# Patient Record
Sex: Male | Born: 1945 | Race: White | Hispanic: No | Marital: Married | Smoking: Former smoker
Health system: Southern US, Community
[De-identification: ages and names within clinical notes are randomized; demographics above are authoritative.]

## PROBLEM LIST (undated history)

## (undated) DIAGNOSIS — L719 Rosacea, unspecified: Secondary | ICD-10-CM

## (undated) DIAGNOSIS — R251 Tremor, unspecified: Secondary | ICD-10-CM

## (undated) DIAGNOSIS — H5440 Blindness, one eye, unspecified eye: Secondary | ICD-10-CM

## (undated) DIAGNOSIS — K648 Other hemorrhoids: Secondary | ICD-10-CM

## (undated) DIAGNOSIS — D126 Benign neoplasm of colon, unspecified: Secondary | ICD-10-CM

## (undated) DIAGNOSIS — F32A Depression, unspecified: Secondary | ICD-10-CM

## (undated) DIAGNOSIS — M1990 Unspecified osteoarthritis, unspecified site: Secondary | ICD-10-CM

## (undated) DIAGNOSIS — J449 Chronic obstructive pulmonary disease, unspecified: Secondary | ICD-10-CM

## (undated) DIAGNOSIS — Z5189 Encounter for other specified aftercare: Secondary | ICD-10-CM

## (undated) DIAGNOSIS — T7840XA Allergy, unspecified, initial encounter: Secondary | ICD-10-CM

## (undated) DIAGNOSIS — F329 Major depressive disorder, single episode, unspecified: Secondary | ICD-10-CM

## (undated) DIAGNOSIS — I4892 Unspecified atrial flutter: Secondary | ICD-10-CM

## (undated) DIAGNOSIS — D509 Iron deficiency anemia, unspecified: Secondary | ICD-10-CM

## (undated) DIAGNOSIS — H409 Unspecified glaucoma: Secondary | ICD-10-CM

## (undated) DIAGNOSIS — K5790 Diverticulosis of intestine, part unspecified, without perforation or abscess without bleeding: Secondary | ICD-10-CM

## (undated) DIAGNOSIS — E785 Hyperlipidemia, unspecified: Secondary | ICD-10-CM

## (undated) DIAGNOSIS — E119 Type 2 diabetes mellitus without complications: Secondary | ICD-10-CM

## (undated) DIAGNOSIS — C3490 Malignant neoplasm of unspecified part of unspecified bronchus or lung: Secondary | ICD-10-CM

## (undated) DIAGNOSIS — R0600 Dyspnea, unspecified: Secondary | ICD-10-CM

## (undated) DIAGNOSIS — G4700 Insomnia, unspecified: Secondary | ICD-10-CM

## (undated) DIAGNOSIS — G4733 Obstructive sleep apnea (adult) (pediatric): Secondary | ICD-10-CM

## (undated) DIAGNOSIS — K644 Residual hemorrhoidal skin tags: Secondary | ICD-10-CM

## (undated) DIAGNOSIS — IMO0001 Reserved for inherently not codable concepts without codable children: Secondary | ICD-10-CM

## (undated) DIAGNOSIS — N529 Male erectile dysfunction, unspecified: Secondary | ICD-10-CM

## (undated) DIAGNOSIS — I10 Essential (primary) hypertension: Secondary | ICD-10-CM

## (undated) HISTORY — DX: Encounter for other specified aftercare: Z51.89

## (undated) HISTORY — DX: Obstructive sleep apnea (adult) (pediatric): G47.33

## (undated) HISTORY — DX: Hyperlipidemia, unspecified: E78.5

## (undated) HISTORY — DX: Residual hemorrhoidal skin tags: K64.8

## (undated) HISTORY — DX: Type 2 diabetes mellitus without complications: E11.9

## (undated) HISTORY — DX: Blindness, one eye, unspecified eye: H54.40

## (undated) HISTORY — DX: Unspecified osteoarthritis, unspecified site: M19.90

## (undated) HISTORY — DX: Unspecified glaucoma: H40.9

## (undated) HISTORY — DX: Diverticulosis of intestine, part unspecified, without perforation or abscess without bleeding: K57.90

## (undated) HISTORY — PX: CLAVICLE SURGERY: SHX598

## (undated) HISTORY — DX: Benign neoplasm of colon, unspecified: D12.6

## (undated) HISTORY — DX: Major depressive disorder, single episode, unspecified: F32.9

## (undated) HISTORY — DX: Rosacea, unspecified: L71.9

## (undated) HISTORY — DX: Insomnia, unspecified: G47.00

## (undated) HISTORY — DX: Iron deficiency anemia, unspecified: D50.9

## (undated) HISTORY — PX: TONSILLECTOMY: SUR1361

## (undated) HISTORY — DX: Depression, unspecified: F32.A

## (undated) HISTORY — DX: Allergy, unspecified, initial encounter: T78.40XA

## (undated) HISTORY — DX: Chronic obstructive pulmonary disease, unspecified: J44.9

## (undated) HISTORY — DX: Essential (primary) hypertension: I10

## (undated) HISTORY — PX: THYROID CYST EXCISION: SHX2511

## (undated) HISTORY — DX: Male erectile dysfunction, unspecified: N52.9

## (undated) HISTORY — PX: CHOLECYSTECTOMY: SHX55

## (undated) HISTORY — DX: Residual hemorrhoidal skin tags: K64.4

## (undated) HISTORY — DX: Reserved for inherently not codable concepts without codable children: IMO0001

## (undated) HISTORY — DX: Tremor, unspecified: R25.1

## (undated) HISTORY — PX: ARTHROSCOPIC REPAIR ACL: SUR80

## (undated) MED FILL — Dexamethasone Sodium Phosphate Inj 100 MG/10ML: INTRAMUSCULAR | Qty: 1 | Status: AC

---

## 1994-02-24 HISTORY — PX: KNEE ARTHROSCOPY: SUR90

## 1999-01-03 ENCOUNTER — Encounter: Payer: Self-pay | Admitting: Family Medicine

## 1999-01-03 ENCOUNTER — Encounter: Admission: RE | Admit: 1999-01-03 | Discharge: 1999-01-03 | Payer: Self-pay | Admitting: Family Medicine

## 1999-01-24 ENCOUNTER — Encounter: Payer: Self-pay | Admitting: Urology

## 1999-01-24 ENCOUNTER — Ambulatory Visit (HOSPITAL_COMMUNITY): Admission: RE | Admit: 1999-01-24 | Discharge: 1999-01-24 | Payer: Self-pay | Admitting: Urology

## 2001-01-22 ENCOUNTER — Encounter: Admission: RE | Admit: 2001-01-22 | Discharge: 2001-01-22 | Payer: Self-pay | Admitting: Family Medicine

## 2001-01-22 ENCOUNTER — Encounter: Payer: Self-pay | Admitting: Family Medicine

## 2001-03-19 ENCOUNTER — Ambulatory Visit (HOSPITAL_COMMUNITY): Admission: RE | Admit: 2001-03-19 | Discharge: 2001-03-19 | Payer: Self-pay | Admitting: Gastroenterology

## 2001-03-19 ENCOUNTER — Encounter: Payer: Self-pay | Admitting: Gastroenterology

## 2001-11-16 ENCOUNTER — Inpatient Hospital Stay (HOSPITAL_COMMUNITY): Admission: EM | Admit: 2001-11-16 | Discharge: 2001-11-19 | Payer: Self-pay | Admitting: Emergency Medicine

## 2001-11-16 ENCOUNTER — Encounter: Payer: Self-pay | Admitting: Internal Medicine

## 2001-11-17 ENCOUNTER — Encounter: Payer: Self-pay | Admitting: Cardiology

## 2001-11-18 ENCOUNTER — Encounter: Payer: Self-pay | Admitting: Internal Medicine

## 2001-11-18 ENCOUNTER — Encounter (INDEPENDENT_AMBULATORY_CARE_PROVIDER_SITE_OTHER): Payer: Self-pay | Admitting: Specialist

## 2001-11-19 ENCOUNTER — Encounter: Payer: Self-pay | Admitting: Internal Medicine

## 2002-01-12 ENCOUNTER — Encounter: Payer: Self-pay | Admitting: Cardiology

## 2002-01-12 ENCOUNTER — Ambulatory Visit (HOSPITAL_COMMUNITY): Admission: RE | Admit: 2002-01-12 | Discharge: 2002-01-12 | Payer: Self-pay | Admitting: Cardiology

## 2002-05-13 ENCOUNTER — Ambulatory Visit (HOSPITAL_COMMUNITY): Admission: RE | Admit: 2002-05-13 | Discharge: 2002-05-13 | Payer: Self-pay | Admitting: Gastroenterology

## 2002-05-13 ENCOUNTER — Encounter: Payer: Self-pay | Admitting: Gastroenterology

## 2002-11-22 ENCOUNTER — Encounter: Admission: RE | Admit: 2002-11-22 | Discharge: 2002-11-22 | Payer: Self-pay | Admitting: Family Medicine

## 2002-11-22 ENCOUNTER — Encounter: Payer: Self-pay | Admitting: Family Medicine

## 2004-01-24 ENCOUNTER — Ambulatory Visit: Payer: Self-pay | Admitting: Gastroenterology

## 2004-06-13 ENCOUNTER — Ambulatory Visit: Payer: Self-pay | Admitting: Gastroenterology

## 2004-06-21 ENCOUNTER — Ambulatory Visit: Payer: Self-pay | Admitting: Gastroenterology

## 2004-07-04 ENCOUNTER — Ambulatory Visit: Payer: Self-pay | Admitting: Gastroenterology

## 2004-07-18 ENCOUNTER — Ambulatory Visit (HOSPITAL_COMMUNITY): Admission: RE | Admit: 2004-07-18 | Discharge: 2004-07-18 | Payer: Self-pay | Admitting: Gastroenterology

## 2004-07-18 ENCOUNTER — Ambulatory Visit: Payer: Self-pay | Admitting: Gastroenterology

## 2004-08-07 ENCOUNTER — Ambulatory Visit: Payer: Self-pay | Admitting: Gastroenterology

## 2004-08-20 ENCOUNTER — Encounter: Admission: RE | Admit: 2004-08-20 | Discharge: 2004-08-20 | Payer: Self-pay | Admitting: Family Medicine

## 2004-08-20 IMAGING — CT CT ABDOMEN W/ CM
1 of 2 series · 14 of 32 positions shown, 19 images · IV contrast (GASTRO & WATER & [ID] OMNI 300)
Comparison: [DATE].

CLINICAL DATA: Left abdominal pain for three weeks.  Recent fall.  Question muscle strain.
ABDOMEN CT WITH CONTRAST:
TECHNIQUE: Multidetector CT imaging of the abdomen was performed following the standard protocol with oral contrast and during bolus administration of intravenous contrast.
Contrast:  Oral contrast and 125 cc Omnipaque 300

[Series 2: abd w/ · axial · 0.78mm/px · z∈[-373,-88]mm · 14 of 65 slices shown, 19 images]
[im 4/65  soft-tissue]
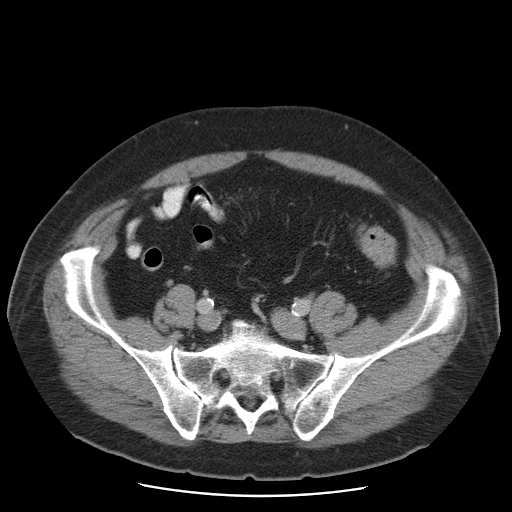
[im 4/65  bone]
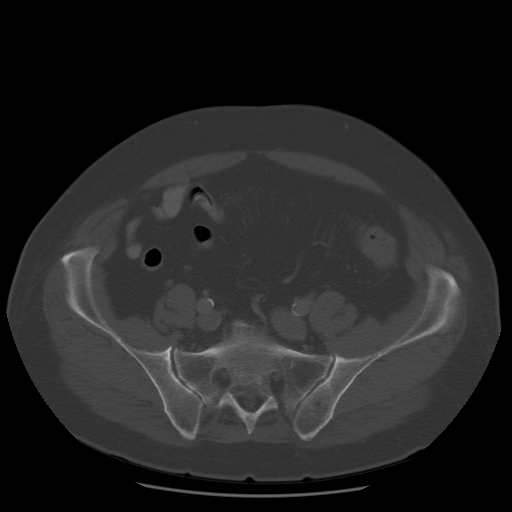
[im 10/65  soft-tissue]
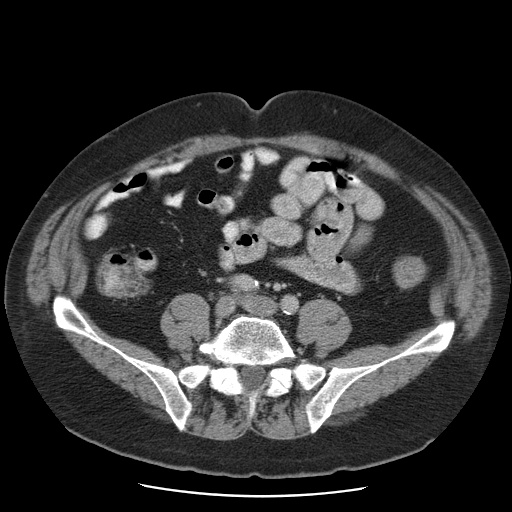
[im 13/65  soft-tissue]
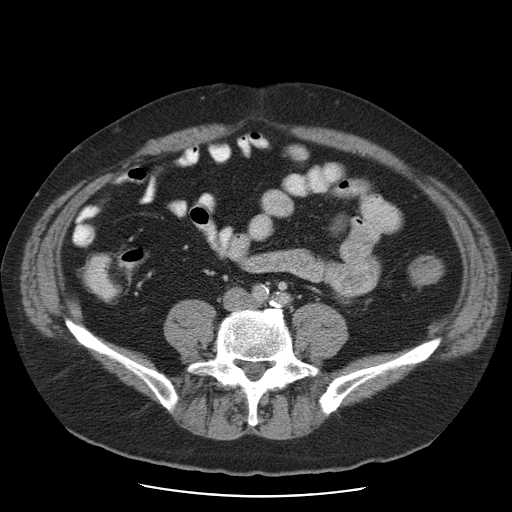
[im 20/65  soft-tissue]
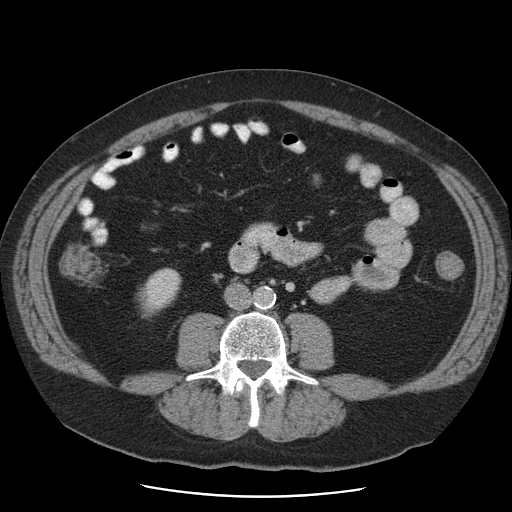
[im 23/65  soft-tissue]
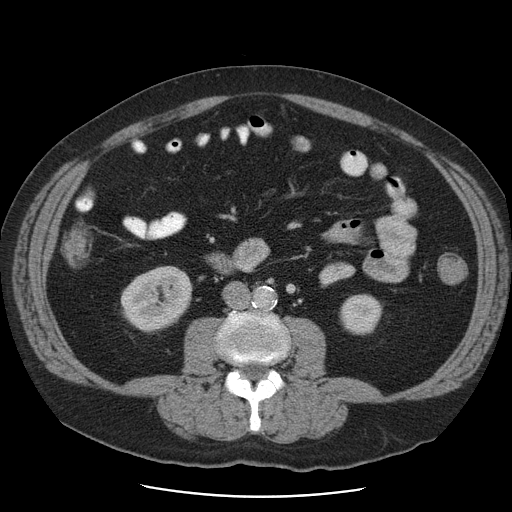
[im 29/65  soft-tissue]
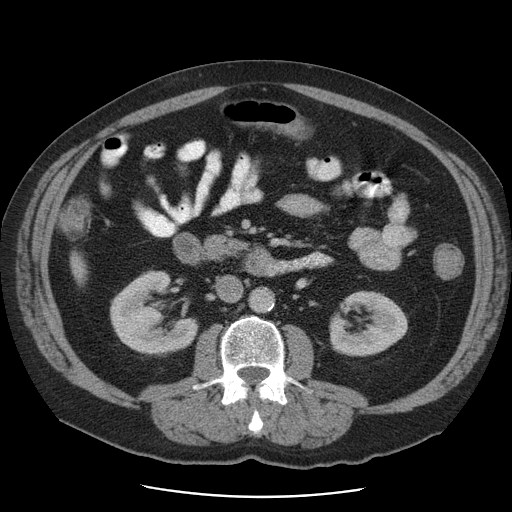
[im 33/65  soft-tissue]
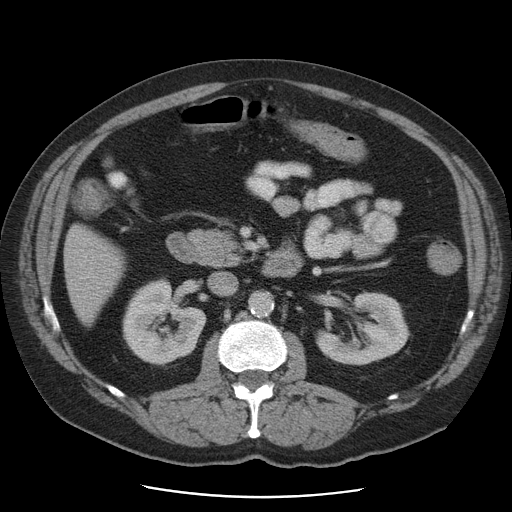
[im 36/65  soft-tissue]
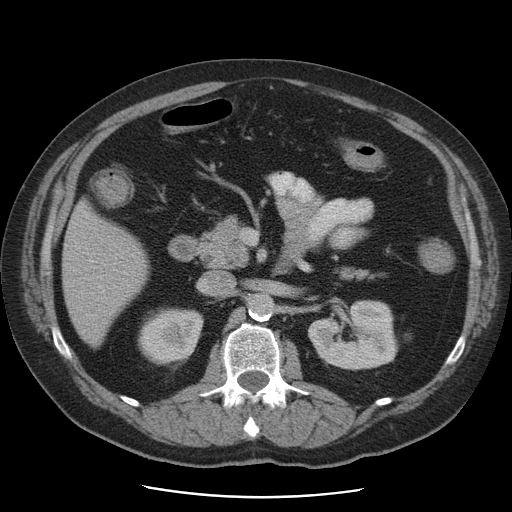
[im 42/65  soft-tissue]
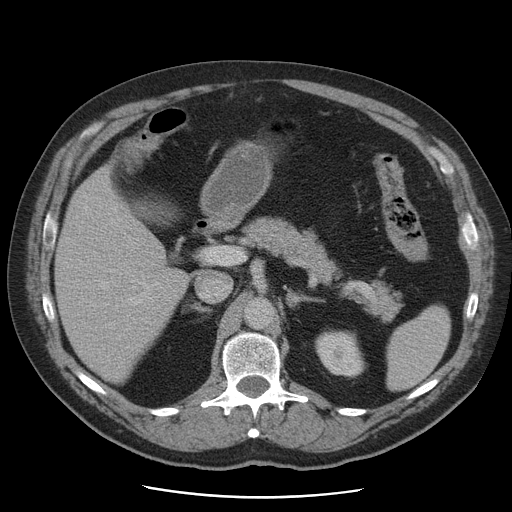
[im 42/65  bone]
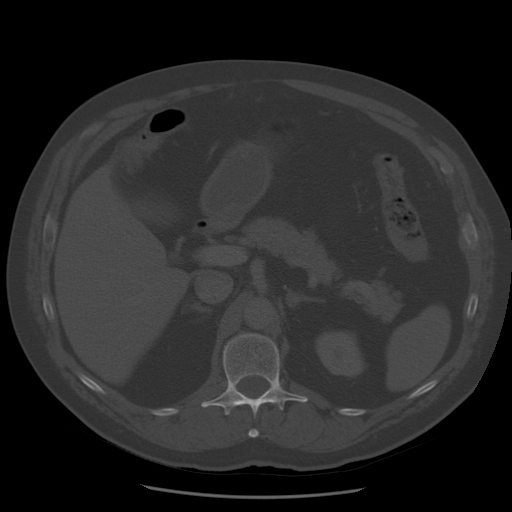
[im 45/65  soft-tissue]
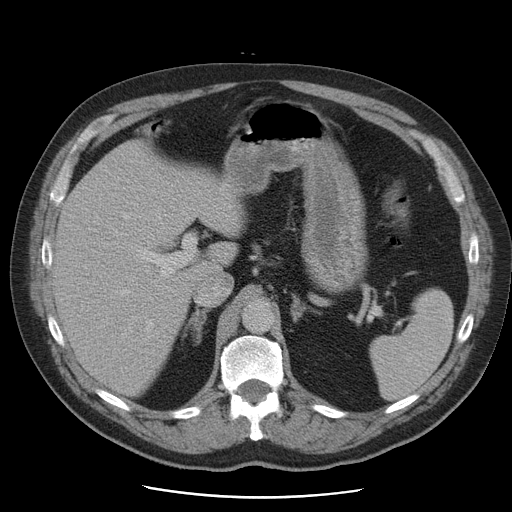
[im 52/65  soft-tissue]
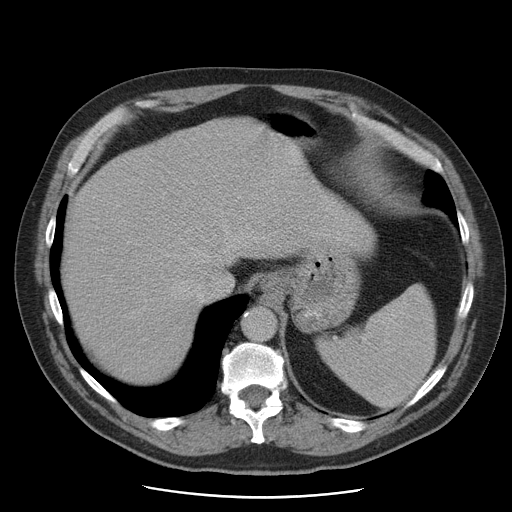
[im 52/65  lung]
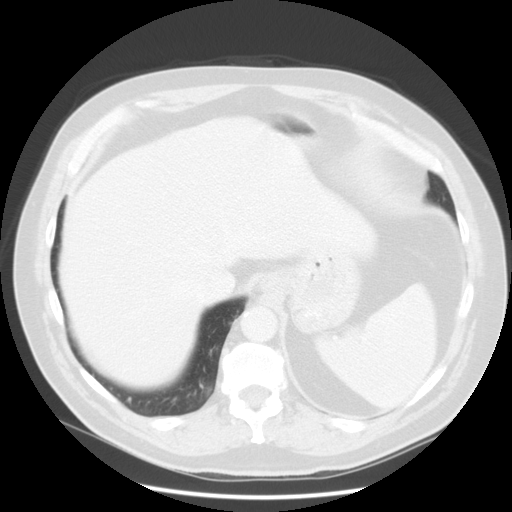
[im 55/65  soft-tissue]
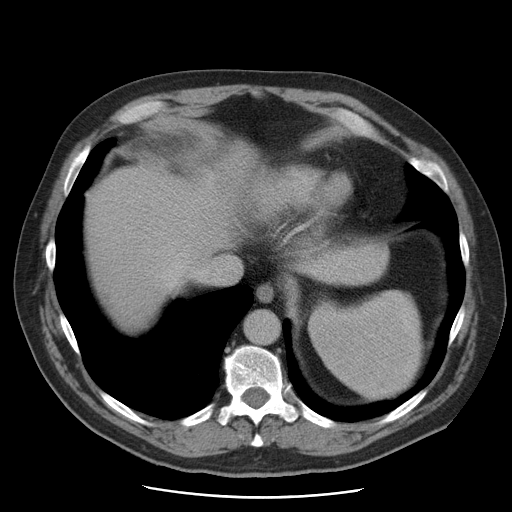
[im 55/65  lung]
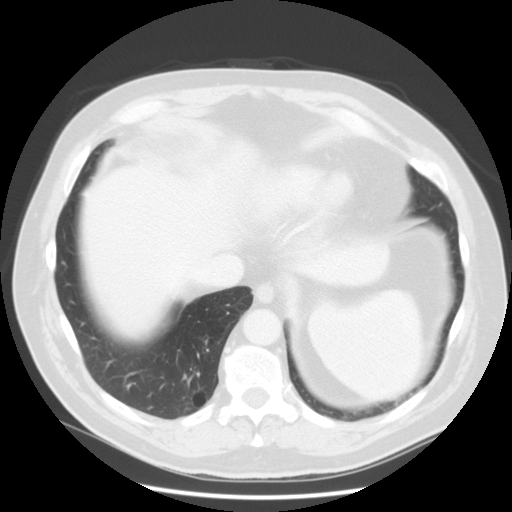
[im 58/65  lung]
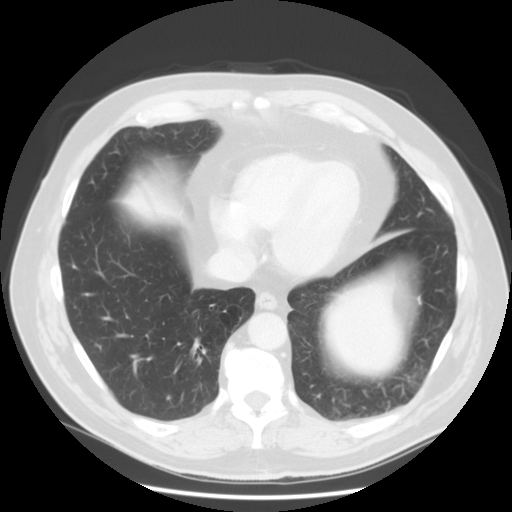
[im 61/65  soft-tissue]
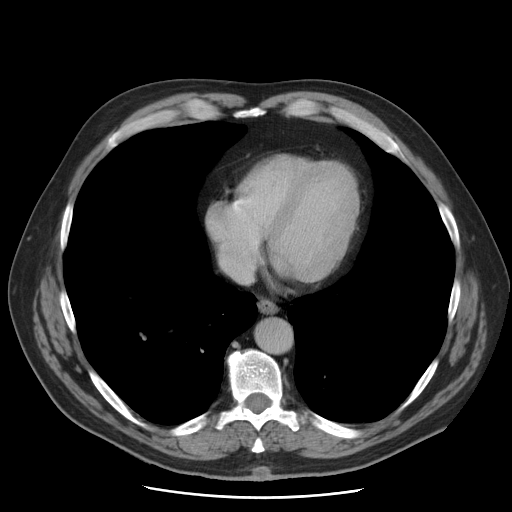
[im 61/65  lung]
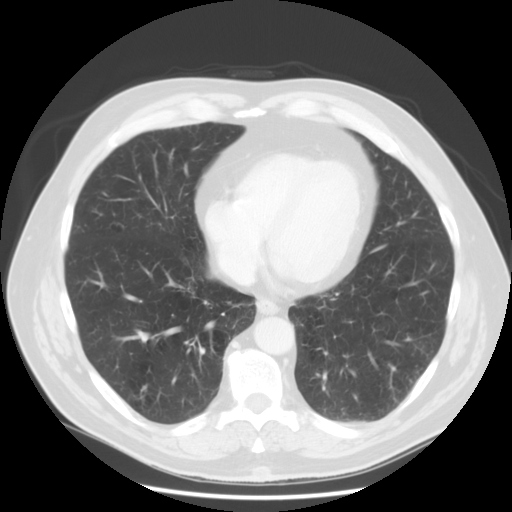

[14 of 32 positions shown; findings below may reference images not displayed]

FINDINGS: The lung bases are clear.  There is no pleural effusion.  Small bilateral adrenal adenomas are stable from the prior exam.  There is no biliary dilatation status-post cholecystectomy.  The liver, spleen, pancreas, and kidneys appear stable with a small cortical cyst projecting laterally from the midleft kidney, measuring 1.7 cm in diameter.  There is no adenopathy or ascites.  No anterior abdominal wall hematoma or intraabdominal inflammatory change is seen.  Images extend into the false pelvis and demonstrate diverticular changes of the sigmoid colon without surrounding inflammation.  Moderate lumbar spine degenerative changes are noted.
IMPRESSION: 1.  No definite explanation for left abdominal pain is seen.  There is sigmoid diverticulosis without evidence of acute inflammation.  
2.  Stable bilateral adrenal adenomas and small left renal cortical cyst.

## 2004-09-20 ENCOUNTER — Ambulatory Visit: Payer: Self-pay | Admitting: Gastroenterology

## 2004-11-27 ENCOUNTER — Ambulatory Visit: Payer: Self-pay | Admitting: Gastroenterology

## 2005-01-03 ENCOUNTER — Ambulatory Visit: Payer: Self-pay | Admitting: Gastroenterology

## 2006-02-09 ENCOUNTER — Ambulatory Visit: Payer: Self-pay | Admitting: Cardiology

## 2006-02-09 ENCOUNTER — Observation Stay (HOSPITAL_COMMUNITY): Admission: EM | Admit: 2006-02-09 | Discharge: 2006-02-10 | Payer: Self-pay | Admitting: Emergency Medicine

## 2006-02-09 IMAGING — CR DG CHEST 1V PORT
1 series · 1 of 1 positions shown · non-contrast
Comparison: None available.

CLINICAL DATA: Chest pain, smoker, shortness of breath. 
 PORTABLE CHEST - 1 VIEW ? [DATE]:

[view not recorded]
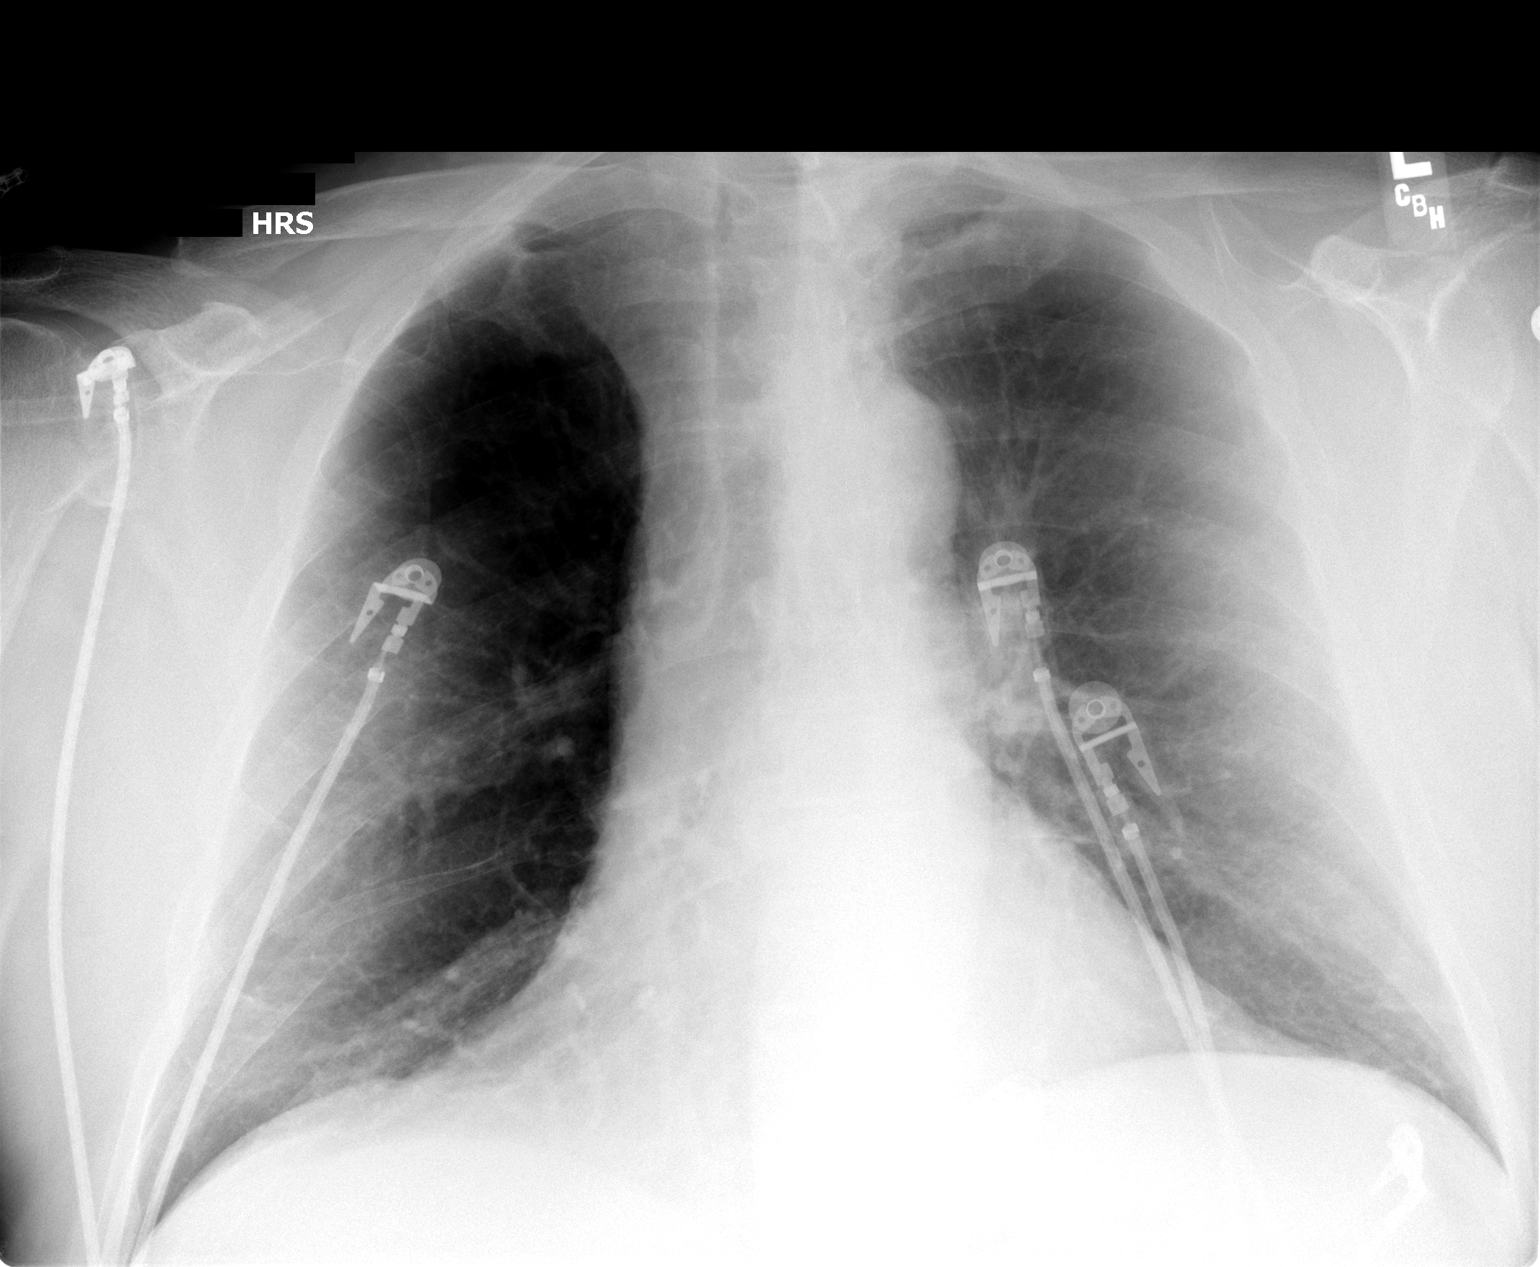

[1 of 1 positions shown; findings below may reference images not displayed]

FINDINGS: The trachea is midline.  Heart size is within normal limits.  COPD.  Prominent vessels are seen in the right mid lung zone.  The lungs are otherwise clear.
IMPRESSION: No acute findings.

## 2006-02-10 ENCOUNTER — Encounter: Payer: Self-pay | Admitting: Cardiology

## 2006-02-24 HISTORY — PX: PARTIAL COLECTOMY: SHX5273

## 2006-03-09 ENCOUNTER — Ambulatory Visit: Payer: Self-pay | Admitting: Cardiology

## 2006-07-16 ENCOUNTER — Ambulatory Visit: Payer: Self-pay | Admitting: Gastroenterology

## 2006-07-30 ENCOUNTER — Ambulatory Visit: Payer: Self-pay | Admitting: Gastroenterology

## 2006-07-30 ENCOUNTER — Encounter (INDEPENDENT_AMBULATORY_CARE_PROVIDER_SITE_OTHER): Payer: Self-pay | Admitting: Gastroenterology

## 2006-09-24 ENCOUNTER — Ambulatory Visit: Payer: Self-pay | Admitting: Gastroenterology

## 2006-10-06 ENCOUNTER — Ambulatory Visit: Payer: Self-pay | Admitting: Gastroenterology

## 2006-11-24 ENCOUNTER — Ambulatory Visit: Admission: RE | Admit: 2006-11-24 | Discharge: 2006-11-24 | Payer: Self-pay | Admitting: *Deleted

## 2006-11-27 ENCOUNTER — Encounter (INDEPENDENT_AMBULATORY_CARE_PROVIDER_SITE_OTHER): Payer: Self-pay | Admitting: *Deleted

## 2006-11-27 ENCOUNTER — Inpatient Hospital Stay (HOSPITAL_COMMUNITY): Admission: RE | Admit: 2006-11-27 | Discharge: 2006-12-04 | Payer: Self-pay | Admitting: Orthopedic Surgery

## 2006-12-03 IMAGING — CR DG CHEST 2V
2 series · 2 of 2 positions shown · non-contrast
Comparison: [DATE].

CLINICAL DATA: 61 year-old-male with left colon mass, abdominal pain, partial colectomy [DATE].  History of smoking. 
CHEST - 2 VIEW ? [DATE]:

[w chest pa]
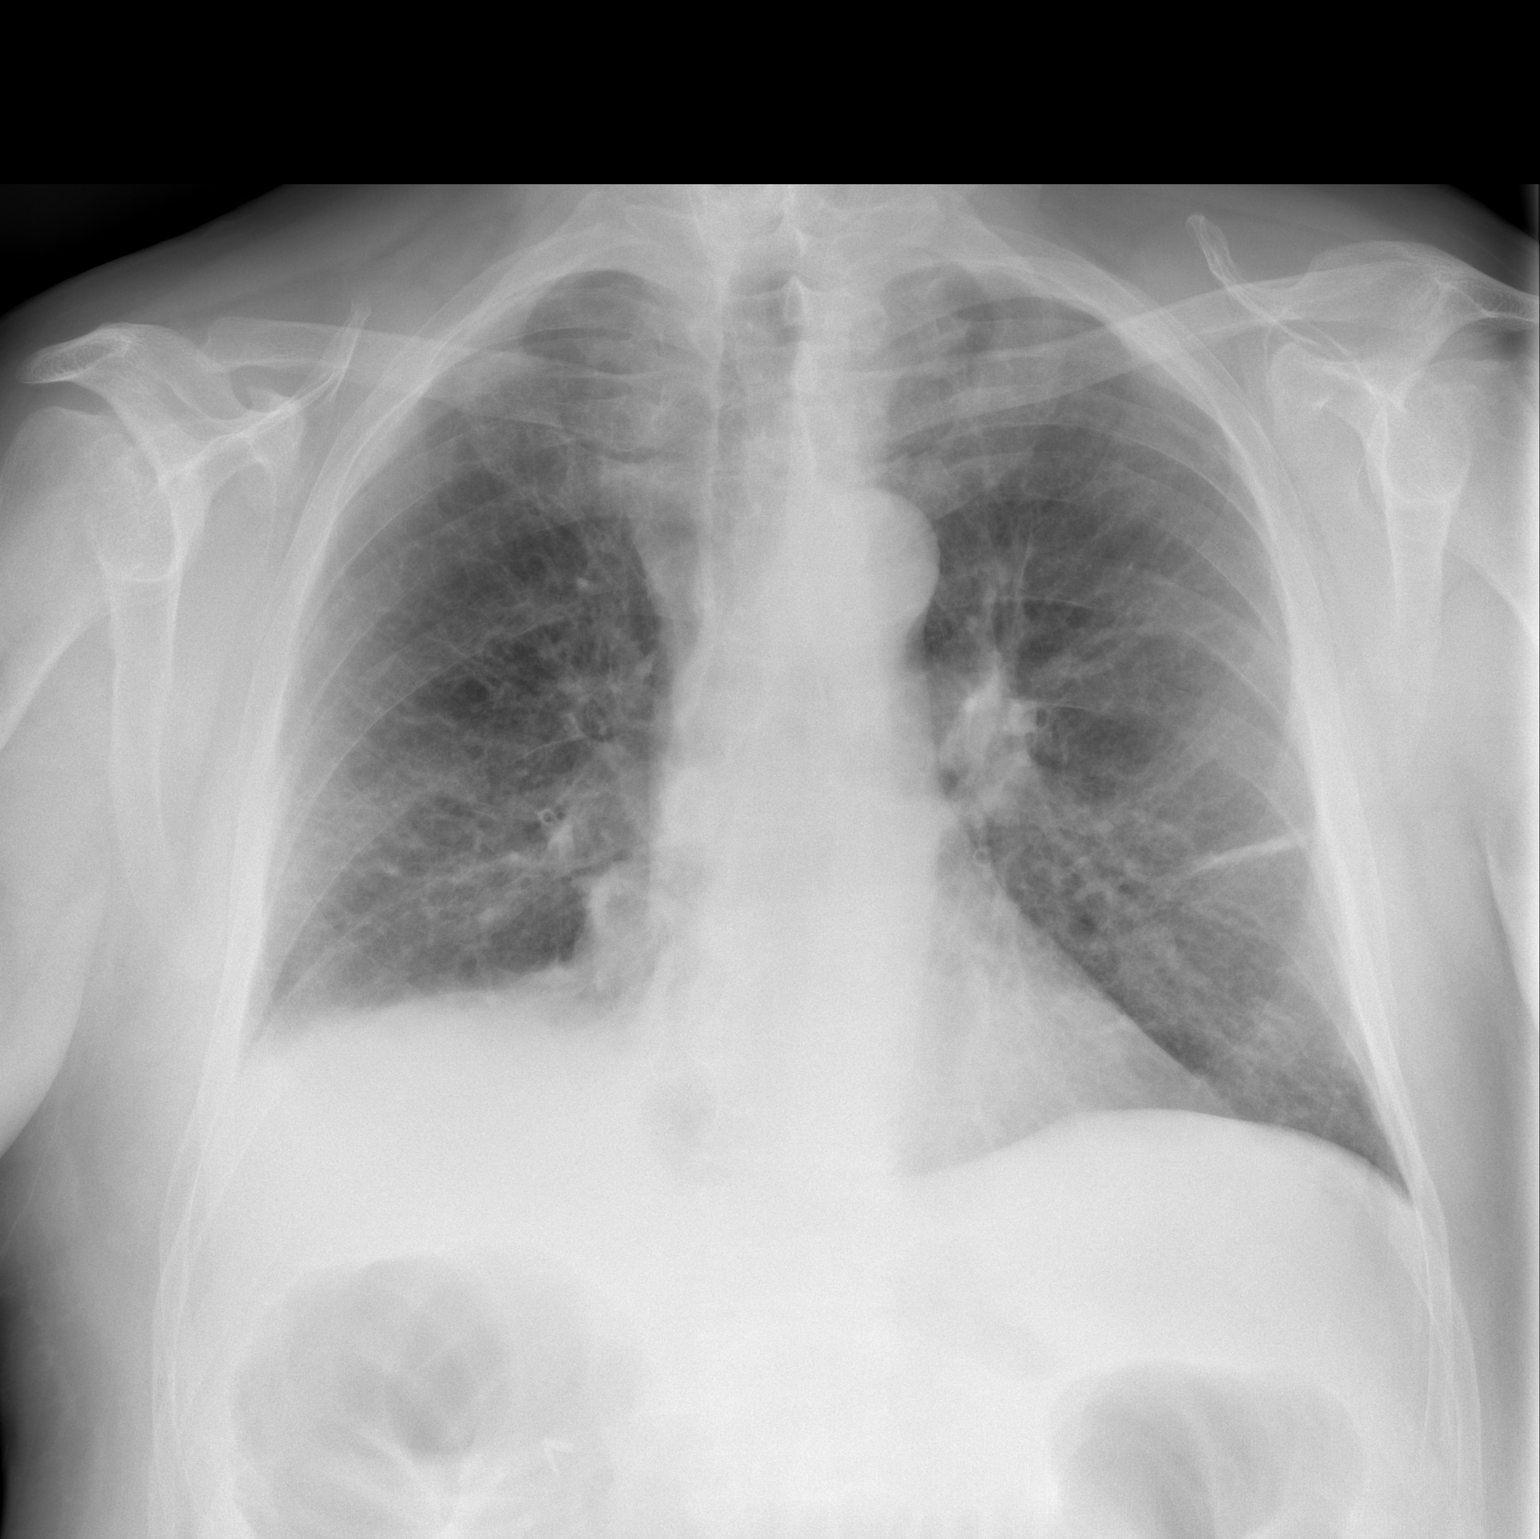

[w chest lat *]
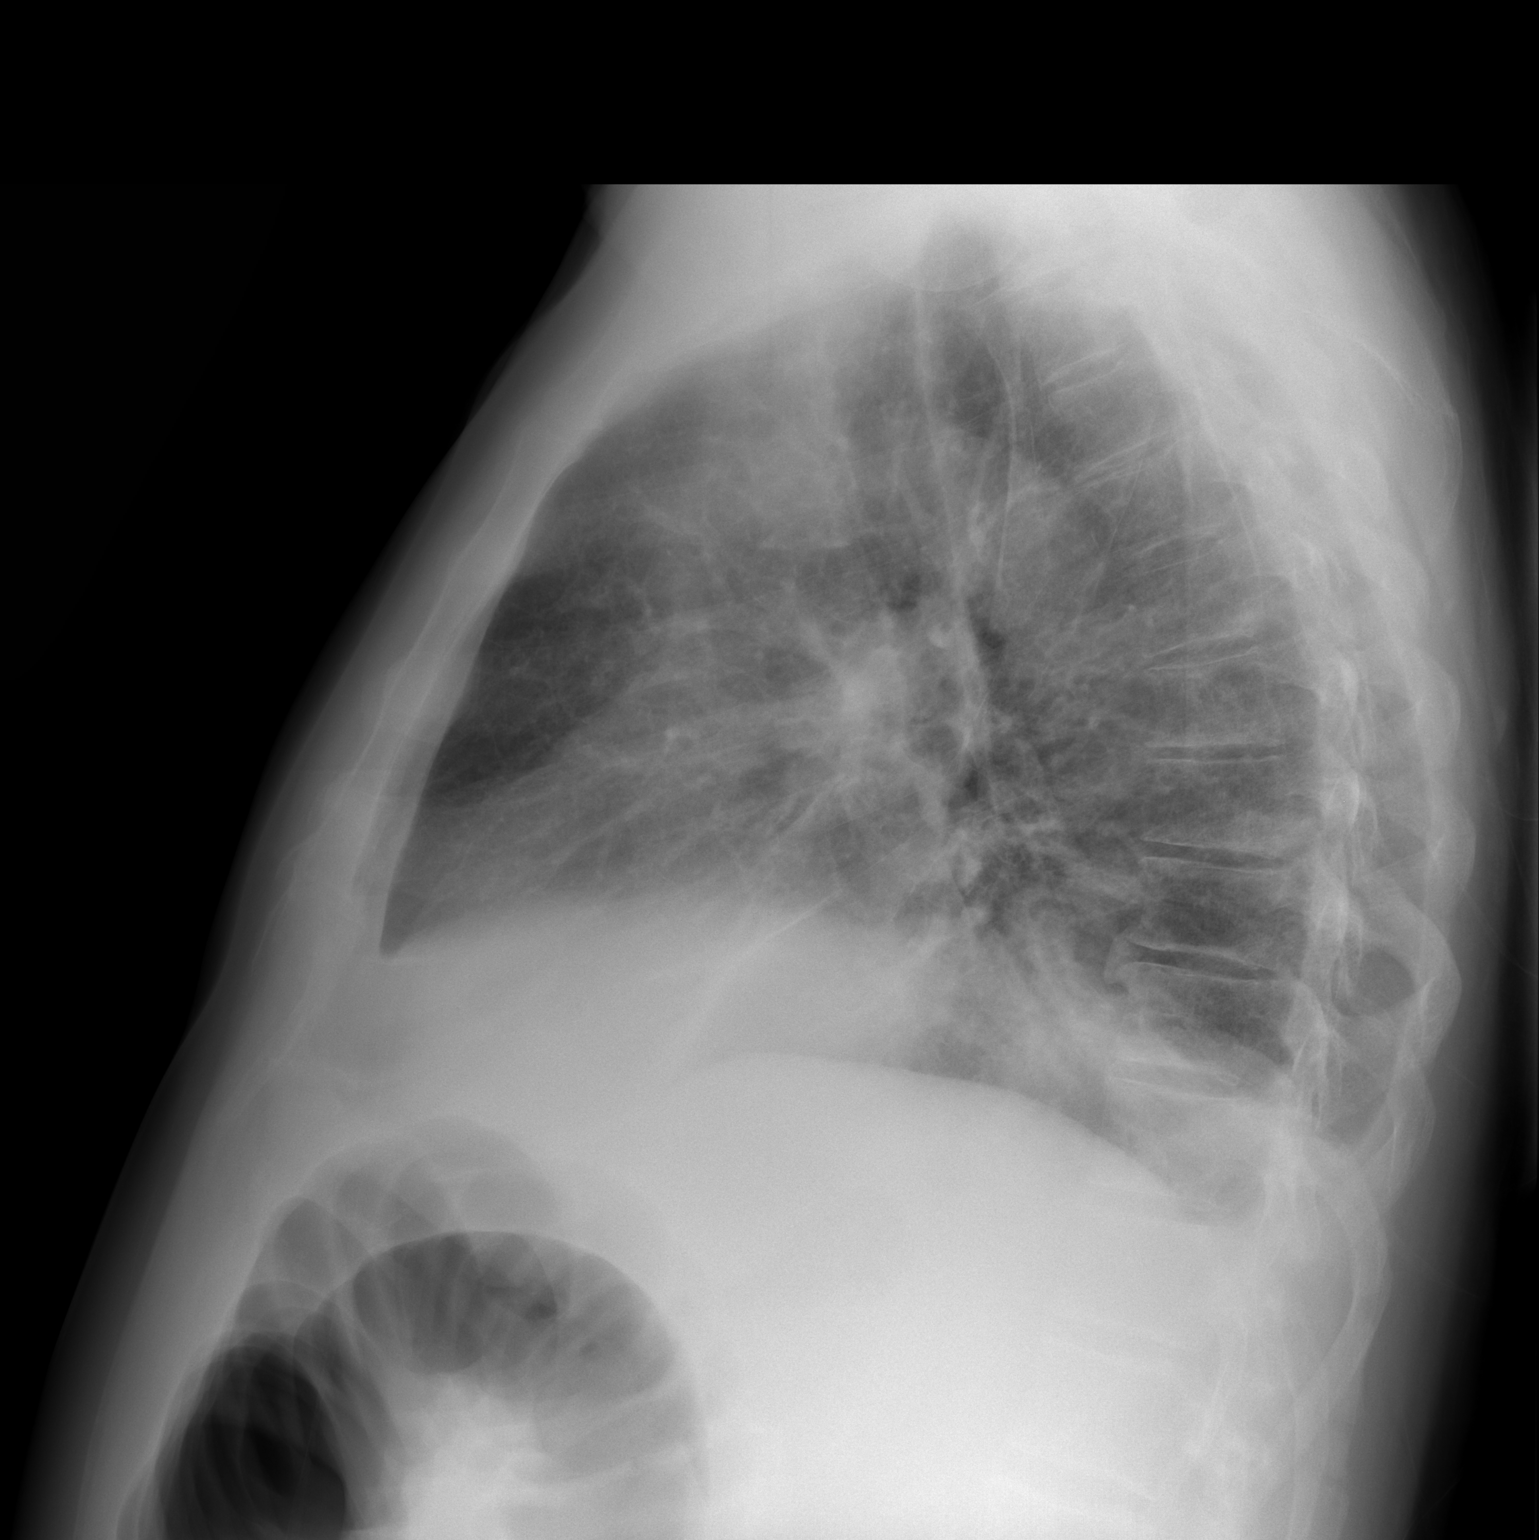

[2 of 2 positions shown; findings below may reference images not displayed]

FINDINGS: Slightly decreased lung volumes.  Background chronic bronchial thickening and COPD type changes are identified.  Left midlung subsegmental atelectasis.  Hazy density of the left lung base likely represents atelectasis with an elevated right hemidiaphragm.  No pneumothorax.  A small right effusion is not excluded. Distended bowel is present in the upper abdomen.
IMPRESSION: 1.  Low volume exam with COPD changes and chronic bronchial thickening. 
2.  Left mid lung and right lower lobe atelectasis.

## 2007-02-01 ENCOUNTER — Ambulatory Visit: Payer: Self-pay | Admitting: Gastroenterology

## 2007-02-02 ENCOUNTER — Ambulatory Visit: Payer: Self-pay | Admitting: Gastroenterology

## 2007-02-02 ENCOUNTER — Encounter: Payer: Self-pay | Admitting: Gastroenterology

## 2007-02-05 ENCOUNTER — Ambulatory Visit: Payer: Self-pay | Admitting: Internal Medicine

## 2007-02-05 IMAGING — CT CT ABDOMEN W/ CM
2 of 5 series · 17 of 46 positions shown, 19 images · IV contrast (Omnipaque 300)
Comparison: CT abdomen of [DATE].

CLINICAL DATA: left lower quadrant pain for several days. Colon resection in [DATE] for a large polyp.
ABDOMEN CT WITH CONTRAST:
TECHNIQUE: Multidetector CT imaging of the abdomen was performed following the standard protocol during bolus administration of intravenous contrast.
Contrast:  125 cc Omnipaque 300
TECHNIQUE: Multidetector CT imaging of the pelvis was performed following the standard protocol during bolus administration of intravenous contrast.

[Series 2: abd_pel 5.0 b30f st · axial · 0.94mm/px · z∈[-476,-56]mm · 14 of 94 slices shown, 16 images]
[im 5/94  soft-tissue]
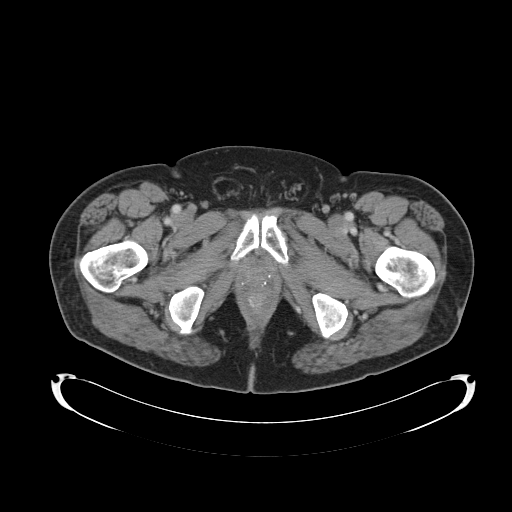
[im 5/94  bone]
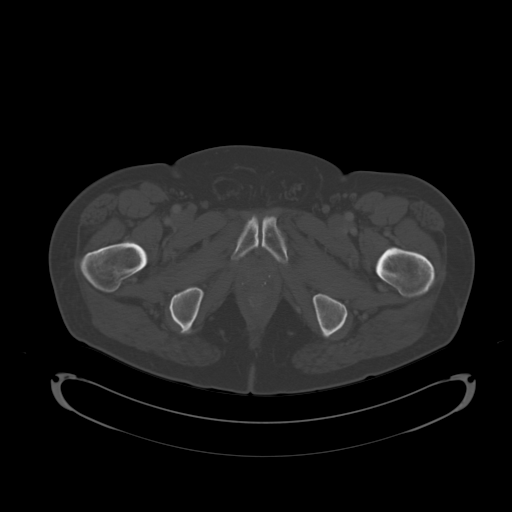
[im 14/94  soft-tissue]
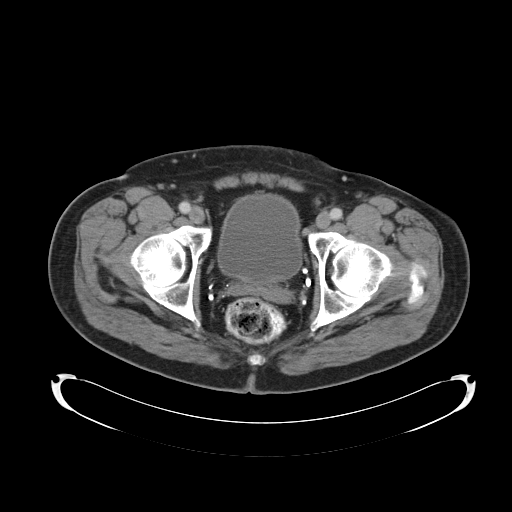
[im 19/94  soft-tissue]
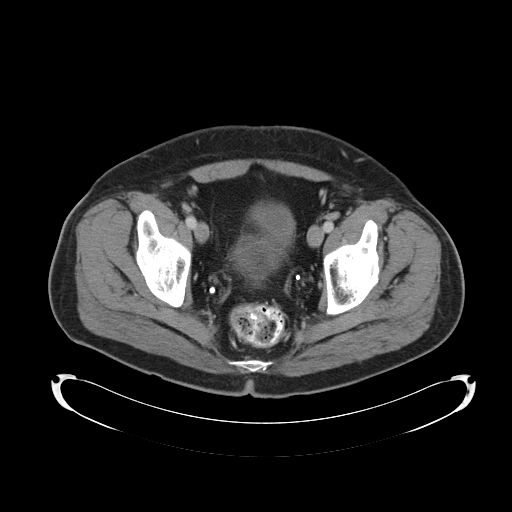
[im 24/94  soft-tissue]
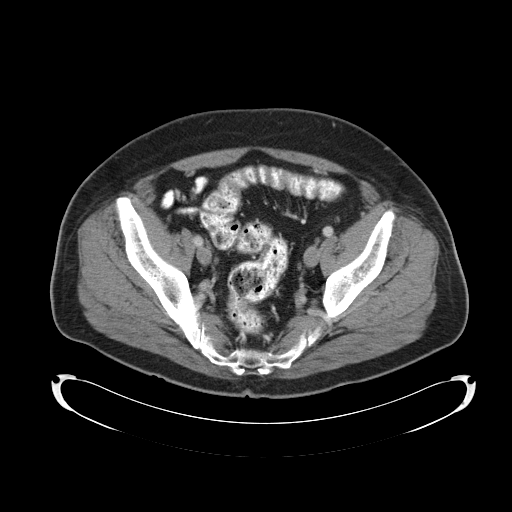
[im 33/94  soft-tissue]
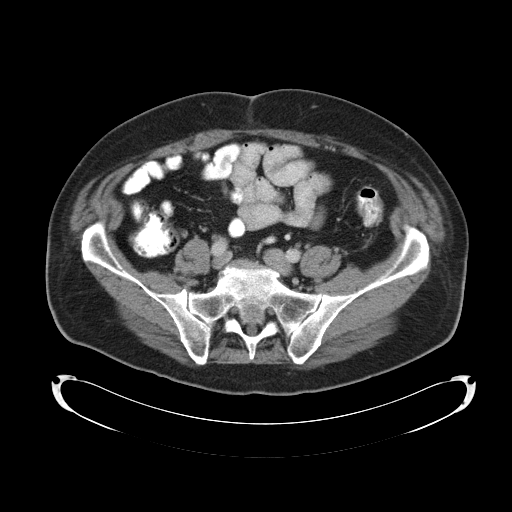
[im 38/94  soft-tissue]
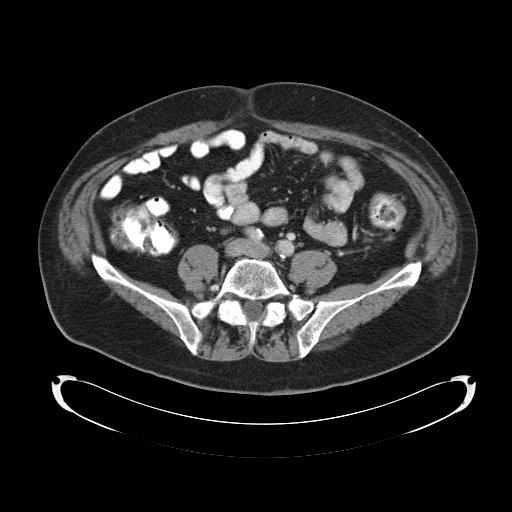
[im 42/94  soft-tissue]
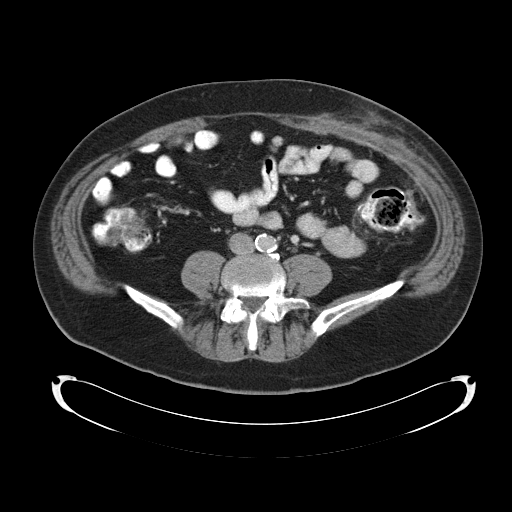
[im 52/94  soft-tissue]
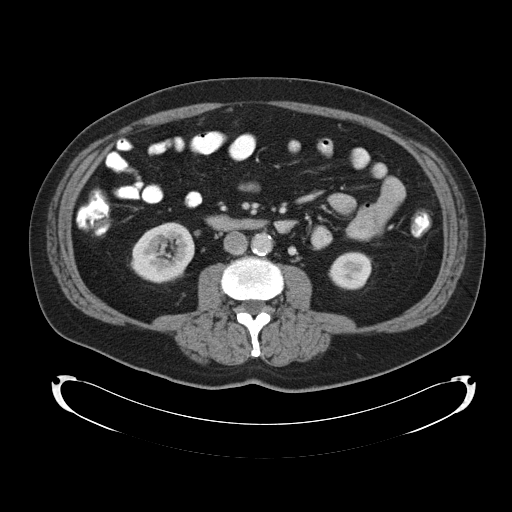
[im 56/94  soft-tissue]
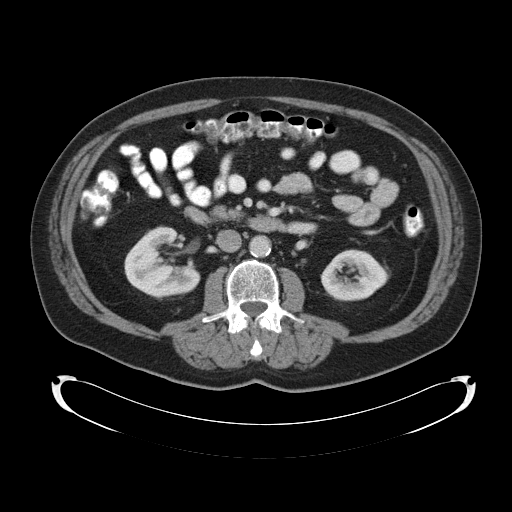
[im 56/94  bone]
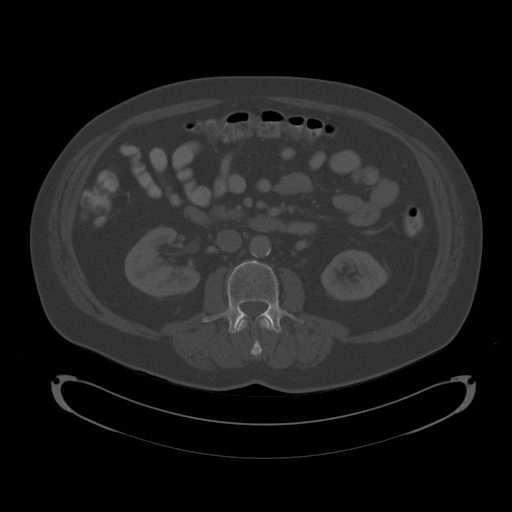
[im 61/94  soft-tissue]
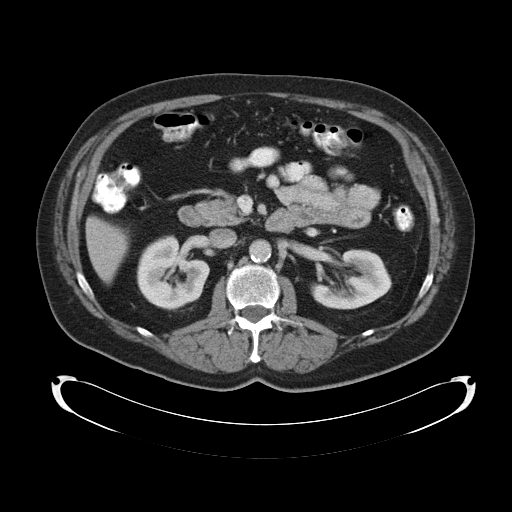
[im 70/94  soft-tissue]
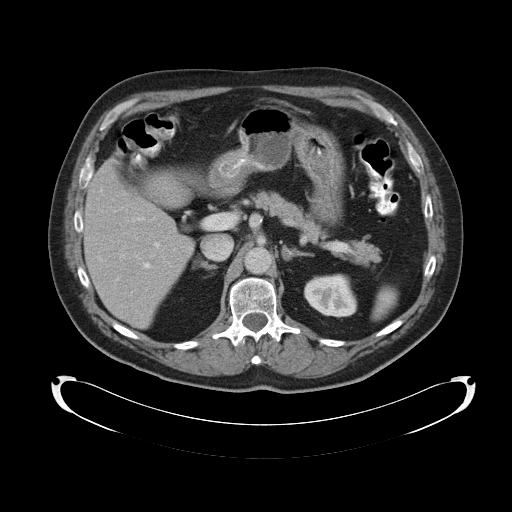
[im 75/94  soft-tissue]
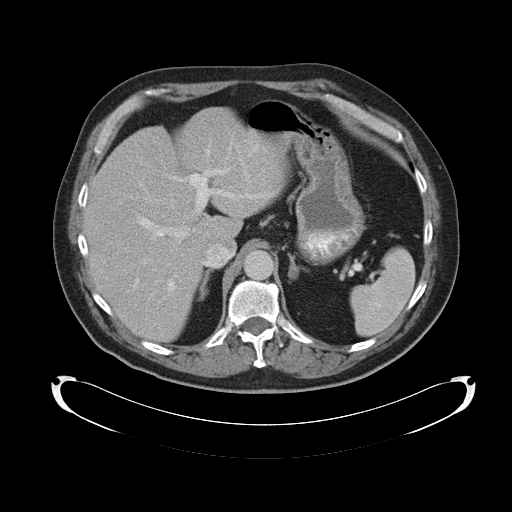
[im 80/94  soft-tissue]
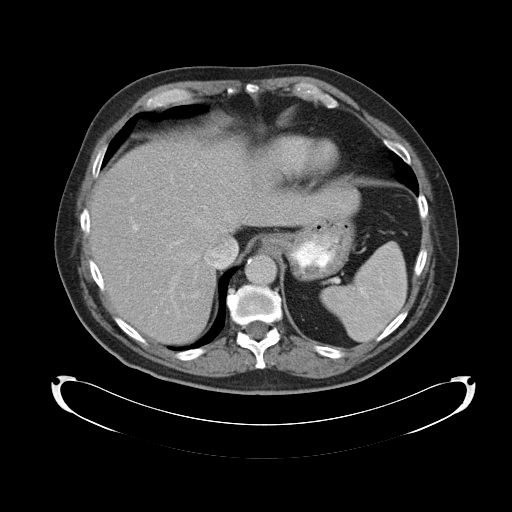
[im 89/94  soft-tissue]
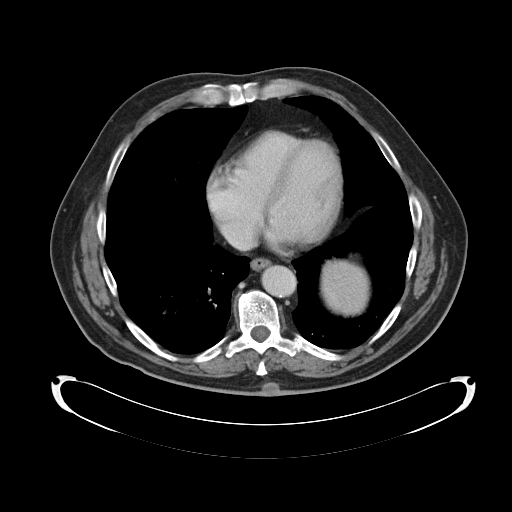

[Series 602: <mpr thick range> · coronal · 0.94mm/px · 3 of 110 slices shown]
[im 37/110  soft-tissue]
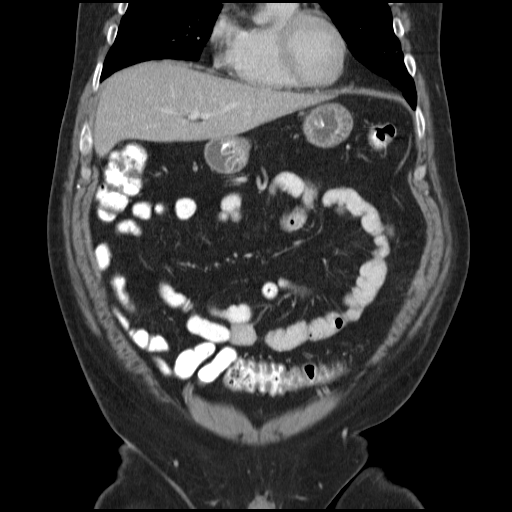
[im 49/110  soft-tissue]
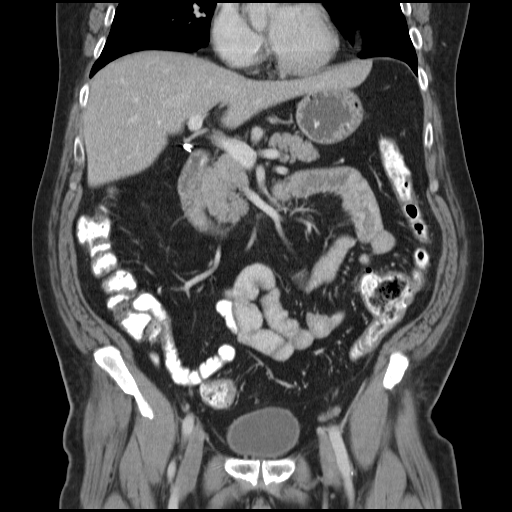
[im 61/110  soft-tissue]
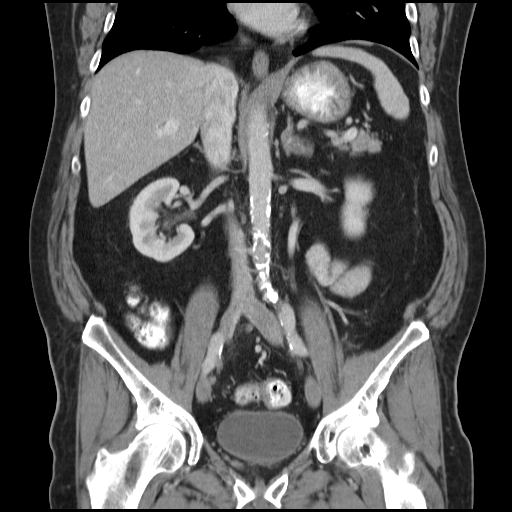

[17 of 46 positions shown; findings below may reference images not displayed]

FINDINGS: Chronic changes are noted at the lung bases.  The liver enhances with no focal abnormality and no ductal dilatation is seen.  Surgical clips are present from prior cholecystectomy.  The pancreas is stable in size and the pancreatic duct is not dilated. Probable incidental adrenal adenomas are stable. The spleen is unchanged in size.  The kidneys enhance and there is no change in exophytic cyst emanating from the posterior left kidney. A portion of the colon that is visualized on CT of the abdomen appears normal.
IMPRESSION: Stable CT abdomen.  No acute abnormality.
PELVIS CT WITH CONTRAST:
FINDINGS: There is pericolonic stranding inside the distal descending colon as it joins the sigmoid colon. This could potentially be all postoperative in nature with overlying soft tissue strandiness at that site as well. However, mild diverticulitis of the distal descending-proximal sigmoid colon cannot be excluded.  Remainder of the rectosigmoid colon appears normal. The urinary bladder is unremarkable.  The prostate is slightly prominent. No fluid is seen within the pelvis.
IMPRESSION: Some pericolonic strandiness is noted surrounding the distal descending colon apparently at the site of prior surgery .  This may all be postoperative in nature, but a mild diverticulitis at that site cannot be excluded.

## 2007-06-16 ENCOUNTER — Ambulatory Visit: Payer: Self-pay | Admitting: Internal Medicine

## 2007-06-16 DIAGNOSIS — J438 Other emphysema: Secondary | ICD-10-CM | POA: Insufficient documentation

## 2007-06-16 DIAGNOSIS — E663 Overweight: Secondary | ICD-10-CM | POA: Insufficient documentation

## 2007-06-17 DIAGNOSIS — J439 Emphysema, unspecified: Secondary | ICD-10-CM | POA: Insufficient documentation

## 2007-07-28 ENCOUNTER — Ambulatory Visit: Payer: Self-pay | Admitting: Internal Medicine

## 2007-07-28 IMAGING — CR DG CHEST 2V
2 series · 2 of 2 positions shown · non-contrast
Comparison: Chest x-ray of [DATE]

CLINICAL DATA: Short of breath, cough, smoking history

CHEST - 2 VIEW

[view not recorded (1 of 2)]
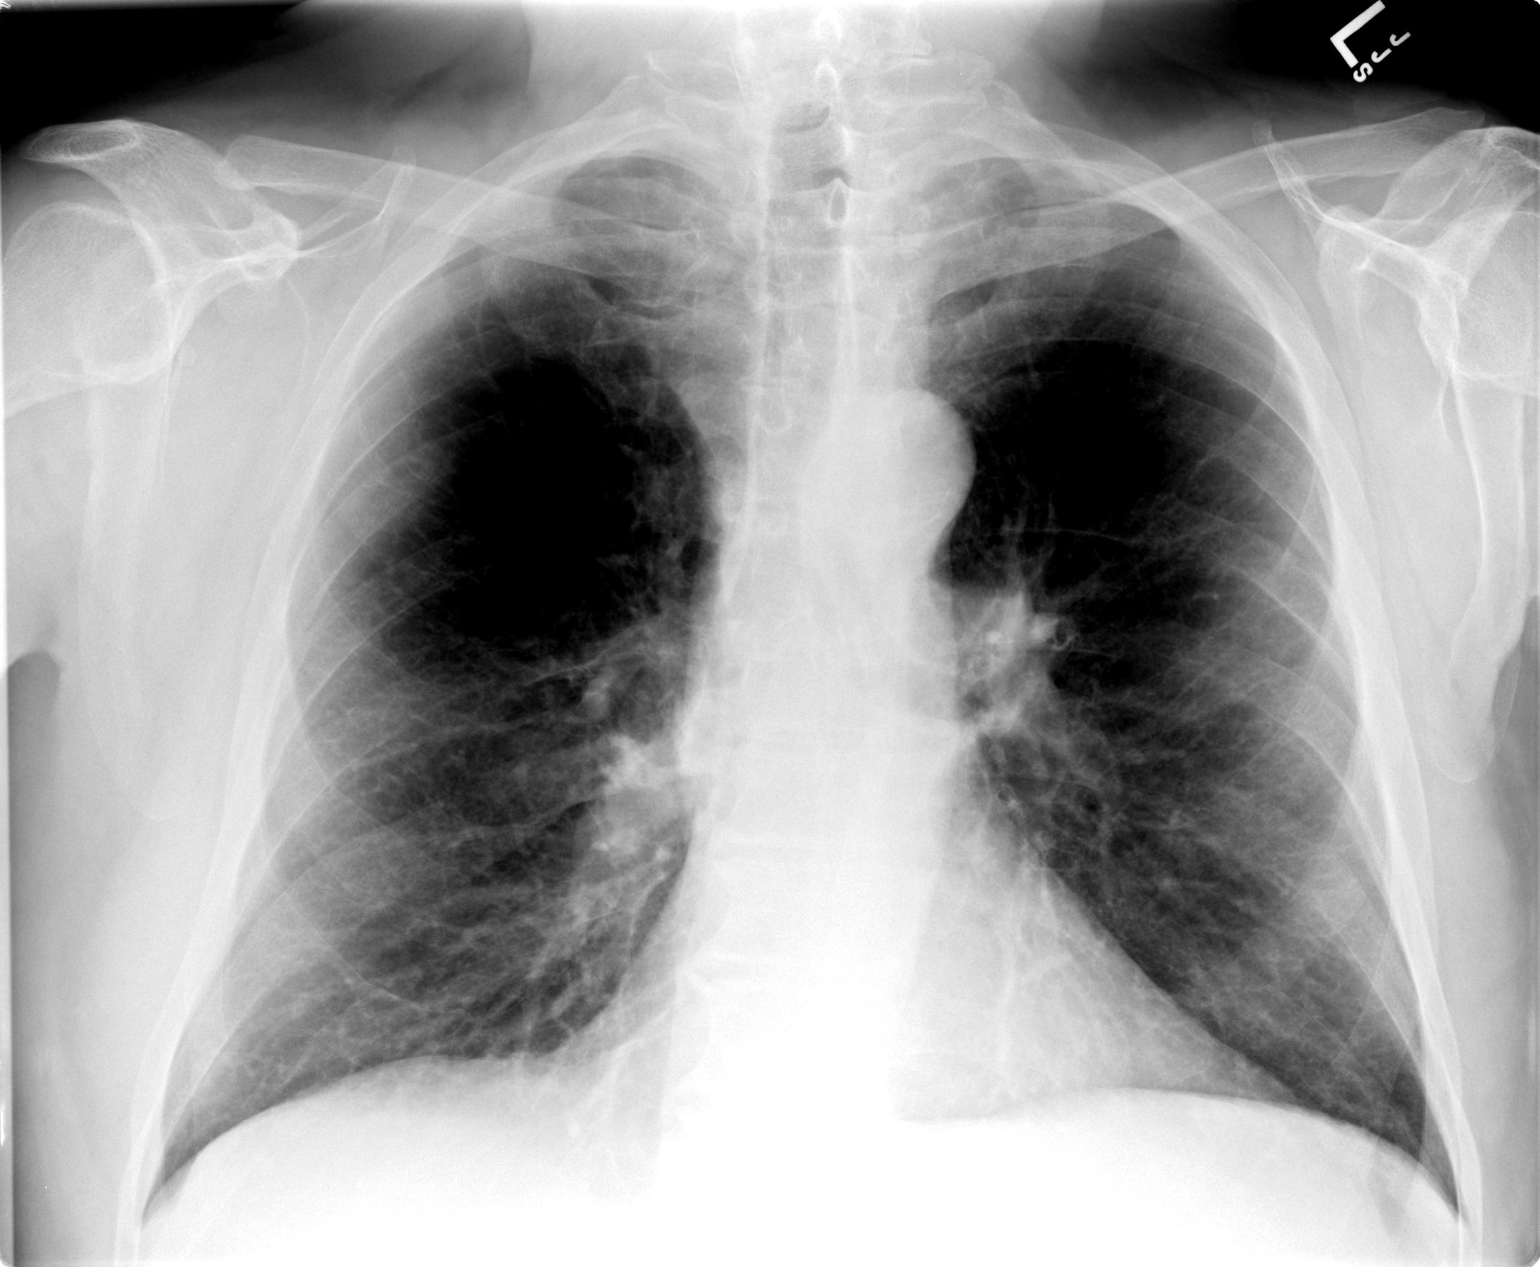

[view not recorded (2 of 2)]
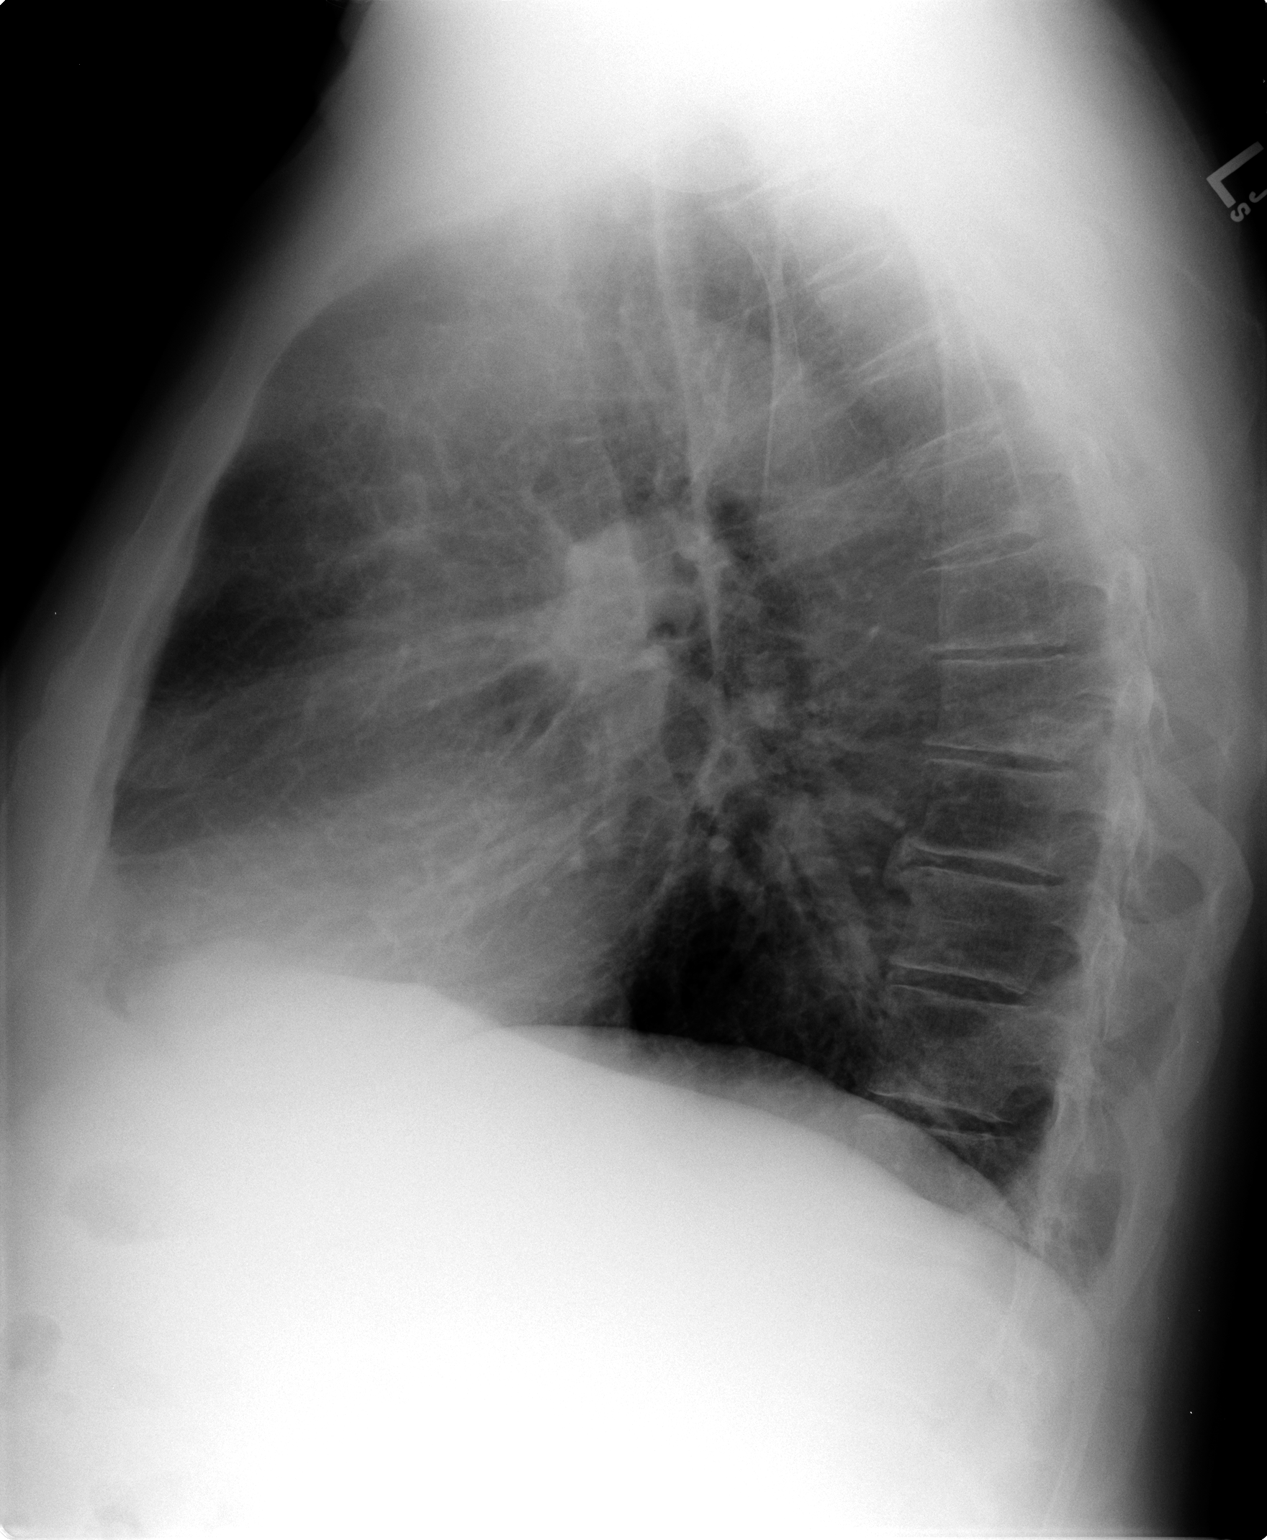

[2 of 2 positions shown; findings below may reference images not displayed]

FINDINGS: The lungs remain hyperaerated consistent with COPD.
Chronic bronchitis is again noted.  No active process is seen.  The
heart is within upper limits of normal.  No acute bony abnormality
is seen.
IMPRESSION: COPD and chronic bronchitis.  No active lung disease.

## 2007-07-29 ENCOUNTER — Encounter (INDEPENDENT_AMBULATORY_CARE_PROVIDER_SITE_OTHER): Payer: Self-pay | Admitting: *Deleted

## 2007-12-20 ENCOUNTER — Encounter: Admission: RE | Admit: 2007-12-20 | Discharge: 2007-12-20 | Payer: Self-pay | Admitting: Internal Medicine

## 2007-12-20 IMAGING — US US SOFT TISSUE HEAD/NECK
1 series · 14 of 25 positions shown · non-contrast
Comparison: None

CLINICAL DATA: Asymmetrical thyroid.

THYROID ULTRASOUND
TECHNIQUE: Ultrasound examination of the thyroid gland and
adjacent soft tissues was performed.

[Series 1: us soft tissue head/neck · 0.10mm/px · 14 of 76 slices shown]
[im 1/76]
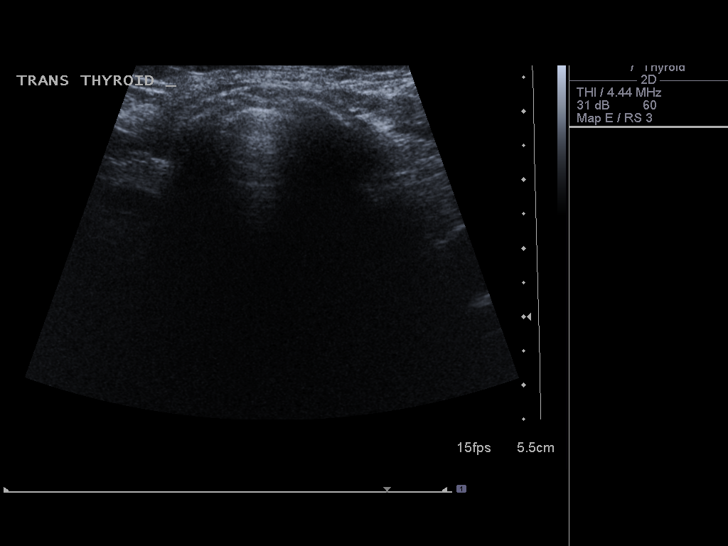
[im 7/76]
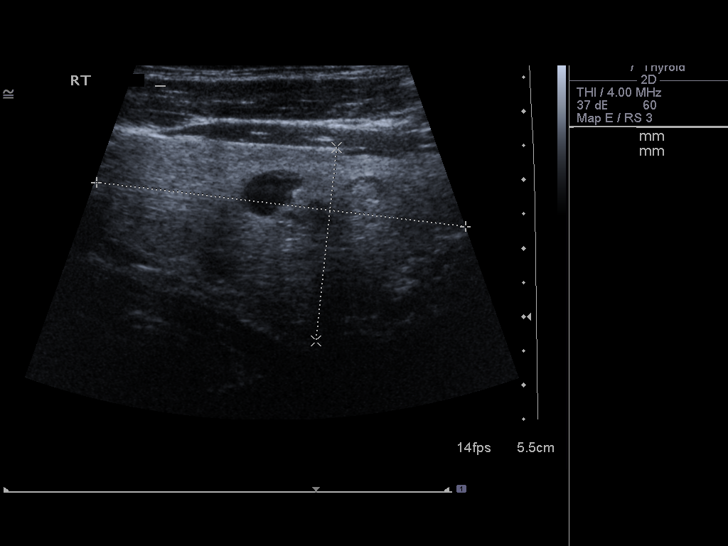
[im 13/76]
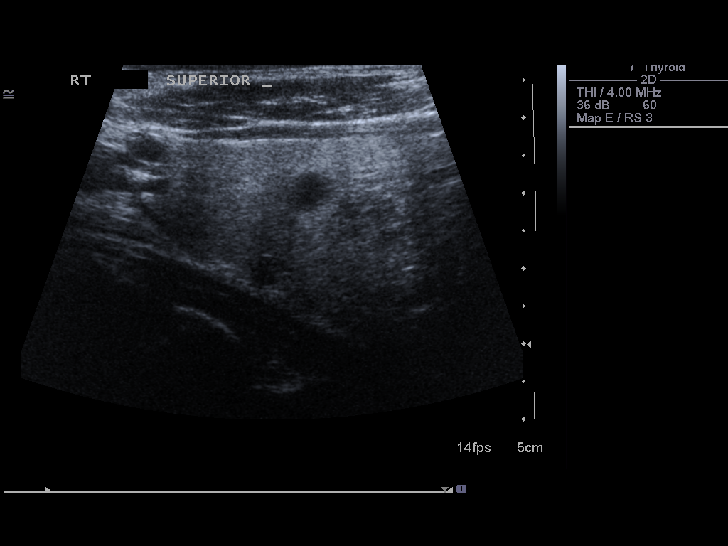
[im 19/76]
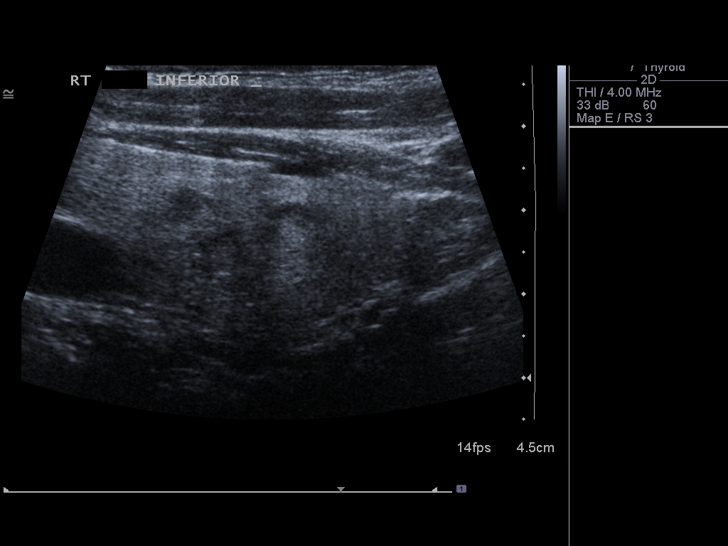
[im 26/76]
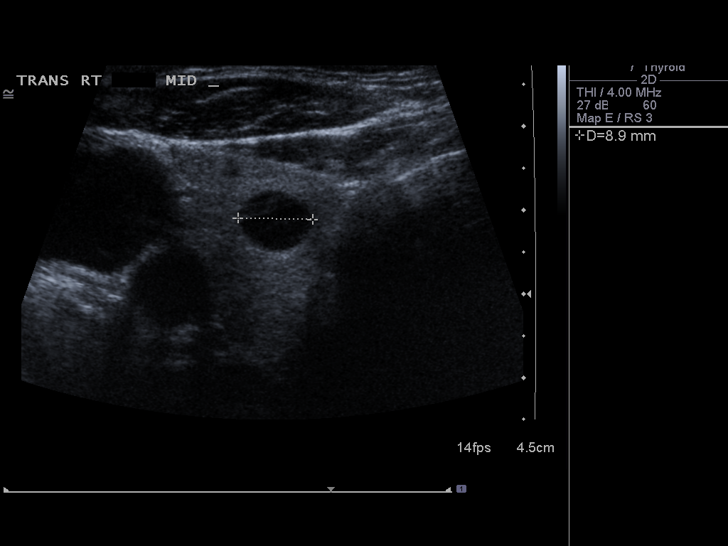
[im 29/76]
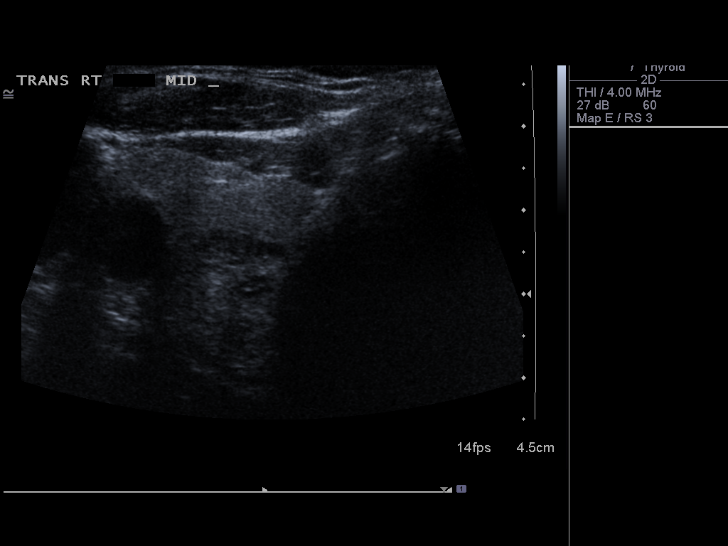
[im 35/76]
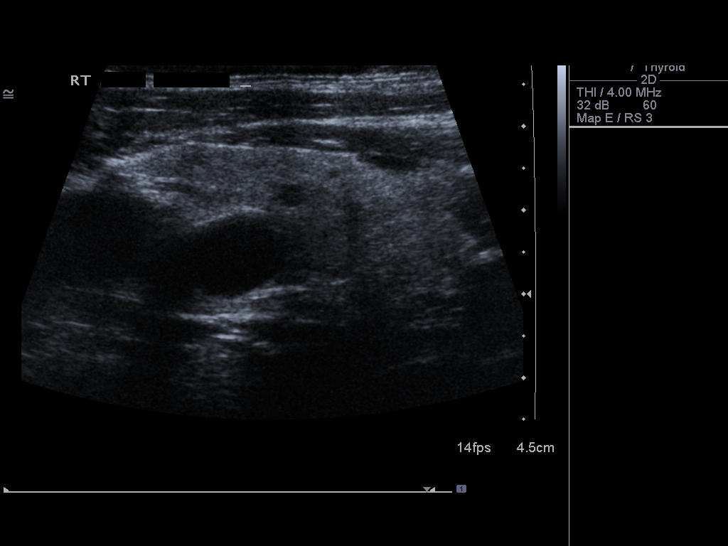
[im 41/76]
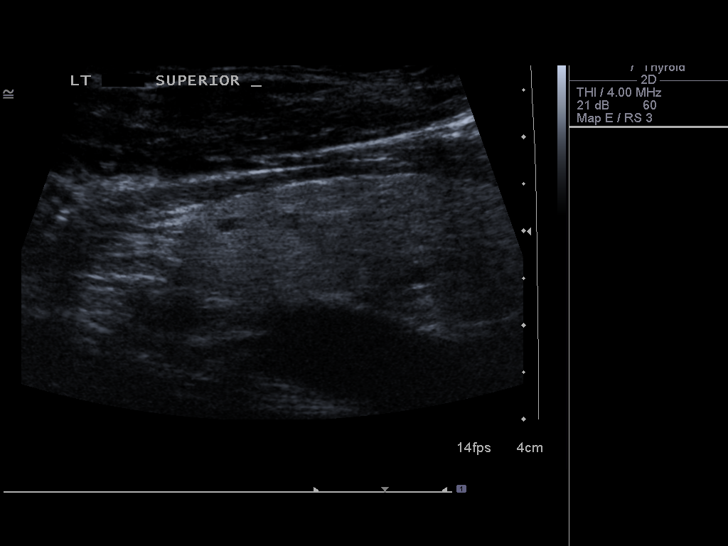
[im 47/76]
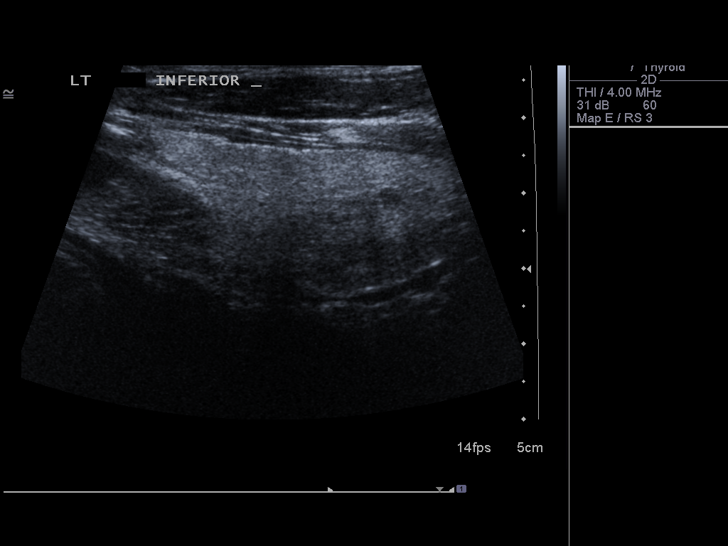
[im 51/76]
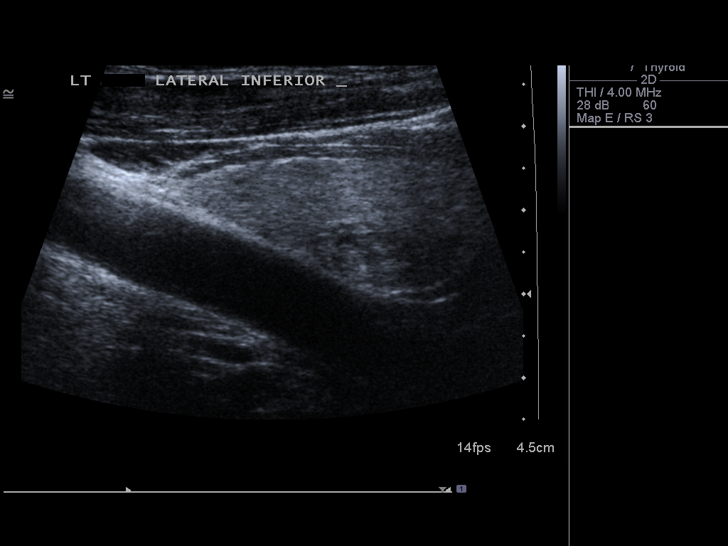
[im 57/76]
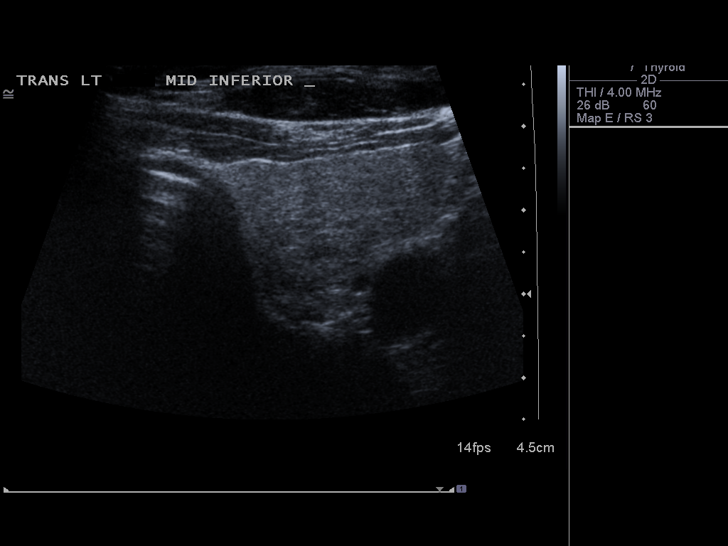
[im 63/76]
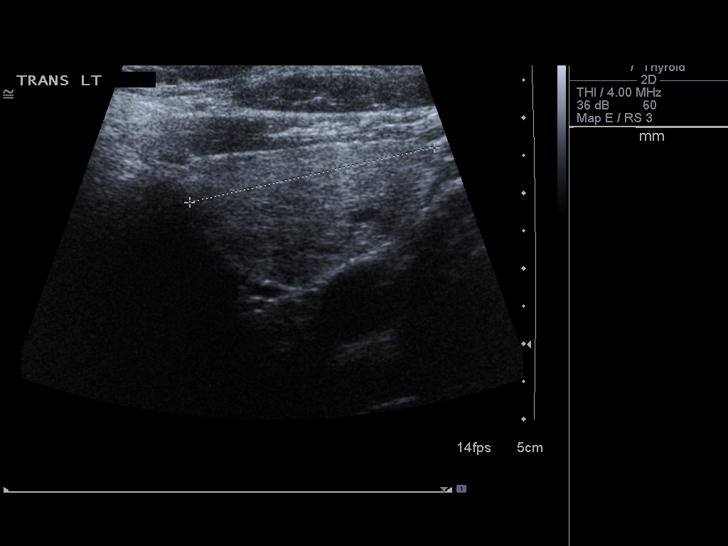
[im 69/76]
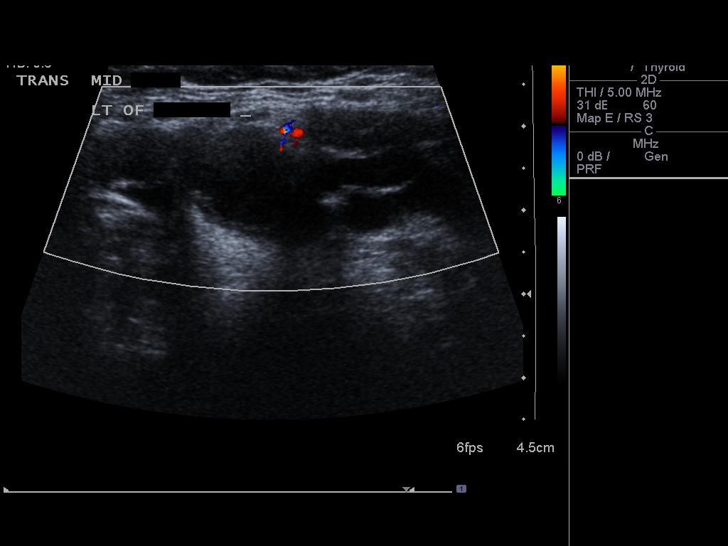
[im 76/76]
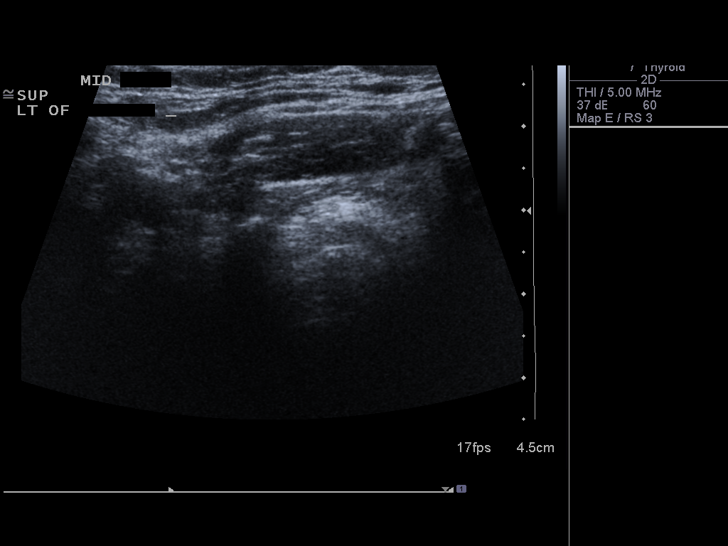

[14 of 25 positions shown; findings below may reference images not displayed]

FINDINGS: Right lobe of the thyroid measures 5.4 x 2.8 x 2.3 cm and
left lobe, 4.9 x 2.2 x 3.3 cm.  Isthmus measures 4 mm.  Thyroid
echotexture is heterogeneous.  Hypoechoic nodules are seen
bilaterally.  The largest on the right is seen in the lower pole
and measures 9 x 13 x 12 mm and on the left, 13 x 11 x 12 mm in the
lower pole.  A complex cystic lesion is seen in the low anterior
neck, to the left of midline, measuring 2.7 x 1.4 x 3.3 cm.
IMPRESSION: 1.  Solid bilateral thyroid nodules, with largest measurements as
above.  Follow-up could be performed in 6 months to ensure
stability, as clinically indicated.
2.  Complex cystic lesion in the low anterior neck, to the left of
midline.  Neck CT with contrast could be performed in further
evaluation, as clinically indicated.

## 2008-01-10 ENCOUNTER — Encounter: Admission: RE | Admit: 2008-01-10 | Discharge: 2008-01-10 | Payer: Self-pay | Admitting: Otolaryngology

## 2008-01-10 IMAGING — CT CT NECK W/ CM
3 of 4 series · 16 of 33 positions shown, 19 images · IV contrast (75CC OMNI 300)
Comparison: Ultrasound [DATE]

CLINICAL DATA: Neck mass demonstrated on thyroid ultrasound.

CT NECK WITH CONTRAST
TECHNIQUE: Multidetector CT imaging of the neck was performed with
intravenous contrast.
Contrast: 75 ml [D2]

[Series 3: soft tissue neck · axial · 0.41mm/px · z∈[+52,+246]mm · 8 of 66 slices shown, 10 images]
[im 7/66  soft-tissue]
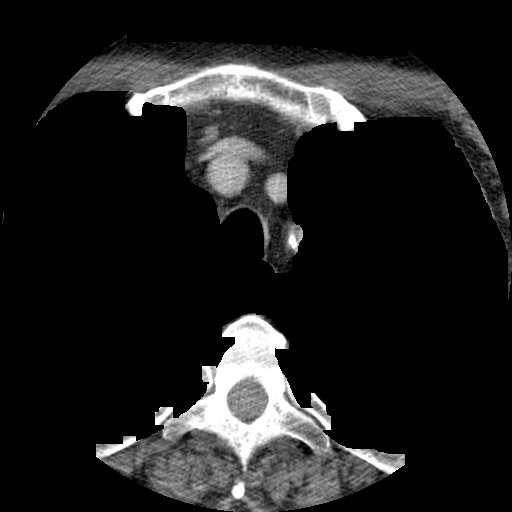
[im 7/66  bone]
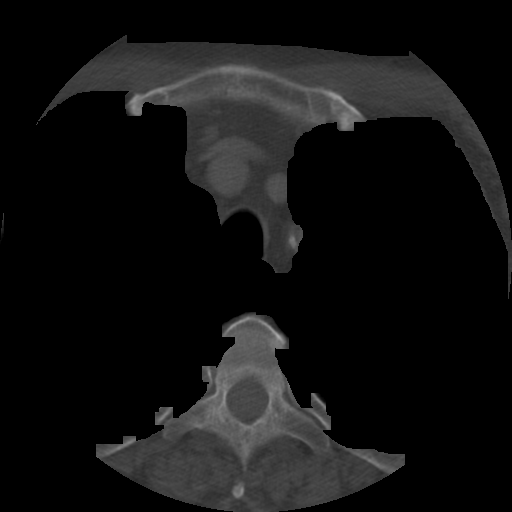
[im 14/66  bone]
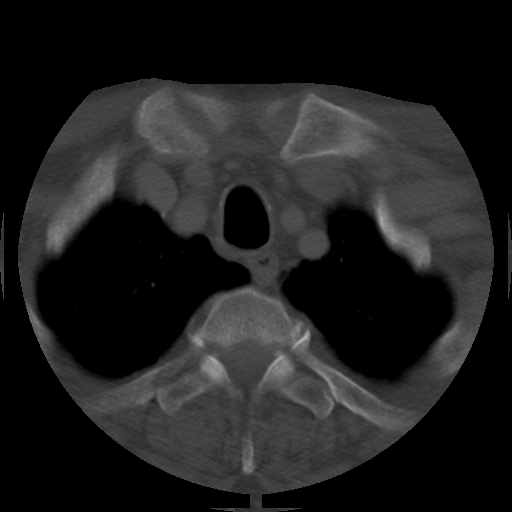
[im 20/66  bone]
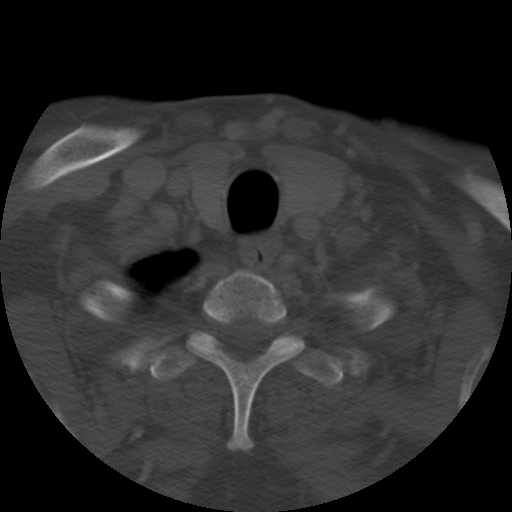
[im 27/66  bone]
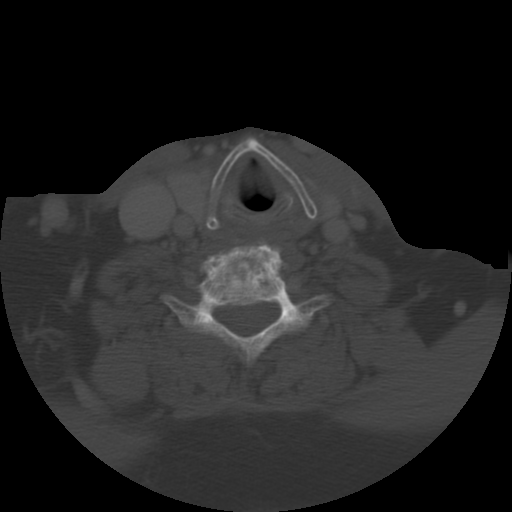
[im 40/66  soft-tissue]
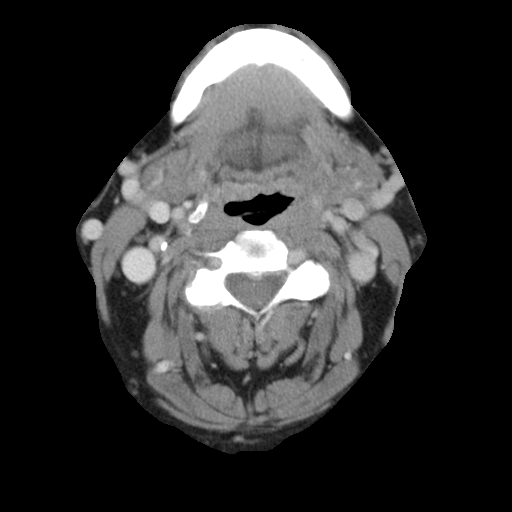
[im 40/66  bone]
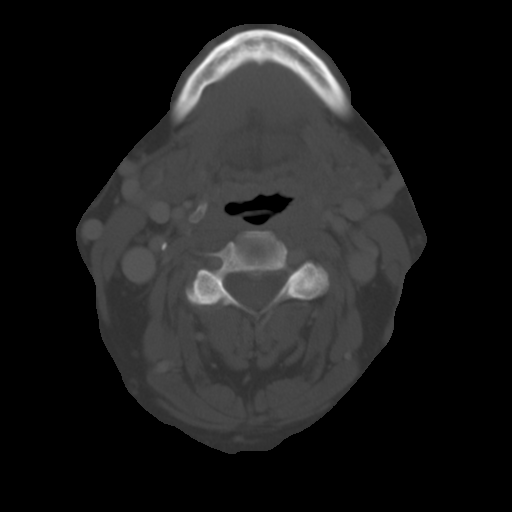
[im 46/66  bone]
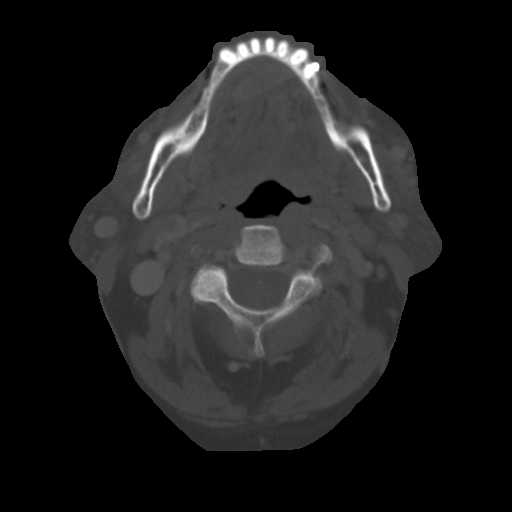
[im 53/66  bone]
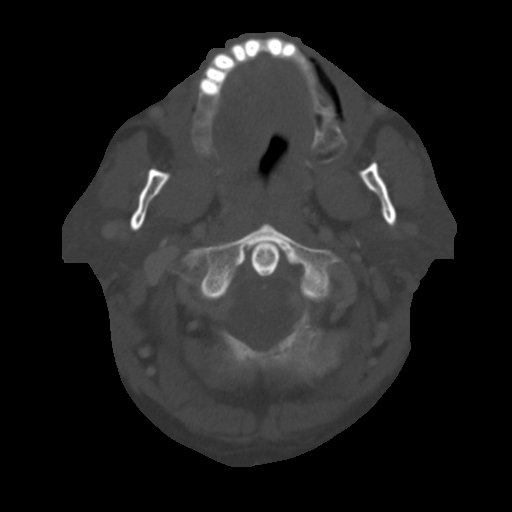
[im 59/66  bone]
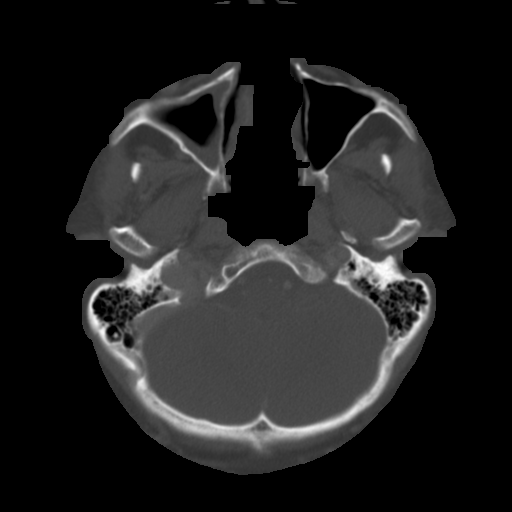

[Series 200: coronal · coronal · 0.49mm/px · 3 of 79 slices shown]
[im 16/79  bone]
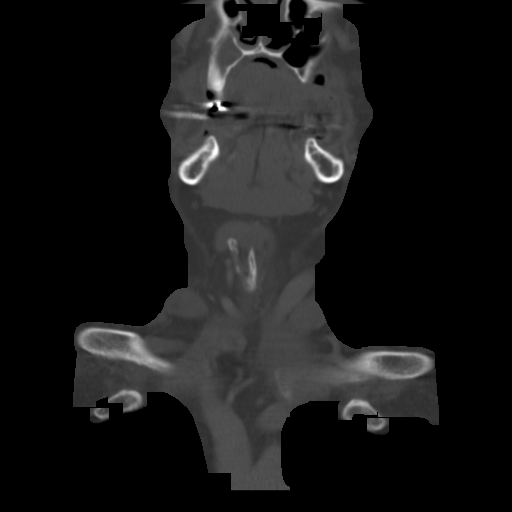
[im 32/79  bone]
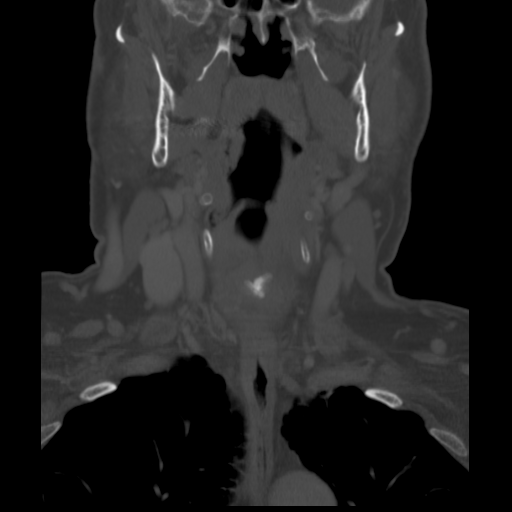
[im 47/79  bone]
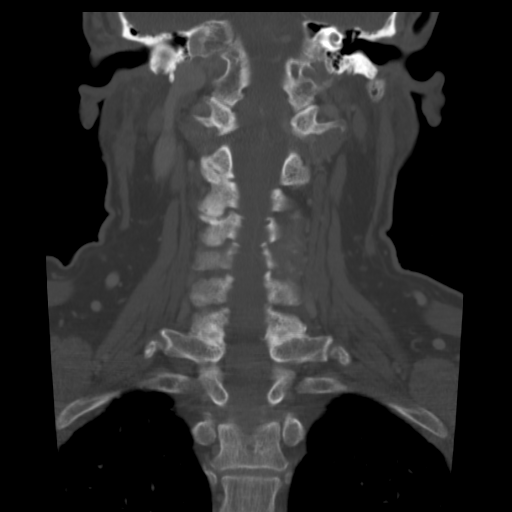

[Series 201: sagittal · sagittal · 0.49mm/px · 5 of 86 slices shown, 6 images]
[im 29/86  bone]
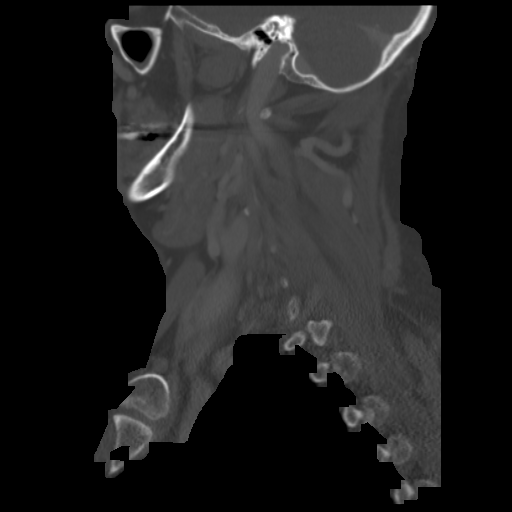
[im 36/86  bone]
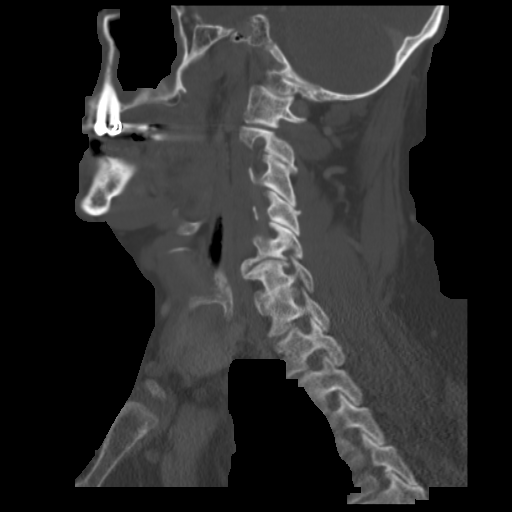
[im 43/86  soft-tissue]
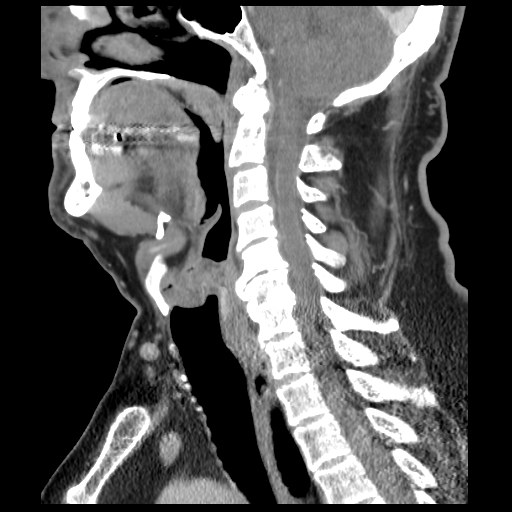
[im 43/86  bone]
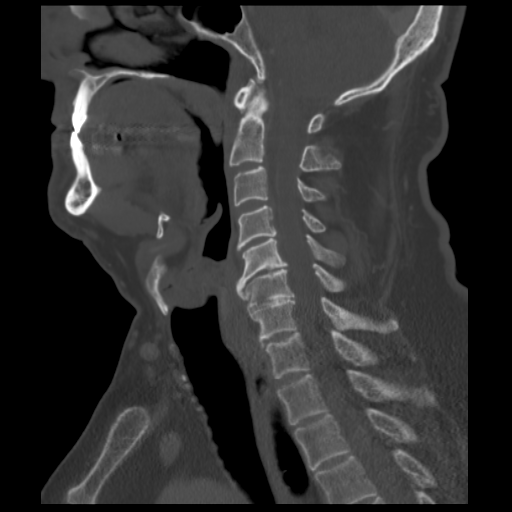
[im 50/86  bone]
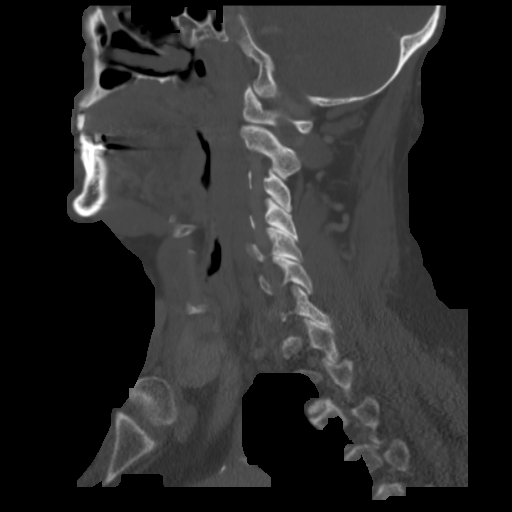
[im 57/86  bone]
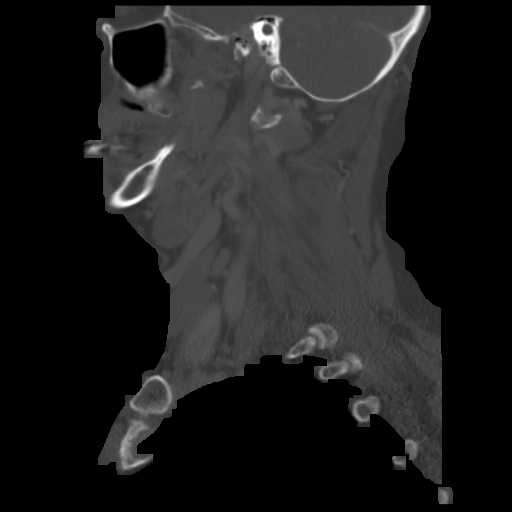

[16 of 33 positions shown; findings below may reference images not displayed]

FINDINGS: Lung apices show advanced centrilobular emphysema without
focal lesion.  Visualized intracranial structures appear
unremarkable.

Parotid glands are normal bilaterally.  Submandibular glands are
normal bilaterally.  The thyroid gland shows multiple small areas
of diminished attenuation as were evaluated with ultrasound.  These
are consistent with small nodules, likely multinodular goiter.
None measure more than a centimeter by CT and none are specifically
worrisome.

There is an abnormality immediately behind the hyoid bone in the
midline that extends through the space between the hyoid bone in
the thyroid cartilage and enters the substance of the strap muscles
on the left to the level of the mid thyroid cartilage.  This is
associated with a bit of calcific density within it.  I think this
is virtually certain to represent a thyroglossal duct cyst.  It is
collapsed and not anywhere near as large as it was on the
ultrasound of 3 weeks ago.  This is also a  typical feature of
thyroglossal duct cysts, occasionally getting larger and smaller
differential diagnosis for this appearance.

Elsewhere, there are no enlarged lymph nodes or masses in the neck.
There is degenerative spondylosis of the cervical spine but no
worrisome osseous lesion.
IMPRESSION: Abnormality behind the hyoid bone in the midline extending through
the space between the hyoid bone and thyroid cartilage into the
strap musculature.  This is highly likely to be a thyroglossal duct
cyst.  It is much smaller than was seen on the ultrasound of 3
weeks ago.  See above discussion.

## 2008-01-31 ENCOUNTER — Ambulatory Visit: Admission: RE | Admit: 2008-01-31 | Discharge: 2008-01-31 | Payer: Self-pay | Admitting: Internal Medicine

## 2008-02-09 ENCOUNTER — Encounter (INDEPENDENT_AMBULATORY_CARE_PROVIDER_SITE_OTHER): Payer: Self-pay | Admitting: Otolaryngology

## 2008-02-09 ENCOUNTER — Ambulatory Visit (HOSPITAL_COMMUNITY): Admission: RE | Admit: 2008-02-09 | Discharge: 2008-02-10 | Payer: Self-pay | Admitting: Otolaryngology

## 2008-03-08 ENCOUNTER — Encounter (HOSPITAL_COMMUNITY): Admission: RE | Admit: 2008-03-08 | Discharge: 2008-03-08 | Payer: Self-pay | Admitting: Internal Medicine

## 2008-06-09 ENCOUNTER — Encounter (INDEPENDENT_AMBULATORY_CARE_PROVIDER_SITE_OTHER): Payer: Self-pay | Admitting: *Deleted

## 2008-12-01 ENCOUNTER — Ambulatory Visit: Payer: Self-pay | Admitting: Gastroenterology

## 2008-12-15 ENCOUNTER — Ambulatory Visit: Payer: Self-pay | Admitting: Gastroenterology

## 2008-12-18 ENCOUNTER — Encounter: Payer: Self-pay | Admitting: Gastroenterology

## 2009-02-15 ENCOUNTER — Encounter: Admission: RE | Admit: 2009-02-15 | Discharge: 2009-02-15 | Payer: Self-pay | Admitting: Internal Medicine

## 2009-02-15 IMAGING — US US SOFT TISSUE HEAD/NECK
1 series · 14 of 25 positions shown · non-contrast
Comparison: Ultrasound [DATE]

CLINICAL DATA: Follow-up thyroid nodules

THYROID ULTRASOUND
TECHNIQUE: Ultrasound examination of the thyroid gland and
adjacent soft tissues was performed.

[Series 1: us soft tissue head/neck · 0.13mm/px · 14 of 46 slices shown]
[im 1/46]
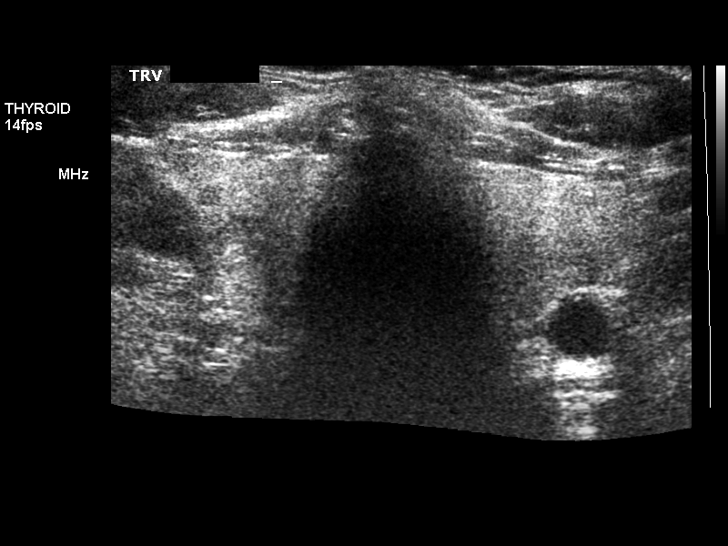
[im 4/46]
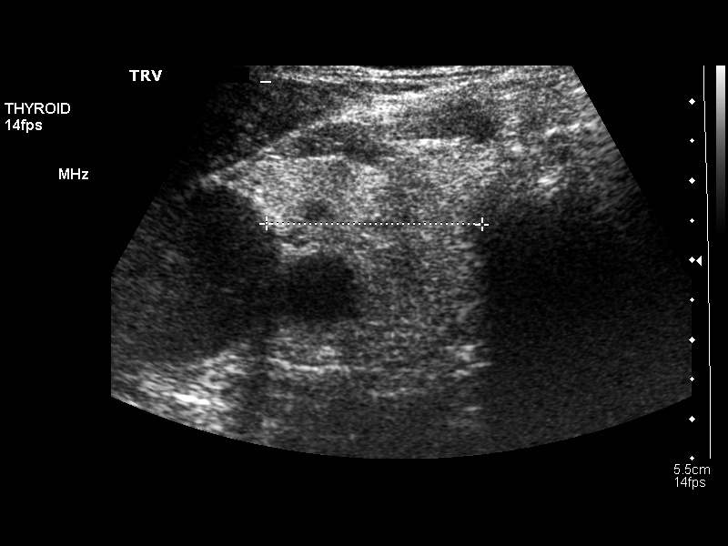
[im 8/46]
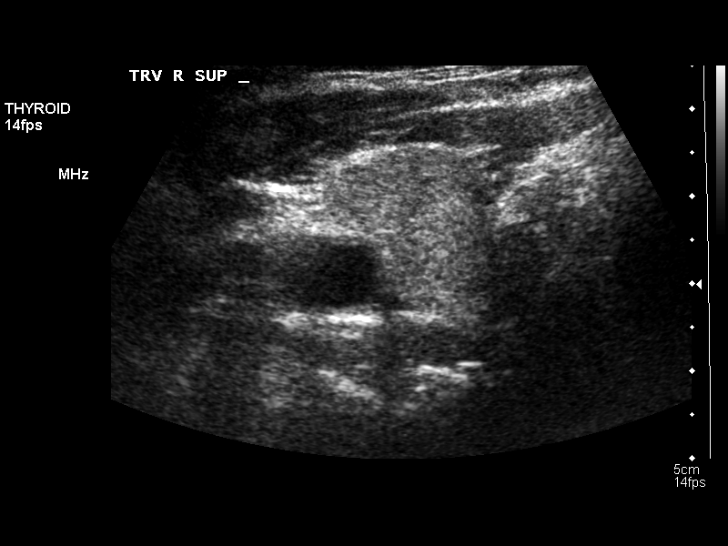
[im 12/46]
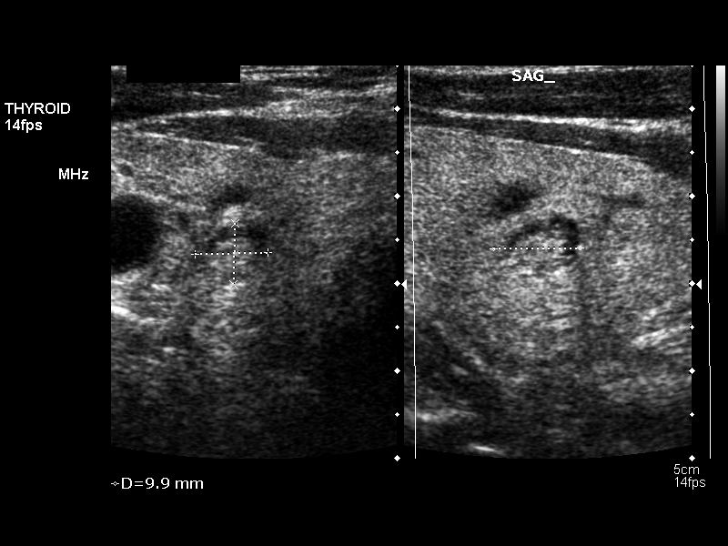
[im 16/46]
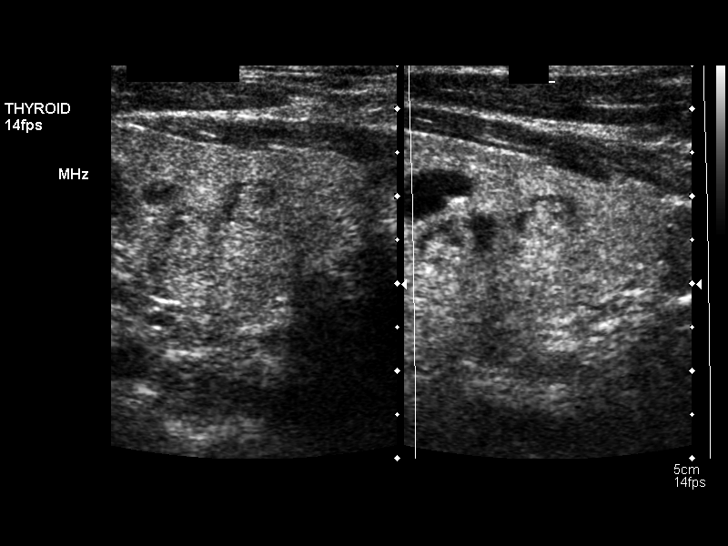
[im 17/46]
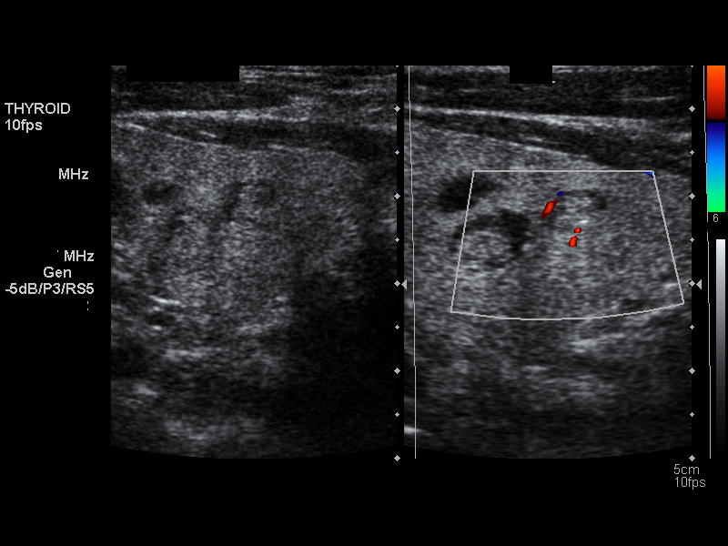
[im 21/46]
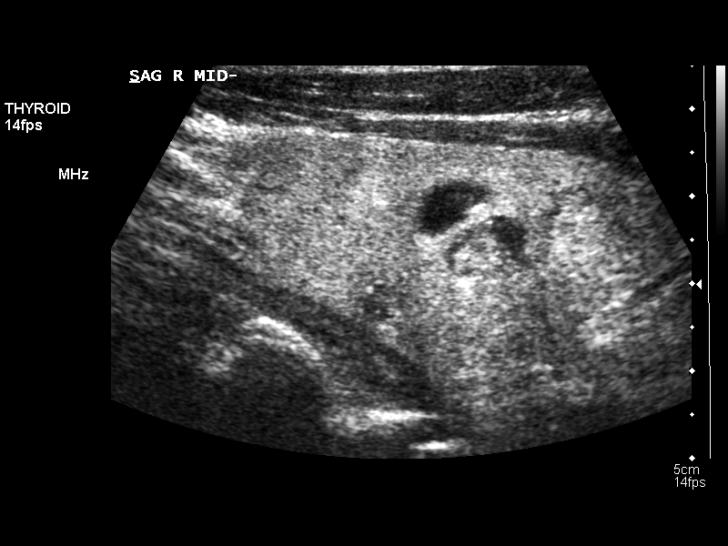
[im 25/46]
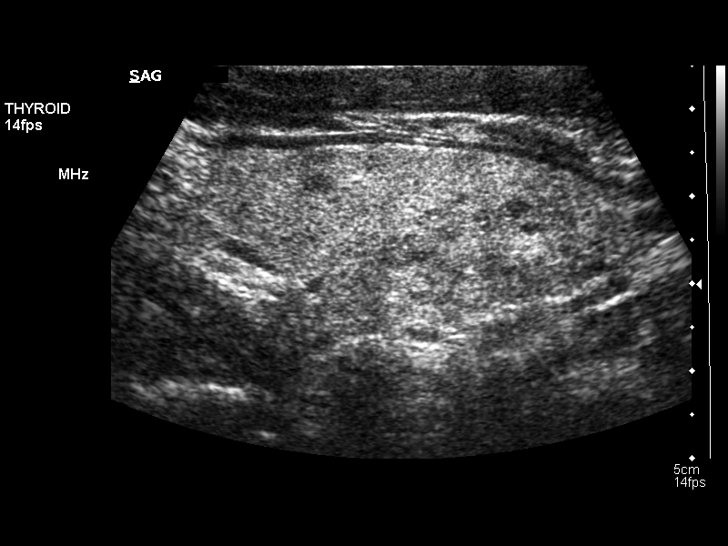
[im 29/46]
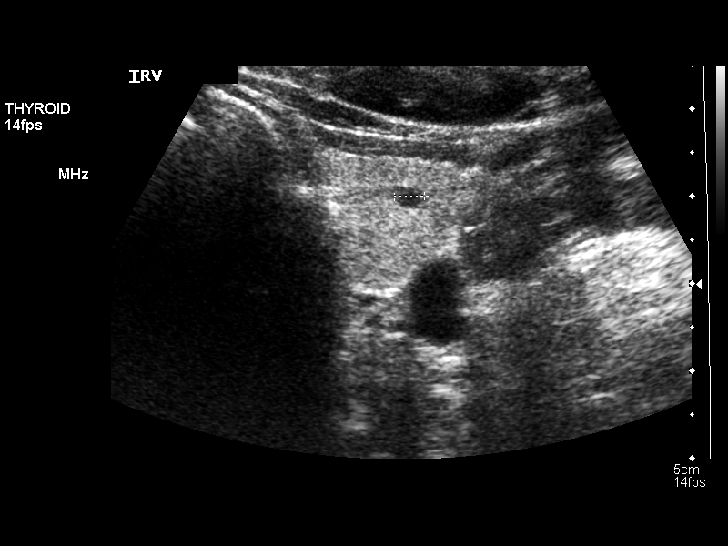
[im 31/46]
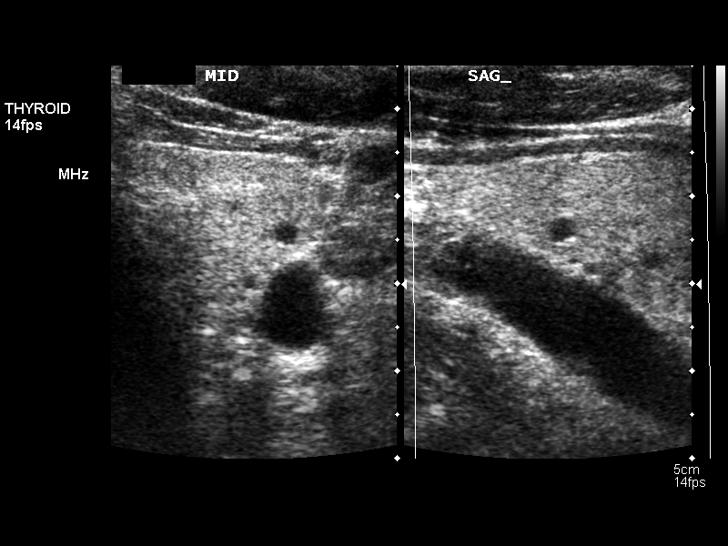
[im 34/46]
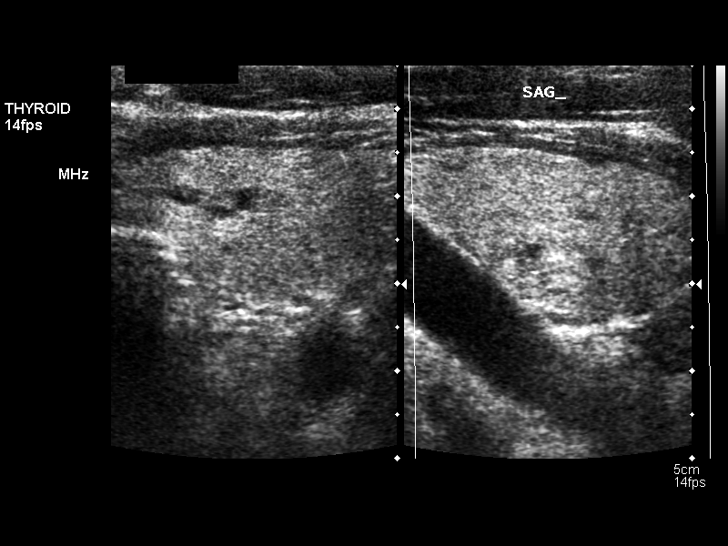
[im 38/46]
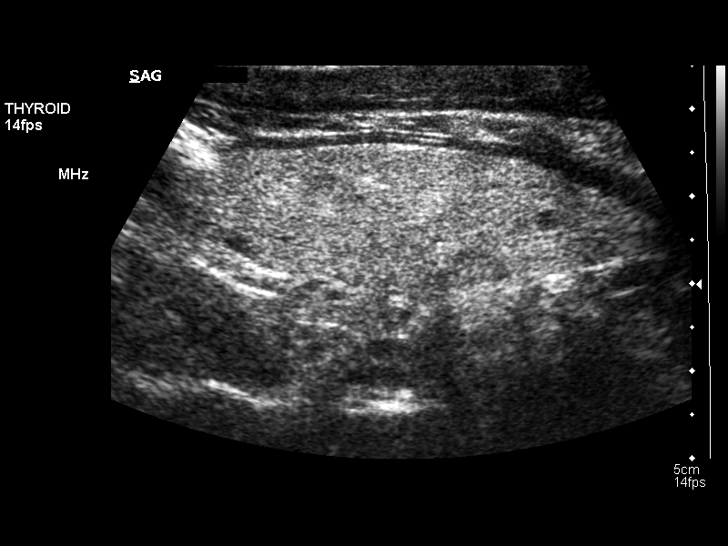
[im 42/46]
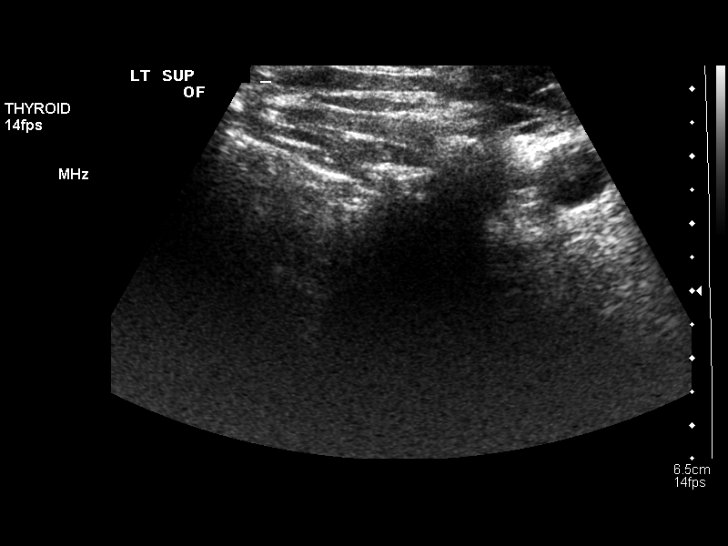
[im 46/46]
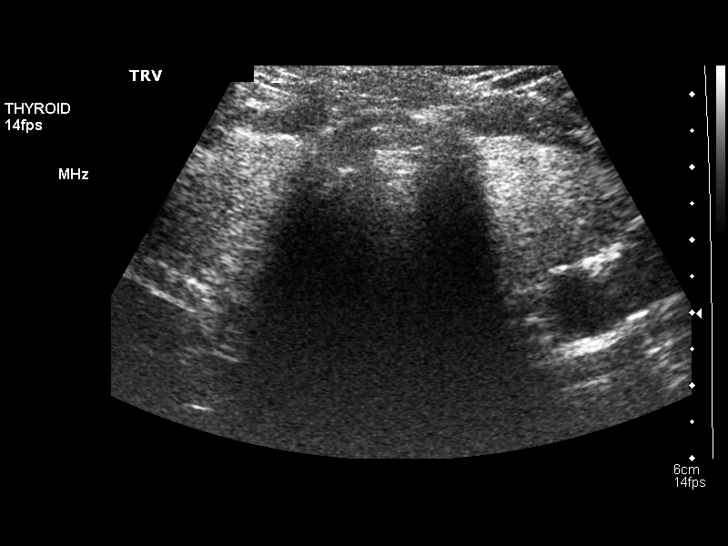

[14 of 25 positions shown; findings below may reference images not displayed]

FINDINGS: The right lobe measures 5.7 cm in length and 3.1 x 2.7 cm
transversely.  The left lobe measures 5.1 cm in length and 2.2 x
2.6 cm transversely.  The thyroid isthmus measures 0.5 cm in
thickness.  There is diffuse inhomogeneity of thyroid echotexture.
There are three nodules in the mid aspect right thyroid lobe, two
solid, and one hypoechoic.  There is a 1.1 x 0.8 x 1.1 cm
hypoechoic nodule.  There is a 0.8 x 0.7 x 1.0 cm solid nodule.
There is a 0.7 x 0.5 x 0.8 cm solid nodule as well.  In the mid to
inferior aspect of the left lobe, there is a 1.0 x 0.7 x 1.1 cm
solid nodule.
IMPRESSION: Multinodular goiter is again appreciated.  Multiple small nodules
bilaterally.  There are no nodules at this time which meet the
criteria for requiring biopsy.

## 2010-02-12 ENCOUNTER — Encounter
Admission: RE | Admit: 2010-02-12 | Discharge: 2010-02-12 | Payer: Self-pay | Source: Home / Self Care | Attending: Internal Medicine | Admitting: Internal Medicine

## 2010-02-12 IMAGING — US US SOFT TISSUE HEAD/NECK
1 series · 14 of 25 positions shown · non-contrast
Comparison: [DATE]

CLINICAL DATA: Multinodular goiter.

THYROID ULTRASOUND
TECHNIQUE: Ultrasound examination of the thyroid gland and adjacent
soft tissues was performed.

[Series 1: us soft tissue head/neck · 0.09mm/px · 14 of 65 slices shown]
[im 1/65]
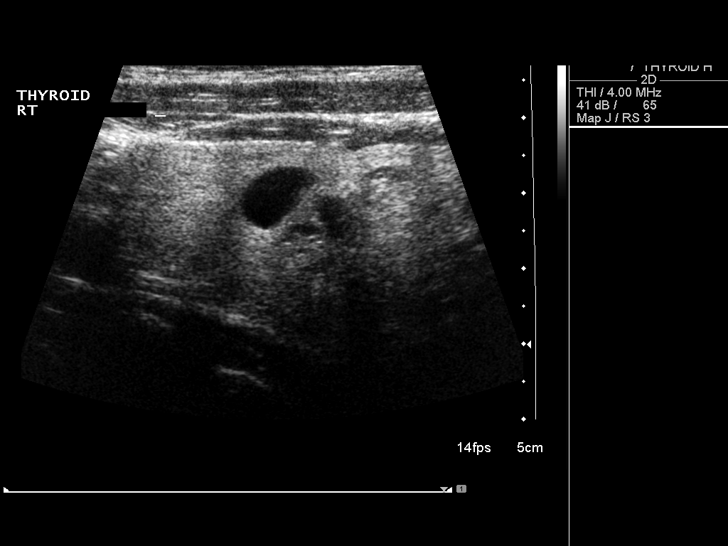
[im 6/65]
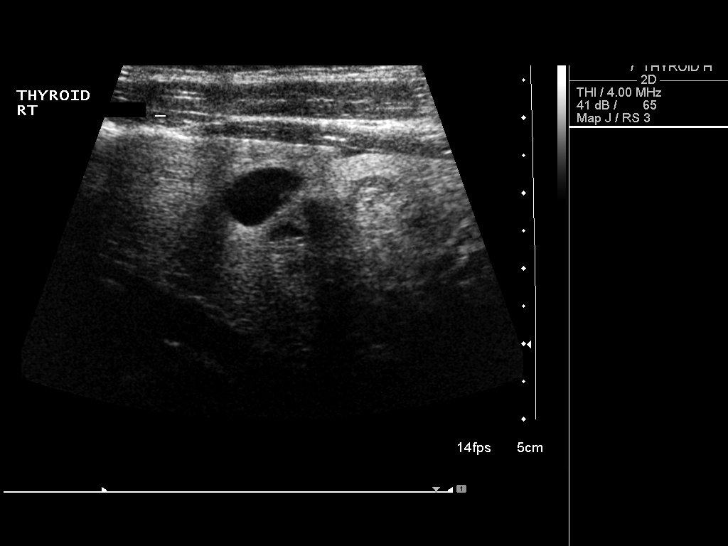
[im 11/65]
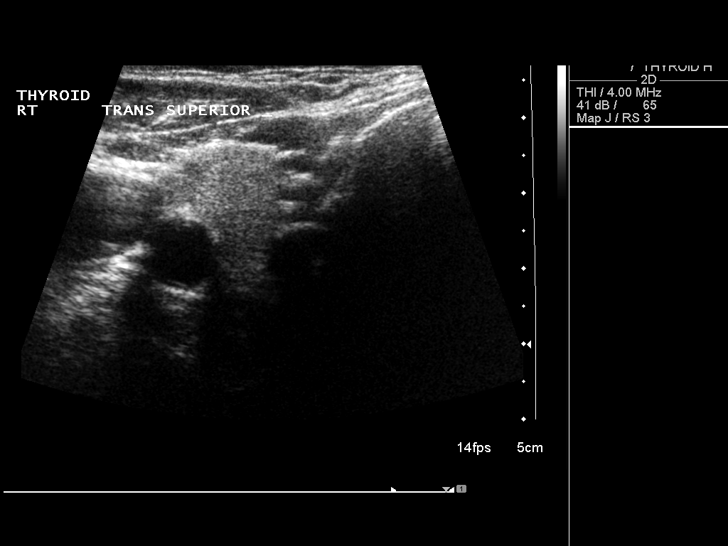
[im 17/65]
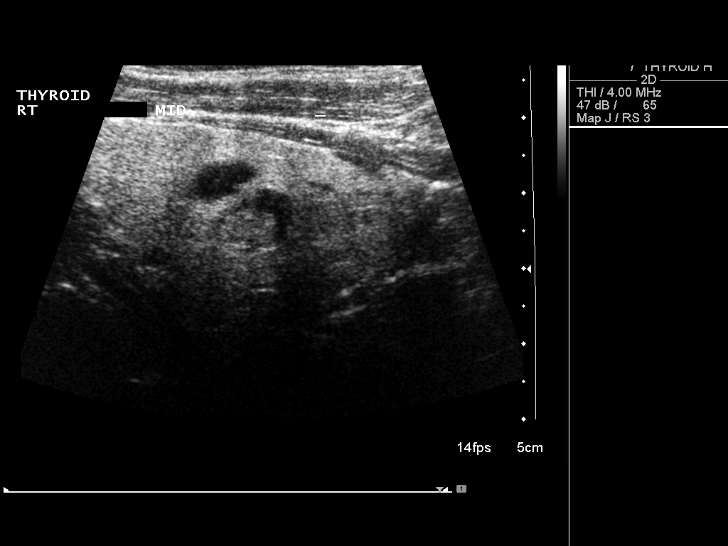
[im 22/65]
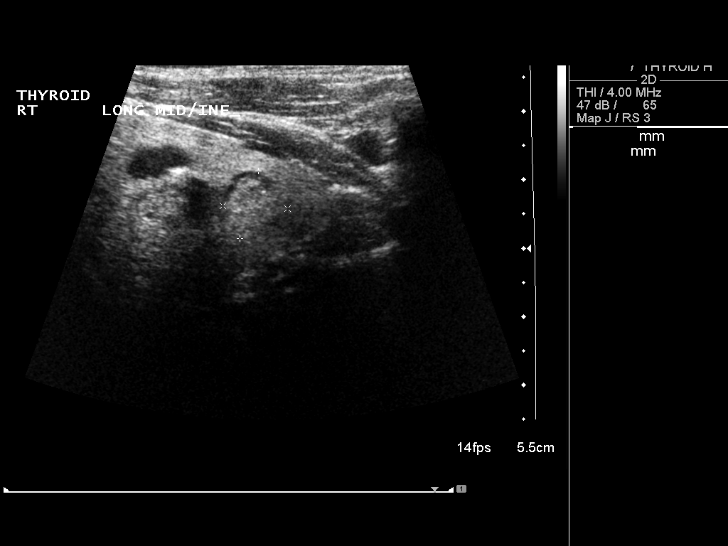
[im 25/65]
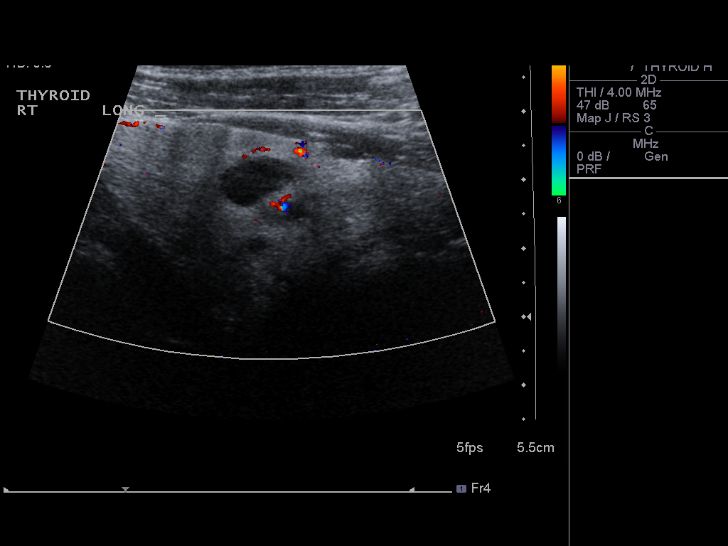
[im 30/65]
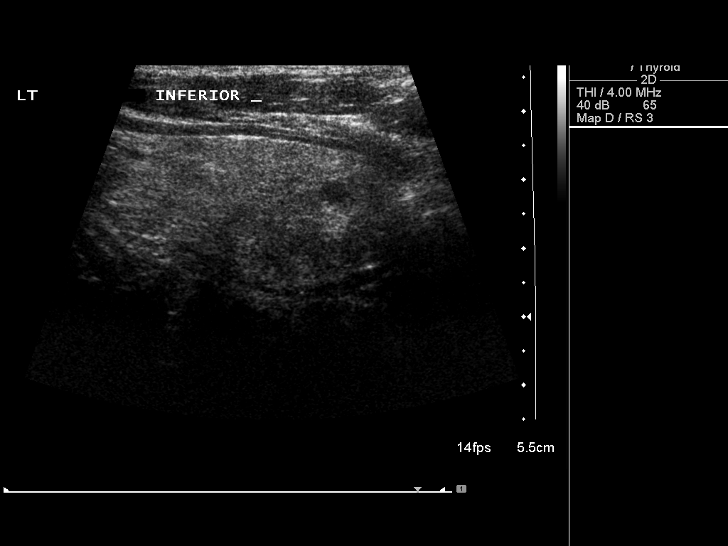
[im 35/65]
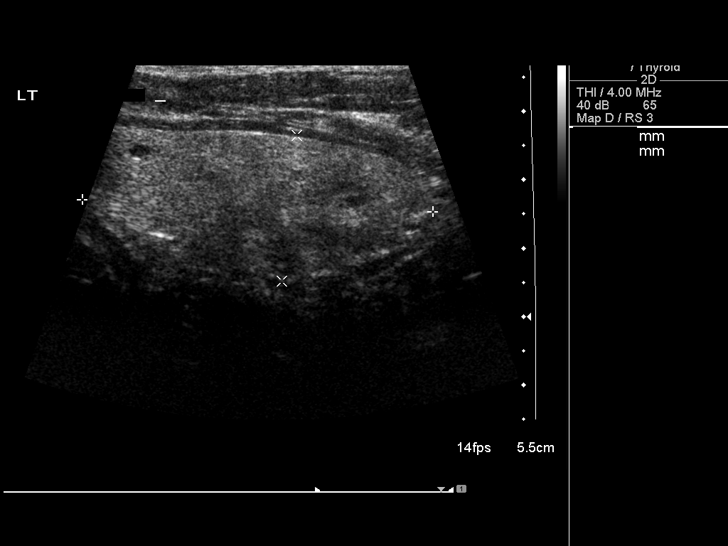
[im 41/65]
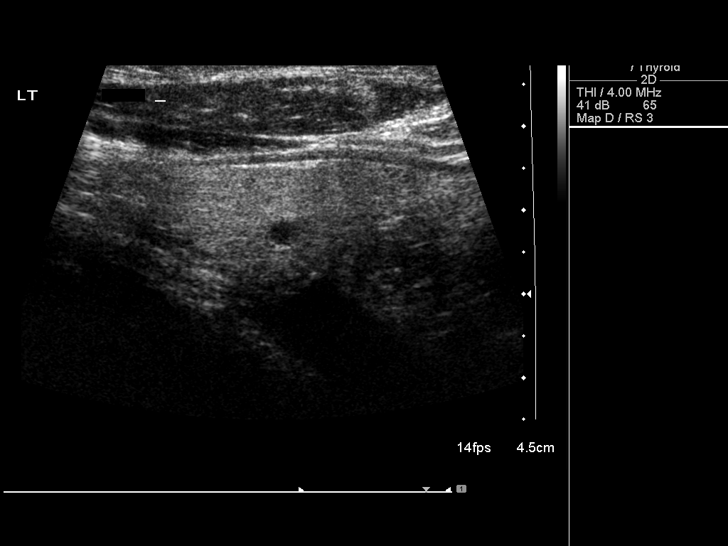
[im 43/65]
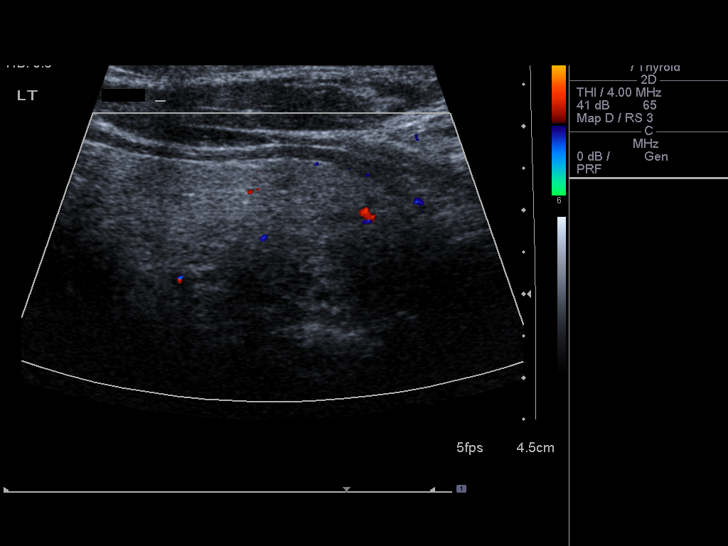
[im 49/65]
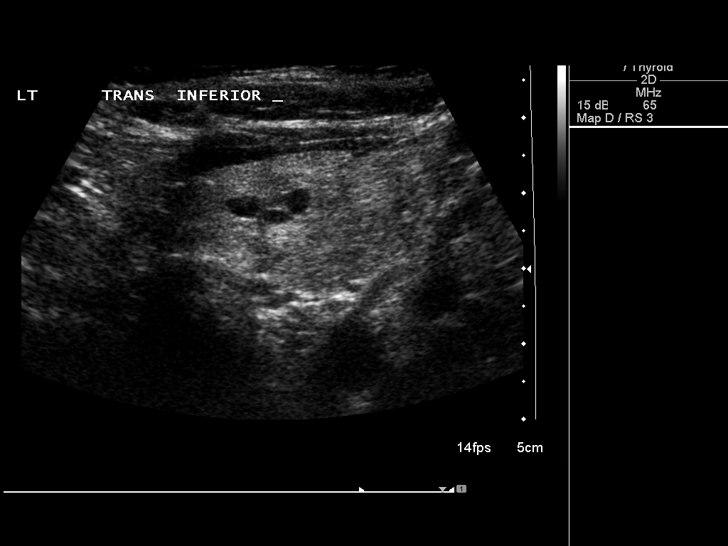
[im 54/65]
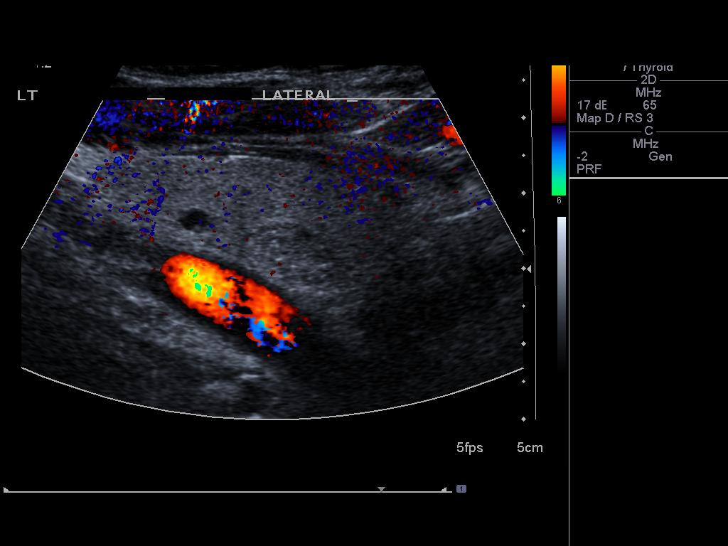
[im 59/65]
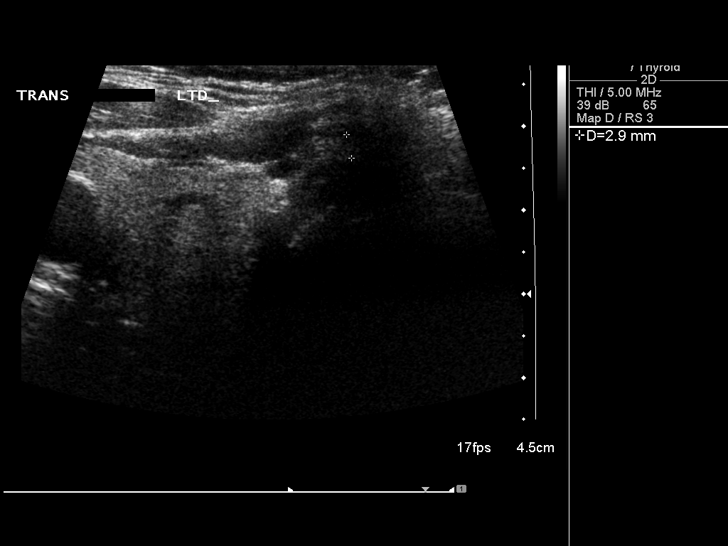
[im 65/65]
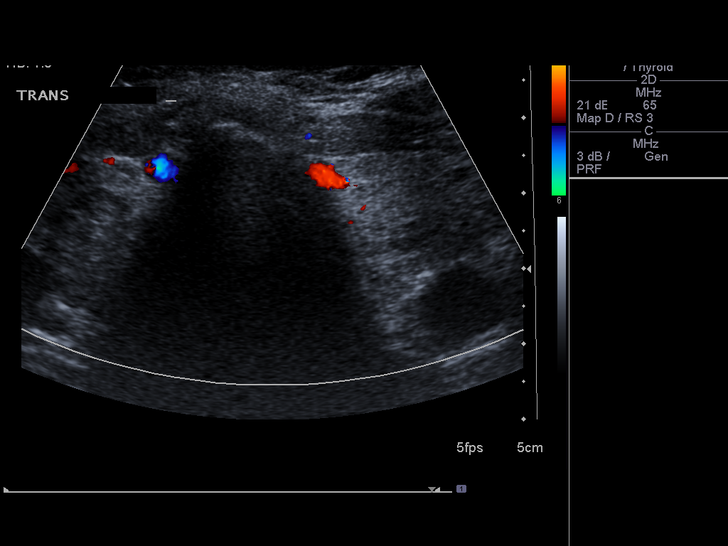

[14 of 25 positions shown; findings below may reference images not displayed]

FINDINGS: Right thyroid lobe:  5.6 x 2.7 x 2.6 cm
Left thyroid lobe:  5.1 x 2.1 x 2.7 cm
Isthmus:  0.3 cm

Focal nodules:  There are multiple tiny nodules in both lobes
without significant change since the prior study.Some of the
nodules or cystic, some are solid, and some are both solid and
cystic.

Lymphadenopathy:  None visualized.
IMPRESSION: Multinodular goiter without significant change since the prior
exam.

## 2010-03-17 ENCOUNTER — Encounter: Payer: Self-pay | Admitting: Family Medicine

## 2010-07-09 NOTE — Op Note (Signed)
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   PROCEDURE:  Left partial colectomy, laparoscopic assisted.   SURGEON:  Alfonse Ras, M.D.   ASSISTANT:  Ardeth Sportsman, M.D.   ANESTHESIA:  General endotracheal tube.   DESCRIPTION OF PROCEDURE:  The patient was taken to the operating room,  placed in a supine position.  After adequate general anesthesia was  induced using endotracheal tube, Foley catheter was placed and the  abdomen was prepped and draped in a normal sterile fashion.  Using a  transverse infraumbilical incision, I dissected down the fascia.  This  was opened vertically.  An 0 Vicryl pursestring suture was placed around  the fascial defect and Hasson trocar was placed in the abdomen.  Additional 5-mm trocar was placed in the left lower quadrant and in the  right midabdomen.  The left colon was then visualized and an area of  tattooing was identified.  The left colon was mobilized along the line  of Toldt up to the splenic flexure.  The area of tattooing was right at  the mid descending colon.  After adequate mobilization was carried out,  a transverse incision was made over the area of tattooing.  The area of  tattooing was then delivered up and through the wound.  It was  transected using a GIA stapling device and the mesentery was taken down  with a LigaSure.  On inspecting the specimen, however, even in the area  of tattooing, I did not see the adenoma.  On performing the side-to-side  total colocolostomy in the  standard fashion using a GI stapling device  and opening both ends, there was a small adenoma that was visualized  right at the distal end, which was transected using a TA stapling device  and sent for further pathologic evaluation.  The remainder of the lumen  was inspected both proximally and distally, and no other evidence of  lesions were identified.  The defect was closed with a TA-60 stapling  device.  This was reinforced with Tisseel.  There was no tension on the  anastomosis.  The abdomen was then copiously irrigated with saline.  Of  note, the patient was in the Innocoll study and was not randomized to  the sponge.  The fascia was closed in 2 layers using running #1 Novofil.  Trocars were removed.  The  infraumbilical fascial defect was closed with the 0 Vicryl pursestring  suture.  The skin incisions were closed with staples and injected with  0.5 Marcaine.  Sterile dressings were applied.  The patient tolerated  the procedure well and  went to PACU in good condition.      Alfonse Ras, MD  Electronically Signed     KRE/MEDQ  D:  11/27/2006  T:  11/27/2006  Job:  [id number]   cc:   Venita Lick. Russella Dar, MD, FACG  [address]  [place]  [place] [address]

## 2010-07-09 NOTE — Op Note (Signed)
NAME:  James [name], James [name] NO.:  [id number]   MEDICAL RECORD NO.:  [id number]          PATIENT TYPE:  OIB   LOCATION:  5151                         FACILITY:  [facility]   PHYSICIAN:  Kinnie Scales. Annalee Genta, M.D.DATE OF BIRTH:  1945/05/09   DATE OF PROCEDURE:  DATE OF DISCHARGE:                               OPERATIVE REPORT   LOCATION:  [facility] Main OR.   PREOPERATIVE DIAGNOSIS:  Anterior neck mass.   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   SURGICAL PROCEDURES:  Excision of thyroglossal duct cyst.   ANESTHESIA:  General endotracheal.   SURGEON:  Kinnie Scales. Annalee Genta, MD   ASSISTANT:  Gloris Manchester. Lazarus Salines, M.D.   ANESTHESIA:  General endotracheal.   COMPLICATIONS:  None.   BLOOD LOSS:  Less than 50 mL.  The patient transferred from the  operating room to the recovery room in stable condition.   BRIEF HISTORY:  The patient is a 65 year old white male who is referred  by his medical physician, Dr. Merri Brunette for evaluation of laryngeal  deviation, anterior soft tissue mass, and possible thyroglossal duct  cyst.  The patient was seen in Dr. Carolee Rota office for routine  examination when his physician noted deviation of the patient's  laryngeal cartilage to the right.  Ultrasound was performed.  The  patient was found to have soft tissue mass involving the left  perilaryngeal region and extending to the thyrohyoid membrane.  The  patient was then referred to our office for additional workup and  treatment.  A CT scan of the neck was performed and this showed a soft  tissue density mass involving the thyrohyoid region extending deep to  the mid aspect of the hyoid bone and over the lateral aspect of the left  thyroid ala.  The patient had significant laryngeal/thyroid cartilage  deviation to the right.  There was no adenopathy or other mass noted and  the patient had bilateral thyroid lobes in normal anatomic  position with  complex nodular changes bilaterally.  Given that the patient was  completely asymptomatic regarding the mass.  No symptoms of infection or  other abnormality.  Given his history and examination, I recommended to  undertake the excision of the mass under general anesthesia.  The risks,  benefits, possible complications of procedure were discussed in detail  and the patient understood and concurred our plan for surgery which was  scheduled as an outpatient under general anesthesia on February 09, 2008.   PROCEDURE:  The patient was brought to the operating room at [facility] main OR, placed in supine position on the operating table.  General endotracheal anesthesia was established without difficulty.  When the patient was adequately anesthetized, his neck was injected with  1% lidocaine 1:100,000 solution of epinephrine, total of 4 mL injected  in the anterior neck skin in the proposed skin incision.  The patient  was then positioned on the operating table and prepped and draped in a  sterile fashion.  Procedure was begun by creating an approximately 4  cm  horizontally oriented incision in the mid aspect of the neck overlying  the palpable neck mass and thyroid cartilage.  This carried through the  skin underlying subcutaneous tissue.  Platysma muscle was identified  laterally and divided and subplatysmal flaps were elevated inferiorly  and superiorly in order to allow access to the anterior compartment of  the neck.  The strap muscles were divided in the midline.  The  underlying laryngeal structures were identified and preserved.  The  strap muscles were elevated and anterior to the thyrohyoid membrane,  there was a cystic appearing complex mass consistent with findings on  previous study.  The mass was then carefully dissected free of the  underlying thyroid/laryngeal cartilage.  Strap muscles were elevated and  the mass was carefully dissected.  A tract medial  to the midportion of  the hyoid bone and findings were consistent with the thyroglossal duct  cyst.  The strap muscle attachments to the hyoid bone were then elevated  and the hyoid was divided into thirds resecting the anterior component  of the hyoid bone.  Dissection was then carried deep to the hyoid bone  to the level of the tongue base.  The entire cyst was removed.  There  was no evidence of tract or fistula noted in this area after careful  dissection.  Hemostasis was maintained with bipolar cautery.  The  central portion of the hyoid gland including the entire cystic structure  was removed and sent to pathology for gross and microscopic evaluation.  The patient's wound was then thoroughly irrigated with sterile saline  solution.  A 15-French round drain was placed in the depth of the  incision through a separate stab incision and sutured in position with a  3-0 Ethilon suture.  The wound was then closed in multiple layers with  reapproximation of the strap muscles in the midline with 3-0 Vicryl.  The platysma muscles were reapproximated bilaterally with 3-0 Vicryl,  and deep subcutaneous tissue was closed with 4-0 Vicryl.  Final skin  edge was closed with 5-0 Ethilon suture in a running locked fashion.  The patient was awakened from the anesthetic, extubated, and transferred  from the operating room to recovery room in stable condition.   COMPLICATIONS:  No complications.   BLOOD LOSS:  Approximately 50 mL.           ______________________________  Kinnie Scales. Annalee Genta, M.D.     DLS/MEDQ  D:  81/19/1478  T:  02/10/2008  Job:  [id number]   cc:   Soyla Murphy. Renne Crigler, M.D.

## 2010-07-12 NOTE — Discharge Summary (Signed)
NAME:  James [name], James [name] NO.:  [id number]   MEDICAL RECORD NO.:  [id number]          PATIENT TYPE:  OBV   LOCATION:  2007                         FACILITY:  [facility]   PHYSICIAN:  Madolyn Frieze. Jens Som, MD, FACCDATE OF BIRTH:  Jan 31, 1946   DATE OF ADMISSION:  02/09/2006  DATE OF DISCHARGE:  02/10/2006                               DISCHARGE SUMMARY   PRIMARY CARE PHYSICIAN:  Quita Skye. Artis Flock, M.D.   PRIMARY GASTROENTEROLOGIST:  Ulyess Mort, MD.   PRIMARY CARDIOLOGIST:  Previously Dr. _______, but now Dr. Olga Millers.   PROCEDURES PERFORMED:  None.   PRIMARY DIAGNOSIS:  Noncardiac chest pain, found to be pleuritic in  nature.   SECONDARY DIAGNOSES:  1. Chest pain, nonobstructive coronary artery disease.      a.     Cardiac catheterization February 12, 2002 revealed normal       left main; left anterior descending 20% ostial, 30-40% mid; left       circumflex normal; right coronary artery normal; ejection fraction       of 50%.  2. Ongoing tobacco abuse with 86 pack-year history, smoking 2 packs      per day currently.  3. Glaucoma.  4. History of tubulovillous adenoma of the sigmoid colon; status post      polypectomy, not seen on follow-up colonoscopy of September 2003.  5. Status post cholecystectomy.  6. Status post right shoulder surgery.  7. Status post knee arthroscopy.   ALLERGIES:  Allergies to MORPHINE and CODEINE causing itching.   HISTORY OF PRESENT ILLNESS AND HOSPITAL COURSE:  This is a 65 year old  Caucasian male with a history of nonobstructive coronary artery disease  and continued smoking who presented to the emergency room with shortness  of breath and pain with deep breathing and lying flat.  The patient took  Motrin at home and felt some relief of discomfort.  The pain was gauged  at a 10/10, but now decreased to a 6/10 after about 30 minutes.  The  patient, secondary to ongoing discomfort with no change in pain  intensity with  continued waiting after Motrin, presented to the  emergency room.   On arrival in the emergency room, the patient was seen and examined by  Geoffry Paradise, nurse practitioner, and Dr. Olga Millers.  On arrival,  the patient's blood pressure was 152/125, pulse 74, respirations 16,  with a temperature of 97.6.  The patient was evaluated for pericarditis.  A D-dimer was checked, along with serial cardiac enzymes.  The patient's  EKG was normal, revealing no evidence of acute ischemia.  There was a  first-degree AV block noted.   The patient was kept overnight.  Serial enzymes were completed.  The  patient was treated with NSAIDs, GI cocktail and breathing treatments  using Xopenex.  The patient had an echocardiogram completed on the day  of discharge which was found to be normal.  The patient was feeling  somewhat better.  He still had some pain with inspiration despite the  use of breathing treatments.  The patient was continued  on nonsteroidals  and was anxious to go home.   LABORATORY INVESTIGATIONS:  On discharge, hemoglobin 17.5, hematocrit  50.1, white blood cells 9.7, platelets 204,000.  PT 13.7, INR 1.0, PTT  30.  Sodium 145, potassium 4.1, chloride 107, bicarbonate 30, glucose  87, BUN 8, creatinine 0.8.  Troponins were negative x three at 0.01,  0.02, and 0.02, respectively.  The patient's D-dimer was negative at  less than 0.22.  TSH 0.577, cholesterol 182, triglycerides 219, HDL 31,  LDL 107.   Vital signs on discharge included a blood pressure of 109/67, pulse 67,  respirations 20, temperature 96.4.  The patient weighed 220.9 pounds.   FOLLOW-UP PLAN AND APPOINTMENT:  1. The patient has been scheduled for an outpatient stress Myoview      study on December 28 at 7:45 a.m.  2. The patient is scheduled for a follow-up appointment with Dr.      Jens Som or physician assistant / N.P. on January 2nd at 10:45.  3. The patient is to call Dr. Artis Flock, his primary care physician,  to      make a follow-up appointment.  4. The patient has been advised on smoking cessation and has been      counseled by [facility] concerning this.   MEDICATIONS ON DISCHARGE:  1. Chantix 0.5 mg once per day x three days, one twice per day x four      days following, then 1 mg twice per day for 11 weeks.  2. Zoloft 100 mg once per day.  3. Alphagan eyedrops to both eyes twice per day.  4. Lumigan eyedrops to both eyes twice per day.  5. Zantac 150 mg daily.  6. Ibuprofen 800 mg 3 times per day as needed for pain.   DICTATION NOTE:  Duration of discharge to include physician's time,  approximately 35 minutes.      Bettey Mare. Lyman Bishop, NP      Madolyn Frieze. Jens Som, MD, [facility]  Electronically Signed    KML/MEDQ  D:  02/10/2006  T:  02/11/2006  Job:  [id number]   cc:   Quita Skye. Artis Flock, M.D.

## 2010-07-12 NOTE — Discharge Summary (Signed)
NAME:  BRADDOCK, SERVELLON NO.:  [id number]   MEDICAL RECORD NO.:  [id number]          PATIENT TYPE:  INP   LOCATION:  1524                         FACILITY:  [facility]   PHYSICIAN:  Alfonse Ras, MD   DATE OF BIRTH:  March 11, 1945   DATE OF ADMISSION:  11/27/2006  DATE OF DISCHARGE:  12/04/2006                               DISCHARGE SUMMARY   ADMISSION DIAGNOSIS:  Left colon villous adenoma.   DISCHARGE DIAGNOSIS:  Left colon villous adenoma.   CONDITION ON DISCHARGE:  Good and improved.   DISPOSITION:  Discharged to home.   Followup with me in 1 week.   Medications include Vicodin for pain.   HISTORY OF PRESENT ILLNESS:  The patient is a 65 year old white male  who underwent attempted colonoscopic resection of a left villous adenoma  without evidence of atypia or malignancy.  There is a quite large  villous adenoma.  He had been preoperatively tattooed and sent to me by  Dr. Russella Dar and Dr. Corinda Gubler for further evaluation and treatment.  After  long discussion with the patient about laparoscopic versus open  approaches and the complications associated with both, the patient opted  for a lap-assisted left colectomy.   HOSPITAL COURSE:  The patient was admitted after home bowel prep.  He  was taken to the operating room and underwent lap-assisted left  hemicolectomy.  Postoperatively, he did well and remained afebrile.  His  Foley was discontinued on postoperative day 1.  On postoperative day 2,  he was still feeling better but was complaining of some heartburn, had a  little bit of an ileus, which continued, and had some nausea but no  emesis on postop day 3.  By postop day 4, he was passing flatus and was  advanced to clear liquids.  By postoperative day 5, he had a temperature  at 101 and a slightly productive cough.  Chest x-ray was obtained and  showed some chronic bronchitis.  By postoperative day 6, he was feeling  much better.  His saturations, which had  dropped a night before, had  returned back up to 98 and he was discharged home on postoperative day  7.      Alfonse Ras, MD  Electronically Signed     KRE/MEDQ  D:  12/26/2006  T:  12/27/2006  Job:  [id number]

## 2010-07-12 NOTE — H&P (Signed)
NAME:  James [name], James [name] NO.:  [id number]   MEDICAL RECORD NO.:  [id number]                   PATIENT TYPE:  INP   LOCATION:  0103                                 FACILITY:  [facility]   PHYSICIAN:  Hedwig Morton. Juanda Chance, M.D. [facility]            DATE OF BIRTH:  02-11-1946   DATE OF ADMISSION:  11/16/2001  DATE OF DISCHARGE:                                HISTORY & PHYSICAL   CHIEF COMPLAINT:  Weakness, intermittent chest pain x1 month, shortness of  breath.   HISTORY:  The patient is a pleasant 65 year old white male primary patient  of Dr. Karma Ganja, also known to Dr. Victorino Dike who has history of  glaucoma, colon polyps, hemorrhagic gastritis on endoscopy in November 2002.  He is status post cholecystectomy, two right shoulder repairs, and had  arthroscopy on his right knee.  The patient had undergone colonoscopy in  November 2002 for evaluation of anemia and heme-positive stool.  Was found  at that time to have a large 3.6 cm sessile polyp in the sigmoid colon which  was partially removed piece meal.  He also had a smaller transverse colon  polyp removed.  Pathology on the large polyp was consistent with a  tubovillous adenoma.  Repeat colonoscopy was done in March 2003 showing a  2.4 cm polyp in the sigmoid which was completely removed.  Again, pathology  was consisted with tubovillous polyp, no malignancy.  Plan was for follow-up  colonoscopy in one year.  His hemoglobin in January 2003 was 14.7 with MCV  of 78.  Those are the last laboratories that we have available.   The patient called the office yesterday with complaints of weakness,  occasional lightheadedness, intermittent chest pain over the past three to  four weeks, and was wondering whether he was losing blood.  CBC was done  showing this morning a hemoglobin of 9, MCV of 60.  When the laboratory  returned today the patient was advised to come to the emergency room for  further evaluation.  He has  remained on Nexium 40 mg p.o. q.d.  Has also  been taking a baby aspirin one q.d.  No NSAIDs.  He has no complaints of  abdominal discomfort.  His bowel movements have been normal.  He is unaware  of any melena or hematochezia.  His appetite has been fine.  His weight has  been stable.  He has no regular complaints of heartburn or indigestion.  He  has complained of weakness and fatigue over the past few weeks and some  shortness of breath.   The patient has also been experiencing substernal chest pain intermittently,  sometimes occurring three to four times per day lasting anywhere from two to  eight minutes.  Not generally severe, but associated with dyspnea.  He does  not have any diaphoresis that he has been aware of.  He says the pain has  occurred with exertion and at rest.  He has noted some increased frequency  over the past week.  At times the pain radiates into his left shoulder and  left arm.  There is no chest pain today thus far.  He describes this  sensation as poking or stabbing and is unable to differentiate it from  indigestion.  He is admitted at this time for further evaluation, noted to  be hemodynamically stable in the ER, but in a junctional rhythm on arrival  with T-wave changes in the inferior leads.   LABORATORIES:  Hemoglobin 8.3, hematocrit 27.2, WBC 6.4, MCV 60, platelets  385,000.  Pro time 12.4, INR 0.9.  Electrolytes within normal limits.  LFTs  within normal limits.  Troponin 0.05.  CK 70, MB 1.6.   MEDICATIONS:  1. Zoloft 50 mg q.d.  2. Nexium 40 q.d.  3. Aspirin 81 q.d.  4. Eye drops one drop each eye q.d.   ALLERGIES:  CODEINE which causes itching.   PAST MEDICAL HISTORY:  As outlined above.   FAMILY HISTORY:  Mother deceased at 62 with complications of COPD.  Father  deceased at 34 with ETOH abuse.  Siblings alive and well.   SOCIAL HISTORY:  The patient is married.  He is employed in [employer] for a  [employer].  He is a smoker two  packs per day.  Uses alcohol socially  on the weekends.   REVIEW OF SYMPTOMS:  CARDIOVASCULAR/PULMONARY:  As above.  GENITOURINARY:  Negative.  GASTROINTESTINAL:  As above.  MUSCULOSKELETAL:  As above.   PHYSICAL EXAMINATION:  GENERAL:  Well-developed white male in no acute  distress.  He is alert and oriented x3.  VITAL SIGNS:  His O2 saturation is 98 on room air, blood pressure 133/80,  pulse 87, respirations 18.  HEENT:  Nontraumatic, normocephalic.  EOMI.  PERLA.  Sclerae anicteric.  NECK:  Supple without nodes.  No JVD or bruit.  CARDIOVASCULAR:  Regular rate and rhythm with S1 and S2.  No murmur, rub, or  gallop.  PULMONARY:  Clear to A&P.  ABDOMEN:  Soft.  He is mildly tender in the left lower quadrant.  There is  no palpable mass or hepatosplenomegaly.  Bowel sounds are active.  RECTAL:  Brown stool which is heme-positive 2-3+.  No mass.  EXTREMITIES:  Without edema.  Pulses are 2+ and equal.  NEUROLOGIC:  Nonfocal.   IMPRESSION:  44. A 65 year old white male with microcytic anemia, heme-positive stool in     the setting of a recurrent anemia.  Rule out recurrence of sigmoid     villous adenoma with oozing versus peptic ulcer disease or gastritis.  2. Intermittent chest pain.  Rule out anemia induced angina.  3. Junctional rhythm on presentation, now converted to sinus rhythm.  4. Smoker.  5. Glaucoma.  6. History of large tubovillous adenoma in the sigmoid colon.  7. Colonoscopy March 2003.  8. Status post cholecystectomy.  9. Status post shoulder repair x2 on the right and right knee arthroscopy.   PLAN:  The patient is admitted to telemetry.  He will be transfused to keep  his hemoglobin 9-10.  Place him on a b.i.d. PPI.  Will obtain cardiology  consultation and ask cardiology to proceed with cardiac work-up prior to  endoscopic evaluation.  It is anticipated that he will need endoscopy and repeat colonoscopy and if indeed the tubovillous adenoma has recurred, will   need surgical resection for definitive management.  Amy Esterwood, P.A.-C. [facility]                Dora M. Juanda Chance, M.D. [facility]    AE/MEDQ  D:  11/16/2001  T:  11/16/2001  Job:  [id number]   cc:   [place]ale [place]. Andrey Campanile, M.D.

## 2010-07-12 NOTE — Cardiovascular Report (Signed)
NAME:  James [name], BOK NO.:  [id number]   MEDICAL RECORD NO.:  [id number]                   PATIENT TYPE:  OIB   LOCATION:  2899                                 FACILITY:  [facility]   PHYSICIAN:  Learta Codding, M.D. [facility]             DATE OF BIRTH:  [birth year]   DATE OF PROCEDURE:  DATE OF DISCHARGE:  01/12/2002                              CARDIAC CATHETERIZATION   DIAGNOSES:  1. No evidence of significant obstructive cardiac coronary artery disease.  2. Normal left ventricular systolic function.  3. Mildly elevated left ventricular and diastolic pressure.   PROCEDURES:  1. Left heart catheterization with selective coronary angiography.  2. Left ventriculography.   INDICATIONS:  The patient is a 65 year old male with risk factors for  coronary artery disease.  The patient has been referred for a diagnostic  catheterization to assess the coronary anatomy.   DESCRIPTION OF PROCEDURE:  After full consent was obtained, the patient was  brought to the catheterization laboratory.  Right lower extremity was  prepped and draped.  A 6 French arterial sheath was placed using modified  Seldinger technique.  Subsequently, a 6 [place] JR4 standard Judkins  catheters were used to engage left and right coronary ostium.  Ventriculopathy was performed with a 6 French angle pigtail catheter.  Appropriate left-sided hemodynamics were obtained.  Left ventriculography  was performed to obtain RAO projections using powering injections of  contrast.  At the termination of the procedure, all catheters and sheaths  were removed and the patient was brought back to the holding area.   FINDINGS:  1. Hemodynamics:  Left ventricular pressure 122/17 mmHg; aortic pressure     122/74 mmHg.  2. Ventriculography:  Ejection fraction 50%.  No segmental wall motion     abnormality.  No mitral regurgitation.  3. Selective coronary angiography:  Left main coronary was a large caliber  vessel with no evidence of flow-limiting disease.  Left anterior     descending artery demonstrated an ostial 20% stenosis and a mid-30 to 40%     diffuse stenosis.  The diagonal branch was free of flow-limiting disease.     Circumflex coronary artery and its side branch has no evidence of flow-     limiting coronary artery disease.  Right coronary artery did not     demonstrate significant flow-limiting coronary artery disease.  The     posterolateral branches and the posterior descending LV were also free of     flow-limiting disease.  4. Aortography:  There was also no evidence of an abdominal aortic aneurysm.    RECOMMENDATIONS:  Continue medical therapy.  Anticipate the patient can be  discharged later today.  Recommendations have been given to discontinue  tobacco and lipid lowering has been recommended.  Learta Codding, M.D. [facility]    GED/MEDQ  D:  02/20/2002  T:  02/20/2002  Job:  [id number]   cc:   Vale Haven. Andrey Campanile, M.D.  [address]  [place]  [place] [address]  Fax: [phone]   Olga Millers, M.D. [facility]

## 2010-07-12 NOTE — Discharge Summary (Signed)
NAME:  James [name], James [name] NO.:  [id number]   MEDICAL RECORD NO.:  [id number]                   PATIENT TYPE:  INP   LOCATION:  0363                                 FACILITY:  [facility]   PHYSICIAN:  Hedwig Morton. Juanda Chance, M.D. [facility]            DATE OF BIRTH:  10/02/1945   DATE OF ADMISSION:  11/16/2001  DATE OF DISCHARGE:  11/19/2001                                 DISCHARGE SUMMARY   ADMISSION DIAGNOSES:  29. A 65 year old male with microcytic anemia, heme-positive stool in the     setting of recurrent anemia rule out recurrence of sigmoid villous     adenoma with oozing versus peptic ulcer disease or gastritis.  2. Intermittent chest pain somewhat atypical rule out anemia-induced angina.  3. Junctional rhythm on presentation now converted to sinus.  4. Smoker.  5. Glaucoma.  6. History of a large tubovillous adenoma of the sigmoid colon with last     colonoscopy March 2003.  7. Status post cholecystectomy.  8. Status post shoulder repair x2 on the right and right knee arthroscopy.   DISCHARGE DIAGNOSES:  1. Microcytic anemia iron deficient with heme-positive stool--source not     clear with negative endoscopy this admission and colonoscopy showing     diverticulosis and two tiny polyps but no recurrence of a villous     adenoma, also negative small-bowel follow-through.  Cannot rule out a     small bowel atriovenous malformation.  2. Atypical chest pain with negative Cardiolite and 2-D echocardiogram felt     noncardiac.  3. Junctional rhythm on presentation now converted to sinus.  4. Smoker.  5. Glaucoma.  6. History of a large tubovillous adenoma of the sigmoid colon with last     colonoscopy March 2003.  7. Status post cholecystectomy.  8. Status post shoulder repair x2 on the right and right knee arthroscopy.   CONSULTATIONS:  Cardiology, Learta Codding, M.D.   PROCEDURES:  Upper endoscopies, flexible sigmoidoscopy, and small-bowel   follow-through.   BRIEF HISTORY:  The patient is a pleasant 65 year old white male well-known  to [place] C. Andrey Campanile, M.D. also to Ulyess Mort, M.D. with history as  described above.  The patient had undergone colonoscopy in November of 2002  for evaluation of anemia and heme-positive stool.  At that time, was found  to have a 3.6 cm sessile polyp in the sigmoid colon which was partially  removed piecemeal.  Also had a smaller transverse colon polyp removed.  Pathology on the large polyp was consistent with a tubulovillous adenoma.  A  repeat colonoscopy was done in March of 2003 showing a 2.4 cm polyp in the  sigmoid colon which was completely removed.  Again, pathology consistent  with a tubulovillous polyp, no malignancy.  Plan was for followup  colonoscopy in one year.  Hemoglobin in January of 2003 was 14.7.   The patient  called our office the day prior to admission with complaints of  weakness, occasional lightheadedness, intermittent chest pain over the past  3-4 weeks, and was wondering if he was losing blood.  He came in for a CBC  which showed a hemoglobin of 9, MCV of 60.  He was then asked to come to the  emergency room for further evaluation.  He has been unaware of any melena or  hematochezia, had no complaints of abdominal discomfort, and said his bowel  movements had been normal, appetite had been fine, but he has been feeling  fatigued with some exertional shortness of breath and some intermittent  substernal chest pain which had been occurring 3-4 times daily lasting  anywhere from 2-8 minutes.  This was not severe, but associated with  dyspnea.  It has been occurring at rest and at times with exertion.  He was  seen and evaluated in the emergency room, had a hemoglobin of 8.3, heme-  positive stool, was initially in a junctional rhythm and then converted to a  sinus rhythm, and was admitted to the hospital for further diagnostic  evaluation.   LABORATORY AND  ACCESSORY DATA:  On September 23rd, WBC 6.4, hemoglobin 8.3,  hematocrit 27.2, MCV 60, platelets 385.  Serial values were obtained.  On  September 26th, hemoglobin was 10, hematocrit 31.3, pro time 12.4, PTT 27.  Electrolytes within normal limits, glucose 122, albumin 3.9, liver functions  normal.  CKs were negative x3, troponin was elevated at 0.05 x2, a second  troponin normal at 0.02.  Lipid profile showed an cholesterol of 160,  triglycerides of 65, HDL 39.  Iron of 11, TIBC 431, iron saturation of 3,  CEA was 2.0, C-reactive protein 0.4.  The patient was transfused two units  of packed RBCs.   A 2-D echocardiogram showed left ventricular size normal, left ventricle not  well visualized, ejection fraction of 55-65%, right ventricular systolic  function mildly reduced.   RADIOGRAPHIC STUDIES:  A small-bowel follow-through on September 26th was  normal with somewhat rapid transit.  A Cardiolite on September 24th showed  no evidence of ischemia or a significant scar.  A CT scan of the chest on  September 25th showed emphysematous changes in the lungs, no acute findings.   HOSPITAL COURSE:  The patient was admitted to the service of Hedwig Morton. Juanda Chance,  M.D. who was covering the hospital.  He was placed on IV Protonix, continued  on his home medications.  We asked cardiology to see him in consultation  because of some T-wave changes noted on initial EKG.  One troponin was  slightly elevated, CKs were negative.  He was scheduled for a stress  Cardiolite which was done and was negative for ischemia, also for a 2-D echo  with findings as above.  Cardiac workup was negative.  His chest pain was  felt to be noncardiac and did not recur while in the hospital.  The patient  was then scheduled for upper endoscopy and flexible sigmoidoscopy done per  Dr. Lina Sar on September 24th.  Endoscopy was negative for a source of  bleeding.  Biopsies were taken of the duodenal nodule.  This came  back showing fragments of a tubular adenoma, no high-grade dysplasia, and he was  also noted to have two small polyps in the left colon both of which were  ablated with hot biopsies.  Pathology on those are consistent, again, with  tubular adenomas.  He also had diverticulosis in  the left colon.  He then  underwent small-bowel follow-through the following day which was negative  and with no evidence for active bleeding, stable post initial transfusions.   FOLLOW UP:  He was allowed discharge to home on September 26th with  instructions to follow up with Dr. Victorino Dike on October 31st at 2:30 p.m.   DIET:  Regular.  No seeds, popcorn, etc., secondary to diverticular disease.   MEDICATIONS:  1. Nexium 40 mg q.d.  2. Zoloft 50 mg q.d.  3. He was told to avoid aspirin and Aleve, NSAIDs for the time being.  4. Eye drops for glaucoma as previous.  5. B12 supplement daily.  6. Ferrous sulfate 325 mg t.i.d.    DISCHARGE INSTRUCTIONS:  He was to have his hemoglobin checked in one week  and will give consideration to capsule endoscopy as an outpatient to further  evaluate the small bowel.  Also would plan followup colonoscopy in one year  or less depending on his hemoglobin stability.     Amy Esterwood, P.A.-C. [facility]                Dora M. Juanda Chance, M.D. [facility]    AE/MEDQ  D:  11/25/2001  T:  11/26/2001  Job:  [id number]

## 2010-07-12 NOTE — Consult Note (Signed)
NAME:  James [name], James [name] NO.:  [id number]   MEDICAL RECORD NO.:  [id number]                   PATIENT TYPE:  INP   LOCATION:  0103                                 FACILITY:  [facility]   PHYSICIAN:  Learta Codding, M.D. [facility]             DATE OF BIRTH:  05-14-1945   DATE OF CONSULTATION:  11/16/2001  DATE OF DISCHARGE:                                   CONSULTATION   CURRENT COMPLAINTS:  Atypical chest pain and GI bleeding.   HISTORY OF PRESENT ILLNESS:  The patient is a 65 year old white male with  multiple risk factors for coronary artery disease, but no known CAD.  The  patient has been admitted due to profound anemia with occult GI bleeding.  On admission his hemoglobin is 8.3.  The patient has a long-standing history  of colonic polyps and anemia requiring iron replacement therapy.  His  cardiac risk factors include hypercholesterolemia, age, sex, and possibly  family history of CAD.  He reported today on admission nonexertional  atypical fleeting chest pain in the left chest radiating into the left  shoulder.  Last episode prior to today was several weeks ago which felt like  a sharp left-sided stabbing sensation.  He does report increased weakness  and dyspnea on exertion, but denies exertional chest pain.  Is able to walk  up a flight of stairs without complaint of chest tightness.  He currently  denies shortness of breath at rest.  He has no palpitations, syncope,  orthopnea, or PND.  His electrocardiogram on admission is essentially normal  short of nonspecific T-wave inversions in the inferior leads.   ALLERGIES:  CODEINE and MORPHINE SULFATE, but no iodine or shellfish  allergy.   MEDICATIONS:  1. Lumigan.  2. Zoloft 50 mg q.d.  3. Nexium 40 mg q.d.  4. Aspirin 81 mg q.d.   PAST MEDICAL HISTORY:  1. History of polypectomy (colonic).  2. History of cholecystectomy.  3. Knee arthroscopy.  4. Right shoulder surgery x2.  5. History of  glaucoma.   SOCIAL HISTORY:  The patient lives in [place] with his wife who is an  Facilities manager.  She is originally from [place], [place].  The patient,  however, is from [place].  He works in [employer] as a  Merchandiser, retail.  He has a +80-pack-year history of tobacco.  Denies alcohol.   FAMILY HISTORY:  No coronary artery disease.   REVIEW OF SYMPTOMS:  No fever, chills.  No headache or sore throat.  No rash  or lesions.  Positive for chest pain which is nonexertional which is  outlined above and dyspnea on exertion.  No cough or wheezing.  No  frequency, dysuria.  No weakness or numbness.  No myalgias or arthralgias.  No nausea or vomiting.  No polyuria or polydipsia.   PHYSICAL EXAMINATION:  VITAL SIGNS:  Blood pressure 133/80,  heart rate 80  beats per minute, respirations 18, saturation 99% on room air.  GENERAL:  A 65 year old white male in no apparent distress.  HEENT:  NCAT.  PERRLA.  NECK:  Supple.  No bruits.  No JVD.  HEART:  Regular rate and rhythm.  Normal S1, S2.  No S3 or S4.  Diminished  heart sounds.  LUNGS:  Clear, but diminished at the bases.  SKIN:  No rash or lesions.  ABDOMEN:  Soft, nontender.  No bruits.  GENITOURINARY:  Deferred.  RECTAL:  Deferred.  EXTREMITIES:  No clubbing, cyanosis, edema.  NEUROLOGIC:  The patient is alert, oriented, and grossly nonfocal.   LABORATORIES:  Chest x-ray is pending.  EKG:  Ectopic atrial rhythm with  small Q-waves in 2, 3, and aVF which are nonpathologic and nonspecific T-  wave inversions in inferior leads.  Hemoglobin 8.3, MCV 60, white count 6.4,  hematocrit 27.2, platelet count 385,000.  BUN 14, creatinine 0.8, sodium  138, potassium 3.6, glucose 122.  CK 70, CK-MB 1.6, troponin 0.05.  INR 0.9.   IMPRESSION AND PLAN:  1. Nonexertional atypical chest pain.  The patient does have risk factor for     coronary artery disease but his chest pain is very atypical.  He is     currently pain-free.  Plan is  to rule out the patient for myocardial     infarction.  Do a multimarker assessment including high sensitivity, CRP     and BNP.  If these are within normal limits the patient can proceed for     diagnostic purposes as well as part of possible preoperative evaluation     with exercise Cardiolite testing.  If this is negative would continue     medical therapy.  However, if this is positive consideration needs to be     given to cardiac catheterization.  The patient at the present time     exhibits low risk features for acute coronary syndrome.  2. Ectopic atrial rhythm.  Continue to monitor this, but no symptoms and no     hypotension.  3. Anemia.  Markedly iron deficient with low MCV.  Further evaluation per     gastrointestinal, but would recommend a transfusion, particularly in the     setting of rule out myocardial infarction.  The plan is to proceed with     this later today.   DISPOSITION:  Anticipate patient will rule out for myocardial infarction and  we can proceed with a Cardiolite study in the morning.                                               Learta Codding, M.D. [facility]    GED/MEDQ  D:  11/16/2001  T:  11/16/2001  Job:  [id number]   cc:   Vale Haven. Andrey Campanile, M.D.   Ulyess Mort, M.D. [facility]    Cardiology

## 2010-07-12 NOTE — Op Note (Signed)
   NAME:  James [name], James [name] NO.:  [id number]   MEDICAL RECORD NO.:  [id number]                   PATIENT TYPE:  OUT   LOCATION:  NUC                                  FACILITY:  [facility]   PHYSICIAN:  Hedwig Morton. Juanda Chance, M.D. [facility]            DATE OF BIRTH:  [birth year]   DATE OF PROCEDURE:  11/18/2001  DATE OF DISCHARGE:                                 OPERATIVE REPORT   PROCEDURE:  Flexible sigmoidoscopy.   FINDINGS:  Olympus single-channel videoscope passed under direct vision  through the rectum to the sigmoid colon.  The patient was monitored by pulse  oximetry.  His oxygen saturation was satisfactory.  The patient underwent  Fleets enema prep.  Anal canal and rectal ampulla was normal.  There were no  hemorrhoids.  There were a few scattered diverticula in the sigmoid colon.  According to the colonoscopy report, a large 36 mm polyp, which was found by  Dr. Corinda Gubler in the fall, was located in descending colon.  However,  examining the sigmoid and descending colon, I was unable to find the area of  the previous polyp.  Endoscope traversed through the splenic flexure,  through the transverse colon, up to the level of 130 cm, but no polyp was  found.  There were two incidental polyps found, one at 60 cm, one at 80 cm.  Each measured about 5 mm, and both were ablated with biopsies.  Colonoscope  was then retracted back to the sigmoid colon and again, advanced back to the  transverse colon and back again, but I could not appreciate any residual of  the large polyp, indicating that either it was removed or was located in the  right column.  The patient tolerated the procedure well.  Colonoscopic did  not reach cecum because patient was prepped only for flexible sigmoidoscopy.   IMPRESSION:  1. No residual of the old polyp.  2. Diverticulosis of the colon.  3. Two small polyps, 60 and 80 cm, both removed.   PLAN:  1. Small-bowel follow-through tomorrow.  2.  Infuse iron.  3. Obtain CT scan of the chest for further evaluation of chest pain.  4. Hemoccults and H&H to further monitor the gastrointestinal blood loss.     If the bleeding continues, the patient will undoubtedly need another     colonoscopy all the way to the cecum.                                               Hedwig Morton. Juanda Chance, M.D. [facility]    DMB/MEDQ  D:  11/18/2001  T:  11/18/2001  Job:  [id number]   cc:   Ulyess Mort, M.D. [facility]

## 2010-07-12 NOTE — Op Note (Signed)
NAME:  James [name], James [name] NO.:  [id number]   MEDICAL RECORD NO.:  [id number]                   PATIENT TYPE:  OUT   LOCATION:  NUC                                  FACILITY:  [facility]   PHYSICIAN:  Hedwig Morton. Juanda Chance, M.D. [facility]            DATE OF BIRTH:  1945-10-31   DATE OF PROCEDURE:  DATE OF DISCHARGE:                                 OPERATIVE REPORT   PROCEDURE:  Upper endoscopy and flexible sigmoidoscopy.   INDICATIONS:  This 65 year old white male was admitted through the emergency  room with a hemoglobin of 7 grams and Hemoccult positive stool.  He had a  large colon polyp which was removed on two separate colonoscopies, one in  the fall of 2002 and one in March 2003.  He drinks alcohol on the weekends.  He smokes two packs a day and takes aspirin 81 mg q.d.  He has been followed  by Dr. Margrett Rud.  He was found to be severely iron deficient with iron  saturation of 3% and Hemoccult positive stool.  He was felt to need upper  endoscopy and flexible sigmoidoscopy to assess a large polyps that was  removed from sigmoid colon by Dr. Corinda Gubler recently.   ENDOSCOPIC ASSESSMENT:  Olympus single-channel scope.   SEDATION:  Versed 5 mg IV, Demerol 50 mg IV.   FINDINGS:  Olympus single-channel video endoscope passed through the teeth,  through posterior pharynx, into esophagus.  The patient was monitored by  pulse oximetry.  His oxygen saturations were normal.  Approximate distal  esophageal mucosa was unremarkable.  Squamocolumnar junction was normal.  There was no hiatal hernia.   Stomach:  The stomach was entered and showed normal gastric folds, gastric  antrum, and pyloric outlet.  Retroflexion of endoscope revealed normal  fundus and cardia.   Duodenum:  Duodenum was normal, but the papilla was visualized and  descending duodenum distal to the papilla were some thickened folds with  whitish discoloration but almost like an early polyp.  Biopsies  were taken  from this area to completely remove this polypoid configuration.  It  measured about 3-5 mm.  No other abnormality was found.   IMPRESSION:  Essentially normal upper endoscopy of the esophagus, stomach,  and duodenum, status post biopsies of duodenal nodule    Flexible sigmoidoscopy.   FINDINGS:  Olympus single-channel videoscope passed from rectum to the  sigmoid colon.  Patient was monitored by pulse oximetry.  His oxygen  saturations were slightly saturated.  The patient underwent Fleets enema  prep.  Anal canal, rectal ampulla was normal.  There were no hemorrhoids.  There were a few scattered diverticula in the  sigmoid colon.  According to the colonoscopy report, a large 36 mm polyp,  which was found by Dr. Corinda Gubler in the fall) was located in descending colon.  However, examining the sigmoid and descending colon, I was  unable to find  the area of the previous polyp.                                               Hedwig Morton. Juanda Chance, M.D. [facility]    DMB/MEDQ  D:  11/18/2001  T:  11/18/2001  Job:  [id number]   cc:   Ulyess Mort, M.D. [facility]

## 2010-07-12 NOTE — H&P (Signed)
NAME:  James [name], BULMAN NO.:  [id number]   MEDICAL RECORD NO.:  [id number]          PATIENT TYPE:  EMS   LOCATION:  MAJO                         FACILITY:  [facility]   PHYSICIAN:  Madolyn Frieze. Jens Som, MD, FACCDATE OF BIRTH:  [birth year]   DATE OF ADMISSION:  02/09/2006  DATE OF DISCHARGE:                              HISTORY & PHYSICAL   PRIMARY CARE PHYSICIAN:  Dr. Artis Flock.   PRIMARY GI:  Dr. Victorino Dike.   PRIMARY CARDIOLOGIST:  Previously Dr. Lewayne Bunting.   PATIENT PROFILE:  [age]-year-old Caucasian male with history of  nonobstructive CAD who presents with:  1. Chest pain/nonobstructive coronary artery disease.      a.     February 12, 2002, cardiac catheterization left main, normal       LAD, 20% ostial, 30-40% mid.  Left circumflex normal.  RCA normal.       EF 50%.  2. Ongoing tobacco abuse with an 86+ pack year history.  He is      currently smoking 2 packs per day.  3. Glaucoma.  4. History of tubulovillous adenoma of the sigmoid colon, status post      polypectomy, not seen on followup colonoscopy in September of 2003.  5. Status post cholecystectomy.  6. Status post right shoulder surgery x2.  7. Status post knee arthroscopy.   HISTORY OF PRESENT ILLNESS:  [age]-year-old Caucasian male with history  of nonobstructive coronary artery disease by catheterization December of  2003.  He has an 86+ pack year history of tobacco abuse and this is  ongoing at 2 packs per day.  He was in his usual state of health until  this morning when he woke at 4:30 a.m. with 10/10 epigastric and mid  sternal chest pain, worse with deep breathing and lying flat.  He took  1200 mg of Motrin and sat up and restarted with some relief of  discomfort, down to 6/10 after approximately 30 minutes.  Patient has  remained at a 6/10 level since approximately 5 a.m. without change with  exertion.  Because of ongoing discomfort, he saw Dr. Artis Flock and was  referred to the ED.  He continues  to complain of 6/10 chest pain, which  is worse with deep breathing and lying flat, although he otherwise  appears comfortable.  His ECG is without any acute changes.   ALLERGIES:  MORPHINE AND CODEINE CAUSE ITCHING.   HOME MEDICATIONS:  1. Zoloft 100 mg daily.  2. Multivitamin daily.  3. Xopenex q.i.d.  4. Alphagan 0.3% one drop OU b.i.d.   FAMILY HISTORY:  Mother died of COPD at 59.  Father died of COPD and CAD  at 64.  He has 2 half sisters, 1 half brother and 1 sister who are all  alive and well.   SOCIAL HISTORY:  He lives in [place] with his wife.  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  He has an 86 pack year history of tobacco  abuse, currently smoking 2 packs  per day.  He rarely has an alcoholic  drink and smoked marijuana when he was in the [employer] in his 39s.  He does  not exercise, but is active around the house and the yard.   REVIEW OF SYSTEMS:  Positive for chest pain, as outlined in the HPI.  Otherwise, all systems reviewed and negative.   PHYSICAL EXAMINATION:  VITAL SIGNS:  Temperature 97.6.  Heart rate 74.  Respirations 16.  Blood pressure 140/82.  GENERAL:  Pleasant, white male in no acute distress.  Awake, alert and  oriented x3.  HEENT:  Atraumatic, normocephalic.  NEURO:  Grossly intact, nonfocal.  SKIN:  Warm and dry without lesions or masses.  NECK:  Normal carotid upstrokes.  No bruits or JVD.  LUNGS:  Respirations regular and unlabored with loud bilateral  inspiratory and expiratory rhonchi throughout.  CARDIAC:  Irregular distant S1 and S2.  No S3, S4 or murmurs.  No rubs.  ABDOMEN:  Round, soft, nontender, nondistended.  Bowel sounds present  x4.  EXTREMITIES:  Warm and dry.  Pink.  No clubbing, cyanosis or edema.  Dorsalis pedis and posterior tibial pulses 1+ and equal bilaterally.   Chest x-ray is pending.  EKG shows sinus rhythm with a first degree AV  block and  normal access.  Rate is 75 beats per minute and no acute ST/T  changes.   Lab work is pending.   ASSESSMENT/PLAN:  1. Chest pain that was pleuritic in component.  We will rule out      myocardial infarction, which seems unlikely given the prolonged      duration of his discomfort without ECG changes.  We will check a D-      dimer, as well as an echocardiogram to rule out pericarditis.  I      will add aspirin and an NSAIDs and if cardiac enzymes, D-dimer and      echocardiogram are within normal limits plan to discharge tomorrow      with outpatient Myoview followup.  2. Tobacco abuse.  Eighty-six pack year history, still smoking 2 packs      per day.  Cessation is strongly advised and patient is willing to      try Chantix.  3. Glaucoma.  Continue home eye drops.      Nicolasa Ducking, ANP      Madolyn Frieze. Jens Som, MD, [facility]  Electronically Signed    CB/MEDQ  D:  02/09/2006  T:  02/10/2006  Job:  [id number]

## 2010-11-28 LAB — BLOOD GAS, ARTERIAL
Acid-Base Excess: 1.4 mmol/L (ref 0.0–2.0)
Bicarbonate: 25.9 mEq/L — ABNORMAL HIGH (ref 20.0–24.0)
FIO2: 0.21 %
Patient temperature: 98.6
pCO2 arterial: 42.4 mmHg (ref 35.0–45.0)
pH, Arterial: 7.403 (ref 7.350–7.450)

## 2010-11-29 LAB — CBC
HCT: 48.2 % (ref 39.0–52.0)
Hemoglobin: 16.8 g/dL (ref 13.0–17.0)
MCV: 92.3 fL (ref 78.0–100.0)
RDW: 13.8 % (ref 11.5–15.5)

## 2010-11-29 LAB — BASIC METABOLIC PANEL
CO2: 31 mEq/L (ref 19–32)
Chloride: 107 mEq/L (ref 96–112)
GFR calc Af Amer: >60 mL/min (ref >60)
GFR calc non Af Amer: >60 mL/min (ref >60)
Glucose, Bld: 92 mg/dL (ref 70–99)
Sodium: 143 mEq/L (ref 135–145)

## 2010-12-05 LAB — BASIC METABOLIC PANEL
Calcium: 9.3
Chloride: 106
Creatinine, Ser: 0.92
GFR calc non Af Amer: >60

## 2010-12-05 LAB — HEMOGLOBIN A1C
Hgb A1c MFr Bld: 5.6
Mean Plasma Glucose: 122

## 2010-12-05 LAB — CBC
HCT: 50.8
Hemoglobin: 17.5 — ABNORMAL HIGH
MCV: 93.3
RBC: 5.44
RDW: 13
WBC: 7.1

## 2010-12-05 LAB — PROTIME-INR: Prothrombin Time: 12.7

## 2010-12-05 LAB — DIFFERENTIAL: Lymphocytes Relative: 25

## 2010-12-31 ENCOUNTER — Encounter: Payer: Self-pay | Admitting: Gastroenterology

## 2011-02-07 ENCOUNTER — Ambulatory Visit (AMBULATORY_SURGERY_CENTER): Payer: 59 | Admitting: *Deleted

## 2011-02-07 ENCOUNTER — Encounter: Payer: Self-pay | Admitting: Gastroenterology

## 2011-02-07 VITALS — Ht 74.0 in | Wt 225.0 lb

## 2011-02-07 DIAGNOSIS — Z1211 Encounter for screening for malignant neoplasm of colon: Secondary | ICD-10-CM

## 2011-02-07 MED ORDER — PEG-KCL-NACL-NASULF-NA ASC-C 100 G PO SOLR: ORAL | Status: DC

## 2011-02-13 ENCOUNTER — Other Ambulatory Visit: Payer: Self-pay | Admitting: Gastroenterology

## 2011-02-13 ENCOUNTER — Encounter: Payer: Self-pay | Admitting: Gastroenterology

## 2011-03-07 ENCOUNTER — Encounter: Payer: Self-pay | Admitting: Gastroenterology

## 2011-03-28 ENCOUNTER — Encounter: Payer: Self-pay | Admitting: Gastroenterology

## 2011-03-28 ENCOUNTER — Ambulatory Visit (AMBULATORY_SURGERY_CENTER): Payer: 59 | Admitting: Gastroenterology

## 2011-03-28 VITALS — BP 110/76 | HR 63 | Temp 97.8°F | Resp 15 | Ht 74.0 in | Wt 225.0 lb

## 2011-03-28 DIAGNOSIS — D126 Benign neoplasm of colon, unspecified: Secondary | ICD-10-CM

## 2011-03-28 DIAGNOSIS — Z86010 Personal history of colonic polyps: Secondary | ICD-10-CM

## 2011-03-28 DIAGNOSIS — Z1211 Encounter for screening for malignant neoplasm of colon: Secondary | ICD-10-CM

## 2011-03-28 HISTORY — DX: Benign neoplasm of colon, unspecified: D12.6

## 2011-03-28 MED ORDER — SODIUM CHLORIDE 0.9 % IV SOLN: 500.0000 mL | INTRAVENOUS | Status: DC

## 2011-03-28 NOTE — Progress Notes (Signed)
Patient did not experience any of the following events: a burn prior to discharge; a fall within the facility; wrong site/side/patient/procedure/implant event; or a hospital transfer or hospital admission upon discharge from the facility. (G8907) Patient did not have preoperative order for IV antibiotic SSI prophylaxis. (G8918)  

## 2011-03-28 NOTE — Op Note (Signed)
[facility] [address] [place], [place]  [address]  COLONOSCOPY PROCEDURE REPORT  PATIENT:  James, [name]  MR#:  [id number] BIRTHDATE:  Jul 16, 1945, 65 yrs. old  GENDER:  male ENDOSCOPIST:  Judie Petit T. Russella Dar, MD, [facility]  PROCEDURE DATE:  03/28/2011 PROCEDURE:  Colonoscopy with snare polypectomy ASA CLASS:  Class II INDICATIONS:  1) surveillance and high-risk screening  2) history of pre-cancerous (adenomatous) colon polyps: villous adenoma with HGD, 07/2006. MEDICATIONS:   MAC sedation, administered by CRNA, propofol (Diprivan) 250 mg IV DESCRIPTION OF PROCEDURE:   After the risks benefits and alternatives of the procedure were thoroughly explained, informed consent was obtained.  Digital rectal exam was performed and revealed no abnormalities.   The LB 180AL [id number] endoscope was introduced through the anus and advanced to the cecum, which was identified by both the appendix and ileocecal valve, without limitations.  The quality of the prep was adequate, using MoviPrep.  The instrument was then slowly withdrawn as the colon was fully examined. <<PROCEDUREIMAGES>> FINDINGS:  A sessile polyp was found in the cecum. It was 2 cm in size on the medial wall and extended to the IC valve. Polyp was snared, then cauterized with monopolar cautery. Retrieval was successful. Piecemeal polypectomy with 80-90 % of polyp removed. A sessile polyp was found in the descending colon. It was 5 mm in size. Polyp was snared without cautery. Retrieval was successful. There was evidence of a prior segmental colectomy in the left colon. Moderate diverticulosis was found in the descending colon. Otherwise normal colonoscopy without other polyps, masses, vascular ectasias, or inflammatory changes.   Retroflexed views in the rectum revealed internal and external hemorrhoids.  The time to cecum =  1.75  minutes. The scope was then withdrawn (time = 16.5  min) from the patient and the procedure  completed.  COMPLICATIONS:  None  ENDOSCOPIC IMPRESSION: 1) 2 cm sessile polyp in the cecum 2) 5 mm sessile polyp in the descending colon 3) Prior segmental colectomy in the sigmoid colon 4) Moderate diverticulosis in the descending colon 5) Internal and external hemorrhoids  RECOMMENDATIONS: 1) Hold aspirin, aspirin products, and anti-inflammatory medication for 2 weeks. 2) Await pathology results 3) High fiber diet with liberal fluid intake. 4) Repeat Colonoscopy in 6 months pending pathology review with a more extensive bowel prep.  Venita Lick. Russella Dar, MD, [facility]  CC:  Romero Liner, MD  n. Rosalie DoctorVenita Lick. Marigold Mom at 03/28/2011 09:34 AM  Ronney Asters, [phone]

## 2011-03-28 NOTE — Patient Instructions (Signed)
FOLLOW DISCHARGE INSTRUCTIONS (BLUE & GREEN SHEETS).  Repeat colonoscopy 6 mos. Await pathology report.  HOLD ASPIRIN & ANTI INFLAMMATORY MEDICATIONS X 2 WEEKS  INFORMATION ON POLYPS , DIVERTICULOSIS, HEMORRHOIDS, & HIGH FIBER DIET GIVEN TO YOU.

## 2011-03-31 ENCOUNTER — Telehealth: Payer: Self-pay | Admitting: *Deleted

## 2011-03-31 NOTE — Telephone Encounter (Signed)
No answer. Left message to call if questions or concerns. 

## 2011-04-01 ENCOUNTER — Encounter: Payer: Self-pay | Admitting: Gastroenterology

## 2011-04-02 DIAGNOSIS — Z79899 Other long term (current) drug therapy: Secondary | ICD-10-CM | POA: Diagnosis not present

## 2011-04-09 DIAGNOSIS — L570 Actinic keratosis: Secondary | ICD-10-CM | POA: Diagnosis not present

## 2011-04-09 DIAGNOSIS — N390 Urinary tract infection, site not specified: Secondary | ICD-10-CM | POA: Diagnosis not present

## 2011-04-09 DIAGNOSIS — M899 Disorder of bone, unspecified: Secondary | ICD-10-CM | POA: Diagnosis not present

## 2011-04-09 DIAGNOSIS — E0520 Thyrotoxicosis with toxic multinodular goiter without thyrotoxic crisis or storm: Secondary | ICD-10-CM | POA: Diagnosis not present

## 2011-04-09 DIAGNOSIS — J449 Chronic obstructive pulmonary disease, unspecified: Secondary | ICD-10-CM | POA: Diagnosis not present

## 2011-04-09 DIAGNOSIS — R079 Chest pain, unspecified: Secondary | ICD-10-CM | POA: Diagnosis not present

## 2011-05-14 DIAGNOSIS — R0602 Shortness of breath: Secondary | ICD-10-CM | POA: Diagnosis not present

## 2011-05-29 DIAGNOSIS — Z0000 Encounter for general adult medical examination without abnormal findings: Secondary | ICD-10-CM | POA: Diagnosis not present

## 2011-05-29 DIAGNOSIS — E7800 Pure hypercholesterolemia, unspecified: Secondary | ICD-10-CM | POA: Diagnosis not present

## 2011-05-29 DIAGNOSIS — R079 Chest pain, unspecified: Secondary | ICD-10-CM | POA: Diagnosis not present

## 2011-05-29 DIAGNOSIS — L570 Actinic keratosis: Secondary | ICD-10-CM | POA: Diagnosis not present

## 2011-06-03 DIAGNOSIS — Z0000 Encounter for general adult medical examination without abnormal findings: Secondary | ICD-10-CM | POA: Diagnosis not present

## 2011-06-18 DIAGNOSIS — G4733 Obstructive sleep apnea (adult) (pediatric): Secondary | ICD-10-CM | POA: Diagnosis not present

## 2011-07-04 DIAGNOSIS — H409 Unspecified glaucoma: Secondary | ICD-10-CM | POA: Diagnosis not present

## 2011-07-04 DIAGNOSIS — H4011X0 Primary open-angle glaucoma, stage unspecified: Secondary | ICD-10-CM | POA: Diagnosis not present

## 2011-07-04 DIAGNOSIS — H2510 Age-related nuclear cataract, unspecified eye: Secondary | ICD-10-CM | POA: Diagnosis not present

## 2011-07-23 DIAGNOSIS — H409 Unspecified glaucoma: Secondary | ICD-10-CM | POA: Diagnosis not present

## 2011-07-23 DIAGNOSIS — H4011X0 Primary open-angle glaucoma, stage unspecified: Secondary | ICD-10-CM | POA: Diagnosis not present

## 2011-09-08 DIAGNOSIS — H4011X0 Primary open-angle glaucoma, stage unspecified: Secondary | ICD-10-CM | POA: Diagnosis not present

## 2011-09-08 DIAGNOSIS — H2510 Age-related nuclear cataract, unspecified eye: Secondary | ICD-10-CM | POA: Diagnosis not present

## 2011-09-08 DIAGNOSIS — H409 Unspecified glaucoma: Secondary | ICD-10-CM | POA: Diagnosis not present

## 2011-10-07 DIAGNOSIS — M899 Disorder of bone, unspecified: Secondary | ICD-10-CM | POA: Diagnosis not present

## 2011-10-07 DIAGNOSIS — E7800 Pure hypercholesterolemia, unspecified: Secondary | ICD-10-CM | POA: Diagnosis not present

## 2011-10-07 DIAGNOSIS — Z79899 Other long term (current) drug therapy: Secondary | ICD-10-CM | POA: Diagnosis not present

## 2011-10-07 DIAGNOSIS — I10 Essential (primary) hypertension: Secondary | ICD-10-CM | POA: Diagnosis not present

## 2011-10-07 DIAGNOSIS — M949 Disorder of cartilage, unspecified: Secondary | ICD-10-CM | POA: Diagnosis not present

## 2011-11-25 DIAGNOSIS — E7800 Pure hypercholesterolemia, unspecified: Secondary | ICD-10-CM | POA: Diagnosis not present

## 2011-11-25 DIAGNOSIS — Z79899 Other long term (current) drug therapy: Secondary | ICD-10-CM | POA: Diagnosis not present

## 2011-11-27 DIAGNOSIS — R42 Dizziness and giddiness: Secondary | ICD-10-CM | POA: Diagnosis not present

## 2011-11-27 DIAGNOSIS — E7800 Pure hypercholesterolemia, unspecified: Secondary | ICD-10-CM | POA: Diagnosis not present

## 2011-11-27 DIAGNOSIS — L821 Other seborrheic keratosis: Secondary | ICD-10-CM | POA: Diagnosis not present

## 2011-11-27 DIAGNOSIS — R7309 Other abnormal glucose: Secondary | ICD-10-CM | POA: Diagnosis not present

## 2012-02-25 DIAGNOSIS — G20A1 Parkinson's disease without dyskinesia, without mention of fluctuations: Secondary | ICD-10-CM

## 2012-02-25 DIAGNOSIS — G20 Parkinson's disease: Secondary | ICD-10-CM

## 2012-02-25 HISTORY — DX: Parkinson's disease: G20

## 2012-02-25 HISTORY — DX: Parkinson's disease without dyskinesia, without mention of fluctuations: G20.A1

## 2012-05-14 DIAGNOSIS — D509 Iron deficiency anemia, unspecified: Secondary | ICD-10-CM | POA: Diagnosis not present

## 2012-05-14 DIAGNOSIS — N529 Male erectile dysfunction, unspecified: Secondary | ICD-10-CM | POA: Diagnosis not present

## 2012-05-14 DIAGNOSIS — M949 Disorder of cartilage, unspecified: Secondary | ICD-10-CM | POA: Diagnosis not present

## 2012-05-14 DIAGNOSIS — Z79899 Other long term (current) drug therapy: Secondary | ICD-10-CM | POA: Diagnosis not present

## 2012-05-14 DIAGNOSIS — M899 Disorder of bone, unspecified: Secondary | ICD-10-CM | POA: Diagnosis not present

## 2012-05-14 DIAGNOSIS — Z125 Encounter for screening for malignant neoplasm of prostate: Secondary | ICD-10-CM | POA: Diagnosis not present

## 2012-05-14 DIAGNOSIS — E7800 Pure hypercholesterolemia, unspecified: Secondary | ICD-10-CM | POA: Diagnosis not present

## 2012-05-20 DIAGNOSIS — D509 Iron deficiency anemia, unspecified: Secondary | ICD-10-CM | POA: Diagnosis not present

## 2012-06-01 ENCOUNTER — Other Ambulatory Visit: Payer: Self-pay | Admitting: Registered Nurse

## 2012-06-07 DIAGNOSIS — H2510 Age-related nuclear cataract, unspecified eye: Secondary | ICD-10-CM | POA: Diagnosis not present

## 2012-06-07 DIAGNOSIS — H409 Unspecified glaucoma: Secondary | ICD-10-CM | POA: Diagnosis not present

## 2012-06-07 DIAGNOSIS — H4011X0 Primary open-angle glaucoma, stage unspecified: Secondary | ICD-10-CM | POA: Diagnosis not present

## 2012-06-08 ENCOUNTER — Encounter: Payer: Self-pay | Admitting: Pulmonary Disease

## 2012-06-08 ENCOUNTER — Ambulatory Visit (INDEPENDENT_AMBULATORY_CARE_PROVIDER_SITE_OTHER): Payer: 59 | Admitting: Pulmonary Disease

## 2012-06-08 ENCOUNTER — Ambulatory Visit (INDEPENDENT_AMBULATORY_CARE_PROVIDER_SITE_OTHER)
Admission: RE | Admit: 2012-06-08 | Discharge: 2012-06-08 | Disposition: A | Discharge status: Home / Self Care | Payer: 59 | Source: Ambulatory Visit | Attending: Pulmonary Disease | Admitting: Pulmonary Disease

## 2012-06-08 VITALS — BP 124/76 | HR 65 | Temp 98.0°F | Ht 73.0 in | Wt 239.4 lb

## 2012-06-08 DIAGNOSIS — J439 Emphysema, unspecified: Secondary | ICD-10-CM

## 2012-06-08 DIAGNOSIS — R0902 Hypoxemia: Secondary | ICD-10-CM | POA: Insufficient documentation

## 2012-06-08 DIAGNOSIS — J438 Other emphysema: Secondary | ICD-10-CM

## 2012-06-08 DIAGNOSIS — G4733 Obstructive sleep apnea (adult) (pediatric): Secondary | ICD-10-CM

## 2012-06-08 IMAGING — CR DG CHEST 2V
2 series · 2 of 2 positions shown · non-contrast
Comparison: [DATE]

CLINICAL DATA: COPD

CHEST - 2 VIEW

[view not recorded (1 of 2)]
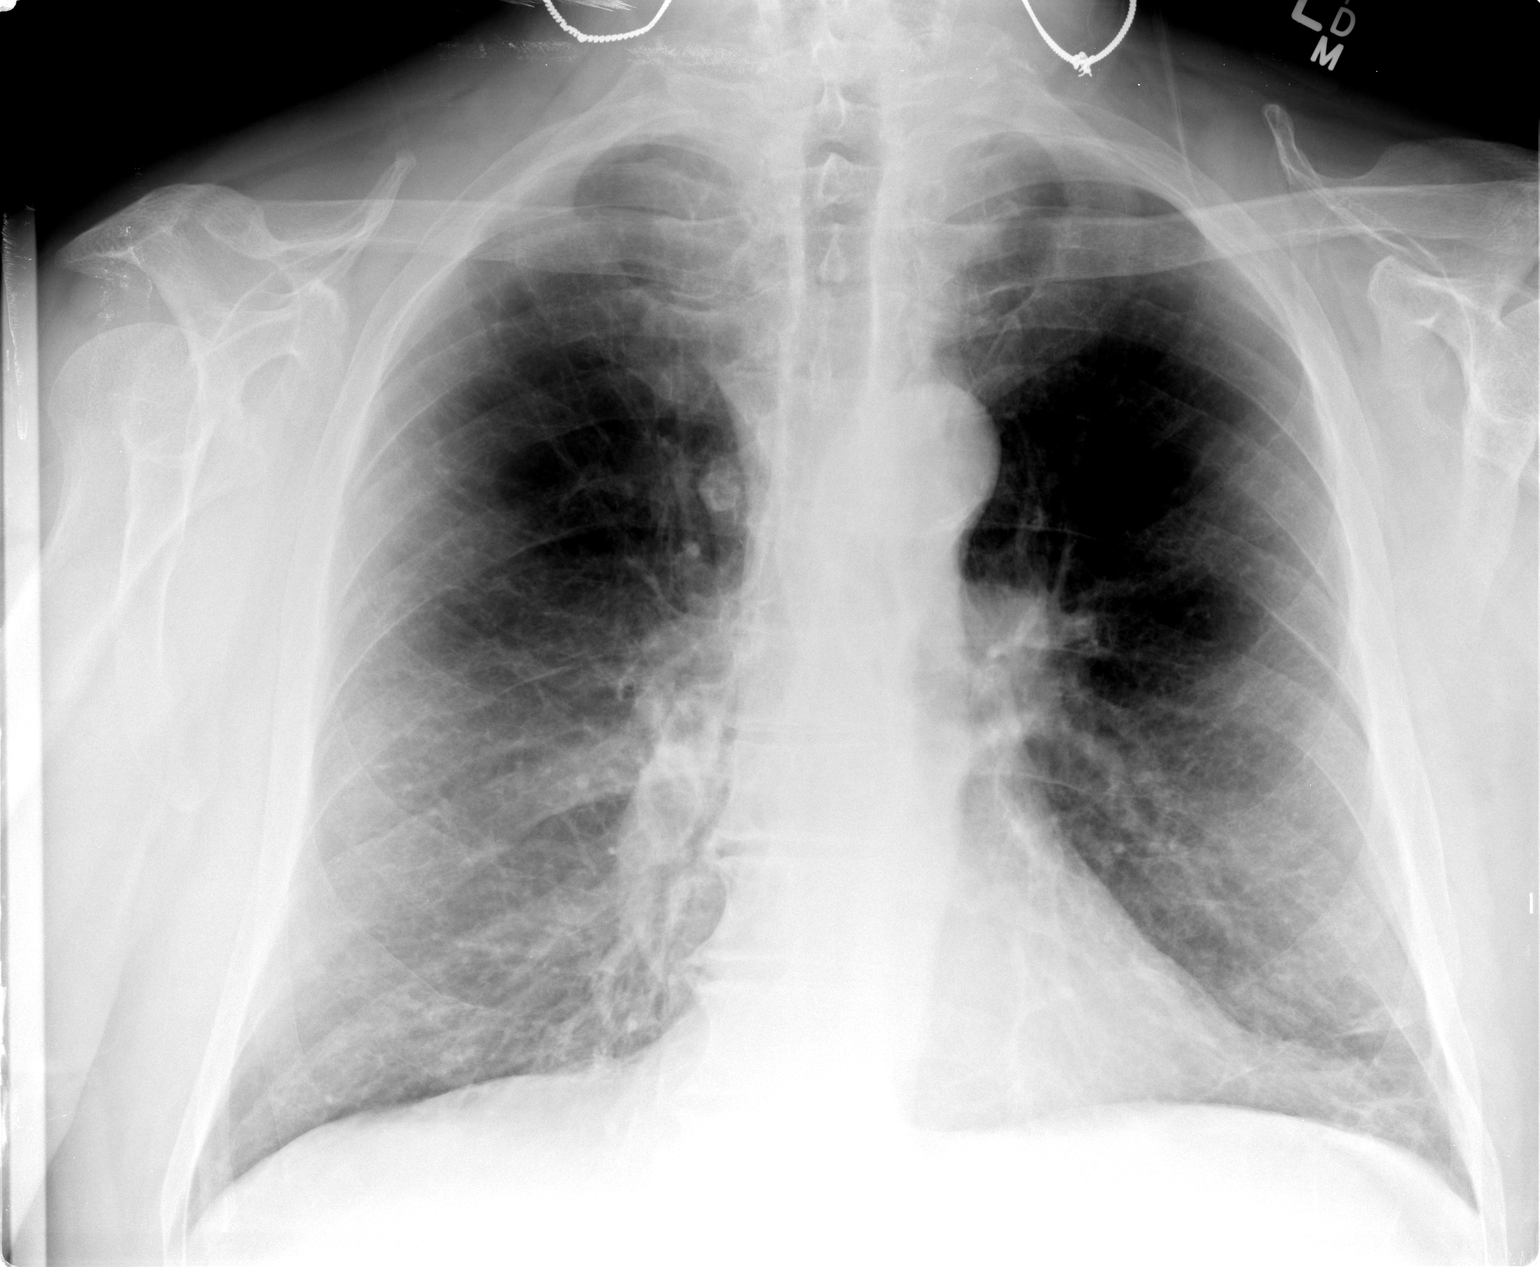

[view not recorded (2 of 2)]
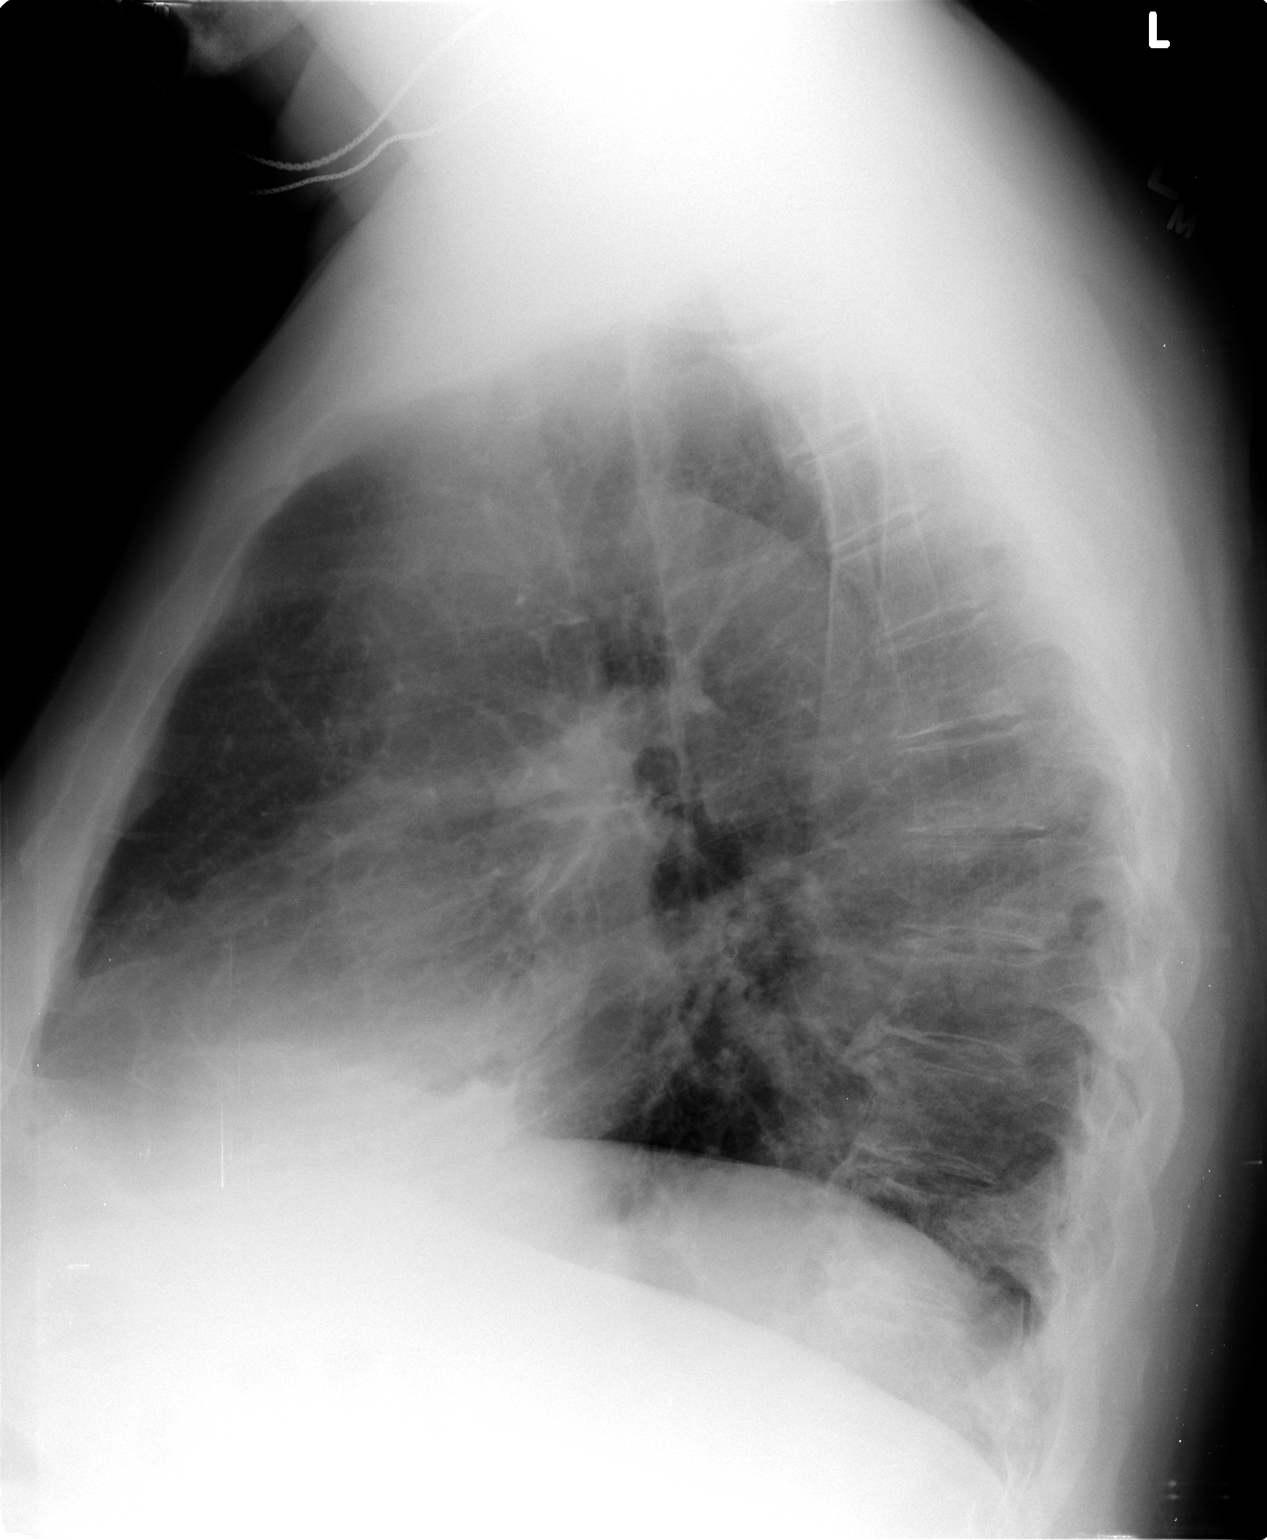

[2 of 2 positions shown; findings below may reference images not displayed]

FINDINGS: There is underlying emphysema.  There is scarring in the
left base.  There is no edema or consolidation. There is mild
interstitial prominence in the lower lobes which probably reflects
redistribution of blood flow to viable segments of lung given the
underlying emphysema.  The heart size is normal.  The pulmonary
vascular reflects underlying emphysema.  No adenopathy.  There is
degenerative change in the thoracic spine.
IMPRESSION: Underlying emphysema.  No edema or consolidation.  Mild scarring
left base.

## 2012-06-08 NOTE — Progress Notes (Deleted)
  Subjective:    Patient ID: James [name], male    DOB: 09-05-1945, 67 y.o.   MRN: [id number]  HPI    Review of Systems  Constitutional: Negative for appetite change and unexpected weight change.  HENT: Negative for ear pain, congestion, sore throat, sneezing, trouble swallowing and dental problem.   Respiratory: Positive for cough and shortness of breath.   Cardiovascular: Negative for chest pain, palpitations and leg swelling.  Gastrointestinal: Negative for nausea and abdominal pain.  Musculoskeletal: Negative for joint swelling.  Skin: Negative for rash.  Neurological: Negative for headaches.  Psychiatric/Behavioral: Positive for dysphoric mood. The patient is not nervous/anxious.        Objective:   Physical Exam        Assessment & Plan:

## 2012-06-08 NOTE — Progress Notes (Signed)
Chief Complaint  Patient presents with  . Advice Only    Chronic insomnia. Had sleep study in the fall.     History of Present Illness: James [name] is a 67 y.o. male smoker for evaluation of sleep problems and emphysema.  He has noticed trouble with his sleep for several years.  He had a sleep study in 2009 which showed mild sleep apnea. He was tried on CPAP, but unable to tolerate this.  He reports having another sleep study in Fall of 2013, but results of this are not available.  He continues to snore.  He goes to sleep at 1130 pm.  He falls asleep quickly.  He wakes up at 2 am to get a drink of water.  He goes back to sleep after 15 minutes.  He wakes up again at 430 am to use the bathroom.  He gets out of bed at 7 am.  He feels tired in the morning.  He denies morning headache.  He does not use anything to help him fall sleep or stay awake.  He can fall asleep easily during the day when he is sitting quietly.  He denies sleep walking, sleep talking, bruxism, or nightmares.  There is no history of restless legs.  He denies sleep hallucinations, sleep paralysis, or cataplexy.  The Epworth score is 8 out of 24.  He used to drink alcohol, but quit this about 1 year ago.  His sleep has improved since he stopped drinking alcohol.  He quit smoking 6 weeks ago.  He used to get frequent episodes of coughing, sputum, and wheeze.  He still gets these, but not as much as when he was smoking.  He has been told he has emphysema.  He does not feel like his breathing limits his activity level.  He has not had recent chest xray or breathing test.  He has not been using inhaler medications.  Tests: PSG 01/25/08 >> RDI 6.8, SpO2 low 84%.  Spent 79% of test time with SpO2 < 90% Spirometry 06/08/12 >> FEV1 1.91 (49%), FEV1% 59  James [name]  has a past medical history of Allergy; Blood transfusion; COPD (chronic obstructive pulmonary disease); Depression; Hyperlipidemia; Blind left eye; Tubular  adenoma of colon (03/2011); Diverticulosis; Internal and external bleeding hemorrhoids; Osteoarthritis; Hypertension; Insomnia; Iron deficiency anemia; Rosacea; ED (erectile dysfunction); and Glaucoma.  James [name]  has past surgical history that includes Partial colectomy (2008); Clavicle surgery (1993 & 1994); Knee arthroscopy (1996); Cholecystectomy; and Tonsillectomy.  Prior to Admission medications   Medication Sig Start Date End Date Taking? Authorizing Provider  atorvastatin (LIPITOR) 20 MG tablet Take 1 tablet by mouth Daily. 01/20/11  Yes Historical Provider, MD  buPROPion (WELLBUTRIN XL) 300 MG 24 hr tablet Take 1 tablet by mouth Daily. 01/10/11  Yes Historical Provider, MD  calcium gluconate 500 MG tablet Take 500 mg by mouth 2 (two) times daily.   Yes Historical Provider, MD  CINNAMON PO Take 1 capsule by mouth daily.     Yes Historical Provider, MD  fish oil-omega-3 fatty acids 1000 MG capsule Take 1 g by mouth daily.     Yes Historical Provider, MD  Chilton Si Tea, Camillia sinensis, (GREEN TEA PO) Take by mouth daily.   Yes Historical Provider, MD  guaiFENesin (MUCINEX) 600 MG 12 hr tablet Take 1,200 mg by mouth as needed for congestion.   Yes Historical Provider, MD  hydrochlorothiazide (HYDRODIURIL) 25 MG tablet Take 1 tablet by mouth Daily. 01/29/11  Yes Historical Provider, MD  LUMIGAN 0.01 % SOLN Place 1 drop into the right eye Daily. 11/26/10  Yes Historical Provider, MD  metroNIDAZOLE (METROCREAM) 0.75 % cream Apply topically as needed.   Yes Historical Provider, MD  Multiple Vitamins-Minerals (MULTIVITAMIN WITH MINERALS) tablet Take 1 tablet by mouth daily.     Yes Historical Provider, MD  pantoprazole (PROTONIX) 40 MG tablet Take 40 mg by mouth daily.   Yes Historical Provider, MD  sertraline (ZOLOFT) 100 MG tablet Take 1 tablet by mouth Daily. 01/26/11  Yes Historical Provider, MD  vardenafil (LEVITRA) 20 MG tablet Take 20 mg by mouth daily.   Yes Historical Provider, MD   vitamin E (VITAMIN E) 400 UNIT capsule Take 400 Units by mouth daily.     Yes Historical Provider, MD    Allergies  Allergen Reactions  . Codeine Itching  . Morphine Itching    His family history includes Asthma in his mother; CAD in his father; COPD in his mother; and Cancer in his maternal grandmother.  He  reports that he quit smoking about 5 weeks ago. His smoking use included Cigarettes. He has a 75 pack-year smoking history. He has never used smokeless tobacco. He reports that  drinks alcohol. He reports that he does not use illicit drugs.  Review of Systems  Constitutional: Negative for appetite change and unexpected weight change.  HENT: Negative for ear pain, congestion, sore throat, sneezing, trouble swallowing and dental problem.   Respiratory: Positive for cough and shortness of breath.   Cardiovascular: Negative for chest pain, palpitations and leg swelling.  Gastrointestinal: Negative for nausea and abdominal pain.  Musculoskeletal: Negative for joint swelling.  Skin: Negative for rash.  Neurological: Negative for headaches.  Psychiatric/Behavioral: Positive for dysphoric mood. The patient is not nervous/anxious.    Physical Exam:  General - No distress ENT - No sinus tenderness, nasal septal deviation, MP 3, no oral exudate, no LAN, no thyromegaly, TM clear Cardiac - s1s2 regular, no murmur, pulses symmetric Chest - No wheeze/rales/dullness, good air entry, normal respiratory excursion Back - No focal tenderness Abd - Soft, non-tender, no organomegaly, + bowel sounds Ext - No edema Neuro - Normal strength, cranial nerves intact Skin - No rashes Psych - Normal mood, and behavior

## 2012-06-08 NOTE — Patient Instructions (Signed)
Breathing test (spiromerty) and chest xray today Will schedule overnight oxygen test Will get copy of more recent sleep study Follow up in 3 to 4 weeks

## 2012-06-09 ENCOUNTER — Telehealth: Payer: Self-pay | Admitting: Pulmonary Disease

## 2012-06-09 MED ORDER — BUDESONIDE-FORMOTEROL FUMARATE 160-4.5 MCG/ACT IN AERO: 2.0000 | INHALATION_SPRAY | Freq: Two times a day (BID) | RESPIRATORY_TRACT | Status: DC

## 2012-06-09 NOTE — Assessment & Plan Note (Signed)
He has extensive history of smoking.  Spirometry today shows moderate severe obstruction.  He feels his respiratory symptoms have improved since he stopped smoking.  Will arrange for chest xray today.  Encouraged him to keep up with his smoking cessation efforts.  Will re-assess his status at next visit, and then determine if he would benefit from trial of inhaler therapy.  Will need to ensure that he has received pneumococcal vaccination, and will need to advise him to get annual influenza vaccination.  Will also need to check alpha 1 anti-trypsin level, and assess whether he is a candidate for pulmonary rehab.

## 2012-06-09 NOTE — Telephone Encounter (Signed)
Dg Chest 2 View  06/08/2012  *RADIOLOGY REPORT*  Clinical Data: COPD  CHEST - 2 VIEW  Comparison:  October 17, 2009  Findings:  There is underlying emphysema.  There is scarring in the left base.  There is no edema or consolidation. There is mild interstitial prominence in the lower lobes which probably reflects redistribution of blood flow to viable segments of lung given the underlying emphysema.  The heart size is normal.  The pulmonary vascular reflects underlying emphysema.  No adenopathy.  There is degenerative change in the thoracic spine.  IMPRESSION: Underlying emphysema.  No edema or consolidation.  Mild scarring left base.   Original Report Authenticated By: Bretta Bang, M.D.     Spirometry 06/08/12 >> FEV1 1.91 (49%), FEV1% 59   Results d/w pt.  Explained he has moderate to severe COPD with emphysema.  He has hx of glaucoma.  Will give him trial of symbicort 160/4.5 two puffs bid.  Script sent to his pharmacy.  Advised him to rinse mouth after each use.

## 2012-06-09 NOTE — Assessment & Plan Note (Signed)
He has prior history of sleep apnea, but his previous sleep study only showed mild sleep apnea.  He did have significant, prolonged oxygen desaturation during prior sleep study.  He has been intolerant of CPAP, and is reluctant to consider using this again.  He does have history of hypertension.  He continues to have snoring, sleep disruption, and daytime sleepiness.  Will arrange for overnight oximetry first.  Based on this will determine if he would need repeat in lab sleep study.

## 2012-06-15 ENCOUNTER — Other Ambulatory Visit (INDEPENDENT_AMBULATORY_CARE_PROVIDER_SITE_OTHER): Payer: 59

## 2012-06-15 ENCOUNTER — Ambulatory Visit (INDEPENDENT_AMBULATORY_CARE_PROVIDER_SITE_OTHER): Payer: 59 | Admitting: Gastroenterology

## 2012-06-15 ENCOUNTER — Encounter: Payer: Self-pay | Admitting: Gastroenterology

## 2012-06-15 VITALS — BP 138/80 | HR 80 | Ht 74.0 in | Wt 241.0 lb

## 2012-06-15 DIAGNOSIS — D649 Anemia, unspecified: Secondary | ICD-10-CM

## 2012-06-15 DIAGNOSIS — Z86010 Personal history of colonic polyps: Secondary | ICD-10-CM

## 2012-06-15 LAB — FERRITIN: Ferritin: 14.7 ng/mL — ABNORMAL LOW (ref 22.0–322.0)

## 2012-06-15 LAB — VITAMIN B12: Vitamin B-12: 777 pg/mL (ref 211–911)

## 2012-06-15 LAB — PROTIME-INR: INR: 1 ratio (ref 0.8–1.0)

## 2012-06-15 LAB — IBC PANEL
Iron: 29 ug/dL — ABNORMAL LOW (ref 42–165)
Saturation Ratios: 7 % — ABNORMAL LOW (ref 20.0–50.0)

## 2012-06-15 MED ORDER — PEG-KCL-NACL-NASULF-NA ASC-C 100 G PO SOLR: 1.0000 | Freq: Once | ORAL | Status: DC

## 2012-06-15 NOTE — Patient Instructions (Addendum)
Your physician has requested that you go to the basement for the following lab work before leaving today: Anemia panel.  You have been scheduled for an endoscopy and colonoscopy with propofol. Please follow the written instructions given to you at your visit today. Please pick up your prep at the pharmacy within the next 1-3 days. If you use inhalers (even only as needed), please bring them with you on the day of your procedure. Your physician has requested that you go to www.startemmi.com and enter the access code given to you at your visit today. This web site gives a general overview about your procedure. However, you should still follow specific instructions given to you by our office regarding your preparation for the procedure.  Thank you for choosing me and [facility].  Venita Lick. Pleas Koch., MD., [facility]  cc: Merri Brunette, MD

## 2012-06-15 NOTE — Progress Notes (Signed)
History of Present Illness: This is a 67 year old male with a prior history of tubulovillous adenomatous colon polyps and a history of a 2 cm cecal adenomatous colon polyp removed by piecemeal in 03/2011. A six-month followup colonoscopy was recommended which did not occur. He was recently found to have a mild microcytic anemia by Dr. Renne Crigler: Hb=11.8, MCV=79.6. He has no gastrointestinal complaints. Denies weight loss, abdominal pain, constipation, diarrhea, change in stool caliber, melena, hematochezia, nausea, vomiting, dysphagia, reflux symptoms, chest pain.  Current Medications, Allergies, Past Medical History, Past Surgical History, Family History and Social History were reviewed in [employer] record.  Physical Exam: General: Well developed , well nourished, no acute distress Head: Normocephalic and atraumatic Eyes:  sclerae anicteric, EOMI Ears: Normal auditory acuity Mouth: No deformity or lesions Lungs: Clear throughout to auscultation Heart: Regular rate and rhythm; no murmurs, rubs or bruits Abdomen: Soft, non tender and non distended. No masses, hepatosplenomegaly or hernias noted. Normal Bowel sounds Rectal: Deferred to colonoscopy Musculoskeletal: Symmetrical with no gross deformities  Pulses:  Normal pulses noted Extremities: No clubbing, cyanosis, edema or deformities noted Neurological: Alert oriented x 4, grossly nonfocal Psychological:  Alert and cooperative. Normal mood and affect  Assessment and Recommendations:  1. Microcytic anemia. Rule out iron deficiency. Obtain standard anemia workup and stool Hemoccults. Schedule colonoscopy and upper endoscopy.  The risks, benefits, and alternatives to endoscopy with possible biopsy and possible dilation were discussed with the patient and they consent to proceed. The risks, benefits, and alternatives to colonoscopy with possible biopsy, possible polypectomy and possible injection of hemorrhoids were discussed  with the patient and they consent to proceed.   2. Personal history of tubulovillous adenomatous colon polyps and a history of a piecemeal polypectomy of 2 cm cecal tubular adenoma in 03/2011. Colonoscopy as above.

## 2012-06-30 ENCOUNTER — Ambulatory Visit (AMBULATORY_SURGERY_CENTER): Payer: Medicare Other | Admitting: Gastroenterology

## 2012-06-30 ENCOUNTER — Encounter: Payer: Self-pay | Admitting: Gastroenterology

## 2012-06-30 VITALS — BP 119/71 | HR 58 | Temp 98.1°F | Resp 15 | Ht 74.0 in | Wt 241.0 lb

## 2012-06-30 DIAGNOSIS — B370 Candidal stomatitis: Secondary | ICD-10-CM

## 2012-06-30 DIAGNOSIS — D509 Iron deficiency anemia, unspecified: Secondary | ICD-10-CM

## 2012-06-30 DIAGNOSIS — D126 Benign neoplasm of colon, unspecified: Secondary | ICD-10-CM

## 2012-06-30 DIAGNOSIS — Z860100 Personal history of colon polyps, unspecified: Secondary | ICD-10-CM | POA: Diagnosis not present

## 2012-06-30 MED ORDER — SODIUM CHLORIDE 0.9 % IV SOLN: 500.0000 mL | INTRAVENOUS | Status: DC

## 2012-06-30 MED ORDER — FLUCONAZOLE 100 MG PO TABS: 100.0000 mg | ORAL_TABLET | Freq: Every day | ORAL | Status: DC

## 2012-06-30 NOTE — Op Note (Signed)
[facility] [address] [place], [address]   ENDOSCOPY PROCEDURE REPORT  PATIENT: James [name], James [name]  MR#: [id number] BIRTHDATE: 10/01/1945 , 66  yrs. old GENDER: Male ENDOSCOPIST: Meryl Dare, MD, [facility] PROCEDURE DATE:  06/30/2012 PROCEDURE:  EGD, diagnostic ASA CLASS:     Class II INDICATIONS:  Iron deficiency anemia. MEDICATIONS: MAC sedation, administered by CRNA, There was residual sedation effect present from prior procedure, and propofol (Diprivan) 150mg  IV TOPICAL ANESTHETIC: none DESCRIPTION OF PROCEDURE: After the risks benefits and alternatives of the procedure were thoroughly explained, informed consent was obtained.  The LB [id number]-H180 [id number] endoscope was introduced through the mouth and advanced to the second portion of the duodenum without limitations.  The instrument was slowly withdrawn as the mucosa was fully examined.   ESOPHAGUS: White exudates consistent with candidiasis were found in the upper third of the esophagus.   The esophagus was otherwise normal. STOMACH: The mucosa and folds of the stomach appeared normal. DUODENUM: The duodenal mucosa showed no abnormalities in the bulb and second portion of the duodenum.  Retroflexed views revealed a small hiatal hernia.  The scope was then withdrawn from the patient and the procedure completed.  COMPLICATIONS: There were no complications.  ENDOSCOPIC IMPRESSION: 1.   White exudates consistent with candidiasis 2.   Small hiatal hernia  RECOMMENDATIONS: 1.  No source for Fe deficiency noted, Fe replacement and follow up with PCP 2.  Diflucan 100mg  po qd, #7   eSigned:  Meryl Dare, MD, [facility] 06/30/2012 2:39 PM   WN:[id number] D Renne Crigler, MD

## 2012-06-30 NOTE — Progress Notes (Signed)
Patient did not experience any of the following events: a burn prior to discharge; a fall within the facility; wrong site/side/patient/procedure/implant event; or a hospital transfer or hospital admission upon discharge from the facility. (G8907) Patient did not have preoperative order for IV antibiotic SSI prophylaxis. (G8918)  

## 2012-06-30 NOTE — Progress Notes (Signed)
Called to room to assist during endoscopic procedure.  Patient ID and intended procedure confirmed with present staff. Received instructions for my participation in the procedure from the performing physician.  

## 2012-06-30 NOTE — Op Note (Signed)
[facility] [address] [place], [address]   COLONOSCOPY PROCEDURE REPORT  PATIENT: James [name], James [name]  MR#: [id number] BIRTHDATE: [birth year] , 66  yrs. old GENDER: Male ENDOSCOPIST: Meryl Dare, MD, [facility] PROCEDURE DATE:  06/30/2012 PROCEDURE:   Colonoscopy with snare polypectomy ASA CLASS:   Class II INDICATIONS:Patient's personal history of adenomatous colon polyps, Follow up piecemeal polypectomy, and Iron Deficiency Anemia. MEDICATIONS: MAC sedation, administered by CRNA and propofol (Diprivan) 250mg  IV DESCRIPTION OF PROCEDURE:   After the risks benefits and alternatives of the procedure were thoroughly explained, informed consent was obtained.  A digital rectal exam revealed no abnormalities of the rectum.   The LB [id number]  endoscope was introduced through the anus and advanced to the cecum, which was identified by both the appendix and ileocecal valve. No adverse events experienced.   The quality of the prep was adequate, using MoviPrep  The instrument was then slowly withdrawn as the colon was fully examined.  COLON FINDINGS: A sessile polyp measuring 6 mm in size was found at the cecum.  A polypectomy was performed with a cold snare.  The resection was complete and the polyp tissue was completely retrieved.   Mild diverticulosis was noted in the descending colon. Prior left colectomy with a normal appearing anastomosis.   The colon was otherwise normal.  There was no diverticulosis, inflammation, polyps or cancers unless previously stated. Retroflexed views revealed small internal hemorrhoids. The time to cecum=1 minutes 38 seconds.  Withdrawal time=12 minutes 23 seconds. The scope was withdrawn and the procedure completed.  COMPLICATIONS: There were no complications.  ENDOSCOPIC IMPRESSION: 1.   Sessile polyp measuring 6 mm at the cecum; polypectomy performed with a cold snare 2.   Mild diverticulosis was noted in the descending  colon 3.   Prior left colectomy with a normal appearing anastomosis 4.   Small internal hemorrhoids  RECOMMENDATIONS: 1.  Await pathology results 2.  High fiber diet with liberal fluid intake. 3.  Repeat Colonoscopy in 3 years.  eSigned:  Meryl Dare, MD, [facility] 06/30/2012 2:35 PM   cc: Romero Liner, MD

## 2012-06-30 NOTE — Patient Instructions (Addendum)

## 2012-07-01 ENCOUNTER — Telehealth: Payer: Self-pay | Admitting: *Deleted

## 2012-07-01 NOTE — Telephone Encounter (Signed)
  Follow up Call-  Call back number 06/30/2012 03/28/2011  Post procedure Call Back phone  # [phone]  Permission to leave phone message Yes Yes     Patient questions:  Do you have a fever, pain , or abdominal swelling? no Pain Score  0 *  Have you tolerated food without any problems? yes  Have you been able to return to your normal activities? yes  Do you have any questions about your discharge instructions: Diet   no Medications  no Follow up visit  no  Do you have questions or concerns about your Care? no  Actions: * If pain score is 4 or above: No action needed, pain <4.

## 2012-07-06 DIAGNOSIS — I440 Atrioventricular block, first degree: Secondary | ICD-10-CM | POA: Diagnosis not present

## 2012-07-07 ENCOUNTER — Encounter: Payer: Self-pay | Admitting: Pulmonary Disease

## 2012-07-07 ENCOUNTER — Ambulatory Visit (INDEPENDENT_AMBULATORY_CARE_PROVIDER_SITE_OTHER): Payer: 59 | Admitting: Pulmonary Disease

## 2012-07-07 ENCOUNTER — Encounter: Payer: Self-pay | Admitting: Gastroenterology

## 2012-07-07 ENCOUNTER — Other Ambulatory Visit: Payer: Medicare Other

## 2012-07-07 VITALS — BP 140/80 | HR 70 | Temp 98.7°F | Ht 74.0 in | Wt 239.0 lb

## 2012-07-07 DIAGNOSIS — J438 Other emphysema: Secondary | ICD-10-CM | POA: Diagnosis not present

## 2012-07-07 DIAGNOSIS — J439 Emphysema, unspecified: Secondary | ICD-10-CM

## 2012-07-07 DIAGNOSIS — R0902 Hypoxemia: Secondary | ICD-10-CM | POA: Diagnosis not present

## 2012-07-07 NOTE — Patient Instructions (Signed)
Stop symbicort Ventolin as needed for cough, wheeze, or chest congestion Will arrange for home oxygen set up Lab test today Will arrange for referral to pulmonary rehab Follow up in 3 months

## 2012-07-07 NOTE — Assessment & Plan Note (Signed)
He has moderate to severe COPD with emphysema.  He has not noticed benefit from symbicort >> will d/c this, and monitor his symptoms.  He is to continue prn ventolin.  He can't use spiriva due to hx of glaucoma.  Will check A1AT level, and arrange for pulmonary rehab.  He gets his flu shot annually, and had his pneumonia shot 3 years ago.

## 2012-07-07 NOTE — Progress Notes (Signed)
Chief Complaint  Patient presents with  . 4 wk follow up    Reports breathing is unchanged.  Has DOE, chest tightness, and occas cough.  Cough is prod with small amount of yellow mucus.    History of Present Illness: James [name] is a 67 y.o. male former smoker with COPD/emphysema.  He has not smoked in the past 2 months.  He feels that this has helped his breathing.  He has been using symbicort, but doesn't feel like this helps much.  He is not using ventolin much either.   He has occasional cough and chest tightness.  His main trouble is getting winded with exertion.  He denies leg swelling.  TESTS: PSG 01/25/08 >> RDI 6.8, SpO2 low 84%.  Spent 79% of test time with SpO2 < 90% Spirometry 06/08/12 >> FEV1 1.91 (49%), FEV1% 59 CXR 06/08/12 >> changes of emphysema ONO with RA 06/16/12 >> Test time 9 hrs 21 min.  Mean SpO2 89%, low SpO2 63%.  Spent 2 hrs 25 min with SpO2 < 88% 07/07/12 >> SpO2 86% on room air with exertion >> start 2 liters oxygen with exertion and sleep  James [name]  has a past medical history of Allergy; Blood transfusion; COPD (chronic obstructive pulmonary disease); Depression; Hyperlipidemia; Blind left eye; Tubular adenoma of colon (03/2011); Diverticulosis; Internal and external bleeding hemorrhoids; Osteoarthritis; Hypertension; Insomnia; Iron deficiency anemia; Rosacea; ED (erectile dysfunction); and Glaucoma.  James [name]  has past surgical history that includes Partial colectomy (2008); Clavicle surgery (1993 & 1994); Knee arthroscopy (1996); Cholecystectomy; Tonsillectomy; and Thyroid cyst excision.  Prior to Admission medications   Medication Sig Start Date End Date Taking? Authorizing Provider  albuterol (VENTOLIN HFA) 108 (90 BASE) MCG/ACT inhaler  05/13/12   Historical Provider, MD  atorvastatin (LIPITOR) 20 MG tablet  05/29/12   Historical Provider, MD  brimonidine (ALPHAGAN) 0.2 % ophthalmic solution  06/22/12   Historical Provider, MD   budesonide-formoterol (SYMBICORT) 160-4.5 MCG/ACT inhaler Inhale 2 puffs into the lungs 2 (two) times daily. 06/09/12   Coralyn Helling, MD  buPROPion (WELLBUTRIN XL) 300 MG 24 hr tablet  05/31/12   Historical Provider, MD  calcium gluconate 500 MG tablet Take 500 mg by mouth 2 (two) times daily.    Historical Provider, MD  calcium-vitamin D (OSCAL WITH D) 250-125 MG-UNIT per tablet Take 1 tablet by mouth daily.    Historical Provider, MD  cholecalciferol (VITAMIN D) 400 UNITS TABS Take by mouth.    Historical Provider, MD  CINNAMON PO Take 1 capsule by mouth daily.      Historical Provider, MD  dorzolamide-timolol (COSOPT) 22.3-6.8 MG/ML ophthalmic solution  06/22/12   Historical Provider, MD  ferrous sulfate 325 (65 FE) MG tablet Take 325 mg by mouth daily with breakfast.    Historical Provider, MD  fluconazole (DIFLUCAN) 100 MG tablet Take 1 tablet (100 mg total) by mouth daily. 06/30/12   Meryl Dare, MD  Chilton Si Tea 150 MG CAPS Take by mouth daily. 04/02/07   Historical Provider, MD  hydrochlorothiazide (HYDRODIURIL) 25 MG tablet  06/06/12   Historical Provider, MD  metroNIDAZOLE (METROCREAM) 0.75 % cream Apply topically as needed.    Historical Provider, MD  Multiple Vitamins-Minerals (MULTIVITAMIN WITH MINERALS) tablet Take 1 tablet by mouth daily.      Historical Provider, MD  OMEGA-3 1000 MG CAPS Take 1 g by mouth daily. 04/02/07   Historical Provider, MD  sertraline (ZOLOFT) 100 MG tablet  06/07/12   Historical  Provider, MD  vardenafil (LEVITRA) 20 MG tablet Take 20 mg by mouth daily.    Historical Provider, MD    Allergies  Allergen Reactions  . Codeine Itching  . Morphine Itching     Physical Exam:  General - No distress ENT - No sinus tenderness, nasal septal deviation, MP 3, no oral exudate, no LAN Cardiac - s1s2 regular, no murmur Chest - Decrease breath sounds, no wheeze, prolonged exhalation Back - No focal tenderness Abd - Soft, non-tender Ext - No edema Neuro - Normal  strength Skin - No rashes Psych - normal mood, and behavior   Assessment/Plan:  Coralyn Helling, MD [facility] Pager:  [phone]

## 2012-07-07 NOTE — Assessment & Plan Note (Signed)
His SpO2 was 86% on room air with exertion.  Will arrange for 2 liters oxygen with exertion and sleep.

## 2012-07-08 DIAGNOSIS — I10 Essential (primary) hypertension: Secondary | ICD-10-CM | POA: Diagnosis not present

## 2012-07-08 DIAGNOSIS — D649 Anemia, unspecified: Secondary | ICD-10-CM | POA: Diagnosis not present

## 2012-07-12 ENCOUNTER — Telehealth: Payer: Self-pay | Admitting: Pulmonary Disease

## 2012-07-12 LAB — ALPHA-1 ANTITRYPSIN PHENOTYPE: A-1 Antitrypsin: 154 mg/dL (ref 83–199)

## 2012-07-12 NOTE — Telephone Encounter (Signed)
A1AT 07/07/12 >> 154, PI-MM  Will have my nurse inform pt that lab test was normal.  He does not have inherited condition that causes emphysema.  No change to current treatment plan.

## 2012-07-15 NOTE — Telephone Encounter (Signed)
lmomtcb x1 for pt 

## 2012-07-16 NOTE — Telephone Encounter (Signed)
Pt's wife returned call Advised of normal A1AT results as stated by VS below James [name] verbalized her understanding and denied any questions Nothing further needed; will sign off.

## 2012-07-16 NOTE — Telephone Encounter (Signed)
lmomtcb x 2  

## 2012-07-20 DIAGNOSIS — H4011X0 Primary open-angle glaucoma, stage unspecified: Secondary | ICD-10-CM | POA: Diagnosis not present

## 2012-07-20 DIAGNOSIS — J449 Chronic obstructive pulmonary disease, unspecified: Secondary | ICD-10-CM | POA: Diagnosis not present

## 2012-07-30 ENCOUNTER — Telehealth: Payer: Self-pay | Admitting: Pulmonary Disease

## 2012-07-30 NOTE — Telephone Encounter (Signed)
I spoke with pt. He stated he was told by Christoper Allegra that VS did not sign his O2 order and information sent over. He stated they told him if VS does not sign this then he would have to pay for his O2.  I called [employer] and spoke with Crystal. I was advised that VS face to face notes was not signed by VS and they need those with his actual signature and faxed over. I spoke with Almyra Free regarding this and she told me she never heard of this since it has electronic signature on it. She advised me to call and speak with carol.  I called [employer] back and carol was gone for the day but will be back on Monday. I will call Okey Regal on Monday. Pt aware we are working on this.

## 2012-08-02 NOTE — Telephone Encounter (Signed)
Sharyn Creamer, spoke with Crystal who reported that Lissa Hoard has been working on this case.  Was placed on hold for approx 2 mins waiting to speak with [facility]. Crystal returned to the line stating that Lissa Hoard was on the phone with a patient but stated that she had spoken with Almyra Free and this can be disregarded.  Lissa Hoard has not yet spoken to the patient. Spoke with Almyra Free who verified that she did speak with [place] regarding and it was discussed that Christoper Allegra can use electronic signatures Called spoke with patient and informed him that everything has been taken care of and Christoper Allegra will be contacting him about this.  Pt verbalized his understanding and denied any questions/concerns at this time. Will sign off.

## 2012-08-16 DIAGNOSIS — H4011X0 Primary open-angle glaucoma, stage unspecified: Secondary | ICD-10-CM | POA: Diagnosis not present

## 2012-08-16 DIAGNOSIS — IMO0002 Reserved for concepts with insufficient information to code with codable children: Secondary | ICD-10-CM | POA: Diagnosis not present

## 2012-08-18 DIAGNOSIS — Z5189 Encounter for other specified aftercare: Secondary | ICD-10-CM | POA: Diagnosis not present

## 2012-08-18 DIAGNOSIS — IMO0002 Reserved for concepts with insufficient information to code with codable children: Secondary | ICD-10-CM | POA: Diagnosis not present

## 2012-08-23 DIAGNOSIS — Z5189 Encounter for other specified aftercare: Secondary | ICD-10-CM | POA: Diagnosis not present

## 2012-08-23 DIAGNOSIS — IMO0002 Reserved for concepts with insufficient information to code with codable children: Secondary | ICD-10-CM | POA: Diagnosis not present

## 2012-08-23 DIAGNOSIS — H4011X0 Primary open-angle glaucoma, stage unspecified: Secondary | ICD-10-CM | POA: Diagnosis not present

## 2012-09-14 DIAGNOSIS — H44429 Hypotony of unspecified eye due to ocular fistula: Secondary | ICD-10-CM | POA: Diagnosis not present

## 2012-09-14 DIAGNOSIS — Z5189 Encounter for other specified aftercare: Secondary | ICD-10-CM | POA: Diagnosis not present

## 2012-11-04 ENCOUNTER — Ambulatory Visit (INDEPENDENT_AMBULATORY_CARE_PROVIDER_SITE_OTHER): Payer: 59 | Admitting: Pulmonary Disease

## 2012-11-04 ENCOUNTER — Encounter: Payer: Self-pay | Admitting: Pulmonary Disease

## 2012-11-04 VITALS — BP 132/76 | HR 70 | Temp 98.1°F | Ht 73.0 in | Wt 254.2 lb

## 2012-11-04 DIAGNOSIS — J438 Other emphysema: Secondary | ICD-10-CM

## 2012-11-04 DIAGNOSIS — R0902 Hypoxemia: Secondary | ICD-10-CM | POA: Diagnosis not present

## 2012-11-04 DIAGNOSIS — J439 Emphysema, unspecified: Secondary | ICD-10-CM

## 2012-11-04 NOTE — Assessment & Plan Note (Signed)
Discussed importance of using oxygen with activity.  Will arrange for portable pulse oximeter.  Will arrange for face mask to use for oxygen at night.  Explained that some of his night time breathing difficulties could be related to sleep apnea >> he will monitor his sleep pattern, and then decide if he wants repeat sleep testing.   

## 2012-11-04 NOTE — Progress Notes (Signed)
Chief Complaint  Patient presents with  . 3 month follow up    Reports breathing is unchanged.  Does have DOE, PND, and prod cough with yellow mucus.      History of Present Illness: James [name] is a 67 y.o. male former smoker with COPD/emphysema and hypoxemia needing 2 liters oxygen with exertion and sleep.  He has been doing okay.  He gets occasional cough and wheeze.  He is not having much sputum.  He denies chest pain, leg swelling, or cramps.  He does not feel is breathing limits his activities much.  He uses his oxygen at night, but not much during the day.  He does not need to use his albuterol much.  He has noticed more mouth breathing at night, and his mouth gets dry.  He does snore while asleep.  TESTS: PSG 01/25/08 >> RDI 6.8, SpO2 low 84%.  Spent 79% of test time with SpO2 < 90% Spirometry 06/08/12 >> FEV1 1.91 (49%), FEV1% 59 CXR 06/08/12 >> changes of emphysema ONO with RA 06/16/12 >> Test time 9 hrs 21 min.  Mean SpO2 89%, low SpO2 63%.  Spent 2 hrs 25 min with SpO2 < 88% 07/07/12 >> SpO2 86% on room air with exertion >> start 2 liters oxygen with exertion and sleep A1AT 07/07/12 >> 154, PI-MM  [name]  has a past medical history of Allergy; Blood transfusion; COPD (chronic obstructive pulmonary disease); Depression; Hyperlipidemia; Blind left eye; Tubular adenoma of colon (03/2011); Diverticulosis; Internal and external bleeding hemorrhoids; Osteoarthritis; Hypertension; Insomnia; Iron deficiency anemia; Rosacea; ED (erectile dysfunction); and Glaucoma.  James [name]  has past surgical history that includes Partial colectomy (2008); Clavicle surgery (1993 & 1994); Knee arthroscopy (1996); Cholecystectomy; Tonsillectomy; and Thyroid cyst excision.  Prior to Admission medications   Medication Sig Start Date End Date Taking? Authorizing Provider  albuterol (VENTOLIN HFA) 108 (90 BASE) MCG/ACT inhaler  05/13/12   Historical Provider, MD  atorvastatin (LIPITOR) 20  MG tablet  05/29/12   Historical Provider, MD  brimonidine (ALPHAGAN) 0.2 % ophthalmic solution  06/22/12   Historical Provider, MD  budesonide-formoterol (SYMBICORT) 160-4.5 MCG/ACT inhaler Inhale 2 puffs into the lungs 2 (two) times daily. 06/09/12   Coralyn Helling, MD  buPROPion (WELLBUTRIN XL) 300 MG 24 hr tablet  05/31/12   Historical Provider, MD  calcium gluconate 500 MG tablet Take 500 mg by mouth 2 (two) times daily.    Historical Provider, MD  calcium-vitamin D (OSCAL WITH D) 250-125 MG-UNIT per tablet Take 1 tablet by mouth daily.    Historical Provider, MD  cholecalciferol (VITAMIN D) 400 UNITS TABS Take by mouth.    Historical Provider, MD  CINNAMON PO Take 1 capsule by mouth daily.      Historical Provider, MD  dorzolamide-timolol (COSOPT) 22.3-6.8 MG/ML ophthalmic solution  06/22/12   Historical Provider, MD  ferrous sulfate 325 (65 FE) MG tablet Take 325 mg by mouth daily with breakfast.    Historical Provider, MD  fluconazole (DIFLUCAN) 100 MG tablet Take 1 tablet (100 mg total) by mouth daily. 06/30/12   Meryl Dare, MD  Chilton Si Tea 150 MG CAPS Take by mouth daily. 04/02/07   Historical Provider, MD  hydrochlorothiazide (HYDRODIURIL) 25 MG tablet  06/06/12   Historical Provider, MD  metroNIDAZOLE (METROCREAM) 0.75 % cream Apply topically as needed.    Historical Provider, MD  Multiple Vitamins-Minerals (MULTIVITAMIN WITH MINERALS) tablet Take 1 tablet by mouth daily.      Historical  Provider, MD  OMEGA-3 1000 MG CAPS Take 1 g by mouth daily. 04/02/07   Historical Provider, MD  sertraline (ZOLOFT) 100 MG tablet  06/07/12   Historical Provider, MD  vardenafil (LEVITRA) 20 MG tablet Take 20 mg by mouth daily.    Historical Provider, MD    Allergies  Allergen Reactions  . Codeine Itching  . Morphine Itching     Physical Exam:  General - No distress ENT - No sinus tenderness, nasal septal deviation, MP 3, no oral exudate, no LAN Cardiac - s1s2 regular, no murmur Chest - Decrease breath  sounds, no wheeze, prolonged exhalation Back - No focal tenderness Abd - Soft, non-tender Ext - No edema Neuro - Normal strength Skin - No rashes Psych - normal mood, and behavior   Assessment/Plan:  Coralyn Helling, MD [facility]/Critical Care/Sleep Pager:  [phone]

## 2012-11-04 NOTE — Patient Instructions (Signed)
Flu shot today Will arrange for portable oxygen meter Follow up in 6 months

## 2012-11-04 NOTE — Assessment & Plan Note (Signed)
Stable on prn albuterol.  Will get his flu shot today.  He is not able to use spiriva due to hx of glaucoma.  He did not appreciate benefit from LABA/ICS.

## 2012-11-05 ENCOUNTER — Telehealth: Payer: Self-pay | Admitting: Pulmonary Disease

## 2012-11-05 NOTE — Telephone Encounter (Signed)
Spoke with the pt and notified of recs per VS He verbalized understanding and states nothing further needed

## 2012-11-05 NOTE — Telephone Encounter (Signed)
Please inform pt that he does not have high enough flow rate to use face mask, and that he needs to continue using nasal prongs with his oxygen set up at night.

## 2012-11-05 NOTE — Telephone Encounter (Signed)
Spoke with James [name] with [facility] She states that they have received order for face mask for o2, but they can only provide this if pt is on at least 4 liters of o2  She states another option would be an oxymizer  Please advise, thanks!

## 2012-11-05 NOTE — Telephone Encounter (Signed)
James [name] returning call. °

## 2012-11-05 NOTE — Telephone Encounter (Signed)
LMOMTCB x1 for James [name]

## 2012-12-13 DIAGNOSIS — I10 Essential (primary) hypertension: Secondary | ICD-10-CM | POA: Diagnosis not present

## 2012-12-13 DIAGNOSIS — E669 Obesity, unspecified: Secondary | ICD-10-CM | POA: Diagnosis not present

## 2012-12-13 DIAGNOSIS — R7309 Other abnormal glucose: Secondary | ICD-10-CM | POA: Diagnosis not present

## 2012-12-13 DIAGNOSIS — E7800 Pure hypercholesterolemia, unspecified: Secondary | ICD-10-CM | POA: Diagnosis not present

## 2012-12-14 DIAGNOSIS — E7800 Pure hypercholesterolemia, unspecified: Secondary | ICD-10-CM | POA: Diagnosis not present

## 2012-12-14 DIAGNOSIS — I10 Essential (primary) hypertension: Secondary | ICD-10-CM | POA: Diagnosis not present

## 2012-12-14 DIAGNOSIS — Z006 Encounter for examination for normal comparison and control in clinical research program: Secondary | ICD-10-CM | POA: Diagnosis not present

## 2012-12-14 DIAGNOSIS — J449 Chronic obstructive pulmonary disease, unspecified: Secondary | ICD-10-CM | POA: Diagnosis not present

## 2013-02-02 ENCOUNTER — Telehealth (HOSPITAL_COMMUNITY): Payer: Self-pay | Admitting: *Deleted

## 2013-02-02 NOTE — Telephone Encounter (Signed)
Mr. Korber has been contacted by phone x 4.  Last call was placed on 01/26/13 and message left to return our call.  He was checking with insurance.  Call not returned.  Cathie Olden RN

## 2013-03-08 DIAGNOSIS — H539 Unspecified visual disturbance: Secondary | ICD-10-CM | POA: Diagnosis not present

## 2013-03-08 DIAGNOSIS — H5702 Anisocoria: Secondary | ICD-10-CM | POA: Diagnosis not present

## 2013-03-08 DIAGNOSIS — R911 Solitary pulmonary nodule: Secondary | ICD-10-CM | POA: Diagnosis not present

## 2013-03-08 DIAGNOSIS — H4011X0 Primary open-angle glaucoma, stage unspecified: Secondary | ICD-10-CM | POA: Diagnosis not present

## 2013-03-08 DIAGNOSIS — H579 Unspecified disorder of eye and adnexa: Secondary | ICD-10-CM | POA: Diagnosis not present

## 2013-03-08 DIAGNOSIS — R948 Abnormal results of function studies of other organs and systems: Secondary | ICD-10-CM | POA: Diagnosis not present

## 2013-03-08 DIAGNOSIS — H5704 Mydriasis: Secondary | ICD-10-CM | POA: Diagnosis not present

## 2013-03-08 DIAGNOSIS — H409 Unspecified glaucoma: Secondary | ICD-10-CM | POA: Diagnosis not present

## 2013-03-08 DIAGNOSIS — H538 Other visual disturbances: Secondary | ICD-10-CM | POA: Diagnosis not present

## 2013-03-17 ENCOUNTER — Telehealth: Payer: Self-pay | Admitting: Pulmonary Disease

## 2013-03-17 DIAGNOSIS — R911 Solitary pulmonary nodule: Secondary | ICD-10-CM

## 2013-03-17 DIAGNOSIS — J439 Emphysema, unspecified: Secondary | ICD-10-CM

## 2013-03-17 NOTE — Telephone Encounter (Signed)
Spoke with the pt's spouse and notified of recs per VS She verbalized understanding  She states that nothing else has been set up yet  I advised will order CT Chest with CM and that someone will call her to set this up  Will need labs- order placed for BMET  Nothing further needed

## 2013-03-17 NOTE — Telephone Encounter (Signed)
Spoke with pt spouse. They are requesting an ASAP appt with VS. Pt had ct head last week at [facility] (can be pulled in care everywhere) and it showed a nodule. They are wanting to discuss this with VS. Nothing available. Please advise VS thanks

## 2013-03-17 NOTE — Telephone Encounter (Signed)
Report from [facility] reviewed.  He has 7 mm nodule in Lt lung.    Please check whether [facility] schedule him for CT chest to further assess.  If not, then please order CT chest with IV contrast.  Please advise pt I will call him once CT chest is reviewed, and then discuss options.

## 2013-03-18 ENCOUNTER — Other Ambulatory Visit (INDEPENDENT_AMBULATORY_CARE_PROVIDER_SITE_OTHER): Payer: 59

## 2013-03-18 DIAGNOSIS — J438 Other emphysema: Secondary | ICD-10-CM

## 2013-03-18 DIAGNOSIS — J439 Emphysema, unspecified: Secondary | ICD-10-CM

## 2013-03-18 LAB — BASIC METABOLIC PANEL
BUN: 14 mg/dL (ref 6–23)
CHLORIDE: 102 meq/L (ref 96–112)
CO2: 33 mEq/L — ABNORMAL HIGH (ref 19–32)
Calcium: 9.4 mg/dL (ref 8.4–10.5)
Creatinine, Ser: 1.1 mg/dL (ref 0.4–1.5)
GFR: 70.15 mL/min (ref >60.00)
Glucose, Bld: 130 mg/dL — ABNORMAL HIGH (ref 70–99)
POTASSIUM: 3.9 meq/L (ref 3.5–5.1)
SODIUM: 141 meq/L (ref 135–145)

## 2013-03-23 ENCOUNTER — Ambulatory Visit (INDEPENDENT_AMBULATORY_CARE_PROVIDER_SITE_OTHER)
Admission: RE | Admit: 2013-03-23 | Discharge: 2013-03-23 | Disposition: A | Discharge status: Home / Self Care | Payer: 59 | Source: Ambulatory Visit | Attending: Pulmonary Disease | Admitting: Pulmonary Disease

## 2013-03-23 DIAGNOSIS — H409 Unspecified glaucoma: Secondary | ICD-10-CM | POA: Diagnosis not present

## 2013-03-23 DIAGNOSIS — R911 Solitary pulmonary nodule: Secondary | ICD-10-CM

## 2013-03-23 DIAGNOSIS — J438 Other emphysema: Secondary | ICD-10-CM | POA: Diagnosis not present

## 2013-03-23 DIAGNOSIS — H4011X0 Primary open-angle glaucoma, stage unspecified: Secondary | ICD-10-CM | POA: Diagnosis not present

## 2013-03-23 DIAGNOSIS — H2510 Age-related nuclear cataract, unspecified eye: Secondary | ICD-10-CM | POA: Diagnosis not present

## 2013-03-23 IMAGING — CT CT CHEST W/ CM
2 of 4 series · 15 of 36 positions shown, 18 images · IV contrast (Omnipaque 300)
Comparison: CT abdomen [DATE]

CLINICAL DATA: COPD.  Pulmonary nodule.

EXAM:
CT CHEST WITH CONTRAST
TECHNIQUE: Multidetector CT imaging of the chest was performed during
intravenous contrast administration.
CONTRAST:  80mL OMNIPAQUE IOHEXOL 300 MG/ML  SOLN

[Series 2: chest routine with · axial · 0.87mm/px · z∈[-416,-71]mm · 12 of 77 slices shown, 15 images]
[im 4/77  mediastinal]
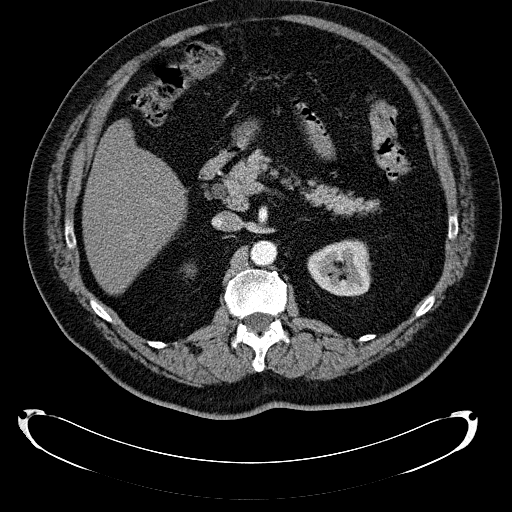
[im 4/77  lung]
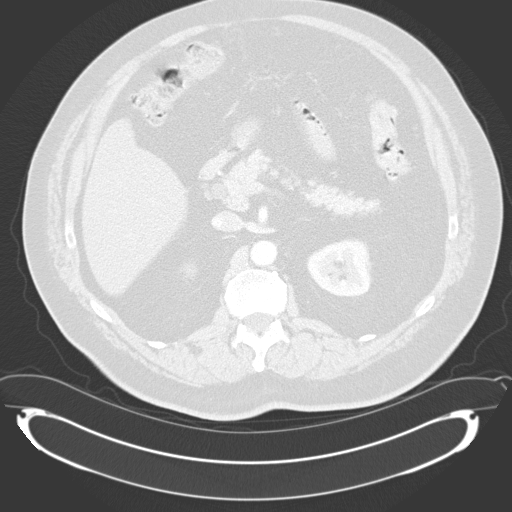
[im 11/77  lung]
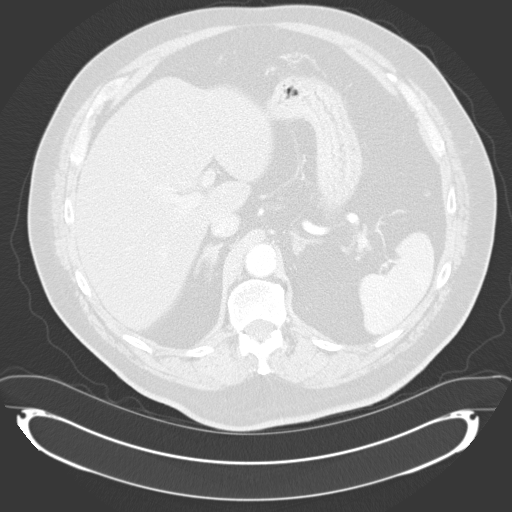
[im 18/77  lung]
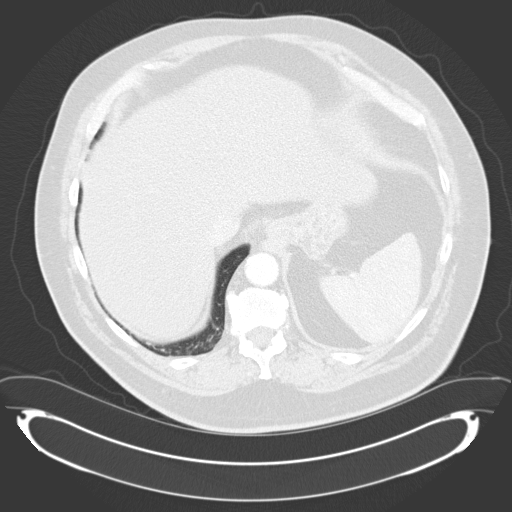
[im 25/77  lung]
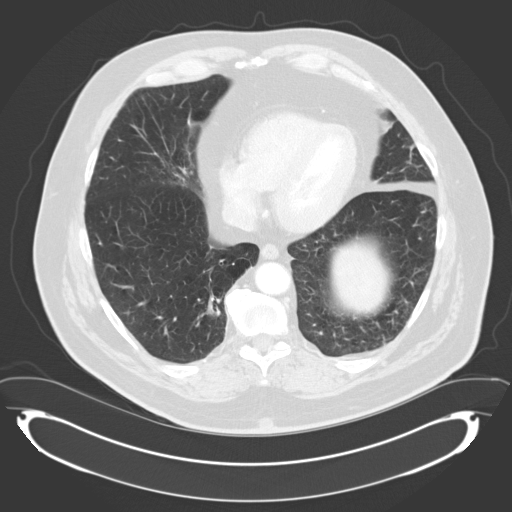
[im 28/77  mediastinal]
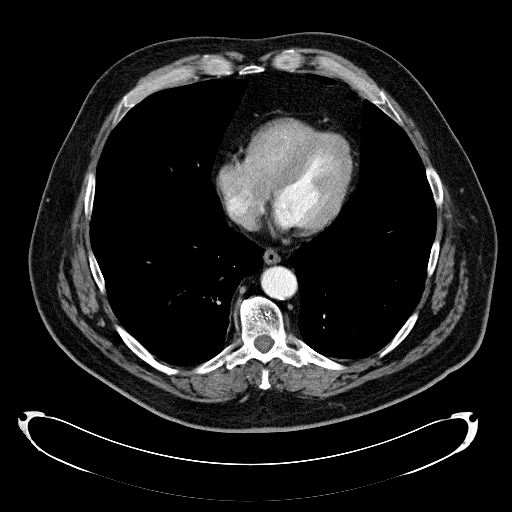
[im 28/77  lung]
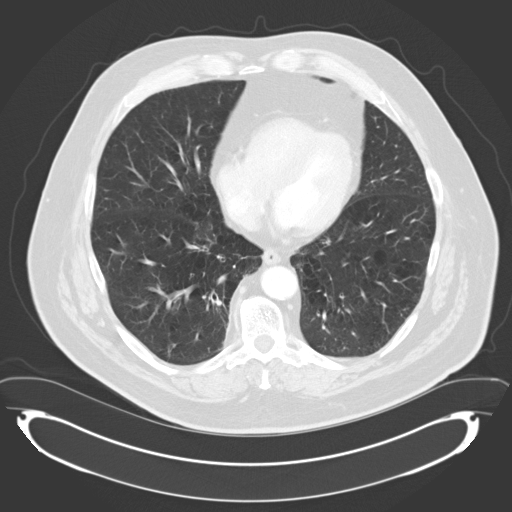
[im 35/77  lung]
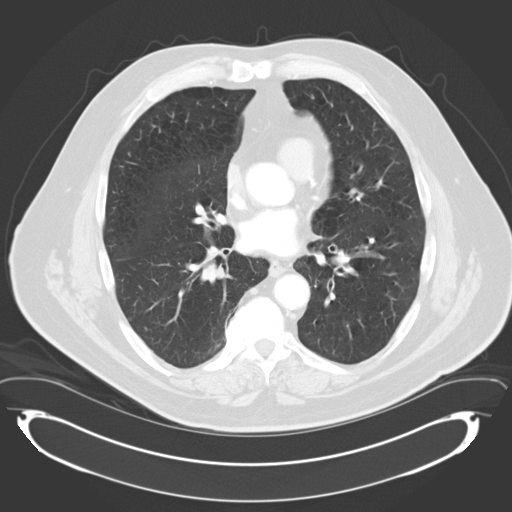
[im 42/77  lung]
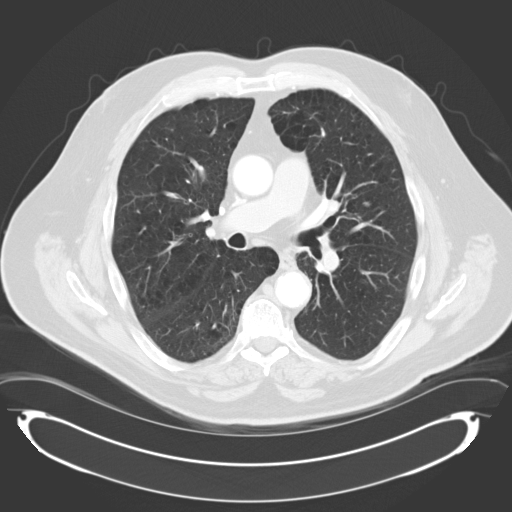
[im 49/77  lung]
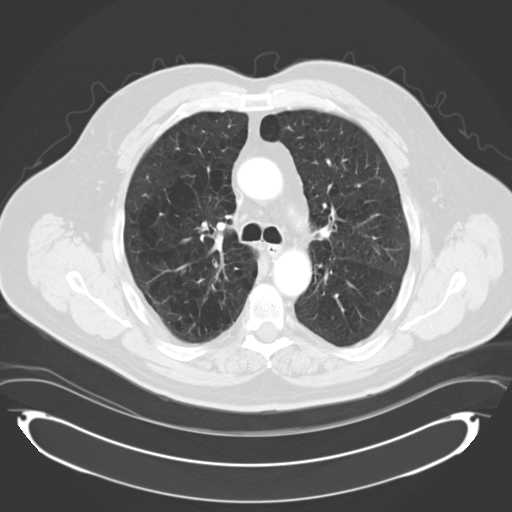
[im 52/77  mediastinal]
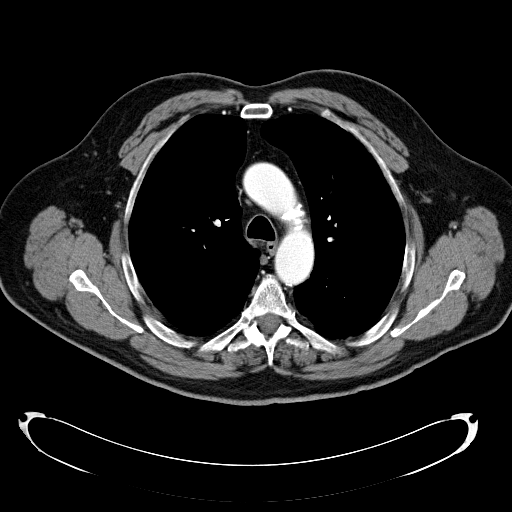
[im 52/77  lung]
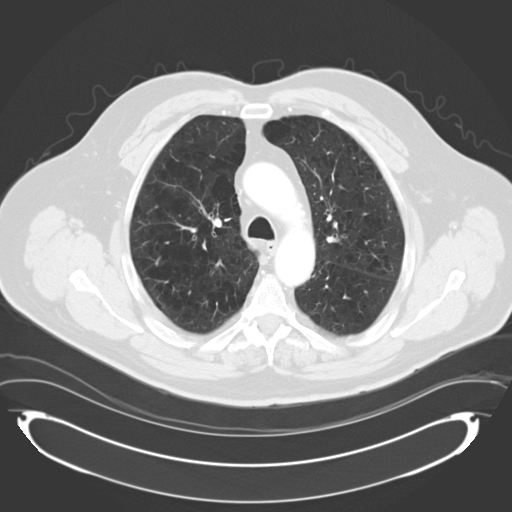
[im 59/77  lung]
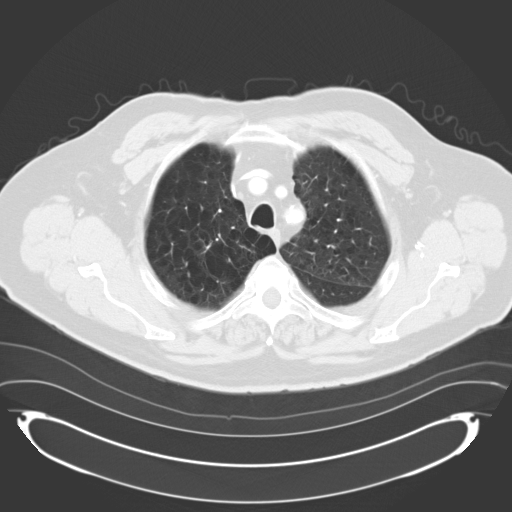
[im 66/77  lung]
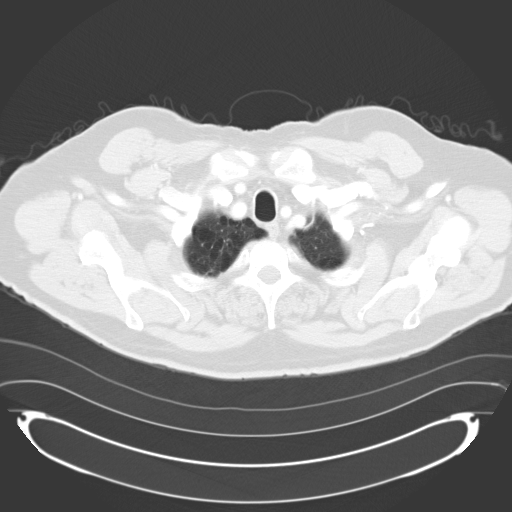
[im 73/77  lung]
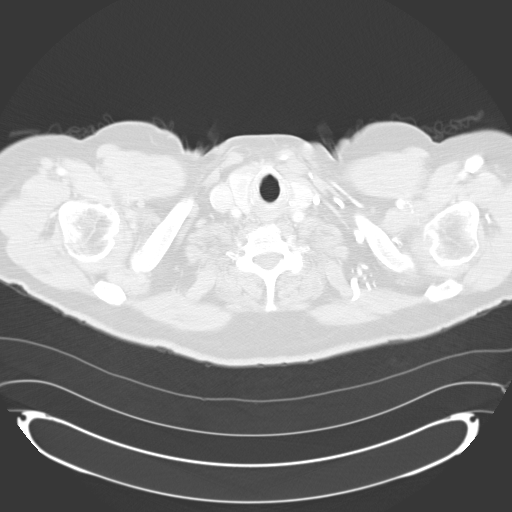

[Series 602: coronals · coronal · 0.87mm/px · 3 of 144 slices shown]
[im 29/144  lung]
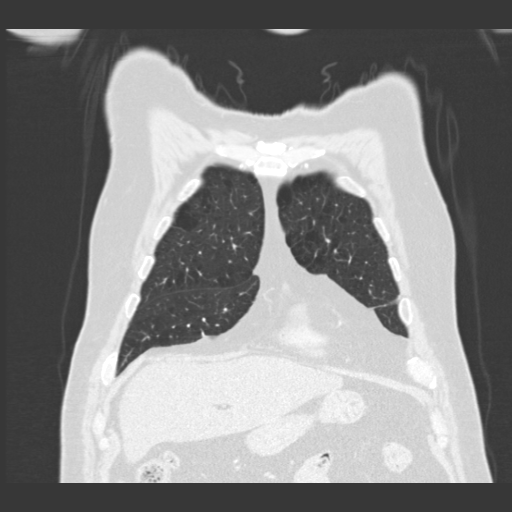
[im 58/144  lung]
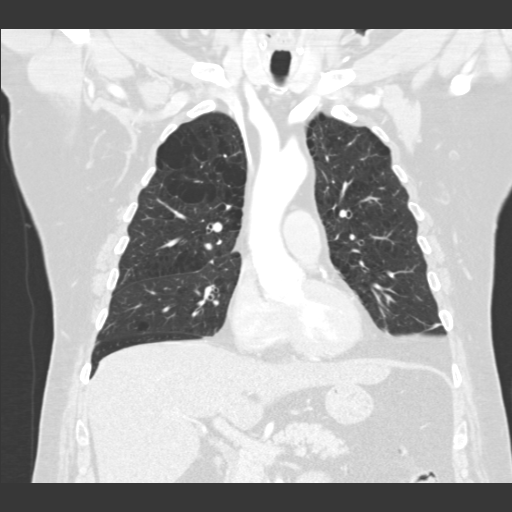
[im 86/144  lung]
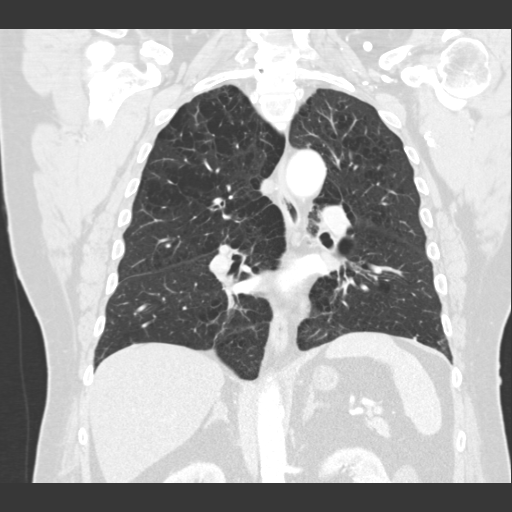

[15 of 36 positions shown; findings below may reference images not displayed]

FINDINGS: Lungs are adequately inflated with mild diffuse centrilobular
emphysematous disease. There is no consolidation or effusion. There
is a focal linear density over the posterior left apex likely
scarring. There is a 3-4 mm nodule over the superior segment of the
right lower lobe. There is a 4 mm nodule over the medial left lower
lobe. There is minimal focal linear density over the posterior right
lower lobe unchanged from [0K].

Heart is normal in size. There is minimal calcified plaque over the
left main and anterior descending coronary arteries. There is
minimal atherosclerotic plaque involving the thoracic aorta. There
is no mediastinal or hilar adenopathy. Remaining mediastinal
structures are unremarkable. There are a few scattered low density
thyroid nodules likely representing a goiter.

Images through the upper abdomen demonstrate an exophytic simple
cystic structure over the upper pole of the left kidney only
partially visualized. Evidence prior cholecystectomy. There is
calcified plaque of the abdominal aorta. Remainder of the exam is
unremarkable.
IMPRESSION: Mild centrilobular emphysematous disease. Minimal focal scarring
over the posterior left apex and posterior right lower lobe. 2 small
nodules measuring 3-4 mm over the right lower lobe and 4 mm over the
left lower lobe likely benign. Recommend follow-up noncontrast chest
CT in 1 year.

This recommendation follows the consensus statement: Guidelines for
Management of Small Pulmonary Nodules Detected on CT Scans: A
Statement from the [HOSPITAL] as published in Radiology
[0K]; [DATE]. Online at:
[URL]

Atherosclerotic disease involving the thoracoabdominal aorta and
coronary arteries as described.

Simple cystic structure over the superior pole of the left kidney
not completely evaluated on this exam likely a simple cyst.

Thyroid goiter.

## 2013-03-23 MED ORDER — IOHEXOL 300 MG/ML  SOLN
80.0000 mL | Freq: Once | INTRAMUSCULAR | Status: AC | PRN
  Administered 2013-03-23: 80 mL via INTRAVENOUS

## 2013-03-24 ENCOUNTER — Telehealth: Payer: Self-pay | Admitting: Pulmonary Disease

## 2013-03-24 NOTE — Telephone Encounter (Signed)
03/23/2013    CLINICAL DATA:  COPD.  Pulmonary nodule.   EXAM: CT CHEST WITH CONTRAST   TECHNIQUE: Multidetector CT imaging of the chest was performed during intravenous contrast administration.   CONTRAST:  55mL OMNIPAQUE IOHEXOL 300 MG/ML  SOLN   COMPARISON:  CT abdomen 02/05/2007   FINDINGS:  Lungs are adequately inflated with mild diffuse centrilobular emphysematous disease. There is no consolidation or effusion. There is a focal linear density over the posterior left apex likely scarring. There is a 3-4 mm nodule over the superior segment of the right lower lobe. There is a 4 mm nodule over the medial left lower lobe. There is minimal focal linear density over the posterior right lower lobe unchanged from 2008.  Heart is normal in size. There is minimal calcified plaque over the left main and anterior descending coronary arteries. There is minimal atherosclerotic plaque involving the thoracic aorta. There is no mediastinal or hilar adenopathy. Remaining mediastinal structures are unremarkable. There are a few scattered low density thyroid nodules likely representing a goiter.  Images through the upper abdomen demonstrate an exophytic simple cystic structure over the upper pole of the left kidney only partially visualized. Evidence prior cholecystectomy. There is calcified plaque of the abdominal aorta. Remainder of the exam is unremarkable.   IMPRESSION:  1) Mild centrilobular emphysematous disease.  2) Minimal focal scarring over the posterior left apex and posterior right lower lobe. 2 small nodules measuring 3-4 mm over the right lower lobe and 4 mm over the left lower lobe likely benign. Recommend follow-up noncontrast chest CT in 1 year.   This recommendation follows the consensus statement: Guidelines for Management of Small Pulmonary Nodules Detected on CT Scans: A Statement from the [place] as published in Radiology 2005; 237:395-400. Online at:  https://www.arnold.com/.   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   3) Simple cystic structure over the superior pole of the left kidney not completely evaluated on this exam likely a simple cyst.   4) Thyroid goiter.    Electronically Signed   By: Marin Olp M.D.   On: 03/23/2013 15:57   Attempted to call pt to discuss results.  Will have my nurse inform pt that CT chest shows expected changes from emphysema, and 2 small nodules (3 to 4 mm) in rt lower lung, and 1 small nodule (4 mm) in lt lower lung.  These are likely benign, but he will need a follow up CT chest in January 2016 to monitor.  He can scheduled next available ROV to discuss results in more detail.

## 2013-03-25 NOTE — Telephone Encounter (Signed)
atc busy signal x1 wcb

## 2013-03-25 NOTE — Telephone Encounter (Signed)
Pt is aware of CT results. ROV has been scheduled for 04/12/13 at 10:15am. Nothing further was needed.

## 2013-04-12 ENCOUNTER — Ambulatory Visit: Payer: 59 | Admitting: Pulmonary Disease

## 2013-04-14 ENCOUNTER — Ambulatory Visit (INDEPENDENT_AMBULATORY_CARE_PROVIDER_SITE_OTHER): Payer: 59 | Admitting: Pulmonary Disease

## 2013-04-14 ENCOUNTER — Encounter: Payer: Self-pay | Admitting: Pulmonary Disease

## 2013-04-14 VITALS — BP 150/80 | HR 78 | Ht 73.0 in | Wt 260.0 lb

## 2013-04-14 DIAGNOSIS — R059 Cough, unspecified: Secondary | ICD-10-CM

## 2013-04-14 DIAGNOSIS — C3432 Malignant neoplasm of lower lobe, left bronchus or lung: Secondary | ICD-10-CM | POA: Insufficient documentation

## 2013-04-14 DIAGNOSIS — R058 Other specified cough: Secondary | ICD-10-CM | POA: Insufficient documentation

## 2013-04-14 DIAGNOSIS — J438 Other emphysema: Secondary | ICD-10-CM | POA: Diagnosis not present

## 2013-04-14 DIAGNOSIS — R05 Cough: Secondary | ICD-10-CM | POA: Diagnosis not present

## 2013-04-14 DIAGNOSIS — R0902 Hypoxemia: Secondary | ICD-10-CM | POA: Diagnosis not present

## 2013-04-14 DIAGNOSIS — R918 Other nonspecific abnormal finding of lung field: Secondary | ICD-10-CM | POA: Insufficient documentation

## 2013-04-14 DIAGNOSIS — J439 Emphysema, unspecified: Secondary | ICD-10-CM

## 2013-04-14 MED ORDER — ALBUTEROL SULFATE HFA 108 (90 BASE) MCG/ACT IN AERS: 2.0000 | INHALATION_SPRAY | Freq: Four times a day (QID) | RESPIRATORY_TRACT | Status: DC | PRN

## 2013-04-14 NOTE — Assessment & Plan Note (Signed)
Discussed when he should use albuterol.  He is not able to use spiriva due to hx of glaucoma.  He did not appreciate benefit from LABA/ICS.

## 2013-04-14 NOTE — Assessment & Plan Note (Signed)
Discussed importance of using oxygen with activity.  Will arrange for portable pulse oximeter.  Will arrange for face mask to use for oxygen at night.  Explained that some of his night time breathing difficulties could be related to sleep apnea >> he will monitor his sleep pattern, and then decide if he wants repeat sleep testing.

## 2013-04-14 NOTE — Progress Notes (Signed)
Chief Complaint  Patient presents with  . COPD    Breathing is unchanged. Reports DOE. Had CT done of his chest. Here to review this.    History of Present Illness: James [name] is a 68 y.o. male former smoker with COPD/emphysema, hypoxemia needing 2 liters oxygen with exertion and sleep, and pulmonary nodule.  His breathing is doing okay.  He gets globus sensation in the morning.  He has trouble with sinus congestion.  He denies reflux.  He is not having cough or wheeze.  He gets winded with activity, but recovers quickly.  He uses his oxygen at night and with exercise.  He had CT chest in January.  TESTS: PSG 01/25/08 >> RDI 6.8, SpO2 low 84%.  Spent 79% of test time with SpO2 < 90% Spirometry 06/08/12 >> FEV1 1.91 (49%), FEV1% 59 CXR 06/08/12 >> changes of emphysema ONO with RA 06/16/12 >> Test time 9 hrs 21 min.  Mean SpO2 89%, low SpO2 63%.  Spent 2 hrs 25 min with SpO2 < 88% 07/07/12 >> SpO2 86% on room air with exertion >> start 2 liters oxygen with exertion and sleep A1AT 07/07/12 >> 154, PI-MM CT chest 03/23/13 >> mild/diffuse centrilobular emphysema, 4 mm nodule RLL superior segment, 4 mm nodule LLL  [name]  has a past medical history of Allergy; Blood transfusion; COPD (chronic obstructive pulmonary disease); Depression; Hyperlipidemia; Blind left eye; Tubular adenoma of colon (03/2011); Diverticulosis; Internal and external bleeding hemorrhoids; Osteoarthritis; Hypertension; Insomnia; Iron deficiency anemia; Rosacea; ED (erectile dysfunction); and Glaucoma.  [name]  has past surgical history that includes Partial colectomy (2008); Clavicle surgery ([place]); Knee arthroscopy (1996); Cholecystectomy; Tonsillectomy; and Thyroid cyst excision.  Prior to Admission medications   Medication Sig Start Date End Date Taking? Authorizing Provider  albuterol (VENTOLIN HFA) 108 (90 BASE) MCG/ACT inhaler  05/13/12   Historical Provider, MD  atorvastatin (LIPITOR) 20  MG tablet  05/29/12   Historical Provider, MD  brimonidine (ALPHAGAN) 0.2 % ophthalmic solution  06/22/12   Historical Provider, MD  budesonide-formoterol (SYMBICORT) 160-4.5 MCG/ACT inhaler Inhale 2 puffs into the lungs 2 (two) times daily. 06/09/12   Chesley Mires, MD  buPROPion (WELLBUTRIN XL) 300 MG 24 hr tablet  05/31/12   Historical Provider, MD  calcium gluconate 500 MG tablet Take 500 mg by mouth 2 (two) times daily.    Historical Provider, MD  calcium-vitamin D (OSCAL WITH D) 250-125 MG-UNIT per tablet Take 1 tablet by mouth daily.    Historical Provider, MD  cholecalciferol (VITAMIN D) 400 UNITS TABS Take by mouth.    Historical Provider, MD  CINNAMON PO Take 1 capsule by mouth daily.      Historical Provider, MD  dorzolamide-timolol (COSOPT) 22.3-6.8 MG/ML ophthalmic solution  06/22/12   Historical Provider, MD  ferrous sulfate 325 (65 FE) MG tablet Take 325 mg by mouth daily with breakfast.    Historical Provider, MD  fluconazole (DIFLUCAN) 100 MG tablet Take 1 tablet (100 mg total) by mouth daily. 06/30/12   [name], MD  Nyoka Cowden Tea 150 MG CAPS Take by mouth daily. 04/02/07   Historical Provider, MD  hydrochlorothiazide (HYDRODIURIL) 25 MG tablet  06/06/12   Historical Provider, MD  metroNIDAZOLE (METROCREAM) 0.75 % cream Apply topically as needed.    Historical Provider, MD  Multiple Vitamins-Minerals (MULTIVITAMIN WITH MINERALS) tablet Take 1 tablet by mouth daily.      Historical Provider, MD  OMEGA-3 1000 MG CAPS Take 1 g  by mouth daily. 04/02/07   Historical Provider, MD  sertraline (ZOLOFT) 100 MG tablet  06/07/12   Historical Provider, MD  vardenafil (LEVITRA) 20 MG tablet Take 20 mg by mouth daily.    Historical Provider, MD    Allergies  Allergen Reactions  . Codeine Itching  . Morphine Itching     Physical Exam:  General - No distress ENT - No sinus tenderness, nasal septal deviation, MP 3, no oral exudate, no LAN Cardiac - s1s2 regular, no murmur Chest - Decrease breath  sounds, no wheeze, prolonged exhalation Back - No focal tenderness Abd - Soft, non-tender Ext - No edema Neuro - Normal strength Skin - No rashes Psych - normal mood, and behavior  Ct Chest W Contrast  03/23/2013   CLINICAL DATA:  COPD.  Pulmonary nodule.  EXAM: CT CHEST WITH CONTRAST  TECHNIQUE: Multidetector CT imaging of the chest was performed during intravenous contrast administration.  CONTRAST:  38mL OMNIPAQUE IOHEXOL 300 MG/ML  SOLN  COMPARISON:  CT abdomen 02/05/2007  FINDINGS: Lungs are adequately inflated with mild diffuse centrilobular emphysematous disease. There is no consolidation or effusion. There is a focal linear density over the posterior left apex likely scarring. There is a 3-4 mm nodule over the superior segment of the right lower lobe. There is a 4 mm nodule over the medial left lower lobe. There is minimal focal linear density over the posterior right lower lobe unchanged from 2008.  Heart is normal in size. There is minimal calcified plaque over the left main and anterior descending coronary arteries. There is minimal atherosclerotic plaque involving the thoracic aorta. There is no mediastinal or hilar adenopathy. Remaining mediastinal structures are unremarkable. There are a few scattered low density thyroid nodules likely representing a goiter.  Images through the upper abdomen demonstrate an exophytic simple cystic structure over the upper pole of the left kidney only partially visualized. Evidence prior cholecystectomy. There is calcified plaque of the abdominal aorta. Remainder of the exam is unremarkable.  IMPRESSION: Mild centrilobular emphysematous disease. Minimal focal scarring over the posterior left apex and posterior right lower lobe. 2 small nodules measuring 3-4 mm over the right lower lobe and 4 mm over the left lower lobe likely benign. Recommend follow-up noncontrast chest CT in 1 year.  This recommendation follows the consensus statement: Guidelines for  Management of Small Pulmonary Nodules Detected on CT Scans: A Statement from the [place] as published in Radiology 2005; 237:395-400. Online at: https://www.arnold.com/.  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  Simple cystic structure over the superior pole of the left kidney not completely evaluated on this exam likely a simple cyst.  Thyroid goiter.   Electronically Signed   By: Marin Olp M.D.   On: 03/23/2013 15:57    Assessment/Plan:  Chesley Mires, MD [facility]/Critical Care/Sleep Pager:  [phone]

## 2013-04-14 NOTE — Addendum Note (Signed)
Addended by: Chesley Mires on: 04/14/2013 10:11 AM   Modules accepted: Orders

## 2013-04-14 NOTE — Assessment & Plan Note (Signed)
He will need f/u CT chest w/o contrast for January 2016.

## 2013-04-14 NOTE — Assessment & Plan Note (Signed)
Related to post-nasal drip.  Advised him to try nasal irrigation, and he can try OTC nasacort prn.

## 2013-04-14 NOTE — Patient Instructions (Signed)
Try using nasal irrigation (saline nasal spray) as needed for sinus congestion Can try using nasacort two sprays each nostril once daily as needed for sinus congestion >> you can get this over the counter without a prescription Will arrange for portable pulse oximeter Use ventolin two puffs up to four times per day as needed for cough, wheezing, or chest congestion Follow up in 6 months

## 2013-05-31 DIAGNOSIS — I10 Essential (primary) hypertension: Secondary | ICD-10-CM | POA: Diagnosis not present

## 2013-05-31 DIAGNOSIS — Z125 Encounter for screening for malignant neoplasm of prostate: Secondary | ICD-10-CM | POA: Diagnosis not present

## 2013-05-31 DIAGNOSIS — N400 Enlarged prostate without lower urinary tract symptoms: Secondary | ICD-10-CM | POA: Diagnosis not present

## 2013-05-31 DIAGNOSIS — E7800 Pure hypercholesterolemia, unspecified: Secondary | ICD-10-CM | POA: Diagnosis not present

## 2013-05-31 DIAGNOSIS — Z0000 Encounter for general adult medical examination without abnormal findings: Secondary | ICD-10-CM | POA: Diagnosis not present

## 2013-05-31 DIAGNOSIS — K430 Incisional hernia with obstruction, without gangrene: Secondary | ICD-10-CM | POA: Diagnosis not present

## 2013-05-31 DIAGNOSIS — L570 Actinic keratosis: Secondary | ICD-10-CM | POA: Diagnosis not present

## 2013-06-13 DIAGNOSIS — N529 Male erectile dysfunction, unspecified: Secondary | ICD-10-CM | POA: Diagnosis not present

## 2013-06-15 DIAGNOSIS — R7309 Other abnormal glucose: Secondary | ICD-10-CM | POA: Diagnosis not present

## 2013-06-21 ENCOUNTER — Institutional Professional Consult (permissible substitution): Payer: 59 | Admitting: Pulmonary Disease

## 2013-06-21 DIAGNOSIS — E119 Type 2 diabetes mellitus without complications: Secondary | ICD-10-CM | POA: Diagnosis not present

## 2013-06-21 DIAGNOSIS — I10 Essential (primary) hypertension: Secondary | ICD-10-CM | POA: Diagnosis not present

## 2013-06-21 DIAGNOSIS — E7800 Pure hypercholesterolemia, unspecified: Secondary | ICD-10-CM | POA: Diagnosis not present

## 2013-06-23 ENCOUNTER — Institutional Professional Consult (permissible substitution): Payer: 59 | Admitting: Pulmonary Disease

## 2013-07-25 DIAGNOSIS — H4011X0 Primary open-angle glaucoma, stage unspecified: Secondary | ICD-10-CM | POA: Diagnosis not present

## 2013-07-25 DIAGNOSIS — H409 Unspecified glaucoma: Secondary | ICD-10-CM | POA: Diagnosis not present

## 2013-07-25 DIAGNOSIS — H2510 Age-related nuclear cataract, unspecified eye: Secondary | ICD-10-CM | POA: Diagnosis not present

## 2013-08-09 DIAGNOSIS — M545 Low back pain, unspecified: Secondary | ICD-10-CM | POA: Diagnosis not present

## 2013-09-22 DIAGNOSIS — M545 Low back pain, unspecified: Secondary | ICD-10-CM | POA: Diagnosis not present

## 2013-09-22 DIAGNOSIS — IMO0002 Reserved for concepts with insufficient information to code with codable children: Secondary | ICD-10-CM | POA: Diagnosis not present

## 2013-09-22 DIAGNOSIS — S93499A Sprain of other ligament of unspecified ankle, initial encounter: Secondary | ICD-10-CM | POA: Diagnosis not present

## 2013-09-28 ENCOUNTER — Telehealth: Payer: Self-pay | Admitting: Pulmonary Disease

## 2013-09-28 NOTE — Telephone Encounter (Signed)
Received letter from [facility] stating documentation is needed for pt to continue home oxygen supplies.  He needs physician evaluation after 05/08/13.  He cancelled appointments in April 2015.  Will have my nurse schedule next available ROV.

## 2013-09-29 NOTE — Telephone Encounter (Signed)
lmtcb x1 

## 2013-10-03 DIAGNOSIS — M47817 Spondylosis without myelopathy or radiculopathy, lumbosacral region: Secondary | ICD-10-CM | POA: Diagnosis not present

## 2013-10-03 DIAGNOSIS — R7309 Other abnormal glucose: Secondary | ICD-10-CM | POA: Diagnosis not present

## 2013-10-04 DIAGNOSIS — E669 Obesity, unspecified: Secondary | ICD-10-CM | POA: Diagnosis not present

## 2013-10-04 DIAGNOSIS — E119 Type 2 diabetes mellitus without complications: Secondary | ICD-10-CM | POA: Diagnosis not present

## 2013-10-04 NOTE — Telephone Encounter (Signed)
lmtcb x2 

## 2013-10-06 DIAGNOSIS — M545 Low back pain, unspecified: Secondary | ICD-10-CM | POA: Diagnosis not present

## 2013-10-07 NOTE — Telephone Encounter (Signed)
Pt is scheduled for 11/07/13 w/ VS. Nothing further needed

## 2013-11-02 DIAGNOSIS — J019 Acute sinusitis, unspecified: Secondary | ICD-10-CM | POA: Diagnosis not present

## 2013-11-07 ENCOUNTER — Encounter: Payer: Self-pay | Admitting: Pulmonary Disease

## 2013-11-07 ENCOUNTER — Encounter (INDEPENDENT_AMBULATORY_CARE_PROVIDER_SITE_OTHER): Payer: Self-pay

## 2013-11-07 ENCOUNTER — Ambulatory Visit (INDEPENDENT_AMBULATORY_CARE_PROVIDER_SITE_OTHER): Payer: 59 | Admitting: Pulmonary Disease

## 2013-11-07 VITALS — BP 158/90 | HR 78 | Ht 73.0 in | Wt 254.0 lb

## 2013-11-07 DIAGNOSIS — R05 Cough: Secondary | ICD-10-CM | POA: Diagnosis not present

## 2013-11-07 DIAGNOSIS — R918 Other nonspecific abnormal finding of lung field: Secondary | ICD-10-CM

## 2013-11-07 DIAGNOSIS — R059 Cough, unspecified: Secondary | ICD-10-CM

## 2013-11-07 DIAGNOSIS — J438 Other emphysema: Secondary | ICD-10-CM

## 2013-11-07 DIAGNOSIS — J439 Emphysema, unspecified: Secondary | ICD-10-CM

## 2013-11-07 DIAGNOSIS — R0902 Hypoxemia: Secondary | ICD-10-CM | POA: Diagnosis not present

## 2013-11-07 DIAGNOSIS — R058 Other specified cough: Secondary | ICD-10-CM

## 2013-11-07 NOTE — Assessment & Plan Note (Signed)
He will need f/u CT chest w/o contrast for January 2016.

## 2013-11-07 NOTE — Assessment & Plan Note (Signed)
He is to continue albuterol.  He is not able to use spiriva due to hx of glaucoma.  He did not appreciate benefit from LABA/ICS.  He will get his flu shot today.

## 2013-11-07 NOTE — Progress Notes (Signed)
Chief Complaint  Patient presents with  . Follow-up    Pt states his breathing is unchanged since last OV. Pt stated wearing nocturnal O2 is helping. Pt c/o DOE. Pt denies cough and CP/tightness.     History of Present Illness: Nicolas Banh is a 68 y.o. male former smoker with COPD/emphysema, hypoxemia needing 2 liters oxygen with exertion and sleep, and pulmonary nodule.  His breathing has been okay.  He is using oxygen at night and this helps.  He is not having cough, wheeze, sputum, or chest pain.  He does not need to use albuterol much.   TESTS: PSG 01/25/08 >> RDI 6.8, SpO2 low 84%.  Spent 79% of test time with SpO2 < 90% Spirometry 06/08/12 >> FEV1 1.91 (49%), FEV1% 59 CXR 06/08/12 >> changes of emphysema ONO with RA 06/16/12 >> Test time 9 hrs 21 min.  Mean SpO2 89%, low SpO2 63%.  Spent 2 hrs 25 min with SpO2 < 88% 07/07/12 >> SpO2 86% on room air with exertion >> start 2 liters oxygen with exertion and sleep A1AT 07/07/12 >> 154, PI-MM CT chest 03/23/13 >> mild/diffuse centrilobular emphysema, 4 mm nodule RLL superior segment, 4 mm nodule LLL  PMHx, PSHx, Medications, Allergies, Fhx, Shx reviewed.  Physical Exam:  General - No distress ENT - No sinus tenderness, nasal septal deviation, MP 3, no oral exudate, no LAN Cardiac - s1s2 regular, no murmur Chest - Decrease breath sounds, no wheeze, prolonged exhalation Back - No focal tenderness Abd - Soft, non-tender Ext - No edema Neuro - Normal strength Skin - No rashes Psych - normal mood, and behavior  Assessment/Plan:  Chesley Mires, MD [place]/Critical Care/Sleep Pager:  [phone]

## 2013-11-07 NOTE — Assessment & Plan Note (Signed)
Improved.  Continue prn nasacort and nasal irrigation.

## 2013-11-07 NOTE — Assessment & Plan Note (Signed)
Related to COPD/emphysema.  He needs to continue supplemental oxygen at night.

## 2013-11-07 NOTE — Patient Instructions (Signed)
Flu shot today Follow up in 6 months 

## 2013-11-10 DIAGNOSIS — N1330 Unspecified hydronephrosis: Secondary | ICD-10-CM | POA: Diagnosis not present

## 2013-11-10 DIAGNOSIS — R3129 Other microscopic hematuria: Secondary | ICD-10-CM | POA: Diagnosis not present

## 2013-11-10 DIAGNOSIS — N201 Calculus of ureter: Secondary | ICD-10-CM | POA: Diagnosis not present

## 2013-11-10 DIAGNOSIS — R1032 Left lower quadrant pain: Secondary | ICD-10-CM | POA: Diagnosis not present

## 2013-11-23 DIAGNOSIS — N201 Calculus of ureter: Secondary | ICD-10-CM | POA: Diagnosis not present

## 2013-11-23 DIAGNOSIS — N1330 Unspecified hydronephrosis: Secondary | ICD-10-CM | POA: Diagnosis not present

## 2013-11-23 DIAGNOSIS — R3129 Other microscopic hematuria: Secondary | ICD-10-CM | POA: Diagnosis not present

## 2013-11-23 DIAGNOSIS — N529 Male erectile dysfunction, unspecified: Secondary | ICD-10-CM | POA: Diagnosis not present

## 2013-12-14 ENCOUNTER — Telehealth: Payer: Self-pay | Admitting: Pulmonary Disease

## 2013-12-14 DIAGNOSIS — N201 Calculus of ureter: Secondary | ICD-10-CM | POA: Diagnosis not present

## 2013-12-14 NOTE — Telephone Encounter (Signed)
Pt stopped by office with letter from [place]. Letter states that coverage for O2 will not be submitted to insurance until proper documentation/rx received.  Spoke with [place] from [place], [place] notes received a few days after 11/07/13 OV all that is needed is CMN which has been faxed for Dr Halford Chessman to sign. I have requested that she refax this as we have not received it. Will hold in my box until received. Needs to be given to Alida once received.

## 2013-12-23 NOTE — Telephone Encounter (Signed)
Alida have you seen this CMN?

## 2013-12-23 NOTE — Telephone Encounter (Signed)
As of right now, I Do Not have a cmn on this patient from [place] .James [name]

## 2013-12-23 NOTE — Telephone Encounter (Signed)
Spoke with Hel[place] @ [facility], this must be a old message, [facility] has the signed cmn and nothing is needed. James [name]

## 2014-01-04 DIAGNOSIS — E6609 Other obesity due to excess calories: Secondary | ICD-10-CM | POA: Diagnosis not present

## 2014-01-04 DIAGNOSIS — L918 Other hypertrophic disorders of the skin: Secondary | ICD-10-CM | POA: Diagnosis not present

## 2014-02-03 ENCOUNTER — Telehealth: Payer: Self-pay | Admitting: Pulmonary Disease

## 2014-02-03 NOTE — Telephone Encounter (Signed)
lmtcb X1 for stacy

## 2014-02-03 NOTE — Telephone Encounter (Signed)
I spoke with stacy and she states the pt is wanting to have repeat CT the end of December instead of in Jan due to insurance. The order is for a repeat CT in 1 year, she wanted to know if it was ok to reschedule to end of December. Verbal ok given.   Bing, CMA

## 2014-02-20 ENCOUNTER — Ambulatory Visit (INDEPENDENT_AMBULATORY_CARE_PROVIDER_SITE_OTHER)
Admission: RE | Admit: 2014-02-20 | Discharge: 2014-02-20 | Disposition: A | Discharge status: Home / Self Care | Payer: 59 | Source: Ambulatory Visit | Attending: Pulmonary Disease | Admitting: Pulmonary Disease

## 2014-02-20 DIAGNOSIS — R918 Other nonspecific abnormal finding of lung field: Secondary | ICD-10-CM

## 2014-02-20 IMAGING — CT CT CHEST W/O CM
2 of 4 series · 15 of 36 positions shown, 18 images · IV contrast (Omnipaque 300)
Comparison: [DATE]

CLINICAL DATA: Followup pulmonary nodule

EXAM:
CT CHEST WITHOUT CONTRAST
TECHNIQUE: Multidetector CT imaging of the chest was performed following the
standard protocol without IV contrast..

[Series 2: chest routine with · axial · 0.86mm/px · z∈[-316,-36]mm · 12 of 68 slices shown, 15 images]
[im 6/68  mediastinal]
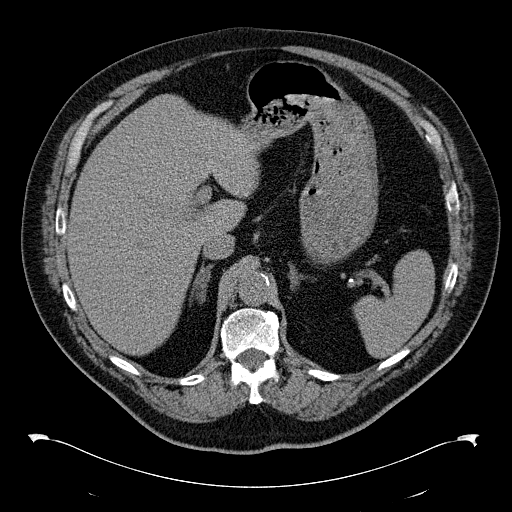
[im 6/68  lung]
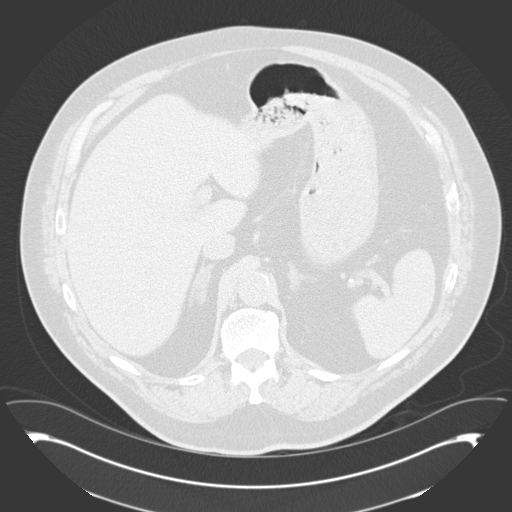
[im 11/68  lung]
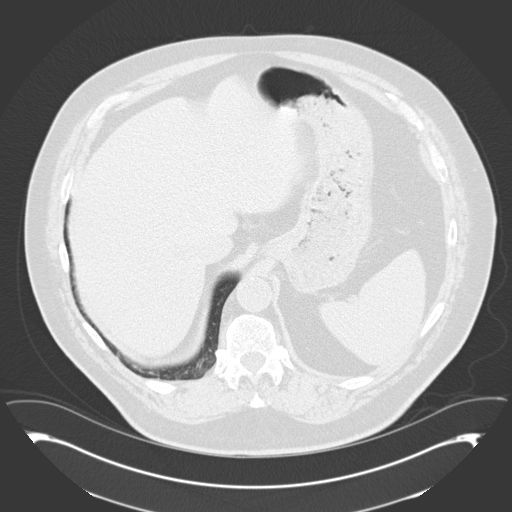
[im 16/68  lung]
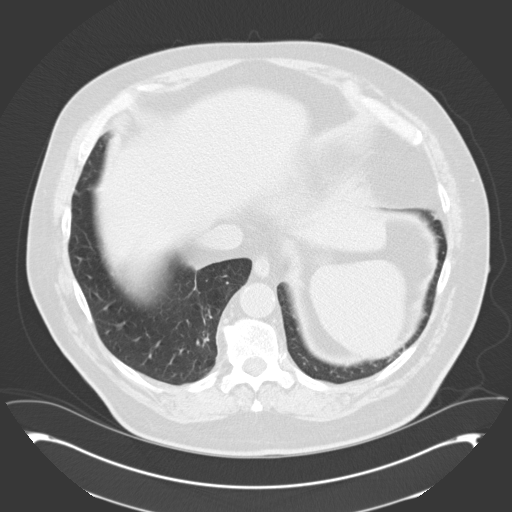
[im 21/68  lung]
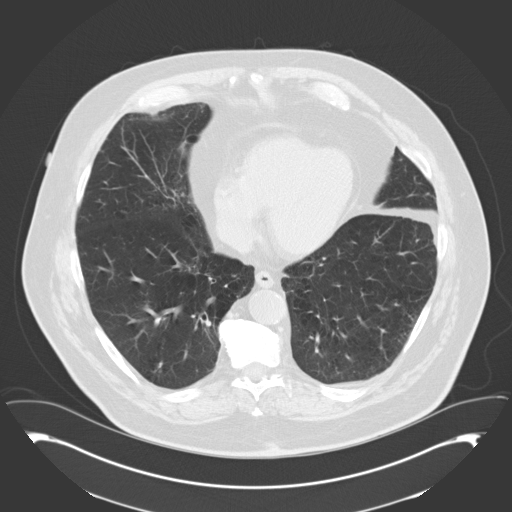
[im 26/68  mediastinal]
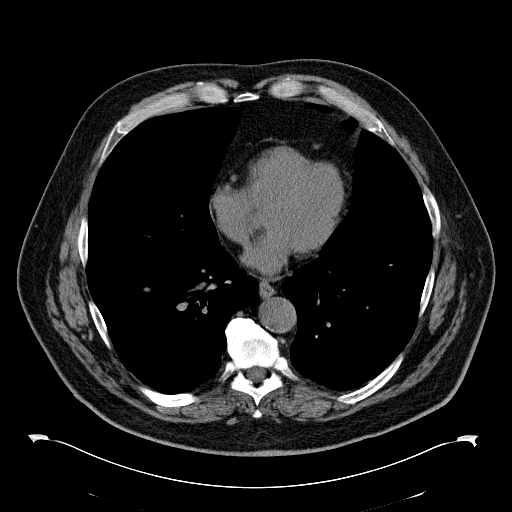
[im 26/68  lung]
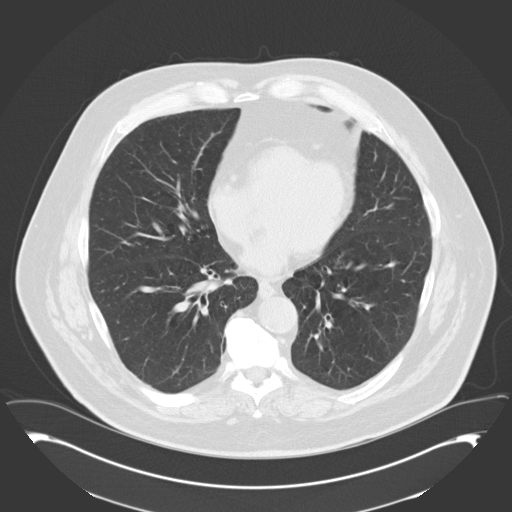
[im 31/68  lung]
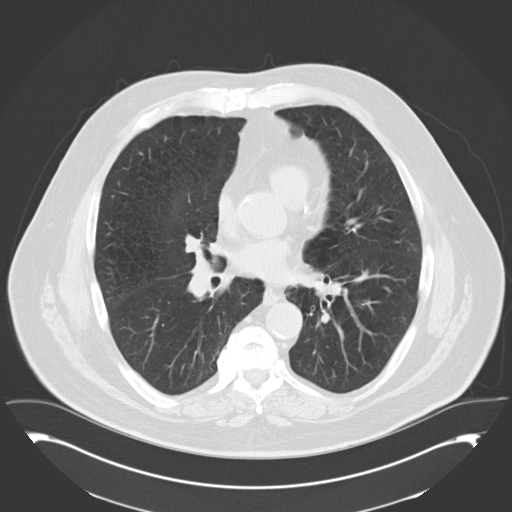
[im 37/68  lung]
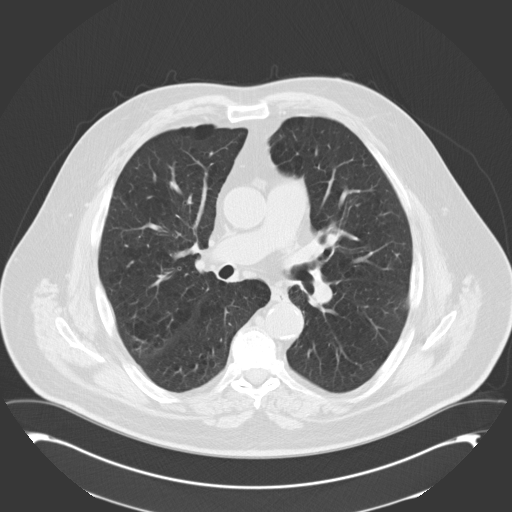
[im 42/68  lung]
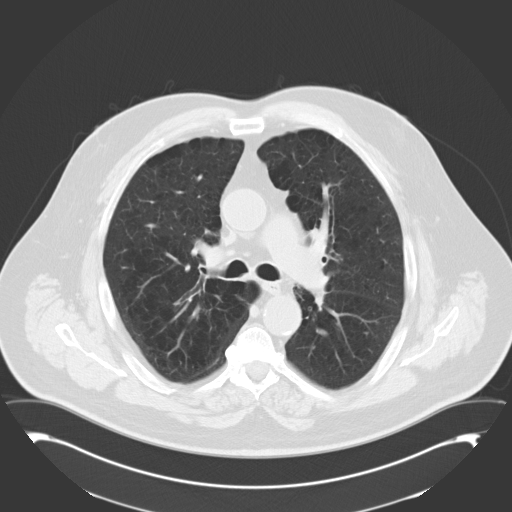
[im 47/68  mediastinal]
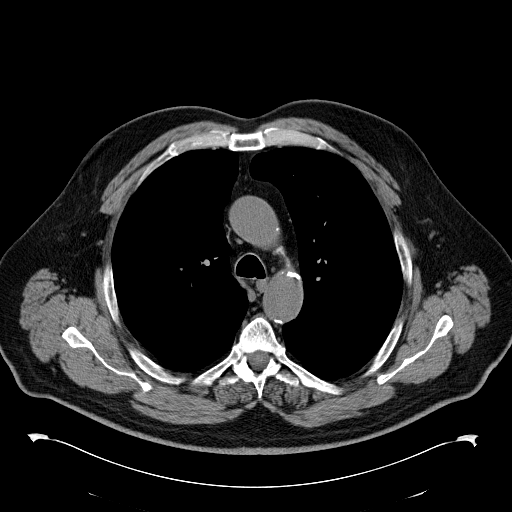
[im 47/68  lung]
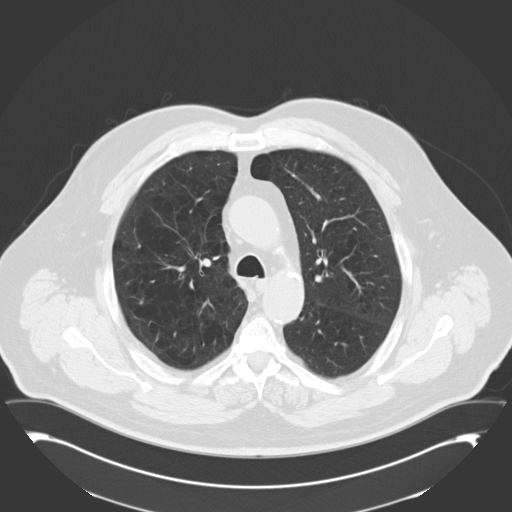
[im 52/68  lung]
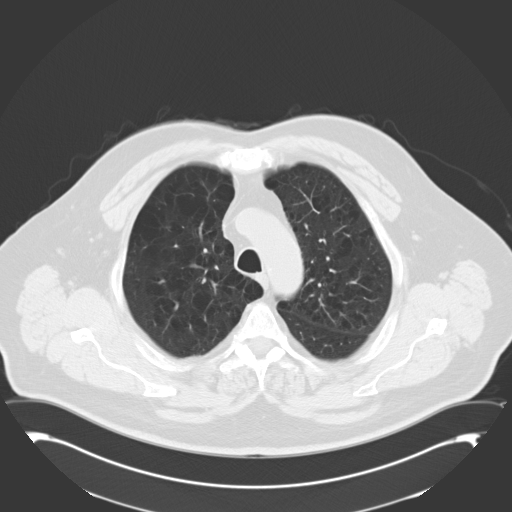
[im 57/68  lung]
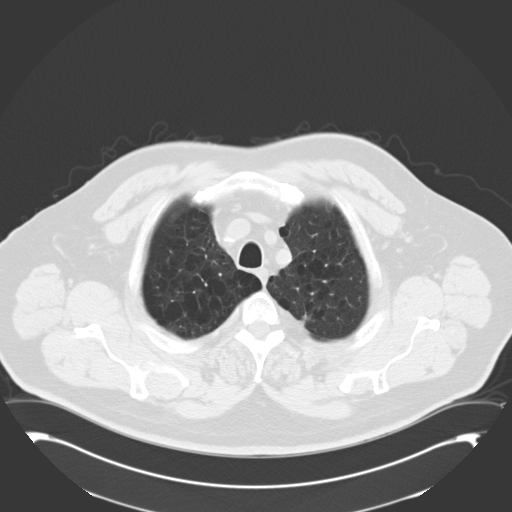
[im 62/68  lung]
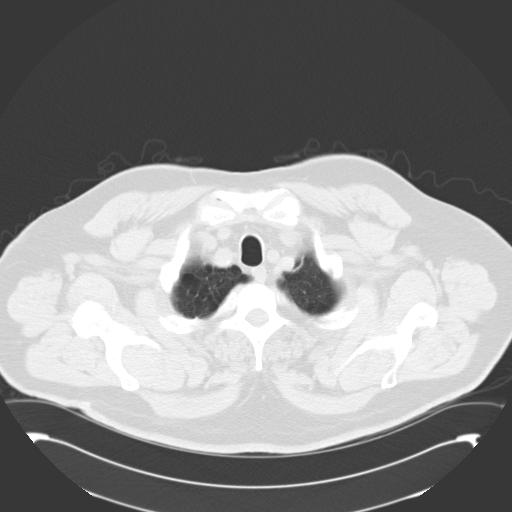

[Series 602: cor · coronal · 0.86mm/px · 3 of 152 slices shown]
[im 31/152  lung]
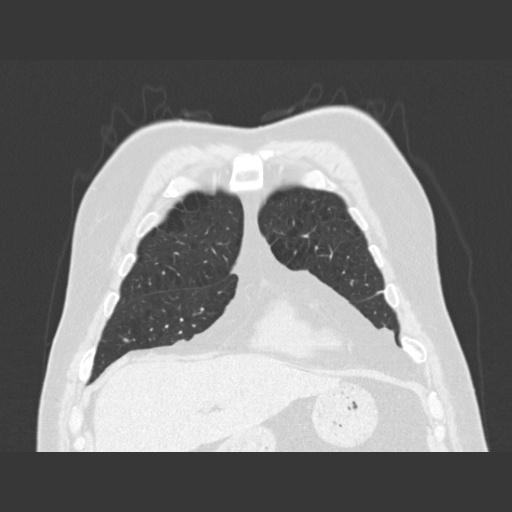
[im 61/152  lung]
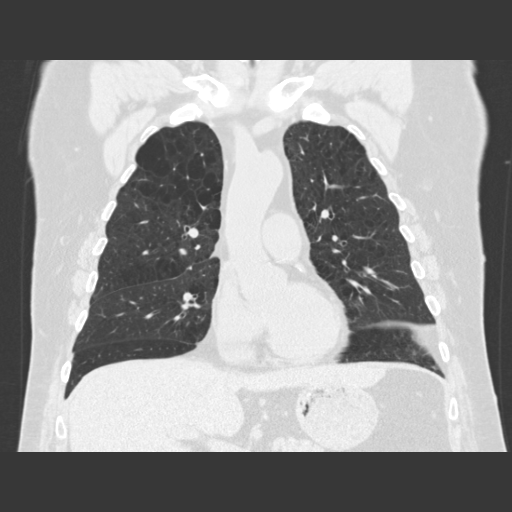
[im 91/152  lung]
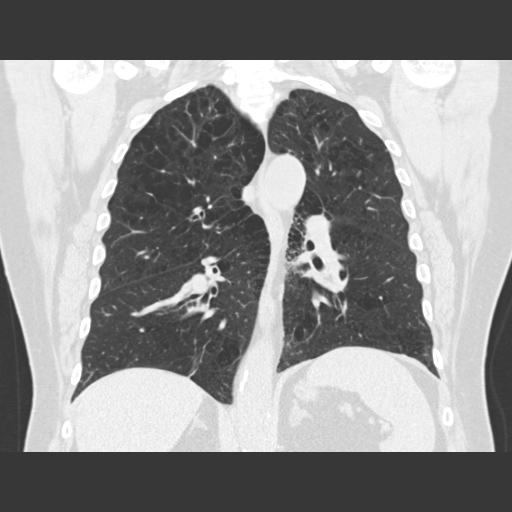

[15 of 36 positions shown; findings below may reference images not displayed]

FINDINGS: Mediastinum: The heart size is normal. No pericardial effusion.
Calcified atherosclerotic disease involves the thoracic aorta as
well as the LAD coronary artery. No mediastinal or hilar adenopathy.
The trachea appears patent and is midline. Normal appearance of the
esophagus. Low attenuation nodule in right lobe of thyroid gland is
again noted measuring 1.2 cm.

Lungs/Pleura: No pleural effusion. Moderate changes of centrilobular
and paraseptal emphysema identified. Mild diffuse bronchial wall
thickening noted. Stable 3 mm nodule in the superior segment of
right lower lobe. Pulmonary nodule within the left lower lobe
measures 5 mm and is unchanged from the previous exam. No new an no
enlarging pulmonary nodules or masses.

Upper Abdomen: Prior cholecystectomy. The visualized portions of the
liver and spleen are normal. There is a stable nodule within the
left adrenal gland measuring 1.2 cm.

Musculoskeletal: No aggressive lytic or sclerotic bone lesions. Mild
spondylosis identified within the mid thoracic spine.
IMPRESSION: 1. No acute cardiopulmonary abnormalities.
2. Stable pulmonary nodules the largest of which measures 5 mm. In a
patient that is at increased risk for lung cancer the next followup
exam should be obtained at 18-24 months.
3. Diffuse bronchial wall thickening with emphysema, as above;
imaging findings suggestive of underlying COPD.

## 2014-02-22 ENCOUNTER — Telehealth: Payer: Self-pay | Admitting: Pulmonary Disease

## 2014-02-22 NOTE — Telephone Encounter (Signed)
CT chest 02/20/14 >> moderate centrilobular/paraseptal emphysema, mild diffuse bronchial wall thickening, 3 mm nodule RLL and 5 mm nodule LLL no change  Will have my nurse inform pt that CT chest looks stable.  No change to the two nodules >> 1 in rt lower lung, and 1 in lt lower lung.  He will need f/u CT chest w/o contrast in December 2017.

## 2014-02-23 NOTE — Telephone Encounter (Signed)
LMTCB

## 2014-02-27 NOTE — Telephone Encounter (Signed)
Lmtcb. 02/27/14

## 2014-02-28 NOTE — Telephone Encounter (Signed)
Patient notified of CT results.  Nothing further needed.

## 2014-03-06 DIAGNOSIS — H2513 Age-related nuclear cataract, bilateral: Secondary | ICD-10-CM | POA: Diagnosis not present

## 2014-03-06 DIAGNOSIS — H4011X3 Primary open-angle glaucoma, severe stage: Secondary | ICD-10-CM | POA: Diagnosis not present

## 2014-03-13 ENCOUNTER — Other Ambulatory Visit: Payer: 59

## 2014-06-29 DIAGNOSIS — E119 Type 2 diabetes mellitus without complications: Secondary | ICD-10-CM | POA: Diagnosis not present

## 2014-06-29 DIAGNOSIS — E780 Pure hypercholesterolemia: Secondary | ICD-10-CM | POA: Diagnosis not present

## 2014-08-02 DIAGNOSIS — Z0000 Encounter for general adult medical examination without abnormal findings: Secondary | ICD-10-CM | POA: Diagnosis not present

## 2014-08-02 DIAGNOSIS — K439 Ventral hernia without obstruction or gangrene: Secondary | ICD-10-CM | POA: Diagnosis not present

## 2014-08-02 DIAGNOSIS — B079 Viral wart, unspecified: Secondary | ICD-10-CM | POA: Diagnosis not present

## 2014-08-02 DIAGNOSIS — G4733 Obstructive sleep apnea (adult) (pediatric): Secondary | ICD-10-CM | POA: Diagnosis not present

## 2014-09-05 DIAGNOSIS — E668 Other obesity: Secondary | ICD-10-CM | POA: Diagnosis not present

## 2014-09-05 DIAGNOSIS — I10 Essential (primary) hypertension: Secondary | ICD-10-CM | POA: Diagnosis not present

## 2014-09-05 DIAGNOSIS — J449 Chronic obstructive pulmonary disease, unspecified: Secondary | ICD-10-CM | POA: Diagnosis not present

## 2014-09-05 DIAGNOSIS — E785 Hyperlipidemia, unspecified: Secondary | ICD-10-CM | POA: Diagnosis not present

## 2014-09-05 DIAGNOSIS — I7090 Unspecified atherosclerosis: Secondary | ICD-10-CM | POA: Diagnosis not present

## 2014-09-05 DIAGNOSIS — R0789 Other chest pain: Secondary | ICD-10-CM | POA: Diagnosis not present

## 2014-09-05 DIAGNOSIS — R0602 Shortness of breath: Secondary | ICD-10-CM | POA: Diagnosis not present

## 2014-09-05 DIAGNOSIS — I25119 Atherosclerotic heart disease of native coronary artery with unspecified angina pectoris: Secondary | ICD-10-CM | POA: Diagnosis not present

## 2014-09-14 DIAGNOSIS — E785 Hyperlipidemia, unspecified: Secondary | ICD-10-CM | POA: Diagnosis not present

## 2014-09-14 DIAGNOSIS — I25119 Atherosclerotic heart disease of native coronary artery with unspecified angina pectoris: Secondary | ICD-10-CM | POA: Diagnosis not present

## 2014-09-14 DIAGNOSIS — I10 Essential (primary) hypertension: Secondary | ICD-10-CM | POA: Diagnosis not present

## 2014-09-14 DIAGNOSIS — E668 Other obesity: Secondary | ICD-10-CM | POA: Diagnosis not present

## 2014-09-14 DIAGNOSIS — R0789 Other chest pain: Secondary | ICD-10-CM | POA: Diagnosis not present

## 2014-09-14 DIAGNOSIS — I7090 Unspecified atherosclerosis: Secondary | ICD-10-CM | POA: Diagnosis not present

## 2014-09-14 DIAGNOSIS — J449 Chronic obstructive pulmonary disease, unspecified: Secondary | ICD-10-CM | POA: Diagnosis not present

## 2014-09-14 DIAGNOSIS — R0602 Shortness of breath: Secondary | ICD-10-CM | POA: Diagnosis not present

## 2014-10-09 ENCOUNTER — Ambulatory Visit: Payer: Medicare Other | Admitting: Pulmonary Disease

## 2014-11-20 ENCOUNTER — Ambulatory Visit (INDEPENDENT_AMBULATORY_CARE_PROVIDER_SITE_OTHER): Payer: 59 | Admitting: Pulmonary Disease

## 2014-11-20 ENCOUNTER — Encounter: Payer: Self-pay | Admitting: Pulmonary Disease

## 2014-11-20 VITALS — BP 138/70 | HR 84 | Temp 98.2°F | Ht 73.0 in | Wt 249.0 lb

## 2014-11-20 DIAGNOSIS — R911 Solitary pulmonary nodule: Secondary | ICD-10-CM

## 2014-11-20 DIAGNOSIS — Z23 Encounter for immunization: Secondary | ICD-10-CM

## 2014-11-20 DIAGNOSIS — J432 Centrilobular emphysema: Secondary | ICD-10-CM

## 2014-11-20 DIAGNOSIS — J9611 Chronic respiratory failure with hypoxia: Secondary | ICD-10-CM

## 2014-11-20 MED ORDER — FLUTICASONE FUROATE-VILANTEROL 100-25 MCG/INH IN AEPB: 1.0000 | INHALATION_SPRAY | Freq: Every day | RESPIRATORY_TRACT | Status: DC

## 2014-11-20 MED ORDER — ALBUTEROL SULFATE (2.5 MG/3ML) 0.083% IN NEBU: 2.5000 mg | INHALATION_SOLUTION | Freq: Four times a day (QID) | RESPIRATORY_TRACT | Status: DC | PRN

## 2014-11-20 NOTE — Progress Notes (Signed)
Chief Complaint  Patient presents with  . Follow-up    pt states he is still having some SOB, and cough once in a while, and wheezing all the time, but over all doing pretty well. pt wondering if he could have perscription for nebulizer.     History of Present Illness: James [name] is a 69 y.o. male former smoker with COPD/emphysema, hypoxemia needing 2 liters oxygen with exertion and sleep, and pulmonary nodule.  He was at beach.  He was with a friend who has a nebulizer, and this seemed to work better.  He uses ventolin several times per week.  He notices most of his trouble with activity especially when working in the yard.  He wants a POC so that he can take his oxygen to the beach.  TESTS: PSG 01/25/08 >> RDI 6.8, SpO2 low 84%.  Spent 79% of test time with SpO2 < 90% Spirometry 06/08/12 >> FEV1 1.91 (49%), FEV1% 59 CXR 06/08/12 >> changes of emphysema ONO with RA 06/16/12 >> Test time 9 hrs 21 min.  Mean SpO2 89%, low SpO2 63%.  Spent 2 hrs 25 min with SpO2 < 88% 07/07/12 >> SpO2 86% on room air with exertion >> start 2 liters oxygen with exertion and sleep A1AT 07/07/12 >> 154, PI-MM CT chest 03/23/13 >> mild/diffuse centrilobular emphysema, 4 mm nodule RLL superior segment, 4 mm nodule LLL CT chest 02/20/14 >> moderate centrilobular/paraseptal emphysema, mild diffuse bronchial wall thickening, 3 mm nodule RLL and 5 mm nodule LLL no change   PMHx >> Glaucoma, Depression, HLD, HTN  PSHx, Medications, Allergies, Fhx, Shx reviewed.  Physical Exam: BP 138/70 mmHg  Pulse 84  Temp(Src) 98.2 F (36.8 C) (Oral)  Ht '6\' 1"'$  (1.854 m)  Wt 249 lb (112.946 kg)  BMI 32.86 kg/m2  SpO2 94%  General - No distress ENT - No sinus tenderness, nasal septal deviation, MP 3, no oral exudate, no LAN Cardiac - s1s2 regular, no murmur Chest - Decrease breath sounds, no wheeze, prolonged exhalation Back - No focal tenderness Abd - Soft, non-tender Ext - No edema Neuro - Normal strength Skin - No  rashes Psych - normal mood, and behavior  Assessment/Plan:  COPD with emphysema. Plan: - will try him on breo - will arrange for home nebulizer with prn albuterol - he is to continue with ventolin prn  Chronic respiratory failure with nocturnal hypoxemia from COPD. Plan: - will continue 3 liters oxygen nightly - will try to arrange for portable oxygen concentrator  Lung nodule. Plan: - he will need f/u CT chest w/o contrast for December 2016   Chesley Mires, MD [facility] Pulmonary/Critical Care/Sleep Pager:  [phone]

## 2014-11-20 NOTE — Patient Instructions (Addendum)
Will schedule CT chest for December 2016 Breo one puff daily Will arrange for home nebulizer Albuterol nebulized every 4 to 6 hours as needed for cough, wheeze, or chest congestion Will try to arrange for portable oxygen concentrator Flu shot today  Follow up in 6 months

## 2015-02-02 DIAGNOSIS — H401133 Primary open-angle glaucoma, bilateral, severe stage: Secondary | ICD-10-CM | POA: Diagnosis not present

## 2015-02-05 ENCOUNTER — Inpatient Hospital Stay: Admission: RE | Admit: 2015-02-05 | Payer: Medicare Other | Source: Ambulatory Visit

## 2015-02-13 ENCOUNTER — Ambulatory Visit (INDEPENDENT_AMBULATORY_CARE_PROVIDER_SITE_OTHER)
Admission: RE | Admit: 2015-02-13 | Discharge: 2015-02-13 | Disposition: A | Discharge status: Home / Self Care | Payer: 59 | Source: Ambulatory Visit | Attending: Pulmonary Disease | Admitting: Pulmonary Disease

## 2015-02-13 DIAGNOSIS — J9611 Chronic respiratory failure with hypoxia: Secondary | ICD-10-CM | POA: Diagnosis not present

## 2015-02-13 DIAGNOSIS — R911 Solitary pulmonary nodule: Secondary | ICD-10-CM

## 2015-02-13 DIAGNOSIS — J432 Centrilobular emphysema: Secondary | ICD-10-CM

## 2015-02-13 IMAGING — CT CT CHEST W/O CM
2 of 3 series · 14 of 36 positions shown, 17 images · non-contrast
Comparison: CT chest dated [DATE] and [DATE]

CLINICAL DATA: One year follow-up pulmonary nodule

EXAM:
CT CHEST WITHOUT CONTRAST
TECHNIQUE: Multidetector CT imaging of the chest was performed following the
standard protocol without IV contrast.

[Series 2: thorax 5.0 i31f 2 · axial · 0.84mm/px · z∈[-157,+113]mm · 11 of 64 slices shown, 14 images]
[im 5/64  mediastinal]
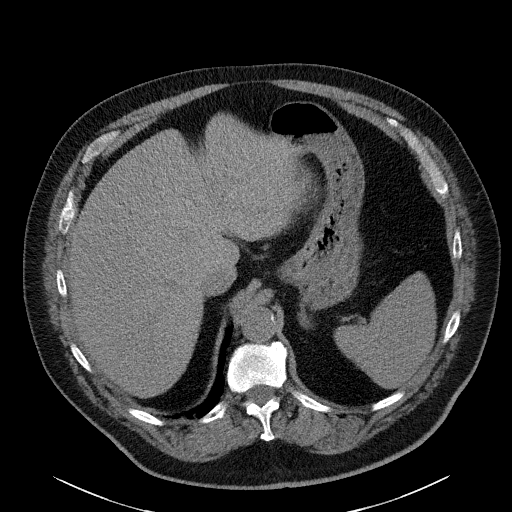
[im 5/64  lung]
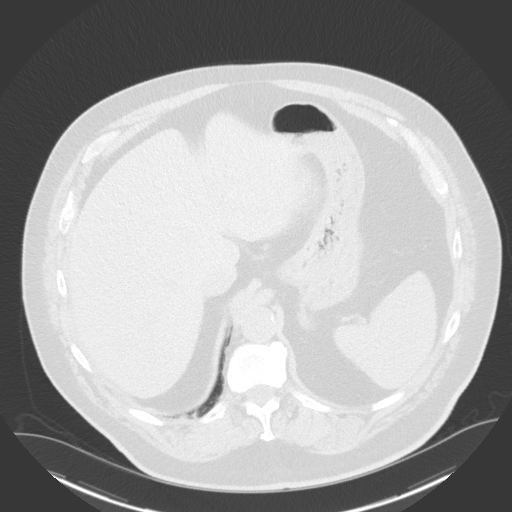
[im 10/64  lung]
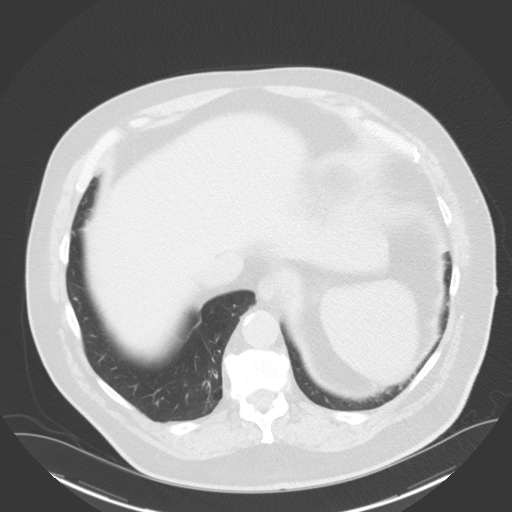
[im 15/64  lung]
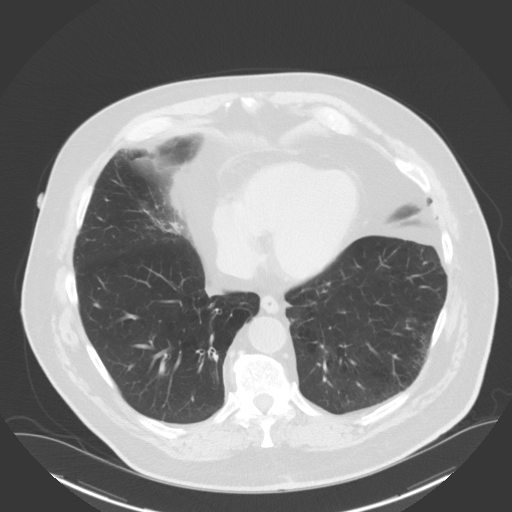
[im 22/64  lung]
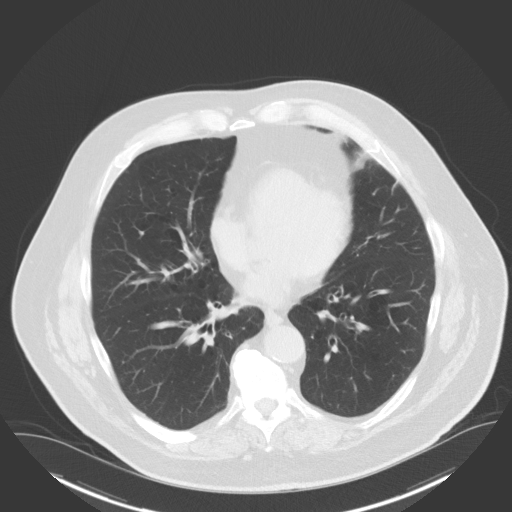
[im 26/64  mediastinal]
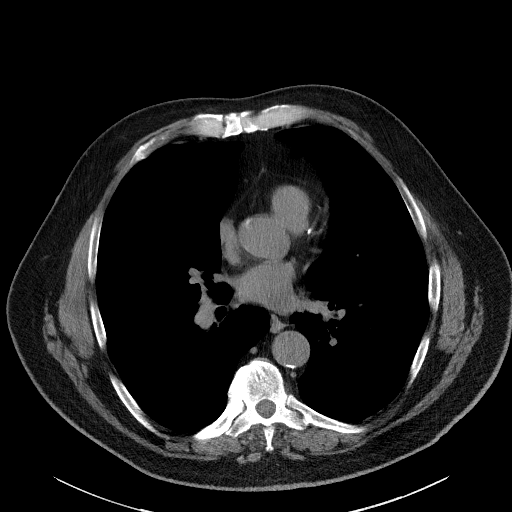
[im 26/64  lung]
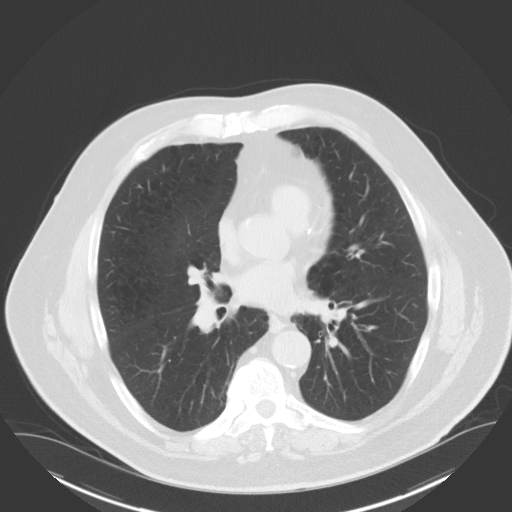
[im 33/64  lung]
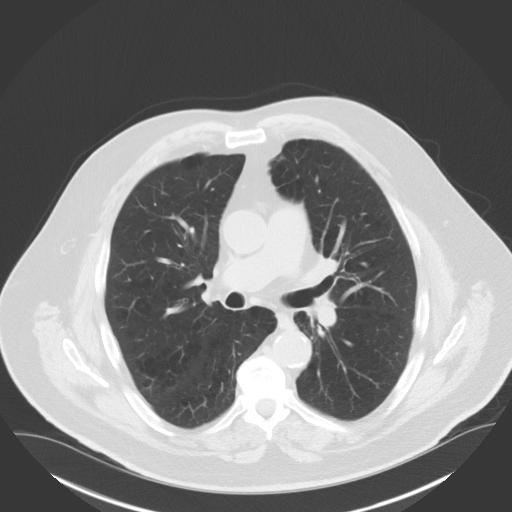
[im 38/64  lung]
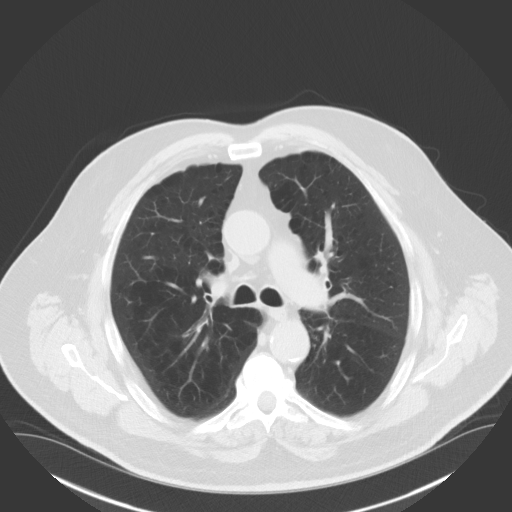
[im 43/64  lung]
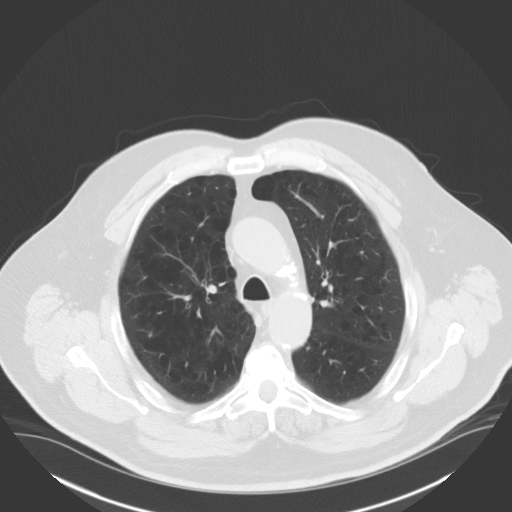
[im 50/64  mediastinal]
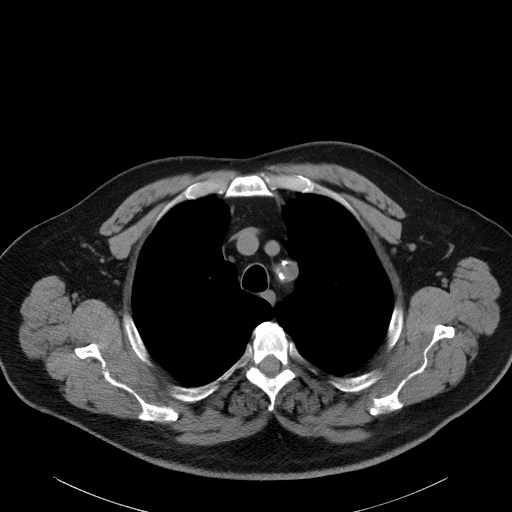
[im 50/64  lung]
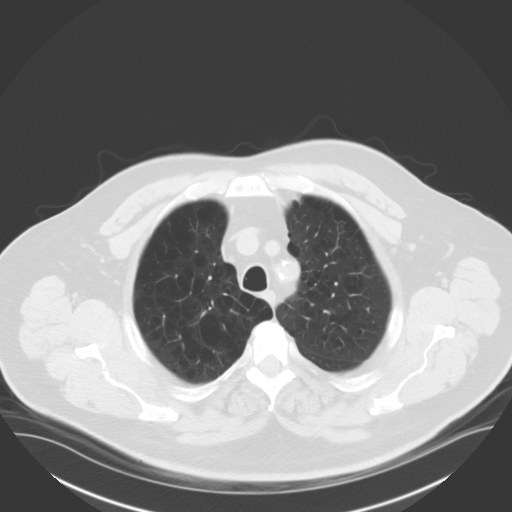
[im 54/64  lung]
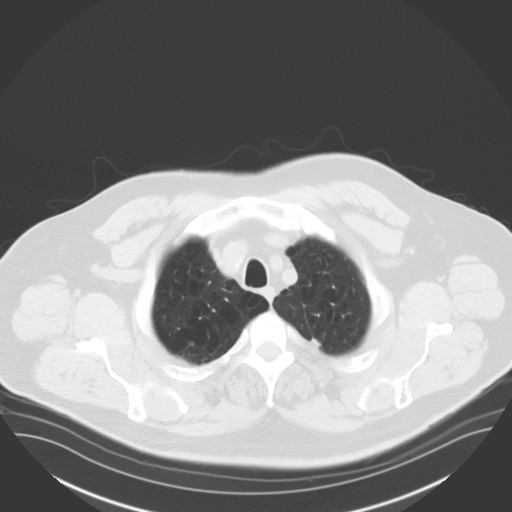
[im 59/64  lung]
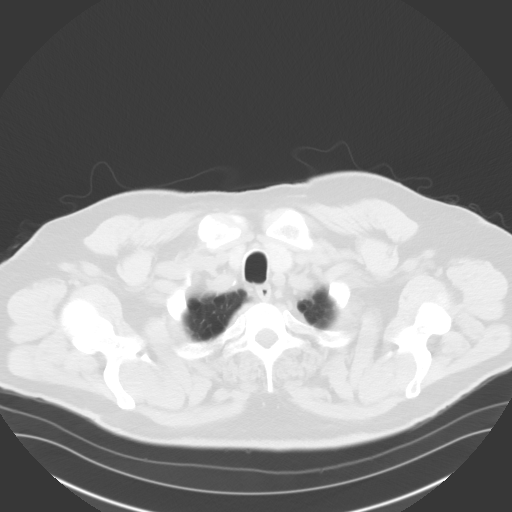

[Series 4: coronal · coronal · 0.55mm/px · 3 of 151 slices shown]
[im 31/151  lung]
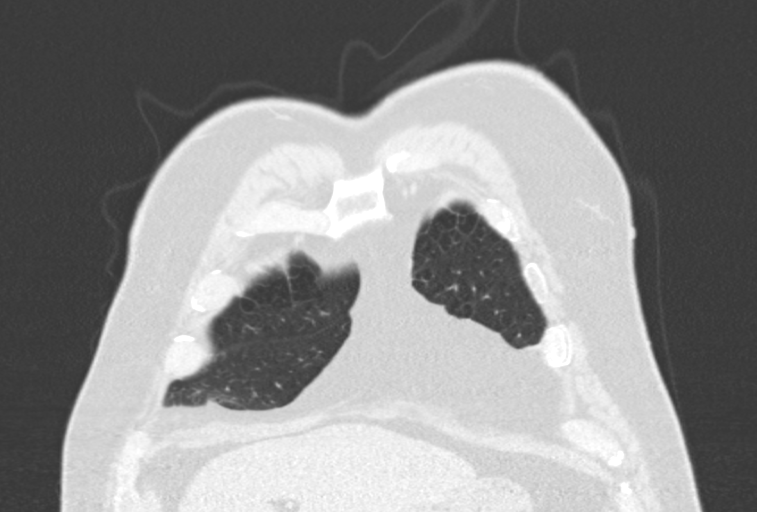
[im 61/151  lung]
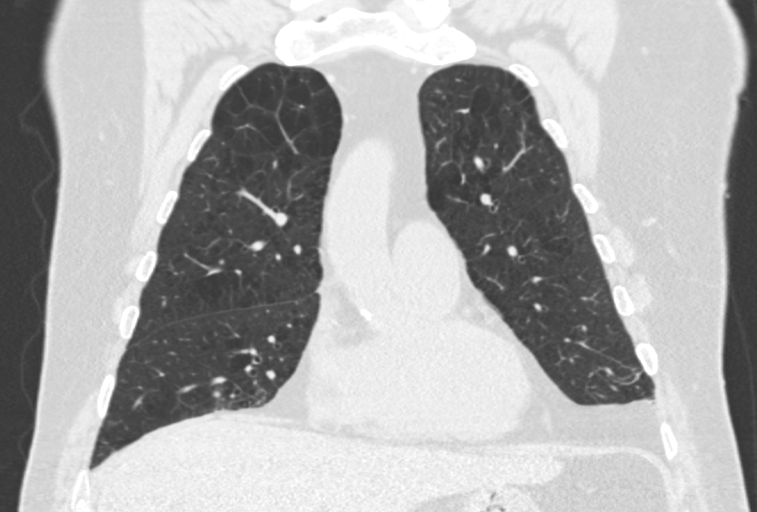
[im 91/151  lung]
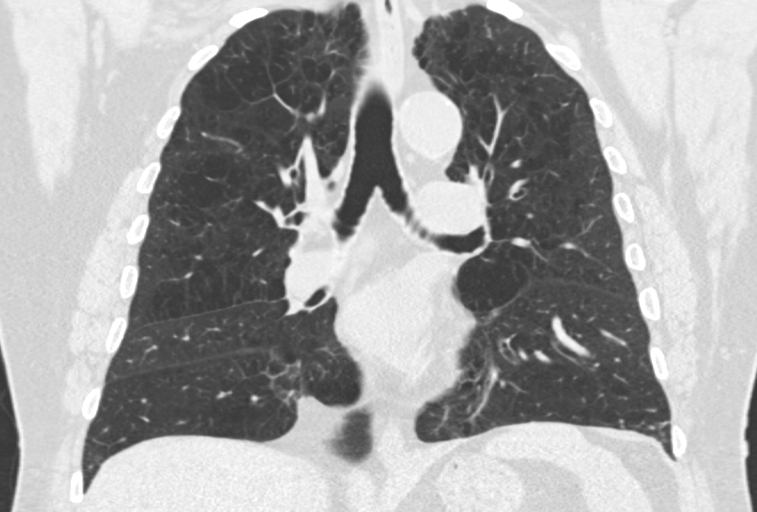

[14 of 36 positions shown; findings below may reference images not displayed]

FINDINGS: Mediastinum/Nodes: Heart is normal in size. No pericardial effusion.

Coronary atherosclerosis in the LAD and right coronary artery.

Atherosclerotic calcifications of the aortic arch.

Small mediastinal lymph nodes, including a 7 mm short axis
subcarinal node (series 2/ image 32), within normal limits.

Visualized thyroid is mildly enlarged/ nodular.

Lungs/Pleura: 3 mm right lower lobe pulmonary nodule (series 3/image
36), unchanged since [DATE], benign.

4 x 2 mm subpleural nodule in the left lower lobe (series 3/ image
41), unchanged since [DATE], benign.

6 x 3 mm subpleural nodule at the left lung base (series 3/ image
52), unchanged since [DATE], benign.

1.5 x 0.7 cm irregular nodular opacity in the posterior right lower
lobe (series 3/ image 48), essentially new.

Underlying moderate centrilobular and paraseptal emphysematous
changes.

No focal consolidation.

No pleural effusion or pneumothorax.

Upper abdomen: Visualized upper abdomen is notable for mild hepatic
steatosis.

Musculoskeletal: Degenerative changes of the visualized
thoracolumbar spine.
IMPRESSION: 15 x 7 mm irregular nodular opacity in the posterior right lower
lobe, essentially new from the prior studies. Primary bronchogenic
neoplasm is not excluded. Short-term follow-up CT chest or PET-CT is
suggested in 3 months.

Additional bilateral lower lobe pulmonary nodules are unchanged
since [DATE], benign.

## 2015-03-07 ENCOUNTER — Telehealth: Payer: Self-pay | Admitting: Pulmonary Disease

## 2015-03-07 NOTE — Telephone Encounter (Signed)
CT chest 02/13/15 >> new 1.5 cm nodule RLL.  Will have my nurse schedule ROV with me to review test results >> I have opened more office shifts this month (please check with Maryann Conners).

## 2015-03-07 NOTE — Telephone Encounter (Signed)
Waiting on schedule to open - will call and schedule as soon as these additional days are open.

## 2015-03-08 NOTE — Telephone Encounter (Signed)
LM for pt to schedule patient for OV per VS Please schedule patient on 03/12/15 with VS

## 2015-03-12 ENCOUNTER — Ambulatory Visit (INDEPENDENT_AMBULATORY_CARE_PROVIDER_SITE_OTHER): Payer: 59 | Admitting: Pulmonary Disease

## 2015-03-12 ENCOUNTER — Encounter: Payer: Self-pay | Admitting: Pulmonary Disease

## 2015-03-12 VITALS — BP 158/92 | HR 86 | Ht 73.0 in | Wt 255.0 lb

## 2015-03-12 DIAGNOSIS — J9611 Chronic respiratory failure with hypoxia: Secondary | ICD-10-CM | POA: Diagnosis not present

## 2015-03-12 DIAGNOSIS — J432 Centrilobular emphysema: Secondary | ICD-10-CM | POA: Diagnosis not present

## 2015-03-12 DIAGNOSIS — R911 Solitary pulmonary nodule: Secondary | ICD-10-CM

## 2015-03-12 NOTE — Telephone Encounter (Signed)
Pt seen today. Will sign off

## 2015-03-12 NOTE — Patient Instructions (Signed)
Will try to arrange for portable oxygen concentrator  Will schedule CT chest for first week in April 2017  Follow up as schedule on June 06, 2015

## 2015-03-12 NOTE — Progress Notes (Signed)
Current Outpatient Prescriptions on File Prior to Visit  Medication Sig  . albuterol (PROVENTIL) (2.5 MG/3ML) 0.083% nebulizer solution Take 3 mLs (2.5 mg total) by nebulization every 6 (six) hours as needed for wheezing or shortness of breath.  Marland Kitchen atorvastatin (LIPITOR) 20 MG tablet Take 20 mg by mouth daily.   Marland Kitchen buPROPion (WELLBUTRIN XL) 300 MG 24 hr tablet Take 300 mg by mouth daily.   . calcium gluconate 500 MG tablet Take 500 mg by mouth 2 (two) times daily.  . calcium-vitamin D (OSCAL WITH D) 250-125 MG-UNIT per tablet Take 1 tablet by mouth daily.  . cholecalciferol (VITAMIN D) 400 UNITS TABS Take 400 Units by mouth daily.   Marland Kitchen CINNAMON PO Take 1 capsule by mouth daily.    Nyoka Cowden Tea 150 MG CAPS Take by mouth daily.  . hydrochlorothiazide (HYDRODIURIL) 25 MG tablet Take 25 mg by mouth daily.   Marland Kitchen ibuprofen (ADVIL,MOTRIN) 200 MG tablet Take 600 mg by mouth.  . metroNIDAZOLE (METROCREAM) 0.75 % cream Apply topically as needed.  . Multiple Vitamins-Minerals (MULTIVITAMIN WITH MINERALS) tablet Take 1 tablet by mouth daily.    . OMEGA-3 1000 MG CAPS Take 1 g by mouth daily.  . ranitidine (ZANTAC) 75 MG tablet 75 mg. Take 75 mg by mouth every morning.  . sertraline (ZOLOFT) 100 MG tablet Take 100 mg by mouth daily.   . vardenafil (LEVITRA) 20 MG tablet Take 20 mg by mouth daily.  Marland Kitchen albuterol (VENTOLIN HFA) 108 (90 BASE) MCG/ACT inhaler Inhale 2 puffs into the lungs every 6 (six) hours as needed for wheezing or shortness of breath. (Patient not taking: Reported on 03/12/2015)   No current facility-administered medications on file prior to visit.     Chief Complaint  Patient presents with  . Follow-up    Discuss CT results. Needs refill of Albuterol hfa     Tests PSG 01/25/08 >> RDI 6.8, SpO2 low 84%. Spent 79% of test time with SpO2 < 90% Spirometry 06/08/12 >> FEV1 1.91 (49%), FEV1% 59 CXR 06/08/12 >> changes of emphysema ONO with RA 06/16/12 >> Test time 9 hrs 21 min. Mean SpO2 89%,  low SpO2 63%. Spent 2 hrs 25 min with SpO2 < 88% 07/07/12 >> SpO2 86% on room air with exertion >> start 2 liters oxygen with exertion and sleep A1AT 07/07/12 >> 154, PI-MM CT chest 03/23/13 >> mild/diffuse centrilobular emphysema, 4 mm nodule RLL superior segment, 4 mm nodule LLL CT chest 02/20/14 >> moderate centrilobular/paraseptal emphysema, mild diffuse bronchial wall thickening, 3 mm nodule RLL and 5 mm nodule LLL no change CT chest 02/13/15 >> new 1.5 cm nodule RLL.  Past medical hx Glaucoma, Depression, HLD, HTN  Past surgical hx, Allergies, Family hx, Social hx all reviewed.  Vital Signs BP 158/92 mmHg  Pulse 86  Ht '6\' 1"'$  (1.854 m)  Wt 255 lb (115.667 kg)  BMI 33.65 kg/m2  SpO2 94%  History of Present Illness James [name] is a 70 y.o. male former smoker with COPD/emphysema, chronic hypoxic respiratory failure on 2 liters oxygen, and pulmonary nodule.  He is here to review his CT chest.  This showed a linear, nodule in right lower lobe 1.5 cm which was new.  He remembers having a cold around Christmas time.  He has since recovered.  His main problem is related to dyspnea.  He does not use his oxygen with exertion >> tanks are too big.  He does use his oxygen at night.  He tried  Breo, but this didn't help.  He uses albuterol few times per week.  He is not having much cough, wheeze, or sputum.  He denies leg swelling.   Physical Exam  General - No distress ENT - No sinus tenderness, no oral exudate, no LAN Cardiac - s1s2 regular, no murmur Chest - No wheeze/rales/dullness Back - No focal tenderness Abd - Soft, non-tender Ext - No edema Neuro - Normal strength Skin - No rashes Psych - normal mood, and behavior   Assessment/Plan  COPD with emphysema. Didn't feel benefit from LAMA, LABA, ICS. Plan: - continue prn albuterol via HFA or nebulizer when at home  Chronic respiratory failure with nocturnal hypoxemia from COPD. Plan: - continue 2 liters oxygen  with exertion and sleep - will try to arrange for a portable oxygen concentrator through his DME  Lung nodule. ?if new RLL linear nodule is related to respiratory infection at time of his CT chest. Plan: - will arrange for f/u CT chest w/o contrast for April 2017   Patient Instructions  Will try to arrange for portable oxygen concentrator  Will schedule CT chest for first week in April 2017  Follow up as schedule on June 06, 2015     Chesley Mires, MD [place] Pulmonary/Critical Care/Sleep Pager:  [phone]

## 2015-03-28 DIAGNOSIS — H401133 Primary open-angle glaucoma, bilateral, severe stage: Secondary | ICD-10-CM | POA: Diagnosis not present

## 2015-03-28 DIAGNOSIS — H2513 Age-related nuclear cataract, bilateral: Secondary | ICD-10-CM | POA: Diagnosis not present

## 2015-03-28 DIAGNOSIS — H5442 Blindness, left eye, normal vision right eye: Secondary | ICD-10-CM | POA: Diagnosis not present

## 2015-05-03 ENCOUNTER — Encounter: Payer: Self-pay | Admitting: Gastroenterology

## 2015-05-07 DIAGNOSIS — D649 Anemia, unspecified: Secondary | ICD-10-CM | POA: Diagnosis not present

## 2015-05-07 DIAGNOSIS — R5383 Other fatigue: Secondary | ICD-10-CM | POA: Diagnosis not present

## 2015-05-07 DIAGNOSIS — Z8639 Personal history of other endocrine, nutritional and metabolic disease: Secondary | ICD-10-CM | POA: Diagnosis not present

## 2015-05-07 DIAGNOSIS — R251 Tremor, unspecified: Secondary | ICD-10-CM | POA: Diagnosis not present

## 2015-05-14 DIAGNOSIS — J449 Chronic obstructive pulmonary disease, unspecified: Secondary | ICD-10-CM | POA: Diagnosis not present

## 2015-05-14 DIAGNOSIS — R5383 Other fatigue: Secondary | ICD-10-CM | POA: Diagnosis not present

## 2015-05-14 DIAGNOSIS — D509 Iron deficiency anemia, unspecified: Secondary | ICD-10-CM | POA: Diagnosis not present

## 2015-05-14 DIAGNOSIS — Z23 Encounter for immunization: Secondary | ICD-10-CM | POA: Diagnosis not present

## 2015-05-21 ENCOUNTER — Ambulatory Visit (INDEPENDENT_AMBULATORY_CARE_PROVIDER_SITE_OTHER): Payer: 59 | Admitting: Neurology

## 2015-05-21 ENCOUNTER — Encounter: Payer: Self-pay | Admitting: Neurology

## 2015-05-21 VITALS — BP 144/62 | HR 80 | Resp 18 | Ht 73.0 in | Wt 250.0 lb

## 2015-05-21 DIAGNOSIS — G4733 Obstructive sleep apnea (adult) (pediatric): Secondary | ICD-10-CM | POA: Diagnosis not present

## 2015-05-21 DIAGNOSIS — G20C Parkinsonism, unspecified: Secondary | ICD-10-CM

## 2015-05-21 DIAGNOSIS — G20 Parkinson's disease: Secondary | ICD-10-CM | POA: Diagnosis not present

## 2015-05-21 DIAGNOSIS — R413 Other amnesia: Secondary | ICD-10-CM

## 2015-05-21 MED ORDER — CARBIDOPA-LEVODOPA 25-100 MG PO TABS: ORAL_TABLET | ORAL | Status: DC
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
## 2015-05-21 NOTE — Patient Instructions (Addendum)
We will do a brain MRI and call you.   We will monitor your symptoms.   We will try you on low dose Sinemet (generic name: carbidopa-levodopa) 25/100 mg: Take half a pill twice daily (8 AM and noon) for one week, then half a pill 3 times a day (8 AM, noon, and 4 PM)  thereafter. Please try to take the medication away from you mealtimes, that is, ideally either one hour before or 2 hours after your meal to ensure optimal absorption. The medication can interfere with the protein content of your meal and trying to the protein in your food and therefore not get fully absorbed.  Common side effects reported are: Nausea, vomiting, sedation, confusion, lightheadedness. Rare side effects include hallucinations, severe nausea or vomiting, diarrhea and significant drop in blood pressure especially when going from lying to standing or from sitting to standing.   Talk to Dr. [name] about retesting for sleep apnea.

## 2015-05-28 ENCOUNTER — Ambulatory Visit (INDEPENDENT_AMBULATORY_CARE_PROVIDER_SITE_OTHER)
Admission: RE | Admit: 2015-05-28 | Discharge: 2015-05-28 | Disposition: A | Discharge status: Home / Self Care | Payer: 59 | Source: Ambulatory Visit | Attending: Pulmonary Disease | Admitting: Pulmonary Disease

## 2015-05-28 DIAGNOSIS — J432 Centrilobular emphysema: Secondary | ICD-10-CM | POA: Diagnosis not present

## 2015-05-28 DIAGNOSIS — J9611 Chronic respiratory failure with hypoxia: Secondary | ICD-10-CM

## 2015-05-28 DIAGNOSIS — R911 Solitary pulmonary nodule: Secondary | ICD-10-CM

## 2015-05-28 IMAGING — CT CT CHEST W/O CM
2 of 3 series · 15 of 36 positions shown, 18 images · non-contrast
Comparison: [DATE] chest CT.

CLINICAL DATA: Follow-up pulmonary nodule. Chronic respiratory
failure with hypoxia.

EXAM:
CT CHEST WITHOUT CONTRAST
TECHNIQUE: Multidetector CT imaging of the chest was performed following the
standard protocol without IV contrast.

[Series 2: thorax · axial · 0.81mm/px · z∈[-348,-48]mm · 12 of 176 slices shown, 15 images]
[im 13/176  mediastinal]
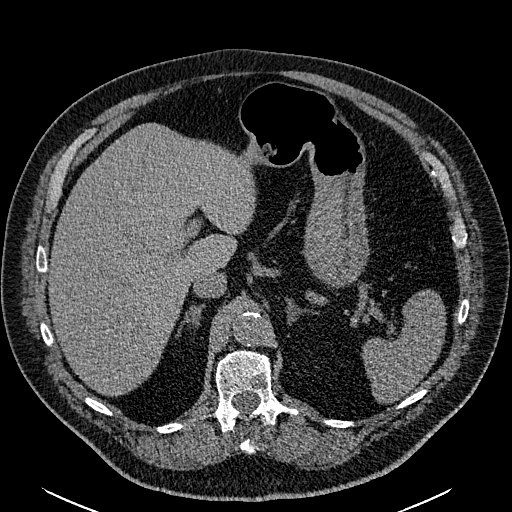
[im 13/176  lung]
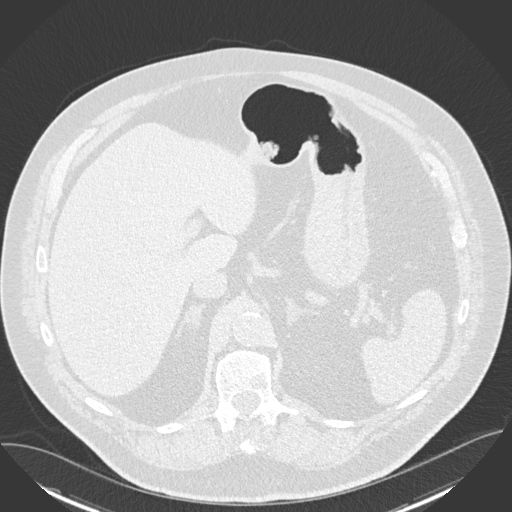
[im 26/176  lung]
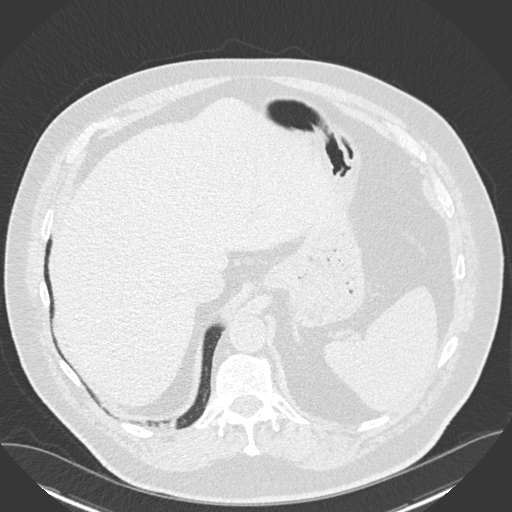
[im 39/176  lung]
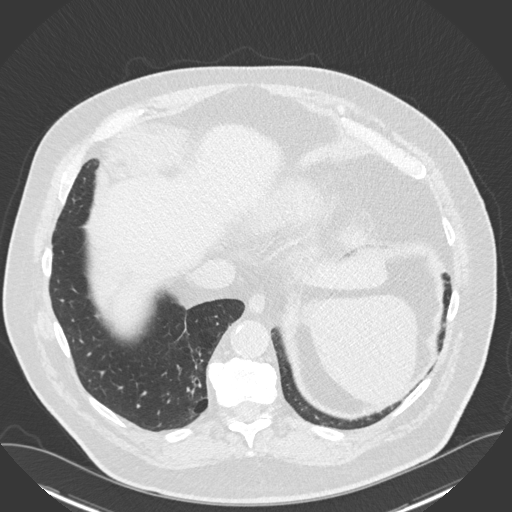
[im 52/176  lung]
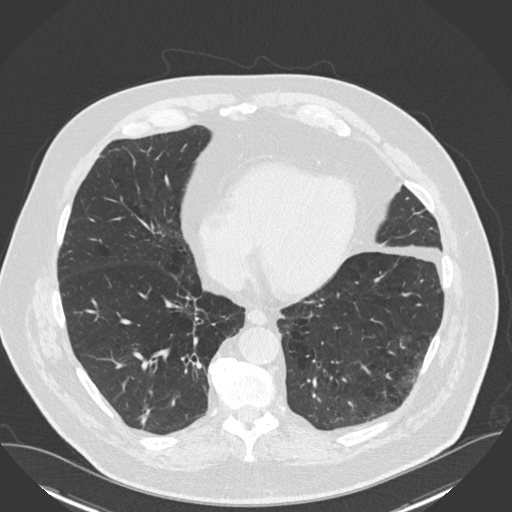
[im 65/176  mediastinal]
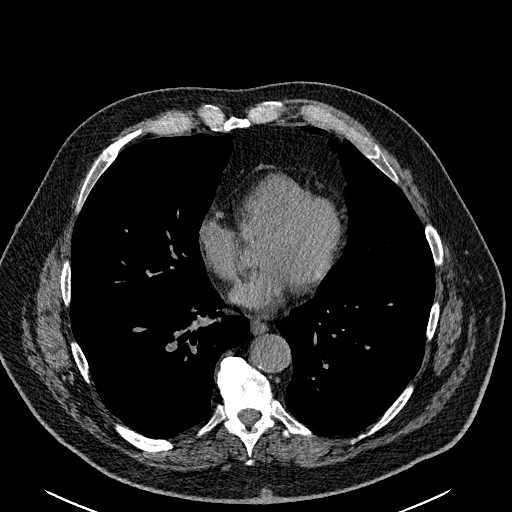
[im 65/176  lung]
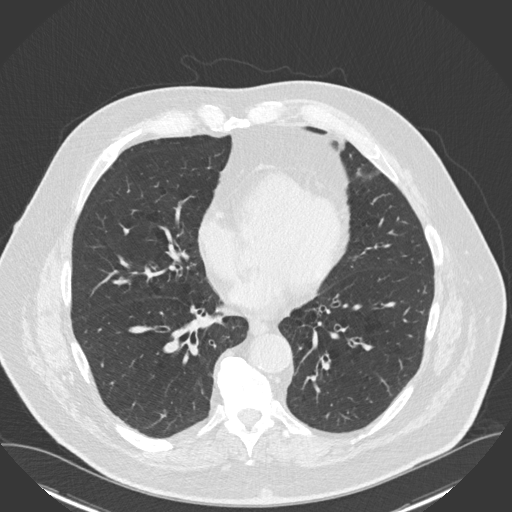
[im 78/176  lung]
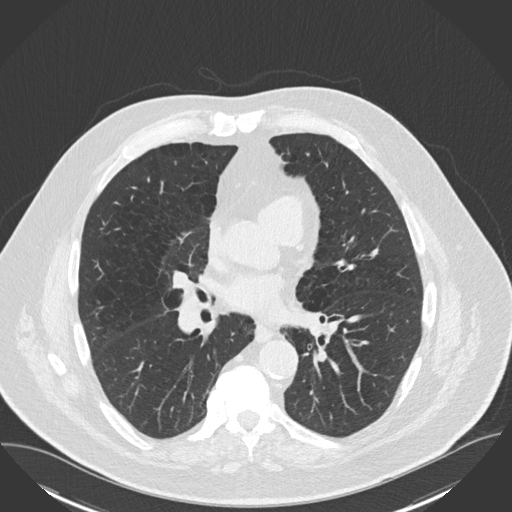
[im 98/176  lung]
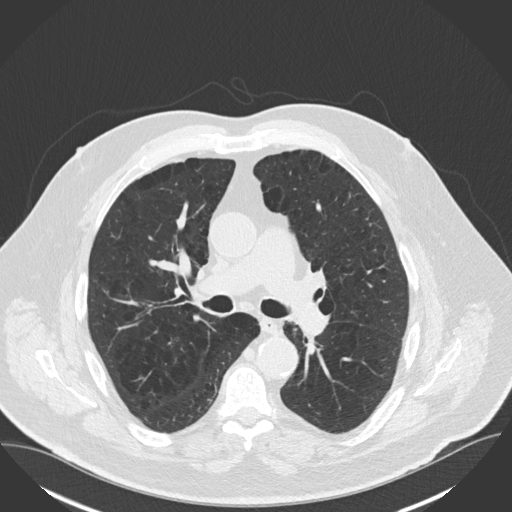
[im 111/176  lung]
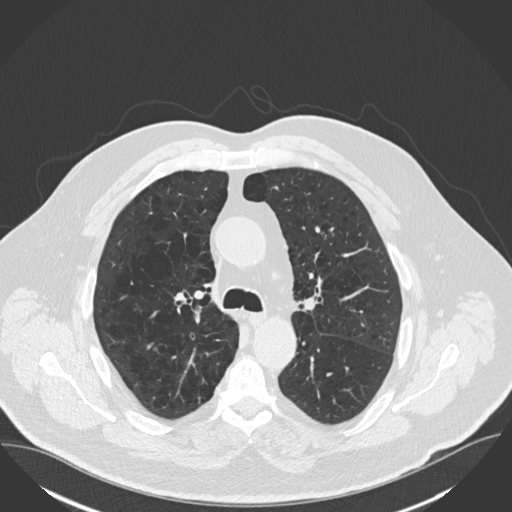
[im 124/176  mediastinal]
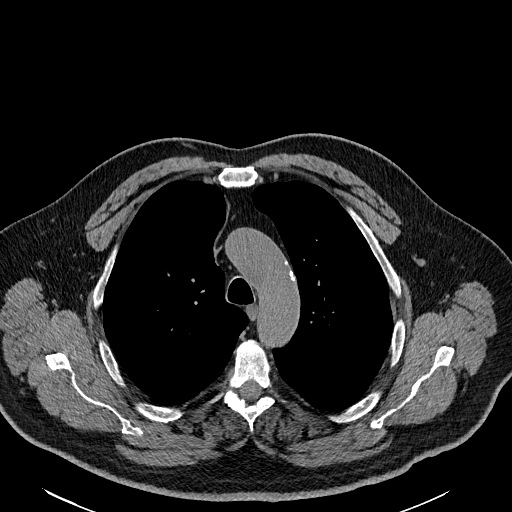
[im 124/176  lung]
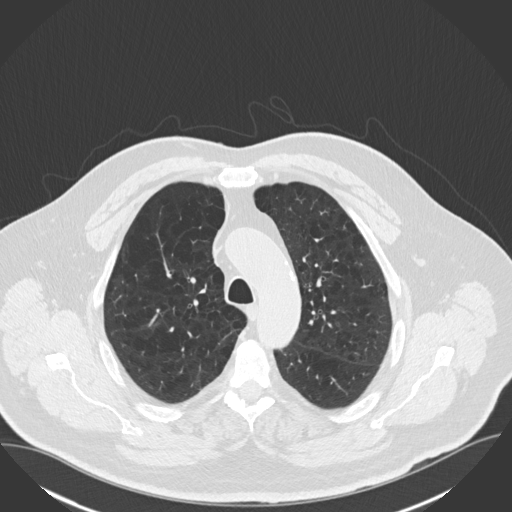
[im 137/176  lung]
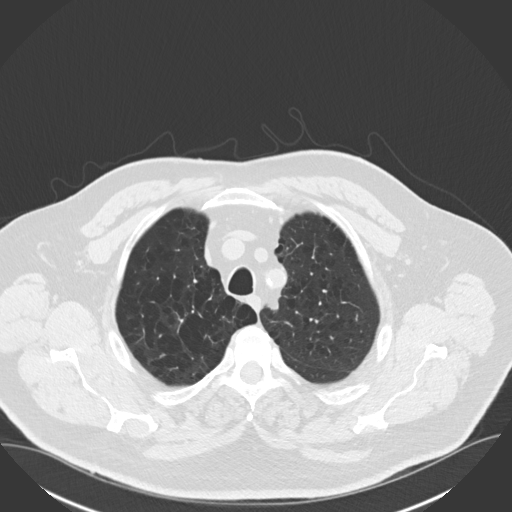
[im 150/176  lung]
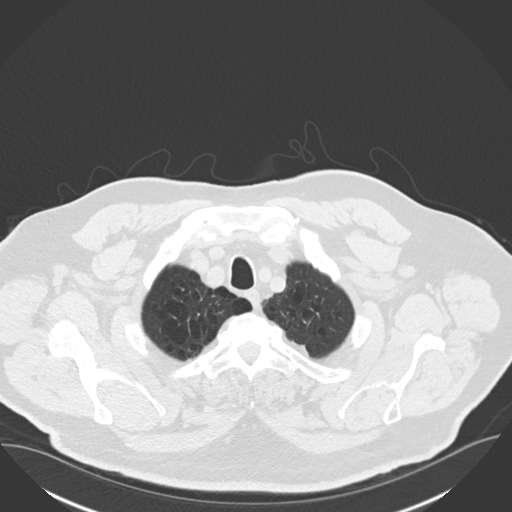
[im 163/176  lung]
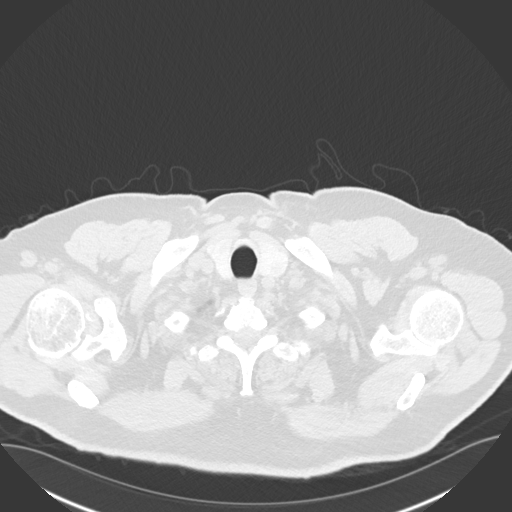

[Series 5: coronal · coronal · 0.69mm/px · 3 of 164 slices shown]
[im 33/164  lung]
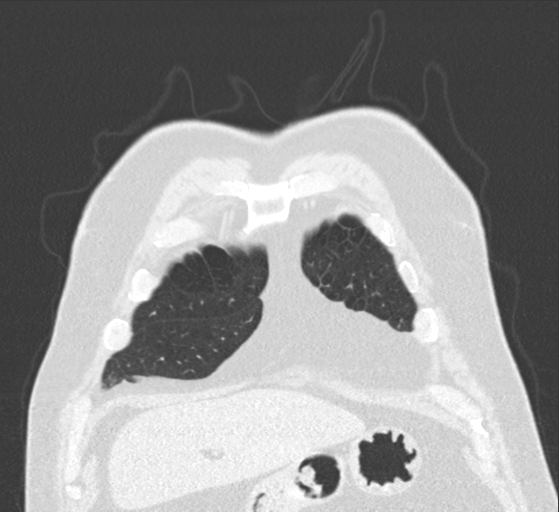
[im 66/164  lung]
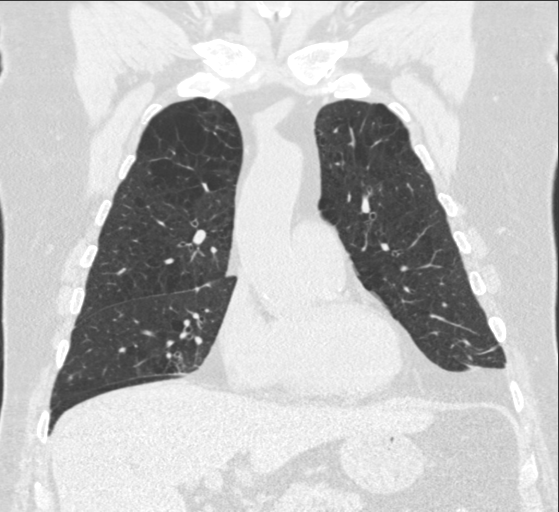
[im 98/164  lung]
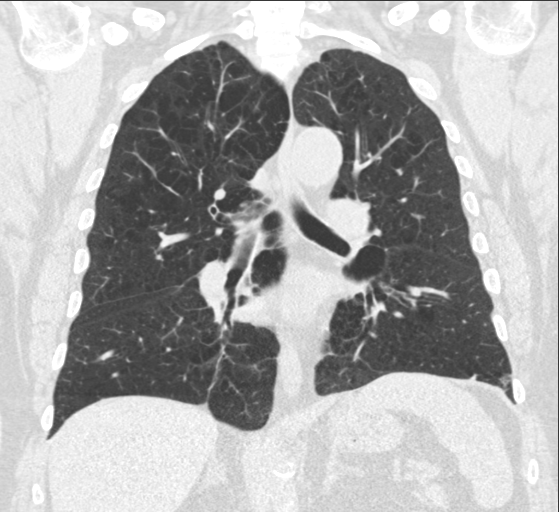

[15 of 36 positions shown; findings below may reference images not displayed]

FINDINGS: Mediastinum/Nodes: Normal heart size. No pericardial
fluid/thickening. Left main, left anterior descending and right
coronary atherosclerosis . Great vessels are normal in course and
caliber. Stable hypodense 1.3 cm left thyroid lobe nodule. Normal
esophagus. No pathologically enlarged axillary, mediastinal or gross
hilar lymph nodes, noting limited sensitivity for the detection of
hilar adenopathy on this noncontrast study.

Lungs/Pleura: No pneumothorax. No pleural effusion. Moderate to
severe centrilobular and paraseptal emphysema with diffuse bronchial
wall thickening. Irregular 2.0 x 1.7 cm posterior right lower lobe
pulmonary nodule (series 3/ image 122), previously 1.5 x 0.7 cm,
increased. Separate superior segment right lower lobe 3 mm pulmonary
nodule (series 3/ image 93) is stable since [DATE] and benign.
Posterior left upper lobe 3 mm solid pulmonary nodule (series 3/
image 102) is stable since [DATE] and benign. Left lower lobe 4
mm solid pulmonary nodule (series 3/ image 102) is stable since
[DATE] and benign. No acute consolidative airspace disease or
new significant pulmonary nodules. There is no appreciable change in
nonspecific subpleural reticulation at the dependent left greater
than right lung bases, which could represent hypoventilatory change.

Upper abdomen: The appearance of the visualized adrenal glands is
stable since [DATE], with mild irregular thickening of both
adrenal glands, without discrete adrenal nodule. Cholecystectomy.

Musculoskeletal: No aggressive appearing focal osseous lesions. Mild
degenerative changes in the thoracic spine. Stable superficial
subcutaneous 1.5 cm cystic lesion in the posterior lower midline
back, most suggestive of a sebaceous cyst.
IMPRESSION: 1. Interval growth of irregular posterior right lower lobe pulmonary
nodule, now 2.0 x 1.7 cm, highly suspicious for a primary
bronchogenic carcinoma. Recommend thoracic surgical consultation and
PET-CT for further staging.
2. No noncontrast CT evidence of thoracic adenopathy or metastatic
disease.
3. Additional findings include coronary atherosclerosis and moderate
to severe emphysema with diffuse bronchial wall thickening.

## 2015-05-29 ENCOUNTER — Telehealth: Payer: Self-pay | Admitting: Pulmonary Disease

## 2015-05-29 DIAGNOSIS — R918 Other nonspecific abnormal finding of lung field: Secondary | ICD-10-CM

## 2015-05-29 NOTE — Telephone Encounter (Signed)
CT chest 05/28/15 >> increased RLL irregular nodule to 2 cm  Results d/w pt  Will arrange for PET scan

## 2015-05-30 ENCOUNTER — Ambulatory Visit
Admission: RE | Admit: 2015-05-30 | Discharge: 2015-05-30 | Disposition: A | Discharge status: Home / Self Care | Payer: 59 | Source: Ambulatory Visit | Attending: Neurology | Admitting: Neurology

## 2015-05-30 ENCOUNTER — Other Ambulatory Visit: Payer: Self-pay | Admitting: Neurology

## 2015-05-30 DIAGNOSIS — Z139 Encounter for screening, unspecified: Secondary | ICD-10-CM

## 2015-05-30 DIAGNOSIS — G20C Parkinsonism, unspecified: Secondary | ICD-10-CM

## 2015-05-30 DIAGNOSIS — Z01818 Encounter for other preprocedural examination: Secondary | ICD-10-CM | POA: Diagnosis not present

## 2015-05-30 DIAGNOSIS — G20 Parkinson's disease: Secondary | ICD-10-CM

## 2015-05-30 IMAGING — CR DG ORBITS FOR FOREIGN BODY
2 series · 2 of 2 positions shown · non-contrast
Comparison: None.

CLINICAL DATA: Metal working/exposure; clearance prior to MRI

EXAM:
ORBITS FOR FOREIGN BODY - 2 VIEW

[w orbit pa (1 of 2)]
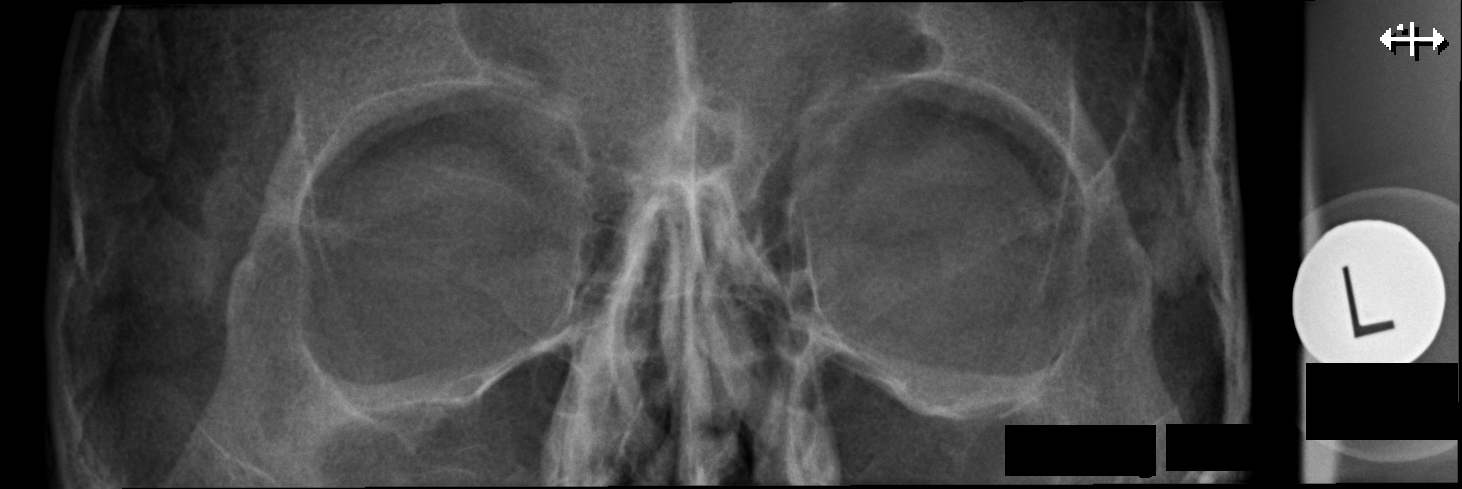

[w orbit pa (2 of 2)]
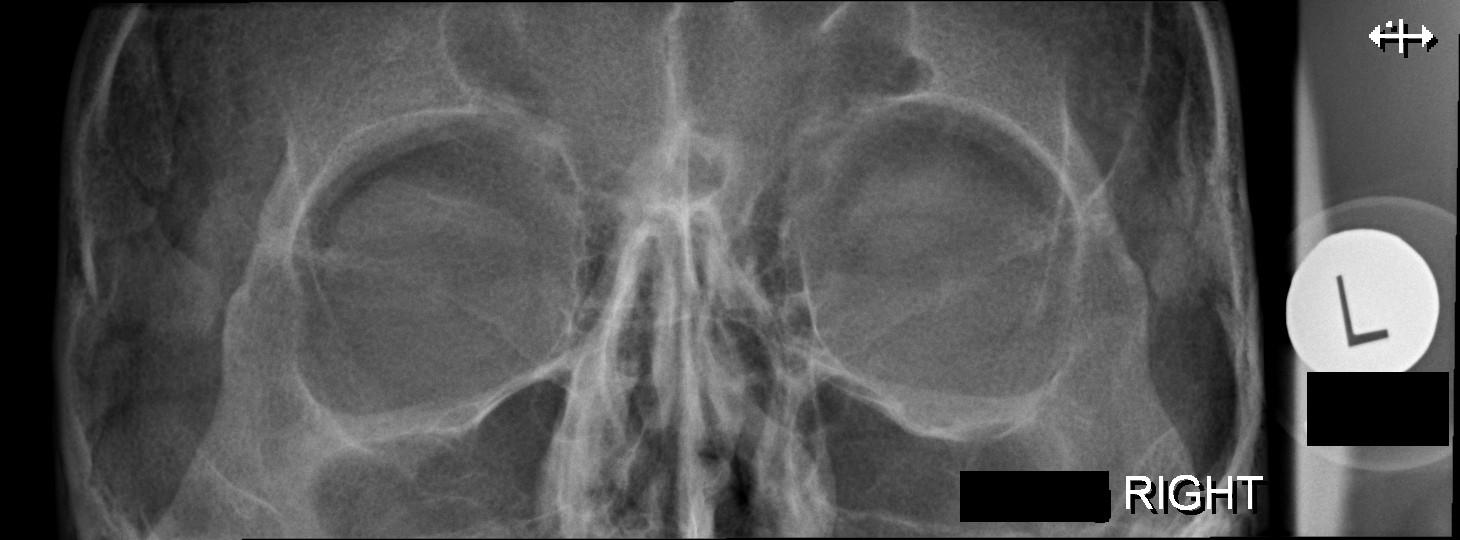

[2 of 2 positions shown; findings below may reference images not displayed]

FINDINGS: There is no evidence of metallic foreign body within the orbits. No
significant bone abnormality identified.
IMPRESSION: No evidence of metallic foreign body within the orbits.

## 2015-06-01 NOTE — Progress Notes (Signed)
Quick Note:  Please call patient regarding the recent brain MRI: The brain scan showed a normal structure of the brain and mild volume loss which we call atrophy. There were changes in the deeper structures of the brain, which we call white matter changes or microvascular changes. These were reported as mild in His case. These are tiny white spots, that occur with time and are seen in a variety of conditions, including with normal aging, chronic hypertension, chronic headaches, especially migraine HAs, chronic diabetes, chronic hyperlipidemia. These are not strokes and no mass or lesions were seen which is reassuring. Again, there were no acute findings, such as a stroke, or mass or blood products. No further action is required on this test at this time, other than re-enforcing the importance of good blood pressure control, good cholesterol control, good blood sugar control, and weight management. Please remind patient to keep any upcoming appointments or tests and to call us with any interim questions, concerns, problems or updates. Thanks,  Star Age, MD, PhD    ______

## 2015-06-04 DIAGNOSIS — R251 Tremor, unspecified: Secondary | ICD-10-CM | POA: Diagnosis not present

## 2015-06-04 DIAGNOSIS — D500 Iron deficiency anemia secondary to blood loss (chronic): Secondary | ICD-10-CM | POA: Diagnosis not present

## 2015-06-04 DIAGNOSIS — R195 Other fecal abnormalities: Secondary | ICD-10-CM | POA: Diagnosis not present

## 2015-06-04 DIAGNOSIS — R5383 Other fatigue: Secondary | ICD-10-CM | POA: Diagnosis not present

## 2015-06-05 ENCOUNTER — Telehealth: Payer: Self-pay | Admitting: Gastroenterology

## 2015-06-05 DIAGNOSIS — E1165 Type 2 diabetes mellitus with hyperglycemia: Secondary | ICD-10-CM | POA: Diagnosis not present

## 2015-06-05 DIAGNOSIS — D649 Anemia, unspecified: Secondary | ICD-10-CM | POA: Diagnosis not present

## 2015-06-05 NOTE — Telephone Encounter (Signed)
Dr. Fuller Plan patient is due for a recall colon in may.  Recent dx of anemia and heme positive stool.  I will place records from Dr. Pennie Banter office in yours.  Please advise if ok to set up direct colon or does he need office visit .  No APP appts and your next available is in June.  Please advise

## 2015-06-05 NOTE — Telephone Encounter (Signed)
Please obtain Fe, TIBC, ferritin, B12 and folate if not already done. OK for direct colonoscopy and EGD.

## 2015-06-05 NOTE — Telephone Encounter (Signed)
Patient had ferritin. Reviewed in records from Dr. [name], per Dr. Fuller Plan no need for additional labs.  Patient is on Home O2 3 liters at night.  Per Dr. Fuller Plan ok for double direct at [facility] Left message for patient to call back

## 2015-06-06 ENCOUNTER — Ambulatory Visit (INDEPENDENT_AMBULATORY_CARE_PROVIDER_SITE_OTHER): Payer: 59 | Admitting: Pulmonary Disease

## 2015-06-06 ENCOUNTER — Encounter: Payer: Self-pay | Admitting: Pulmonary Disease

## 2015-06-06 VITALS — BP 130/74 | HR 88 | Ht 73.0 in | Wt 251.0 lb

## 2015-06-06 DIAGNOSIS — J9611 Chronic respiratory failure with hypoxia: Secondary | ICD-10-CM

## 2015-06-06 DIAGNOSIS — R918 Other nonspecific abnormal finding of lung field: Secondary | ICD-10-CM

## 2015-06-06 DIAGNOSIS — J984 Other disorders of lung: Secondary | ICD-10-CM | POA: Diagnosis not present

## 2015-06-06 DIAGNOSIS — J432 Centrilobular emphysema: Secondary | ICD-10-CM

## 2015-06-06 NOTE — Patient Instructions (Signed)
You have PET Scan schedule for June 14, 2015 at [facility] >> will call with results  Follow up in 4 months

## 2015-06-06 NOTE — Progress Notes (Signed)
Current Outpatient Prescriptions on File Prior to Visit  Medication Sig  . albuterol (PROVENTIL) (2.5 MG/3ML) 0.083% nebulizer solution Take 3 mLs (2.5 mg total) by nebulization every 6 (six) hours as needed for wheezing or shortness of breath.  Marland Kitchen albuterol (VENTOLIN HFA) 108 (90 BASE) MCG/ACT inhaler Inhale 2 puffs into the lungs every 6 (six) hours as needed for wheezing or shortness of breath.  Marland Kitchen atorvastatin (LIPITOR) 20 MG tablet Take 20 mg by mouth daily.   Marland Kitchen buPROPion (WELLBUTRIN XL) 300 MG 24 hr tablet Take 300 mg by mouth daily.   . calcium-vitamin D (OSCAL WITH D) 250-125 MG-UNIT per tablet Take 1 tablet by mouth daily.  . carbidopa-levodopa (SINEMET IR) 25-100 MG tablet Take 1/2 pill twice daily x 1 week, then 1/2 pill 3 times a day thereafter.  Marland Kitchen CINNAMON PO Take 500 mg by mouth daily.   Nyoka Cowden Tea 150 MG CAPS Take by mouth daily.  . hydrochlorothiazide (HYDRODIURIL) 25 MG tablet Take 25 mg by mouth daily.   Marland Kitchen ibuprofen (ADVIL,MOTRIN) 200 MG tablet Take 600 mg by mouth.  . metroNIDAZOLE (METROCREAM) 0.75 % cream Apply topically as needed.  . Multiple Vitamins-Minerals (MULTIVITAMIN WITH MINERALS) tablet Take 1 tablet by mouth daily.    . OMEGA-3 1000 MG CAPS Take 1 g by mouth daily.  . ranitidine (ZANTAC) 75 MG tablet 75 mg. Take 75 mg by mouth every morning.  . sertraline (ZOLOFT) 100 MG tablet Take 100 mg by mouth daily.   . vardenafil (LEVITRA) 20 MG tablet Take 20 mg by mouth daily.   No current facility-administered medications on file prior to visit.     Chief Complaint  Patient presents with  . Follow-up    No change since last OV.      Tests PSG 01/25/08 >> RDI 6.8, SpO2 low 84%. Spent 79% of test time with SpO2 < 90% Spirometry 06/08/12 >> FEV1 1.91 (49%), FEV1% 59 CXR 06/08/12 >> changes of emphysema ONO with RA 06/16/12 >> Test time 9 hrs 21 min. Mean SpO2 89%, low SpO2 63%. Spent 2 hrs 25 min with SpO2 < 88% 07/07/12 >> SpO2 86% on room air with exertion >>  start 2 liters oxygen with exertion and sleep A1AT 07/07/12 >> 154, PI-MM CT chest 03/23/13 >> mild/diffuse centrilobular emphysema, 4 mm nodule RLL superior segment, 4 mm nodule LLL CT chest 02/20/14 >> moderate centrilobular/paraseptal emphysema, mild diffuse bronchial wall thickening, 3 mm nodule RLL and 5 mm nodule LLL no change CT chest 02/13/15 >> new 1.5 cm nodule RLL. CT chest 05/28/15 >> increased RLL irregular nodule to 2 cm   Past medical hx Glaucoma, Depression, HLD, HTN, DM  Past surgical hx, Allergies, Family hx, Social hx all reviewed.  Vital Signs BP 130/74 mmHg  Pulse 88  Ht '6\' 1"'$  (1.854 m)  Wt 251 lb (113.853 kg)  BMI 33.12 kg/m2  SpO2 95%  History of Present Illness James [name] is a 70 y.o. male former smoker with COPD/emphysema, chronic hypoxic respiratory failure on supplemental oxygen at night and exertion, and pulmonary nodule.  He is here to review his CT chest.  He has PET scan set up for 06/14/15.  He feels okay.  He is not having cough, wheeze, sputum, fever, chest pain, or hemoptysis.  He has not noticed gland swelling.  He doesn't use albuterol much.  He is using his oxygen at night.   Physical Exam  General - No distress ENT - No sinus tenderness, no  oral exudate, no LAN Cardiac - s1s2 regular, no murmur Chest - No wheeze/rales/dullness Back - No focal tenderness Abd - Soft, non-tender Ext - No edema Neuro - Normal strength Skin - No rashes Psych - normal mood, and behavior   Assessment/Plan  COPD with emphysema. Didn't feel benefit from LAMA, LABA, ICS. Plan: - continue prn albuterol via HFA or nebulizer when at home  Chronic respiratory failure with nocturnal hypoxemia from COPD. Plan: - continue 2 to 3 liters oxygen with exertion and sleep  Right lower lung mass >> increased in size from January to April 2017. - will call him with results of PET scan - discussed that he will likely need biopsy if PET scan is  positive   Patient Instructions  You have PET Scan schedule for June 14, 2015 at [facility] >> will call with results  Follow up in 4 months     Chesley Mires, MD [facility] Pager:  [phone] 06/06/2015, 2:11 PM

## 2015-06-06 NOTE — Telephone Encounter (Signed)
Left message for patient to call back  

## 2015-06-07 ENCOUNTER — Telehealth: Payer: Self-pay | Admitting: *Deleted

## 2015-06-07 NOTE — Telephone Encounter (Signed)
-----   Message from Star Age, MD sent at 06/01/2015  2:13 PM EDT ----- Please call patient regarding the recent brain MRI: The brain scan showed a normal structure of the brain and mild volume loss which we call atrophy. There were changes in the deeper structures of the brain, which we call white matter changes or microvascular changes. These were reported as mild in His case. These are tiny white spots, that occur with time and are seen in a variety of conditions, including with normal aging, chronic hypertension, chronic headaches, especially migraine HAs, chronic diabetes, chronic hyperlipidemia. These are not strokes and no mass or lesions were seen which is reassuring. Again, there were no acute findings, such as a stroke, or mass or blood products. No further action is required on this test at this time, other than re-enforcing the importance of good blood pressure control, good cholesterol control, good blood sugar control, and weight management. Please remind patient to keep any upcoming appointments or tests and to call us with any interim questions, concerns, problems or updates. Thanks,  Star Age, MD, PhD

## 2015-06-07 NOTE — Telephone Encounter (Signed)
Called and spoke to pt/wife about results per Dr [name] note. They both verbalized understanding. Wife stated Dr [name] stated it showed he had parkinson's. Advised there was no mention of this in Dr Guadelupe Sabin results and I would have to verify with her when she is back in the office next week. She is out of the office this week. Pt/wife verbalized understanding.

## 2015-06-11 ENCOUNTER — Other Ambulatory Visit: Payer: Self-pay

## 2015-06-11 DIAGNOSIS — D509 Iron deficiency anemia, unspecified: Secondary | ICD-10-CM

## 2015-06-11 DIAGNOSIS — Z86010 Personal history of colonic polyps: Secondary | ICD-10-CM

## 2015-06-11 DIAGNOSIS — R195 Other fecal abnormalities: Secondary | ICD-10-CM

## 2015-06-11 NOTE — Telephone Encounter (Signed)
Patient is scheduled with his wife for 07/12/15 for pre-visit and colon at [facility] for 07/25/15 8:30

## 2015-06-11 NOTE — Telephone Encounter (Signed)
Left message for patient to call back  

## 2015-06-14 ENCOUNTER — Ambulatory Visit (HOSPITAL_COMMUNITY)
Admission: RE | Admit: 2015-06-14 | Discharge: 2015-06-14 | Disposition: A | Discharge status: Home / Self Care | Payer: 59 | Source: Ambulatory Visit | Attending: Pulmonary Disease | Admitting: Pulmonary Disease

## 2015-06-14 DIAGNOSIS — R918 Other nonspecific abnormal finding of lung field: Secondary | ICD-10-CM

## 2015-06-14 DIAGNOSIS — N200 Calculus of kidney: Secondary | ICD-10-CM | POA: Diagnosis not present

## 2015-06-14 DIAGNOSIS — R911 Solitary pulmonary nodule: Secondary | ICD-10-CM | POA: Diagnosis not present

## 2015-06-14 DIAGNOSIS — I2510 Atherosclerotic heart disease of native coronary artery without angina pectoris: Secondary | ICD-10-CM | POA: Insufficient documentation

## 2015-06-14 DIAGNOSIS — E1165 Type 2 diabetes mellitus with hyperglycemia: Secondary | ICD-10-CM | POA: Diagnosis not present

## 2015-06-14 DIAGNOSIS — I700 Atherosclerosis of aorta: Secondary | ICD-10-CM | POA: Diagnosis not present

## 2015-06-14 DIAGNOSIS — J439 Emphysema, unspecified: Secondary | ICD-10-CM | POA: Insufficient documentation

## 2015-06-14 DIAGNOSIS — J984 Other disorders of lung: Secondary | ICD-10-CM | POA: Insufficient documentation

## 2015-06-14 LAB — GLUCOSE, CAPILLARY: GLUCOSE-CAPILLARY: 162 mg/dL — AB (ref 65–99)

## 2015-06-14 IMAGING — CT NM PET TUM IMG INITIAL (PI) SKULL BASE T - THIGH
1 of 8 series · 1 of 25 positions shown · non-contrast
Comparison: [DATE]

CLINICAL DATA: Initial treatment strategy for pulmonary nodule.

EXAM:
NUCLEAR MEDICINE PET SKULL BASE TO THIGH
TECHNIQUE: 12.5 mCi F-18 FDG was injected intravenously. Full-ring PET imaging
was performed from the skull base to thigh after the radiotracer. CT
data was obtained and used for attenuation correction and anatomic
localization.
FASTING BLOOD GLUCOSE:  Value: 162 mg/dl

[Series 2: ac ct sk_thigh 5.0 hd_fov · axial · 5.0mm · 1.52mm/px · 1 of 243 slices shown]
[im 1/243  brain]
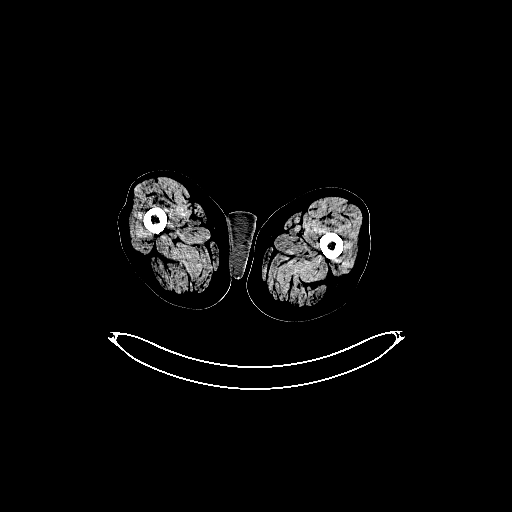

[1 of 25 positions shown; findings below may reference images not displayed]

FINDINGS: NECK

No hypermetabolic lymph nodes in the neck.

CHEST

No hypermetabolic mediastinal or hilar nodes. Aortic atherosclerosis
noted. There is also LAD coronary artery calcification. The
irregular nodule in the posterior right lower lobe measures 11 mm
and has an SUV max equal to 1.93. There are moderate changes of
centrilobular and paraseptal emphysema.

ABDOMEN/PELVIS

No abnormal hypermetabolic activity within the liver, pancreas,
adrenal glands, or spleen. Left renal calculi identified. Partially
collapsed exophytic cyst arising from mid left kidney measuring
cm. Aortic atherosclerosis noted. No hypermetabolic lymph nodes in
the abdomen or pelvis.

SKELETON

No focal hypermetabolic activity to suggest skeletal metastasis.
IMPRESSION: 1. Low level, malignant range FDG uptake is associated with the
irregular nodule in the posterior right lower lobe. The SUV max is
equal to 1.93. Early pulmonary neoplasm cannot be excluded.
2. No evidence for hypermetabolic hilar or mediastinal lymph nodes
or distant metastatic disease.
3. Aortic atherosclerosis and coronary artery calcification
4. Emphysema.
5. Nephrolithiasis.

## 2015-06-14 MED ORDER — FLUDEOXYGLUCOSE F - 18 (FDG) INJECTION
12.5300 | Freq: Once | INTRAVENOUS | Status: AC | PRN
  Administered 2015-06-14: 12.53 via INTRAVENOUS

## 2015-06-15 ENCOUNTER — Telehealth: Payer: Self-pay | Admitting: Pulmonary Disease

## 2015-06-15 NOTE — Telephone Encounter (Signed)
PET scan 06/14/15 >> 1.93 SUV RLL 11 mm lesion   Attempted to call pt to discuss results >> no answer at listed numbers.  Will try calling again later.  Has low grade uptake in RLL nodule on CT chest, but appears smaller in size compared to CT chest from 05/28/15.  Options will be to have IR assess for needle biopsy versus arrange for PFT and then consider thoracic surgery assessment versus continue radiographic monitoring.

## 2015-06-15 NOTE — Telephone Encounter (Signed)
Spoke with pt.  Reviewed options on how to proceed.  He would like to d/w his wife.  Advised him to call back on 06/18/15 and let me know what option he chooses.

## 2015-06-18 ENCOUNTER — Telehealth: Payer: Self-pay | Admitting: Pulmonary Disease

## 2015-06-18 DIAGNOSIS — R918 Other nonspecific abnormal finding of lung field: Secondary | ICD-10-CM

## 2015-06-18 NOTE — Telephone Encounter (Signed)
Spoke with pt and he states that he would like to proceed with surgery as you had mentioned.   VS, please advise if CTS referral is needed. Thanks!

## 2015-06-18 NOTE — Telephone Encounter (Signed)
Orders/refferal placed.   LMTCB

## 2015-06-18 NOTE — Telephone Encounter (Signed)
Please schedule for pulmonary function test to assess status of COPD, and referral to cardiothoracic surgery to assess right lower lung nodule.

## 2015-06-19 NOTE — Telephone Encounter (Signed)
Pt has already spoken with Judeen Hammans yesterday afternoon to schedule PFT and to discuss referral.  Will close encounter

## 2015-06-21 DIAGNOSIS — E119 Type 2 diabetes mellitus without complications: Secondary | ICD-10-CM | POA: Diagnosis not present

## 2015-06-26 ENCOUNTER — Ambulatory Visit (INDEPENDENT_AMBULATORY_CARE_PROVIDER_SITE_OTHER): Payer: 59 | Admitting: Pulmonary Disease

## 2015-06-26 DIAGNOSIS — R918 Other nonspecific abnormal finding of lung field: Secondary | ICD-10-CM

## 2015-06-26 LAB — PULMONARY FUNCTION TEST
DL/VA % pred: 54 %
DL/VA: 2.6 ml/min/mmHg/L
DLCO UNC: 16.61 ml/min/mmHg
DLCO cor % pred: 46 %
DLCO cor: 17.01 ml/min/mmHg
DLCO unc % pred: 45 %
FEF 25-75 PRE: 0.87 L/s
FEF 25-75 Post: 1.25 L/sec
FEF2575-%Change-Post: 43 %
FEF2575-%Pred-Post: 44 %
FEF2575-%Pred-Pre: 31 %
FEV1-%CHANGE-POST: 22 %
FEV1-%PRED-POST: 59 %
FEV1-%PRED-PRE: 48 %
FEV1-POST: 2.19 L
FEV1-PRE: 1.79 L
FEV1FVC-%Change-Post: 2 %
FEV1FVC-%Pred-Pre: 74 %
FEV6-%CHANGE-POST: 16 %
FEV6-%PRED-POST: 78 %
FEV6-%Pred-Pre: 66 %
FEV6-POST: 3.66 L
FEV6-PRE: 3.13 L
FEV6FVC-%CHANGE-POST: -1 %
FEV6FVC-%PRED-POST: 100 %
FEV6FVC-%PRED-PRE: 101 %
FVC-%CHANGE-POST: 18 %
FVC-%Pred-Post: 77 %
FVC-%Pred-Pre: 65 %
FVC-Post: 3.85 L
FVC-Pre: 3.24 L
POST FEV1/FVC RATIO: 57 %
PRE FEV6/FVC RATIO: 97 %
Post FEV6/FVC ratio: 95 %
Pre FEV1/FVC ratio: 55 %
RV % PRED: 113 %
RV: 2.95 L
TLC % PRED: 98 %
TLC: 7.51 L

## 2015-06-26 NOTE — Progress Notes (Signed)
PFT done today. 

## 2015-06-27 ENCOUNTER — Encounter: Payer: Medicare Other | Admitting: Thoracic Surgery (Cardiothoracic Vascular Surgery)

## 2015-06-28 ENCOUNTER — Encounter: Payer: Self-pay | Admitting: Thoracic Surgery (Cardiothoracic Vascular Surgery)

## 2015-06-28 ENCOUNTER — Institutional Professional Consult (permissible substitution) (INDEPENDENT_AMBULATORY_CARE_PROVIDER_SITE_OTHER): Payer: 59 | Admitting: Thoracic Surgery (Cardiothoracic Vascular Surgery)

## 2015-06-28 VITALS — BP 148/82 | HR 89 | Resp 18 | Ht 73.0 in | Wt 246.0 lb

## 2015-06-28 DIAGNOSIS — J432 Centrilobular emphysema: Secondary | ICD-10-CM | POA: Diagnosis not present

## 2015-06-28 DIAGNOSIS — D381 Neoplasm of uncertain behavior of trachea, bronchus and lung: Secondary | ICD-10-CM

## 2015-06-28 NOTE — Progress Notes (Signed)
PCP is Horatio Pel, MD Referring Provider is Chesley Mires, MD  Chief Complaint  Patient presents with  . Lung Lesion    CT CHEST 05/28/15, PET 06/14/15, PFT 06/26/15    HPI: James [name] is sent for consultation regarding a right lower lobe lung nodule.  James [name] is a 70 year old man with a history of tobacco abuse(2 packs per day for 50 years), COPD, pulmonary nodules, sleep apnea, type 2 diabetes, depression, hypertension, osteoarthritis, glaucoma, and parkinsonism. He has been followed with chest CTs by Dr. Halford Chessman. In December 2016 there was a new 15 mm x 7 mm right lower lobe nodule. A repeat scan in April showed more of an L-shaped appearance to this nodule and it measured 20 mm in greatest dimension. 3 weeks later a PET/CT was done. The nodule is mildly hypermetabolic with an SUV of 1.9. Of note the nodule was considerably smaller at 13 mm and no longer had an L shaped appearance.  He has 100-pack-year history of smoking before quitting a year and a half ago. He gets short of breath with exertion such as walking up 2 flights of stairs. He also experiences chest tightness along with the shortness of breath, but says he saw a cardiologist a year ago and there were no cardiac issues. He does have a frequent productive cough and often has wheezing. He uses home oxygen at night, but does not use routinely use it during the day. He has lost 4 pounds over the past 3 months when she had previous to diet. He was recently diagnosed with type 2 diabetes and started on metformin. He also was recently seen by neurology and started on Sinemet for tremors.  Zubrod Score: At the time of surgery this patient's most appropriate activity status/level should be described as: '[]'$     0    Normal activity, no symptoms '[x]'$     1    Restricted in physical strenuous activity but ambulatory, able to do out light work '[]'$     2    Ambulatory and capable of self care, unable to do work activities, up and about >50 % of  waking hours                              '[]'$     3    Only limited self care, in bed greater than 50% of waking hours '[]'$     4    Completely disabled, no self care, confined to bed or chair '[]'$     5    Moribund    Past Medical History  Diagnosis Date  . Allergy     seasonal  . Blood transfusion     2002  . COPD (chronic obstructive pulmonary disease) ([place])   . Depression   . Hyperlipidemia   . Blind left eye     legally  . Tubular adenoma of colon 03/2011  . Diverticulosis   . Internal and external bleeding hemorrhoids   . Osteoarthritis   . Hypertension   . Insomnia   . Iron deficiency anemia   . Rosacea   . ED (erectile dysfunction)   . Glaucoma   . Tremor of both hands   . OSA (obstructive sleep apnea)   . Diabetes mellitus, type 2 [employer])     Past Surgical History  Procedure Laterality Date  . Partial colectomy  2008  . Clavicle surgery  [place]    right  .  Knee arthroscopy  1996  . Cholecystectomy    . Tonsillectomy    . Thyroid cyst excision    . Arthroscopic repair acl      Family History  Problem Relation Age of Onset  . CAD Father   . Alcohol abuse Father   . Heart disease Father   . COPD Mother   . Asthma Mother   . Emphysema Mother   . Cancer Maternal Grandmother     Social History Social History  Substance Use Topics  . Smoking status: Former Smoker -- 1.50 packs/day for 50 years    Types: Cigarettes    Quit date: 04/29/2013  . Smokeless tobacco: Never Used  . Alcohol Use: 0.0 oz/week    0 Standard drinks or equivalent per week     Comment: rarely beer    Current Outpatient Prescriptions  Medication Sig Dispense Refill  . albuterol (PROVENTIL) (2.5 MG/3ML) 0.083% nebulizer solution Take 3 mLs (2.5 mg total) by nebulization every 6 (six) hours as needed for wheezing or shortness of breath. 120 vial 5  . albuterol (VENTOLIN HFA) 108 (90 BASE) MCG/ACT inhaler Inhale 2 puffs into the lungs every 6 (six) hours as needed for wheezing or  shortness of breath. 1 Inhaler 5  . atorvastatin (LIPITOR) 20 MG tablet Take 20 mg by mouth daily.     Marland Kitchen buPROPion (WELLBUTRIN XL) 300 MG 24 hr tablet Take 300 mg by mouth daily.     . calcium-vitamin D (OSCAL WITH D) 250-125 MG-UNIT per tablet Take 1 tablet by mouth daily.    . carbidopa-levodopa (SINEMET IR) 25-100 MG tablet Take 1/2 pill twice daily x 1 week, then 1/2 pill 3 times a day thereafter. 45 tablet 3  . CINNAMON PO Take 500 mg by mouth daily.     Nyoka Cowden Tea 150 MG CAPS Take by mouth daily.    . hydrochlorothiazide (HYDRODIURIL) 25 MG tablet Take 25 mg by mouth daily.     Marland Kitchen ibuprofen (ADVIL,MOTRIN) 200 MG tablet Take 600 mg by mouth.    . metFORMIN (GLUCOPHAGE) 500 MG tablet Take by mouth 2 (two) times daily with a meal.    . metroNIDAZOLE (METROCREAM) 0.75 % cream Apply topically as needed.    . Multiple Vitamins-Minerals (MULTIVITAMIN WITH MINERALS) tablet Take 1 tablet by mouth daily.      . OMEGA-3 1000 MG CAPS Take 1 g by mouth daily.    . ranitidine (ZANTAC) 75 MG tablet 75 mg. Take 75 mg by mouth every morning.    . sertraline (ZOLOFT) 100 MG tablet Take 100 mg by mouth daily.     . vardenafil (LEVITRA) 20 MG tablet Take 20 mg by mouth daily.     No current facility-administered medications for this visit.    Allergies  Allergen Reactions  . Codeine Itching  . Morphine Itching    Review of Systems  Constitutional: Positive for fatigue. Negative for fever, chills, activity change and unexpected weight change (has lost 4 pounds in 3 months with diet).  HENT: Positive for congestion and dental problem (dentures). Negative for trouble swallowing and voice change.   Eyes: Positive for visual disturbance (blurry).  Respiratory: Positive for apnea, chest tightness, shortness of breath and wheezing.        Uses home O2 at night  Cardiovascular: Negative for palpitations and leg swelling.  Gastrointestinal: Positive for abdominal pain (reflux) and diarrhea.   Genitourinary: Positive for frequency. Negative for dysuria.  Musculoskeletal: Positive for myalgias and arthralgias.  Neurological: Positive for dizziness, tremors and numbness. Negative for weakness.       Memory problems  Hematological: Negative for adenopathy. Does not bruise/bleed easily.  All other systems reviewed and are negative.   BP 148/82 mmHg  Pulse 89  Resp 18  Ht '6\' 1"'$  (1.854 m)  Wt 246 lb (111.585 kg)  BMI 32.46 kg/m2  SpO2 93% Physical Exam  Constitutional: He is oriented to person, place, and time. He appears well-developed and well-nourished. No distress.  HENT:  Head: Normocephalic and atraumatic.  Eyes: Conjunctivae and EOM are normal. No scleral icterus.  Neck: Neck supple. No thyromegaly present.  Cardiovascular: Normal rate and regular rhythm.  Exam reveals no gallop and no friction rub.   No murmur heard. Pulmonary/Chest: Effort normal. No respiratory distress. He has no wheezes. He has no rales.  Diminished BS bilaterally  Abdominal: Soft. He exhibits no distension. There is no tenderness.  Musculoskeletal: Normal range of motion. He exhibits no edema.  Lymphadenopathy:    He has no cervical adenopathy.  Neurological: He is alert and oriented to person, place, and time. No cranial nerve deficit.  + tremor left hand. No focal motor weakness  Vitals reviewed.    Diagnostic Tests: CT CHEST WITHOUT CONTRAST  TECHNIQUE: Multidetector CT imaging of the chest was performed following the standard protocol without IV contrast.  COMPARISON: 02/13/2015 chest CT.  FINDINGS: Mediastinum/Nodes: Normal heart size. No pericardial fluid/thickening. Left main, left anterior descending and right coronary atherosclerosis . Great vessels are normal in course and caliber. Stable hypodense 1.3 cm left thyroid lobe nodule. Normal esophagus. No pathologically enlarged axillary, mediastinal or gross hilar lymph nodes, noting limited sensitivity for the  detection of hilar adenopathy on this noncontrast study.  Lungs/Pleura: No pneumothorax. No pleural effusion. Moderate to severe centrilobular and paraseptal emphysema with diffuse bronchial wall thickening. Irregular 2.0 x 1.7 cm posterior right lower lobe pulmonary nodule (series 3/ image 122), previously 1.5 x 0.7 cm, increased. Separate superior segment right lower lobe 3 mm pulmonary nodule (series 3/ image 93) is stable since 03/23/2013 and benign. Posterior left upper lobe 3 mm solid pulmonary nodule (series 3/ image 102) is stable since 03/23/2013 and benign. Left lower lobe 4 mm solid pulmonary nodule (series 3/ image 102) is stable since 03/15/2013 and benign. No acute consolidative airspace disease or new significant pulmonary nodules. There is no appreciable change in nonspecific subpleural reticulation at the dependent left greater than right lung bases, which could represent hypoventilatory change.  Upper abdomen: The appearance of the visualized adrenal glands is stable since 03/23/2013, with mild irregular thickening of both adrenal glands, without discrete adrenal nodule. Cholecystectomy.  Musculoskeletal: No aggressive appearing focal osseous lesions. Mild degenerative changes in the thoracic spine. Stable superficial subcutaneous 1.5 cm cystic lesion in the posterior lower midline back, most suggestive of a sebaceous cyst.  IMPRESSION: 1. Interval growth of irregular posterior right lower lobe pulmonary nodule, now 2.0 x 1.7 cm, highly suspicious for a primary bronchogenic carcinoma. Recommend thoracic surgical consultation and PET-CT for further staging. 2. No noncontrast CT evidence of thoracic adenopathy or metastatic disease. 3. Additional findings include coronary atherosclerosis and moderate to severe emphysema with diffuse bronchial wall thickening.  NUCLEAR MEDICINE PET SKULL BASE TO THIGH  TECHNIQUE: 12.5 mCi F-18 FDG was injected  intravenously. Full-ring PET imaging was performed from the skull base to thigh after the radiotracer. CT data was obtained and used for attenuation correction and anatomic localization.  FASTING BLOOD GLUCOSE: Value: 162  mg/dl  COMPARISON: 02/13/2015  FINDINGS: NECK  No hypermetabolic lymph nodes in the neck.  CHEST  No hypermetabolic mediastinal or hilar nodes. Aortic atherosclerosis noted. There is also LAD coronary artery calcification. The irregular nodule in the posterior right lower lobe measures 11 mm and has an SUV max equal to 1.93. There are moderate changes of centrilobular and paraseptal emphysema.  ABDOMEN/PELVIS  No abnormal hypermetabolic activity within the liver, pancreas, adrenal glands, or spleen. Left renal calculi identified. Partially collapsed exophytic cyst arising from mid left kidney measuring 3.7 cm. Aortic atherosclerosis noted. No hypermetabolic lymph nodes in the abdomen or pelvis.  SKELETON  No focal hypermetabolic activity to suggest skeletal metastasis.  IMPRESSION: 1. Low level, malignant range FDG uptake is associated with the irregular nodule in the posterior right lower lobe. The SUV max is equal to 1.93. Early pulmonary neoplasm cannot be excluded. 2. No evidence for hypermetabolic hilar or mediastinal lymph nodes or distant metastatic disease. 3. Aortic atherosclerosis and coronary artery calcification 4. Emphysema. 5. Nephrolithiasis.   Electronically Signed  By: Kerby Moors M.D.  On: 06/14/2015 11:59  Electronically Signed  By: Ilona Sorrel M.D.  On: 05/28/2015 13:26  FVC= 3.24(65%), POST= 3.85(73%) FEV1= 1.79(48%), POST= 2.19(59%) DLCO= 46% predicted  I personally reviewed the PET/CT and CT scans from 05/28/2015 and 02/23/2015 and concur with the findings as noted above.   Impression: 70 year old male with history of heavy tobacco abuse and multiple medical issues who has a right lower lobe  lung nodule. On CT he has multiple nodules majority of which are small and stable. There was a new nodule on the CT in December. This nodule was fairly linear and measured 7 x 15 mm. On an interval CT in April 3 months later, the nodule had an L-shaped appearance and measured 20 mm in greatest dimension. A PET CT about 2-1/2 weeks later showed the nodule to be smaller at 13 mm and the "L" component had resolved. Nodule was mildly hypermetabolic with an SUV of 7.56.  Although I cannot rule out the possibility that this is a malignancy, the rapid increase and decrease in size is not consistent with the behavior of a malignancy. I suspect what was accounting for the increase in size on the CT was some adjacent atelectasis that resolved. I do not think there is a strong indication for surgery at this point particularly given his increased risk for perioperative morbidity. I would not completely rule him out a surgical candidate, but would not recommend it in this setting as the nodule has gotten smaller over time. This could be an infectious or inflammatory nodule. It also could be a low-grade malignancy, but if that is the case there is a little downside to waiting for more definitive change to be evident radiographically.  I reviewed the CT scans and PET/CT with James [name]. We discussed these issues at length. My recommendation was continued radiographic follow-up. He is in agreement with that recommendation. I will defer to Dr. Halford Chessman for the radiographic follow-up, but will be happy to see James [name] back at any time if I can be of any further assistance with his care.  Plan: Recommend a CT scan in 3-4 months  I spent 30 minutes with James [name] during this visit, > 50% of the time was spent in counseling James Nakayama, MD [facility] [phone]

## 2015-07-12 ENCOUNTER — Ambulatory Visit (AMBULATORY_SURGERY_CENTER): Payer: Self-pay

## 2015-07-12 VITALS — Ht 73.0 in | Wt 243.4 lb

## 2015-07-12 DIAGNOSIS — R195 Other fecal abnormalities: Secondary | ICD-10-CM

## 2015-07-12 MED ORDER — SUPREP BOWEL PREP KIT 17.5-3.13-1.6 GM/177ML PO SOLN: 1.0000 | Freq: Once | ORAL | Status: DC

## 2015-07-12 NOTE — Progress Notes (Signed)
No allergies to eggs or soy No past problems with anesthesia No diet meds USES O2 AT NIGHT AS NEEDED  No internet

## 2015-07-25 ENCOUNTER — Ambulatory Visit (HOSPITAL_COMMUNITY): Payer: 59 | Admitting: Anesthesiology

## 2015-07-25 ENCOUNTER — Encounter (HOSPITAL_COMMUNITY): Payer: Self-pay

## 2015-07-25 ENCOUNTER — Ambulatory Visit (HOSPITAL_COMMUNITY)
Admission: RE | Admit: 2015-07-25 | Discharge: 2015-07-25 | Disposition: A | Discharge status: Home / Self Care | Payer: 59 | Source: Ambulatory Visit | Attending: Gastroenterology | Admitting: Gastroenterology

## 2015-07-25 ENCOUNTER — Encounter (HOSPITAL_COMMUNITY)
Admission: RE | Disposition: A | Discharge status: Home / Self Care | Payer: Self-pay | Source: Ambulatory Visit | Attending: Gastroenterology

## 2015-07-25 DIAGNOSIS — Z79899 Other long term (current) drug therapy: Secondary | ICD-10-CM | POA: Diagnosis not present

## 2015-07-25 DIAGNOSIS — R911 Solitary pulmonary nodule: Secondary | ICD-10-CM | POA: Diagnosis not present

## 2015-07-25 DIAGNOSIS — F329 Major depressive disorder, single episode, unspecified: Secondary | ICD-10-CM | POA: Diagnosis not present

## 2015-07-25 DIAGNOSIS — Z860100 Personal history of colon polyps, unspecified: Secondary | ICD-10-CM | POA: Insufficient documentation

## 2015-07-25 DIAGNOSIS — H5442 Blindness, left eye, normal vision right eye: Secondary | ICD-10-CM | POA: Insufficient documentation

## 2015-07-25 DIAGNOSIS — Z980 Intestinal bypass and anastomosis status: Secondary | ICD-10-CM | POA: Diagnosis not present

## 2015-07-25 DIAGNOSIS — K649 Unspecified hemorrhoids: Secondary | ICD-10-CM | POA: Diagnosis not present

## 2015-07-25 DIAGNOSIS — K640 First degree hemorrhoids: Secondary | ICD-10-CM | POA: Diagnosis not present

## 2015-07-25 DIAGNOSIS — G4733 Obstructive sleep apnea (adult) (pediatric): Secondary | ICD-10-CM | POA: Insufficient documentation

## 2015-07-25 DIAGNOSIS — K625 Hemorrhage of anus and rectum: Secondary | ICD-10-CM | POA: Diagnosis not present

## 2015-07-25 DIAGNOSIS — R195 Other fecal abnormalities: Secondary | ICD-10-CM | POA: Insufficient documentation

## 2015-07-25 DIAGNOSIS — K2970 Gastritis, unspecified, without bleeding: Secondary | ICD-10-CM | POA: Diagnosis not present

## 2015-07-25 DIAGNOSIS — Z7984 Long term (current) use of oral hypoglycemic drugs: Secondary | ICD-10-CM | POA: Insufficient documentation

## 2015-07-25 DIAGNOSIS — Z87891 Personal history of nicotine dependence: Secondary | ICD-10-CM | POA: Diagnosis not present

## 2015-07-25 DIAGNOSIS — E785 Hyperlipidemia, unspecified: Secondary | ICD-10-CM | POA: Diagnosis not present

## 2015-07-25 DIAGNOSIS — J449 Chronic obstructive pulmonary disease, unspecified: Secondary | ICD-10-CM | POA: Diagnosis not present

## 2015-07-25 DIAGNOSIS — M1990 Unspecified osteoarthritis, unspecified site: Secondary | ICD-10-CM | POA: Diagnosis not present

## 2015-07-25 DIAGNOSIS — I10 Essential (primary) hypertension: Secondary | ICD-10-CM | POA: Diagnosis not present

## 2015-07-25 DIAGNOSIS — K449 Diaphragmatic hernia without obstruction or gangrene: Secondary | ICD-10-CM | POA: Diagnosis not present

## 2015-07-25 DIAGNOSIS — Z9981 Dependence on supplemental oxygen: Secondary | ICD-10-CM | POA: Insufficient documentation

## 2015-07-25 DIAGNOSIS — K5730 Diverticulosis of large intestine without perforation or abscess without bleeding: Secondary | ICD-10-CM | POA: Diagnosis not present

## 2015-07-25 DIAGNOSIS — E119 Type 2 diabetes mellitus without complications: Secondary | ICD-10-CM | POA: Diagnosis not present

## 2015-07-25 DIAGNOSIS — D509 Iron deficiency anemia, unspecified: Secondary | ICD-10-CM | POA: Insufficient documentation

## 2015-07-25 DIAGNOSIS — K317 Polyp of stomach and duodenum: Secondary | ICD-10-CM | POA: Diagnosis not present

## 2015-07-25 DIAGNOSIS — K5790 Diverticulosis of intestine, part unspecified, without perforation or abscess without bleeding: Secondary | ICD-10-CM | POA: Diagnosis not present

## 2015-07-25 DIAGNOSIS — G20 Parkinson's disease: Secondary | ICD-10-CM | POA: Diagnosis not present

## 2015-07-25 DIAGNOSIS — Z9049 Acquired absence of other specified parts of digestive tract: Secondary | ICD-10-CM | POA: Insufficient documentation

## 2015-07-25 DIAGNOSIS — Z791 Long term (current) use of non-steroidal anti-inflammatories (NSAID): Secondary | ICD-10-CM | POA: Insufficient documentation

## 2015-07-25 HISTORY — PX: ESOPHAGOGASTRODUODENOSCOPY (EGD) WITH PROPOFOL: SHX5813

## 2015-07-25 HISTORY — PX: COLONOSCOPY WITH PROPOFOL: SHX5780

## 2015-07-25 LAB — GLUCOSE, CAPILLARY: GLUCOSE-CAPILLARY: 127 mg/dL — AB (ref 65–99)

## 2015-07-25 SURGERY — ESOPHAGOGASTRODUODENOSCOPY (EGD) WITH PROPOFOL
Anesthesia: Monitor Anesthesia Care

## 2015-07-25 MED ORDER — PROPOFOL 10 MG/ML IV BOLUS
INTRAVENOUS | Status: AC
  Filled 2015-07-25: qty 40

## 2015-07-25 MED ORDER — LIDOCAINE HCL (CARDIAC) 20 MG/ML IV SOLN
INTRAVENOUS | Status: AC
  Filled 2015-07-25: qty 5

## 2015-07-25 MED ORDER — LACTATED RINGERS IV SOLN
INTRAVENOUS | Status: DC
  Administered 2015-07-25: 08:00:00 via INTRAVENOUS

## 2015-07-25 MED ORDER — LIDOCAINE HCL (PF) 2 % IJ SOLN
INTRAMUSCULAR | Status: DC | PRN
  Administered 2015-07-25: 20 mg via INTRADERMAL

## 2015-07-25 MED ORDER — PROPOFOL 10 MG/ML IV BOLUS
INTRAVENOUS | Status: DC | PRN
  Administered 2015-07-25 (×8): 50 mg via INTRAVENOUS

## 2015-07-25 MED ORDER — SODIUM CHLORIDE 0.9 % IV SOLN: INTRAVENOUS | Status: DC

## 2015-07-25 MED ORDER — FENTANYL CITRATE (PF) 100 MCG/2ML IJ SOLN: 25.0000 ug | INTRAMUSCULAR | Status: DC | PRN

## 2015-07-25 SURGICAL SUPPLY — 24 items

## 2015-07-25 NOTE — Op Note (Signed)
[facility] Patient Name: James [name] Procedure Date: 07/25/2015 MRN: [id number] Attending MD: [name] , MD Date of Birth: [birth year] CSN: [id number] Age: 70 Admit Type: Outpatient Procedure:                Colonoscopy Indications:              Evaluation of unexplained GI bleeding, Unexplained                            iron deficiency anemia, Personal history of                            adenomatous colon polyps Providers:                Pricilla Riffle. Fuller Plan, MD, Kingsley Plan, RN, Corliss Parish, Technician, Lajuana Carry, CRNA Referring MD:             [name], MD Medicines:                Monitored Anesthesia Care Complications:            No immediate complications. Estimated Blood Loss:     Estimated blood loss: none. Procedure:                Pre-Anesthesia Assessment:                           - Prior to the procedure, a History and Physical                            was performed, and patient medications and                            allergies were reviewed. The patient's tolerance of                            previous anesthesia was also reviewed. The risks                            and benefits of the procedure and the sedation                            options and risks were discussed with the patient.                            All questions were answered, and informed consent                            was obtained. Prior Anticoagulants: The patient has                            taken no previous anticoagulant or antiplatelet  agents. ASA Grade Assessment: III - A patient with                            severe systemic disease. After reviewing the risks                            and benefits, the patient was deemed in                            satisfactory condition to undergo the procedure.                           After obtaining informed consent, the colonoscope           was passed under direct vision. Throughout the                            procedure, the patient's blood pressure, pulse, and                            oxygen saturations were monitored continuously. The                            EC-[id number] ([id number]) scope was introduced through                            the anus and advanced to the the cecum, identified                            by appendiceal orifice and ileocecal valve. The                            colonoscopy was performed without difficulty. The                            patient tolerated the procedure well. The quality                            of the bowel preparation was adequate. The                            ileocecal valve, appendiceal orifice, and rectum                            were photographed. Scope In: 8:52:02 AM Scope Out: 9:05:15 AM Scope Withdrawal Time: 0 hours 8 minutes 17 seconds  Total Procedure Duration: 0 hours 13 minutes 13 seconds  Findings:      The digital rectal exam was normal.      A few medium-mouthed diverticula were found in the descending colon.       There was no evidence of diverticular bleeding.      There was evidence of a prior end-to-end colo-colonic anastomosis in the       sigmoid colon. This was patent and was characterized by healthy       appearing mucosa.  The anastomosis was traversed.      Internal hemorrhoids were found during retroflexion. The hemorrhoids       were small and Grade I (internal hemorrhoids that do not prolapse).      The exam was otherwise normal throughout the examined colon. Impression:               - Mild diverticulosis in the descending colon.                            There was no evidence of diverticular bleeding.                           - Patent end-to-end colo-colonic anastomosis,                            characterized by healthy appearing mucosa.                           - Internal hemorrhoids. Moderate Sedation:      N/A- Per  Anesthesia Care Recommendation:           - Resume previous diet.                           - Continue present medications.                           - Repeat colonoscopy in 5 years for surveillance.                           - Follow up with Dr. [name] for mgmt of anemia Procedure Code(s):        --- Professional ---                           [id number], Colonoscopy, flexible; diagnostic, including                            collection of specimen(s) by brushing or washing,                            when performed (separate procedure) Diagnosis Code(s):        --- Professional ---                           K64.0, First degree hemorrhoids                           Z98.0, Intestinal bypass and anastomosis status                           K92.2, Gastrointestinal hemorrhage, unspecified                           D50.9, Iron deficiency anemia, unspecified                           K57.30, Diverticulosis of large intestine without  perforation or abscess without bleeding CPT copyright 2016 [employer]. All rights reserved. The codes documented in this report are preliminary and upon coder review may  be revised to meet current compliance requirements. [name], MD 07/25/2015 9:11:45 AM This report has been signed electronically. Number of Addenda: 0

## 2015-07-25 NOTE — Anesthesia Postprocedure Evaluation (Signed)
Anesthesia Post Note  Patient: James [name]  Procedure(s) Performed: Procedure(s) (LRB): ESOPHAGOGASTRODUODENOSCOPY (EGD) WITH PROPOFOL (N/A) COLONOSCOPY WITH PROPOFOL (N/A)  Patient location during evaluation: PACU Anesthesia Type: MAC Level of consciousness: awake and alert Pain management: pain level controlled Vital Signs Assessment: post-procedure vital signs reviewed and stable Respiratory status: spontaneous breathing, nonlabored ventilation, respiratory function stable and patient connected to nasal cannula oxygen Cardiovascular status: blood pressure returned to baseline and stable Postop Assessment: no signs of nausea or vomiting Anesthetic complications: no    Last Vitals:  Filed Vitals:   07/25/15 0945 07/25/15 0950  BP:  130/63  Pulse: 62 61  Temp:    Resp: 18 17    Last Pain: There were no vitals filed for this visit.               Jyssica Rief L

## 2015-07-25 NOTE — Interval H&P Note (Signed)
History and Physical Interval Note:  07/25/2015 8:40 AM  James [name] Genre  has presented today for surgery, with the diagnosis of history of polyps heme positive stool/iron def anemia  The various methods of treatment have been discussed with the patient and family. After consideration of risks, benefits and other options for treatment, the patient has consented to  Procedure(s): ESOPHAGOGASTRODUODENOSCOPY (EGD) WITH PROPOFOL (N/A) COLONOSCOPY WITH PROPOFOL (N/A) as a surgical intervention .  The patient's history has been reviewed, patient examined, no change in status, stable for surgery.  I have reviewed the patient's chart and labs.  Questions were answered to the patient's satisfaction.     Pricilla Riffle. Fuller Plan

## 2015-07-25 NOTE — Anesthesia Preprocedure Evaluation (Signed)
Anesthesia Evaluation  Patient identified by MRN, date of birth, ID band Patient awake    Reviewed: Allergy & Precautions, H&P , NPO status , Patient's Chart, lab work & pertinent test results  Airway Mallampati: II  TM Distance: >3 FB Neck ROM: full    Dental no notable dental hx. (+) Dental Advisory Given, Teeth Intact   Pulmonary neg pulmonary ROS, sleep apnea , COPD, former smoker,    Pulmonary exam normal breath sounds clear to auscultation       Cardiovascular Exercise Tolerance: Good hypertension, Pt. on medications negative cardio ROS Normal cardiovascular exam Rhythm:regular Rate:Normal     Neuro/Psych Depression glaucomaGlaucoma. Blind left eye negative neurological ROS  negative psych ROS   GI/Hepatic negative GI ROS, Neg liver ROS,   Endo/Other  negative endocrine ROSdiabetes, Well Controlled, Type 2, Oral Hypoglycemic Agents  Renal/GU negative Renal ROS  negative genitourinary   Musculoskeletal   Abdominal   Peds  Hematology negative hematology ROS (+)   Anesthesia Other Findings   Reproductive/Obstetrics negative OB ROS                             Anesthesia Physical Anesthesia Plan  ASA: III  Anesthesia Plan: MAC   Post-op Pain Management:    Induction:   Airway Management Planned:   Additional Equipment:   Intra-op Plan:   Post-operative Plan:   Informed Consent: I have reviewed the patients History and Physical, chart, labs and discussed the procedure including the risks, benefits and alternatives for the proposed anesthesia with the patient or authorized representative who has indicated his/her understanding and acceptance.   Dental Advisory Given  Plan Discussed with: CRNA  Anesthesia Plan Comments:         Anesthesia Quick Evaluation

## 2015-07-25 NOTE — Op Note (Signed)
[facility] Patient Name: James [name] Procedure Date: 07/25/2015 MRN: [id number] Attending MD: [name] , MD Date of Birth: [birth year] CSN: [id number] Age: 70 Admit Type: Outpatient Procedure:                Upper GI endoscopy Indications:              Iron deficiency anemia, Heme positive stool Providers:                Pricilla Riffle. Fuller Plan, MD, Kingsley Plan, RN, Corliss Parish, Technician, Lajuana Carry, CRNA Referring MD:             [name], MD Medicines:                Monitored Anesthesia Care Complications:            No immediate complications. Estimated Blood Loss:     Estimated blood loss was minimal. Procedure:                Pre-Anesthesia Assessment:                           - Prior to the procedure, a History and Physical                            was performed, and patient medications and                            allergies were reviewed. The patient's tolerance of                            previous anesthesia was also reviewed. The risks                            and benefits of the procedure and the sedation                            options and risks were discussed with the patient.                            All questions were answered, and informed consent                            was obtained. Prior Anticoagulants: The patient has                            taken no previous anticoagulant or antiplatelet                            agents. ASA Grade Assessment: III - A patient with                            severe systemic disease. After reviewing the risks  and benefits, the patient was deemed in                            satisfactory condition to undergo the procedure.                           - Prior to the procedure, a History and Physical                            was performed, and patient medications and                            allergies were reviewed. The patient's  tolerance of                            previous anesthesia was also reviewed. The risks                            and benefits of the procedure and the sedation                            options and risks were discussed with the patient.                            All questions were answered, and informed consent                            was obtained. Prior Anticoagulants: The patient has                            taken no previous anticoagulant or antiplatelet                            agents. ASA Grade Assessment: III - A patient with                            severe systemic disease. After reviewing the risks                            and benefits, the patient was deemed in                            satisfactory condition to undergo the procedure.                           After obtaining informed consent, the endoscope was                            passed under direct vision. Throughout the                            procedure, the patient's blood pressure, pulse, and  oxygen saturations were monitored continuously. The                            [id number] ([id number]) scope was introduced through the                            mouth, and advanced to the second part of duodenum.                            The upper GI endoscopy was accomplished without                            difficulty. The patient tolerated the procedure                            well. Scope In: Scope Out: Findings:      Localized mild inflammation characterized by erythema and granularity       was found in the gastric body. Biopsies were taken with a cold forceps       for histology.      A few 3 to 5 mm sessile polyps with no bleeding and no stigmata of       recent bleeding were found in the gastric body. Biopsies were taken with       a cold forceps for histology.      A small hiatal hernia was present.      The exam of the stomach was otherwise normal.      The examined  esophagus was normal.      The duodenal bulb and second portion of the duodenum were normal.       Biopsies for histology were taken with a cold forceps for evaluation of       celiac disease. Impression:               - Gastritis. Biopsied.                           - A few gastric polyps. Biopsied.                           - Small hiatal hernia.                           - Normal esophagus.                           - Normal duodenal bulb and second portion of the                            duodenum. Biopsied. Moderate Sedation:      N/A- Per Anesthesia Care Recommendation:           - Patient has a contact number available for                            emergencies. The signs and symptoms of potential  delayed complications were discussed with the                            patient. Return to normal activities tomorrow.                            Written discharge instructions were provided to the                            patient.                           - Resume previous diet.                           - Continue present medications.                           - Await pathology results.                           - To visualize the small bowel, perform video                            capsule endoscopy at the next available appointment. Procedure Code(s):        --- Professional ---                           [phone], Esophagogastroduodenoscopy, flexible,                            transoral; with biopsy, single or multiple Diagnosis Code(s):        --- Professional ---                           K29.70, Gastritis, unspecified, without bleeding                           K31.7, Polyp of stomach and duodenum                           K44.9, Diaphragmatic hernia without obstruction or                            gangrene                           D50.9, Iron deficiency anemia, unspecified                           R19.5, Other fecal abnormalities CPT  copyright 2016 American Medical Association. All rights reserved. The codes documented in this report are preliminary and upon coder review may  be revised to meet current compliance requirements. [name], MD 07/25/2015 9:25:55 AM This report has been signed electronically. Number of Addenda: 0

## 2015-07-25 NOTE — Transfer of Care (Signed)
Immediate Anesthesia Transfer of Care Note  Patient: James [name]  Procedure(s) Performed: Procedure(s): ESOPHAGOGASTRODUODENOSCOPY (EGD) WITH PROPOFOL (N/A) COLONOSCOPY WITH PROPOFOL (N/A)  Patient Location: PACU  Anesthesia Type:MAC  Level of Consciousness:  sedated, patient cooperative and responds to stimulation  Airway & Oxygen Therapy:Patient Spontanous Breathing   Post-op Assessment:  Report given to PACU RN and Post -op Vital signs reviewed and stable  Post vital signs:  Reviewed and stable  Last Vitals:  Filed Vitals:   07/25/15 0759  BP: 142/62  Pulse: 67  Temp: 36.8 C  Resp: 12    Complications: No apparent anesthesia complications

## 2015-07-25 NOTE — Discharge Instructions (Signed)
Colonoscopy, Care After

## 2015-07-25 NOTE — H&P (View-Only) (Signed)
PCP is Horatio Pel, MD Referring Provider is Chesley Mires, MD  Chief Complaint  Patient presents with  . Lung Lesion    CT CHEST 05/28/15, PET 06/14/15, PFT 06/26/15    HPI: James [name] is sent for consultation regarding a right lower lobe lung nodule.  James [name] is a 70 year old man with a history of tobacco abuse(2 packs per day for 50 years), COPD, pulmonary nodules, sleep apnea, type 2 diabetes, depression, hypertension, osteoarthritis, glaucoma, and parkinsonism. James [name] has been followed with chest CTs by Dr. Halford Chessman. In December 2016 there was a new 15 mm x 7 mm right lower lobe nodule. A repeat scan in April showed more of an L-shaped appearance to this nodule and it measured 20 mm in greatest dimension. 3 weeks later a PET/CT was done. The nodule is mildly hypermetabolic with an SUV of 1.9. Of note the nodule was considerably smaller at 13 mm and no longer had an L shaped appearance.  James [name] has 100-pack-year history of smoking before quitting a year and a half ago. James [name] gets short of breath with exertion such as walking up 2 flights of stairs. James [name] also experiences chest tightness along with the shortness of breath, but says James [name] saw a cardiologist a year ago and there were no cardiac issues. James [name] does have a frequent productive cough and often has wheezing. James [name] uses home oxygen at night, but does not use routinely use it during the day. James [name] has lost 4 pounds over the past 3 months when she had previous to diet. James [name] was recently diagnosed with type 2 diabetes and started on metformin. James [name] also was recently seen by neurology and started on Sinemet for tremors.  Zubrod Score: At the time of surgery this patient's most appropriate activity status/level should be described as: '[]'$     0    Normal activity, no symptoms '[x]'$     1    Restricted in physical strenuous activity but ambulatory, able to do out light work '[]'$     2    Ambulatory and capable of self care, unable to do work activities, up and about >50 % of  waking hours                              '[]'$     3    Only limited self care, in bed greater than 50% of waking hours '[]'$     4    Completely disabled, no self care, confined to bed or chair '[]'$     5    Moribund    Past Medical History  Diagnosis Date  . Allergy     seasonal  . Blood transfusion     2002  . COPD (chronic obstructive pulmonary disease) ([place])   . Depression   . Hyperlipidemia   . Blind left eye     legally  . Tubular adenoma of colon 03/2011  . Diverticulosis   . Internal and external bleeding hemorrhoids   . Osteoarthritis   . Hypertension   . Insomnia   . Iron deficiency anemia   . Rosacea   . ED (erectile dysfunction)   . Glaucoma   . Tremor of both hands   . OSA (obstructive sleep apnea)   . Diabetes mellitus, type 2 [facility])     Past Surgical History  Procedure Laterality Date  . Partial colectomy  2008  . Clavicle surgery  [place]    right  .  Knee arthroscopy  1996  . Cholecystectomy    . Tonsillectomy    . Thyroid cyst excision    . Arthroscopic repair acl      Family History  Problem Relation Age of Onset  . CAD Father   . Alcohol abuse Father   . Heart disease Father   . COPD Mother   . Asthma Mother   . Emphysema Mother   . Cancer Maternal Grandmother     Social History Social History  Substance Use Topics  . Smoking status: Former Smoker -- 1.50 packs/day for 50 years    Types: Cigarettes    Quit date: 04/29/2013  . Smokeless tobacco: Never Used  . Alcohol Use: 0.0 oz/week    0 Standard drinks or equivalent per week     Comment: rarely beer    Current Outpatient Prescriptions  Medication Sig Dispense Refill  . albuterol (PROVENTIL) (2.5 MG/3ML) 0.083% nebulizer solution Take 3 mLs (2.5 mg total) by nebulization every 6 (six) hours as needed for wheezing or shortness of breath. 120 vial 5  . albuterol (VENTOLIN HFA) 108 (90 BASE) MCG/ACT inhaler Inhale 2 puffs into the lungs every 6 (six) hours as needed for wheezing or  shortness of breath. 1 Inhaler 5  . atorvastatin (LIPITOR) 20 MG tablet Take 20 mg by mouth daily.     Marland Kitchen buPROPion (WELLBUTRIN XL) 300 MG 24 hr tablet Take 300 mg by mouth daily.     . calcium-vitamin D (OSCAL WITH D) 250-125 MG-UNIT per tablet Take 1 tablet by mouth daily.    . carbidopa-levodopa (SINEMET IR) 25-100 MG tablet Take 1/2 pill twice daily x 1 week, then 1/2 pill 3 times a day thereafter. 45 tablet 3  . CINNAMON PO Take 500 mg by mouth daily.     Nyoka Cowden Tea 150 MG CAPS Take by mouth daily.    . hydrochlorothiazide (HYDRODIURIL) 25 MG tablet Take 25 mg by mouth daily.     Marland Kitchen ibuprofen (ADVIL,MOTRIN) 200 MG tablet Take 600 mg by mouth.    . metFORMIN (GLUCOPHAGE) 500 MG tablet Take by mouth 2 (two) times daily with a meal.    . metroNIDAZOLE (METROCREAM) 0.75 % cream Apply topically as needed.    . Multiple Vitamins-Minerals (MULTIVITAMIN WITH MINERALS) tablet Take 1 tablet by mouth daily.      . OMEGA-3 1000 MG CAPS Take 1 g by mouth daily.    . ranitidine (ZANTAC) 75 MG tablet 75 mg. Take 75 mg by mouth every morning.    . sertraline (ZOLOFT) 100 MG tablet Take 100 mg by mouth daily.     . vardenafil (LEVITRA) 20 MG tablet Take 20 mg by mouth daily.     No current facility-administered medications for this visit.    Allergies  Allergen Reactions  . Codeine Itching  . Morphine Itching    Review of Systems  Constitutional: Positive for fatigue. Negative for fever, chills, activity change and unexpected weight change (has lost 4 pounds in 3 months with diet).  HENT: Positive for congestion and dental problem (dentures). Negative for trouble swallowing and voice change.   Eyes: Positive for visual disturbance (blurry).  Respiratory: Positive for apnea, chest tightness, shortness of breath and wheezing.        Uses home O2 at night  Cardiovascular: Negative for palpitations and leg swelling.  Gastrointestinal: Positive for abdominal pain (reflux) and diarrhea.   Genitourinary: Positive for frequency. Negative for dysuria.  Musculoskeletal: Positive for myalgias and arthralgias.  Neurological: Positive for dizziness, tremors and numbness. Negative for weakness.       Memory problems  Hematological: Negative for adenopathy. Does not bruise/bleed easily.  All other systems reviewed and are negative.   BP 148/82 mmHg  Pulse 89  Resp 18  Ht '6\' 1"'$  (1.854 m)  Wt 246 lb (111.585 kg)  BMI 32.46 kg/m2  SpO2 93% Physical Exam  Constitutional: James [name] is oriented to person, place, and time. James [name] appears well-developed and well-nourished. No distress.  HENT:  Head: Normocephalic and atraumatic.  Eyes: Conjunctivae and EOM are normal. No scleral icterus.  Neck: Neck supple. No thyromegaly present.  Cardiovascular: Normal rate and regular rhythm.  Exam reveals no gallop and no friction rub.   No murmur heard. Pulmonary/Chest: Effort normal. No respiratory distress. James [name] has no wheezes. James [name] has no rales.  Diminished BS bilaterally  Abdominal: Soft. James [name] exhibits no distension. There is no tenderness.  Musculoskeletal: Normal range of motion. James [name] exhibits no edema.  Lymphadenopathy:    James [name] has no cervical adenopathy.  Neurological: James [name] is alert and oriented to person, place, and time. No cranial nerve deficit.  + tremor left hand. No focal motor weakness  Vitals reviewed.    Diagnostic Tests: CT CHEST WITHOUT CONTRAST  TECHNIQUE: Multidetector CT imaging of the chest was performed following the standard protocol without IV contrast.  COMPARISON: 02/13/2015 chest CT.  FINDINGS: Mediastinum/Nodes: Normal heart size. No pericardial fluid/thickening. Left main, left anterior descending and right coronary atherosclerosis . Great vessels are normal in course and caliber. Stable hypodense 1.3 cm left thyroid lobe nodule. Normal esophagus. No pathologically enlarged axillary, mediastinal or gross hilar lymph nodes, noting limited sensitivity for the  detection of hilar adenopathy on this noncontrast study.  Lungs/Pleura: No pneumothorax. No pleural effusion. Moderate to severe centrilobular and paraseptal emphysema with diffuse bronchial wall thickening. Irregular 2.0 x 1.7 cm posterior right lower lobe pulmonary nodule (series 3/ image 122), previously 1.5 x 0.7 cm, increased. Separate superior segment right lower lobe 3 mm pulmonary nodule (series 3/ image 93) is stable since 03/23/2013 and benign. Posterior left upper lobe 3 mm solid pulmonary nodule (series 3/ image 102) is stable since 03/23/2013 and benign. Left lower lobe 4 mm solid pulmonary nodule (series 3/ image 102) is stable since 03/15/2013 and benign. No acute consolidative airspace disease or new significant pulmonary nodules. There is no appreciable change in nonspecific subpleural reticulation at the dependent left greater than right lung bases, which could represent hypoventilatory change.  Upper abdomen: The appearance of the visualized adrenal glands is stable since 03/23/2013, with mild irregular thickening of both adrenal glands, without discrete adrenal nodule. Cholecystectomy.  Musculoskeletal: No aggressive appearing focal osseous lesions. Mild degenerative changes in the thoracic spine. Stable superficial subcutaneous 1.5 cm cystic lesion in the posterior lower midline back, most suggestive of a sebaceous cyst.  IMPRESSION: 1. Interval growth of irregular posterior right lower lobe pulmonary nodule, now 2.0 x 1.7 cm, highly suspicious for a primary bronchogenic carcinoma. Recommend thoracic surgical consultation and PET-CT for further staging. 2. No noncontrast CT evidence of thoracic adenopathy or metastatic disease. 3. Additional findings include coronary atherosclerosis and moderate to severe emphysema with diffuse bronchial wall thickening.  NUCLEAR MEDICINE PET SKULL BASE TO THIGH  TECHNIQUE: 12.5 mCi F-18 FDG was injected  intravenously. Full-ring PET imaging was performed from the skull base to thigh after the radiotracer. CT data was obtained and used for attenuation correction and anatomic localization.  FASTING BLOOD GLUCOSE: Value: 162  mg/dl  COMPARISON: 02/13/2015  FINDINGS: NECK  No hypermetabolic lymph nodes in the neck.  CHEST  No hypermetabolic mediastinal or hilar nodes. Aortic atherosclerosis noted. There is also LAD coronary artery calcification. The irregular nodule in the posterior right lower lobe measures 11 mm and has an SUV max equal to 1.93. There are moderate changes of centrilobular and paraseptal emphysema.  ABDOMEN/PELVIS  No abnormal hypermetabolic activity within the liver, pancreas, adrenal glands, or spleen. Left renal calculi identified. Partially collapsed exophytic cyst arising from mid left kidney measuring 3.7 cm. Aortic atherosclerosis noted. No hypermetabolic lymph nodes in the abdomen or pelvis.  SKELETON  No focal hypermetabolic activity to suggest skeletal metastasis.  IMPRESSION: 1. Low level, malignant range FDG uptake is associated with the irregular nodule in the posterior right lower lobe. The SUV max is equal to 1.93. Early pulmonary neoplasm cannot be excluded. 2. No evidence for hypermetabolic hilar or mediastinal lymph nodes or distant metastatic disease. 3. Aortic atherosclerosis and coronary artery calcification 4. Emphysema. 5. Nephrolithiasis.   Electronically Signed  By: Kerby Moors M.D.  On: 06/14/2015 11:59  Electronically Signed  By: Ilona Sorrel M.D.  On: 05/28/2015 13:26  FVC= 3.24(65%), POST= 3.85(73%) FEV1= 1.79(48%), POST= 2.19(59%) DLCO= 46% predicted  I personally reviewed the PET/CT and CT scans from 05/28/2015 and 02/23/2015 and concur with the findings as noted above.   Impression: 70 year old male with history of heavy tobacco abuse and multiple medical issues who has a right lower lobe  lung nodule. On CT James [name] has multiple nodules majority of which are small and stable. There was a new nodule on the CT in December. This nodule was fairly linear and measured 7 x 15 mm. On an interval CT in April 3 months later, the nodule had an L-shaped appearance and measured 20 mm in greatest dimension. A PET CT about 2-1/2 weeks later showed the nodule to be smaller at 13 mm and the "L" component had resolved. Nodule was mildly hypermetabolic with an SUV of 5.09.  Although I cannot rule out the possibility that this is a malignancy, the rapid increase and decrease in size is not consistent with the behavior of a malignancy. I suspect what was accounting for the increase in size on the CT was some adjacent atelectasis that resolved. I do not think there is a strong indication for surgery at this point particularly given his increased risk for perioperative morbidity. I would not completely rule him out a surgical candidate, but would not recommend it in this setting as the nodule has gotten smaller over time. This could be an infectious or inflammatory nodule. It also could be a low-grade malignancy, but if that is the case there is a little downside to waiting for more definitive change to be evident radiographically.  I reviewed the CT scans and PET/CT with James [name]. We discussed these issues at length. My recommendation was continued radiographic follow-up. James [name] is in agreement with that recommendation. I will defer to Dr. Halford Chessman for the radiographic follow-up, but will be happy to see James [name] back at any time if I can be of any further assistance with his care.  Plan: Recommend a CT scan in 3-4 months  I spent 30 minutes with James [name] during this visit, > 50% of the time was spent in counseling Melrose Nakayama, MD [facility] [phone]

## 2015-07-26 ENCOUNTER — Encounter: Payer: Self-pay | Admitting: Gastroenterology

## 2015-07-27 ENCOUNTER — Telehealth: Payer: Self-pay

## 2015-07-27 DIAGNOSIS — D509 Iron deficiency anemia, unspecified: Secondary | ICD-10-CM

## 2015-07-27 NOTE — Telephone Encounter (Signed)
Patient contacted to scheduled capsule endo.  I mailed him instructions He is scheduled for 08/08/15 8:00

## 2015-08-06 DIAGNOSIS — Z125 Encounter for screening for malignant neoplasm of prostate: Secondary | ICD-10-CM | POA: Diagnosis not present

## 2015-08-06 DIAGNOSIS — E1165 Type 2 diabetes mellitus with hyperglycemia: Secondary | ICD-10-CM | POA: Diagnosis not present

## 2015-08-06 DIAGNOSIS — E119 Type 2 diabetes mellitus without complications: Secondary | ICD-10-CM | POA: Diagnosis not present

## 2015-08-08 ENCOUNTER — Ambulatory Visit (INDEPENDENT_AMBULATORY_CARE_PROVIDER_SITE_OTHER): Payer: 59 | Admitting: Gastroenterology

## 2015-08-08 DIAGNOSIS — D509 Iron deficiency anemia, unspecified: Secondary | ICD-10-CM

## 2015-08-08 NOTE — Progress Notes (Signed)
Pt tolerated procedure well.  Verbalized all written and verbal instructions.   [id number]   Lot [id number] 09/23/2016 EXP

## 2015-08-10 DIAGNOSIS — M810 Age-related osteoporosis without current pathological fracture: Secondary | ICD-10-CM | POA: Diagnosis not present

## 2015-08-15 ENCOUNTER — Telehealth: Payer: Self-pay

## 2015-08-15 NOTE — Telephone Encounter (Signed)
The pt states he believes he may have passed the capsule but is not sure, he will wait over the weekend and call back on Monday to set up xray if he has not seen the capsule.

## 2015-08-15 NOTE — Telephone Encounter (Signed)
-----   Message from Barron Alvine, RN sent at 08/08/2015  4:02 PM EDT ----- Order Rhunette Croft ray if capsule not passed

## 2015-08-16 DIAGNOSIS — L570 Actinic keratosis: Secondary | ICD-10-CM | POA: Diagnosis not present

## 2015-08-20 ENCOUNTER — Encounter: Payer: Self-pay | Admitting: Gastroenterology

## 2015-08-20 ENCOUNTER — Telehealth: Payer: Self-pay

## 2015-08-20 DIAGNOSIS — T189XXA Foreign body of alimentary tract, part unspecified, initial encounter: Secondary | ICD-10-CM

## 2015-08-20 NOTE — Telephone Encounter (Signed)
-----   Message from Barron Alvine, RN sent at 08/16/2015  3:50 PM EDT ----- Call and see if pt passed capsule

## 2015-08-20 NOTE — Telephone Encounter (Signed)
The pt was also notified that the capsule showed several AVM's nothing further needed per Dr Fuller Plan

## 2015-08-20 NOTE — Telephone Encounter (Signed)
Left message on machine to call back  

## 2015-08-21 ENCOUNTER — Ambulatory Visit (INDEPENDENT_AMBULATORY_CARE_PROVIDER_SITE_OTHER)
Admission: RE | Admit: 2015-08-21 | Discharge: 2015-08-21 | Disposition: A | Discharge status: Home / Self Care | Payer: 59 | Source: Ambulatory Visit | Attending: Physician Assistant | Admitting: Physician Assistant

## 2015-08-21 DIAGNOSIS — T189XXA Foreign body of alimentary tract, part unspecified, initial encounter: Secondary | ICD-10-CM

## 2015-08-21 IMAGING — DX DG ABDOMEN 2V
2 series · 2 of 2 positions shown · non-contrast
Comparison: CT of the abdomen and pelvis [DATE]

CLINICAL DATA: Foreign body in the alimentary tract. Initial
encounter. Endoscopy capsule.

EXAM:
ABDOMEN - 2 VIEW

[abdomen erect]
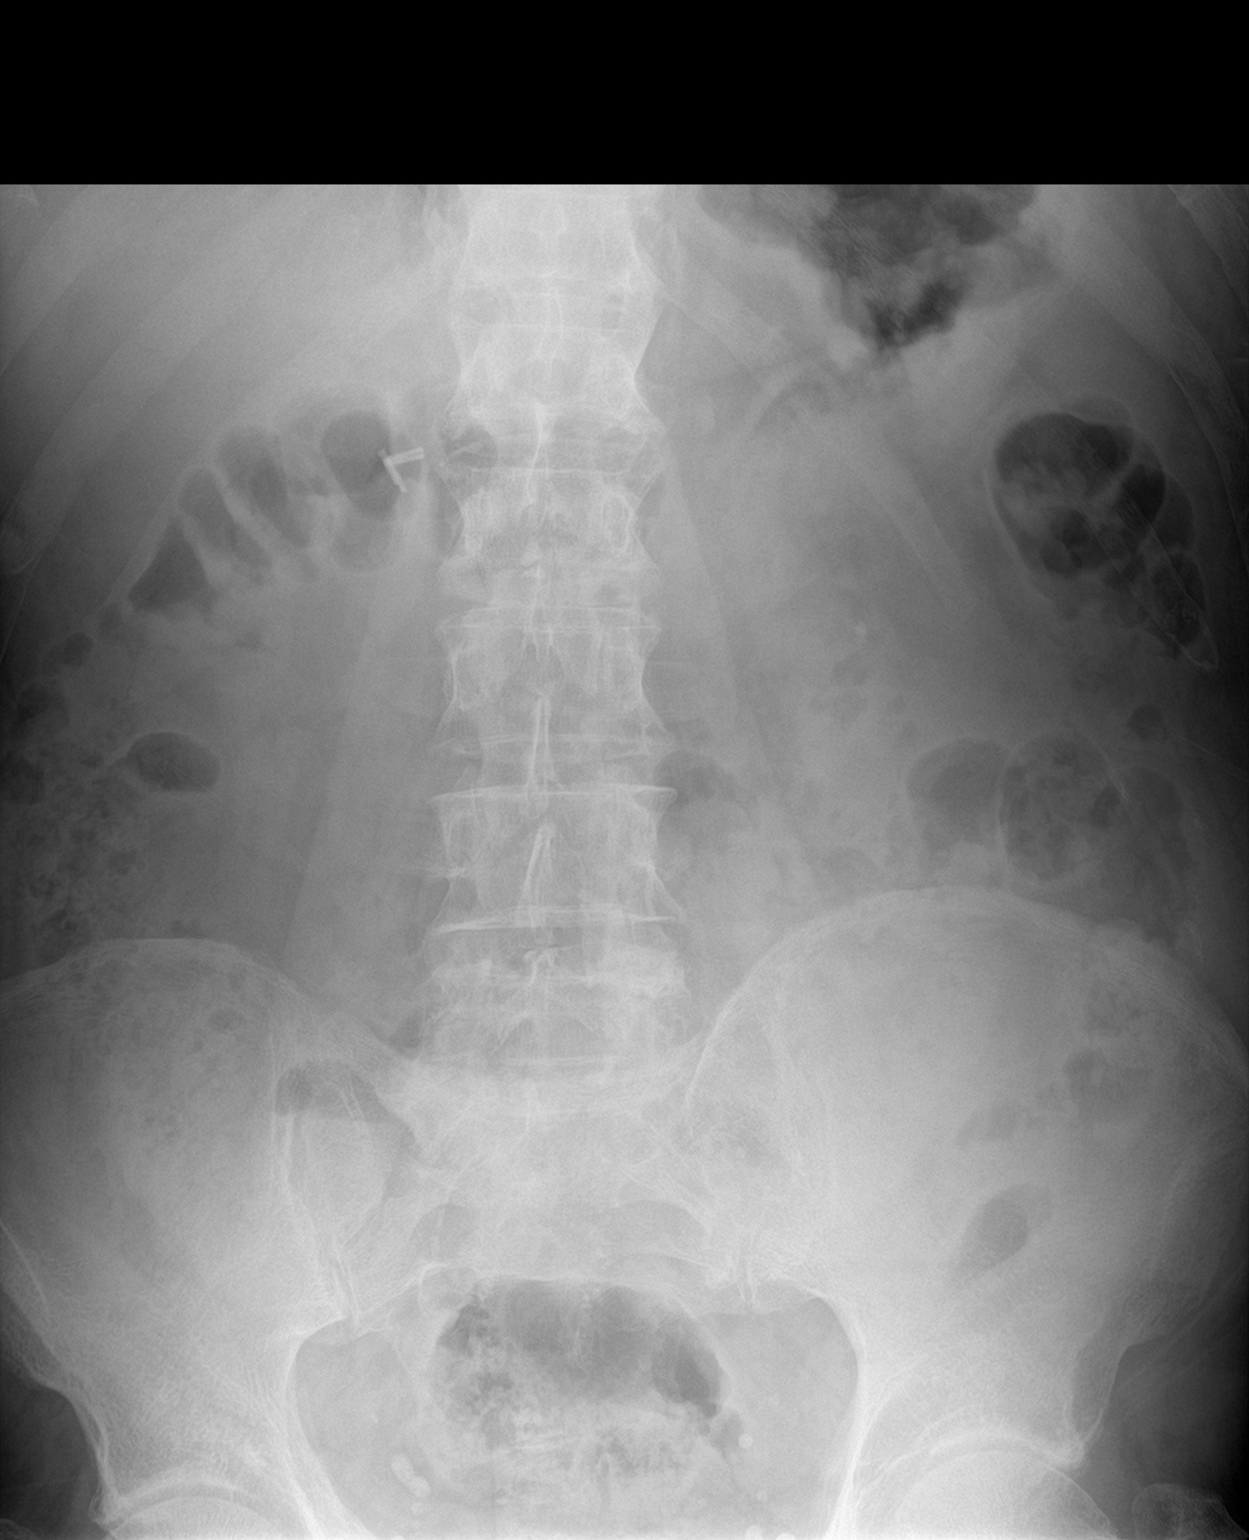

[abdomen supine]
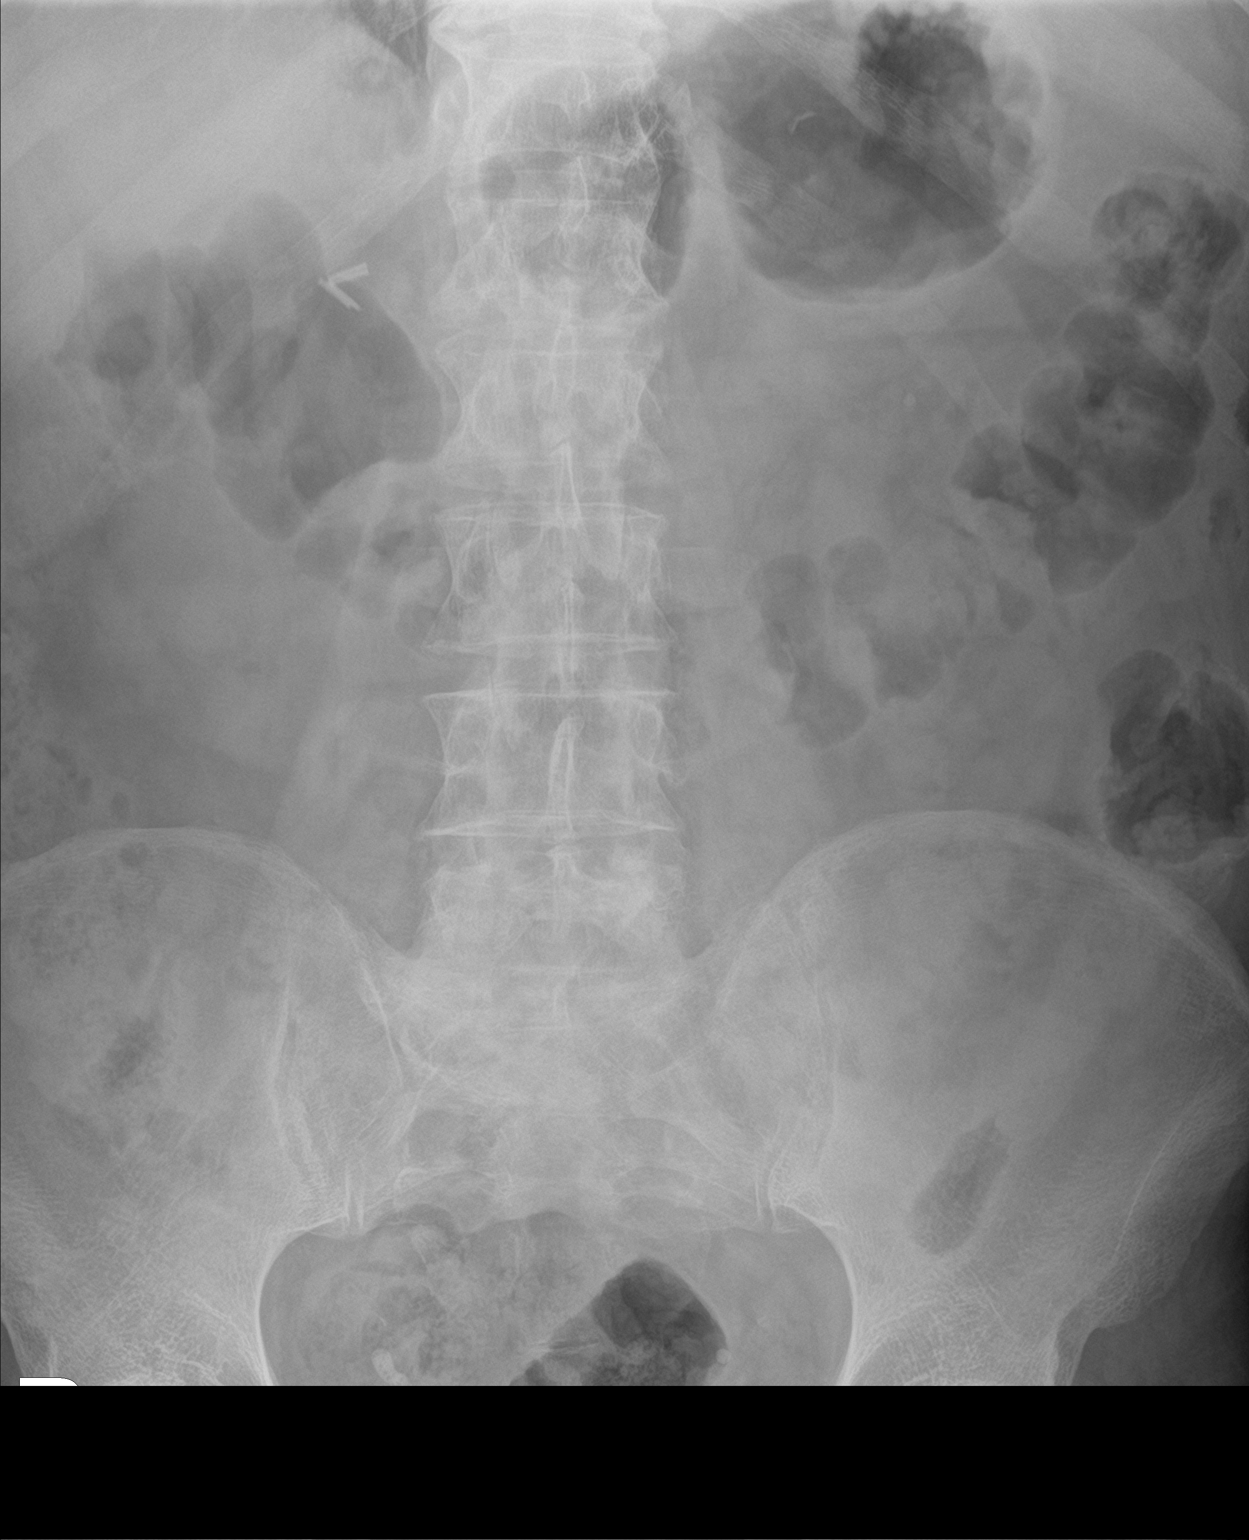

[2 of 2 positions shown; findings below may reference images not displayed]

FINDINGS: Gas and stool are present throughout the colon. Surgical clips are
present at the gallbladder fossa. Multiple pelvic phleboliths are
noted. No radiopaque capsule is evident. There is no free air or
obstruction. Degenerative changes are present at the lower lumbar
spine.
IMPRESSION: No radio-opaque capsule evident within the bowel.

Normal bowel gas pattern.

## 2015-09-10 DIAGNOSIS — D500 Iron deficiency anemia secondary to blood loss (chronic): Secondary | ICD-10-CM | POA: Diagnosis not present

## 2015-09-10 DIAGNOSIS — E119 Type 2 diabetes mellitus without complications: Secondary | ICD-10-CM | POA: Diagnosis not present

## 2015-09-19 ENCOUNTER — Other Ambulatory Visit: Payer: Self-pay | Admitting: Neurology

## 2015-09-19 DIAGNOSIS — G20 Parkinson's disease: Secondary | ICD-10-CM

## 2015-09-20 ENCOUNTER — Encounter: Payer: Self-pay | Admitting: Neurology

## 2015-09-20 ENCOUNTER — Ambulatory Visit (INDEPENDENT_AMBULATORY_CARE_PROVIDER_SITE_OTHER): Payer: 59 | Admitting: Neurology

## 2015-09-20 VITALS — BP 146/72 | HR 86 | Resp 16 | Ht 73.0 in | Wt 225.0 lb

## 2015-09-20 DIAGNOSIS — R413 Other amnesia: Secondary | ICD-10-CM

## 2015-09-20 DIAGNOSIS — G20 Parkinson's disease: Secondary | ICD-10-CM | POA: Diagnosis not present

## 2015-09-20 MED ORDER — CARBIDOPA-LEVODOPA 25-100 MG PO TABS: 0.5000 | ORAL_TABLET | Freq: Three times a day (TID) | ORAL | 6 refills | Status: DC
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
## 2015-09-20 NOTE — Patient Instructions (Addendum)
We will continue with your low dose levodopa.  Stay well hydrated with water, exercise in the form of walking.  Congratulations on your weight loss.  We will monitor your memory.

## 2015-10-01 DIAGNOSIS — H2513 Age-related nuclear cataract, bilateral: Secondary | ICD-10-CM | POA: Diagnosis not present

## 2015-10-01 DIAGNOSIS — H5442 Blindness, left eye, normal vision right eye: Secondary | ICD-10-CM | POA: Diagnosis not present

## 2015-10-01 DIAGNOSIS — H401133 Primary open-angle glaucoma, bilateral, severe stage: Secondary | ICD-10-CM | POA: Diagnosis not present

## 2015-11-08 ENCOUNTER — Encounter: Payer: Self-pay | Admitting: Pulmonary Disease

## 2015-11-08 ENCOUNTER — Ambulatory Visit (INDEPENDENT_AMBULATORY_CARE_PROVIDER_SITE_OTHER): Payer: 59 | Admitting: Pulmonary Disease

## 2015-11-08 VITALS — BP 158/80 | HR 72 | Ht 73.0 in | Wt 218.6 lb

## 2015-11-08 DIAGNOSIS — J432 Centrilobular emphysema: Secondary | ICD-10-CM | POA: Diagnosis not present

## 2015-11-08 DIAGNOSIS — R05 Cough: Secondary | ICD-10-CM

## 2015-11-08 DIAGNOSIS — R058 Other specified cough: Secondary | ICD-10-CM

## 2015-11-08 DIAGNOSIS — J9611 Chronic respiratory failure with hypoxia: Secondary | ICD-10-CM | POA: Diagnosis not present

## 2015-11-08 DIAGNOSIS — R918 Other nonspecific abnormal finding of lung field: Secondary | ICD-10-CM

## 2015-11-08 DIAGNOSIS — J984 Other disorders of lung: Secondary | ICD-10-CM

## 2015-11-08 NOTE — Progress Notes (Signed)
Current Outpatient Prescriptions on File Prior to Visit  Medication Sig  . atorvastatin (LIPITOR) 20 MG tablet Take 20 mg by mouth at bedtime.   Marland Kitchen buPROPion (WELLBUTRIN XL) 300 MG 24 hr tablet Take 300 mg by mouth daily.   . calcium-vitamin D (OSCAL WITH D) 250-125 MG-UNIT per tablet Take 1 tablet by mouth daily.  . carbidopa-levodopa (SINEMET IR) 25-100 MG tablet Take 0.5 tablets by mouth 3 (three) times daily.  . cetirizine (ZYRTEC) 10 MG tablet Take 10 mg by mouth daily.  . ferrous sulfate 325 (65 FE) MG tablet Take 325 mg by mouth daily with supper.  Nyoka Cowden Tea 315 MG CAPS Take 315 mg by mouth daily.  . hydrochlorothiazide (HYDRODIURIL) 25 MG tablet Take 25 mg by mouth daily.   Marland Kitchen MEGARED OMEGA-3 KRILL OIL PO Take 1 capsule by mouth daily.  . metFORMIN (GLUCOPHAGE) 500 MG tablet Take 1,000 mg by mouth 2 (two) times daily with a meal.   . Multiple Vitamins-Minerals (MULTIVITAMIN WITH MINERALS) tablet Take 1 tablet by mouth daily.    Marland Kitchen omeprazole (PRILOSEC OTC) 20 MG tablet Take 20 mg by mouth daily with breakfast.  . sertraline (ZOLOFT) 100 MG tablet Take 100 mg by mouth daily.   . vardenafil (LEVITRA) 20 MG tablet Take 20 mg by mouth daily as needed for erectile dysfunction.    No current facility-administered medications on file prior to visit.      Chief Complaint  Patient presents with  . Follow-up    Pt states that he has been having some increased sinus issues - sneezing, congestion, runny nose. Pt denies any discolored mucus.      Tests PSG 01/25/08 >> RDI 6.8, SpO2 low 84%. Spent 79% of test time with SpO2 < 90% Spirometry 06/08/12 >> FEV1 1.91 (49%), FEV1% 59 CXR 06/08/12 >> changes of emphysema ONO with RA 06/16/12 >> Test time 9 hrs 21 min. Mean SpO2 89%, low SpO2 63%. Spent 2 hrs 25 min with SpO2 < 88% 07/07/12 >> SpO2 86% on room air with exertion >> start 2 liters oxygen with exertion and sleep A1AT 07/07/12 >> 154, PI-MM CT chest 03/23/13 >> mild/diffuse  centrilobular emphysema, 4 mm nodule RLL superior segment, 4 mm nodule LLL CT chest 02/20/14 >> moderate centrilobular/paraseptal emphysema, mild diffuse bronchial wall thickening, 3 mm nodule RLL and 5 mm nodule LLL no change CT chest 02/13/15 >> new 1.5 cm nodule RLL. CT chest 05/28/15 >> increased RLL irregular nodule to 2 cm PET scan 06/14/15 >> 1.93 SUV RLL 11 mm lesion  Past medical hx Glaucoma, Depression, HLD, HTN, DM  Past surgical hx, Allergies, Family hx, Social hx all reviewed.  Vital Signs BP (!) 158/80 (BP Location: Left Arm, Cuff Size: Normal)   Pulse 72   Ht '6\' 1"'$  (1.854 m)   Wt 218 lb 9.6 oz (99.2 kg)   SpO2 93%   BMI 28.84 kg/m   History of Present Illness James [name] is a 70 y.o. male former smoker with COPD/emphysema, chronic hypoxic respiratory failure on supplemental oxygen at night and exertion, and pulmonary nodule.  He has more sinus congestion over the past few days >> gets this every year during September.  He is using nasal irrigation and OTC antihistamine.  Has some cough and chest congestion >> not bad.  Denies chest pain, fever, gland swelling.  Physical Exam  General - No distress ENT - No sinus tenderness, no oral exudate, no LAN Cardiac - s1s2 regular, no murmur  Chest - No wheeze/rales/dullness Back - No focal tenderness Abd - Soft, non-tender Ext - No edema Neuro - Normal strength Skin - No rashes Psych - normal mood, and behavior   Assessment/Plan  COPD with emphysema. - Didn't feel benefit from LAMA, LABA, ICS. - continue prn albuterol via HFA or nebulizer when at home  Chronic respiratory failure with nocturnal hypoxemia from COPD. - continue 2 to 3 liters oxygen with exertion and sleep  Right lower lung mass. - repeat CT chest w/o contrast and call him with results  Upper airway cough syndrome from rhinitis and post-nasal drip. - he can try OTC nasal steroid if symptoms get worse   Patient Instructions  Will  arrange for CT scan without contrast to follow up lung nodule >> will call with results  Follow up in 3 months   Chesley Mires, MD [facility] Pager:  [phone] 11/08/2015, 12:38 PM

## 2015-11-08 NOTE — Patient Instructions (Signed)
Will arrange for CT scan without contrast to follow up lung nodule >> will call with results  Follow up in 3 months

## 2015-11-08 NOTE — Addendum Note (Signed)
Addended by: Virl Cagey on: 11/08/2015 12:42 PM   Modules accepted: Orders

## 2015-11-13 ENCOUNTER — Ambulatory Visit (INDEPENDENT_AMBULATORY_CARE_PROVIDER_SITE_OTHER)
Admission: RE | Admit: 2015-11-13 | Discharge: 2015-11-13 | Disposition: A | Discharge status: Home / Self Care | Payer: 59 | Source: Ambulatory Visit | Attending: Adult Health | Admitting: Adult Health

## 2015-11-13 DIAGNOSIS — R918 Other nonspecific abnormal finding of lung field: Secondary | ICD-10-CM

## 2015-11-13 DIAGNOSIS — J432 Centrilobular emphysema: Secondary | ICD-10-CM | POA: Diagnosis not present

## 2015-11-13 DIAGNOSIS — J984 Other disorders of lung: Secondary | ICD-10-CM

## 2015-11-13 DIAGNOSIS — R05 Cough: Secondary | ICD-10-CM

## 2015-11-13 DIAGNOSIS — R058 Other specified cough: Secondary | ICD-10-CM

## 2015-11-13 DIAGNOSIS — J9611 Chronic respiratory failure with hypoxia: Secondary | ICD-10-CM

## 2015-11-13 IMAGING — CT CT CHEST W/O CM
2 of 3 series · 15 of 36 positions shown, 18 images · non-contrast
Comparison: Chest CT [DATE] and [DATE].  PET-CT [DATE]

CLINICAL DATA: Four month followup of pulmonary nodule.

EXAM:
CT CHEST WITHOUT CONTRAST
TECHNIQUE: Multidetector CT imaging of the chest was performed following the
standard protocol without IV contrast.

[Series 2: thorax · axial · 0.80mm/px · z∈[-372,-58]mm · 12 of 185 slices shown, 15 images]
[im 14/185  mediastinal]
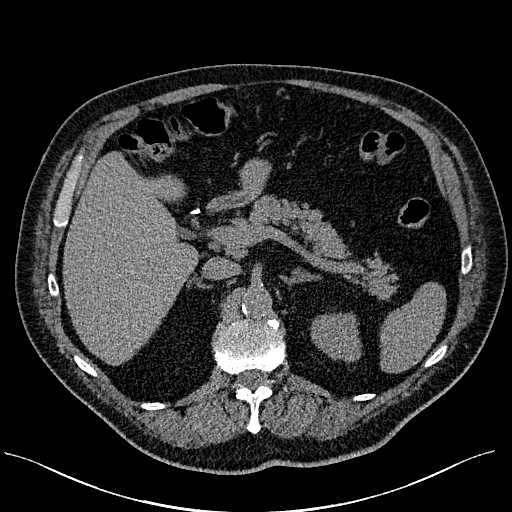
[im 14/185  lung]
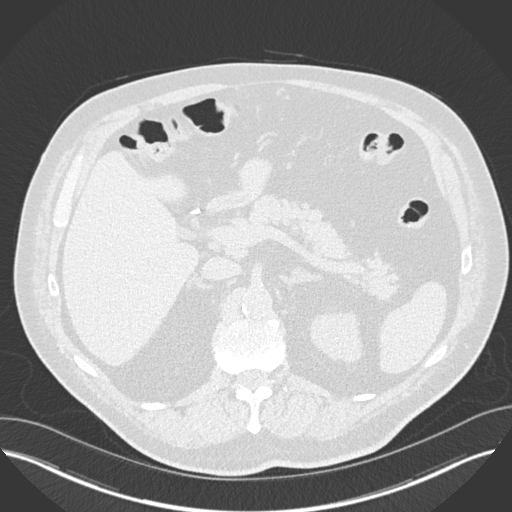
[im 28/185  lung]
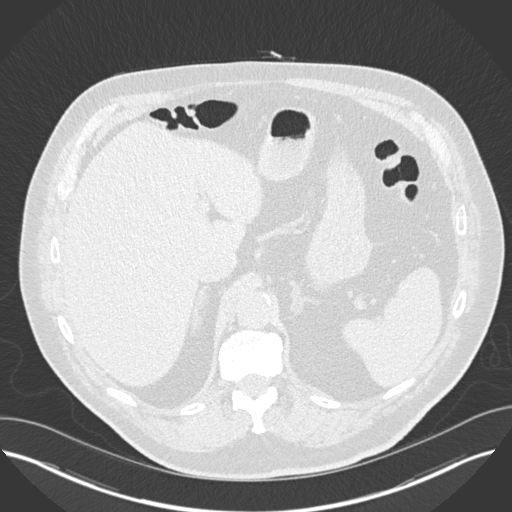
[im 41/185  lung]
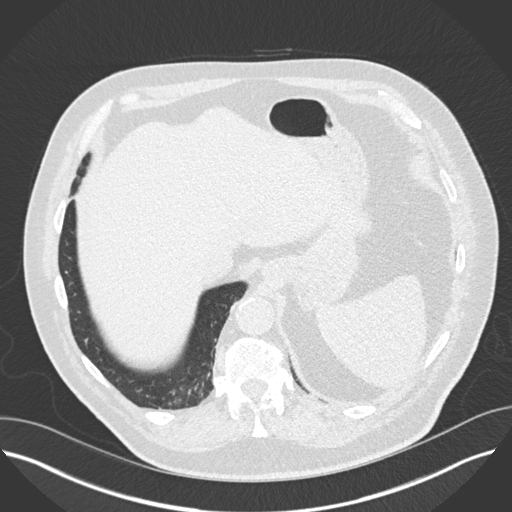
[im 55/185  lung]
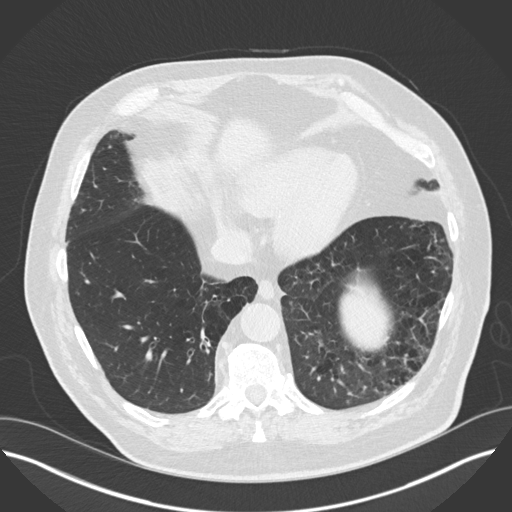
[im 69/185  mediastinal]
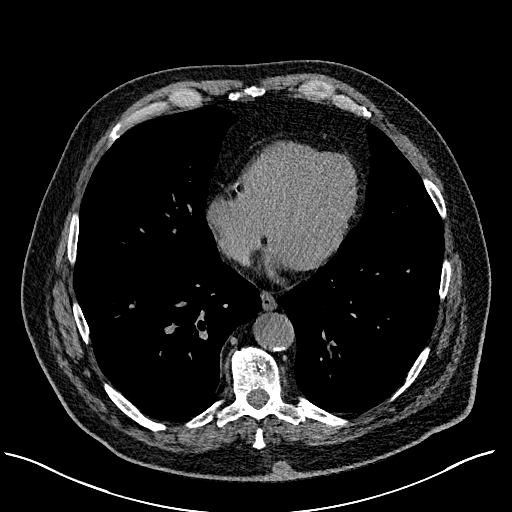
[im 69/185  lung]
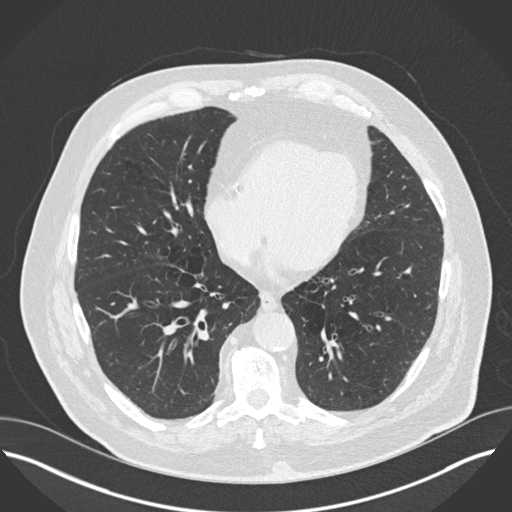
[im 82/185  lung]
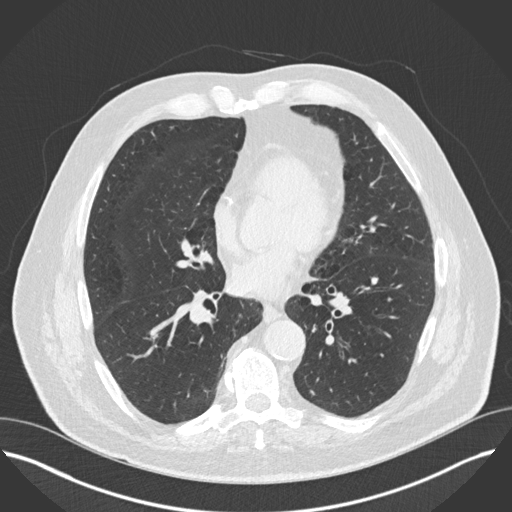
[im 103/185  lung]
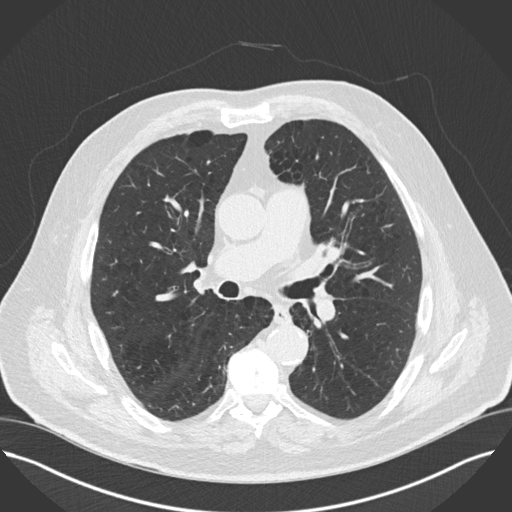
[im 116/185  lung]
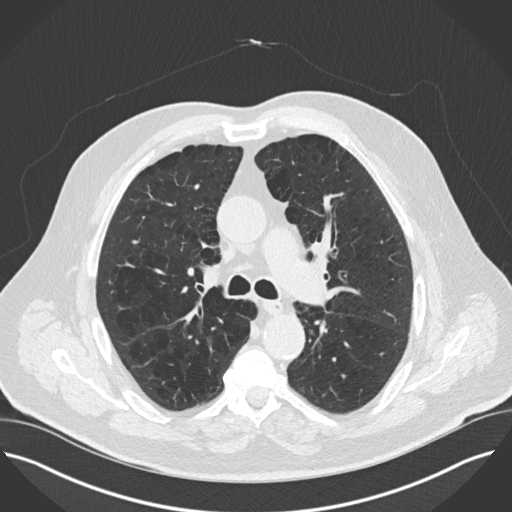
[im 130/185  mediastinal]
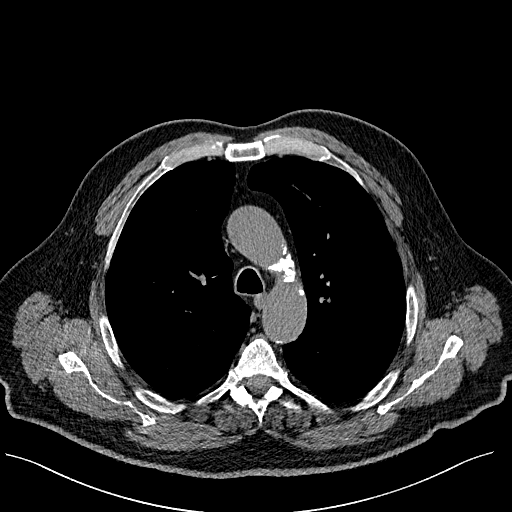
[im 130/185  lung]
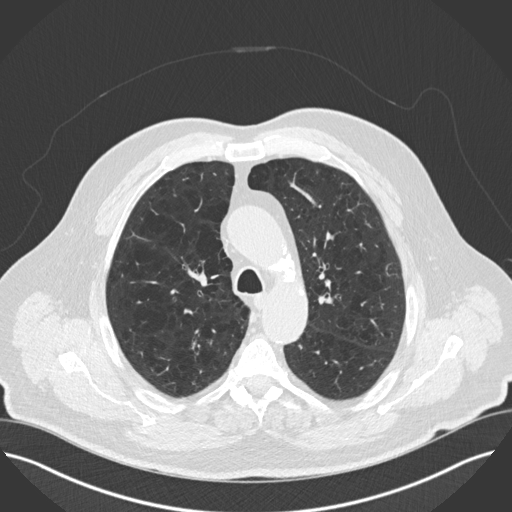
[im 144/185  lung]
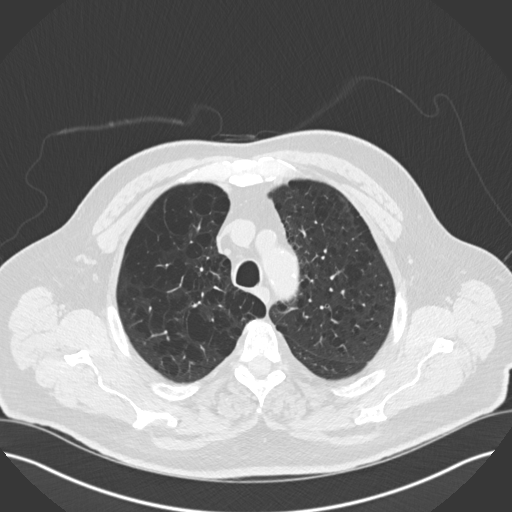
[im 157/185  lung]
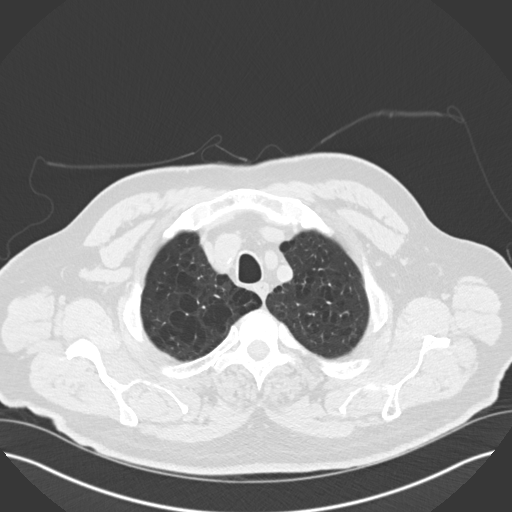
[im 171/185  lung]
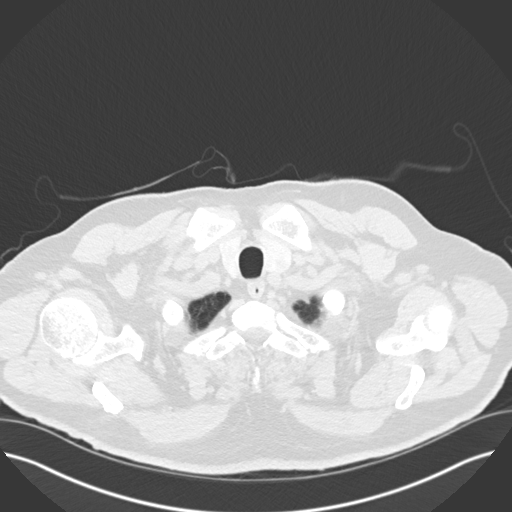

[Series 5: coronal · coronal · 0.72mm/px · 3 of 156 slices shown]
[im 32/156  lung]
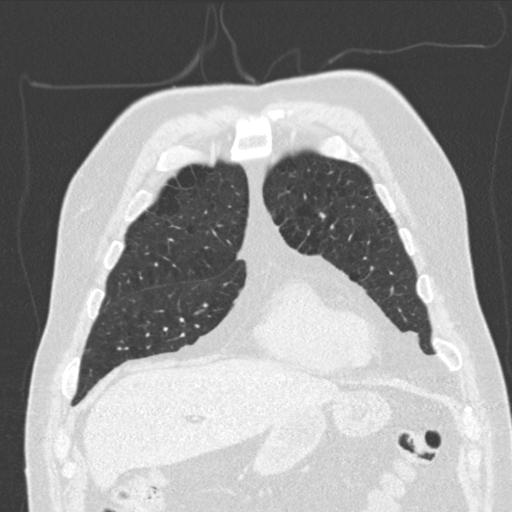
[im 63/156  lung]
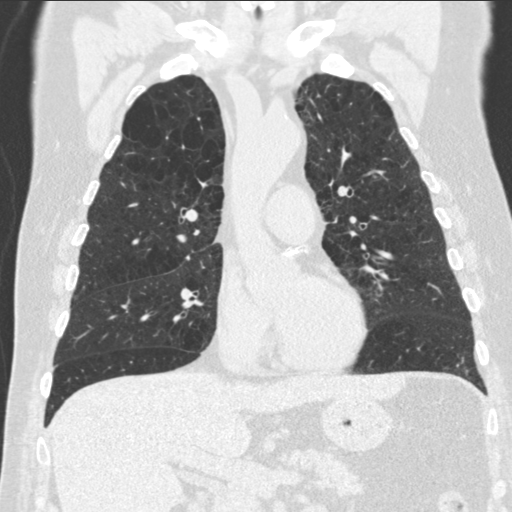
[im 94/156  lung]
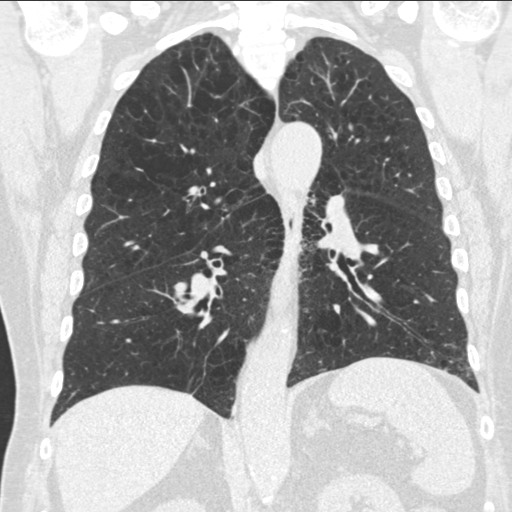

[15 of 36 positions shown; findings below may reference images not displayed]

FINDINGS: Chest wall: No chest wall mass, supraclavicular or axillary
lymphadenopathy. Small bilateral thyroid gland nodules are stable.

Cardiovascular: The heart is normal in size. No pericardial
effusion. Stable low aortic calcifications but no focal aneurysm.
Stable three-vessel coronary artery calcifications.

Mediastinum/Nodes: No mass or adenopathy. Small scattered lymph
nodes are stable. The esophagus is normal.

Lungs/Pleura: Stable emphysematous changes and pulmonary scarring.
The mid ear density in the right lower lobe on image number 125
measures approximately 14 x 7 mm a.m. this unchanged when compared
to the CT scan from [TQ]. There is also a stable 3.5 mm nodule in
the superior segment of the right lower lobe on image number 96. No
new pulmonary lesions. No acute overlying pulmonary process. No
pleural effusion.

Upper Abdomen: Stable bilateral adrenal gland adenomas. No worrisome
adrenal lesions or hepatic lesions. No upper abdominal adenopathy.
Stable atherosclerotic calcifications involving the abdominal aorta
and branch vessels.

Musculoskeletal: No significant bony findings.
IMPRESSION: 1. Stable linear nodular density in the right lower lobe since [TQ].
Recommend continued surveillance. Repeat noncontrast chest CT in
[DATE] is suggested to be a 1 year followup the PET-CT.
2. Stable 3.5 mm right lower lobe pulmonary nodule.
3. No mediastinal or hilar mass or adenopathy.
4. Stable atherosclerotic calcifications involving the aorta and
coronary arteries.
5. Stable small bilateral thyroid nodules.

## 2015-11-21 ENCOUNTER — Telehealth: Payer: Self-pay | Admitting: Pulmonary Disease

## 2015-11-21 NOTE — Telephone Encounter (Signed)
CT chest 11/13/15 >> no progression   Will have my nurse inform pt that CT chest looks stable.  Spot in Rt lung has not changed.  He will need f/u CT in 1 year to continue monitoring.

## 2015-11-21 NOTE — Telephone Encounter (Signed)
Results have been explained to patient, pt expressed understanding. Nothing further needed.  

## 2016-02-01 ENCOUNTER — Encounter: Payer: Self-pay | Admitting: Pulmonary Disease

## 2016-02-01 ENCOUNTER — Ambulatory Visit (INDEPENDENT_AMBULATORY_CARE_PROVIDER_SITE_OTHER): Payer: 59 | Admitting: Pulmonary Disease

## 2016-02-01 VITALS — BP 120/70 | HR 78 | Ht 73.0 in | Wt 223.4 lb

## 2016-02-01 DIAGNOSIS — R05 Cough: Secondary | ICD-10-CM

## 2016-02-01 DIAGNOSIS — J432 Centrilobular emphysema: Secondary | ICD-10-CM | POA: Diagnosis not present

## 2016-02-01 DIAGNOSIS — J9611 Chronic respiratory failure with hypoxia: Secondary | ICD-10-CM

## 2016-02-01 DIAGNOSIS — R911 Solitary pulmonary nodule: Secondary | ICD-10-CM

## 2016-02-01 DIAGNOSIS — R058 Other specified cough: Secondary | ICD-10-CM

## 2016-02-01 NOTE — Patient Instructions (Signed)
Will schedule CT chest for April 2018 and follow after this is done

## 2016-02-01 NOTE — Progress Notes (Signed)
Current Outpatient Prescriptions on File Prior to Visit  Medication Sig  . atorvastatin (LIPITOR) 20 MG tablet Take 20 mg by mouth at bedtime.   Marland Kitchen buPROPion (WELLBUTRIN XL) 300 MG 24 hr tablet Take 300 mg by mouth daily.   . calcium-vitamin D (OSCAL WITH D) 250-125 MG-UNIT per tablet Take 1 tablet by mouth daily.  . carbidopa-levodopa (SINEMET IR) 25-100 MG tablet Take 0.5 tablets by mouth 3 (three) times daily.  . cetirizine (ZYRTEC) 10 MG tablet Take 10 mg by mouth daily.  . ferrous sulfate 325 (65 FE) MG tablet Take 325 mg by mouth daily with supper.  Nyoka Cowden Tea 315 MG CAPS Take 315 mg by mouth daily.  . hydrochlorothiazide (HYDRODIURIL) 25 MG tablet Take 25 mg by mouth daily.   Marland Kitchen MEGARED OMEGA-3 KRILL OIL PO Take 1 capsule by mouth daily.  . metFORMIN (GLUCOPHAGE) 500 MG tablet Take 1,000 mg by mouth 2 (two) times daily with a meal.   . Multiple Vitamins-Minerals (MULTIVITAMIN WITH MINERALS) tablet Take 1 tablet by mouth daily.    Marland Kitchen omeprazole (PRILOSEC OTC) 20 MG tablet Take 20 mg by mouth daily with breakfast.  . sertraline (ZOLOFT) 100 MG tablet Take 100 mg by mouth daily.   . vardenafil (LEVITRA) 20 MG tablet Take 20 mg by mouth daily as needed for erectile dysfunction.    No current facility-administered medications on file prior to visit.     Chief Complaint  Patient presents with  . Follow-up    3 month follow. Breathing has been ok for the past few months. No chest pain or SOB.     Sleep tests PSG 01/25/08 >> RDI 6.8, SpO2 low 84%. Spent 79% of test time with SpO2 < 90%  Pulmonary tests Spirometry 06/08/12 >> FEV1 1.91 (49%), FEV1% 59 ONO with RA 06/16/12 >> Test time 9 hrs 21 min. Mean SpO2 89%, low SpO2 63%. Spent 2 hrs 25 min with SpO2 < 88% 07/07/12 >> SpO2 86% on room air with exertion >> start 2 liters oxygen with exertion and sleep A1AT 07/07/12 >> 154, PI-MM  Chest imaging CT chest 03/23/13 >> mild/diffuse centrilobular emphysema, 4 mm nodule RLL superior  segment, 4 mm nodule LLL CT chest 02/20/14 >> moderate centrilobular/paraseptal emphysema, mild diffuse bronchial wall thickening, 3 mm nodule RLL and 5 mm nodule LLL no change CT chest 02/13/15 >> new 1.5 cm nodule RLL. CT chest 05/28/15 >> increased RLL irregular nodule to 2 cm PET scan 06/14/15 >> 1.93 SUV RLL 11 mm lesion CT chest 11/13/15 >> no progression  Past medical history Glaucoma, Blind Lt eye, Depression, HLD, HTN, DM, ED, Tremor  Past surgical history, Family history, Social history, Allergies reviewed  Vital Signs BP 120/70 (BP Location: Left Arm, Patient Position: Sitting, Cuff Size: Normal)   Pulse 78   Ht '6\' 1"'$  (1.854 m)   Wt 223 lb 6.4 oz (101.3 kg)   SpO2 96%   BMI 29.47 kg/m   History of Present Illness James [name] is a 70 y.o. male former smoker with COPD/emphysema, chronic hypoxic respiratory failure on supplemental oxygen at night and exertion, and pulmonary nodule.  He has been doing well.  He has lost weight after he was told his DM was getting worse.  He uses oxygen with sleep.  No cough, wheeze, or chest congestion.  Sinuses better - usually a problem in the Fall.  Physical Exam  General - No distress ENT - No sinus tenderness, no oral exudate, no LAN  Cardiac - s1s2 regular, no murmur Chest - No wheeze/rales/dullness Back - No focal tenderness Abd - Soft, non-tender Ext - No edema Neuro - Normal strength Skin - No rashes Psych - normal mood, and behavior   Assessment/Plan  COPD with emphysema. - Didn't feel benefit from LAMA, LABA, ICS. - monitor clinically  Chronic respiratory failure with nocturnal hypoxemia from COPD. - continue 3 liters oxygen with sleep  Right lower lung mass. - needs follow up CT chest w/o contrast in April 2018  Upper airway cough syndrome from rhinitis and post-nasal drip. - he can try OTC nasal steroid if symptoms get worse   Patient Instructions  Will schedule CT chest for April 2018 and follow after  this is done   Chesley Mires, MD [facility] Pager:  [phone] 02/01/2016, 12:06 PM

## 2016-03-07 DIAGNOSIS — D649 Anemia, unspecified: Secondary | ICD-10-CM | POA: Diagnosis not present

## 2016-03-20 DIAGNOSIS — Z862 Personal history of diseases of the blood and blood-forming organs and certain disorders involving the immune mechanism: Secondary | ICD-10-CM | POA: Diagnosis not present

## 2016-03-20 DIAGNOSIS — E119 Type 2 diabetes mellitus without complications: Secondary | ICD-10-CM | POA: Diagnosis not present

## 2016-03-21 ENCOUNTER — Encounter: Payer: Self-pay | Admitting: Gastroenterology

## 2016-03-21 ENCOUNTER — Encounter (INDEPENDENT_AMBULATORY_CARE_PROVIDER_SITE_OTHER): Payer: Self-pay

## 2016-03-21 ENCOUNTER — Ambulatory Visit (INDEPENDENT_AMBULATORY_CARE_PROVIDER_SITE_OTHER): Payer: 59 | Admitting: Gastroenterology

## 2016-03-21 VITALS — BP 114/60 | HR 80 | Ht 73.0 in | Wt 223.2 lb

## 2016-03-21 DIAGNOSIS — K558 Other vascular disorders of intestine: Secondary | ICD-10-CM | POA: Diagnosis not present

## 2016-03-21 DIAGNOSIS — D500 Iron deficiency anemia secondary to blood loss (chronic): Secondary | ICD-10-CM | POA: Diagnosis not present

## 2016-03-21 DIAGNOSIS — K5520 Angiodysplasia of colon without hemorrhage: Secondary | ICD-10-CM | POA: Insufficient documentation

## 2016-03-21 NOTE — Patient Instructions (Addendum)
Continue iron supplement daily.   Follow up with your primary care physician.   Thank you for choosing me and [facility].  Pricilla Riffle. Dagoberto Ligas., MD., [facility]

## 2016-03-21 NOTE — Progress Notes (Signed)
    History of Present Illness: This is a 71 year old male returning for follow-up of recurrent iron deficiency anemia. Prior evaluation for iron deficiency anemia in 2017 summarized below. Iron deficiency was felt to be secondary to small intestinal AVMs. Iron deficiency corrected while on iron therapy however he became anemic again after discontinuing iron. He has no gastrointestinal complaints.  Capsule endoscopy 07/2015: multiple small nonbleeding AVMs throughout the small intestine.  EGD 06/2015: small hiatal hernia, mild gastritis, small gastric polyps.  Colonoscopy 06/2015: mild diverticulosis, prior anastomosis, small internal hemorrhoids  Current Medications, Allergies, Past Medical History, Past Surgical History, Family History and Social History were reviewed in [employer] record.  Physical Exam: General: Well developed, well nourished, no acute distress Head: Normocephalic and atraumatic Eyes:  sclerae anicteric, EOMI Ears: Normal auditory acuity Mouth: No deformity or lesions Lungs: Clear throughout to auscultation Heart: Regular rate and rhythm; no murmurs, rubs or bruits Abdomen: Soft, non tender and non distended. No masses, hepatosplenomegaly or hernias noted. Normal Bowel sounds Musculoskeletal: Symmetrical with no gross deformities  Pulses:  Normal pulses noted Extremities: No clubbing, cyanosis, edema or deformities noted Neurological: Alert oriented x 4, grossly nonfocal Psychological:  Alert and cooperative. Normal mood and affect  Assessment and Recommendations:  1. 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 His anemia corrected on iron replacement therapy and recurred off iron. Anticipate mild, ongoing, chronic blood loss from small bowlel AVMs and he will need chronic iron therapy or chronic intermittent iron therapy with routine CBCs per his primary physician. No further GI evaluation is necessary at this time.

## 2016-03-26 ENCOUNTER — Ambulatory Visit (INDEPENDENT_AMBULATORY_CARE_PROVIDER_SITE_OTHER): Payer: 59 | Admitting: Neurology

## 2016-03-26 ENCOUNTER — Encounter: Payer: Self-pay | Admitting: Neurology

## 2016-03-26 VITALS — BP 128/62 | HR 78 | Resp 18 | Ht 73.0 in | Wt 222.0 lb

## 2016-03-26 DIAGNOSIS — G20 Parkinson's disease: Secondary | ICD-10-CM | POA: Diagnosis not present

## 2016-03-26 DIAGNOSIS — R413 Other amnesia: Secondary | ICD-10-CM | POA: Diagnosis not present

## 2016-03-26 MED ORDER — CARBIDOPA-LEVODOPA 25-100 MG PO TABS: 1.0000 | ORAL_TABLET | Freq: Three times a day (TID) | ORAL | 6 refills | Status: DC
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
## 2016-03-26 NOTE — Patient Instructions (Addendum)
Let's try to increase your Sinemet to 1 pill 3 times a day.  Please remember to try to maintain good sleep hygiene, which means: Keep a regular sleep and wake schedule, try not to exercise or have a meal within 2 hours of your bedtime, try to keep your bedroom conducive for sleep, that is, cool and dark, without light distractors such as an illuminated alarm clock, and refrain from watching TV right before sleep or in the middle of the night and do not keep the TV or radio on during the night. Also, try not to use or play on electronic devices at bedtime, such as your cell phone, tablet PC or laptop. If you like to read at bedtime on an electronic device, try to dim the background light as much as possible. Do not eat in the middle of the night.  We will do a 4 month check up with one of our nurse practitioners, and I will see you back after that.  I am worried about your driving, please have your family/spouse monitor it and I would suggest at this point only local roads, familiar routes, no nighttime and no highway driving.

## 2016-03-27 DIAGNOSIS — D500 Iron deficiency anemia secondary to blood loss (chronic): Secondary | ICD-10-CM | POA: Diagnosis not present

## 2016-04-02 DIAGNOSIS — H2513 Age-related nuclear cataract, bilateral: Secondary | ICD-10-CM | POA: Diagnosis not present

## 2016-04-02 DIAGNOSIS — H401133 Primary open-angle glaucoma, bilateral, severe stage: Secondary | ICD-10-CM | POA: Diagnosis not present

## 2016-04-02 DIAGNOSIS — H5440 Blindness, one eye, unspecified eye: Secondary | ICD-10-CM | POA: Diagnosis not present

## 2016-04-17 DIAGNOSIS — D500 Iron deficiency anemia secondary to blood loss (chronic): Secondary | ICD-10-CM | POA: Diagnosis not present

## 2016-04-18 DIAGNOSIS — D649 Anemia, unspecified: Secondary | ICD-10-CM | POA: Diagnosis not present

## 2016-04-29 ENCOUNTER — Other Ambulatory Visit: Payer: Self-pay | Admitting: Pulmonary Disease

## 2016-05-28 ENCOUNTER — Ambulatory Visit (INDEPENDENT_AMBULATORY_CARE_PROVIDER_SITE_OTHER)
Admission: RE | Admit: 2016-05-28 | Discharge: 2016-05-28 | Disposition: A | Discharge status: Home / Self Care | Payer: Medicare Other | Source: Ambulatory Visit | Attending: Pulmonary Disease | Admitting: Pulmonary Disease

## 2016-05-28 DIAGNOSIS — R918 Other nonspecific abnormal finding of lung field: Secondary | ICD-10-CM | POA: Diagnosis not present

## 2016-05-28 DIAGNOSIS — J439 Emphysema, unspecified: Secondary | ICD-10-CM | POA: Diagnosis not present

## 2016-05-28 DIAGNOSIS — J479 Bronchiectasis, uncomplicated: Secondary | ICD-10-CM | POA: Diagnosis not present

## 2016-05-28 DIAGNOSIS — R911 Solitary pulmonary nodule: Secondary | ICD-10-CM | POA: Diagnosis not present

## 2016-05-28 IMAGING — CT CT CHEST W/O CM
2 of 3 series · 15 of 36 positions shown, 18 images · non-contrast
Comparison: Multiple studies dating back to [DATE]

CLINICAL DATA: Followup indeterminate right lung nodule. COPD with
emphysema.

EXAM:
CT CHEST WITHOUT CONTRAST
TECHNIQUE: Multidetector CT imaging of the chest was performed following the
standard protocol without IV contrast.

[Series 2: thorax · axial · 0.83mm/px · z∈[-376,-72]mm · 12 of 180 slices shown, 15 images]
[im 14/180  mediastinal]
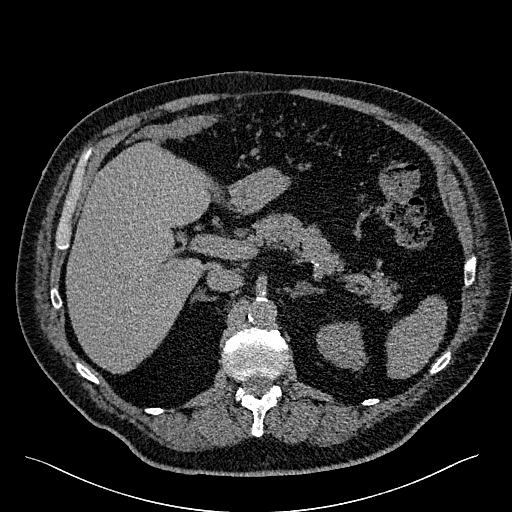
[im 14/180  lung]
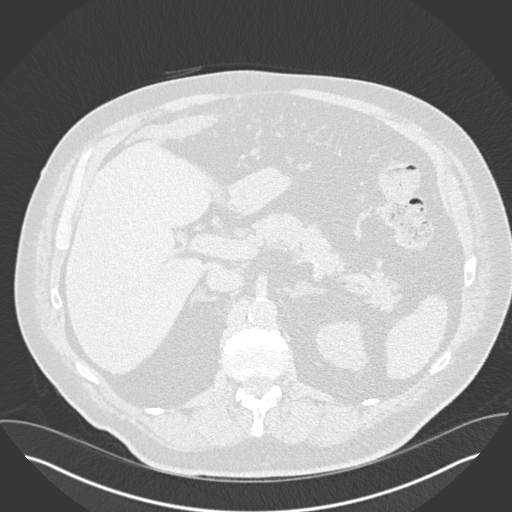
[im 27/180  lung]
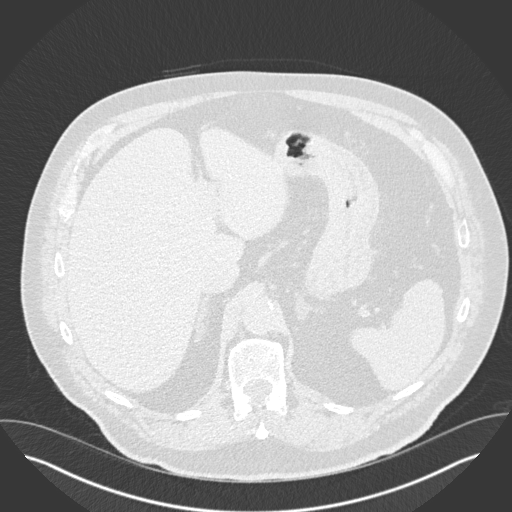
[im 40/180  lung]
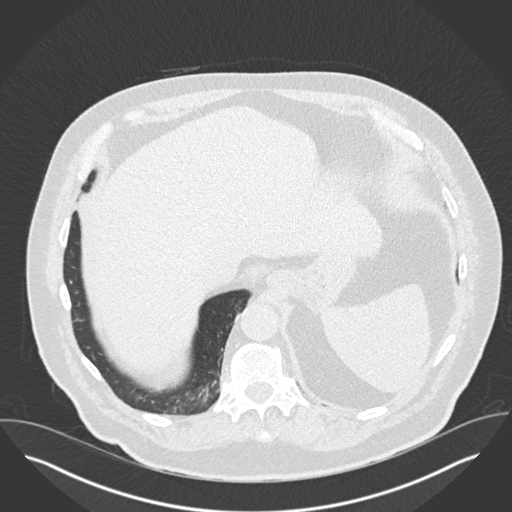
[im 54/180  lung]
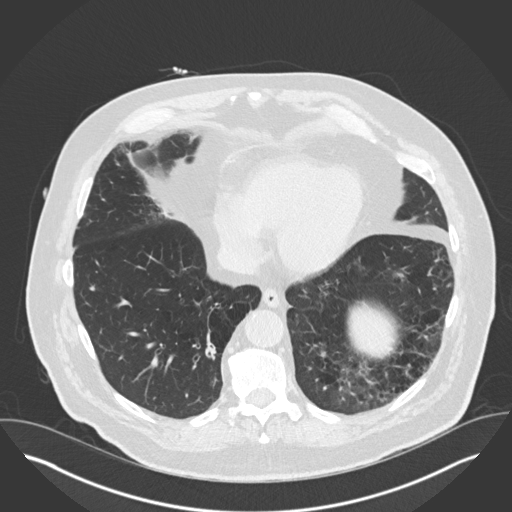
[im 67/180  mediastinal]
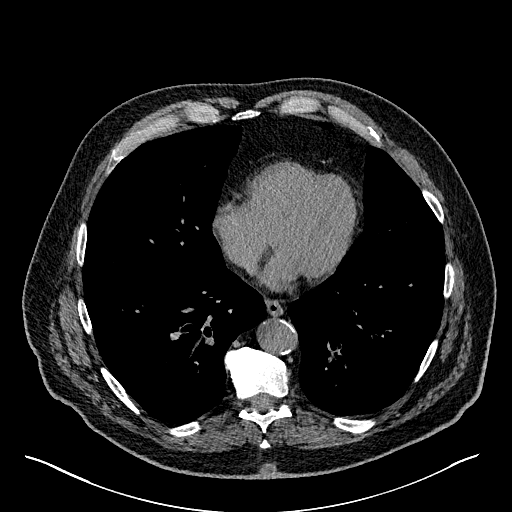
[im 67/180  lung]
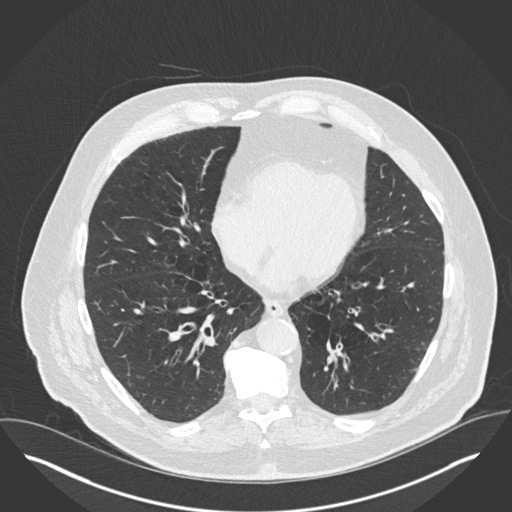
[im 80/180  lung]
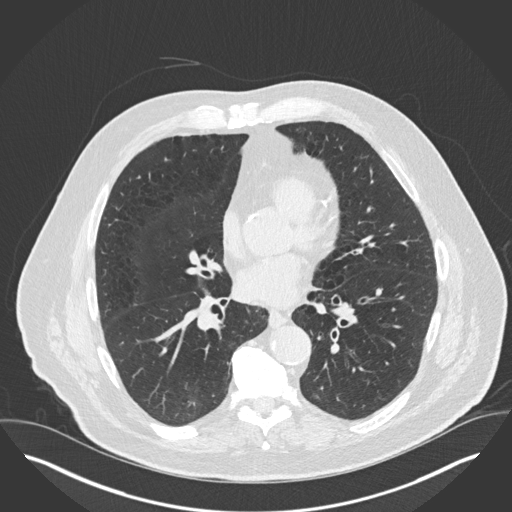
[im 100/180  lung]
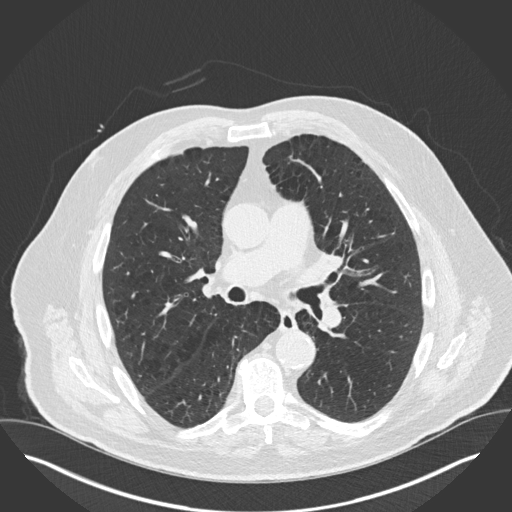
[im 113/180  lung]
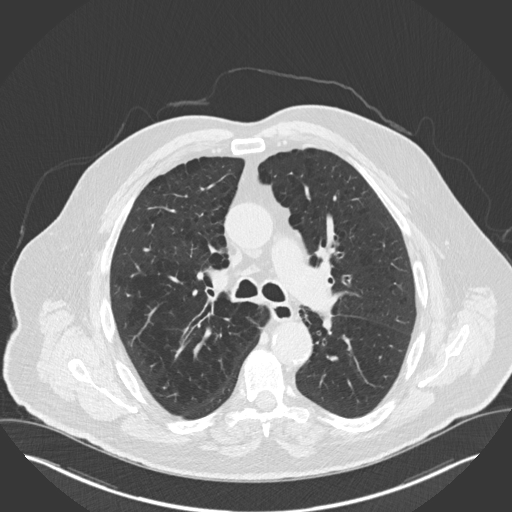
[im 126/180  mediastinal]
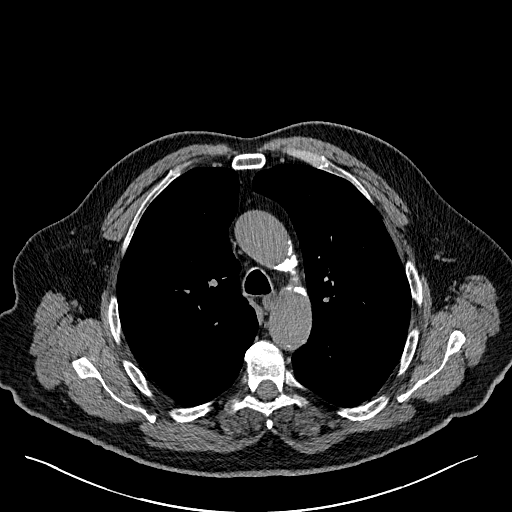
[im 126/180  lung]
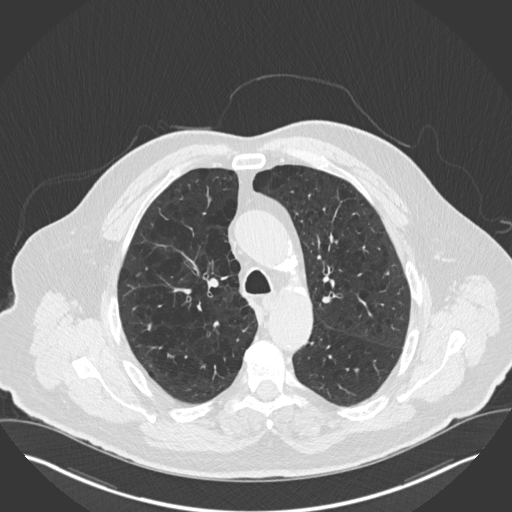
[im 140/180  lung]
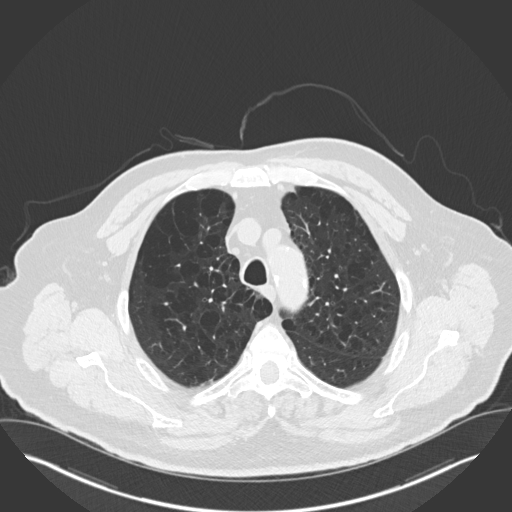
[im 153/180  lung]
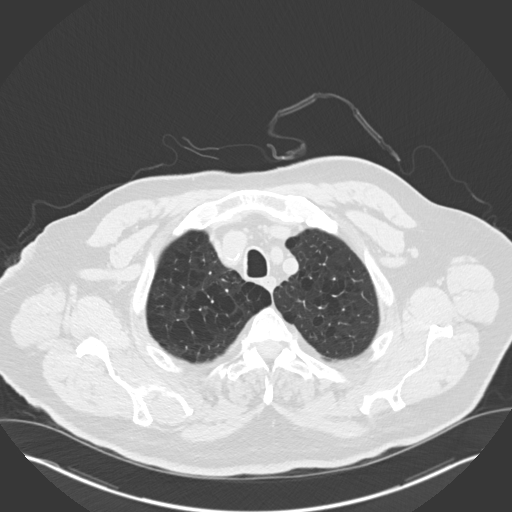
[im 166/180  lung]
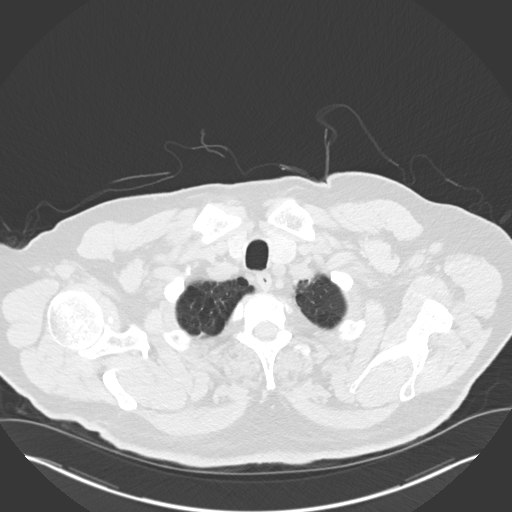

[Series 5: coronal · coronal · 0.70mm/px · 3 of 166 slices shown]
[im 34/166  lung]
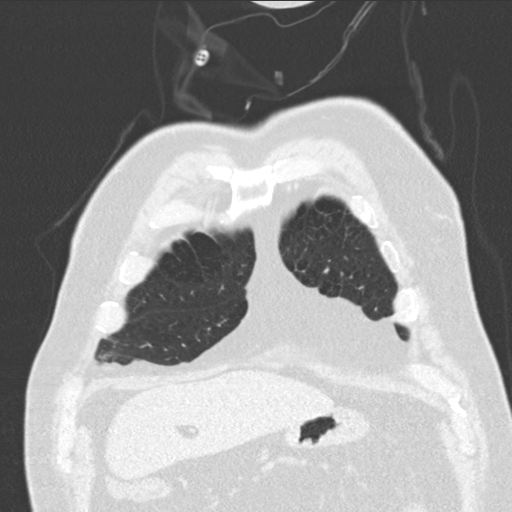
[im 67/166  lung]
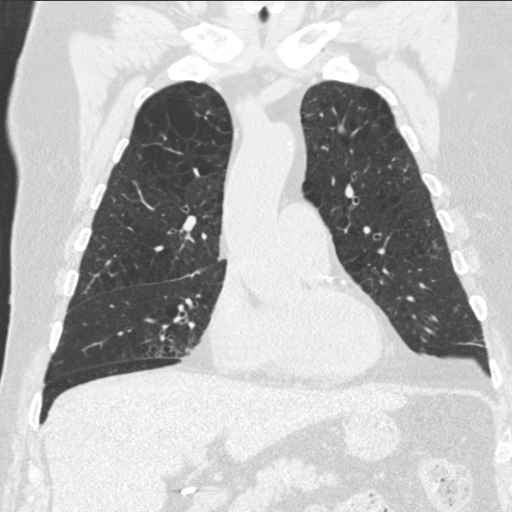
[im 100/166  lung]
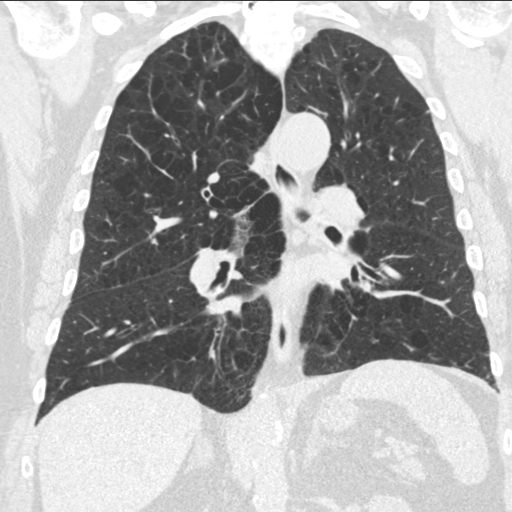

[15 of 36 positions shown; findings below may reference images not displayed]

FINDINGS: Cardiovascular: No acute findings. Aortic and coronary artery
atherosclerosis.

Mediastinum/Nodes: No masses or pathologically enlarged lymph nodes
identified on this unenhanced exam.

Lungs/Pleura: Moderate emphysema and mild bibasilar bronchiectasis
again noted. Scattered sub-cm bilateral pulmonary nodules are again
demonstrated. Dominant irregular pulmonary nodule in the posterior
right lower lobe measures 7 x 12 mm on image 122/3, without
significant change in size compared to previous studies. No evidence
of pulmonary infiltrate or pleural effusion.

Upper Abdomen:  Unremarkable.

Musculoskeletal:  No suspicious bone lesions.
IMPRESSION: Indeterminate pulmonary nodules including dominant nodule in the
posterior right lower lobe without significant change. Low-grade
adenocarcinoma cannot definitely be excluded, and continued followup
by CT is recommended 12 months.

Moderate emphysema and mild bibasilar bronchiectasis.

Aortic and coronary artery atherosclerosis.

## 2016-06-04 ENCOUNTER — Ambulatory Visit (INDEPENDENT_AMBULATORY_CARE_PROVIDER_SITE_OTHER): Payer: Medicare Other | Admitting: Pulmonary Disease

## 2016-06-04 ENCOUNTER — Encounter: Payer: Self-pay | Admitting: Pulmonary Disease

## 2016-06-04 VITALS — BP 128/74 | HR 93 | Ht 73.0 in | Wt 227.0 lb

## 2016-06-04 DIAGNOSIS — J9611 Chronic respiratory failure with hypoxia: Secondary | ICD-10-CM

## 2016-06-04 DIAGNOSIS — R911 Solitary pulmonary nodule: Secondary | ICD-10-CM

## 2016-06-04 DIAGNOSIS — J432 Centrilobular emphysema: Secondary | ICD-10-CM | POA: Diagnosis not present

## 2016-06-04 DIAGNOSIS — R05 Cough: Secondary | ICD-10-CM | POA: Diagnosis not present

## 2016-06-04 DIAGNOSIS — R058 Other specified cough: Secondary | ICD-10-CM

## 2016-06-04 NOTE — Patient Instructions (Signed)
Will arrange for CT chest for April 2019  Follow up in 1 year

## 2016-06-04 NOTE — Progress Notes (Signed)
Current Outpatient Prescriptions on File Prior to Visit  Medication Sig  . albuterol (PROVENTIL) (2.5 MG/3ML) 0.083% nebulizer solution TAKE 3 MLS VIA NEBULIZER EVERY 6 HRS AS NEEDED FOR WHEEZING OR SHORTNESS OF BREATH  . atorvastatin (LIPITOR) 20 MG tablet Take 20 mg by mouth at bedtime.   Marland Kitchen buPROPion (WELLBUTRIN XL) 300 MG 24 hr tablet Take 300 mg by mouth daily.   . calcium-vitamin D (OSCAL WITH D) 250-125 MG-UNIT per tablet Take 1 tablet by mouth daily.  . carbidopa-levodopa (SINEMET IR) 25-100 MG tablet Take 1 tablet by mouth 3 (three) times daily.  . cetirizine (ZYRTEC) 10 MG tablet Take 10 mg by mouth daily.  . ferrous sulfate 325 (65 FE) MG tablet Take 325 mg by mouth daily with supper.  Nyoka Cowden Tea 315 MG CAPS Take 315 mg by mouth daily.  . hydrochlorothiazide (HYDRODIURIL) 25 MG tablet Take 25 mg by mouth daily.   Marland Kitchen MEGARED OMEGA-3 KRILL OIL PO Take 1 capsule by mouth daily.  . metFORMIN (GLUCOPHAGE) 500 MG tablet Take 1,000 mg by mouth 2 (two) times daily with a meal.   . Multiple Vitamins-Minerals (MULTIVITAMIN WITH MINERALS) tablet Take 1 tablet by mouth daily.    Marland Kitchen omeprazole (PRILOSEC OTC) 20 MG tablet Take 20 mg by mouth daily with breakfast.  . sertraline (ZOLOFT) 100 MG tablet Take 100 mg by mouth daily.   . vardenafil (LEVITRA) 20 MG tablet Take 20 mg by mouth daily as needed for erectile dysfunction.    No current facility-administered medications on file prior to visit.     Chief Complaint  Patient presents with  . Follow-up    Pt states that breathing has been okay since last OV. No new complaints.     Sleep tests PSG 01/25/08 >> RDI 6.8, SpO2 low 84%. Spent 79% of test time with SpO2 < 90%  Pulmonary tests Spirometry 06/08/12 >> FEV1 1.91 (49%), FEV1% 59 ONO with RA 06/16/12 >> Test time 9 hrs 21 min. Mean SpO2 89%, low SpO2 63%. Spent 2 hrs 25 min with SpO2 < 88% 07/07/12 >> SpO2 86% on room air with exertion >> start 2 liters oxygen with exertion and  sleep A1AT 07/07/12 >> 154, PI-MM  Chest imaging CT chest 03/23/13 >> mild/diffuse centrilobular emphysema, 4 mm nodule RLL superior segment, 4 mm nodule LLL CT chest 02/20/14 >> moderate centrilobular/paraseptal emphysema, mild diffuse bronchial wall thickening, 3 mm nodule RLL and 5 mm nodule LLL no change CT chest 02/13/15 >> new 1.5 cm nodule RLL. CT chest 05/28/15 >> increased RLL irregular nodule to 2 cm PET scan 06/14/15 >> 1.93 SUV RLL 11 mm lesion CT chest 11/13/15 >> no progression CT chest 05/28/16 >> no change in lung nodule  Past medical history Glaucoma, Blind Lt eye, Depression, HLD, HTN, DM, ED, Parkinson disease  Past surgical history, Family history, Social history, Allergies reviewed  Vital Signs BP 128/74 (BP Location: Left Arm, Cuff Size: Normal)   Pulse 93   Ht '6\' 1"'$  (1.854 m)   Wt 227 lb (103 kg)   SpO2 92%   BMI 29.95 kg/m   History of Present Illness James [name] is a 71 y.o. male former smoker with COPD/emphysema, chronic hypoxic respiratory failure on supplemental oxygen at night and exertion, and pulmonary nodule.  He had CT chest on 05/28/16.  No change in RLL nodule.    Denies cough, wheeze, chest pain, fever, sputum, swelling.  Physical Exam  General - pleasant ENT - no sinus  tenderness, no oral exudate, no LAN Cardiac - regular, no murmur Chest - no wheeze Back - no tenderness Abd - soft, non tender Ext - no edema Neuro - normal strength Skin - no rashes Psych - normal mood   Assessment/Plan  COPD with emphysema. - Didn't feel benefit from LAMA, LABA, ICS. - monitor clinically  Chronic respiratory failure with nocturnal hypoxemia from COPD. - continue 3 liters O2 at night  Right lower lung mass. - f/u CT chest w/o contrast in April 2019  Upper airway cough syndrome from rhinitis and post-nasal drip. - prn OTC meds   Patient Instructions  Will arrange for CT chest for April 2019  Follow up in 1 year   Chesley Mires,  MD [facility] Pager:  [phone] 06/04/2016, 2:20 PM

## 2016-06-17 DIAGNOSIS — Z862 Personal history of diseases of the blood and blood-forming organs and certain disorders involving the immune mechanism: Secondary | ICD-10-CM | POA: Diagnosis not present

## 2016-06-17 DIAGNOSIS — E7800 Pure hypercholesterolemia, unspecified: Secondary | ICD-10-CM | POA: Diagnosis not present

## 2016-06-17 DIAGNOSIS — E119 Type 2 diabetes mellitus without complications: Secondary | ICD-10-CM | POA: Diagnosis not present

## 2016-06-19 DIAGNOSIS — E119 Type 2 diabetes mellitus without complications: Secondary | ICD-10-CM | POA: Diagnosis not present

## 2016-06-19 DIAGNOSIS — I10 Essential (primary) hypertension: Secondary | ICD-10-CM | POA: Diagnosis not present

## 2016-07-04 DIAGNOSIS — H5440 Blindness, one eye, unspecified eye: Secondary | ICD-10-CM | POA: Diagnosis not present

## 2016-07-04 DIAGNOSIS — E119 Type 2 diabetes mellitus without complications: Secondary | ICD-10-CM | POA: Diagnosis not present

## 2016-07-04 DIAGNOSIS — H53131 Sudden visual loss, right eye: Secondary | ICD-10-CM | POA: Diagnosis not present

## 2016-07-04 DIAGNOSIS — H401133 Primary open-angle glaucoma, bilateral, severe stage: Secondary | ICD-10-CM | POA: Diagnosis not present

## 2016-07-04 DIAGNOSIS — H2513 Age-related nuclear cataract, bilateral: Secondary | ICD-10-CM | POA: Diagnosis not present

## 2016-07-04 DIAGNOSIS — H538 Other visual disturbances: Secondary | ICD-10-CM | POA: Diagnosis not present

## 2016-07-09 DIAGNOSIS — H5211 Myopia, right eye: Secondary | ICD-10-CM | POA: Diagnosis not present

## 2016-07-09 DIAGNOSIS — H401133 Primary open-angle glaucoma, bilateral, severe stage: Secondary | ICD-10-CM | POA: Diagnosis not present

## 2016-07-09 DIAGNOSIS — H524 Presbyopia: Secondary | ICD-10-CM | POA: Diagnosis not present

## 2016-07-09 DIAGNOSIS — H538 Other visual disturbances: Secondary | ICD-10-CM | POA: Diagnosis not present

## 2016-07-10 DIAGNOSIS — H53131 Sudden visual loss, right eye: Secondary | ICD-10-CM | POA: Diagnosis not present

## 2016-07-10 DIAGNOSIS — E119 Type 2 diabetes mellitus without complications: Secondary | ICD-10-CM | POA: Diagnosis not present

## 2016-07-24 ENCOUNTER — Ambulatory Visit (INDEPENDENT_AMBULATORY_CARE_PROVIDER_SITE_OTHER): Payer: Medicare Other | Admitting: Adult Health

## 2016-07-24 ENCOUNTER — Encounter: Payer: Self-pay | Admitting: Adult Health

## 2016-07-24 VITALS — BP 139/78 | HR 82 | Ht 73.0 in | Wt 237.4 lb

## 2016-07-24 DIAGNOSIS — G20 Parkinson's disease: Secondary | ICD-10-CM

## 2016-07-24 DIAGNOSIS — R269 Unspecified abnormalities of gait and mobility: Secondary | ICD-10-CM | POA: Diagnosis not present

## 2016-07-24 MED ORDER — CARBIDOPA-LEVODOPA 25-100 MG PO TABS: 1.5000 | ORAL_TABLET | Freq: Three times a day (TID) | ORAL | 6 refills | Status: DC

## 2016-07-24 NOTE — Patient Instructions (Signed)
Continue Sinemet  Referral for physical therapy If your symptoms worsen or you develop new symptoms please let us know.
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
## 2016-08-07 DIAGNOSIS — I10 Essential (primary) hypertension: Secondary | ICD-10-CM | POA: Diagnosis not present

## 2016-08-07 DIAGNOSIS — E119 Type 2 diabetes mellitus without complications: Secondary | ICD-10-CM | POA: Diagnosis not present

## 2016-08-07 DIAGNOSIS — R5383 Other fatigue: Secondary | ICD-10-CM | POA: Diagnosis not present

## 2016-08-07 DIAGNOSIS — Z0000 Encounter for general adult medical examination without abnormal findings: Secondary | ICD-10-CM | POA: Diagnosis not present

## 2016-08-07 DIAGNOSIS — E118 Type 2 diabetes mellitus with unspecified complications: Secondary | ICD-10-CM | POA: Diagnosis not present

## 2016-08-07 DIAGNOSIS — Z125 Encounter for screening for malignant neoplasm of prostate: Secondary | ICD-10-CM | POA: Diagnosis not present

## 2016-08-13 DIAGNOSIS — B079 Viral wart, unspecified: Secondary | ICD-10-CM | POA: Diagnosis not present

## 2016-08-13 DIAGNOSIS — I10 Essential (primary) hypertension: Secondary | ICD-10-CM | POA: Diagnosis not present

## 2016-08-13 DIAGNOSIS — Z0001 Encounter for general adult medical examination with abnormal findings: Secondary | ICD-10-CM | POA: Diagnosis not present

## 2016-08-13 DIAGNOSIS — L719 Rosacea, unspecified: Secondary | ICD-10-CM | POA: Diagnosis not present

## 2016-08-13 DIAGNOSIS — R5383 Other fatigue: Secondary | ICD-10-CM | POA: Diagnosis not present

## 2016-08-13 DIAGNOSIS — Z1212 Encounter for screening for malignant neoplasm of rectum: Secondary | ICD-10-CM | POA: Diagnosis not present

## 2016-08-13 DIAGNOSIS — G4700 Insomnia, unspecified: Secondary | ICD-10-CM | POA: Diagnosis not present

## 2016-08-13 DIAGNOSIS — E118 Type 2 diabetes mellitus with unspecified complications: Secondary | ICD-10-CM | POA: Diagnosis not present

## 2016-08-13 DIAGNOSIS — N529 Male erectile dysfunction, unspecified: Secondary | ICD-10-CM | POA: Diagnosis not present

## 2016-08-18 ENCOUNTER — Ambulatory Visit: Payer: Medicare Other | Attending: Adult Health | Admitting: Physical Therapy

## 2016-08-18 DIAGNOSIS — R2689 Other abnormalities of gait and mobility: Secondary | ICD-10-CM | POA: Diagnosis not present

## 2016-08-18 DIAGNOSIS — R293 Abnormal posture: Secondary | ICD-10-CM | POA: Diagnosis not present

## 2016-08-18 DIAGNOSIS — R2681 Unsteadiness on feet: Secondary | ICD-10-CM | POA: Insufficient documentation

## 2016-08-18 NOTE — Therapy (Signed)
[facility] [address] [place], [place], [address] Phone: [phone]   Fax:  [phone]  Physical Therapy Evaluation  Patient Details  Name: James [name] MRN: [id number] Date of Birth: [birth year] Referring Provider: Ward Givens, NP  Encounter Date: 08/18/2016      PT End of Session - 08/18/16 2048    Visit Number 1   Number of Visits 10   Date for PT Re-Evaluation 10/17/16   Authorization Type Medicare Primary, [employer] secondary-GCODE every 10th visit   PT Start Time 1105   PT Stop Time 1146   PT Time Calculation (min) 41 min   Activity Tolerance Patient tolerated treatment well   Behavior During Therapy [facility] for tasks assessed/performed      Past Medical History:  Diagnosis Date  . Allergy    seasonal  . Blind left eye    legally  . Blood transfusion    2002  . COPD (chronic obstructive pulmonary disease) ([place])   . Depression   . Diabetes mellitus, type 2 ([place])   . Diverticulosis   . ED (erectile dysfunction)   . Glaucoma   . Hyperlipidemia   . Hypertension   . Insomnia   . Internal and external bleeding hemorrhoids   . Iron deficiency anemia   . OSA (obstructive sleep apnea)   . Osteoarthritis   . Rosacea   . Tremor of both hands   . Tubular adenoma of colon 03/2011    Past Surgical History:  Procedure Laterality Date  . ARTHROSCOPIC REPAIR ACL    . CHOLECYSTECTOMY    . [place]   right  . COLONOSCOPY WITH PROPOFOL N/A 07/25/2015   Procedure: COLONOSCOPY WITH PROPOFOL;  Surgeon: [name], MD;  Location: [facility];  Service: Endoscopy;  Laterality: N/A;  . ESOPHAGOGASTRODUODENOSCOPY (EGD) WITH PROPOFOL N/A 07/25/2015   Procedure: ESOPHAGOGASTRODUODENOSCOPY (EGD) WITH PROPOFOL;  Surgeon: [name], MD;  Location: [facility];  Service: Endoscopy;  Laterality: N/A;  . KNEE ARTHROSCOPY  1996  . PARTIAL COLECTOMY  2008  . THYROID CYST  EXCISION    . TONSILLECTOMY      There were no vitals filed for this visit.       Subjective Assessment - 08/18/16 1108    Subjective Pt has history of Parkinson's for at least one year.  He reports tremors both hands, tripping sometimes with walking.  Wife says I shuffle more with walking.  Notes changes in walking and balance over the past couple of years.  Pt reports 2 falls in the past 6 months. He reports the falls were backwards.  One fall was [place] steps at beach and second fall was sliding and hit the car door with head.   Pertinent History hyperlipidemia, depression, OSA, obesti, HTN, glaucoma, osteopenia, COPD; Parkinsonism x 1-2 years   Patient Stated Goals Pt's goal for therapy is to avoid tripping again and walk better.   Currently in Pain? No/denies            [facility] PT Assessment - 08/18/16 1112      Assessment   Medical Diagnosis abnormality of gait, Parkinsonism   Referring Provider Ward Givens, NP   Onset Date/Surgical Date 07/24/16  MD visit     Precautions   Precautions Fall     Balance Screen   Has the patient fallen in the past 6 months Yes   How many times? 2   Has the  patient had a decrease in activity level because of a fear of falling?  Yes   Is the patient reluctant to leave their home because of a fear of falling?  No     Home Environment   Living Environment Private residence   Living Arrangements Spouse/significant other   Available Help at Discharge Family   Type of [place] to enter   Entrance Stairs-Number of Steps 1  3 steps to enter beach home with rails   Entrance Stairs-Rails None   Home Layout Two level;Bed/bath upstairs   Home Equipment None     Prior Function   Level of Independence Independent with basic ADLs;Independent with household mobility without device;Independent with community mobility without device   Vocation Retired   Leisure [place]  difficulty on soft sand.  Walks in his neighborhood about 30 minutes per day.     Observation/Other Assessments   Focus on Therapeutic Outcomes (FOTO)  NA     ROM / Strength   AROM / PROM / Strength Strength     Strength   Overall Strength Comments Grossly tested 5/5 bilateral hip flexors, quads, hamstrings.  4/5 R ankle dorsfilexion, 5/5 L ankle dorsiflexion     Palpation   Palpation comment Pt c/o pain at times along R lateral thigh.  Palpated iliotibial band on RLE, with pain upon palpation along middle of band     Transfers   Transfers Sit to Stand;Stand to Sit   Sit to Stand 6: Modified independent (Device/Increase time);Without upper extremity assist;From chair/3-in-1   Five time sit to stand comments  13.03 sec   Stand to Sit 6: Modified independent (Device/Increase time);Without upper extremity assist;To chair/3-in-1     Ambulation/Gait   Ambulation/Gait Yes   Ambulation/Gait Assistance 6: Modified independent (Device/Increase time)   Ambulation Distance (Feet) 300 Feet   Assistive device None   Gait Pattern Step-through pattern;Poor foot clearance - left;Poor foot clearance - right;Narrow base of support   Ambulation Surface Level;Indoor   Gait velocity 10.56= 3.11 ft/sec     Standardized Balance Assessment   Standardized Balance Assessment Timed Up and Go Test     Timed Up and Go Test   Normal TUG (seconds) 12.37   Manual TUG (seconds) 10.91   Cognitive TUG (seconds) 10.31   TUG Comments Scores >13.5-15 seconds indicate increased fall risk     High Level Balance   High Level Balance Comments MiniBESTest:  20/28 (Scores <22/28 indicate increased fall risk)  Pt has difficulty with rising up on toes, SLS, step strategy forward and backward, unable to maintain balance EC, feet together on foam.        Mini-BESTest: Balance Evaluation Systems Test  2005-2013 [place]. All rights  reserved. ________________________________________________________________________________________Anticipatory_________Subscore__4___/6 1. SIT TO STAND Instruction: "Cross your arms across your chest. Try not to use your hands unless you must.Do not let your legs lean against the back of the chair when you stand. Please stand up now." X(2) Normal: Comes to stand without use of hands and stabilizes independently. (1) Moderate: Comes to stand WITH use of hands on first attempt. (0) Severe: Unable to stand up from chair without assistance, OR needs several attempts with use of hands. 2. RISE TO TOES Instruction: "Place your feet shoulder width apart. Place your hands on your hips. Try to rise as high as you can onto your toes. I will count out loud to 3 seconds. Try to  hold this pose for at least 3 seconds. Look straight ahead. Rise now." (2) Normal: Stable for 3 s with maximum height. X(1) Moderate: Heels up, but not full range (smaller than when holding hands), OR noticeable instability for 3 s. (0) Severe: < 3 s. 3. STAND ON ONE LEG Instruction: "Look straight ahead. Keep your hands on your hips. Lift your leg off of the ground behind you without touching or resting your raised leg upon your other standing leg. Stay standing on one leg as long as you can. Look straight ahead. Lift now." Left: Time in Seconds Trial 1:__0.87 sec___Trial 2:__2.19 sec___ (2) Normal: 20 s. X(1) Moderate: < 20 s. (0) Severe: Unable. Right: Time in Seconds Trial 1:_2.29 sec____Trial 2:_2.09 sec____ (2) Normal: 20 s. X(1) Moderate: < 20 s. (0) Severe: Unable To score each side separately use the trial with the longest time. To calculate the sub-score and total score use the side [left or right] with the lowest numerical score [i.e. the worse side]. ______________________________________________________________________________________Reactive Postural Control___________Subscore:___4__/6 4. COMPENSATORY STEPPING  CORRECTION- FORWARD Instruction: "Stand with your feet shoulder width apart, arms at your sides. Lean forward against my hands beyond your forward limits. When I let go, do whatever is necessary, including taking a step, to avoid a fall." (2) Normal: Recovers independently with a single, large step (second realignment step is allowed). X(1) Moderate: More than one step used to recover equilibrium. (0) Severe: No step, OR would fall if not caught, OR falls spontaneously. 5. COMPENSATORY STEPPING CORRECTION- BACKWARD Instruction: "Stand with your feet shoulder width apart, arms at your sides. Lean backward against my hands beyond your backward limits. When I let go, do whatever is necessary, including taking a step, to avoid a fall." (2) Normal: Recovers independently with a single, large step. X(1) Moderate: More than one step used to recover equilibrium. (0) Severe: No step, OR would fall if not caught, OR falls spontaneously. 6. COMPENSATORY STEPPING CORRECTION- LATERAL Instruction: "Stand with your feet together, arms down at your sides. Lean into my hand beyond your sideways limit. When I let go, do whatever is necessary, including taking a step, to avoid a fall." Left X(2) Normal: Recovers independently with 1 step (crossover or lateral OK). (1) Moderate: Several steps to recover equilibrium. (0) Severe: Falls, or cannot step. Right X(2) Normal: Recovers independently with 1 step (crossover or lateral OK). (1) Moderate: Several steps to recover equilibrium. (0) Severe: Falls, or cannot step. Use the side with the lowest score to calculate sub-score and total score. ____________________________________________________________________________________Sensory Orientation_____________Subscore:_______4__/6 7. STANCE (FEET TOGETHER); EYES OPEN, FIRM SURFACE Instruction: "Place your hands on your hips. Place your feet together until almost touching. Look straight ahead. Be as stable and  still as possible, until I say stop." Time in seconds:________ X(2) Normal: 30 s. (1) Moderate: < 30 s. (0) Severe: Unable. 8. STANCE (FEET TOGETHER); EYES CLOSED, FOAM SURFACE Instruction: "Step onto the foam. Place your hands on your hips. Place your feet together until almost touching. Be as stable and still as possible, until I say stop. I will start timing when you close your eyes." Time in seconds:________ (2) Normal: 30 s. (1) Moderate: < 30 s. X(0) Severe: Unable. 9. INCLINE- EYES CLOSED Instruction: "Step onto the incline ramp. Please stand on the incline ramp with your toes toward the top. Place your feet shoulder width apart and have your arms down at your sides. I will start timing when you close your eyes." Time in seconds:________ X(2) Normal: Stands independently  30 s and aligns with gravity. (1) Moderate: Stands independently <30 s OR aligns with surface. (0) Severe: Unable. _________________________________________________________________________________________Dynamic Gait ______Subscore_____8___/10 10. CHANGE IN GAIT SPEED Instruction: "Begin walking at your normal speed, when I tell you 'fast', walk as fast as you can. When I say 'slow', walk very slowly." X(2) Normal: Significantly changes walking speed without imbalance. (1) Moderate: Unable to change walking speed or signs of imbalance. (0) Severe: Unable to achieve significant change in walking speed AND signs of imbalance. [place] - HORIZONTAL Instruction: "Begin walking at your normal speed, when I say "right", turn your head and look to the right. When I say "left" turn your head and look to the left. Try to keep yourself walking in a straight line." (2) Normal: performs head turns with no change in gait speed and good balance. X(1) Moderate: performs head turns with reduction in gait speed. (0) Severe: performs head turns with imbalance. 12. WALK WITH PIVOT TURNS Instruction: "Begin  walking at your normal speed. When I tell you to 'turn and stop', turn as quickly as you can, face the opposite direction, and stop. After the turn, your feet should be close together." (2) Normal: Turns with feet close FAST (< 3 steps) with good balance. X(1) Moderate: Turns with feet close SLOW (>4 steps) with good balance. (0) Severe: Cannot turn with feet close at any speed without imbalance. 13. STEP OVER OBSTACLES Instruction: "Begin walking at your normal speed. When you get to the box, step over it, not around it and keep walking." X(2) Normal: Able to step over box with minimal change of gait speed and with good balance. (1) Moderate: Steps over box but touches box OR displays cautious behavior by slowing gait. (0) Severe: Unable to step over box OR steps around box. 14. TIMED UP & GO WITH DUAL TASK [3 METER WALK] Instruction TUG: "When I say 'Go', stand up from chair, walk at your normal speed across the tape on the floor, turn around, and come back to sit in the chair." Instruction TUG with Dual Task: "Count backwards by threes starting at ___. When I say 'Go', stand up from chair, walk at your normal speed across the tape on the floor, turn around, and come back to sit in the chair. Continue counting backwards the entire time." TUG: ________seconds; Dual Task TUG: ________seconds X(2) Normal: No noticeable change in sitting, standing or walking while backward counting when compared to TUG without Dual Task. (1) Moderate: Dual Task affects either counting OR walking (>10%) when compared to the TUG without Dual Task. (0) Severe: Stops counting while walking OR stops walking while counting. When scoring item 14, if subject's gait speed slows more than 10% between the TUG without and with a Dual Task the score should be decreased by a point. TOTAL SCORE: ___20_____/28      Objective measurements completed on examination: See above findings.                        PT Long Term Goals - 08/18/16 2057      PT LONG TERM GOAL #1   Title Pt will be independent with HEP for improved balance, gait.  TARGET 09/19/16   Time 5   Period Weeks   Status New     PT LONG TERM GOAL #2   Title Pt will improve SLS to at least 3 seconds bilaerally for improved obstacle and stair negotiation.   Time 5  Period Weeks   Status New     PT LONG TERM GOAL #3   Title Pt will improve MiniBESTest score to at least 22/28 for decreased fall risk.   Time 5   Period Weeks   Status New     PT LONG TERM GOAL #4   Title Pt will ambulate at least 1000 ft indoor and outdoor surfaces, independently, no LOB.   Time 5   Period Weeks   Status New     PT LONG TERM GOAL #5   Title Pt will verbalize understanding of local Parkinson's disease resources.   Time 5   Period Weeks   Status New     Additional Long Term Goals   Additional Long Term Goals Yes     PT LONG TERM GOAL #6   Title Pt will verbalize understanding of fall prevention in home environment.   Time 5   Period Weeks   Status New                Plan - 08/23/2016 2050    Clinical Impression Statement Pt is a 71 year old male who presents to OP PT with history of Parkinsonism, with reported changes in gait and balance over the past several years.  Pt was seen by Ward Givens, NP on 07/24/16, and he presents to OP PT with decreased balance, decreased timing and coordination with gait, abnormal posture, history of 2 falls in the past 6 months.  Pt is at fall risk per MiniBESTest, with difficulty with compliant surface, SLS, balance strategy recovery, and turns.  Pt enjoys going to [employer] for [employer]  Pt will benefit from skilled PT to address the above stated deficits for improved functional mobility and decreased fall risk.   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 Moves in standing   Consulted and Agree with Plan of Care Patient      Patient will benefit from skilled therapeutic intervention in order to improve the following deficits and impairments:  Abnormal gait, Decreased mobility, Decreased balance, Decreased strength, Difficulty walking, Postural dysfunction  Visit Diagnosis: Other abnormalities of gait and mobility  Unsteadiness on feet  Abnormal posture      G-Codes - August 23, 2016 09-May-2100    Functional Assessment Tool Used (Outpatient Only) 5x sit<>stand 13.03 sec; MiniBESTest 20/28, SLS <2 seconds each leg, 2 falls in the past 6 months   Functional Limitation Mobility: Walking and moving around   Mobility: Walking and Moving Around Current Status [phone]) At least 20 percent but less than 40 percent impaired, limited or restricted   Mobility: Walking and Moving Around Goal Status [phone]) At least 1 percent but less than 20 percent impaired, limited or restricted       Problem List Patient Active Problem List   Diagnosis Date Noted  . AVM (arteriovenous malformation) of small bowel, acquired ([place]) 03/21/2016  .  Anemia, iron deficiency   . Heme positive stool   . History of colonic polyps   . Pulmonary nodules 04/14/2013  . Upper airway cough syndrome 04/14/2013  . Hypoxemia 06/08/2012  . COPD with emphysema ([place]) 06/17/2007    MARRIOTT,AMY W. 08/18/2016,  9:03 PM  Frazier Butt., PT   [facility] [address] [place], [place], [address] Phone: [phone]   Fax:  [phone]  Name: James [name] MRN: [id number] Date of Birth: 1945-08-05

## 2016-08-21 ENCOUNTER — Ambulatory Visit: Payer: Medicare Other | Admitting: Physical Therapy

## 2016-08-21 DIAGNOSIS — R2681 Unsteadiness on feet: Secondary | ICD-10-CM

## 2016-08-21 DIAGNOSIS — R2689 Other abnormalities of gait and mobility: Secondary | ICD-10-CM

## 2016-08-21 DIAGNOSIS — R293 Abnormal posture: Secondary | ICD-10-CM | POA: Diagnosis not present

## 2016-08-21 NOTE — Therapy (Signed)
[facility] [address] [place], [place], [address] Phone: [phone]   Fax:  [phone]  Physical Therapy Treatment  Patient Details  Name: James [name] MRN: [id number] Date of Birth: 05/01/1945 Referring Provider: Ward Givens, NP  Encounter Date: 08/21/2016      PT End of Session - 08/21/16 1200    Visit Number 1   Number of Visits 10   Date for PT Re-Evaluation 10/17/16   Authorization Type Medicare Primary, [employer] secondary-GCODE every 10th visit   PT Start Time 1015   PT Stop Time 1057   PT Time Calculation (min) 42 min   Activity Tolerance Patient tolerated treatment well   Behavior During Therapy [facility] for tasks assessed/performed      Past Medical History:  Diagnosis Date  . Allergy    seasonal  . Blind left eye    legally  . Blood transfusion    2002  . COPD (chronic obstructive pulmonary disease) ([place])   . Depression   . Diabetes mellitus, type 2 ([place])   . Diverticulosis   . ED (erectile dysfunction)   . Glaucoma   . Hyperlipidemia   . Hypertension   . Insomnia   . Internal and external bleeding hemorrhoids   . Iron deficiency anemia   . OSA (obstructive sleep apnea)   . Osteoarthritis   . Rosacea   . Tremor of both hands   . Tubular adenoma of colon 03/2011    Past Surgical History:  Procedure Laterality Date  . ARTHROSCOPIC REPAIR ACL    . CHOLECYSTECTOMY    . [place]   right  . COLONOSCOPY WITH PROPOFOL N/A 07/25/2015   Procedure: COLONOSCOPY WITH PROPOFOL;  Surgeon: [name], MD;  Location: [facility];  Service: Endoscopy;  Laterality: N/A;  . ESOPHAGOGASTRODUODENOSCOPY (EGD) WITH PROPOFOL N/A 07/25/2015   Procedure: ESOPHAGOGASTRODUODENOSCOPY (EGD) WITH PROPOFOL;  Surgeon: [name], MD;  Location: [facility];  Service: Endoscopy;  Laterality: N/A;  . KNEE ARTHROSCOPY  1996  . PARTIAL COLECTOMY  2008  . THYROID CYST EXCISION     . TONSILLECTOMY      There were no vitals filed for this visit.      Subjective Assessment - 08/21/16 1019    Subjective Nothing new since eval, other than I'm nauseous this morning-sometimes that happens.  Had a few trips with walking outside this week.   Pertinent History hyperlipidemia, depression, OSA, obesti, HTN, glaucoma, osteopenia, COPD; Parkinsonism x 1-2 years   Patient Stated Goals Pt's goal for therapy is to avoid tripping again and walk better.   Currently in Pain? No/denies                         [facility] Adult PT Treatment/Exercise - 08/21/16 0001      Ambulation/Gait   Ambulation/Gait Yes   Ambulation/Gait Assistance 6: Modified independent (Device/Increase time)   Ambulation Distance (Feet) 900 Feet   Assistive device None   Gait Pattern Step-through pattern;Poor foot clearance - left;Poor foot clearance - right;Narrow base of support  Improved foot clearance and heelstrike with cues   Ambulation Surface Level   Gait Comments Gait with conversation tasks, gait with environmental scanning (pt does not fully turn head to R or L with scanning).  Discussed importance of awareness with gait, especially inhousehold distances and outdoor/unlevel surfaces, for improved heelstrike, improved foot clearance in variable situations.  Exercises   Exercises Knee/Hip     Knee/Hip Exercises: Aerobic   Stepper SciFit, seated stepper, Level 1.8, lower extremities only, x 8 minutes, with RPM 40>60>80's for increased intensity           [facility]) - 08/21/16 1027    PWR! exercises Moves in standing   PWR! Up x 20 reps   PWR! Rock x 20 reps (10 each side)   PWR! Twist x 20 reps (10 each side)   PWR Step x 10 reps to each side; then at counter x 10 reps each leg forward, 10 reps each leg back direction; alternating legs with UE support   Comments Discussed PWR! Moves in standing and their relevance to function:  PWR! Up for posture, PWR! Rock for  Cl[place] and reaching; PWR! Twist for trunk rotation and trunk flexibility; PWR! Step for initiation of stepping, foot clearance and balance.  Pt requires verbal and visual cues for technique and for large amplitude, deliberate movement patterns.          Balance Exercises - 08/21/16 1021      Balance Exercises: Standing   SLS Eyes open;Upper extremity support 1;3 reps;10 secs   Heel Raises Limitations x 20 reps bilateral  then single limb up on toes x 10 reps, 3 second hold   Toe Raise Limitations x 20 reps bilaterally           PT Education - 08/21/16 1159    Education provided Yes   Education Details Initiated HEP-see instructions-initiated PWR! Moves in session with cues for large amplitude movement (will likely add to HEP next visit)   Person(s) Educated Patient   Methods Explanation;Demonstration;Handout   Comprehension Verbalized understanding;Returned demonstration             PT Long Term Goals - 08/18/16 2057      PT LONG TERM GOAL #1   Title Pt will be independent with HEP for improved balance, gait.  TARGET 09/19/16   Time 5   Period Weeks   Status New     PT LONG TERM GOAL #2   Title Pt will improve SLS to at least 3 seconds bilaerally for improved obstacle and stair negotiation.   Time 5   Period Weeks   Status New     PT LONG TERM GOAL #3   Title Pt will improve MiniBESTest score to at least 22/28 for decreased fall risk.   Time 5   Period Weeks   Status New     PT LONG TERM GOAL #4   Title Pt will ambulate at least 1000 ft indoor and outdoor surfaces, independently, no LOB.   Time 5   Period Weeks   Status New     PT LONG TERM GOAL #5   Title Pt will verbalize understanding of local Parkinson's disease resources.   Time 5   Period Weeks   Status New     Additional Long Term Goals   Additional Long Term Goals Yes     PT LONG TERM GOAL #6   Title Pt will verbalize understanding of fall prevention in home environment.   Time 5    Period Weeks   Status New               Plan - 08/21/16 1201    Clinical Impression Statement Initiated HEP to address SLS and ankle strength.  Initiated work on standing PWR! Moves for posture, weightshifting, trunk flexibility and transition stepping with cues for large  amplitude, deliberate movement patterns.  Will continue to address large amplitude, deliberate movement patterns with head turn activities.      Rehab Potential Good   PT Frequency 2x / week   PT Duration Other (comment)  5 week POC, 1x/1wk, then 2x/wk 4 weeks   PT Treatment/Interventions ADLs/Self Care Home Management;Gait training;Functional mobility training;Therapeutic activities;Therapeutic exercise;Balance training;Neuromuscular re-education;Patient/family education   PT Next Visit Plan Review HEP; work on [employer]! Moves in standing and add to HEP; continue gait and balance on compliant surfaces; balance strategy work   Oncologist with Plan of Care Patient      Patient will benefit from skilled therapeutic intervention in order to improve the following deficits and impairments:  Abnormal gait, Decreased mobility, Decreased balance, Decreased strength, Difficulty walking, Postural dysfunction  Visit Diagnosis: Unsteadiness on feet  Other abnormalities of gait and mobility     Problem List Patient Active Problem List   Diagnosis Date Noted  . AVM (arteriovenous malformation) of small bowel, acquired ([place]) 03/21/2016  . Anemia, iron deficiency   . Heme positive stool   . History of colonic polyps   . Pulmonary nodules 04/14/2013  . Upper airway cough syndrome 04/14/2013  . Hypoxemia 06/08/2012  . COPD with emphysema ([place]) 06/17/2007    Mohannad Olivero W. 08/21/2016, 12:05 PM  Frazier Butt., PT   [facility] [address] [place], [place], [address] Phone: [phone]   Fax:  [phone]  Name: James [name] MRN:  [id number] Date of Birth: Jun 30, 1945

## 2016-08-21 NOTE — Patient Instructions (Addendum)
Toe Up    Stand at the counter for support as needed.  Gently rise up on toes, then roll back on heels.  Hold 3 seconds each position. Repeat _20__ times. Do __1-2__ sessions per day.  http://gt2.exer.us/475   Copyright  VHI. All rights reserved.  One-Leg Heel Raise, Standing    Stand at the counter, holding on for support as needed.  Stand on one leg and rise up on toes (hold for 3 seconds) and return to foot flat position. Repeat with other leg. Do __2_ sets of __10_ repetitions.  Copyright  VHI. All rights reserved.  SINGLE LIMB STANCE    Stand at counter with support as needed with 1-2 hands on counter.  Stance: single leg on floor. Raise leg. Hold _10__ seconds. Repeat with other leg. __3_ reps per set, _1-2__ sets per day  Copyright  VHI. All rights reserved.

## 2016-08-25 ENCOUNTER — Encounter: Payer: Self-pay | Admitting: Physical Therapy

## 2016-08-25 ENCOUNTER — Ambulatory Visit: Payer: Medicare Other | Attending: Adult Health | Admitting: Physical Therapy

## 2016-08-25 DIAGNOSIS — R2681 Unsteadiness on feet: Secondary | ICD-10-CM | POA: Insufficient documentation

## 2016-08-25 DIAGNOSIS — R2689 Other abnormalities of gait and mobility: Secondary | ICD-10-CM | POA: Diagnosis not present

## 2016-08-25 NOTE — Therapy (Signed)
[facility] [address] [place], [place], [address] Phone: [phone]   Fax:  [phone]  Physical Therapy Treatment  Patient Details  Name: James [name] MRN: [id number] Date of Birth: 10-20-1945 Referring Provider: Ward Givens, NP  Encounter Date: 08/25/2016      PT End of Session - 08/25/16 2051    Visit Number 3   Number of Visits 10   Date for PT Re-Evaluation 10/17/16   Authorization Type Medicare Primary, [employer] secondary-GCODE every 10th visit   PT Start Time 1149   PT Stop Time 1232   PT Time Calculation (min) 43 min   Activity Tolerance Patient tolerated treatment well   Behavior During Therapy [facility] for tasks assessed/performed      Past Medical History:  Diagnosis Date  . Allergy    seasonal  . Blind left eye    legally  . Blood transfusion    2002  . COPD (chronic obstructive pulmonary disease) ([place])   . Depression   . Diabetes mellitus, type 2 ([place])   . Diverticulosis   . ED (erectile dysfunction)   . Glaucoma   . Hyperlipidemia   . Hypertension   . Insomnia   . Internal and external bleeding hemorrhoids   . Iron deficiency anemia   . OSA (obstructive sleep apnea)   . Osteoarthritis   . Rosacea   . Tremor of both hands   . Tubular adenoma of colon 03/2011    Past Surgical History:  Procedure Laterality Date  . ARTHROSCOPIC REPAIR ACL    . CHOLECYSTECTOMY    . [place]   right  . COLONOSCOPY WITH PROPOFOL N/A 07/25/2015   Procedure: COLONOSCOPY WITH PROPOFOL;  Surgeon: [name], MD;  [facility];  Service: Endoscopy;  Laterality: N/A;  . ESOPHAGOGASTRODUODENOSCOPY (EGD) WITH PROPOFOL N/A 07/25/2015   Procedure: ESOPHAGOGASTRODUODENOSCOPY (EGD) WITH PROPOFOL;  Surgeon: [name], MD;  [facility];  Service: Endoscopy;  Laterality: N/A;  . KNEE ARTHROSCOPY  1996  . PARTIAL COLECTOMY  2008  . THYROID CYST EXCISION     . TONSILLECTOMY      There were no vitals filed for this visit.      Subjective Assessment - 08/25/16 1153    Subjective Did some more walking since last visit. Goes early. Walked a mile   Pertinent History hyperlipidemia, depression, OSA, obesti, HTN, glaucoma, osteopenia, COPD; Parkinsonism x 1-2 years   Patient Stated Goals Pt's goal for therapy is to avoid tripping again and walk better.   Currently in Pain? No/denies                         [facility] Adult PT Treatment/Exercise - 08/25/16 1204      Ambulation/Gait   Ambulation/Gait Assistance 6: Modified independent (Device/Increase time)   Ambulation Distance (Feet) 240 Feet   Assistive device None   Gait Pattern Step-through pattern;Poor foot clearance - left;Poor foot clearance - right;Narrow base of support   Ambulation Surface Level     Knee/Hip Exercises: Aerobic   Stepper SciFit, seated stepper, Level 2.0, lower extremities only, x 8 minutes, with RPM 60>80's for increased intensity             Balance Exercises - 08/25/16 2046      Balance Exercises: Standing   Standing Eyes Opened Narrow base of support (BOS);Wide (BOA);Head turns;Foam/compliant surface  feet together, partial tandem Lt and  Rt foot leading   Standing Eyes Closed Wide (BOA);Foam/compliant surface;30 secs   Tandem Stance Eyes open;Foam/compliant surface;Intermittent upper extremity support   SLS Eyes open;Solid surface;Upper extremity support 1;30 secs;1 rep   Step Ups Forward;4 inch;Intermittent UE support  and backward; bil each direction   Balance Beam blue, intermittent UE support, head turns   Tandem Gait Forward;Retro;Intermittent upper extremity support;Foam/compliant surface;2 reps   Step Over Hurdles / Cones step over 4"high x 14" wide step forwards and backwards with bil UE support on // bars   Heel Raises Limitations x10 alternating with toe raises; bil then unilateral   Toe Raise Limitations x10 to check technique  for HEP           PT Education - 08/25/16 2050    Education provided Yes   Education Details addition to [employer]) Educated Patient   Methods Explanation;Demonstration;Verbal cues;Handout   Comprehension Verbalized understanding;Returned demonstration;Verbal cues required;Need further instruction             PT Long Term Goals - 08/18/16 2057      PT LONG TERM GOAL #1   Title Pt will be independent with HEP for improved balance, gait.  TARGET 09/19/16   Time 5   Period Weeks   Status New     PT LONG TERM GOAL #2   Title Pt will improve SLS to at least 3 seconds bilaerally for improved obstacle and stair negotiation.   Time 5   Period Weeks   Status New     PT LONG TERM GOAL #3   Title Pt will improve MiniBESTest score to at least 22/28 for decreased fall risk.   Time 5   Period Weeks   Status New     PT LONG TERM GOAL #4   Title Pt will ambulate at least 1000 ft indoor and outdoor surfaces, independently, no LOB.   Time 5   Period Weeks   Status New     PT LONG TERM GOAL #5   Title Pt will verbalize understanding of local Parkinson's disease resources.   Time 5   Period Weeks   Status New     Additional Long Term Goals   Additional Long Term Goals Yes     PT LONG TERM GOAL #6   Title Pt will verbalize understanding of fall prevention in home environment.   Time 5   Period Weeks   Status New               Plan - 08/25/16 2052    Clinical Impression Statement Session focused on gait training for improved step length and heel contact, stair training (clearing feet as he steps up/down), strengthening and balance. Patient required 2 seated rest breaks due to "sinus headache" and increased fatigue. Will continue to work towards [employer]   PT Frequency 2x / week   PT Duration Other (comment)  5 week POC, 1x/1wk, then 2x/wk 4 weeks   PT Treatment/Interventions ADLs/Self Care Home Management;Gait training;Functional mobility  training;Therapeutic activities;Therapeutic exercise;Balance training;Neuromuscular re-education;Patient/family education   PT Next Visit Plan Review HEP from 7/2; work on [employer]! Moves in standing and add to HEP; continue gait and balance on compliant surfaces; balance strategy work   Oncologist with Plan of Care Patient      Patient will benefit from skilled therapeutic intervention in order to improve the following deficits and impairments:  Abnormal gait, Decreased mobility, Decreased balance, Decreased strength, Difficulty walking,  Postural dysfunction  Visit Diagnosis: Unsteadiness on feet  Other abnormalities of gait and mobility     Problem List Patient Active Problem List   Diagnosis Date Noted  . AVM (arteriovenous malformation) of small bowel, acquired ([place]) 03/21/2016  . Anemia, iron deficiency   . Heme positive stool   . History of colonic polyps   . Pulmonary nodules 04/14/2013  . Upper airway cough syndrome 04/14/2013  . Hypoxemia 06/08/2012  . COPD with emphysema ([place]) 06/17/2007    Rexanne Mano, PT 08/25/2016, 8:56 PM  [facility] [address] [place], [place], [address] Phone: [phone]   Fax:  [phone]  Name: James [name] MRN: [id number] Date of Birth: 1945/04/19

## 2016-08-25 NOTE — Patient Instructions (Signed)
Balance: Eyes Closed - Bilateral (Varied Surfaces)    Stand, feet shoulder width, close eyes. Maintain balance 30 seconds. Repeat 3 times per set. Do 1 sets per session. Do 5 sessions per week.   Copyright  VHI. All rights reserved.  Feet Apart, Head Motion - Eyes Closed   With eyes closed and feet shoulder width apart, move head slowly, up and down. Repeat 3 times per session. Do 1 sessions per day.  Copyright  VHI. All rights reserved.  Feet Together, Head Motion - Eyes Closed      With eyes closed and feet together, move head slowly, up and down. Repeat 3 times per session. Do 1 sessions per day.   Copyright  VHI. All rights reserved.  Feet Partial Heel-Toe, Head Motion - Eyes Closed   With eyes closed and right foot partially in front of the other,  move head slowly, up and down. Repeat 3 times per session. Do 1 sessions per day. Copyright  VHI. All rights reserved.

## 2016-08-28 ENCOUNTER — Ambulatory Visit: Payer: Medicare Other | Admitting: Physical Therapy

## 2016-09-02 ENCOUNTER — Ambulatory Visit: Payer: Medicare Other | Admitting: Physical Therapy

## 2016-09-04 ENCOUNTER — Ambulatory Visit: Payer: Medicare Other | Admitting: Physical Therapy

## 2016-09-09 ENCOUNTER — Ambulatory Visit: Payer: Medicare Other | Admitting: Physical Therapy

## 2016-09-11 ENCOUNTER — Ambulatory Visit: Payer: Medicare Other | Admitting: Physical Therapy

## 2016-09-16 ENCOUNTER — Ambulatory Visit: Payer: Medicare Other | Admitting: Physical Therapy

## 2016-09-18 ENCOUNTER — Ambulatory Visit: Payer: Medicare Other | Admitting: Physical Therapy

## 2016-10-29 ENCOUNTER — Encounter: Payer: Self-pay | Admitting: Physical Therapy

## 2016-10-29 NOTE — Therapy (Signed)
[facility] [address] [place], [place], [address] Phone: [phone]   Fax:  [phone]  Patient Details  Name: James [name] MRN: [id number] Date of Birth: 02/17/1946 Referring Provider:  No ref. provider found  Encounter Date: 11/13/2016   PHYSICAL THERAPY DISCHARGE SUMMARY      G-Codes - 11/13/16 3976    Functional Assessment Tool Used (Outpatient Only) See eval measures-pt did not return after 3rd visit in July    Functional Limitation Mobility: Walking and moving around   Mobility: Walking and Moving Around Goal Status [phone]) At least 1 percent but less than 20 percent impaired, limited or restricted   Mobility: Walking and Moving Around Discharge Status [phone]) At least 20 percent but less than 40 percent impaired, limited or restricted      Visits from Start of Care: 3  Current functional level related to goals / functional outcomes: See eval, as pt did not return after third PT visit   Remaining deficits: See eval   Education / Equipment: Unable to complete HEP due to pt not returning after third visit.  Plan: Patient agrees to discharge.  Patient goals were not met. Patient is being discharged due to not returning since the last visit.  ?????Pt cancelled remaining visits due to death in family and travelling out of town; did not return or reschedule.       MARRIOTT,AMY W. 2016-11-13, 8:24 AM Mady Haagensen, PT 2016-11-13 8:25 AM Phone: [phone] Fax: [facility] [address] [place], [place], [address] Phone: [phone]   Fax:  [phone]

## 2016-11-24 ENCOUNTER — Ambulatory Visit (INDEPENDENT_AMBULATORY_CARE_PROVIDER_SITE_OTHER): Payer: Medicare Other | Admitting: Neurology

## 2016-11-24 ENCOUNTER — Encounter: Payer: Self-pay | Admitting: Neurology

## 2016-11-24 DIAGNOSIS — G20 Parkinson's disease: Secondary | ICD-10-CM

## 2016-11-24 MED ORDER — CARBIDOPA-LEVODOPA 25-100 MG PO TABS: 1.5000 | ORAL_TABLET | Freq: Three times a day (TID) | ORAL | 6 refills | Status: DC
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
## 2016-11-24 NOTE — Patient Instructions (Signed)
Your exam looks stable, which is good, I would like for you to continue with Sinemet 1 1/2 pills three times a day.  Follow up in 6 months with Megan.

## 2016-12-15 DIAGNOSIS — F339 Major depressive disorder, recurrent, unspecified: Secondary | ICD-10-CM | POA: Diagnosis not present

## 2016-12-15 DIAGNOSIS — I10 Essential (primary) hypertension: Secondary | ICD-10-CM | POA: Diagnosis not present

## 2016-12-15 DIAGNOSIS — Z23 Encounter for immunization: Secondary | ICD-10-CM | POA: Diagnosis not present

## 2016-12-15 DIAGNOSIS — E119 Type 2 diabetes mellitus without complications: Secondary | ICD-10-CM | POA: Diagnosis not present

## 2016-12-15 DIAGNOSIS — Z6832 Body mass index (BMI) 32.0-32.9, adult: Secondary | ICD-10-CM | POA: Diagnosis not present

## 2016-12-15 DIAGNOSIS — H547 Unspecified visual loss: Secondary | ICD-10-CM | POA: Diagnosis not present

## 2016-12-18 ENCOUNTER — Other Ambulatory Visit: Payer: Self-pay | Admitting: Podiatry

## 2016-12-18 ENCOUNTER — Ambulatory Visit (INDEPENDENT_AMBULATORY_CARE_PROVIDER_SITE_OTHER): Payer: Medicare Other | Admitting: Podiatry

## 2016-12-18 ENCOUNTER — Encounter: Payer: Self-pay | Admitting: Podiatry

## 2016-12-18 ENCOUNTER — Ambulatory Visit (INDEPENDENT_AMBULATORY_CARE_PROVIDER_SITE_OTHER): Payer: Medicare Other

## 2016-12-18 VITALS — BP 174/95 | HR 87 | Resp 16

## 2016-12-18 DIAGNOSIS — L84 Corns and callosities: Secondary | ICD-10-CM

## 2016-12-18 DIAGNOSIS — M79671 Pain in right foot: Secondary | ICD-10-CM

## 2016-12-18 DIAGNOSIS — M2041 Other hammer toe(s) (acquired), right foot: Secondary | ICD-10-CM

## 2016-12-18 DIAGNOSIS — D169 Benign neoplasm of bone and articular cartilage, unspecified: Secondary | ICD-10-CM | POA: Diagnosis not present

## 2016-12-18 NOTE — Progress Notes (Signed)
   Subjective:    Patient ID: James [name], male    DOB: [birth year], 71 y.o.   MRN: [id number]  HPI    Review of Systems  All other systems reviewed and are negative.      Objective:   Physical Exam        Assessment & Plan:

## 2016-12-18 NOTE — Progress Notes (Signed)
Subjective:    Patient ID: James [name], male   DOB: 71 y.o.   MRN: [id number]   HPI patient states his right fifth digit has been killing him making it hard for him to walk and he has tried wider shoes trimming and padding without relief and need something in order to be able to ambulate. Patient no longer smokes and has not smoked for over 3 years    Review of Systems  All other systems reviewed and are negative.       Objective:  Physical Exam  Constitutional: He appears well-developed and well-nourished.  Cardiovascular: Intact distal pulses.   Pulmonary/Chest: Effort normal.  Musculoskeletal: Normal range of motion.  Neurological: He is alert.  Skin: Skin is warm.  Nursing note and vitals reviewed.  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 I've recommended distal arthroplasty digit 5 right along with distal lateral exostectomy and patient wants this done and wants to get it done as soon as possible due to schedule. I allowed patient to read consent form going over alternative treatments complications associated with this and the fact that total recovery. Can take 6 months to a year. Patient wants surgery and after extensive review signs consent form and is scheduled for outpatient surgery after instructions given. Encouraged to call with any questions  X-rays indicate that there is significant rotation digit 5 right with distal lateral exostosis

## 2016-12-18 NOTE — Patient Instructions (Addendum)
Pre-Operative Instructions  Congratulations, you have decided to take an important step towards improving your quality of life.  You can be assured that the doctors and staff at [facility] will be with you every step of the way.  Here are some important things you should know:  1. Plan to be at the surgery center/hospital at least 1 (one) hour prior to your scheduled time, unless otherwise directed by the surgical center/hospital staff.  You must have a responsible adult accompany you, remain during the surgery and drive you home.  Make sure you have directions to the surgical center/hospital to ensure you arrive on time. 2. If you are having surgery at [facility] or [facility], you will need a copy of your medical history and physical form from your family physician within one month prior to the date of surgery. We will give you a form for your primary physician to complete.  3. We make every effort to accommodate the date you request for surgery.  However, there are times where surgery dates or times have to be moved.  We will contact you as soon as possible if a change in schedule is required.   4. No aspirin/ibuprofen for one week before surgery.  If you are on aspirin, any non-steroidal anti-inflammatory medications (Mobic, Aleve, Ibuprofen) should not be taken seven (7) days prior to your surgery.  You make take Tylenol for pain prior to surgery.  5. Medications - If you are taking daily heart and blood pressure medications, seizure, reflux, allergy, asthma, anxiety, pain or diabetes medications, make sure you notify the surgery center/hospital before the day of surgery so they can tell you which medications you should take or avoid the day of surgery. 6. No food or drink after midnight the night before surgery unless directed otherwise by surgical center/hospital staff. 7. No alcoholic beverages 24-hours prior to surgery.  No smoking 24-hours prior or 24-hours after  surgery. 8. Wear loose pants or shorts. They should be loose enough to fit over bandages, boots, and casts. 9. Don't wear slip-on shoes. Sneakers are preferred. 10. Bring your boot with you to the surgery center/hospital.  Also bring crutches or a walker if your physician has prescribed it for you.  If you do not have this equipment, it will be provided for you after surgery. 11. If you have not been contacted by the surgery center/hospital by the day before your surgery, call to confirm the date and time of your surgery. 12. Leave-time from work may vary depending on the type of surgery you have.  Appropriate arrangements should be made prior to surgery with your employer. 13. Prescriptions will be provided immediately following surgery by your doctor.  Fill these as soon as possible after surgery and take the medication as directed. Pain medications will not be refilled on weekends and must be approved by the doctor. 14. Remove nail polish on the operative foot and avoid getting pedicures prior to surgery. 15. Wash the night before surgery.  The night before surgery wash the foot and leg well with water and the antibacterial soap provided. Be sure to pay special attention to beneath the toenails and in between the toes.  Wash for at least three (3) minutes. Rinse thoroughly with water and dry well with a towel.  Perform this wash unless told not to do so by your physician.  Enclosed: 1 Ice pack (please put in freezer the night before surgery)   1 Hibiclens skin cleaner     Pre-op instructions  If you have any questions regarding the instructions, please do not hesitate to call our office.  [place]: [address], [place], [place] [address] -- [phone]  [place]: [address] [place], [place] [address] -- [phone]  [place]: [address]  [place],  [address] -- [phone]  [place]: [address], [address], [place],  [address] -- [phone]  Website:  [email or web address] 

## 2016-12-23 ENCOUNTER — Encounter: Payer: Self-pay | Admitting: Podiatry

## 2016-12-23 DIAGNOSIS — M2041 Other hammer toe(s) (acquired), right foot: Secondary | ICD-10-CM | POA: Diagnosis not present

## 2016-12-23 DIAGNOSIS — M25774 Osteophyte, right foot: Secondary | ICD-10-CM | POA: Diagnosis not present

## 2016-12-23 DIAGNOSIS — I10 Essential (primary) hypertension: Secondary | ICD-10-CM | POA: Diagnosis not present

## 2016-12-31 ENCOUNTER — Ambulatory Visit (INDEPENDENT_AMBULATORY_CARE_PROVIDER_SITE_OTHER): Payer: Medicare Other | Admitting: Podiatry

## 2016-12-31 ENCOUNTER — Ambulatory Visit (INDEPENDENT_AMBULATORY_CARE_PROVIDER_SITE_OTHER): Payer: Medicare Other

## 2016-12-31 ENCOUNTER — Encounter: Payer: Self-pay | Admitting: Podiatry

## 2016-12-31 DIAGNOSIS — D169 Benign neoplasm of bone and articular cartilage, unspecified: Secondary | ICD-10-CM | POA: Diagnosis not present

## 2016-12-31 DIAGNOSIS — M2041 Other hammer toe(s) (acquired), right foot: Secondary | ICD-10-CM

## 2016-12-31 NOTE — Progress Notes (Signed)
Subjective:    Patient ID: James [name], male   DOB: 71 y.o.   MRN: [id number]   HPI patient states doing real well with his toe    ROS      Objective:  Physical Exam neurovascular status intact with patient's fifth digit doing very well with good alignment with wound edges well coapted stitches in place     Assessment:     Doing well post arthroplasty exostectomy    Plan:    Reviewed x-rays and allow patient to gradually get foot wet but continue compression and immobilization and be seen back in 1-2 weeks for stitch removal or earlier if needed  X-ray indicated satisfactory section of bone

## 2017-01-07 ENCOUNTER — Encounter: Payer: Self-pay | Admitting: Podiatry

## 2017-01-07 ENCOUNTER — Ambulatory Visit (INDEPENDENT_AMBULATORY_CARE_PROVIDER_SITE_OTHER): Payer: Medicare Other | Admitting: Podiatry

## 2017-01-07 DIAGNOSIS — D169 Benign neoplasm of bone and articular cartilage, unspecified: Secondary | ICD-10-CM

## 2017-01-07 DIAGNOSIS — M2041 Other hammer toe(s) (acquired), right foot: Secondary | ICD-10-CM

## 2017-01-08 NOTE — Progress Notes (Signed)
Subjective:    Patient ID: James [name], male   DOB: 71 y.o.   MRN: [id number]   HPI patient states doing very well and is starting to wear shoe gear    ROS      Objective:  Physical Exam neurovascular status intact with patient's fifth digit right healing well wound edges well coapted good alignment noted     Assessment:    Doing well post arthroplasty exostectomy fifth digit right     Plan:    Stitches removed wound edges coapted well and advised on gradual return to soft shoes and reappoint on an as-needed basis

## 2017-01-14 DIAGNOSIS — H547 Unspecified visual loss: Secondary | ICD-10-CM | POA: Diagnosis not present

## 2017-01-14 DIAGNOSIS — F339 Major depressive disorder, recurrent, unspecified: Secondary | ICD-10-CM | POA: Diagnosis not present

## 2017-01-14 DIAGNOSIS — I10 Essential (primary) hypertension: Secondary | ICD-10-CM | POA: Diagnosis not present

## 2017-04-15 DIAGNOSIS — H52201 Unspecified astigmatism, right eye: Secondary | ICD-10-CM | POA: Diagnosis not present

## 2017-04-15 DIAGNOSIS — H2513 Age-related nuclear cataract, bilateral: Secondary | ICD-10-CM | POA: Diagnosis not present

## 2017-04-15 DIAGNOSIS — H401133 Primary open-angle glaucoma, bilateral, severe stage: Secondary | ICD-10-CM | POA: Diagnosis not present

## 2017-04-15 DIAGNOSIS — H524 Presbyopia: Secondary | ICD-10-CM | POA: Diagnosis not present

## 2017-04-15 DIAGNOSIS — H548 Legal blindness, as defined in USA: Secondary | ICD-10-CM | POA: Diagnosis not present

## 2017-04-15 DIAGNOSIS — H5211 Myopia, right eye: Secondary | ICD-10-CM | POA: Diagnosis not present

## 2017-05-25 ENCOUNTER — Encounter: Payer: Self-pay | Admitting: Adult Health

## 2017-05-25 ENCOUNTER — Ambulatory Visit (INDEPENDENT_AMBULATORY_CARE_PROVIDER_SITE_OTHER): Payer: Medicare Other | Admitting: Adult Health

## 2017-05-25 VITALS — BP 156/84 | HR 79 | Ht 73.0 in | Wt 229.5 lb

## 2017-05-25 DIAGNOSIS — G20 Parkinson's disease: Secondary | ICD-10-CM

## 2017-05-25 MED ORDER — CARBIDOPA-LEVODOPA 25-100 MG PO TABS: 1.5000 | ORAL_TABLET | Freq: Three times a day (TID) | ORAL | 11 refills | Status: DC

## 2017-05-25 NOTE — Progress Notes (Addendum)
PATIENT: James [name] DOB: 1945/03/04  REASON FOR VISIT: follow up HISTORY FROM: patient  HISTORY OF PRESENT ILLNESS: Today 05/25/17 James [name] is a 72 year old male with a history of Parkinson's disease.  He returns today for follow-up.  He reports that he is doing fairly well.  Continues on Sinemet 1-1/2 tablets 3 times a day.  He reports that he has a tremor in the right hand that may have gotten slightly worse.  In regards to his gait or balance he reports that he has had more episodes of tripping but no falls.  He states that he is trying to continue doing the exercises he learned in physical therapy.  Denies any changes with the bowels or bladder.  Denies any changes with swallowing or chewing food.  Reports trouble sleeping.  Tends to go to bed around 4 AM and wake up at 8 AM and naps frequently throughout the day.  He states that he tries to stay well-hydrated with water.  Returns today for evaluation.  HISTORY 11/24/2016( Copied from James [name] Notes): He reports the increase in C/L to 1 1/2 pills tid helped, some intermittent tremor on the L. Tries to hydrate, no constipation, no recent falls, has to be careful coming down stairs. No sinister memory concerns.   [name], current medications, family history, past medical history, past social history, past surgical history and problem list were reviewed and updated as appropriate.     REVIEW OF SYSTEMS: Out of a complete 14 system review of symptoms, the patient complains only of the following symptoms, and all other reviewed systems are negative.  Fatigue, loss of vision, blurred vision, wheezing, shortness of breath, runny nose, drooling, chest pain, apnea, frequent waking, daytime sleepiness, sleep talking, moles, itching, neck stiffness, walking difficulty, back pain in the urine decrease, dizziness, weakness, tremors, confusion, decreased concentration, depression, nervous/anxious  ALLERGIES: Allergies    Allergen Reactions  . Codeine Itching  . Morphine Itching    HOME MEDICATIONS: Outpatient Medications Prior to Visit  Medication Sig Dispense Refill  . albuterol (PROVENTIL) (2.5 MG/3ML) 0.083% nebulizer solution TAKE 3 MLS VIA NEBULIZER EVERY 6 HRS AS NEEDED FOR WHEEZING OR SHORTNESS OF BREATH 360 mL 0  . atorvastatin (LIPITOR) 20 MG tablet Take 20 mg by mouth at bedtime.     Marland Kitchen buPROPion (WELLBUTRIN XL) 300 MG 24 hr tablet Take 300 mg by mouth daily.     . calcium-vitamin D (OSCAL WITH D) 250-125 MG-UNIT per tablet Take 1 tablet by mouth daily.    . carbidopa-levodopa (SINEMET IR) 25-100 MG tablet Take 1.5 tablets by mouth 3 (three) times daily. 135 tablet 6  . cetirizine (ZYRTEC) 10 MG tablet Take 10 mg by mouth daily.    . ferrous sulfate 325 (65 FE) MG tablet Take 325 mg by mouth 2 (two) times daily with a meal.     . Green Tea 315 MG CAPS Take 315 mg by mouth daily.    . hydrochlorothiazide (HYDRODIURIL) 25 MG tablet Take 25 mg by mouth daily.     Marland Kitchen MEGARED OMEGA-3 KRILL OIL PO Take 1 capsule by mouth daily.    . metFORMIN (GLUCOPHAGE) 500 MG tablet Take 1,000 mg by mouth 2 (two) times daily with a meal.     . Multiple Vitamins-Minerals (MULTIVITAMIN WITH MINERALS) tablet Take 1 tablet by mouth daily.      Marland Kitchen omeprazole (PRILOSEC OTC) 20 MG tablet Take 20 mg by mouth daily with breakfast.    .  sertraline (ZOLOFT) 100 MG tablet Take 100 mg by mouth daily.     . vardenafil (LEVITRA) 20 MG tablet Take 20 mg by mouth daily as needed for erectile dysfunction.      No facility-administered medications prior to visit.     PAST MEDICAL HISTORY: Past Medical History:  Diagnosis Date  . Allergy    seasonal  . Blind left eye    legally  . Blood transfusion    2002  . COPD (chronic obstructive pulmonary disease) ([place])   . Depression   . Diabetes mellitus, type 2 ([place])   . Diverticulosis   . ED (erectile dysfunction)   . Glaucoma   . Hyperlipidemia   . Hypertension   . Insomnia    . Internal and external bleeding hemorrhoids   . Iron deficiency anemia   . OSA (obstructive sleep apnea)   . Osteoarthritis   . Rosacea   . Tremor of both hands   . Tubular adenoma of colon 03/2011    PAST SURGICAL HISTORY: Past Surgical History:  Procedure Laterality Date  . ARTHROSCOPIC REPAIR ACL    . CHOLECYSTECTOMY    . [place]   right  . COLONOSCOPY WITH PROPOFOL N/A 07/25/2015   Procedure: COLONOSCOPY WITH PROPOFOL;  Surgeon: James Artist, MD;  Location: [facility];  Service: Endoscopy;  Laterality: N/A;  . ESOPHAGOGASTRODUODENOSCOPY (EGD) WITH PROPOFOL N/A 07/25/2015   Procedure: ESOPHAGOGASTRODUODENOSCOPY (EGD) WITH PROPOFOL;  Surgeon: James Artist, MD;  Location: [facility];  Service: Endoscopy;  Laterality: N/A;  . KNEE ARTHROSCOPY  1996  . PARTIAL COLECTOMY  2008  . THYROID CYST EXCISION    . TONSILLECTOMY      FAMILY HISTORY: Family History  Problem Relation Age of Onset  . CAD Father   . Alcohol abuse Father   . Heart disease Father   . COPD Mother   . Asthma Mother   . Emphysema Mother   . Cancer Maternal Grandmother   . Colon cancer Neg Hx     SOCIAL HISTORY: Social History   Socioeconomic History  . Marital status: Married    Spouse name: Not on file  . Number of children: 1  . Years of education: HS  . Highest education level: Not on file  Occupational History  . Occupation: retired    Fish farm manager: RETIRED  Social Needs  . Financial resource strain: Not on file  . Food insecurity:    Worry: Not on file    Inability: Not on file  . Transportation needs:    Medical: Not on file    Non-medical: Not on file  Tobacco Use  . Smoking status: Former Smoker    Packs/day: 1.50    Years: 50.00    Pack years: 75.00    Types: Cigarettes    Last attempt to quit: 04/29/2013    Years since quitting: 4.0  . Smokeless tobacco: Never Used  Substance and Sexual Activity  . Alcohol use: Yes    Alcohol/week: 0.0 oz     Comment: once yearly  . Drug use: No  . Sexual activity: Not on file  Lifestyle  . Physical activity:    Days per week: Not on file    Minutes per session: Not on file  . Stress: Not on file  Relationships  . Social connections:    Talks on phone: Not on file    Gets together: Not on file    Attends religious service: Not on file  Active member of club or organization: Not on file    Attends meetings of clubs or organizations: Not on file    Relationship status: Not on file  . Intimate partner violence:    Fear of current or ex partner: Not on file    Emotionally abused: Not on file    Physically abused: Not on file    Forced sexual activity: Not on file  Other Topics Concern  . Not on file  Social History Narrative   Drinks 1-2 Peps[employer] a day       PHYSICAL EXAM  Vitals:   05/25/17 1406  BP: (!) 156/84  Pulse: 79  Weight: 229 lb 8 oz (104.1 kg)  Height: '6\' 1"'$  (1.854 m)   Body mass index is 30.28 kg/m.  Generalized: Well developed, in no acute distress   Neurological examination  Mentation: Alert oriented to time, place, history taking. Follows all commands speech and language fluent Cranial nerve II-XII: Pupils were equal round reactive to light. Extraocular movements were full, visual field were full on confrontational test. Facial sensation and strength were normal. Uvula tongue midline. Head turning and shoulder shrug  were normal and symmetric. Motor: The motor testing reveals 5 over 5 strength of all 4 extremities. Good symmetric motor tone is noted throughout.  Resting tremor noted in the right hand Sensory: Sensory testing is intact to soft touch on all 4 extremities. No evidence of extinction is noted.  Coordination: Cerebellar testing reveals good finger-nose-finger and heel-to-shin bilaterally.  Gait and station: Gait is normal. Tandem gait is unsteady Romberg is negative  But unsteady tends to lean backwards Reflexes: Deep tendon reflexes are symmetric and  normal bilaterally.   DIAGNOSTIC DATA (LABS, IMAGING, TESTING) - I reviewed patient records, labs, notes, testing and imaging myself where available.  Lab Results  Component Value Date   WBC 7.4 02/08/2008   HGB 16.8 02/08/2008   HCT 48.2 02/08/2008   MCV 92.3 02/08/2008   PLT 187 02/08/2008      Component Value Date/Time   NA 141 03/18/2013 1422   K 3.9 03/18/2013 1422   CL 102 03/18/2013 1422   CO2 33 (H) 03/18/2013 1422   GLUCOSE 130 (H) 03/18/2013 1422   BUN 14 03/18/2013 1422   CREATININE 1.1 03/18/2013 1422   CALCIUM 9.4 03/18/2013 1422   GFRNONAA >60 02/08/2008 1431   GFRAA  02/08/2008 1431    >60        The eGFR has been calculated using the MDRD equation. This calculation has not been validated in all clinical      ASSESSMENT AND PLAN 72 y.o. year old male  has a past medical history of Allergy, Blind left eye, Blood transfusion, COPD (chronic obstructive pulmonary disease) ([place]), Depression, Diabetes mellitus, type 2 ([place]), Diverticulosis, ED (erectile dysfunction), Glaucoma, Hyperlipidemia, Hypertension, Insomnia, Internal and external bleeding hemorrhoids, Iron deficiency anemia, OSA (obstructive sleep apnea), Osteoarthritis, Rosacea, Tremor of both hands, and Tubular adenoma of colon (03/2011). here with :  1.  Parkinson's disease  Overall the patient has remained stable.  He will continue on Sinemet 1-1/2 tablets 3 times a day.  He should continue doing the exercises he learned from physical therapy as well as staying well-hydrated.  He is advised that if his symptoms worsen or he develops new symptoms he should let us know.  He will follow-up in 6 months or sooner if needed.  I spent 15 minutes with the patient. 50% of this time was spent discussing his  medication   Ward Givens, MSN, NP-C 05/25/2017, 2:28 PM [facility] [address] [place], [place] [address] [phone]  I reviewed the above note and documentation by the  Nurse Practitioner and agree with the history, physical exam, assessment and plan as outlined above. I was immediately available for face-to-face consultation. Star Age, MD, PhD [facility])

## 2017-05-25 NOTE — Patient Instructions (Signed)
Your Plan:  Continue Sinemet If your symptoms worsen or you develop new symptoms please let us know.   Thank you for coming to see [place] at [facility]. I hope we have been able to provide you high quality care today.  You may receive a patient satisfaction survey over the next few weeks. We would appreciate your feedback and comments so that we may continue to improve ourselves and the health of our patients.

## 2017-06-01 ENCOUNTER — Ambulatory Visit (INDEPENDENT_AMBULATORY_CARE_PROVIDER_SITE_OTHER)
Admission: RE | Admit: 2017-06-01 | Discharge: 2017-06-01 | Disposition: A | Discharge status: Home / Self Care | Payer: Medicare Other | Source: Ambulatory Visit | Attending: Pulmonary Disease | Admitting: Pulmonary Disease

## 2017-06-01 DIAGNOSIS — R911 Solitary pulmonary nodule: Secondary | ICD-10-CM | POA: Diagnosis not present

## 2017-06-01 IMAGING — CT CT CHEST W/O CM
2 of 3 series · 15 of 36 positions shown, 18 images · non-contrast
Comparison: [DATE] and [DATE]

CLINICAL DATA: Follow-up indeterminate pulmonary nodules.
Emphysema.

EXAM:
CT CHEST WITHOUT CONTRAST
TECHNIQUE: Multidetector CT imaging of the chest was performed following the
standard protocol without IV contrast.

[Series 2: thorax · axial · 0.85mm/px · z∈[-352,-60]mm · 12 of 172 slices shown, 15 images]
[im 13/172  mediastinal]
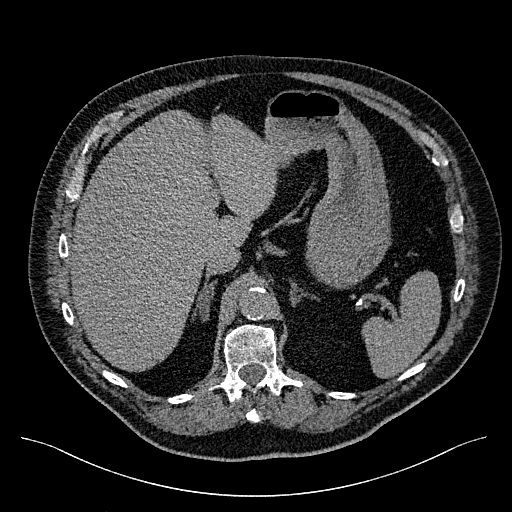
[im 13/172  lung]
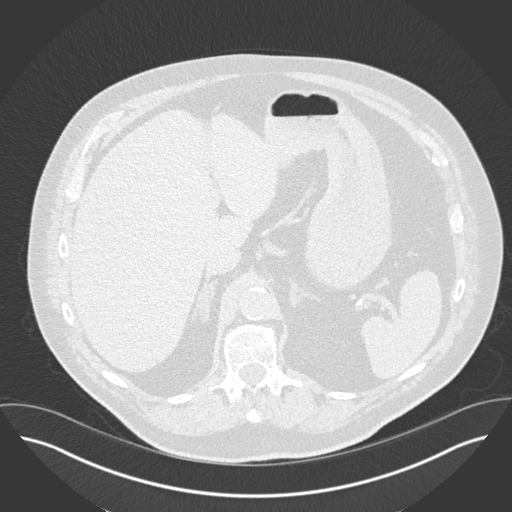
[im 26/172  lung]
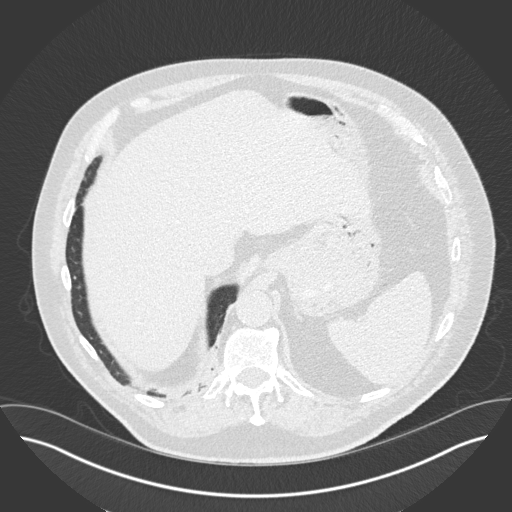
[im 39/172  lung]
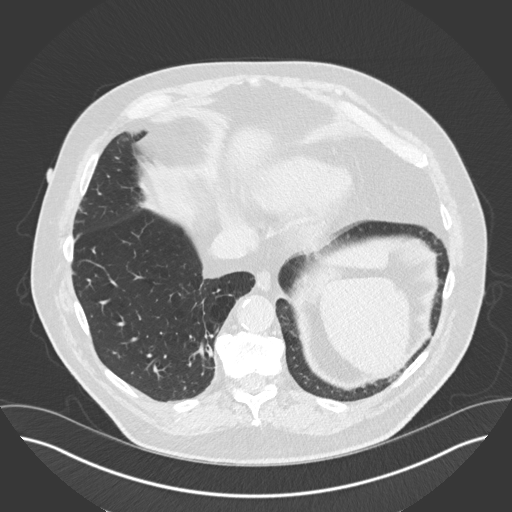
[im 51/172  lung]
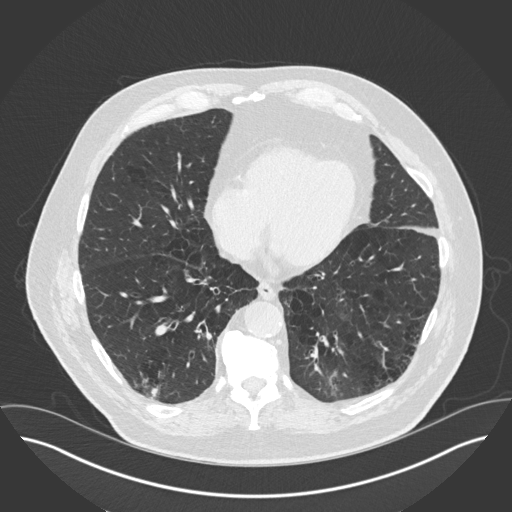
[im 64/172  mediastinal]
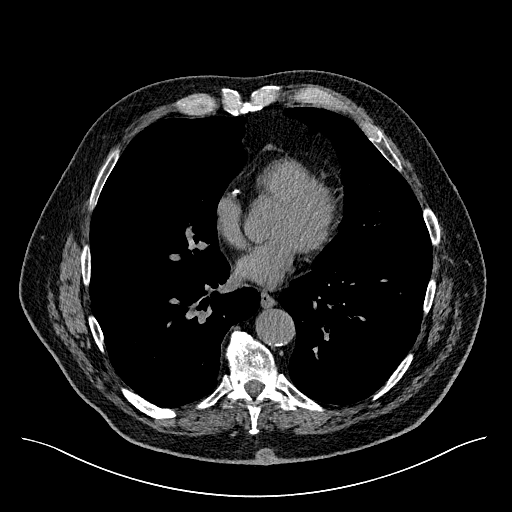
[im 64/172  lung]
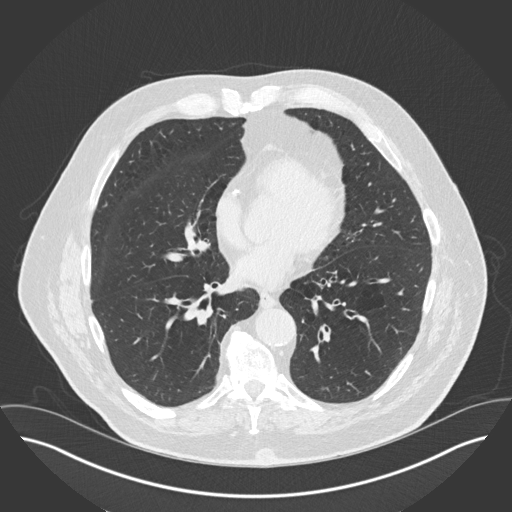
[im 77/172  lung]
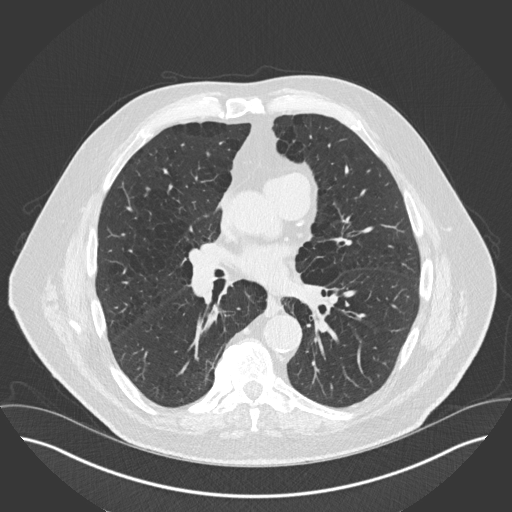
[im 96/172  lung]
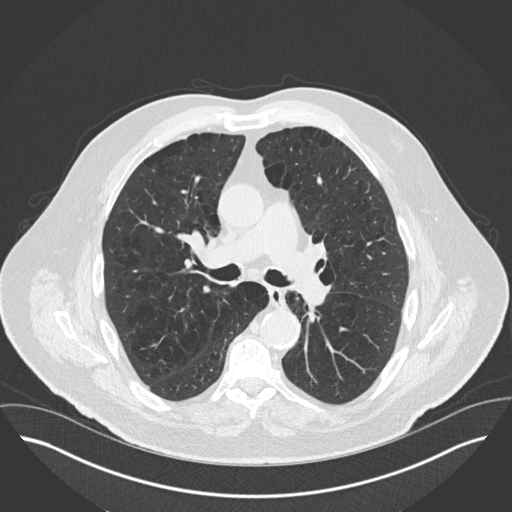
[im 108/172  lung]
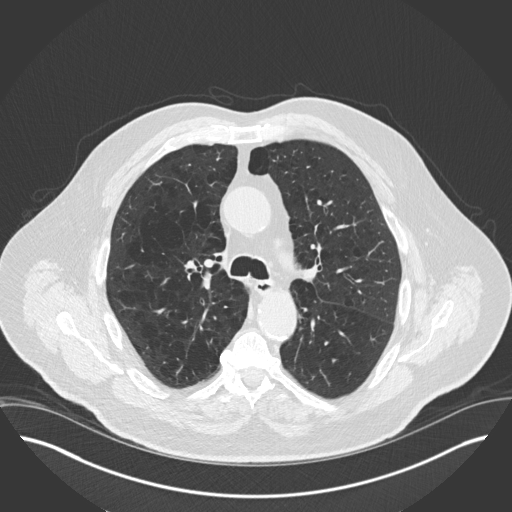
[im 121/172  mediastinal]
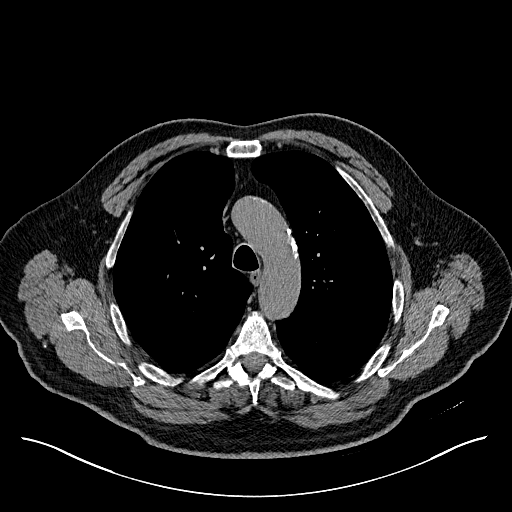
[im 121/172  lung]
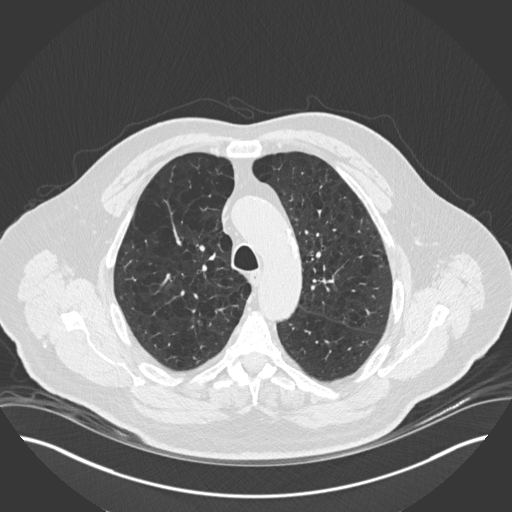
[im 134/172  lung]
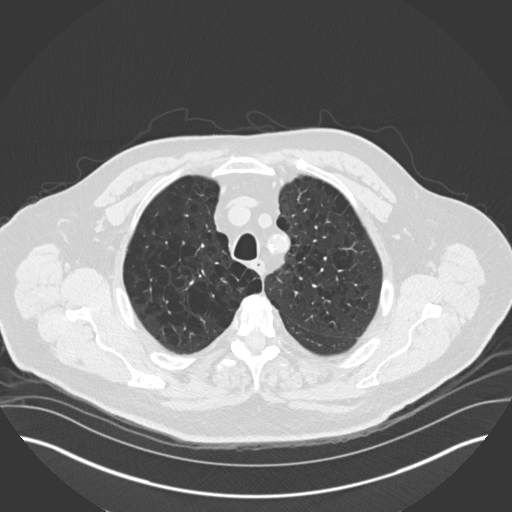
[im 146/172  lung]
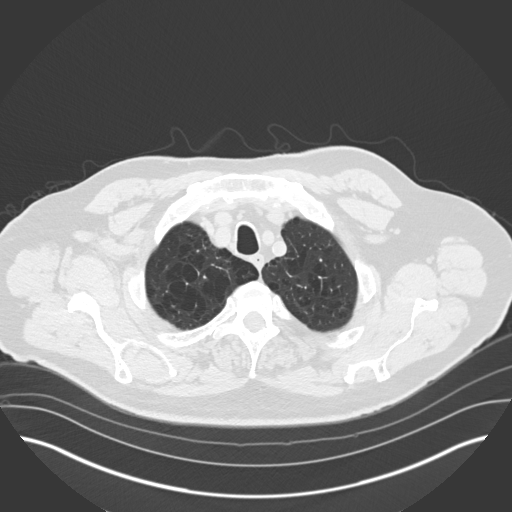
[im 159/172  lung]
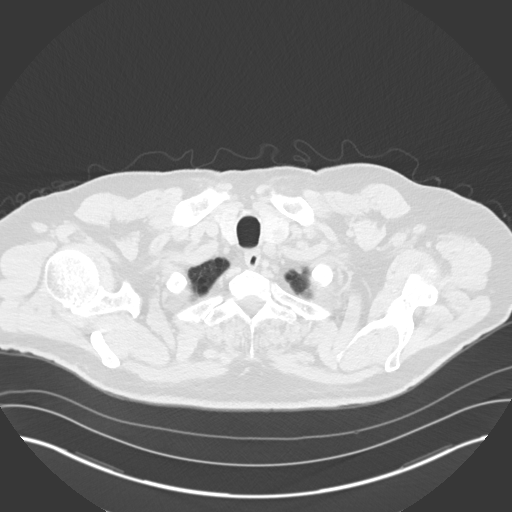

[Series 5: coronal · coronal · 0.67mm/px · 3 of 155 slices shown]
[im 31/155  lung]
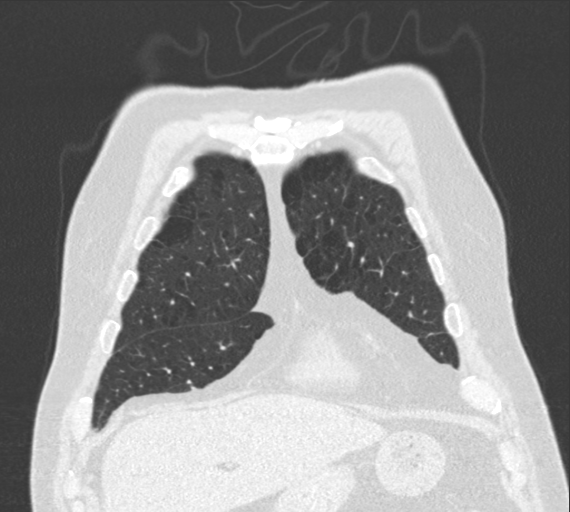
[im 62/155  lung]
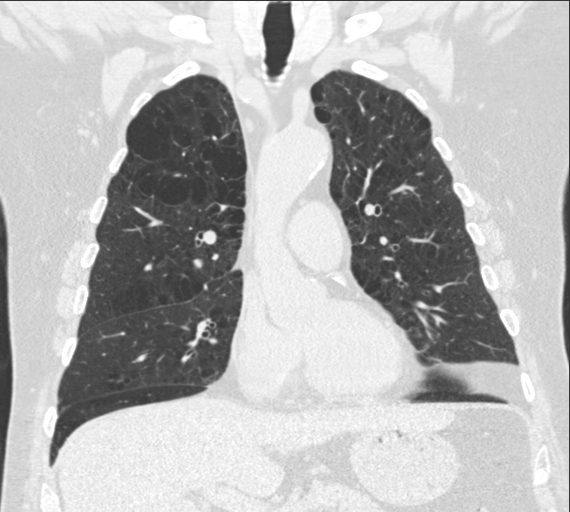
[im 93/155  lung]
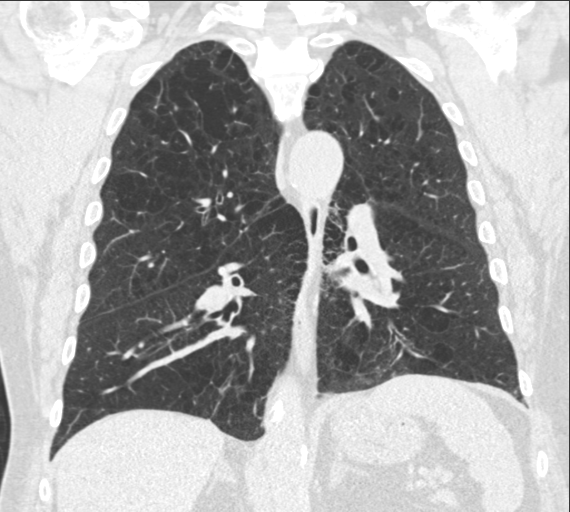

[15 of 36 positions shown; findings below may reference images not displayed]

FINDINGS: Cardiovascular: No acute findings. Aortic and coronary artery
atherosclerosis.

Mediastinum/Nodes: No masses or pathologically enlarged lymph nodes
identified on this unenhanced exam. Stable 1.2 cm low-attenuation
left adrenal nodule.

Lungs/Pleura: Moderate emphysema and mild bibasilar bronchiectasis
again noted. Irregular pulmonary nodule in the posterior right lower
lobe measures 12 x 7 mm on image 125/3, without significant change
compared to prior exams. No new or enlarging pulmonary nodules or
masses identified. No evidence of acute infiltrate or pleural
effusion.

Upper Abdomen:  Unremarkable.

Musculoskeletal:  No suspicious bone lesions.
IMPRESSION: Stable posterior right lower lobe pulmonary nodule. Recommend
continued follow-up by chest CT without contrast in 12 months.

Aortic Atherosclerosis ([DC]-[DC]) and Emphysema ([DC]-[DC]).
Coronary artery calcification.

## 2017-06-03 ENCOUNTER — Ambulatory Visit (INDEPENDENT_AMBULATORY_CARE_PROVIDER_SITE_OTHER): Payer: Medicare Other | Admitting: Pulmonary Disease

## 2017-06-03 ENCOUNTER — Encounter: Payer: Self-pay | Admitting: Pulmonary Disease

## 2017-06-03 VITALS — BP 138/82 | HR 80 | Ht 73.0 in | Wt 231.8 lb

## 2017-06-03 DIAGNOSIS — R058 Other specified cough: Secondary | ICD-10-CM

## 2017-06-03 DIAGNOSIS — R05 Cough: Secondary | ICD-10-CM | POA: Diagnosis not present

## 2017-06-03 DIAGNOSIS — R911 Solitary pulmonary nodule: Secondary | ICD-10-CM

## 2017-06-03 DIAGNOSIS — J432 Centrilobular emphysema: Secondary | ICD-10-CM

## 2017-06-03 DIAGNOSIS — J9611 Chronic respiratory failure with hypoxia: Secondary | ICD-10-CM

## 2017-06-03 DIAGNOSIS — R918 Other nonspecific abnormal finding of lung field: Secondary | ICD-10-CM

## 2017-06-03 NOTE — Patient Instructions (Signed)
Will schedule CT chest for April 2020 and follow up after that

## 2017-06-03 NOTE — Progress Notes (Signed)
[facility]  Chief Complaint  Patient presents with  . Follow-up    Chest CT f/u done 06/01/17    Vital signs: BP 138/82 (BP Location: Left Arm, Cuff Size: Normal)   Pulse 80   Ht 6\' 1"  (1.854 m)   Wt 231 lb 12.8 oz (105.1 kg)   SpO2 97%   BMI 30.58 kg/m   History of Present Illness: James [name] is a 72 y.o. male former smoker with COPD/emphysema, chronic hypoxic respiratory failure on supplemental oxygen at night and exertion, and pulmonary nodule.  He had CT chest earlier this month.  No change in lung nodule.  No other new findings.  He is not having cough, wheeze, sputum, fever, chest pain, or hemoptysis.  Uses albuterol nebulizer about once per week.  Not having sinus congestion, sore throat, sneezing.  Not having leg swelling.  Keeps up with routine activity.  Uses oxygen at night.   Physical Exam:  General - pleasant Eyes - pupils reactive ENT - no sinus tenderness, no oral exudate, no LAN Cardiac - regular, no murmur Chest - no wheeze, rales Abd - soft, non tender Ext - no edema Skin - no rashes Neuro - normal strength Psych - normal mood   Assessment/Plan:  COPD with emphysema. - didn't have benefit from LAMA, LABA, or ICS - continue prn albuterol  Chronic respiratory failure with nocturnal hypoxemia from COPD. - continue 3 liters oxygen at night  Right lower lung mass. 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 - prn OTC antihistamine    Patient Instructions  Will schedule CT chest for April 2020 and follow up after that    Chesley Mires, MD [place] 06/03/2017, 12:28 PM  Flow Sheet  Pulmonary tests: Spirometry 06/08/12 >> FEV1 1.91 (49%), FEV1% 59 ONO with RA 06/16/12 >> Test time 9 hrs 21 min. Mean SpO2 89%, low SpO2 63%. Spent 2 hrs 25 min with SpO2 < 88% 07/07/12 >>  SpO2 86% on room air with exertion >> start 2 liters oxygen with exertion and sleep A1AT 07/07/12 >> 154, PI-MM  Chest imaging CT chest 03/23/13 >> mild/diffuse centrilobular emphysema, 4 mm nodule RLL superior segment, 4 mm nodule LLL CT chest 02/20/14 >> moderate centrilobular/paraseptal emphysema, mild diffuse bronchial wall thickening, 3 mm nodule RLL and 5 mm nodule LLL no change CT chest 02/13/15 >> new 1.5 cm nodule RLL. CT chest 05/28/15 >> increased RLL irregular nodule to 2 cm PET scan 06/14/15 >> 1.93 SUV RLL 11 mm lesion CT chest 11/13/15 >> no progression CT chest 05/28/16 >> no change in lung nodule CT chest 06/01/17 >> no change  Sleep tests: PSG 01/25/08 >> RDI 6.8, SpO2 low 84%. Spent 79% of test time with SpO2 < 90%   Past Medical History: He  has a past medical history of Allergy, Blind left eye, Blood transfusion, COPD (chronic obstructive pulmonary disease) ([place]), Depression, Diabetes mellitus, type 2 ([place]), Diverticulosis, ED (erectile dysfunction), Glaucoma, Hyperlipidemia, Hypertension, Insomnia, Internal and external bleeding hemorrhoids, Iron deficiency anemia, OSA (obstructive sleep apnea), Osteoarthritis, Rosacea, Tremor of both hands, and Tubular adenoma of colon (03/2011).  Past Surgical History: He  has a past surgical history that includes Partial colectomy (2008); Clavicle surgery ([place]); Knee arthroscopy (1996); Cholecystectomy; Tonsillectomy; Thyroid cyst excision; Arthroscopic repair ACL; Esophagogastroduodenoscopy (egd) with propofol (N/A, 07/25/2015); and Colonoscopy  with propofol (N/A, 07/25/2015).  Family History: His family history includes Alcohol abuse in his father; Asthma in his mother; CAD in his father; COPD in his mother; Cancer in his maternal grandmother; Emphysema in his mother; Heart disease in his father.  Social History: He  reports that he quit smoking about 4 years ago. His smoking use included cigarettes. He has a 75.00 pack-year  smoking history. He has never used smokeless tobacco. He reports that he drinks alcohol. He reports that he does not use drugs.  Medications: Allergies as of 06/03/2017      Reactions   Codeine Itching   Morphine Itching      Medication List        Accurate as of 06/03/17 12:28 PM. Always use your most recent med list.          albuterol (2.5 MG/3ML) 0.083% nebulizer solution Commonly known as:  PROVENTIL TAKE 3 MLS VIA NEBULIZER EVERY 6 HRS AS NEEDED FOR WHEEZING OR SHORTNESS OF BREATH   atorvastatin 20 MG tablet Commonly known as:  LIPITOR Take 20 mg by mouth at bedtime.   buPROPion 300 MG 24 hr tablet Commonly known as:  WELLBUTRIN XL Take 300 mg by mouth daily.   calcium-vitamin D 250-125 MG-UNIT tablet Commonly known as:  OSCAL WITH D Take 1 tablet by mouth daily.   carbidopa-levodopa 25-100 MG tablet Commonly known as:  SINEMET IR Take 1.5 tablets by mouth 3 (three) times daily.   cetirizine 10 MG tablet Commonly known as:  ZYRTEC Take 10 mg by mouth daily.   ferrous sulfate 325 (65 FE) MG tablet Take 325 mg by mouth 2 (two) times daily with a meal.   Green Tea 315 MG Caps Take 315 mg by mouth daily.   hydrochlorothiazide 25 MG tablet Commonly known as:  HYDRODIURIL Take 25 mg by mouth daily.   MEGARED OMEGA-3 KRILL OIL PO Take 1 capsule by mouth daily.   metFORMIN 500 MG tablet Commonly known as:  GLUCOPHAGE Take 1,000 mg by mouth 2 (two) times daily with a meal.   multivitamin with minerals tablet Take 1 tablet by mouth daily.   omeprazole 20 MG tablet Commonly known as:  PRILOSEC OTC Take 20 mg by mouth daily with breakfast.   sertraline 100 MG tablet Commonly known as:  ZOLOFT Take 100 mg by mouth daily.   vardenafil 20 MG tablet Commonly known as:  LEVITRA Take 20 mg by mouth daily as needed for erectile dysfunction.

## 2017-08-13 DIAGNOSIS — E7800 Pure hypercholesterolemia, unspecified: Secondary | ICD-10-CM | POA: Diagnosis not present

## 2017-08-13 DIAGNOSIS — N390 Urinary tract infection, site not specified: Secondary | ICD-10-CM | POA: Diagnosis not present

## 2017-08-13 DIAGNOSIS — E119 Type 2 diabetes mellitus without complications: Secondary | ICD-10-CM | POA: Diagnosis not present

## 2017-08-13 DIAGNOSIS — Z125 Encounter for screening for malignant neoplasm of prostate: Secondary | ICD-10-CM | POA: Diagnosis not present

## 2017-08-13 DIAGNOSIS — I10 Essential (primary) hypertension: Secondary | ICD-10-CM | POA: Diagnosis not present

## 2017-08-19 DIAGNOSIS — Z6832 Body mass index (BMI) 32.0-32.9, adult: Secondary | ICD-10-CM | POA: Diagnosis not present

## 2017-08-19 DIAGNOSIS — Z0000 Encounter for general adult medical examination without abnormal findings: Secondary | ICD-10-CM | POA: Diagnosis not present

## 2017-08-19 DIAGNOSIS — E7800 Pure hypercholesterolemia, unspecified: Secondary | ICD-10-CM | POA: Diagnosis not present

## 2017-08-19 DIAGNOSIS — E119 Type 2 diabetes mellitus without complications: Secondary | ICD-10-CM | POA: Diagnosis not present

## 2017-08-19 DIAGNOSIS — N200 Calculus of kidney: Secondary | ICD-10-CM | POA: Diagnosis not present

## 2017-08-19 DIAGNOSIS — N400 Enlarged prostate without lower urinary tract symptoms: Secondary | ICD-10-CM | POA: Diagnosis not present

## 2017-08-19 DIAGNOSIS — D500 Iron deficiency anemia secondary to blood loss (chronic): Secondary | ICD-10-CM | POA: Diagnosis not present

## 2017-08-19 DIAGNOSIS — I10 Essential (primary) hypertension: Secondary | ICD-10-CM | POA: Diagnosis not present

## 2017-08-19 DIAGNOSIS — L719 Rosacea, unspecified: Secondary | ICD-10-CM | POA: Diagnosis not present

## 2017-08-19 DIAGNOSIS — J449 Chronic obstructive pulmonary disease, unspecified: Secondary | ICD-10-CM | POA: Diagnosis not present

## 2017-08-19 DIAGNOSIS — F339 Major depressive disorder, recurrent, unspecified: Secondary | ICD-10-CM | POA: Diagnosis not present

## 2017-08-19 DIAGNOSIS — M8580 Other specified disorders of bone density and structure, unspecified site: Secondary | ICD-10-CM | POA: Diagnosis not present

## 2017-08-20 DIAGNOSIS — M859 Disorder of bone density and structure, unspecified: Secondary | ICD-10-CM | POA: Diagnosis not present

## 2017-08-20 DIAGNOSIS — M8580 Other specified disorders of bone density and structure, unspecified site: Secondary | ICD-10-CM | POA: Diagnosis not present

## 2017-08-24 ENCOUNTER — Other Ambulatory Visit: Payer: Self-pay | Admitting: Internal Medicine

## 2017-08-24 DIAGNOSIS — I499 Cardiac arrhythmia, unspecified: Secondary | ICD-10-CM

## 2017-09-01 ENCOUNTER — Ambulatory Visit (INDEPENDENT_AMBULATORY_CARE_PROVIDER_SITE_OTHER): Payer: Medicare Other

## 2017-09-01 DIAGNOSIS — I499 Cardiac arrhythmia, unspecified: Secondary | ICD-10-CM | POA: Diagnosis not present

## 2017-11-26 ENCOUNTER — Ambulatory Visit (INDEPENDENT_AMBULATORY_CARE_PROVIDER_SITE_OTHER): Payer: Medicare Other | Admitting: Adult Health

## 2017-11-26 ENCOUNTER — Encounter: Payer: Self-pay | Admitting: Adult Health

## 2017-11-26 VITALS — BP 136/73 | HR 74 | Wt 238.8 lb

## 2017-11-26 DIAGNOSIS — G479 Sleep disorder, unspecified: Secondary | ICD-10-CM | POA: Diagnosis not present

## 2017-11-26 DIAGNOSIS — G20 Parkinson's disease: Secondary | ICD-10-CM

## 2017-11-26 NOTE — Progress Notes (Addendum)
PATIENT: James [name] DOB: 11-09-1945  REASON FOR VISIT: follow up HISTORY FROM: patient  HISTORY OF PRESENT ILLNESS: Today 11/26/17:  James [name] is a 72 year old male with a history of Parkinson's disease.  He returns today for follow-up.  He is currently on Sinemet 1/2 tablet 3 times a day.  He feels overall that his tremor may have gotten slightly worse.  He notices the tremor primarily in the upper extremities and in the left leg. No change in gait he does report that his balance is off.  He states that over the summer he was getting out of the pool and he did fall.  Fortunately did not suffer any injuries.  He does not use a cane or walker.  Denies any trouble swallowing.  Denies eating choked on food or liquids.  He reports that his sleep is fragmented.  He states that he tends to wake up every hour during the night.  For that reason he tends to nap a lot during the day.  He returns today for evaluation.  HISTORY 05/25/17 James [name] is a 72 year old male with a history of Parkinson's disease.  He returns today for follow-up.  He reports that he is doing fairly well.  Continues on Sinemet 1-1/2 tablets 3 times a day.  He reports that he has a tremor in the right hand that may have gotten slightly worse.  In regards to his gait or balance he reports that he has had more episodes of tripping but no falls.  He states that he is trying to continue doing the exercises he learned in physical therapy.  Denies any changes with the bowels or bladder.  Denies any changes with swallowing or chewing food.  Reports trouble sleeping.  Tends to go to bed around 4 AM and wake up at 8 AM and naps frequently throughout the day.  He states that he tries to stay well-hydrated with water.  Returns today for evaluation.  REVIEW OF SYSTEMS: Out of a complete 14 system review of symptoms, the patient complains only of the following symptoms, and all other reviewed systems are negative.  See  HPI  ALLERGIES: Allergies  Allergen Reactions  . Codeine Itching  . Morphine Itching    HOME MEDICATIONS: Outpatient Medications Prior to Visit  Medication Sig Dispense Refill  . albuterol (PROVENTIL) (2.5 MG/3ML) 0.083% nebulizer solution TAKE 3 MLS VIA NEBULIZER EVERY 6 HRS AS NEEDED FOR WHEEZING OR SHORTNESS OF BREATH 360 mL 0  . atorvastatin (LIPITOR) 20 MG tablet Take 20 mg by mouth at bedtime.     Marland Kitchen buPROPion (WELLBUTRIN XL) 300 MG 24 hr tablet Take 300 mg by mouth daily.     . calcium-vitamin D (OSCAL WITH D) 250-125 MG-UNIT per tablet Take 1 tablet by mouth daily.    . carbidopa-levodopa (SINEMET IR) 25-100 MG tablet Take 1.5 tablets by mouth 3 (three) times daily. 135 tablet 11  . cetirizine (ZYRTEC) 10 MG tablet Take 10 mg by mouth daily.    . ferrous sulfate 325 (65 FE) MG tablet Take 325 mg by mouth 2 (two) times daily with a meal.     . Green Tea 315 MG CAPS Take 315 mg by mouth daily.    . hydrochlorothiazide (HYDRODIURIL) 25 MG tablet Take 25 mg by mouth daily.     Marland Kitchen MEGARED OMEGA-3 KRILL OIL PO Take 1 capsule by mouth daily.    . metFORMIN (GLUCOPHAGE) 500 MG tablet Take 1,000 mg by mouth 2 (two)  times daily with a meal.     . Multiple Vitamins-Minerals (MULTIVITAMIN WITH MINERALS) tablet Take 1 tablet by mouth daily.      Marland Kitchen omeprazole (PRILOSEC OTC) 20 MG tablet Take 20 mg by mouth daily with breakfast.    . sertraline (ZOLOFT) 100 MG tablet Take 100 mg by mouth daily.     . vardenafil (LEVITRA) 20 MG tablet Take 20 mg by mouth daily as needed for erectile dysfunction.      No facility-administered medications prior to visit.     PAST MEDICAL HISTORY: Past Medical History:  Diagnosis Date  . Allergy    seasonal  . Blind left eye    legally  . Blood transfusion    2002  . COPD (chronic obstructive pulmonary disease) ([place])   . Depression   . Diabetes mellitus, type 2 ([place])   . Diverticulosis   . ED (erectile dysfunction)   . Glaucoma   . Hyperlipidemia    . Hypertension   . Insomnia   . Internal and external bleeding hemorrhoids   . Iron deficiency anemia   . OSA (obstructive sleep apnea)   . Osteoarthritis   . Rosacea   . Tremor of both hands   . Tubular adenoma of colon 03/2011    PAST SURGICAL HISTORY: Past Surgical History:  Procedure Laterality Date  . ARTHROSCOPIC REPAIR ACL    . CHOLECYSTECTOMY    . [place]   right  . COLONOSCOPY WITH PROPOFOL N/A 07/25/2015   Procedure: COLONOSCOPY WITH PROPOFOL;  Surgeon: [name], MD;  Location: [facility];  Service: Endoscopy;  Laterality: N/A;  . ESOPHAGOGASTRODUODENOSCOPY (EGD) WITH PROPOFOL N/A 07/25/2015   Procedure: ESOPHAGOGASTRODUODENOSCOPY (EGD) WITH PROPOFOL;  Surgeon: [name], MD;  Location: [facility];  Service: Endoscopy;  Laterality: N/A;  . KNEE ARTHROSCOPY  1996  . PARTIAL COLECTOMY  2008  . THYROID CYST EXCISION    . TONSILLECTOMY      FAMILY HISTORY: Family History  Problem Relation Age of Onset  . CAD Father   . Alcohol abuse Father   . Heart disease Father   . COPD Mother   . Asthma Mother   . Emphysema Mother   . Cancer Maternal Grandmother   . Colon cancer Neg Hx     SOCIAL HISTORY: Social History   Socioeconomic History  . Marital status: Married    Spouse name: Not on file  . Number of children: 1  . Years of education: HS  . Highest education level: Not on file  Occupational History  . Occupation: retired    Fish farm manager: RETIRED  Social Needs  . Financial resource strain: Not on file  . Food insecurity:    Worry: Not on file    Inability: Not on file  . Transportation needs:    Medical: Not on file    Non-medical: Not on file  Tobacco Use  . Smoking status: Former Smoker    Packs/day: 1.50    Years: 50.00    Pack years: 75.00    Types: Cigarettes    Last attempt to quit: 04/29/2013    Years since quitting: 4.5  . Smokeless tobacco: Never Used  Substance and Sexual Activity  . Alcohol use: Yes     Alcohol/week: 0.0 standard drinks    Comment: once yearly  . Drug use: No  . Sexual activity: Not on file  Lifestyle  . Physical activity:    Days per week: Not on file  Minutes per session: Not on file  . Stress: Not on file  Relationships  . Social connections:    Talks on phone: Not on file    Gets together: Not on file    Attends religious service: Not on file    Active member of club or organization: Not on file    Attends meetings of clubs or organizations: Not on file    Relationship status: Not on file  . Intimate partner violence:    Fear of current or ex partner: Not on file    Emotionally abused: Not on file    Physically abused: Not on file    Forced sexual activity: Not on file  Other Topics Concern  . Not on file  Social History Narrative   Drinks 1-2 Pepsi a day       PHYSICAL EXAM  Vitals:   11/26/17 0837  BP: 136/73  Pulse: 74  Weight: 238 lb 12.8 oz (108.3 kg)   Body mass index is 31.51 kg/m.  Generalized: Well developed, in no acute distress   Neurological examination  Mentation: Alert oriented to time, place, history taking. Follows all commands speech and language fluent Cranial nerve II-XII: Pupils were equal round reactive to light. Extraocular movements were full, visual field were full on confrontational test. Facial sensation and strength were normal. Uvula tongue midline. Head turning and shoulder shrug  were normal and symmetric. Motor: The motor testing reveals 5 over 5 strength of all 4 extremities. Good symmetric motor tone is noted throughout.  Resting tremor noted primarily in the right upper extremity.  Mild tremor in the left upper extremity. Sensory: Sensory testing is intact to soft touch on all 4 extremities. No evidence of extinction is noted.  Coordination: Cerebellar testing reveals good finger-nose-finger and heel-to-shin bilaterally.  Gait and station: Patient is able to stand without assistance.  Good stride and good  armswing.  Tremor noted in the right hand when ambulating.  Good turns.  Tandem gait unsteady. Reflexes: Deep tendon reflexes are symmetric and normal bilaterally.   DIAGNOSTIC DATA (LABS, IMAGING, TESTING) - I reviewed patient records, labs, notes, testing and imaging myself where available.  Lab Results  Component Value Date   WBC 7.4 02/08/2008   HGB 16.8 02/08/2008   HCT 48.2 02/08/2008   MCV 92.3 02/08/2008   PLT 187 02/08/2008      Component Value Date/Time   NA 141 03/18/2013 1422   K 3.9 03/18/2013 1422   CL 102 03/18/2013 1422   CO2 33 (H) 03/18/2013 1422   GLUCOSE 130 (H) 03/18/2013 1422   BUN 14 03/18/2013 1422   CREATININE 1.1 03/18/2013 1422   CALCIUM 9.4 03/18/2013 1422   GFRNONAA >60 02/08/2008 1431   GFRAA  02/08/2008 1431    >60        The eGFR has been calculated using the MDRD equation. This calculation has not been validated in all clinical   No results found for: CHOL, HDL, LDLCALC, LDLDIRECT, TRIG, CHOLHDL Lab Results  Component Value Date   HGBA1C  11/24/2006    5.6 (NOTE)   The ADA recommends the following therapeutic goals for glycemic   control related to Hgb A1C measurement:   Goal of Therapy:   < 7.0% Hgb A1C   Action Suggested:  > 8.0% Hgb A1C   Ref:  Diabetes Care, 22, Suppl. 1, 1999   Lab Results  Component Value Date   MGNOIBBC48 889 06/15/2012   No results found for: TSH  ASSESSMENT AND PLAN 72 y.o. year old male  has a past medical history of Allergy, Blind left eye, Blood transfusion, COPD (chronic obstructive pulmonary disease) ([place]), Depression, Diabetes mellitus, type 2 ([place]), Diverticulosis, ED (erectile dysfunction), Glaucoma, Hyperlipidemia, Hypertension, Insomnia, Internal and external bleeding hemorrhoids, Iron deficiency anemia, OSA (obstructive sleep apnea), Osteoarthritis, Rosacea, Tremor of both hands, and Tubular adenoma of colon (03/2011). here with:  1.  Parkinson's disease 2.  Sleep disturbance  Overall the  patient has remained stable.  He will continue on Sinemet 1/2 tablet 3 times a day.  Advised that he can take melatonin 1 to 3 mg approximately 1 to 2 hours before bedtime to help with sleep.  If this is not beneficial he should let us know.  He will follow-up in 6 months or sooner if needed.     Ward Givens, MSN, NP-C 11/26/2017, 9:10 AM [facility] [address], [place], [place] [address] [phone]  I reviewed the above note and documentation by the Nurse Practitioner and agree with the history, physical exam, assessment and plan as outlined above. I was immediately available for face-to-face consultation. Star Age, MD, PhD [facility])

## 2017-11-26 NOTE — Patient Instructions (Signed)
Your Plan:  Continue Sinemet 1.5 tablets three times a day Try melatonin 1-3 mg 1-2 hours before bedtime to help with sleep If your symptoms worsen or you develop new symptoms please let us know.   Thank you for coming to see [place] at [facility]. I hope we have been able to provide you high quality care today.  You may receive a patient satisfaction survey over the next few weeks. We would appreciate your feedback and comments so that we may continue to improve ourselves and the health of our patients.

## 2017-12-07 DIAGNOSIS — M79644 Pain in right finger(s): Secondary | ICD-10-CM | POA: Diagnosis not present

## 2017-12-07 DIAGNOSIS — S6991XA Unspecified injury of right wrist, hand and finger(s), initial encounter: Secondary | ICD-10-CM | POA: Diagnosis not present

## 2017-12-07 DIAGNOSIS — Z23 Encounter for immunization: Secondary | ICD-10-CM | POA: Diagnosis not present

## 2017-12-07 DIAGNOSIS — M79641 Pain in right hand: Secondary | ICD-10-CM | POA: Diagnosis not present

## 2017-12-07 DIAGNOSIS — M549 Dorsalgia, unspecified: Secondary | ICD-10-CM | POA: Diagnosis not present

## 2018-04-26 NOTE — Addendum Note (Signed)
Addended by: Parke Poisson E on: 04/26/2018 02:28 PM   Modules accepted: Orders

## 2018-05-28 ENCOUNTER — Telehealth: Payer: Self-pay | Admitting: Pulmonary Disease

## 2018-05-28 NOTE — Telephone Encounter (Signed)
Patient wife was calling about the ct and I made her aware these are getting push out to July she verbalized understanding nothing further is needed at this time.

## 2018-06-09 ENCOUNTER — Inpatient Hospital Stay: Admission: RE | Admit: 2018-06-09 | Payer: Medicare Other | Source: Ambulatory Visit

## 2018-06-09 ENCOUNTER — Other Ambulatory Visit: Payer: Self-pay | Admitting: Adult Health

## 2018-06-09 DIAGNOSIS — G20 Parkinson's disease: Secondary | ICD-10-CM

## 2018-06-14 ENCOUNTER — Other Ambulatory Visit: Payer: Self-pay

## 2018-06-14 ENCOUNTER — Ambulatory Visit: Payer: Medicare Other | Admitting: Pulmonary Disease

## 2018-06-14 ENCOUNTER — Encounter: Payer: Self-pay | Admitting: Acute Care

## 2018-06-14 ENCOUNTER — Ambulatory Visit (INDEPENDENT_AMBULATORY_CARE_PROVIDER_SITE_OTHER): Payer: Medicare Other | Admitting: Acute Care

## 2018-06-14 VITALS — BP 142/74 | HR 81 | Temp 98.7°F | Ht 72.0 in | Wt 236.4 lb

## 2018-06-14 DIAGNOSIS — J449 Chronic obstructive pulmonary disease, unspecified: Secondary | ICD-10-CM | POA: Diagnosis not present

## 2018-06-14 DIAGNOSIS — J9611 Chronic respiratory failure with hypoxia: Secondary | ICD-10-CM

## 2018-06-14 DIAGNOSIS — R918 Other nonspecific abnormal finding of lung field: Secondary | ICD-10-CM | POA: Diagnosis not present

## 2018-06-14 DIAGNOSIS — R058 Other specified cough: Secondary | ICD-10-CM

## 2018-06-14 DIAGNOSIS — R05 Cough: Secondary | ICD-10-CM

## 2018-06-14 DIAGNOSIS — G4734 Idiopathic sleep related nonobstructive alveolar hypoventilation: Secondary | ICD-10-CM

## 2018-06-14 NOTE — Patient Instructions (Addendum)
It is great to see you today.  For your COPD with emphysema.>> This appears to be stable at present - continue prn albuterol  For your Chronic respiratory failure with nocturnal hypoxemia from COPD. - continue 3 liters oxygen at night  Right lower lung mass. - I know your April scan was postponed due to the Covid Pandemic. 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 - Continue as needed OTC antihistamine  Follow up in 12 months with Dr. Halford Chessman   Please contact office for sooner follow up if symptoms do not improve or worsen or seek emergency care  Note your daily symptoms > remember "red flags" for COPD:  Increase in cough, increase in sputum production, increase in shortness of breath or activity intolerance. If you notice these symptoms, please call to be seen.  Continue to self isolate, wash hand frequently, and wear a face mask if you leave your home. Remember to utilize the early hours at grocery stores/ drug stores  for people > 41 years old or any underlying health risks, as the stores are cleaner and less crowded at these times.

## 2018-06-14 NOTE — Progress Notes (Signed)
History of Present Illness James [name] is a 73 y.o. male with COPD    James [name] is a 73 y.o. male remote  smoker ( Quit 2015 with a 75 pack year smoking history, sneaks an occasional cigarette) with COPD/emphysema, chronic hypoxic respiratory failure on supplemental oxygen at night and exertion, and pulmonary nodule.He is followed by James [name].    06/14/2018  Pt. Presents for office visit. He states he is at his baseline.He states he does occasionally smoke a cigarette.  He states he is not on maintenance inhalers. He states he is using his Mucinex , which he states works well for him . He has not had to use his neb treatments in a very long time. He states usually after being in the yard with pollen he will need one, however  he has not had to use one since 12/2017. He is compliant with his night time oxygen at 3 L. He does have OSA but he failed CPAP therapy. He states it kept him awake. He denies any wheezing or cough. He feels he is at his baseline.He states he has no worsening shortness of breath. He is using non-sedating antihistamine for his cough, which is at baseline. He states his exercise is walking the dog. He is looking forward to getting to the beach when the travel restrictions are lifted. He denies fever, chest pain, orthopnea or hemoptysis.   Test Results: Spirometry 06/08/12 >> FEV1 1.91 (49%), FEV1% 59 ONO with RA 06/16/12 >>Test time 9 hrs 21 min. Mean SpO2 89%, low SpO2 63%. Spent 2 hrs 25 min with SpO2 <88% 07/07/12 >> SpO2 86% on room air with exertion >> start 2 liters oxygen with exertion and sleep A1AT 07/07/12 >> 154, PI-MM  Chest imaging CT chest 03/23/13 >> mild/diffuse centrilobular emphysema, 4 mm nodule RLL superior segment, 4 mm nodule LLL CT chest 02/20/14 >> moderate centrilobular/paraseptal emphysema, mild diffuse bronchial wall thickening, 3 mm nodule RLL and 5 mm nodule LLL no change CT chest 02/13/15 >> new 1.5 cm nodule RLL. CT chest  05/28/15 >> increased RLL irregular nodule to 2 cm PET scan 06/14/15 >> 1.93 SUV RLL 11 mm lesion CT chest 11/13/15 >> no progression CT chest 05/28/16 >> no change in lung nodule CT chest 06/01/17 >> no change CT Chest 07/2018>>   Sleep tests: PSG 01/25/08 >>RDI 6.8, SpO2 low 84%. Spent 79% of test time with SpO2 <90%   CBC Latest Ref Rng & Units 02/08/2008 11/24/2006  WBC 4.0 - 10.5 K/uL 7.4 7.1  Hemoglobin 13.0 - 17.0 g/dL 16.8 17.5(H)  Hematocrit 39.0 - 52.0 % 48.2 50.8  Platelets 150 - 400 K/uL 187 213    BMP Latest Ref Rng & Units 03/18/2013 02/08/2008 11/24/2006  Glucose 70 - 99 mg/dL 130(H) 92 106(H)  BUN 6 - 23 mg/dL 14 8 10   Creatinine 0.4 - 1.5 mg/dL 1.1 0.92 0.92  Sodium 135 - 145 mEq/L 141 143 141  Potassium 3.5 - 5.1 mEq/L 3.9 4.1 4.8  Chloride 96 - 112 mEq/L 102 107 106  CO2 19 - 32 mEq/L 33(H) 31 29  Calcium 8.4 - 10.5 mg/dL 9.4 9.0 9.3    BNP No results found for: BNP  ProBNP No results found for: PROBNP  PFT    Component Value Date/Time   FEV1PRE 1.79 06/26/2015 1249   FEV1POST 2.19 06/26/2015 1249   FVCPRE 3.24 06/26/2015 1249   FVCPOST 3.85 06/26/2015 1249   TLC 7.51 06/26/2015 1249  DLCOUNC 16.61 06/26/2015 1249   PREFEV1FVCRT 55 06/26/2015 1249   PSTFEV1FVCRT 57 06/26/2015 1249    No results found.   Past medical hx Past Medical History:  Diagnosis Date  . Allergy    seasonal  . Blind left eye    legally  . Blood transfusion    2002  . COPD (chronic obstructive pulmonary disease) ([place])   . Depression   . Diabetes mellitus, type 2 ([place])   . Diverticulosis   . ED (erectile dysfunction)   . Glaucoma   . Hyperlipidemia   . Hypertension   . Insomnia   . Internal and external bleeding hemorrhoids   . Iron deficiency anemia   . OSA (obstructive sleep apnea)   . Osteoarthritis   . Rosacea   . Tremor of both hands   . Tubular adenoma of colon 03/2011     Social History   Tobacco Use  . Smoking status: Former Smoker     Packs/day: 1.50    Years: 50.00    Pack years: 75.00    Types: Cigarettes    Last attempt to quit: 04/29/2013    Years since quitting: 5.1  . Smokeless tobacco: Never Used  Substance Use Topics  . Alcohol use: Yes    Alcohol/week: 0.0 standard drinks    Comment: once yearly  . Drug use: No    JamesGill reports that he quit smoking about 5 years ago. His smoking use included cigarettes. He has a 75.00 pack-year smoking history. He has never used smokeless tobacco. He reports current alcohol use. He reports that he does not use drugs.  Tobacco Cessation: Former smoker quit 2015 with a 75 pack year smoking history.  I have spent 3 minutes counseling patient on smoking cessation this visit. Patient verbalizes understanding of their  choice to continue smoking and the negative health consequences including worsening of COPD, risk of lung cancer , stroke and heart disease.Marland Kitchen      Past surgical hx, Family hx, Social hx all reviewed.  Current Outpatient Medications on File Prior to Visit  Medication Sig  . albuterol (PROVENTIL) (2.5 MG/3ML) 0.083% nebulizer solution TAKE 3 MLS VIA NEBULIZER EVERY 6 HRS AS NEEDED FOR WHEEZING OR SHORTNESS OF BREATH  . atorvastatin (LIPITOR) 20 MG tablet Take 20 mg by mouth at bedtime.   Marland Kitchen buPROPion (WELLBUTRIN XL) 300 MG 24 hr tablet Take 300 mg by mouth daily.   . calcium-vitamin D (OSCAL WITH D) 250-125 MG-UNIT per tablet Take 1 tablet by mouth daily.  . carbidopa-levodopa (SINEMET IR) 25-100 MG tablet TAKE 1.5 TABLETS BY MOUTH 3 (THREE) TIMES DAILY.  . cetirizine (ZYRTEC) 10 MG tablet Take 10 mg by mouth daily.  . ferrous sulfate 325 (65 FE) MG tablet Take 325 mg by mouth 2 (two) times daily with a meal.   . Green Tea 315 MG CAPS Take 315 mg by mouth daily.  . hydrochlorothiazide (HYDRODIURIL) 25 MG tablet Take 25 mg by mouth daily.   Marland Kitchen MEGARED OMEGA-3 KRILL OIL PO Take 1 capsule by mouth daily.  . metFORMIN (GLUCOPHAGE) 500 MG tablet Take 1,000 mg by mouth  2 (two) times daily with a meal.   . Multiple Vitamins-Minerals (MULTIVITAMIN WITH MINERALS) tablet Take 1 tablet by mouth daily.    Marland Kitchen omeprazole (PRILOSEC OTC) 20 MG tablet Take 20 mg by mouth daily with breakfast.  . sertraline (ZOLOFT) 100 MG tablet Take 100 mg by mouth daily.   . vardenafil (LEVITRA) 20 MG tablet Take 20  mg by mouth daily as needed for erectile dysfunction.    No current facility-administered medications on file prior to visit.      Allergies  Allergen Reactions  . Codeine Itching  . Morphine Itching    Review Of Systems:  Constitutional:   No  weight loss, night sweats,  Fevers, chills, fatigue, or  lassitude.  HEENT:   No headaches,  Difficulty swallowing,  Tooth/dental problems, or  Sore throat,                No sneezing, itching, ear ache, nasal congestion, + post nasal drip,   CV:  No chest pain,  Orthopnea, PND, swelling in lower extremities, anasarca, dizziness, palpitations, syncope.   GI  No heartburn, indigestion, abdominal pain, nausea, vomiting, diarrhea, change in bowel habits, loss of appetite, bloody stools.   Resp: Baseline  shortness of breath with exertion none  at rest.  No excess mucus, no productive cough,  No non-productive cough,  No coughing up of blood.  No change in color of mucus.  No wheezing.  No chest wall deformity  Skin: no rash or lesions.  GU: no dysuria, change in color of urine, no urgency or frequency.  No flank pain, no hematuria   MS:  No joint pain or swelling.  No decreased range of motion.  No back pain.  Psych:  No change in mood or affect. No depression or anxiety.  No memory loss.   Vital Signs BP (!) 142/74 (BP Location: Left Arm, Cuff Size: Normal)   Pulse 81   Temp 98.7 F (37.1 C)   Ht 6' (1.829 m)   Wt 236 lb 6.4 oz (107.2 kg)   SpO2 94%   BMI 32.06 kg/m    Physical Exam:  General- No distress,  A&Ox3, pleasant ENT: No sinus tenderness, TM clear, pale nasal mucosa, no oral exudate,+ post nasal  drip, no LAN Cardiac: S1, S2, regular rate and rhythm, no murmur Chest: No wheeze/ rales/ dullness; no accessory muscle use, no nasal flaring, no sternal retractions, diminished per bases Abd.: Soft Non-tender, ND, BS +, obese Ext: No clubbing cyanosis, edema, no obvious deformities Neuro:  normal strength, MAE x 4, A&O x 3 Skin: No rashes, warm and dry Psych: normal mood and behavior   Assessment/Plan  No problem-specific Assessment & Plan notes found for this encounter.  COPD with emphysema. - continue prn albuterol  Chronic respiratory failure with nocturnal hypoxemia from COPD. - continue 3 liters oxygen at night  Right lower lung mass. 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 - prn OTC antihistamine  Note your daily symptoms > remember "red flags" for COPD:  Increase in cough, increase in sputum production, increase in shortness of breath or activity intolerance. If you notice these symptoms, please call to be seen.    Continue to self isolate, wash hand frequently, and wear a face mask if you leave your home. Remember to utilize the early hours at grocery stores/ drug stores  for people > 35 years old or any underlying health risks, as the stores are cleaner and less crowded at these times.     Magdalen Spatz, NP 06/14/2018  2:56 PM

## 2018-08-10 ENCOUNTER — Telehealth: Payer: Self-pay | Admitting: *Deleted

## 2018-08-10 NOTE — Telephone Encounter (Signed)

## 2018-08-11 ENCOUNTER — Other Ambulatory Visit: Payer: Self-pay

## 2018-08-11 ENCOUNTER — Ambulatory Visit (INDEPENDENT_AMBULATORY_CARE_PROVIDER_SITE_OTHER)
Admission: RE | Admit: 2018-08-11 | Discharge: 2018-08-11 | Disposition: A | Discharge status: Home / Self Care | Payer: Medicare Other | Source: Ambulatory Visit | Attending: Acute Care | Admitting: Acute Care

## 2018-08-11 DIAGNOSIS — R911 Solitary pulmonary nodule: Secondary | ICD-10-CM | POA: Diagnosis not present

## 2018-08-11 DIAGNOSIS — R918 Other nonspecific abnormal finding of lung field: Secondary | ICD-10-CM | POA: Diagnosis not present

## 2018-08-11 DIAGNOSIS — J439 Emphysema, unspecified: Secondary | ICD-10-CM | POA: Diagnosis not present

## 2018-08-11 IMAGING — CT CT CHEST WITHOUT CONTRAST
2 of 3 series · 15 of 36 positions shown, 18 images · non-contrast
Comparison: [DATE]

CLINICAL DATA: Followup pulmonary nodule.

EXAM:
CT CHEST WITHOUT CONTRAST
TECHNIQUE: Multidetector CT imaging of the chest was performed following the
standard protocol without IV contrast.

[Series 2: thorax · axial · 0.84mm/px · z∈[-385,-77]mm · 12 of 182 slices shown, 15 images]
[im 14/182  mediastinal]
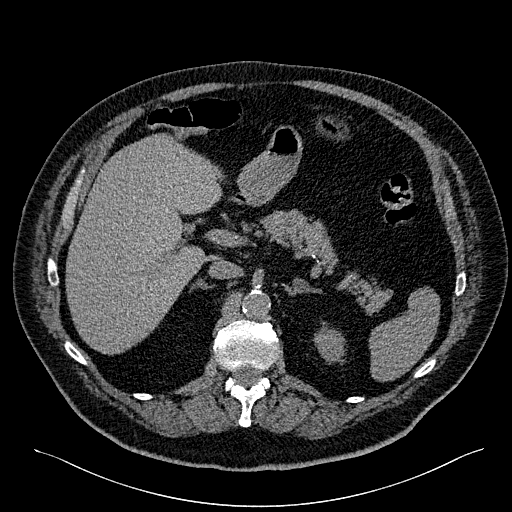
[im 14/182  lung]
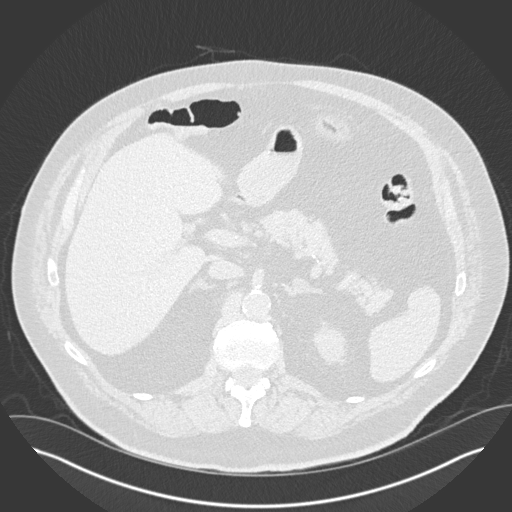
[im 27/182  lung]
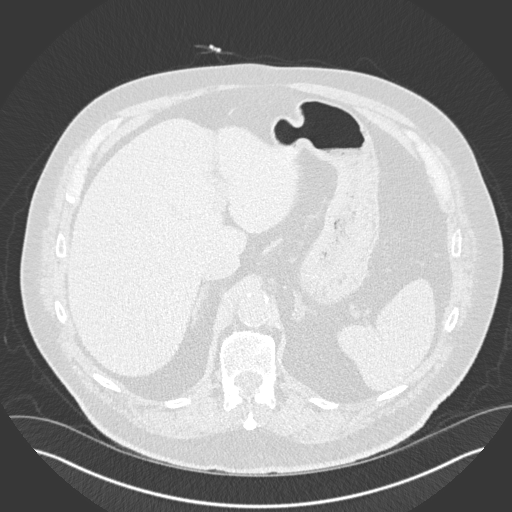
[im 41/182  lung]
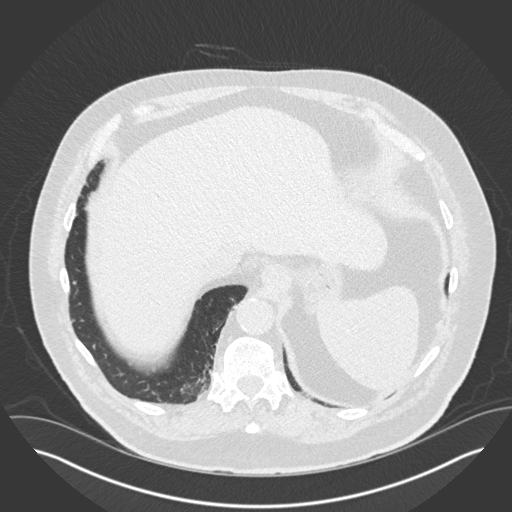
[im 54/182  lung]
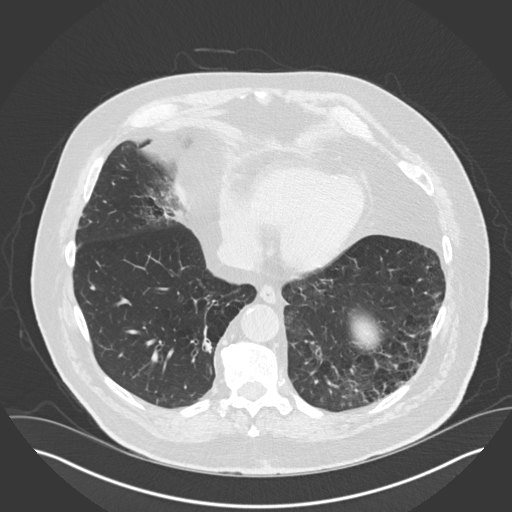
[im 68/182  mediastinal]
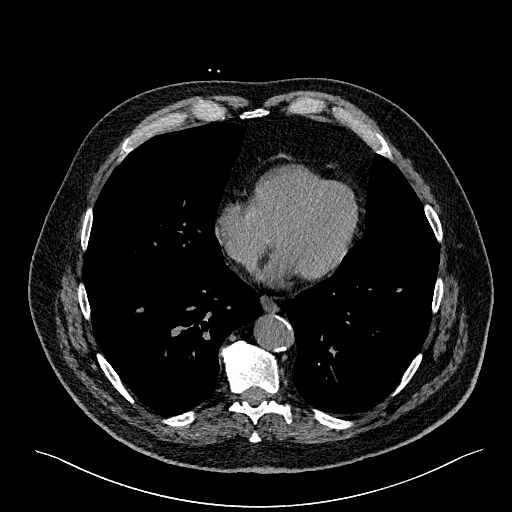
[im 68/182  lung]
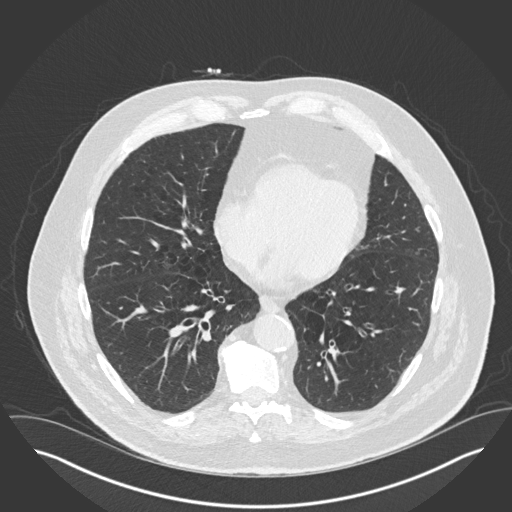
[im 81/182  lung]
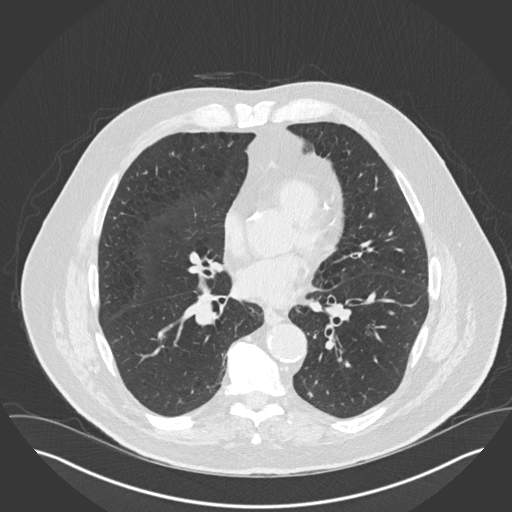
[im 101/182  lung]
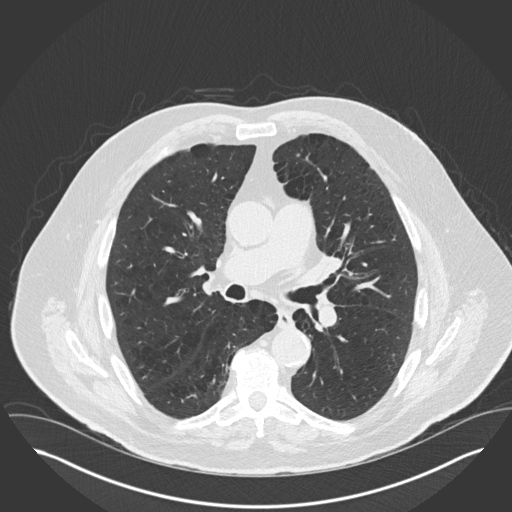
[im 114/182  lung]
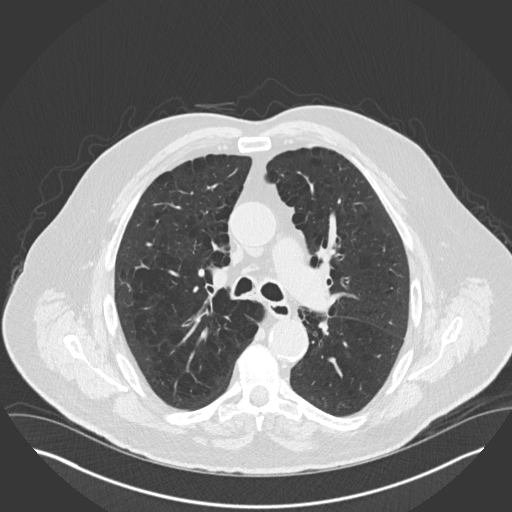
[im 128/182  mediastinal]
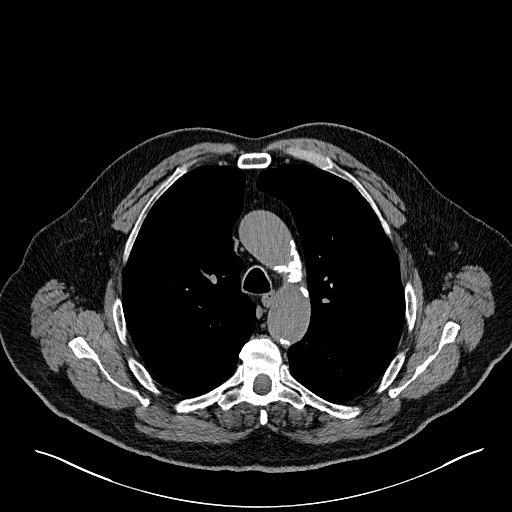
[im 128/182  lung]
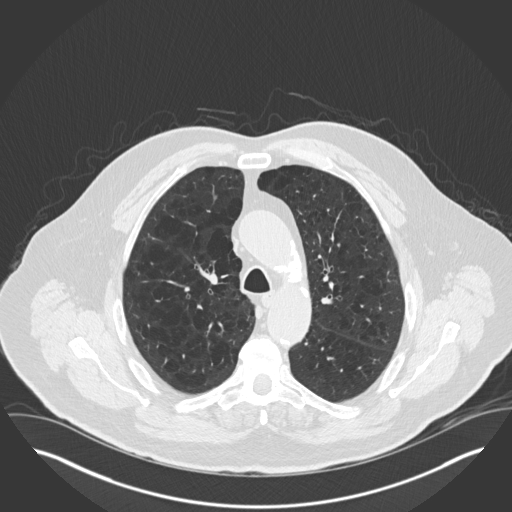
[im 141/182  lung]
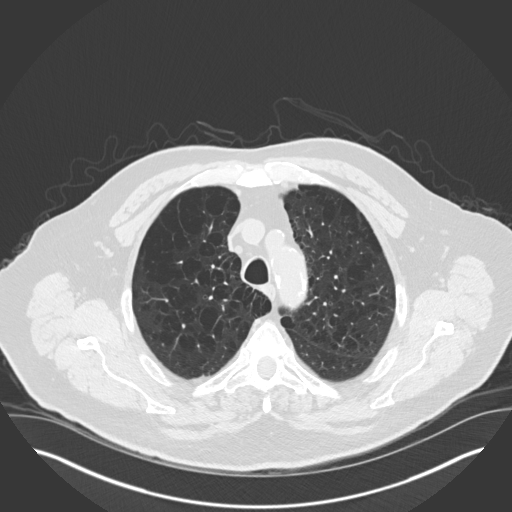
[im 155/182  lung]
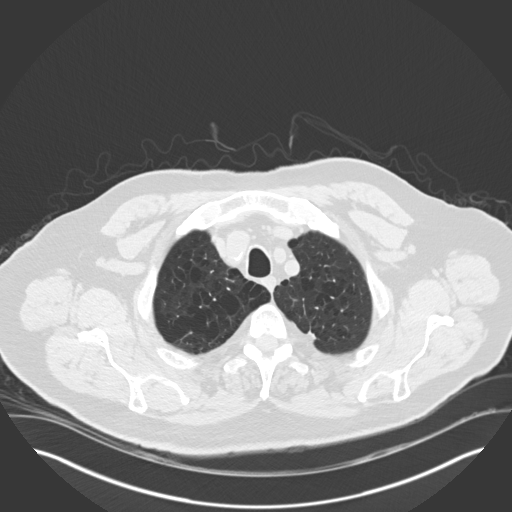
[im 168/182  lung]
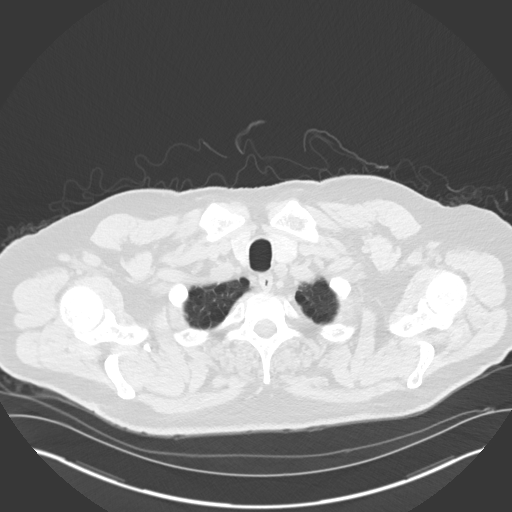

[Series 5: coronal · coronal · 0.71mm/px · 3 of 156 slices shown]
[im 32/156  lung]
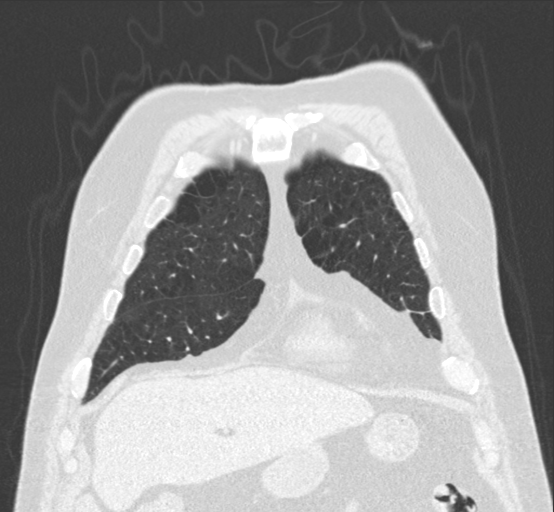
[im 63/156  lung]
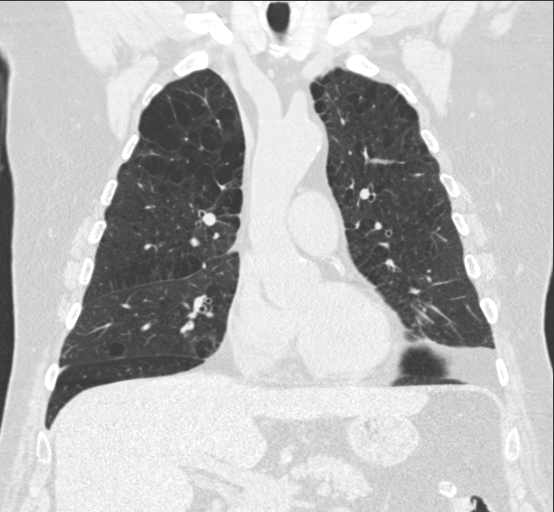
[im 94/156  lung]
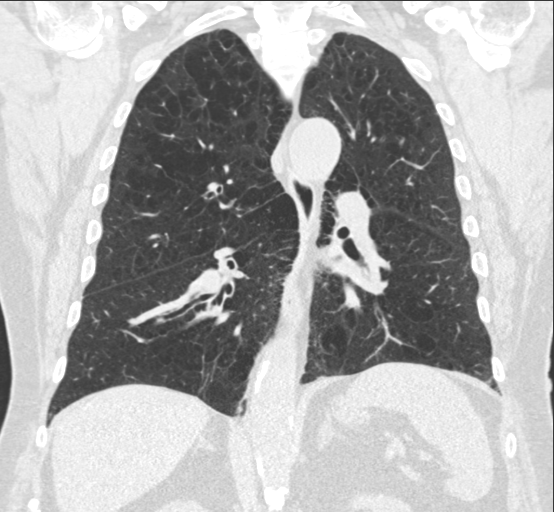

[15 of 36 positions shown; findings below may reference images not displayed]

FINDINGS: Cardiovascular: Heart size normal. Aortic atherosclerosis.
Calcification in the left main, lad and RCA coronary arteries noted.
No pericardial effusion.

Mediastinum/Nodes: Normal appearance of the thyroid gland. The
trachea appears patent and is midline. Normal appearance of the
esophagus. No axillary, supraclavicular, mediastinal or hilar
adenopathy.

Lungs/Pleura: Advanced changes of emphysema. Pulmonary nodule within
the posterior right lower lobe measures 0.7 x 1.3 cm, image 123/3.
Stable 3 mm nodule in superior segment of left lower lobe. No new
nodules.

Upper Abdomen: Unchanged low-density adrenal nodules are noted
bilaterally. Likely benign adenomas.

Musculoskeletal: Thoracic spondylosis. No acute or suspicious bone
abnormalities.
IMPRESSION: 1. Stable exam. No change in the appearance of posterior right lower
lobe lung nodules measuring up to 1.3 cm. Today represents greater
than 24 months of stability compatible with a benign abnormality.
2. Aortic Atherosclerosis ([N6]-[N6]) and Emphysema ([N6]-[N6]).
3. Coronary artery atherosclerotic calcifications.  The

## 2018-08-19 DIAGNOSIS — Z125 Encounter for screening for malignant neoplasm of prostate: Secondary | ICD-10-CM | POA: Diagnosis not present

## 2018-08-19 DIAGNOSIS — I10 Essential (primary) hypertension: Secondary | ICD-10-CM | POA: Diagnosis not present

## 2018-08-19 DIAGNOSIS — D500 Iron deficiency anemia secondary to blood loss (chronic): Secondary | ICD-10-CM | POA: Diagnosis not present

## 2018-08-19 DIAGNOSIS — E7800 Pure hypercholesterolemia, unspecified: Secondary | ICD-10-CM | POA: Diagnosis not present

## 2018-08-19 DIAGNOSIS — E119 Type 2 diabetes mellitus without complications: Secondary | ICD-10-CM | POA: Diagnosis not present

## 2018-09-09 ENCOUNTER — Telehealth: Payer: Self-pay | Admitting: Hematology and Oncology

## 2018-09-09 NOTE — Telephone Encounter (Signed)
Received a new referral from Dr. [name] for unexplained anemia. Pt has been scheduled to see Dr. Lindi Adie on 7/28 at 1pm. He's been made aware to arrive 20 minutes early. Address and location to our facility.

## 2018-09-15 DIAGNOSIS — Z1212 Encounter for screening for malignant neoplasm of rectum: Secondary | ICD-10-CM | POA: Diagnosis not present

## 2018-09-20 NOTE — Progress Notes (Signed)
[facility] NOTE  Patient Care Team: [name], MD as PCP - General (Internal Medicine)  CHIEF COMPLAINTS/PURPOSE OF CONSULTATION:  Newly diagnosed anemia  HISTORY OF PRESENTING ILLNESS:  James [name] 73 y.o. male is here because of recent diagnosis of anemia. He was referred by his PCP Dr. [name]. He had multiple guaiac cards positive for blood in 05/2015. Colonoscopy on 07/25/15 showed no bleeding. Endoscopy on 07/25/15 showed a small hernia and no bleeding. 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 Labs on 08/14/18 showed: Hg 14.8, HCT 45.1, MCV 92.4, creatinine 1.2. Labs on 08/19/18 showed: Hg 11.7, HCT 40.0, MCV 94.3, creatinine 1.09. He presents to the clinic today for initial consultation.  Dr. Fuller Plan is his gastroenterologist.  He tells me that he had a colonoscopy 2 years ago.  He has not noticed any visible blood in the stool.  I reviewed his records extensively and collaborated the history with the patient.   MEDICAL HISTORY:  Past Medical History:  Diagnosis Date  . Allergy    seasonal  . Blind left eye    legally  . Blood transfusion    2002  . COPD (chronic obstructive pulmonary disease) ([place])   . Depression   . Diabetes mellitus, type 2 ([place])   . Diverticulosis   . ED (erectile dysfunction)   . Glaucoma   . Hyperlipidemia   . Hypertension   . Insomnia   . Internal and external bleeding hemorrhoids   . Iron deficiency anemia   . OSA (obstructive sleep apnea)   . Osteoarthritis   . Rosacea   . Tremor of both hands   . Tubular adenoma of colon 03/2011    SURGICAL HISTORY: Past Surgical History:  Procedure Laterality Date  . ARTHROSCOPIC REPAIR ACL    . CHOLECYSTECTOMY    . [place]   right  . COLONOSCOPY WITH PROPOFOL N/A 07/25/2015   Procedure: COLONOSCOPY WITH PROPOFOL;  Surgeon: [name], MD;  Location: [facility];  Service: Endoscopy;  Laterality: N/A;  .  ESOPHAGOGASTRODUODENOSCOPY (EGD) WITH PROPOFOL N/A 07/25/2015   Procedure: ESOPHAGOGASTRODUODENOSCOPY (EGD) WITH PROPOFOL;  Surgeon: [name], MD;  Location: [facility];  Service: Endoscopy;  Laterality: N/A;  . KNEE ARTHROSCOPY  1996  . PARTIAL COLECTOMY  2008  . THYROID CYST EXCISION    . TONSILLECTOMY      SOCIAL HISTORY: Social History   Socioeconomic History  . Marital status: Married    Spouse name: Not on file  . Number of children: 1  . Years of education: HS  . Highest education level: Not on file  Occupational History  . Occupation: retired    Fish farm manager: RETIRED  Social Needs  . Financial resource strain: Not on file  . Food insecurity    Worry: Not on file    Inability: Not on file  . Transportation needs    Medical: Not on file    Non-medical: Not on file  Tobacco Use  . Smoking status: Current Some Day Smoker    Packs/day: 1.50    Years: 50.00    Pack years: 75.00    Types: Cigarettes    Last attempt to quit: 04/29/2013    Years since quitting: 5.4  . Smokeless tobacco: Never Used  . Tobacco comment: Stateshe sometimes sneaks a cigarette  Substance and Sexual Activity  . Alcohol use: Yes    Alcohol/week: 0.0 standard drinks    Comment:  once yearly  . Drug use: No  . Sexual activity: Not on file  Lifestyle  . Physical activity    Days per week: Not on file    Minutes per session: Not on file  . Stress: Not on file  Relationships  . Social Herbalist on phone: Not on file    Gets together: Not on file    Attends religious service: Not on file    Active member of club or organization: Not on file    Attends meetings of clubs or organizations: Not on file    Relationship status: Not on file  . Intimate partner violence    Fear of current or ex partner: Not on file    Emotionally abused: Not on file    Physically abused: Not on file    Forced sexual activity: Not on file  Other Topics Concern  . Not on file  Social History  Narrative   Drinks 1-2 [employer] a day     FAMILY HISTORY: Family History  Problem Relation Age of Onset  . CAD Father   . Alcohol abuse Father   . Heart disease Father   . COPD Mother   . Asthma Mother   . Emphysema Mother   . Cancer Maternal Grandmother   . Colon cancer Neg Hx     ALLERGIES:  is allergic to codeine and morphine.  MEDICATIONS:  Current Outpatient Medications  Medication Sig Dispense Refill  . albuterol (PROVENTIL) (2.5 MG/3ML) 0.083% nebulizer solution TAKE 3 MLS VIA NEBULIZER EVERY 6 HRS AS NEEDED FOR WHEEZING OR SHORTNESS OF BREATH 360 mL 0  . atorvastatin (LIPITOR) 20 MG tablet Take 20 mg by mouth at bedtime.     Marland Kitchen buPROPion (WELLBUTRIN XL) 300 MG 24 hr tablet Take 300 mg by mouth daily.     . calcium-vitamin D (OSCAL WITH D) 250-125 MG-UNIT per tablet Take 1 tablet by mouth daily.    . carbidopa-levodopa (SINEMET IR) 25-100 MG tablet TAKE 1.5 TABLETS BY MOUTH 3 (THREE) TIMES DAILY. 405 tablet 1  . cetirizine (ZYRTEC) 10 MG tablet Take 10 mg by mouth daily.    . ferrous gluconate (FERGON) 324 MG tablet Take 1 tablet (324 mg total) by mouth daily with breakfast. 90 tablet 3  . ferrous sulfate 325 (65 FE) MG tablet Take 325 mg by mouth 2 (two) times daily with a meal.     . Green Tea 315 MG CAPS Take 315 mg by mouth daily.    . hydrochlorothiazide (HYDRODIURIL) 25 MG tablet Take 25 mg by mouth daily.     Marland Kitchen MEGARED OMEGA-3 KRILL OIL PO Take 1 capsule by mouth daily.    . metFORMIN (GLUCOPHAGE) 500 MG tablet Take 1,000 mg by mouth 2 (two) times daily with a meal.     . Multiple Vitamins-Minerals (MULTIVITAMIN WITH MINERALS) tablet Take 1 tablet by mouth daily.      Marland Kitchen omeprazole (PRILOSEC OTC) 20 MG tablet Take 20 mg by mouth daily with breakfast.    . sertraline (ZOLOFT) 100 MG tablet Take 100 mg by mouth daily.     . vardenafil (LEVITRA) 20 MG tablet Take 20 mg by mouth daily as needed for erectile dysfunction.      No current facility-administered medications for  this visit.     REVIEW OF SYSTEMS:   Constitutional: Denies fevers, chills or abnormal night sweats Eyes: Denies blurriness of vision, double vision or watery eyes Ears, nose, mouth, throat, and face:  Denies mucositis or sore throat Respiratory: Denies cough, dyspnea or wheezes Cardiovascular: Denies palpitation, chest discomfort or lower extremity swelling Gastrointestinal:  Denies nausea, heartburn or change in bowel habits Skin: Denies abnormal skin rashes Lymphatics: Denies new lymphadenopathy or easy bruising Neurological:Denies numbness, tingling or new weaknesses Behavioral/Psych: Mood is stable, no new changes  All other systems were reviewed with the patient and are negative.  PHYSICAL EXAMINATION: ECOG PERFORMANCE STATUS: 1 - Symptomatic but completely ambulatory  Vitals:   09/21/18 1315  BP: (!) 144/61  Pulse: 92  Resp: 18  Temp: 98.5 F (36.9 C)  SpO2: 94%   There were no vitals filed for this visit.  GENERAL:alert, no distress and comfortable SKIN: skin color, texture, turgor are normal, no rashes or significant lesions EYES: normal, conjunctiva are pink and non-injected, sclera clear OROPHARYNX:no exudate, no erythema and lips, buccal mucosa, and tongue normal  NECK: supple, thyroid normal size, non-tender, without nodularity LYMPH:  no palpable lymphadenopathy in the cervical, axillary or inguinal LUNGS: clear to auscultation and percussion with normal breathing effort HEART: regular rate & rhythm and no murmurs and no lower extremity edema ABDOMEN:abdomen soft, non-tender and normal bowel sounds Musculoskeletal:no cyanosis of digits and no clubbing  PSYCH: alert & oriented x 3 with fluent speech NEURO: no focal motor/sensory deficits  LABORATORY DATA:  I have reviewed the data as listed Lab Results  Component Value Date   WBC 7.4 02/08/2008   HGB 16.8 02/08/2008   HCT 48.2 02/08/2008   MCV 92.3 02/08/2008   PLT 187 02/08/2008   Lab Results   Component Value Date   NA 141 03/18/2013   K 3.9 03/18/2013   CL 102 03/18/2013   CO2 33 (H) 03/18/2013    RADIOGRAPHIC STUDIES: I have personally reviewed the radiological reports and agreed with the findings in the report.  ASSESSMENT AND PLAN:  Iron deficiency anemia Lab review: 08/19/2018: Hemoglobin 11.7, MCV 74.3, RDW 18.2, rest of the CBC and platelet count normal 08/13/2017: Hemoglobin 14.8, MCV 92.4, WBC 9, platelets 245 Microcytic anemia: Differential diagnosis iron deficiency versus other causes of microcytosis We will need to check iron studies and ferritin today.  Treatment plan: Oral iron ferrous gluconate 324 mg once a day.  His hemoglobin can be rechecked in 3 months and follow-up. If he does not respond to oral iron therapy then we can consider intravenous iron. Discussed the process of iron absorption. He will need to see Dr. Fuller Plan to rule out upper and lower intestinal bleedings.  All questions were answered. The patient knows to call the clinic with any problems, questions or concerns.   Rulon Eisenmenger, MD 09/21/2018    I, Molly Dorshimer, am acting as scribe for Nicholas Lose, MD.  I have reviewed the above documentation for accuracy and completeness, and I agree with the above.

## 2018-09-21 ENCOUNTER — Inpatient Hospital Stay: Payer: Medicare Other

## 2018-09-21 ENCOUNTER — Other Ambulatory Visit: Payer: Self-pay

## 2018-09-21 ENCOUNTER — Inpatient Hospital Stay: Payer: Medicare Other | Attending: Hematology and Oncology | Admitting: Hematology and Oncology

## 2018-09-21 DIAGNOSIS — D509 Iron deficiency anemia, unspecified: Secondary | ICD-10-CM

## 2018-09-21 DIAGNOSIS — Z720 Tobacco use: Secondary | ICD-10-CM

## 2018-09-21 DIAGNOSIS — Z809 Family history of malignant neoplasm, unspecified: Secondary | ICD-10-CM | POA: Diagnosis not present

## 2018-09-21 LAB — CBC WITH DIFFERENTIAL (CANCER CENTER ONLY)
Abs Immature Granulocytes: 0.03 10*3/uL (ref 0.00–0.07)
Basophils Absolute: 0.1 10*3/uL (ref 0.0–0.1)
Basophils Relative: 1 %
Eosinophils Absolute: 0.1 10*3/uL (ref 0.0–0.5)
Eosinophils Relative: 2 %
HCT: 39.5 % (ref 39.0–52.0)
Hemoglobin: 11.2 g/dL — ABNORMAL LOW (ref 13.0–17.0)
Immature Granulocytes: 0 %
Lymphocytes Relative: 20 %
Lymphs Abs: 1.6 10*3/uL (ref 0.7–4.0)
MCH: 20.6 pg — ABNORMAL LOW (ref 26.0–34.0)
MCHC: 28.4 g/dL — ABNORMAL LOW (ref 30.0–36.0)
MCV: 72.7 fL — ABNORMAL LOW (ref 80.0–100.0)
Monocytes Absolute: 0.9 10*3/uL (ref 0.1–1.0)
Monocytes Relative: 11 %
Neutro Abs: 5.4 10*3/uL (ref 1.7–7.7)
Neutrophils Relative %: 66 %
Platelet Count: 307 10*3/uL (ref 150–400)
RBC: 5.43 MIL/uL (ref 4.22–5.81)
RDW: 17.2 % — ABNORMAL HIGH (ref 11.5–15.5)
WBC Count: 8.1 10*3/uL (ref 4.0–10.5)
nRBC: 0 % (ref 0.0–0.2)

## 2018-09-21 LAB — IRON AND TIBC
Iron: 21 ug/dL — ABNORMAL LOW (ref 42–163)
Saturation Ratios: 5 % — ABNORMAL LOW (ref 20–55)
TIBC: 435 ug/dL — ABNORMAL HIGH (ref 202–409)
UIBC: 414 ug/dL — ABNORMAL HIGH (ref 117–376)

## 2018-09-21 LAB — RETICULOCYTES
Immature Retic Fract: 30.1 % — ABNORMAL HIGH (ref 2.3–15.9)
RBC.: 5.41 MIL/uL (ref 4.22–5.81)
Retic Count, Absolute: 80.1 10*3/uL (ref 19.0–186.0)
Retic Ct Pct: 1.5 % (ref 0.4–3.1)

## 2018-09-21 LAB — FERRITIN: Ferritin: 4 ng/mL — ABNORMAL LOW (ref 24–336)

## 2018-09-21 MED ORDER — FERROUS GLUCONATE 324 (38 FE) MG PO TABS: 324.0000 mg | ORAL_TABLET | Freq: Every day | ORAL | 3 refills | Status: DC

## 2018-09-21 NOTE — Assessment & Plan Note (Signed)
Lab review: 08/19/2018: Hemoglobin 11.7, MCV 74.3, RDW 18.2, rest of the CBC and platelet count normal 08/13/2017: Hemoglobin 14.8, MCV 92.4, WBC 9, platelets 245 Microcytic anemia: Differential diagnosis iron deficiency versus other causes of microcytosis We will need to check iron studies and ferritin today. If he is low in iron we can consider giving him oral iron therapy.  His hemoglobin can be rechecked in 3 months and follow-up. If he does not respond to oral iron therapy then we can consider intravenous iron. Discussed the process of iron absorption. He will need a gastroenterology evaluation to rule out upper and lower intestinal bleedings.

## 2018-09-22 ENCOUNTER — Telehealth: Payer: Self-pay | Admitting: Hematology and Oncology

## 2018-09-22 NOTE — Telephone Encounter (Signed)
I could not reach patient regarding schedule  °

## 2018-11-18 DIAGNOSIS — Z23 Encounter for immunization: Secondary | ICD-10-CM | POA: Diagnosis not present

## 2018-11-29 ENCOUNTER — Ambulatory Visit: Payer: Medicare Other | Admitting: Adult Health

## 2018-11-29 ENCOUNTER — Ambulatory Visit (INDEPENDENT_AMBULATORY_CARE_PROVIDER_SITE_OTHER): Payer: Medicare Other | Admitting: Family Medicine

## 2018-11-29 ENCOUNTER — Other Ambulatory Visit: Payer: Self-pay

## 2018-11-29 ENCOUNTER — Encounter: Payer: Self-pay | Admitting: Family Medicine

## 2018-11-29 VITALS — BP 116/68 | HR 85 | Temp 97.8°F | Ht 73.0 in | Wt 236.4 lb

## 2018-11-29 DIAGNOSIS — R413 Other amnesia: Secondary | ICD-10-CM

## 2018-11-29 DIAGNOSIS — F329 Major depressive disorder, single episode, unspecified: Secondary | ICD-10-CM

## 2018-11-29 DIAGNOSIS — F32A Depression, unspecified: Secondary | ICD-10-CM

## 2018-11-29 DIAGNOSIS — R296 Repeated falls: Secondary | ICD-10-CM | POA: Diagnosis not present

## 2018-11-29 DIAGNOSIS — G20 Parkinson's disease: Secondary | ICD-10-CM | POA: Diagnosis not present

## 2018-11-29 MED ORDER — DONEPEZIL HCL 5 MG PO TABS: 5.0000 mg | ORAL_TABLET | Freq: Every day | ORAL | 3 refills | Status: DC

## 2018-11-29 MED ORDER — CARBIDOPA-LEVODOPA 25-100 MG PO TABS: 2.0000 | ORAL_TABLET | Freq: Three times a day (TID) | ORAL | 3 refills | Status: DC

## 2018-11-29 NOTE — Patient Instructions (Addendum)
Continue Sinemet 2 tablet three times daily (10a, 1-2p, 4-6p)  Add Aricept (donapizil) 5mg  at bedtime in about 2 weeks after increasing Sinemet, we may increase to 10mg  in a few weeks if well tolerated ( call me with report)  Fall precautions, consider PT referral   Follow up in 1 year, sooner if needed  Parkinson's Disease Parkinson's disease causes problems with movements. It is a long-term condition. It gets worse over time (is progressive). It affects each person in different ways. It makes it harder for you to:  Control how your body moves.  Move your body normally. The condition can range from mild to very bad (advanced). What are the causes? This condition results from a loss of brain cells called neurons. These brain cells make a chemical called dopamine, which is needed to control body movement. As the condition gets worse, the brain cells make less dopamine. This makes it hard to move or control your movements. The exact cause of this condition is not known. What increases the risk?  Being male.  Being age 60 or older.  Having family members who had Parkinson's disease.  Having had an injury to the brain.  Being very sad (depressed).  Being around things that are harmful or poisonous. What are the signs or symptoms? Symptoms of this condition can vary. The main symptoms have to do with movement. These include:  A tremor or shaking while you are resting that you cannot control.  Stiffness in your neck, arms, and legs.  Slowing of movement. This may include: ? Losing expressions of the face. ? Having trouble making small movements that are needed to button your clothing or brush your teeth.  Walking in a way that is not normal. You may walk with short, shuffling steps.  Loss of balance when standing. You may sway, fall backward, or have trouble making turns. Other symptoms include:  Being very sad, worried, or confused.  Seeing or hearing things that are not  real.  Losing thinking abilities (dementia).  Trouble speaking or swallowing.  Having a hard time pooping (constipation).  Needing to pee right away, peeing often, or not being able to control when you pee or poop.  Sleep problems. How is this treated? There is no cure. The goal of treatment is to manage your symptoms. Treatment may include:  Medicines.  Therapy to help with talking or movement.  Surgery to reduce shaking and other movements that you cannot control. Follow these instructions at home: Medicines  Take over-the-counter and prescription medicines only as told by your doctor.  Avoid taking pain or sleeping medicines. Eating and drinking  Follow instructions from your doctor about what you cannot eat or drink.  Do not drink alcohol. Activity  Talk with your doctor about if it is safe for you to drive.  Do exercises as told by your doctor. Lifestyle      Put in grab bars and railings in your home. These help to prevent falls.  Do not use any products that contain nicotine or tobacco, such as cigarettes, e-cigarettes, and chewing tobacco. If you need help quitting, ask your doctor.  Join a support group. General instructions  Talk with your doctor about what you need help with and what your safety needs are.  Keep all follow-up visits as told by your doctor, including any therapy visits to help with talking or moving. This is important. Contact a doctor if:  Medicines do not help your symptoms.  You feel off-balance.  You fall at home.  You need more help at home.  You have trouble swallowing.  You have a very hard time pooping.  You have a lot of side effects from your medicines.  You feel very sad, worried, or confused. Get help right away if:  You were hurt in a fall.  You see or hear things that are not real.  You cannot swallow without choking.  You have chest pain or trouble breathing.  You do not feel safe at home.  You  have thoughts about hurting yourself or others. If you ever feel like you may hurt yourself or others, or have thoughts about taking your own life, get help right away. You can go to your nearest emergency department or call:  Your local emergency services (911 in the [place]  A suicide crisis helpline, such as the [place] at (352)337-1757. This is open 24 hours a day. Summary  This condition causes problems with movements.  It is a long-term condition. It gets worse over time.  There is no cure. Treatment focuses on managing your symptoms.  Talk with your doctor about what you need help with and what your safety needs are.  Keep all follow-up visits as told by your doctor. This is important. This information is not intended to replace advice given to you by your health care provider. Make sure you discuss any questions you have with your health care provider. Document Released: 05/05/2011 Document Revised: 04/29/2018 Document Reviewed: 04/29/2018 Elsevier Patient Education  2020 [employer].

## 2018-11-29 NOTE — Progress Notes (Addendum)
PATIENT: James [name] DOB: 05/19/1945  REASON FOR VISIT: follow up HISTORY FROM: patient  Chief Complaint  Patient presents with  . Follow-up    Room 8, with wife [name]. Parkinsons disease.Sleep disturbance. "Falls, shakes alot. off balance. Depression"      HISTORY OF PRESENT ILLNESS: Today 11/29/18 James [name] is a 73 y.o. male here today for follow up for Parkinson's Disease. He continues Sinemet 1.5 tablets TID. He does note more shaking. He does feel that he shakes a little less afterwards. Usual dosing is 10a, 4p and 10p.  He has had more falls over the past year. Maybe 8-10 falls. Once he fell about 66f on his back. He denies injuries. His wife reports that his steps are getting shorter. He participated in PT (around 04/2015) but doesn't feel that it helped.  He is more depressed. He is working with PCP and taking Zoloft '150mg'$  and Wellbutrin '300mg'$   daily. He does feel that memory is declining. He watched a movie with his wife recently but did not remember it the next day. He is doing well otherwise. He enjoys [place]. [place]   HISTORY: (copied from [place] on 11/26/2017)  Mr. SGandolfois a 73year old male with a history of Parkinson's disease.  He returns today for follow-up.  He is currently on Sinemet 1/2 tablet 3 times a day.  He feels overall that his tremor may have gotten slightly worse.  He notices the tremor primarily in the upper extremities and in the left leg. No change in gait he does report that his balance is off.  He states that over the summer he was getting out of the pool and he did fall.  Fortunately did not suffer any injuries.  He does not use a cane or walker.  Denies any trouble swallowing.  Denies eating choked on food or liquids.  He reports that his sleep is fragmented.  He states that he tends to wake up every hour during the night.  For that reason he tends to nap a lot during  the day.  He returns today for evaluation.  HISTORY 05/25/17 James [name] a 73year old male with a history of Parkinson's disease. He returns today for follow-up. He reports that he is doing fairly well. Continues on Sinemet 1-1/2 tablets 3 times a day. He reports that he has a tremor in the right hand that may have gotten slightly worse. In regards to his gait or balance he reports that he has had more episodes of tripping but no falls. He states that he is trying to continue doing the exercises he learned in physical therapy. Denies any changes with the bowels or bladder. Denies any changes withswallowing or chewing food. Reports trouble sleeping. Tends to go to bed around 4 AM and wake up at 8 AM and naps frequently throughout the day. He states that he tries to stay well-hydrated with water. Returns today for evaluation.   REVIEW OF SYSTEMS: Out of a complete 14 system review of symptoms, the patient complains only of the following symptoms, gait changes, tremor, memory loss, depression and all other reviewed systems are negative.  ALLERGIES: Allergies  Allergen Reactions  . Codeine Itching  . Morphine Itching    HOME MEDICATIONS: Outpatient Medications Prior to Visit  Medication Sig Dispense Refill  . atorvastatin (LIPITOR) 20 MG tablet Take 20 mg by mouth at bedtime.     .Marland Kitchen  buPROPion (WELLBUTRIN XL) 300 MG 24 hr tablet Take 300 mg by mouth daily.     . calcium-vitamin D (OSCAL WITH D) 250-125 MG-UNIT per tablet Take 1 tablet by mouth daily.    . cetirizine (ZYRTEC) 10 MG tablet Take 10 mg by mouth daily.    . ferrous gluconate (FERGON) 324 MG tablet Take 1 tablet (324 mg total) by mouth daily with breakfast. 90 tablet 3  . ferrous sulfate 325 (65 FE) MG tablet Take 325 mg by mouth 2 (two) times daily with a meal.     . Green Tea 315 MG CAPS Take 315 mg by mouth daily.    . hydrochlorothiazide (HYDRODIURIL) 25 MG tablet Take 25 mg by mouth daily.     Marland Kitchen MEGARED OMEGA-3  KRILL OIL PO Take 1 capsule by mouth daily.    . metFORMIN (GLUCOPHAGE) 500 MG tablet Take 1,000 mg by mouth 2 (two) times daily with a meal.     . Multiple Vitamins-Minerals (MULTIVITAMIN WITH MINERALS) tablet Take 1 tablet by mouth daily.      Marland Kitchen omeprazole (PRILOSEC OTC) 20 MG tablet Take 20 mg by mouth daily with breakfast.    . sertraline (ZOLOFT) 100 MG tablet Take 150 mg by mouth daily.     . vardenafil (LEVITRA) 20 MG tablet Take 20 mg by mouth daily as needed for erectile dysfunction.     . carbidopa-levodopa (SINEMET IR) 25-100 MG tablet TAKE 1.5 TABLETS BY MOUTH 3 (THREE) TIMES DAILY. 405 tablet 1  . albuterol (PROVENTIL) (2.5 MG/3ML) 0.083% nebulizer solution TAKE 3 MLS VIA NEBULIZER EVERY 6 HRS AS NEEDED FOR WHEEZING OR SHORTNESS OF BREATH 360 mL 0   No facility-administered medications prior to visit.     PAST MEDICAL HISTORY: Past Medical History:  Diagnosis Date  . Allergy    seasonal  . Blind left eye    legally  . Blood transfusion    2002  . COPD (chronic obstructive pulmonary disease) ([place])   . Depression   . Diabetes mellitus, type 2 ([place])   . Diverticulosis   . ED (erectile dysfunction)   . Glaucoma   . Hyperlipidemia   . Hypertension   . Insomnia   . Internal and external bleeding hemorrhoids   . Iron deficiency anemia   . OSA (obstructive sleep apnea)   . Osteoarthritis   . Rosacea   . Tremor of both hands   . Tubular adenoma of colon 03/2011    PAST SURGICAL HISTORY: Past Surgical History:  Procedure Laterality Date  . ARTHROSCOPIC REPAIR ACL    . CHOLECYSTECTOMY    . [place]   right  . COLONOSCOPY WITH PROPOFOL N/A 07/25/2015   Procedure: COLONOSCOPY WITH PROPOFOL;  Surgeon: [name], MD;  Location: [facility];  Service: Endoscopy;  Laterality: N/A;  . ESOPHAGOGASTRODUODENOSCOPY (EGD) WITH PROPOFOL N/A 07/25/2015   Procedure: ESOPHAGOGASTRODUODENOSCOPY (EGD) WITH PROPOFOL;  Surgeon: [name], MD;  Location:  [facility];  Service: Endoscopy;  Laterality: N/A;  . KNEE ARTHROSCOPY  1996  . PARTIAL COLECTOMY  2008  . THYROID CYST EXCISION    . TONSILLECTOMY      FAMILY HISTORY: Family History  Problem Relation Age of Onset  . CAD Father   . Alcohol abuse Father   . Heart disease Father   . COPD Mother   . Asthma Mother   . Emphysema Mother   . Cancer Maternal Grandmother   . Colon cancer Neg Hx  SOCIAL HISTORY: Social History   Socioeconomic History  . Marital status: Married    Spouse name: Not on file  . Number of children: 1  . Years of education: HS  . Highest education level: Not on file  Occupational History  . Occupation: retired    Fish farm manager: RETIRED  Social Needs  . Financial resource strain: Not on file  . Food insecurity    Worry: Not on file    Inability: Not on file  . Transportation needs    Medical: Not on file    Non-medical: Not on file  Tobacco Use  . Smoking status: Current Some Day Smoker    Packs/day: 1.50    Years: 50.00    Pack years: 75.00    Types: Cigarettes    Last attempt to quit: 04/29/2013    Years since quitting: 5.5  . Smokeless tobacco: Never Used  . Tobacco comment: Stateshe sometimes sneaks a cigarette  Substance and Sexual Activity  . Alcohol use: Yes    Alcohol/week: 0.0 standard drinks    Comment: once yearly  . Drug use: No  . Sexual activity: Not on file  Lifestyle  . Physical activity    Days per week: Not on file    Minutes per session: Not on file  . Stress: Not on file  Relationships  . Social Herbalist on phone: Not on file    Gets together: Not on file    Attends religious service: Not on file    Active member of club or organization: Not on file    Attends meetings of clubs or organizations: Not on file    Relationship status: Not on file  . Intimate partner violence    Fear of current or ex partner: Not on file    Emotionally abused: Not on file    Physically abused: Not on file    Forced  sexual activity: Not on file  Other Topics Concern  . Not on file  Social History Narrative   Drinks 1-2 Peps[employer] a day       PHYSICAL EXAM  Vitals:   11/29/18 1328  BP: 116/68  Pulse: 85  Temp: 97.8 F (36.6 C)  Weight: 236 lb 6.4 oz (107.2 kg)  Height: '6\' 1"'$  (1.854 m)   Body mass index is 31.19 kg/m.  Generalized: Well developed, in no acute distress  Cardiology: normal rate and rhythm, no murmur noted Neurological examination  Mentation: Alert oriented to time, place, history taking. Follows all commands speech and language fluent Cranial nerve II-XII: Pupils were equal round reactive to light. Extraocular movements were full, visual field were full on confrontational test. Facial sensation and strength were normal. Uvula tongue midline. Head turning and shoulder shrug  were normal and symmetric. Motor: The motor testing reveals 5 over 5 strength of all 4 extremities. Good symmetric motor tone is noted throughout. Tremor in upper extremities with testing L> R Sensory: Sensory testing is intact to soft touch on all 4 extremities. No evidence of extinction is noted.  Coordination: Cerebellar testing reveals slow, mildly ataxic finger-nose-finger and heel-to-shin bilaterally.  Gait and station: Gait is short but stable. Tandem not attempted. Romberg is negative. No drift is seen.    DIAGNOSTIC DATA (LABS, IMAGING, TESTING) - I reviewed patient records, labs, notes, testing and imaging myself where available.  MMSE - Mini Mental State Exam 11/29/2018  Orientation to time 5  Orientation to Place 5  Registration 3  Attention/ Calculation 4  Recall 3  Language- name 2 objects 2  Language- repeat 1  Language- follow 3 step command 3  Language- read & follow direction 1  Write a sentence 1  Write a sentence-comments drew the clock 10 past 11  Copy design 1  Copy design-comments named 11 animals  Total score 29     Lab Results  Component Value Date   WBC 8.1 09/21/2018    HGB 11.2 (L) 09/21/2018   HCT 39.5 09/21/2018   MCV 72.7 (L) 09/21/2018   PLT 307 09/21/2018      Component Value Date/Time   NA 141 03/18/2013 1422   K 3.9 03/18/2013 1422   CL 102 03/18/2013 1422   CO2 33 (H) 03/18/2013 1422   GLUCOSE 130 (H) 03/18/2013 1422   BUN 14 03/18/2013 1422   CREATININE 1.1 03/18/2013 1422   CALCIUM 9.4 03/18/2013 1422   GFRNONAA >60 02/08/2008 1431   GFRAA  02/08/2008 1431    >60        The eGFR has been calculated using the MDRD equation. This calculation has not been validated in all clinical   No results found for: CHOL, HDL, LDLCALC, LDLDIRECT, TRIG, CHOLHDL Lab Results  Component Value Date   HGBA1C  11/24/2006    5.6 (NOTE)   The ADA recommends the following therapeutic goals for glycemic   control related to Hgb A1C measurement:   Goal of Therapy:   < 7.0% Hgb A1C   Action Suggested:  > 8.0% Hgb A1C   Ref:  Diabetes Care, 75, Suppl. 1, 1999   Lab Results  Component Value Date   [id number] 443 06/15/2012   No results found for: TSH   ASSESSMENT AND PLAN 73 y.o. year old male  has a past medical history of Allergy, Blind left eye, Blood transfusion, COPD (chronic obstructive pulmonary disease) ([place]), Depression, Diabetes mellitus, type 2 ([place]), Diverticulosis, ED (erectile dysfunction), Glaucoma, Hyperlipidemia, Hypertension, Insomnia, Internal and external bleeding hemorrhoids, Iron deficiency anemia, OSA (obstructive sleep apnea), Osteoarthritis, Rosacea, Tremor of both hands, and Tubular adenoma of colon (03/2011). here with     ICD-10-CM   1. Parkinson's disease ([place])  G20 carbidopa-levodopa (SINEMET IR) 25-100 MG tablet  2. Memory loss  R41.3   3. Depression, unspecified depression type  F32.9   4. Multiple falls  R29.6     James [name] and his wife have noted more tremor, more falls, increased depression and memory loss since last being seen 1 year ago.  He is tolerating Sinemet well with no obvious adverse effects.  We will increase dose  to 2 tablets 3 times daily.  Recommended times are 10 AM, 1-2pm and 4-6pm.  He will monitor closely for any concerns of increased dizziness or other adverse effects.  Fall precautions discussed.  PT evaluation and treatment offered but patient declines at this time. MMSE is 29/30 today. I do have concerns that he is unable to recall watching a movie a day following.  I feel it is reasonable to start Aricept 5 mg at bedtime.  I have advised that he wait 1 to 2 weeks after increasing Sinemet to ensure this is well-tolerated prior to adding Aricept.  If he responds well to 5 mg daily we will increase to 10 mg daily as instructed.  His wife will call me in about a month with a progress report.  He will continue working with primary care for concerns of increased depression.  He will continue Zoloft 150 mg as well as Wellbutrin  300 mg daily.  We will follow-up in 1 year, sooner if needed.  He and his wife both verbalized understanding and agreement with this plan.   No orders of the defined types were placed in this encounter.    Meds ordered this encounter  Medications  . carbidopa-levodopa (SINEMET IR) 25-100 MG tablet    Sig: Take 2 tablets by mouth 3 (three) times daily.    Dispense:  540 tablet    Refill:  3    Order Specific Question:   Supervising Provider    Answer:   Melvenia Beam [id number]  . donepezil (ARICEPT) 5 MG tablet    Sig: Take 1 tablet (5 mg total) by mouth at bedtime.    Dispense:  90 tablet    Refill:  3    Order Specific Question:   Supervising Provider    Answer:   Melvenia Beam [id number]      I spent 40 minutes with the patient. 50% of this time was spent counseling and educating patient on plan of care and medications.    Debbora Presto, FNP-C 11/29/2018, 2:17 PM [facility] [address] [place], [place] [address] [phone]  I reviewed the above note and documentation by the Nurse Practitioner and agree with the history, exam,  assessment and plan as outlined above. I was available for consultation. Star Age, MD, PhD [facility])

## 2018-12-06 DIAGNOSIS — H401113 Primary open-angle glaucoma, right eye, severe stage: Secondary | ICD-10-CM | POA: Diagnosis not present

## 2018-12-06 DIAGNOSIS — E119 Type 2 diabetes mellitus without complications: Secondary | ICD-10-CM | POA: Diagnosis not present

## 2018-12-06 DIAGNOSIS — H2512 Age-related nuclear cataract, left eye: Secondary | ICD-10-CM | POA: Diagnosis not present

## 2018-12-06 DIAGNOSIS — H401123 Primary open-angle glaucoma, left eye, severe stage: Secondary | ICD-10-CM | POA: Diagnosis not present

## 2018-12-09 DIAGNOSIS — H401133 Primary open-angle glaucoma, bilateral, severe stage: Secondary | ICD-10-CM | POA: Diagnosis not present

## 2018-12-09 DIAGNOSIS — H25043 Posterior subcapsular polar age-related cataract, bilateral: Secondary | ICD-10-CM | POA: Diagnosis not present

## 2018-12-09 DIAGNOSIS — H2513 Age-related nuclear cataract, bilateral: Secondary | ICD-10-CM | POA: Diagnosis not present

## 2018-12-09 DIAGNOSIS — H18413 Arcus senilis, bilateral: Secondary | ICD-10-CM | POA: Diagnosis not present

## 2018-12-13 ENCOUNTER — Other Ambulatory Visit: Payer: Self-pay | Admitting: Neurology

## 2018-12-13 DIAGNOSIS — G20 Parkinson's disease: Secondary | ICD-10-CM

## 2018-12-17 ENCOUNTER — Other Ambulatory Visit: Payer: Self-pay

## 2018-12-17 DIAGNOSIS — D509 Iron deficiency anemia, unspecified: Secondary | ICD-10-CM

## 2018-12-20 ENCOUNTER — Other Ambulatory Visit: Payer: Self-pay

## 2018-12-20 ENCOUNTER — Telehealth: Payer: Self-pay

## 2018-12-20 ENCOUNTER — Inpatient Hospital Stay: Payer: Medicare Other | Attending: Hematology and Oncology

## 2018-12-20 DIAGNOSIS — D509 Iron deficiency anemia, unspecified: Secondary | ICD-10-CM

## 2018-12-20 LAB — RETICULOCYTES
Immature Retic Fract: 23.7 % — ABNORMAL HIGH (ref 2.3–15.9)
RBC.: 5.54 MIL/uL (ref 4.22–5.81)
Retic Count, Absolute: 83.1 10*3/uL (ref 19.0–186.0)
Retic Ct Pct: 1.5 % (ref 0.4–3.1)

## 2018-12-20 LAB — IRON AND TIBC
Iron: 24 ug/dL — ABNORMAL LOW (ref 42–163)
Saturation Ratios: 6 % — ABNORMAL LOW (ref 20–55)
TIBC: 421 ug/dL — ABNORMAL HIGH (ref 202–409)
UIBC: 397 ug/dL — ABNORMAL HIGH (ref 117–376)

## 2018-12-20 LAB — CBC WITH DIFFERENTIAL (CANCER CENTER ONLY)
Abs Immature Granulocytes: 0.02 10*3/uL (ref 0.00–0.07)
Basophils Absolute: 0 10*3/uL (ref 0.0–0.1)
Basophils Relative: 1 %
Eosinophils Absolute: 0.1 10*3/uL (ref 0.0–0.5)
Eosinophils Relative: 1 %
HCT: 41.7 % (ref 39.0–52.0)
Hemoglobin: 12.2 g/dL — ABNORMAL LOW (ref 13.0–17.0)
Immature Granulocytes: 0 %
Lymphocytes Relative: 17 %
Lymphs Abs: 1.2 10*3/uL (ref 0.7–4.0)
MCH: 21.9 pg — ABNORMAL LOW (ref 26.0–34.0)
MCHC: 29.3 g/dL — ABNORMAL LOW (ref 30.0–36.0)
MCV: 75 fL — ABNORMAL LOW (ref 80.0–100.0)
Monocytes Absolute: 0.7 10*3/uL (ref 0.1–1.0)
Monocytes Relative: 10 %
Neutro Abs: 5 10*3/uL (ref 1.7–7.7)
Neutrophils Relative %: 71 %
Platelet Count: 239 10*3/uL (ref 150–400)
RBC: 5.56 MIL/uL (ref 4.22–5.81)
RDW: 18.5 % — ABNORMAL HIGH (ref 11.5–15.5)
WBC Count: 7 10*3/uL (ref 4.0–10.5)
nRBC: 0 % (ref 0.0–0.2)

## 2018-12-20 LAB — FERRITIN: Ferritin: 6 ng/mL — ABNORMAL LOW (ref 24–336)

## 2018-12-20 NOTE — Telephone Encounter (Signed)
LVM at [phone] advising pt Dr Lindi Adie would like for him to get an iron infusion on 10/28.  Attempted to contact pt at [phone] and obtained msg "call cannot be completed..." High priority msg sent to scheduling department.

## 2018-12-21 ENCOUNTER — Telehealth: Payer: Self-pay | Admitting: Hematology and Oncology

## 2018-12-21 NOTE — Progress Notes (Signed)
Patient Care Team: [name], MD as PCP - General (Internal Medicine)  DIAGNOSIS:    ICD-10-CM   1. Iron deficiency anemia, unspecified iron deficiency anemia type  D50.9 Ferritin    Iron and TIBC    CBC with Differential ([facility] Only)    CHIEF COMPLIANT: Follow-up of iron deficiency anemia   INTERVAL HISTORY: James [name] is a 73 y.o. with above-mentioned history of iron deficiency anemia currently on oral iron. Labs on 12/20/18 showed: Hg 12.2, HCT 42.7, MCV 75.0, iron saturation 6%, ferritin 6. He presents to the clinic today to review his labs and for an iron infusion.  He reports that since iron tablets were started he has slightly more energy.  REVIEW OF SYSTEMS:   Constitutional: Denies fevers, chills or abnormal weight loss Eyes: Denies blurriness of vision Ears, nose, mouth, throat, and face: Denies mucositis or sore throat Respiratory: Denies cough, dyspnea or wheezes Cardiovascular: Denies palpitation, chest discomfort Gastrointestinal: Denies nausea, heartburn or change in bowel habits Skin: Denies abnormal skin rashes Lymphatics: Denies new lymphadenopathy or easy bruising Neurological: Denies numbness, tingling or new weaknesses Behavioral/Psych: Mood is stable, no new changes  Extremities: No lower extremity edema All other systems were reviewed with the patient and are negative.  I have reviewed the past medical history, past surgical history, social history and family history with the patient and they are unchanged from previous note.  ALLERGIES:  is allergic to codeine and morphine.  MEDICATIONS:  Current Outpatient Medications  Medication Sig Dispense Refill   atorvastatin (LIPITOR) 20 MG tablet Take 20 mg by mouth at bedtime.      buPROPion (WELLBUTRIN XL) 300 MG 24 hr tablet Take 300 mg by mouth daily.      calcium-vitamin D (OSCAL WITH D) 250-125 MG-UNIT per tablet Take 1 tablet by mouth daily.     carbidopa-levodopa (SINEMET IR)  25-100 MG tablet Take 2 tablets by mouth 3 (three) times daily. 540 tablet 3   cetirizine (ZYRTEC) 10 MG tablet Take 10 mg by mouth daily.     donepezil (ARICEPT) 5 MG tablet Take 1 tablet (5 mg total) by mouth at bedtime. 90 tablet 3   ferrous gluconate (FERGON) 324 MG tablet Take 1 tablet (324 mg total) by mouth daily with breakfast. 90 tablet 3   ferrous sulfate 325 (65 FE) MG tablet Take 325 mg by mouth 2 (two) times daily with a meal.      Green Tea 315 MG CAPS Take 315 mg by mouth daily.     hydrochlorothiazide (HYDRODIURIL) 25 MG tablet Take 25 mg by mouth daily.      MEGARED OMEGA-3 KRILL OIL PO Take 1 capsule by mouth daily.     metFORMIN (GLUCOPHAGE) 500 MG tablet Take 1,000 mg by mouth 2 (two) times daily with a meal.      Multiple Vitamins-Minerals (MULTIVITAMIN WITH MINERALS) tablet Take 1 tablet by mouth daily.       omeprazole (PRILOSEC OTC) 20 MG tablet Take 20 mg by mouth daily with breakfast.     sertraline (ZOLOFT) 100 MG tablet Take 150 mg by mouth daily.      vardenafil (LEVITRA) 20 MG tablet Take 20 mg by mouth daily as needed for erectile dysfunction.      No current facility-administered medications for this visit.     PHYSICAL EXAMINATION: ECOG PERFORMANCE STATUS: 1 - Symptomatic but completely ambulatory  Vitals:   12/22/18 1119  BP: (!) 146/59  Pulse: 81  Resp:  17  Temp: 98 F (36.7 C)  SpO2: 91%   Filed Weights   12/22/18 1119  Weight: 233 lb 1.6 oz (105.7 kg)    GENERAL: alert, no distress and comfortable SKIN: skin color, texture, turgor are normal, no rashes or significant lesions EYES: normal, Conjunctiva are pink and non-injected, sclera clear OROPHARYNX: no exudate, no erythema and lips, buccal mucosa, and tongue normal  NECK: supple, thyroid normal size, non-tender, without nodularity LYMPH: no palpable lymphadenopathy in the cervical, axillary or inguinal LUNGS: clear to auscultation and percussion with normal breathing  effort HEART: regular rate & rhythm and no murmurs and no lower extremity edema ABDOMEN: abdomen soft, non-tender and normal bowel sounds MUSCULOSKELETAL: no cyanosis of digits and no clubbing  NEURO: alert & oriented x 3 with fluent speech, no focal motor/sensory deficits EXTREMITIES: No lower extremity edema  LABORATORY DATA:  I have reviewed the data as listed CMP Latest Ref Rng & Units 03/18/2013 02/08/2008 11/24/2006  Glucose 70 - 99 mg/dL 130(H) 92 106(H)  BUN 6 - 23 mg/dL 14 8 10   Creatinine 0.4 - 1.5 mg/dL 1.1 0.92 0.92  Sodium 135 - 145 mEq/L 141 143 141  Potassium 3.5 - 5.1 mEq/L 3.9 4.1 4.8  Chloride 96 - 112 mEq/L 102 107 106  CO2 19 - 32 mEq/L 33(H) 31 29  Calcium 8.4 - 10.5 mg/dL 9.4 9.0 9.3    Lab Results  Component Value Date   WBC 7.0 12/20/2018   HGB 12.2 (L) 12/20/2018   HCT 41.7 12/20/2018   MCV 75.0 (L) 12/20/2018   PLT 239 12/20/2018   NEUTROABS 5.0 12/20/2018    ASSESSMENT & PLAN:  Iron deficiency anemia Lab review:  08/19/2018: Hemoglobin 11.7, MCV 74.3, RDW 18.2, rest of the CBC and platelet count normal 08/13/2017: Hemoglobin 14.8, MCV 92.4, WBC 9, platelets 245 12/20/2018: Hemoglobin 12.2, MCV 75, RDW 18.5, platelets 239, ferritin 6, iron saturation 6%, TIBC 421  Microcytic anemia: Differential diagnosis iron deficiency versus other causes of microcytosis   Current treatment: Oral iron ferrous gluconate 324 mg once a day.   Based on very severely low ferritin levels I recommended that we give Venofer 4 doses. We discussed the pros and cons of intravenous iron infusion therapy. His wife is an Therapist, sports and she is in agreement.  Return to clinic in 3 months with labs and follow-up.     Orders Placed This Encounter  Procedures   Ferritin    Standing Status:   Future    Standing Expiration Date:   12/22/2019   Iron and TIBC    Standing Status:   Future    Standing Expiration Date:   12/22/2019   CBC with Differential ([place] Only)     Standing Status:   Future    Standing Expiration Date:   12/22/2019   The patient has a good understanding of the overall plan. he agrees with it. he will call with any problems that may develop before the next visit here.  Nicholas Lose, MD 12/22/2018  Julious Oka Dorshimer am acting as scribe for Dr. Nicholas Lose.  I have reviewed the above documentation for accuracy and completeness, and I agree with the above.

## 2018-12-21 NOTE — Telephone Encounter (Signed)
Called patient to confirm upcoming appointment for tomorrow.

## 2018-12-22 ENCOUNTER — Inpatient Hospital Stay: Payer: Medicare Other

## 2018-12-22 ENCOUNTER — Telehealth: Payer: Self-pay | Admitting: Hematology and Oncology

## 2018-12-22 ENCOUNTER — Inpatient Hospital Stay (HOSPITAL_BASED_OUTPATIENT_CLINIC_OR_DEPARTMENT_OTHER): Payer: Medicare Other | Admitting: Hematology and Oncology

## 2018-12-22 ENCOUNTER — Other Ambulatory Visit: Payer: Self-pay

## 2018-12-22 VITALS — BP 148/85 | HR 70 | Temp 98.1°F | Resp 16

## 2018-12-22 DIAGNOSIS — D509 Iron deficiency anemia, unspecified: Secondary | ICD-10-CM

## 2018-12-22 MED ORDER — ACETAMINOPHEN 325 MG PO TABS
ORAL_TABLET | ORAL | Status: AC
  Filled 2018-12-22: qty 2

## 2018-12-22 MED ORDER — ACETAMINOPHEN 325 MG PO TABS
650.0000 mg | ORAL_TABLET | Freq: Once | ORAL | Status: AC
  Administered 2018-12-22: 650 mg via ORAL

## 2018-12-22 MED ORDER — SODIUM CHLORIDE 0.9 % IV SOLN
Freq: Once | INTRAVENOUS | Status: AC
  Administered 2018-12-22: 13:00:00 via INTRAVENOUS
  Filled 2018-12-22: qty 250

## 2018-12-22 MED ORDER — SODIUM CHLORIDE 0.9 % IV SOLN
200.0000 mg | Freq: Once | INTRAVENOUS | Status: AC
  Administered 2018-12-22: 13:00:00 200 mg via INTRAVENOUS
  Filled 2018-12-22: qty 10

## 2018-12-22 NOTE — Patient Instructions (Signed)

## 2018-12-22 NOTE — Assessment & Plan Note (Signed)
Lab review:  08/19/2018: Hemoglobin 11.7, MCV 74.3, RDW 18.2, rest of the CBC and platelet count normal 08/13/2017: Hemoglobin 14.8, MCV 92.4, WBC 9, platelets 245 12/20/2018: Hemoglobin 12.2, MCV 75, RDW 18.5, platelets 239, ferritin 6, iron saturation 6%, TIBC 421  Microcytic anemia: Differential diagnosis iron deficiency versus other causes of microcytosis   Current treatment: Oral iron ferrous gluconate 324 mg once a day. We discussed the pros and cons of intravenous iron infusion therapy.

## 2018-12-22 NOTE — Telephone Encounter (Signed)
Scheduled appt per 10/28 sch message - added additional Iron infusion - unable to reach pt. - unable to leave message. Mailed reminder letter

## 2018-12-29 ENCOUNTER — Inpatient Hospital Stay: Payer: Medicare Other | Attending: Hematology and Oncology

## 2018-12-29 ENCOUNTER — Other Ambulatory Visit: Payer: Self-pay

## 2018-12-29 VITALS — BP 136/65 | HR 81 | Temp 98.8°F | Resp 18

## 2018-12-29 DIAGNOSIS — D509 Iron deficiency anemia, unspecified: Secondary | ICD-10-CM | POA: Diagnosis not present

## 2018-12-29 MED ORDER — ACETAMINOPHEN 325 MG PO TABS
650.0000 mg | ORAL_TABLET | Freq: Once | ORAL | Status: AC
  Administered 2018-12-29: 16:00:00 650 mg via ORAL

## 2018-12-29 MED ORDER — ACETAMINOPHEN 325 MG PO TABS
ORAL_TABLET | ORAL | Status: AC
  Filled 2018-12-29: qty 2

## 2018-12-29 MED ORDER — SODIUM CHLORIDE 0.9 % IV SOLN
200.0000 mg | Freq: Once | INTRAVENOUS | Status: AC
  Administered 2018-12-29: 16:00:00 200 mg via INTRAVENOUS
  Filled 2018-12-29: qty 10

## 2018-12-29 MED ORDER — SODIUM CHLORIDE 0.9 % IV SOLN
Freq: Once | INTRAVENOUS | Status: AC
  Administered 2018-12-29: 16:00:00 via INTRAVENOUS
  Filled 2018-12-29: qty 250

## 2018-12-29 NOTE — Progress Notes (Signed)
Patient declined to remain for 30 minute post Venofer observation period. Vital signs taken and patient ambulatory to the lobby.

## 2018-12-29 NOTE — Patient Instructions (Signed)

## 2019-01-05 ENCOUNTER — Inpatient Hospital Stay: Payer: Medicare Other

## 2019-01-05 ENCOUNTER — Other Ambulatory Visit: Payer: Self-pay

## 2019-01-05 VITALS — BP 133/75 | HR 78 | Temp 98.1°F | Resp 18

## 2019-01-05 DIAGNOSIS — D509 Iron deficiency anemia, unspecified: Secondary | ICD-10-CM | POA: Diagnosis not present

## 2019-01-05 MED ORDER — SODIUM CHLORIDE 0.9 % IV SOLN
200.0000 mg | Freq: Once | INTRAVENOUS | Status: AC
  Administered 2019-01-05: 200 mg via INTRAVENOUS
  Filled 2019-01-05: qty 10

## 2019-01-05 MED ORDER — SODIUM CHLORIDE 0.9 % IV SOLN
Freq: Once | INTRAVENOUS | Status: AC
  Administered 2019-01-05: 16:00:00 via INTRAVENOUS
  Filled 2019-01-05: qty 250

## 2019-01-05 MED ORDER — ACETAMINOPHEN 325 MG PO TABS: 650.0000 mg | ORAL_TABLET | Freq: Once | ORAL | Status: DC

## 2019-01-05 NOTE — Patient Instructions (Signed)
Iron Sucrose injection What is this medicine? IRON SUCROSE (AHY ern SOO krohs) is an iron complex. Iron is used to make healthy red blood cells, which carry oxygen and nutrients throughout the body. This medicine is used to treat iron deficiency anemia in people with chronic kidney disease. This medicine may be used for other purposes; ask your health care provider or pharmacist if you have questions. COMMON BRAND NAME(S): Venofer What should I tell my health care provider before I take this medicine? They need to know if you have any of these conditions:  anemia not caused by low iron levels  heart disease  high levels of iron in the blood  kidney disease  liver disease  an unusual or allergic reaction to iron, other medicines, foods, dyes, or preservatives  pregnant or trying to get pregnant  breast-feeding How should I use this medicine? This medicine is for infusion into a vein. It is given by a health care professional in a hospital or clinic setting. Talk to your pediatrician regarding the use of this medicine in children. While this drug may be prescribed for children as young as 2 years for selected conditions, precautions do apply. Overdosage: If you think you have taken too much of this medicine contact a poison control center or emergency room at once. NOTE: This medicine is only for you. Do not share this medicine with others. What if I miss a dose? It is important not to miss your dose. Call your doctor or health care professional if you are unable to keep an appointment. What may interact with this medicine? Do not take this medicine with any of the following medications:  deferoxamine  dimercaprol  other iron products This medicine may also interact with the following medications:  chloramphenicol  deferasirox This list may not describe all possible interactions. Give your health care provider a list of all the medicines, herbs, non-prescription drugs, or  dietary supplements you use. Also tell them if you smoke, drink alcohol, or use illegal drugs. Some items may interact with your medicine. What should I watch for while using this medicine? Visit your doctor or healthcare professional regularly. Tell your doctor or healthcare professional if your symptoms do not start to get better or if they get worse. You may need blood work done while you are taking this medicine. You may need to follow a special diet. Talk to your doctor. Foods that contain iron include: whole grains/cereals, dried fruits, beans, or peas, leafy green vegetables, and organ meats (liver, kidney). What side effects may I notice from receiving this medicine? Side effects that you should report to your doctor or health care professional as soon as possible:  allergic reactions like skin rash, itching or hives, swelling of the face, lips, or tongue  breathing problems  changes in blood pressure  cough  fast, irregular heartbeat  feeling faint or lightheaded, falls  fever or chills  flushing, sweating, or hot feelings  joint or muscle aches/pains  seizures  swelling of the ankles or feet  unusually weak or tired Side effects that usually do not require medical attention (report to your doctor or health care professional if they continue or are bothersome):  diarrhea  feeling achy  headache  irritation at site where injected  nausea, vomiting  stomach upset  tiredness This list may not describe all possible side effects. Call your doctor for medical advice about side effects. You may report side effects to FDA at 1-800-FDA-1088. Where should I keep   my medicine? This drug is given in a hospital or clinic and will not be stored at home. NOTE: This sheet is a summary. It may not cover all possible information. If you have questions about this medicine, talk to your doctor, pharmacist, or health care provider.  2020 Elsevier/Gold Standard (2010-11-21  17:14:35) Coronavirus (COVID-19) Are you at risk?  Are you at risk for the Coronavirus (COVID-19)?  To be considered HIGH RISK for Coronavirus (COVID-19), you have to meet the following criteria:  . Traveled to [place], [place], [place], [place] or [place]; or in the [place] to [place], [place], [place], or [place]; and have fever, cough, and shortness of breath within the last 2 weeks of travel OR . Been in close contact with a person diagnosed with COVID-19 within the last 2 weeks and have fever, cough, and shortness of breath . IF YOU DO NOT MEET THESE CRITERIA, YOU ARE CONSIDERED LOW RISK FOR COVID-19.  What to do if you are HIGH RISK for COVID-19?  . If you are having a medical emergency, call 911. . Seek medical care right away. Before you go to a doctor's office, urgent care or emergency department, call ahead and tell them about your recent travel, contact with someone diagnosed with COVID-19, and your symptoms. You should receive instructions from your physician's office regarding next steps of care.  . When you arrive at healthcare provider, tell the healthcare staff immediately you have returned from visiting [place], [place], [place], [place] or [place]; or traveled in the [place] to [place], [place], [place], or [place]; in the last two weeks or you have been in close contact with a person diagnosed with COVID-19 in the last 2 weeks.   . Tell the health care staff about your symptoms: fever, cough and shortness of breath. . After you have been seen by a medical provider, you will be either: o Tested for (COVID-19) and discharged home on quarantine except to seek medical care if symptoms worsen, and asked to  - Stay home and avoid contact with others until you get your results (4-5 days)  - Avoid travel on public transportation if possible (such as bus, train, or airplane) or o Sent to the Emergency Department by EMS for evaluation, COVID-19 testing, and  possible admission depending on your condition and test results.  What to do if you are LOW RISK for COVID-19?  Reduce your risk of any infection by using the same precautions used for avoiding the common cold or flu:  . Wash your hands often with soap and warm water for at least 20 seconds.  If soap and water are not readily available, use an alcohol-based hand sanitizer with at least 60% alcohol.  . If coughing or sneezing, cover your mouth and nose by coughing or sneezing into the elbow areas of your shirt or coat, into a tissue or into your sleeve (not your hands). . Avoid shaking hands with others and consider head nods or verbal greetings only. . Avoid touching your eyes, nose, or mouth with unwashed hands.  . Avoid close contact with people who are sick. . Avoid places or events with large numbers of people in one location, like concerts or sporting events. . Carefully consider travel plans you have or are making. . If you are planning any travel outside or inside the [place], visit the CDC's Travelers' Health webpage for the latest health notices. . If you have some symptoms but not all   symptoms, continue to monitor at home and seek medical attention if your symptoms worsen. . If you are having a medical emergency, call 911.   ADDITIONAL HEALTHCARE OPTIONS FOR PATIENTS  [facility] / e-Visit: [email or web address]         [facility]: [phone]  [facility]: [phone]                   [facility]: [phone]  

## 2019-01-05 NOTE — Progress Notes (Signed)
Patient refused post Venofer infusion observation for 30 minutes. Verbal education provided and he verbalized understanding.

## 2019-01-12 ENCOUNTER — Other Ambulatory Visit: Payer: Self-pay

## 2019-01-12 ENCOUNTER — Inpatient Hospital Stay: Payer: Medicare Other

## 2019-01-12 VITALS — BP 140/83 | HR 74 | Temp 97.4°F | Resp 18

## 2019-01-12 DIAGNOSIS — H2513 Age-related nuclear cataract, bilateral: Secondary | ICD-10-CM | POA: Diagnosis not present

## 2019-01-12 DIAGNOSIS — D509 Iron deficiency anemia, unspecified: Secondary | ICD-10-CM

## 2019-01-12 DIAGNOSIS — H401133 Primary open-angle glaucoma, bilateral, severe stage: Secondary | ICD-10-CM | POA: Diagnosis not present

## 2019-01-12 MED ORDER — SODIUM CHLORIDE 0.9 % IV SOLN
200.0000 mg | Freq: Once | INTRAVENOUS | Status: AC
  Administered 2019-01-12: 200 mg via INTRAVENOUS
  Filled 2019-01-12: qty 10

## 2019-01-12 MED ORDER — SODIUM CHLORIDE 0.9 % IV SOLN
Freq: Once | INTRAVENOUS | Status: AC
  Administered 2019-01-12: 16:00:00 via INTRAVENOUS
  Filled 2019-01-12: qty 250

## 2019-01-12 MED ORDER — ACETAMINOPHEN 325 MG PO TABS: 650.0000 mg | ORAL_TABLET | Freq: Once | ORAL | Status: DC

## 2019-01-12 NOTE — Progress Notes (Signed)
Patient declined 30 minute post-observation period. RN provided education and patient verbalized understanding. No c/o discomfort or distress.

## 2019-01-12 NOTE — Patient Instructions (Signed)

## 2019-01-19 ENCOUNTER — Other Ambulatory Visit: Payer: Self-pay

## 2019-01-31 DIAGNOSIS — H2513 Age-related nuclear cataract, bilateral: Secondary | ICD-10-CM | POA: Diagnosis not present

## 2019-01-31 DIAGNOSIS — H401133 Primary open-angle glaucoma, bilateral, severe stage: Secondary | ICD-10-CM | POA: Diagnosis not present

## 2019-03-02 NOTE — Progress Notes (Addendum)
PATIENT: James [name] DOB: 1945-04-24  REASON FOR VISIT: follow up HISTORY FROM: patient  Chief Complaint  Patient presents with  . Follow-up    RM2. with wife. Difficulty finding words, shakes more often.      HISTORY OF PRESENT ILLNESS: Today 03/03/19 James [name] is a 74 y.o. male here today for follow up of PD and memory loss.  [name] reports that since last being seen tremors seem to have worsened.  He is concerned that the increased dose of Sinemet may have caused nausea and dizziness.  He does note improvement in tremors after taking Sinemet IR.  He does have a longstanding history of dizziness with position changes and is uncertain if dizziness noted since last being seen is related to Sinemet or not.  He did start Aricept 5 mg daily.  He is uncertain if this medication has helped at all.  He denies adverse effects.  He does continue to note difficulty with word recall.  Short-term memory has worsened.  He has had multiple falls.  His wife reports that he seems to get tripped up as he shuffles his feet when he walks.  This is worse when he is tired.  He has started using a single prong cane.  This is helped with stability.  Although he does feel that his appetite has declined somewhat, he continues to eat well.  He has lost about 9 pounds since last being seen.  He denies any difficulty swallowing.  He does not drive.  He continues to perform ADLs without assistance.  HISTORY: (copied from my note on 11/29/2018)  James [name] is a 74 y.o. male here today for follow up for Parkinson's Disease. He continues Sinemet 1.5 tablets TID. He does note more shaking. He does feel that he shakes a little less afterwards. Usual dosing is 10a, 4p and 10p.  He has had more falls over the past year. Maybe 8-10 falls. Once he fell about 21f on his back. He denies injuries. His wife reports that his steps are getting shorter. He participated in PT (around 04/2015) but doesn't feel that it  helped.  He is more depressed. He is working with PCP and taking Zoloft '150mg'$  and Wellbutrin '300mg'$   daily. He does feel that memory is declining. He watched a movie with his wife recently but did not remember it the next day. He is doing well otherwise. He [place]. His wife is a mMicrobiologist[place]   HISTORY: (copied from [place] on 11/26/2017)  James [name] a 74year old male with a history of Parkinson's disease. He returns today for follow-up. He is currently on Sinemet 1/2 tablet 3 times a day. He feels overall that his tremor may have gotten slightly worse. He notices the tremor primarily in the upper extremities and in the left leg. No change ingait he does report that his balance is off. He states that over the summer he was getting out of the pool and he did fall. Fortunately did not suffer any injuries. He does not use a cane or walker. Denies any trouble swallowing. Denies eating choked on food or liquids. He reports that his sleep is fragmented. He states that he tends to wake up every hour during the night. For that reason he tends to nap a lot during the day. He returns today for evaluation.  HISTORY04/01/19 James [name] a 74year old male with a history of Parkinson's disease. He  returns today for follow-up. He reports that he is doing fairly well. Continues on Sinemet 1-1/2 tablets 3 times a day. He reports that he has a tremor in the right hand that may have gotten slightly worse. In regards to his gait or balance he reports that he has had more episodes of tripping but no falls. He states that he is trying to continue doing the exercises he learned in physical therapy. Denies any changes with the bowels or bladder. Denies any changes withswallowing or chewing food. Reports trouble sleeping. Tends to go to bed around 4 AM and wake up at 8 AM and naps frequently throughout the day. He states that he tries to  stay well-hydrated with water. Returns today for evaluation.   REVIEW OF SYSTEMS: Out of a complete 14 system review of symptoms, the patient complains only of the following symptoms, tremor, dizziness, memory loss and all other reviewed systems are negative.  ALLERGIES: Allergies  Allergen Reactions  . Codeine Itching  . Morphine Itching    HOME MEDICATIONS: Outpatient Medications Prior to Visit  Medication Sig Dispense Refill  . atorvastatin (LIPITOR) 20 MG tablet Take 20 mg by mouth at bedtime.     Marland Kitchen buPROPion (WELLBUTRIN XL) 300 MG 24 hr tablet Take 300 mg by mouth daily.     . calcium-vitamin D (OSCAL WITH D) 250-125 MG-UNIT per tablet Take 1 tablet by mouth daily.    . cetirizine (ZYRTEC) 10 MG tablet Take 10 mg by mouth daily.    Marland Kitchen donepezil (ARICEPT) 5 MG tablet Take 1 tablet (5 mg total) by mouth at bedtime. 90 tablet 3  . ferrous gluconate (FERGON) 324 MG tablet Take 1 tablet (324 mg total) by mouth daily with breakfast. 90 tablet 3  . Green Tea 315 MG CAPS Take 315 mg by mouth daily.    . hydrochlorothiazide (HYDRODIURIL) 25 MG tablet Take 25 mg by mouth daily.     Marland Kitchen MEGARED OMEGA-3 KRILL OIL PO Take 1 capsule by mouth daily.    . metFORMIN (GLUCOPHAGE) 500 MG tablet Take 1,000 mg by mouth 2 (two) times daily with a meal.     . Multiple Vitamins-Minerals (MULTIVITAMIN WITH MINERALS) tablet Take 1 tablet by mouth daily.      Marland Kitchen omeprazole (PRILOSEC OTC) 20 MG tablet Take 20 mg by mouth daily with breakfast.    . sertraline (ZOLOFT) 100 MG tablet Take 150 mg by mouth daily.     . vardenafil (LEVITRA) 20 MG tablet Take 20 mg by mouth daily as needed for erectile dysfunction.     . carbidopa-levodopa (SINEMET IR) 25-100 MG tablet Take 2 tablets by mouth 3 (three) times daily. 540 tablet 3  . ferrous sulfate 325 (65 FE) MG tablet Take 325 mg by mouth 2 (two) times daily with a meal.      No facility-administered medications prior to visit.    PAST MEDICAL HISTORY: Past  Medical History:  Diagnosis Date  . Allergy    seasonal  . Blind left eye    legally  . Blood transfusion    2002  . COPD (chronic obstructive pulmonary disease) ([place])   . Depression   . Diabetes mellitus, type 2 ([place])   . Diverticulosis   . ED (erectile dysfunction)   . Glaucoma   . Hyperlipidemia   . Hypertension   . Insomnia   . Internal and external bleeding hemorrhoids   . Iron deficiency anemia   . OSA (obstructive sleep apnea)   .  Osteoarthritis   . Rosacea   . Tremor of both hands   . Tubular adenoma of colon 03/2011    PAST SURGICAL HISTORY: Past Surgical History:  Procedure Laterality Date  . ARTHROSCOPIC REPAIR ACL    . CHOLECYSTECTOMY    . [place]   right  . COLONOSCOPY WITH PROPOFOL N/A 07/25/2015   Procedure: COLONOSCOPY WITH PROPOFOL;  Surgeon: [name], MD;  Location: [facility];  Service: Endoscopy;  Laterality: N/A;  . ESOPHAGOGASTRODUODENOSCOPY (EGD) WITH PROPOFOL N/A 07/25/2015   Procedure: ESOPHAGOGASTRODUODENOSCOPY (EGD) WITH PROPOFOL;  Surgeon: [name], MD;  Location: [facility];  Service: Endoscopy;  Laterality: N/A;  . KNEE ARTHROSCOPY  1996  . PARTIAL COLECTOMY  2008  . THYROID CYST EXCISION    . TONSILLECTOMY      FAMILY HISTORY: Family History  Problem Relation Age of Onset  . CAD Father   . Alcohol abuse Father   . Heart disease Father   . COPD Mother   . Asthma Mother   . Emphysema Mother   . Cancer Maternal Grandmother   . Colon cancer Neg Hx     SOCIAL HISTORY: Social History   Socioeconomic History  . Marital status: Married    Spouse name: Not on file  . Number of children: 1  . Years of education: HS  . Highest education level: Not on file  Occupational History  . Occupation: retired    Fish farm manager: RETIRED  Tobacco Use  . Smoking status: Current Some Day Smoker    Packs/day: 1.50    Years: 50.00    Pack years: 75.00    Types: Cigarettes    Last attempt to quit: 04/29/2013     Years since quitting: 5.8  . Smokeless tobacco: Never Used  . Tobacco comment: Stateshe sometimes sneaks a cigarette  Substance and Sexual Activity  . Alcohol use: Yes    Alcohol/week: 0.0 standard drinks    Comment: once yearly  . Drug use: No  . Sexual activity: Not on file  Other Topics Concern  . Not on file  Social History Narrative   Drinks 1-2 Pepsi a day    Social Determinants of Health   Financial Resource Strain:   . Difficulty of Paying Living Expenses: Not on file  Food Insecurity:   . Worried About Charity fundraiser in the Last Year: Not on file  . Ran Out of Food in the Last Year: Not on file  Transportation Needs:   . Lack of Transportation (Medical): Not on file  . Lack of Transportation (Non-Medical): Not on file  Physical Activity:   . Days of Exercise per Week: Not on file  . Minutes of Exercise per Session: Not on file  Stress:   . Feeling of Stress : Not on file  Social Connections:   . Frequency of Communication with Friends and Family: Not on file  . Frequency of Social Gatherings with Friends and Family: Not on file  . Attends Religious Services: Not on file  . Active Member of Clubs or Organizations: Not on file  . Attends Archivist Meetings: Not on file  . Marital Status: Not on file  Intimate Partner Violence:   . Fear of Current or Ex-Partner: Not on file  . Emotionally Abused: Not on file  . Physically Abused: Not on file  . Sexually Abused: Not on file      PHYSICAL EXAM  Vitals:   03/03/19 0815  BP: (!) 150/72  Pulse: 77  Temp: (!) 97.5 F (36.4 C)  Weight: 227 lb (103 kg)  Height: '6\' 1"'$  (1.854 m)   Body mass index is 29.95 kg/m.  Generalized: Well developed, in no acute distress  Cardiology: normal rate and rhythm, no murmur noted Respiratory: Clear to auscultation bilaterally Neurological examination  Mentation: Alert oriented to time, place, history taking. Follows all commands speech and language  fluent Cranial nerve II-XII: Pupils were equal round reactive to light. Extraocular movements were full, visual field were full on confrontational test. Facial sensation and strength were normal. Uvula tongue midline. Head turning and shoulder shrug  were normal and symmetric. Motor: The motor testing reveals 5 over 5 strength of all 4 extremities. Good symmetric motor tone is noted throughout. Tremor noted L>R, Bradykinesia noted with finger taps, no cogwheel rigidity, normal toe tap Sensory: Sensory testing is intact to soft touch on all 4 extremities. No evidence of extinction is noted.  Coordination: Cerebellar testing reveals good finger-nose-finger and heel-to-shin bilaterally.  Gait and station: Gait is short but stable without cane. Tandem gait is unstable. Romberg negative  Reflexes: Deep tendon reflexes are symmetric and normal bilaterally.   DIAGNOSTIC DATA (LABS, IMAGING, TESTING) - I reviewed patient records, labs, notes, testing and imaging myself where available.  MMSE - Mini Mental State Exam 03/03/2019 11/29/2018  Orientation to time 4 5  Orientation to Place 5 5  Registration 3 3  Attention/ Calculation 4 4  Recall 2 3  Language- name 2 objects 2 2  Language- repeat 1 1  Language- follow 3 step command 3 3  Language- read & follow direction 1 1  Write a sentence 1 1  Write a sentence-comments - drew the clock 10 past 11  Copy design 1 1  Copy design-comments 12 animals named named 11 animals  Total score 27 29     Lab Results  Component Value Date   WBC 7.0 12/20/2018   HGB 12.2 (L) 12/20/2018   HCT 41.7 12/20/2018   MCV 75.0 (L) 12/20/2018   PLT 239 12/20/2018      Component Value Date/Time   NA 141 03/18/2013 1422   K 3.9 03/18/2013 1422   CL 102 03/18/2013 1422   CO2 33 (H) 03/18/2013 1422   GLUCOSE 130 (H) 03/18/2013 1422   BUN 14 03/18/2013 1422   CREATININE 1.1 03/18/2013 1422   CALCIUM 9.4 03/18/2013 1422   GFRNONAA >60 02/08/2008 1431   GFRAA   02/08/2008 1431    >60        The eGFR has been calculated using the MDRD equation. This calculation has not been validated in all clinical   No results found for: CHOL, HDL, LDLCALC, LDLDIRECT, TRIG, CHOLHDL Lab Results  Component Value Date   HGBA1C  11/24/2006    5.6 (NOTE)   The ADA recommends the following therapeutic goals for glycemic   control related to Hgb A1C measurement:   Goal of Therapy:   < 7.0% Hgb A1C   Action Suggested:  > 8.0% Hgb A1C   Ref:  Diabetes Care, 9, Suppl. 1, 1999   Lab Results  Component Value Date   [id number] 431 06/15/2012   No results found for: TSH     ASSESSMENT AND PLAN 74 y.o. year old male  has a past medical history of Allergy, Blind left eye, Blood transfusion, COPD (chronic obstructive pulmonary disease) ([place]), Depression, Diabetes mellitus, type 2 ([place]), Diverticulosis, ED (erectile dysfunction), Glaucoma, Hyperlipidemia, Hypertension, Insomnia,  Internal and external bleeding hemorrhoids, Iron deficiency anemia, OSA (obstructive sleep apnea), Osteoarthritis, Rosacea, Tremor of both hands, and Tubular adenoma of colon (03/2011). here with     ICD-10-CM   1. Parkinson's disease ([place])  [place] Ambulatory referral to Physical Therapy    Ambulatory referral to Occupational Therapy    Ambulatory referral to Speech Therapy    carbidopa-levodopa (SINEMET IR) 25-100 MG tablet    Carbidopa-Levodopa ER (SINEMET CR) 25-100 MG tablet controlled release  2. Memory loss  R41.3 Ambulatory referral to Speech Therapy  3. Frequent falls  R29.6 Ambulatory referral to Physical Therapy  4. Tremor  R25.1 Ambulatory referral to Occupational Therapy    James [name] is concerned of potential increased dizziness with increased dose of Sinemet IR.  He does feel that dose has helped with his tremor somewhat.  We will reduce dose to 1.5 tablets 3 times daily.  I will add Sinemet CR 25-100 mg twice daily.  I am hopeful that this may decrease side effects and offer extended  control of tremor.  We have discussed increasing Aricept to 10 mg daily.  Patient does not wish to do so at this time.  He is uncertain if this medication has been beneficial.  With 5 mg daily for now.  I have strongly recommended an evaluation with physical, occupational and speech therapy.  He agrees and orders have been placed.  Fall precautions discussed.  I have advised that he use his cane consistently for stabilization.  May consider a walker pending PT evaluation.  We will monitor his weight closely.  He feels that he is eating well but I have noted weight loss since last visit.  He will follow-up with me in 3 months, sooner if needed.  He verbalizes understanding and agreement with this plan.   Orders Placed This Encounter  Procedures  . Ambulatory referral to Physical Therapy    Referral Priority:   Routine    Referral Type:   Physical Medicine    Referral Reason:   Specialty Services Required    Requested Specialty:   Physical Therapy    Number of Visits Requested:   1  . Ambulatory referral to Occupational Therapy    Referral Priority:   Routine    Referral Type:   Occupational Therapy    Referral Reason:   Specialty Services Required    Requested Specialty:   Occupational Therapy    Number of Visits Requested:   1  . Ambulatory referral to Speech Therapy    Referral Priority:   Routine    Referral Type:   Speech Therapy    Referral Reason:   Specialty Services Required    Requested Specialty:   Speech Pathology    Number of Visits Requested:   1     Meds ordered this encounter  Medications  . carbidopa-levodopa (SINEMET IR) 25-100 MG tablet    Sig: Take 1.5 tablets by mouth 3 (three) times daily.    Dispense:  405 tablet    Refill:  3    Order Specific Question:   Supervising Provider    Answer:   Melvenia Beam [id number]  . Carbidopa-Levodopa ER (SINEMET CR) 25-100 MG tablet controlled release    Sig: Take 1 tablet by mouth 2 (two) times daily.    Dispense:  180  tablet    Refill:  3    Order Specific Question:   Supervising Provider    Answer:   Melvenia Beam [id number]  I spent 15 minutes with the patient. 50% of this time was spent counseling and educating patient on plan of care and medications.    Debbora Presto, FNP-C 03/03/2019, 11:18 AM [facility] [address], [place], [place] [address] [phone]  I reviewed the above note and documentation by the Nurse Practitioner and agree with the history, exam, assessment and plan as outlined above. I was available for consultation. Star Age, MD, PhD [facility])

## 2019-03-03 ENCOUNTER — Ambulatory Visit (INDEPENDENT_AMBULATORY_CARE_PROVIDER_SITE_OTHER): Payer: Medicare Other | Admitting: Family Medicine

## 2019-03-03 ENCOUNTER — Encounter: Payer: Self-pay | Admitting: Family Medicine

## 2019-03-03 ENCOUNTER — Other Ambulatory Visit: Payer: Self-pay

## 2019-03-03 VITALS — BP 150/72 | HR 77 | Temp 97.5°F | Ht 73.0 in | Wt 227.0 lb

## 2019-03-03 DIAGNOSIS — R413 Other amnesia: Secondary | ICD-10-CM

## 2019-03-03 DIAGNOSIS — R296 Repeated falls: Secondary | ICD-10-CM | POA: Diagnosis not present

## 2019-03-03 DIAGNOSIS — G20 Parkinson's disease: Secondary | ICD-10-CM | POA: Diagnosis not present

## 2019-03-03 DIAGNOSIS — R251 Tremor, unspecified: Secondary | ICD-10-CM

## 2019-03-03 MED ORDER — CARBIDOPA-LEVODOPA ER 25-100 MG PO TBCR: 1.0000 | EXTENDED_RELEASE_TABLET | Freq: Two times a day (BID) | ORAL | 3 refills | Status: DC

## 2019-03-03 MED ORDER — CARBIDOPA-LEVODOPA 25-100 MG PO TABS: 1.5000 | ORAL_TABLET | Freq: Three times a day (TID) | ORAL | 3 refills | Status: DC

## 2019-03-03 NOTE — Patient Instructions (Signed)
We will resume Sinemet IR 1.5 tablets three times daily. I am adding Sinemet CR 1 tablet twice daily  Continue Aricept 5mg  daily  We will refer you for neuro PT/OT and ST  Please use cane when walking for stability, fall precautions   Follow up with me in 3 months, sooner if needed    Carbidopa; Levodopa sustained-release tablets What is this medicine? CARBIDOPA; LEVODOPA (kar bi DOE pa; lee voe DOE pa) is used to treat the symptoms of Parkinson's disease. This medicine may be used for other purposes; ask your health care provider or pharmacist if you have questions. COMMON BRAND NAME(S): SINEMET, SINEMET CR What should I tell my health care provider before I take this medicine? They need to know if you have any of these conditions:  depression or other mental illness  diabetes  glaucoma  heart disease, including history of a heart attack  history of irregular heartbeat  kidney disease  liver disease  lung or breathing disease, like asthma  narcolepsy  sleep apnea  stomach or intestine problems  an unusual or allergic reaction to levodopa, carbidopa, other medicines, foods, dyes, or preservatives  pregnant or trying to get pregnant  breast-feeding How should I use this medicine? Take this medicine by mouth with a glass of water. Follow the directions on the prescription label. Swallow whole. Do not crush or chew. You may cut the tablets in half. Take your doses at regular intervals. Do not take your medicine more often than directed. Do not stop taking except on the advice of your doctor or health care professional. Talk to your pediatrician regarding the use of this medicine in children. Special care may be needed. Overdosage: If you think you have taken too much of this medicine contact a poison control center or emergency room at once. NOTE: This medicine is only for you. Do not share this medicine with others. What if I miss a dose? If you miss a dose, take it  as soon as you can. If it is almost time for your next dose, take only that dose. Do not take double or extra doses. What may interact with this medicine? Do not take this medicine with any of the following medications:  MAOIs like Marplan, Nardil, and Parnate  reserpine  tetrabenazine This medicine may also interact with the following medications:  alcohol  droperidol  entacapone  iron supplements or multivitamins with iron  isoniazid, INH  linezolid  medicines for depression, anxiety, or psychotic disturbances  medicines for high blood pressure  medicines for sleep  metoclopramide  papaverine  procarbazine  tedizolid  rasagiline  selegiline  tolcapone This list may not describe all possible interactions. Give your health care provider a list of all the medicines, herbs, non-prescription drugs, or dietary supplements you use. Also tell them if you smoke, drink alcohol, or use illegal drugs. Some items may interact with your medicine. What should I watch for while using this medicine? Visit your health care professional for regular checks on your progress. Tell your health care professional if your symptoms do not start to get better or if they get worse. Do not stop taking except on your health care professional's advice. You may develop a severe reaction. Your health care professional will tell you how much medicine to take. You may get drowsy or dizzy. Do not drive, use machinery, or do anything that needs mental alertness until you know how this drug affects you. Do not stand or sit up quickly, especially  if you are an older patient. This reduces the risk of dizzy or fainting spells. Alcohol may interfere with the effect of this medicine. Avoid alcoholic drinks. When taking this medicine, you may fall asleep without notice. You may be doing activities like driving a car, talking, or eating. You may not feel drowsy before it happens. Contact your health care provider  right away if this happens to you. There have been reports of increased sexual urges or other strong urges such as gambling while taking this medicine. If you experience any of these while taking this medicine, you should report this to your health care provider as soon as possible. You may experience a "wearing off" effect prior to the time for your next dose of this medicine. You may also experience an "on-off" effect where the medicine apparently stops working for anything from a minute to several hours, then suddenly starts working again. Tell your doctor or health care professional if any of these symptoms happen to you. Your dose may need adjustment. A high protein diet can slow or prevent absorption of this medicine. Avoid high protein foods near the time of taking this medicine to help to prevent these problems. Take this medicine at least 30 minutes before eating or one hour after meals. You may want to eat higher protein foods later in the day or in small amounts. Discuss your diet with your doctor or health care professional or nutritionist. If you have diabetes, you may get a false-positive result for sugar in your urine. Check with your doctor or health care professional. This medicine may discolor the urine or sweat, making it look darker or red in color. This is of no cause for concern. However, this may stain clothing or fabrics. This medicine may cause a decrease in vitamin B6. You should make sure that you get enough vitamin B6 while you are taking this medicine. Discuss the foods you eat and the vitamins you take with your health care professional. What side effects may I notice from receiving this medicine? Side effects that you should report to your doctor or health care professional as soon as possible:  allergic reactions like skin rash, itching or hives, swelling of the face, lips, or tongue  changes in emotions or moods  falling asleep during normal activities like  driving  fast, irregular heartbeat  feeling faint or lightheaded, falls  fever  hallucinations  new or increased gambling urges, sexual urges, uncontrolled spending, binge or compulsive eating, or other urges  stomach pain  trouble passing urine or change in the amount of urine  uncontrollable movements of the arms, face, head, mouth, neck, or upper body Side effects that usually do not require medical attention (report to your doctor or health care professional if they continue or are bothersome):  dizziness  headache  loss of appetite  nausea  trouble sleeping This list may not describe all possible side effects. Call your doctor for medical advice about side effects. You may report side effects to FDA at 1-800-FDA-1088. Where should I keep my medicine? Keep out of the reach of children. Store below 30 degrees C (86 degrees F). Keep container tightly closed. Throw away any unused medicine after the expiration date. NOTE: This sheet is a summary. It may not cover all possible information. If you have questions about this medicine, talk to your doctor, pharmacist, or health care provider.  2020 Elsevier/Gold Standard (2018-10-18 19:49:11)   Memory Compensation Strategies  1. Use "WARM" strategy.  W=  write it down  A= associate it  R= repeat it  M= make a mental note  2.   You can keep a Social worker.  Use a 3-ring notebook with sections for the following: calendar, important names and phone numbers,  medications, doctors' names/phone numbers, lists/reminders, and a section to journal what you did  each day.   3.    Use a calendar to write appointments down.  4.    Write yourself a schedule for the day.  This can be placed on the calendar or in a separate section of the Memory Notebook.  Keeping a  regular schedule can help memory.  5.    Use medication organizer with sections for each day or morning/evening pills.  You may need help loading it  6.    Keep a  basket, or pegboard by the door.  Place items that you need to take out with you in the basket or on the pegboard.  You may also want to  include a message board for reminders.  7.    Use sticky notes.  Place sticky notes with reminders in a place where the task is performed.  For example: " turn off the  stove" placed by the stove, "lock the door" placed on the door at eye level, " take your medications" on  the bathroom mirror or by the place where you normally take your medications.  8.    Use alarms/timers.  Use while cooking to remind yourself to check on food or as a reminder to take your medicine, or as a  reminder to make a call, or as a reminder to perform another task, etc.   Fall Prevention in [place], Adult Being a patient in the hospital puts you at risk for falling. Falls can cause serious injury and harm, but they can be prevented. It is important to understand what puts you at risk for falling and what you and your health care team can do to prevent you from falling. If you or a loved one falls at the hospital, it is important to tell hospital staff about it. What increases the risk for falls? Certain conditions and treatments may increase your risk of falling in the hospital. These include:  Being in an unfamiliar environment, especially when using the bathroom at night.  Having surgery.  Being on bed rest.  Taking many medicines or certain types of medicines, such as sleeping pills.  Having tubes in place, such as IV lines or catheters. Other risk factors for falls in a hospital include:  Having difficulty with hearing or vision.  Having a change in thinking or behavior, such as confusion.  Having depression.  Having trouble with balance.  Being a male.  Feeling dizzy.  Needing to use the toilet frequently.  Having fallen during the past three months.  Having low blood pressure. What are some strategies for preventing falls? If you or a loved one has to  stay in the hospital:  Ask about which fall prevention strategies will be in place. Do not hesitate to speak up if you notice that the fall prevention plan has changed.  Ask for help moving around, especially after surgery or when feeling unwell.  If you have been asked to call for help when getting up, do not get up by yourself. Asking for help with getting up is for your safety, and the staff is there to help you.  Wear nonskid footwear.  Get up slowly, and sit at  the side of the bed for a few minutes before standing up.  Keep items you need, such as the nurse call button or a phone, close to you so that you do not need to reach for them.  Wear eyeglasses or hearing aids if you have them.  Have someone stay in the hospital with you or your loved one.  Ask if sleeping pills or other medicines that can cause confusion are necessary. What does the hospital staff do to help prevent falls? Hospitals have systems in place to prevent falls and accidents, which may involve:  Discussing your fall risks and making a personalized fall prevention plan.  Checking in regularly to see if you need help.  Placing an arm band on your wrist or a sign near your room to alert other staff of your needs.  Using an alarm on your hospital bed. This is an alarm that goes off if you get out of bed and forget to call for help.  Keeping the bed in a low and locked position.  Keeping the area around the bed and bathroom well-lit and free from clutter.  Keeping your room quiet, so that you can sleep and be well-rested.  Using safety equipment, such as: ? A belt around your waist. ? Walkers, crutches, and other devices for support. ? Safety beds, such as low beds or cushions on the floor next to the bed.  Having a staff person stay with you (one-on-one observation), even when you are using the bathroom. This is for your safety.  Using video monitoring. This allows a staff member to come to help you if  you need help. What other actions can I take to lower my risk of falls?  Check in regularly with your health care provider or pharmacist to review all of the medicines that you take.  Make sure that you have a regular exercise program to stay fit. This will help you maintain your balance.  Talk with a physical therapist or trainer if recommended by your health care provider. They can help you to improve your strength, balance, and endurance.  If you are over age 38: ? Ask your health care provider if you need a calcium or vitamin D supplement. ? Have your eyes and hearing checked every year. ? Have your feet checked every year. Where to find more information You can find more information about fall prevention from the Centers for Disease Control and Prevention: ImproveLook.cz Summary  Being in an unfamiliar environment, such as the hospital, increases your risk for falling.  If you have been asked to call for help when getting up, do not get up by yourself. Asking for help with getting up is for your safety, and the staff is there to help you.  Ask about which fall prevention strategies will be in place. Do not hesitate to speak up if you notice that the fall prevention plan has changed.  If you or a loved one falls, tell the hospital staff. This is important. This information is not intended to replace advice given to you by your health care provider. Make sure you discuss any questions you have with your health care provider. Document Revised: 01/23/2017 Document Reviewed: 09/24/2016 Elsevier Patient Education  [place] [facility] Parkinson's disease causes problems with movements. It is a long-term condition. It gets worse over time (is progressive). It affects each person in different ways. It makes it harder for you to:  Control how your body moves.  Move your body normally. The condition can range from mild to very bad (advanced). What are the  causes? This condition results from a loss of brain cells called neurons. These brain cells make a chemical called dopamine, which is needed to control body movement. As the condition gets worse, the brain cells make less dopamine. This makes it hard to move or control your movements. The exact cause of this condition is not known. What increases the risk?  Being male.  Being age 95 or older.  Having family members who had Parkinson's disease.  Having had an injury to the brain.  Being very sad (depressed).  Being around things that are harmful or poisonous. What are the signs or symptoms? Symptoms of this condition can vary. The main symptoms have to do with movement. These include:  A tremor or shaking while you are resting that you cannot control.  Stiffness in your neck, arms, and legs.  Slowing of movement. This may include: ? Losing expressions of the face. ? Having trouble making small movements that are needed to button your clothing or brush your teeth.  Walking in a way that is not normal. You may walk with short, shuffling steps.  Loss of balance when standing. You may sway, fall backward, or have trouble making turns. Other symptoms include:  Being very sad, worried, or confused.  Seeing or hearing things that are not real.  Losing thinking abilities (dementia).  Trouble speaking or swallowing.  Having a hard time pooping (constipation).  Needing to pee right away, peeing often, or not being able to control when you pee or poop.  Sleep problems. How is this treated? There is no cure. The goal of treatment is to manage your symptoms. Treatment may include:  Medicines.  Therapy to help with talking or movement.  Surgery to reduce shaking and other movements that you cannot control. Follow these instructions at home: Medicines  Take over-the-counter and prescription medicines only as told by your doctor.  Avoid taking pain or sleeping  medicines. Eating and drinking  Follow instructions from your doctor about what you cannot eat or drink.  Do not drink alcohol. Activity  Talk with your doctor about if it is safe for you to drive.  Do exercises as told by your doctor. Lifestyle      Put in grab bars and railings in your home. These help to prevent falls.  Do not use any products that contain nicotine or tobacco, such as cigarettes, e-cigarettes, and chewing tobacco. If you need help quitting, ask your doctor.  Join a support group. General instructions  Talk with your doctor about what you need help with and what your safety needs are.  Keep all follow-up visits as told by your doctor, including any therapy visits to help with talking or moving. This is important. Contact a doctor if:  Medicines do not help your symptoms.  You feel off-balance.  You fall at home.  You need more help at home.  You have trouble swallowing.  You have a very hard time pooping.  You have a lot of side effects from your medicines.  You feel very sad, worried, or confused. Get help right away if:  You were hurt in a fall.  You see or hear things that are not real.  You cannot swallow without choking.  You have chest pain or trouble breathing.  You do not feel safe at home.  You have thoughts about hurting yourself or others. If you  ever feel like you may hurt yourself or others, or have thoughts about taking your own life, get help right away. You can go to your nearest emergency department or call:  Your local emergency services (911 in the [place]  A suicide crisis helpline, such as the [facility] at (254)647-8581. This is open 24 hours a day. Summary  This condition causes problems with movements.  It is a long-term condition. It gets worse over time.  There is no cure. Treatment focuses on managing your symptoms.  Talk with your doctor about what you need help with and what  your safety needs are.  Keep all follow-up visits as told by your doctor. This is important. This information is not intended to replace advice given to you by your health care provider. Make sure you discuss any questions you have with your health care provider. Document Revised: 04/29/2018 Document Reviewed: 04/29/2018 Elsevier Patient Education  [place].

## 2019-03-21 ENCOUNTER — Telehealth: Payer: Self-pay | Admitting: *Deleted

## 2019-03-21 ENCOUNTER — Inpatient Hospital Stay: Payer: Medicare Other

## 2019-03-21 NOTE — Telephone Encounter (Signed)
Attempt x1 to contact pt regarding missed lab apt today.  No answer, LVM to return call to the office to reschedule lab apt for tomorrow.

## 2019-03-22 ENCOUNTER — Inpatient Hospital Stay: Payer: Medicare Other | Attending: Hematology and Oncology

## 2019-03-22 ENCOUNTER — Other Ambulatory Visit: Payer: Self-pay

## 2019-03-22 DIAGNOSIS — D509 Iron deficiency anemia, unspecified: Secondary | ICD-10-CM | POA: Insufficient documentation

## 2019-03-22 LAB — CBC WITH DIFFERENTIAL (CANCER CENTER ONLY)
Abs Immature Granulocytes: 0.03 10*3/uL (ref 0.00–0.07)
Basophils Absolute: 0 10*3/uL (ref 0.0–0.1)
Basophils Relative: 1 %
Eosinophils Absolute: 0.1 10*3/uL (ref 0.0–0.5)
Eosinophils Relative: 2 %
HCT: 43 % (ref 39.0–52.0)
Hemoglobin: 13.5 g/dL (ref 13.0–17.0)
Immature Granulocytes: 1 %
Lymphocytes Relative: 21 %
Lymphs Abs: 1.3 10*3/uL (ref 0.7–4.0)
MCH: 26 pg (ref 26.0–34.0)
MCHC: 31.4 g/dL (ref 30.0–36.0)
MCV: 82.9 fL (ref 80.0–100.0)
Monocytes Absolute: 0.7 10*3/uL (ref 0.1–1.0)
Monocytes Relative: 11 %
Neutro Abs: 4.1 10*3/uL (ref 1.7–7.7)
Neutrophils Relative %: 64 %
Platelet Count: 220 10*3/uL (ref 150–400)
RBC: 5.19 MIL/uL (ref 4.22–5.81)
RDW: 19.1 % — ABNORMAL HIGH (ref 11.5–15.5)
WBC Count: 6.3 10*3/uL (ref 4.0–10.5)
nRBC: 0 % (ref 0.0–0.2)

## 2019-03-22 LAB — IRON AND TIBC
Iron: 36 ug/dL — ABNORMAL LOW (ref 42–163)
Saturation Ratios: 10 % — ABNORMAL LOW (ref 20–55)
TIBC: 375 ug/dL (ref 202–409)
UIBC: 339 ug/dL (ref 117–376)

## 2019-03-22 LAB — FERRITIN: Ferritin: 8 ng/mL — ABNORMAL LOW (ref 24–336)

## 2019-03-23 NOTE — Progress Notes (Signed)
Patient Care Team: [name], MD as PCP - General (Internal Medicine)  DIAGNOSIS:    ICD-10-CM   1. Iron deficiency anemia, unspecified iron deficiency anemia type  D50.9     CHIEF COMPLIANT: Follow-up of iron deficiency anemia   INTERVAL HISTORY: James [name] is a 74 y.o. with above-mentioned history of iron deficiency anemia currently on oral iron and receiving IV iron (last 01/12/19). Labs on 12/20/18 showed: Hg 13.5, HCT 43.0, MCV 82.9, iron saturation 10%, ferritin 8. He presents to the clinic today to review his labs.  He had remarkable improvement in his energy levels after the infusion of iron.  ALLERGIES:  is allergic to codeine and morphine.  MEDICATIONS:  Current Outpatient Medications  Medication Sig Dispense Refill  . atorvastatin (LIPITOR) 20 MG tablet Take 20 mg by mouth at bedtime.     Marland Kitchen buPROPion (WELLBUTRIN XL) 300 MG 24 hr tablet Take 300 mg by mouth daily.     . calcium-vitamin D (OSCAL WITH D) 250-125 MG-UNIT per tablet Take 1 tablet by mouth daily.    . carbidopa-levodopa (SINEMET IR) 25-100 MG tablet Take 1.5 tablets by mouth 3 (three) times daily. 405 tablet 3  . Carbidopa-Levodopa ER (SINEMET CR) 25-100 MG tablet controlled release Take 1 tablet by mouth 2 (two) times daily. 180 tablet 3  . cetirizine (ZYRTEC) 10 MG tablet Take 10 mg by mouth daily.    Marland Kitchen donepezil (ARICEPT) 5 MG tablet Take 1 tablet (5 mg total) by mouth at bedtime. 90 tablet 3  . ferrous gluconate (FERGON) 324 MG tablet Take 1 tablet (324 mg total) by mouth daily with breakfast. 90 tablet 3  . ferrous sulfate 325 (65 FE) MG tablet Take 325 mg by mouth 2 (two) times daily with a meal.     . Green Tea 315 MG CAPS Take 315 mg by mouth daily.    . hydrochlorothiazide (HYDRODIURIL) 25 MG tablet Take 25 mg by mouth daily.     Marland Kitchen MEGARED OMEGA-3 KRILL OIL PO Take 1 capsule by mouth daily.    . metFORMIN (GLUCOPHAGE) 500 MG tablet Take 1,000 mg by mouth 2 (two) times daily with a meal.       . Multiple Vitamins-Minerals (MULTIVITAMIN WITH MINERALS) tablet Take 1 tablet by mouth daily.      Marland Kitchen omeprazole (PRILOSEC OTC) 20 MG tablet Take 20 mg by mouth daily with breakfast.    . sertraline (ZOLOFT) 100 MG tablet Take 150 mg by mouth daily.     . vardenafil (LEVITRA) 20 MG tablet Take 20 mg by mouth daily as needed for erectile dysfunction.      No current facility-administered medications for this visit.    PHYSICAL EXAMINATION: ECOG PERFORMANCE STATUS: 1 - Symptomatic but completely ambulatory  Vitals:   03/24/19 1108  BP: (!) 153/88  Pulse: 93  Resp: 17  Temp: 98.3 F (36.8 C)  SpO2: 94%   Filed Weights   03/24/19 1108  Weight: 229 lb (103.9 kg)    LABORATORY DATA:  I have reviewed the data as listed CMP Latest Ref Rng & Units 03/18/2013 02/08/2008 11/24/2006  Glucose 70 - 99 mg/dL 130(H) 92 106(H)  BUN 6 - 23 mg/dL 14 8 10   Creatinine 0.4 - 1.5 mg/dL 1.1 0.92 0.92  Sodium 135 - 145 mEq/L 141 143 141  Potassium 3.5 - 5.1 mEq/L 3.9 4.1 4.8  Chloride 96 - 112 mEq/L 102 107 106  CO2 19 - 32 mEq/L 33(H) 31 29  Calcium 8.4 - 10.5 mg/dL 9.4 9.0 9.3    Lab Results  Component Value Date   WBC 6.3 03/22/2019   HGB 13.5 03/22/2019   HCT 43.0 03/22/2019   MCV 82.9 03/22/2019   PLT 220 03/22/2019   NEUTROABS 4.1 03/22/2019    ASSESSMENT & PLAN:  Iron deficiency anemia Lab review:  08/19/2018: Hemoglobin 11.7, MCV 74.3, RDW 18.2, rest of the CBC and platelet count normal 08/13/2017: Hemoglobin 14.8, MCV 92.4, WBC 9, platelets 245 12/20/2018: Hemoglobin 12.2, MCV 75, RDW 18.5, platelets 239, ferritin 6, iron saturation 6%, TIBC 421  IV iron: November 2020 (Venofer x4)  03/22/2019: Iron saturation 10%, ferritin 8, hemoglobin 13.5, MCV 82.9 After discussing the risks and benefits we decided to proceed with 4 more doses of IV iron. I expect that this will refill all of his iron stores.  Return to clinic in 3 months with labs done ahead of time and follow-up.  No  orders of the defined types were placed in this encounter.  The patient has a good understanding of the overall plan. he agrees with it. he will call with any problems that may develop before the next visit here.  Total time spent: 30 mins including face to face time and time spent for planning, charting and coordination of care  Nicholas Lose, MD 03/24/2019  I, Cloyde Reams Dorshimer, am acting as scribe for Dr. Nicholas Lose.  I have reviewed the above documentation for accuracy and completeness, and I agree with the above.

## 2019-03-24 ENCOUNTER — Other Ambulatory Visit: Payer: Self-pay

## 2019-03-24 ENCOUNTER — Inpatient Hospital Stay (HOSPITAL_BASED_OUTPATIENT_CLINIC_OR_DEPARTMENT_OTHER): Payer: Medicare Other | Admitting: Hematology and Oncology

## 2019-03-24 ENCOUNTER — Inpatient Hospital Stay: Payer: Medicare Other

## 2019-03-24 VITALS — BP 136/75 | HR 79 | Temp 98.1°F | Resp 18

## 2019-03-24 DIAGNOSIS — D509 Iron deficiency anemia, unspecified: Secondary | ICD-10-CM | POA: Diagnosis not present

## 2019-03-24 MED ORDER — SODIUM CHLORIDE 0.9 % IV SOLN
Freq: Once | INTRAVENOUS | Status: DC
  Filled 2019-03-24: qty 250

## 2019-03-24 MED ORDER — SODIUM CHLORIDE 0.9 % IV SOLN
200.0000 mg | Freq: Once | INTRAVENOUS | Status: AC
  Administered 2019-03-24: 200 mg via INTRAVENOUS
  Filled 2019-03-24: qty 200

## 2019-03-24 MED ORDER — ACETAMINOPHEN 325 MG PO TABS: 650.0000 mg | ORAL_TABLET | Freq: Once | ORAL | Status: DC

## 2019-03-24 NOTE — Progress Notes (Signed)
Pt refused to wait 30 minute post-Venofer for observation. VSS at discharge.

## 2019-03-24 NOTE — Patient Instructions (Signed)

## 2019-03-24 NOTE — Assessment & Plan Note (Signed)
Lab review:  08/19/2018: Hemoglobin 11.7, MCV 74.3, RDW 18.2, rest of the CBC and platelet count normal 08/13/2017: Hemoglobin 14.8, MCV 92.4, WBC 9, platelets 245 12/20/2018: Hemoglobin 12.2, MCV 75, RDW 18.5, platelets 239, ferritin 6, iron saturation 6%, TIBC 421  IV iron: November 2020 (Venofer x4)  03/22/2019: Iron saturation 10%, ferritin 8, hemoglobin 13.5, MCV 82.9 I suspect that the patient will need another round of IV iron treatment. We can watch and monitor for 3 months with labs done ahead of time.

## 2019-03-25 ENCOUNTER — Telehealth: Payer: Self-pay | Admitting: Hematology and Oncology

## 2019-03-25 DIAGNOSIS — Z20822 Contact with and (suspected) exposure to covid-19: Secondary | ICD-10-CM | POA: Diagnosis not present

## 2019-03-25 DIAGNOSIS — H2513 Age-related nuclear cataract, bilateral: Secondary | ICD-10-CM | POA: Diagnosis not present

## 2019-03-25 DIAGNOSIS — Z20828 Contact with and (suspected) exposure to other viral communicable diseases: Secondary | ICD-10-CM | POA: Diagnosis not present

## 2019-03-25 DIAGNOSIS — Z01818 Encounter for other preprocedural examination: Secondary | ICD-10-CM | POA: Diagnosis not present

## 2019-03-25 NOTE — Telephone Encounter (Signed)
I could not reach patient regarding schedule  °

## 2019-03-31 DIAGNOSIS — I10 Essential (primary) hypertension: Secondary | ICD-10-CM | POA: Diagnosis not present

## 2019-03-31 DIAGNOSIS — H25812 Combined forms of age-related cataract, left eye: Secondary | ICD-10-CM | POA: Diagnosis not present

## 2019-04-01 ENCOUNTER — Other Ambulatory Visit: Payer: Self-pay

## 2019-04-01 ENCOUNTER — Inpatient Hospital Stay: Payer: Medicare Other | Attending: Hematology and Oncology

## 2019-04-01 VITALS — BP 143/70 | HR 75 | Temp 98.8°F | Resp 20

## 2019-04-01 DIAGNOSIS — Z48810 Encounter for surgical aftercare following surgery on the sense organs: Secondary | ICD-10-CM | POA: Diagnosis not present

## 2019-04-01 DIAGNOSIS — H2511 Age-related nuclear cataract, right eye: Secondary | ICD-10-CM | POA: Diagnosis not present

## 2019-04-01 DIAGNOSIS — D509 Iron deficiency anemia, unspecified: Secondary | ICD-10-CM | POA: Diagnosis not present

## 2019-04-01 DIAGNOSIS — Z961 Presence of intraocular lens: Secondary | ICD-10-CM | POA: Diagnosis not present

## 2019-04-01 DIAGNOSIS — H401133 Primary open-angle glaucoma, bilateral, severe stage: Secondary | ICD-10-CM | POA: Diagnosis not present

## 2019-04-01 MED ORDER — ACETAMINOPHEN 325 MG PO TABS: 650.0000 mg | ORAL_TABLET | Freq: Once | ORAL | Status: DC

## 2019-04-01 MED ORDER — SODIUM CHLORIDE 0.9 % IV SOLN
Freq: Once | INTRAVENOUS | Status: AC
  Filled 2019-04-01: qty 250

## 2019-04-01 MED ORDER — SODIUM CHLORIDE 0.9 % IV SOLN
200.0000 mg | Freq: Once | INTRAVENOUS | Status: AC
  Administered 2019-04-01: 200 mg via INTRAVENOUS
  Filled 2019-04-01: qty 200

## 2019-04-01 NOTE — Patient Instructions (Signed)
Iron Sucrose injection What is this medicine? IRON SUCROSE (AHY ern SOO krohs) is an iron complex. Iron is used to make healthy red blood cells, which carry oxygen and nutrients throughout the body. This medicine is used to treat iron deficiency anemia in people with chronic kidney disease. This medicine may be used for other purposes; ask your health care provider or pharmacist if you have questions. COMMON BRAND NAME(S): Venofer What should I tell my health care provider before I take this medicine? They need to know if you have any of these conditions:  anemia not caused by low iron levels  heart disease  high levels of iron in the blood  kidney disease  liver disease  an unusual or allergic reaction to iron, other medicines, foods, dyes, or preservatives  pregnant or trying to get pregnant  breast-feeding How should I use this medicine? This medicine is for infusion into a vein. It is given by a health care professional in a hospital or clinic setting. Talk to your pediatrician regarding the use of this medicine in children. While this drug may be prescribed for children as young as 2 years for selected conditions, precautions do apply. Overdosage: If you think you have taken too much of this medicine contact a poison control center or emergency room at once. NOTE: This medicine is only for you. Do not share this medicine with others. What if I miss a dose? It is important not to miss your dose. Call your doctor or health care professional if you are unable to keep an appointment. What may interact with this medicine? Do not take this medicine with any of the following medications:  deferoxamine  dimercaprol  other iron products This medicine may also interact with the following medications:  chloramphenicol  deferasirox This list may not describe all possible interactions. Give your health care provider a list of all the medicines, herbs, non-prescription drugs, or  dietary supplements you use. Also tell them if you smoke, drink alcohol, or use illegal drugs. Some items may interact with your medicine. What should I watch for while using this medicine? Visit your doctor or healthcare professional regularly. Tell your doctor or healthcare professional if your symptoms do not start to get better or if they get worse. You may need blood work done while you are taking this medicine. You may need to follow a special diet. Talk to your doctor. Foods that contain iron include: whole grains/cereals, dried fruits, beans, or peas, leafy green vegetables, and organ meats (liver, kidney). What side effects may I notice from receiving this medicine? Side effects that you should report to your doctor or health care professional as soon as possible:  allergic reactions like skin rash, itching or hives, swelling of the face, lips, or tongue  breathing problems  changes in blood pressure  cough  fast, irregular heartbeat  feeling faint or lightheaded, falls  fever or chills  flushing, sweating, or hot feelings  joint or muscle aches/pains  seizures  swelling of the ankles or feet  unusually weak or tired Side effects that usually do not require medical attention (report to your doctor or health care professional if they continue or are bothersome):  diarrhea  feeling achy  headache  irritation at site where injected  nausea, vomiting  stomach upset  tiredness This list may not describe all possible side effects. Call your doctor for medical advice about side effects. You may report side effects to FDA at 1-800-FDA-1088. Where should I keep   my medicine? This drug is given in a hospital or clinic and will not be stored at home. NOTE: This sheet is a summary. It may not cover all possible information. If you have questions about this medicine, talk to your doctor, pharmacist, or health care provider.  2020 Elsevier/Gold Standard (2010-11-21  17:14:35) Coronavirus (COVID-19) Are you at risk?  Are you at risk for the Coronavirus (COVID-19)?  To be considered HIGH RISK for Coronavirus (COVID-19), you have to meet the following criteria:  . Traveled to [place], [place], [place], [place] or [place]; or in the [place] to [place], [place], [place], or [place]; and have fever, cough, and shortness of breath within the last 2 weeks of travel OR . Been in close contact with a person diagnosed with COVID-19 within the last 2 weeks and have fever, cough, and shortness of breath . IF YOU DO NOT MEET THESE CRITERIA, YOU ARE CONSIDERED LOW RISK FOR COVID-19.  What to do if you are HIGH RISK for COVID-19?  . If you are having a medical emergency, call 911. . Seek medical care right away. Before you go to a doctor's office, urgent care or emergency department, call ahead and tell them about your recent travel, contact with someone diagnosed with COVID-19, and your symptoms. You should receive instructions from your physician's office regarding next steps of care.  . When you arrive at healthcare provider, tell the healthcare staff immediately you have returned from visiting [place], [place], [place], [place] or [place]; or traveled in the [place] to [place], [place], [place], or [place]; in the last two weeks or you have been in close contact with a person diagnosed with COVID-19 in the last 2 weeks.   . Tell the health care staff about your symptoms: fever, cough and shortness of breath. . After you have been seen by a medical provider, you will be either: o Tested for (COVID-19) and discharged home on quarantine except to seek medical care if symptoms worsen, and asked to  - Stay home and avoid contact with others until you get your results (4-5 days)  - Avoid travel on public transportation if possible (such as bus, train, or airplane) or o Sent to the Emergency Department by EMS for evaluation, COVID-19 testing, and  possible admission depending on your condition and test results.  What to do if you are LOW RISK for COVID-19?  Reduce your risk of any infection by using the same precautions used for avoiding the common cold or flu:  . Wash your hands often with soap and warm water for at least 20 seconds.  If soap and water are not readily available, use an alcohol-based hand sanitizer with at least 60% alcohol.  . If coughing or sneezing, cover your mouth and nose by coughing or sneezing into the elbow areas of your shirt or coat, into a tissue or into your sleeve (not your hands). . Avoid shaking hands with others and consider head nods or verbal greetings only. . Avoid touching your eyes, nose, or mouth with unwashed hands.  . Avoid close contact with people who are sick. . Avoid places or events with large numbers of people in one location, like concerts or sporting events. . Carefully consider travel plans you have or are making. . If you are planning any travel outside or inside the [place], visit the CDC's Travelers' Health webpage for the latest health notices. . If you have some symptoms but not all   symptoms, continue to monitor at home and seek medical attention if your symptoms worsen. . If you are having a medical emergency, call 911.   ADDITIONAL HEALTHCARE OPTIONS FOR PATIENTS  [facility] / e-Visit: [email or web address]         [facility]: [phone]   Urgent Care: [phone]                   [facility]: [phone]  

## 2019-04-01 NOTE — Progress Notes (Signed)
Pt. refused to stay for 30 minute post observation after Iron infusion. Left via ambulation, no respiratory distress noted.

## 2019-04-06 ENCOUNTER — Inpatient Hospital Stay: Payer: Medicare Other

## 2019-04-06 ENCOUNTER — Other Ambulatory Visit: Payer: Self-pay

## 2019-04-06 VITALS — BP 132/66 | HR 76 | Temp 98.5°F | Resp 18

## 2019-04-06 DIAGNOSIS — D509 Iron deficiency anemia, unspecified: Secondary | ICD-10-CM

## 2019-04-06 MED ORDER — SODIUM CHLORIDE 0.9 % IV SOLN
Freq: Once | INTRAVENOUS | Status: AC
  Filled 2019-04-06: qty 250

## 2019-04-06 MED ORDER — SODIUM CHLORIDE 0.9 % IV SOLN
200.0000 mg | Freq: Once | INTRAVENOUS | Status: AC
  Administered 2019-04-06: 15:00:00 200 mg via INTRAVENOUS
  Filled 2019-04-06: qty 200

## 2019-04-06 NOTE — Progress Notes (Signed)
pt declined 30 minute post-observation after iron infusion. VSS at time of discharge

## 2019-04-06 NOTE — Patient Instructions (Signed)

## 2019-04-08 ENCOUNTER — Other Ambulatory Visit: Payer: Self-pay

## 2019-04-08 ENCOUNTER — Inpatient Hospital Stay: Payer: Medicare Other

## 2019-04-08 VITALS — BP 143/73 | HR 86 | Temp 98.4°F | Resp 18

## 2019-04-08 DIAGNOSIS — D509 Iron deficiency anemia, unspecified: Secondary | ICD-10-CM | POA: Diagnosis not present

## 2019-04-08 MED ORDER — ACETAMINOPHEN 325 MG PO TABS: 650.0000 mg | ORAL_TABLET | Freq: Once | ORAL | Status: DC

## 2019-04-08 MED ORDER — SODIUM CHLORIDE 0.9 % IV SOLN
Freq: Once | INTRAVENOUS | Status: AC
  Filled 2019-04-08: qty 250

## 2019-04-08 MED ORDER — SODIUM CHLORIDE 0.9 % IV SOLN
200.0000 mg | Freq: Once | INTRAVENOUS | Status: AC
  Administered 2019-04-08: 200 mg via INTRAVENOUS
  Filled 2019-04-08: qty 200

## 2019-04-08 NOTE — Progress Notes (Signed)
Patient declined to stay for 30 minute post observation period. VSS at completion of infusion. Patient given AVS.

## 2019-04-08 NOTE — Patient Instructions (Signed)

## 2019-04-16 DIAGNOSIS — Z20828 Contact with and (suspected) exposure to other viral communicable diseases: Secondary | ICD-10-CM | POA: Diagnosis not present

## 2019-04-16 DIAGNOSIS — Z20822 Contact with and (suspected) exposure to covid-19: Secondary | ICD-10-CM | POA: Diagnosis not present

## 2019-04-21 DIAGNOSIS — H25811 Combined forms of age-related cataract, right eye: Secondary | ICD-10-CM | POA: Diagnosis not present

## 2019-04-21 DIAGNOSIS — H2511 Age-related nuclear cataract, right eye: Secondary | ICD-10-CM | POA: Diagnosis not present

## 2019-04-21 DIAGNOSIS — I10 Essential (primary) hypertension: Secondary | ICD-10-CM | POA: Diagnosis not present

## 2019-05-23 DIAGNOSIS — H401133 Primary open-angle glaucoma, bilateral, severe stage: Secondary | ICD-10-CM | POA: Diagnosis not present

## 2019-05-23 DIAGNOSIS — Z961 Presence of intraocular lens: Secondary | ICD-10-CM | POA: Diagnosis not present

## 2019-06-01 ENCOUNTER — Other Ambulatory Visit: Payer: Self-pay

## 2019-06-01 ENCOUNTER — Ambulatory Visit (INDEPENDENT_AMBULATORY_CARE_PROVIDER_SITE_OTHER): Payer: Medicare Other | Admitting: Family Medicine

## 2019-06-01 ENCOUNTER — Encounter: Payer: Self-pay | Admitting: Family Medicine

## 2019-06-01 VITALS — BP 149/71 | HR 99 | Temp 97.0°F | Ht 72.0 in | Wt 230.0 lb

## 2019-06-01 DIAGNOSIS — R413 Other amnesia: Secondary | ICD-10-CM

## 2019-06-01 DIAGNOSIS — Z8669 Personal history of other diseases of the nervous system and sense organs: Secondary | ICD-10-CM | POA: Diagnosis not present

## 2019-06-01 DIAGNOSIS — F32A Depression, unspecified: Secondary | ICD-10-CM

## 2019-06-01 DIAGNOSIS — F329 Major depressive disorder, single episode, unspecified: Secondary | ICD-10-CM

## 2019-06-01 DIAGNOSIS — G4719 Other hypersomnia: Secondary | ICD-10-CM | POA: Diagnosis not present

## 2019-06-01 DIAGNOSIS — G20 Parkinson's disease: Secondary | ICD-10-CM | POA: Diagnosis not present

## 2019-06-01 MED ORDER — DONEPEZIL HCL 10 MG PO TABS: 10.0000 mg | ORAL_TABLET | Freq: Every day | ORAL | 3 refills | Status: DC

## 2019-06-01 NOTE — Progress Notes (Addendum)
PATIENT: James [name] DOB: [birth year]  REASON FOR VISIT: follow up HISTORY FROM: patient  Chief Complaint  Patient presents with  . Follow-up    back rm, PD, alone     HISTORY OF PRESENT ILLNESS: Today 06/01/19 James [name] is a 74 y.o. male here today for follow up for PD. He continues Sinemet IR 1.5 tablets TID and Sinemet CR 25-'100mg'$  tablets BID. He reports that he has felt better since adding Sinemet CR. He feels that tremor has improved. He feels gait is stable. He denies falls. He is not using his cane. He does not feel that he needs it. Memory is fairly stable. He continues Aricept '5mg'$  daily. He feels that it may be helping some and would like to increase dose. Depression is stable. He is taking Wellbutrin '300mg'$  and Zoloft '150mg'$  daily. PCP manages these medications. He is eating better. He has gained 3 pounds in 3 months.   He does endorse difficulty getting to sleep and staying asleep. He does snore. He is excessively tired throughout the day. He reports having a history of sleep apnea and was on CPAP therapy years ago. He is not sure who was managing this for him. He would like to have another sleep study.   HISTORY: (copied from  note on 03/03/2019)  James [name] is a 74 y.o. male here today for follow up of PD and memory loss.  James [name] reports that since last being seen tremors seem to have worsened.  He is concerned that the increased dose of Sinemet may have caused nausea and dizziness.  He does note improvement in tremors after taking Sinemet IR.  He does have a longstanding history of dizziness with position changes and is uncertain if dizziness noted since last being seen is related to Sinemet or not.  He did start Aricept 5 mg daily.  He is uncertain if this medication has helped at all.  He denies adverse effects.  He does continue to note difficulty with word recall.  Short-term memory has worsened.  He has had multiple falls.  His wife reports that he seems to  get tripped up as he shuffles his feet when he walks.  This is worse when he is tired.  He has started using a single prong cane.  This is helped with stability.  Although he does feel that his appetite has declined somewhat, he continues to eat well.  He has lost about 9 pounds since last being seen.  He denies any difficulty swallowing.  He does not drive.  He continues to perform ADLs without assistance.  HISTORY: (copied from my note on 11/29/2018)  James [name] a 74 y.o.malehere today for follow up for Parkinson's Disease. He continues Sinemet1.5tabletsTID. He does note more shaking. He does feel that he shakes a little less afterwards. Usual dosing is 10a, 4p and 10p. He has had more falls over the past year. Maybe 8-10 falls. Once he fell about 46f on his back. He denies injuries. His wife reports that his steps are getting shorter. He participated in PT (around 04/2015) but doesn't feel that it helped. He is more depressed. He is working with PCP and taking Zoloft '150mg'$  and Wellbutrin '300mg'$  daily. He does feel that memory is declining. He watched a movie with his wife recently but did not remember it the next day. He is doing well otherwise. He enjoys [place]. His wife is a mMicrobiologistin [employer]  HISTORY: (copied fromMegan [name]'snote on 11/26/2017)  James [name] is a 74 year old male with a history of Parkinson's disease. He returns today for follow-up. He is currently on Sinemet 1/2 tablet 3 times a day. He feels overall that his tremor may have gotten slightly worse. He notices the tremor primarily in the upper extremities and in the left leg. No change ingait he does report that his balance is off. He states that over the summer he was getting out of the pool and he did fall. Fortunately did not suffer any injuries. He does not use a cane or walker. Denies any trouble swallowing. Denies eating choked on food or liquids. He reports  that his sleep is fragmented. He states that he tends to wake up every hour during the night. For that reason he tends to nap a lot during the day. He returns today for evaluation.  HISTORY04/01/19 [name] a 74 year old male with a history of Parkinson's disease. He returns today for follow-up. He reports that he is doing fairly well. Continues on Sinemet 1-1/2 tablets 3 times a day. He reports that he has a tremor in the right hand that may have gotten slightly worse. In regards to his gait or balance he reports that he has had more episodes of tripping but no falls. He states that he is trying to continue doing the exercises he learned in physical therapy. Denies any changes with the bowels or bladder. Denies any changes withswallowing or chewing food. Reports trouble sleeping. Tends to go to bed around 4 AM and wake up at 8 AM and naps frequently throughout the day. He states that he tries to stay well-hydrated with water. Returns today for evaluation.   REVIEW OF SYSTEMS: Out of a complete 14 system review of symptoms, the patient complains only of the following symptoms, tremor, memory loss, depression, snoring, excessive daytime sleepiness and all other reviewed systems are negative.  ESS: 16 FSS: 52  ALLERGIES: Allergies  Allergen Reactions  . Codeine Itching  . Morphine Itching    HOME MEDICATIONS: Outpatient Medications Prior to Visit  Medication Sig Dispense Refill  . atorvastatin (LIPITOR) 20 MG tablet Take 20 mg by mouth at bedtime.     Marland Kitchen buPROPion (WELLBUTRIN XL) 300 MG 24 hr tablet Take 300 mg by mouth daily.     . calcium-vitamin D (OSCAL WITH D) 250-125 MG-UNIT per tablet Take 1 tablet by mouth daily.    . carbidopa-levodopa (SINEMET IR) 25-100 MG tablet Take 1.5 tablets by mouth 3 (three) times daily. 405 tablet 3  . Carbidopa-Levodopa ER (SINEMET CR) 25-100 MG tablet controlled release Take 1 tablet by mouth 2 (two) times daily. 180 tablet 3  .  cetirizine (ZYRTEC) 10 MG tablet Take 10 mg by mouth daily.    . ferrous gluconate (FERGON) 324 MG tablet Take 1 tablet (324 mg total) by mouth daily with breakfast. 90 tablet 3  . ferrous sulfate 325 (65 FE) MG tablet Take 325 mg by mouth 2 (two) times daily with a meal.     . Green Tea 315 MG CAPS Take 315 mg by mouth daily.    . hydrochlorothiazide (HYDRODIURIL) 25 MG tablet Take 25 mg by mouth daily.     Marland Kitchen MEGARED OMEGA-3 KRILL OIL PO Take 1 capsule by mouth daily.    . metFORMIN (GLUCOPHAGE) 500 MG tablet Take 1,000 mg by mouth 2 (two) times daily with a meal.     . Multiple Vitamins-Minerals (MULTIVITAMIN WITH MINERALS) tablet Take 1  tablet by mouth daily.      Marland Kitchen omeprazole (PRILOSEC OTC) 20 MG tablet Take 20 mg by mouth daily with breakfast.    . sertraline (ZOLOFT) 100 MG tablet Take 150 mg by mouth daily.     . vardenafil (LEVITRA) 20 MG tablet Take 20 mg by mouth daily as needed for erectile dysfunction.     Marland Kitchen donepezil (ARICEPT) 5 MG tablet Take 1 tablet (5 mg total) by mouth at bedtime. 90 tablet 3   No facility-administered medications prior to visit.    PAST MEDICAL HISTORY: Past Medical History:  Diagnosis Date  . Allergy    seasonal  . Blind left eye    legally  . Blood transfusion    2002  . COPD (chronic obstructive pulmonary disease) ([place])   . Depression   . Diabetes mellitus, type 2 ([place])   . Diverticulosis   . ED (erectile dysfunction)   . Glaucoma   . Hyperlipidemia   . Hypertension   . Insomnia   . Internal and external bleeding hemorrhoids   . Iron deficiency anemia   . OSA (obstructive sleep apnea)   . Osteoarthritis   . Rosacea   . Tremor of both hands   . Tubular adenoma of colon 03/2011    PAST SURGICAL HISTORY: Past Surgical History:  Procedure Laterality Date  . ARTHROSCOPIC REPAIR ACL    . CHOLECYSTECTOMY    . [place]   right  . COLONOSCOPY WITH PROPOFOL N/A 07/25/2015   Procedure: COLONOSCOPY WITH PROPOFOL;   Surgeon: [name], MD;  Location: [facility];  Service: Endoscopy;  Laterality: N/A;  . ESOPHAGOGASTRODUODENOSCOPY (EGD) WITH PROPOFOL N/A 07/25/2015   Procedure: ESOPHAGOGASTRODUODENOSCOPY (EGD) WITH PROPOFOL;  Surgeon: [name], MD;  Location: [facility];  Service: Endoscopy;  Laterality: N/A;  . KNEE ARTHROSCOPY  1996  . PARTIAL COLECTOMY  2008  . THYROID CYST EXCISION    . TONSILLECTOMY      FAMILY HISTORY: Family History  Problem Relation Age of Onset  . CAD Father   . Alcohol abuse Father   . Heart disease Father   . COPD Mother   . Asthma Mother   . Emphysema Mother   . Cancer Maternal Grandmother   . Colon cancer Neg Hx     SOCIAL HISTORY: Social History   Socioeconomic History  . Marital status: Married    Spouse name: Not on file  . Number of children: 1  . Years of education: HS  . Highest education level: Not on file  Occupational History  . Occupation: retired    Fish farm manager: RETIRED  Tobacco Use  . Smoking status: Current Some Day Smoker    Packs/day: 1.50    Years: 50.00    Pack years: 75.00    Types: Cigarettes    Last attempt to quit: 04/29/2013    Years since quitting: 6.0  . Smokeless tobacco: Never Used  . Tobacco comment: Stateshe sometimes sneaks a cigarette  Substance and Sexual Activity  . Alcohol use: Yes    Alcohol/week: 0.0 standard drinks    Comment: once yearly  . Drug use: No  . Sexual activity: Not on file  Other Topics Concern  . Not on file  Social History Narrative   Drinks 1-2 Pepsi a day    Social Determinants of Health   Financial Resource Strain:   . Difficulty of Paying Living Expenses:   Food Insecurity:   . Worried About Estate manager/land agent  of Food in the Last Year:   . [place] in the Last Year:   Transportation Needs:   . Lack of Transportation (Medical):   Marland Kitchen Lack of Transportation (Non-Medical):   Physical Activity:   . Days of Exercise per Week:   . Minutes of Exercise per Session:   Stress:    . Feeling of Stress :   Social Connections:   . Frequency of Communication with Friends and Family:   . Frequency of Social Gatherings with Friends and Family:   . Attends Religious Services:   . Active Member of Clubs or Organizations:   . Attends Archivist Meetings:   Marland Kitchen Marital Status:   Intimate Partner Violence:   . Fear of Current or Ex-Partner:   . Emotionally Abused:   Marland Kitchen Physically Abused:   . Sexually Abused:       PHYSICAL EXAM  Vitals:   06/01/19 1438  BP: (!) 149/71  Pulse: 99  Temp: (!) 97 F (36.1 C)  Weight: 230 lb (104.3 kg)  Height: 6' (1.829 m)   Body mass index is 31.19 kg/m.  Generalized: Well developed, in no acute distress  Cardiology: normal rate and rhythm, no murmur noted Respiratory: clear to auscultation bilaterally  Neck cir 18.75", Mallampati 3 Neurological examination  Mentation: Alert oriented to time, place, history taking. Follows all commands speech and language fluent Cranial nerve II-XII: Pupils were equal round reactive to light. Extraocular movements were full, visual field were full on confrontational test. Facial sensation and strength were normal. Uvula tongue midline. Head turning and shoulder shrug  were normal and symmetric. Motor: The motor testing reveals 5 over 5 strength of all 4 extremities. Good symmetric motor tone is noted throughout. Mild left hand tremor Sensory: Sensory testing is intact to soft touch on all 4 extremities. No evidence of extinction is noted.  Coordination: Cerebellar testing reveals good finger-nose-finger and heel-to-shin bilaterally.  Gait and station: Gait is short but stable without assistive device     DIAGNOSTIC DATA (LABS, IMAGING, TESTING) - I reviewed patient records, labs, notes, testing and imaging myself where available.  MMSE - Mini Mental State Exam 06/01/2019 03/03/2019 11/29/2018  Orientation to time '5 4 5  '$ Orientation to Place '5 5 5  '$ Registration '3 3 3  '$ Attention/  Calculation '5 4 4  '$ Recall '2 2 3  '$ Language- name 2 objects '2 2 2  '$ Language- repeat '1 1 1  '$ Language- follow 3 step command '3 3 3  '$ Language- read & follow direction '1 1 1  '$ Write a sentence '1 1 1  '$ Write a sentence-comments - - drew the clock 10 past 11  Copy design '1 1 1  '$ Copy design-comments 8 animals 12 animals named named 11 animals  Total score '29 27 29     '$ Lab Results  Component Value Date   WBC 6.3 03/22/2019   HGB 13.5 03/22/2019   HCT 43.0 03/22/2019   MCV 82.9 03/22/2019   PLT 220 03/22/2019      Component Value Date/Time   NA 141 03/18/2013 1422   K 3.9 03/18/2013 1422   CL 102 03/18/2013 1422   CO2 33 (H) 03/18/2013 1422   GLUCOSE 130 (H) 03/18/2013 1422   BUN 14 03/18/2013 1422   CREATININE 1.1 03/18/2013 1422   CALCIUM 9.4 03/18/2013 1422   GFRNONAA >60 02/08/2008 1431   GFRAA  02/08/2008 1431    >60        The eGFR has  been calculated using the MDRD equation. This calculation has not been validated in all clinical   No results found for: CHOL, HDL, LDLCALC, LDLDIRECT, TRIG, CHOLHDL Lab Results  Component Value Date   HGBA1C  11/24/2006    5.6 (NOTE)   The ADA recommends the following therapeutic goals for glycemic   control related to Hgb A1C measurement:   Goal of Therapy:   < 7.0% Hgb A1C   Action Suggested:  > 8.0% Hgb A1C   Ref:  Diabetes Care, 37, Suppl. 1, 1999   Lab Results  Component Value Date   [id number] 06/15/2012   No results found for: TSH   ASSESSMENT AND PLAN 74 y.o. year old male  has a past medical history of Allergy, Blind left eye, Blood transfusion, COPD (chronic obstructive pulmonary disease) ([place]), Depression, Diabetes mellitus, type 2 ([place]), Diverticulosis, ED (erectile dysfunction), Glaucoma, Hyperlipidemia, Hypertension, Insomnia, Internal and external bleeding hemorrhoids, Iron deficiency anemia, OSA (obstructive sleep apnea), Osteoarthritis, Rosacea, Tremor of both hands, and Tubular adenoma of colon (03/2011). here with      ICD-10-CM   1. Parkinson's disease ([place])  G20   2. Memory loss  R41.3 Ambulatory referral to Sleep Studies  3. Depression, unspecified depression type  F32.9   4. Excessive daytime sleepiness  G47.19 Ambulatory referral to Sleep Studies  5. History of sleep apnea  Z86.69     Mr. Streight reports that he is doing better, overall.  He is tolerating Sinemet IR and Sinemet CR well and without obvious adverse effects.  We will continue Sinemet IR 1.5 tablets 3 times daily and Sinemet CR twice daily.  We will increase Aricept to 10 mg daily as he does feel that it could be helping with his memory.  MMSE has improved.  He does endorse concerns of sleep apnea today.  We will refer him for consideration of a sleep study.  I have provided information on sleep hygiene.  He was encouraged to stay rigid, work on healthy lifestyle habits with well-balanced diet and regular exercise.  All precautions reviewed.  He will follow-up closely with primary care for blood pressure management as well as depression and anxiety management.  He feels that symptoms are stable today.  He will follow-up with me pending sleep evaluation.  He verbalizes understanding and agreement with this plan.   Orders Placed This Encounter  Procedures  . Ambulatory referral to Sleep Studies    Referral Priority:   Routine    Referral Type:   Consultation    Referral Reason:   Specialty Services Required    Number of Visits Requested:   1     Meds ordered this encounter  Medications  . donepezil (ARICEPT) 10 MG tablet    Sig: Take 1 tablet (10 mg total) by mouth at bedtime.    Dispense:  90 tablet    Refill:  3    Order Specific Question:   Supervising Provider    Answer:   Melvenia Beam [id number]      I spent 15 minutes with the patient. 50% of this time was spent counseling and educating patient on plan of care and medications.    Debbora Presto, FNP-C 06/01/2019, 4:30 PM [facility] [address], [place], [place] [address] [phone]  I reviewed the above note and documentation by the Nurse Practitioner and agree with the history, exam, assessment and plan as outlined above. I was available for consultation. Star Age, MD, PhD [facility])

## 2019-06-01 NOTE — Patient Instructions (Addendum)
We will continue Sinemet IR 1.5 tablets three times daily and Sinemet CR twice daily  We will increase Aricept to 10mg  daily   We will refer you to Dr [name] to discuss concerns of sleep apnea due to excessive fatigue, snoring and previous history of sleep apnea. Review sleep hygiene instructions given in office.   Stay well hydrated. Eat a healthy well balanced diet and get regular exercise. Be careful and avoid falls.   We will follow up pending sleep evaluation.    Sleep Apnea Sleep apnea affects breathing during sleep. It causes breathing to stop for a short time or to become shallow. It can also increase the risk of:  Heart attack.  Stroke.  Being very overweight (obese).  Diabetes.  Heart failure.  Irregular heartbeat. The goal of treatment is to help you breathe normally again. What are the causes? There are three kinds of sleep apnea:  Obstructive sleep apnea. This is caused by a blocked or collapsed airway.  Central sleep apnea. This happens when the brain does not send the right signals to the muscles that control breathing.  Mixed sleep apnea. This is a combination of obstructive and central sleep apnea. The most common cause of this condition is a collapsed or blocked airway. This can happen if:  Your throat muscles are too relaxed.  Your tongue and tonsils are too large.  You are overweight.  Your airway is too small. What increases the risk?  Being overweight.  Smoking.  Having a small airway.  Being older.  Being male.  Drinking alcohol.  Taking medicines to calm yourself (sedatives or tranquilizers).  Having family members with the condition. What are the signs or symptoms?  Trouble staying asleep.  Being sleepy or tired during the day.  Getting angry a lot.  Loud snoring.  Headaches in the morning.  Not being able to focus your mind (concentrate).  Forgetting things.  Less interest in sex.  Mood swings.  Personality  changes.  Feelings of sadness (depression).  Waking up a lot during the night to pee (urinate).  Dry mouth.  Sore throat. How is this diagnosed?  Your medical history.  A physical exam.  A test that is done when you are sleeping (sleep study). The test is most often done in a sleep lab but may also be done at home. How is this treated?   Sleeping on your side.  Using a medicine to get rid of mucus in your nose (decongestant).  Avoiding the use of alcohol, medicines to help you relax, or certain pain medicines (narcotics).  Losing weight, if needed.  Changing your diet.  Not smoking.  Using a machine to open your airway while you sleep, such as: ? An oral appliance. This is a mouthpiece that shifts your lower jaw forward. ? A CPAP device. This device blows air through a mask when you breathe out (exhale). ? An EPAP device. This has valves that you put in each nostril. ? A BPAP device. This device blows air through a mask when you breathe in (inhale) and breathe out.  Having surgery if other treatments do not work. It is important to get treatment for sleep apnea. Without treatment, it can lead to:  High blood pressure.  Coronary artery disease.  In men, not being able to have an erection (impotence).  Reduced thinking ability. Follow these instructions at home: Lifestyle  Make changes that your doctor recommends.  Eat a healthy diet.  Lose weight if needed.  Avoid alcohol, medicines to help you relax, and some pain medicines.  Do not use any products that contain nicotine or tobacco, such as cigarettes, e-cigarettes, and chewing tobacco. If you need help quitting, ask your doctor. General instructions  Take over-the-counter and prescription medicines only as told by your doctor.  If you were given a machine to use while you sleep, use it only as told by your doctor.  If you are having surgery, make sure to tell your doctor you have sleep apnea. You may  need to bring your device with you.  Keep all follow-up visits as told by your doctor. This is important. Contact a doctor if:  The machine that you were given to use during sleep bothers you or does not seem to be working.  You do not get better.  You get worse. Get help right away if:  Your chest hurts.  You have trouble breathing in enough air.  You have an uncomfortable feeling in your back, arms, or stomach.  You have trouble talking.  One side of your body feels weak.  A part of your face is hanging down. These symptoms may be an emergency. Do not wait to see if the symptoms will go away. Get medical help right away. Call your local emergency services (911 in the [place] Do not drive yourself to the hospital. Summary  This condition affects breathing during sleep.  The most common cause is a collapsed or blocked airway.  The goal of treatment is to help you breathe normally while you sleep. This information is not intended to replace advice given to you by your health care provider. Make sure you discuss any questions you have with your health care provider. Document Revised: 11/27/2017 Document Reviewed: 10/06/2017 Elsevier Patient Education  [employer].    Memory Compensation Strategies  4. Use "WARM" strategy.  W= write it down  A= associate it  R= repeat it  M= make a mental note  2.   You can keep a Social worker.  Use a 3-ring notebook with sections for the following: calendar, important names and phone numbers,  medications, doctors' names/phone numbers, lists/reminders, and a section to journal what you did  each day.   3.    Use a calendar to write appointments down.  4.    Write yourself a schedule for the day.  This can be placed on the calendar or in a separate section of the Memory Notebook.  Keeping a  regular schedule can help memory.  5.    Use medication organizer with sections for each day or morning/evening pills.  You may need  help loading it  6.    Keep a basket, or pegboard by the door.  Place items that you need to take out with you in the basket or on the pegboard.  You may also want to  include a message board for reminders.  7.    Use sticky notes.  Place sticky notes with reminders in a place where the task is performed.  For example: " turn off the  stove" placed by the stove, "lock the door" placed on the door at eye level, " take your medications" on  the bathroom mirror or by the place where you normally take your medications.  8.    Use alarms/timers.  Use while cooking to remind yourself to check on food or as a reminder to take your medicine, or as a  reminder to make a call, or  as a reminder to perform another task, etc.   Parkinson's Disease Parkinson's disease causes problems with movements. It is a long-term condition. It gets worse over time (is progressive). It affects each person in different ways. It makes it harder for you to:  Control how your body moves.  Move your body normally. The condition can range from mild to very bad (advanced). What are the causes? This condition results from a loss of brain cells called neurons. These brain cells make a chemical called dopamine, which is needed to control body movement. As the condition gets worse, the brain cells make less dopamine. This makes it hard to move or control your movements. The exact cause of this condition is not known. What increases the risk?  Being male.  Being age 74 or older.  Having family members who had Parkinson's disease.  Having had an injury to the brain.  Being very sad (depressed).  Being around things that are harmful or poisonous. What are the signs or symptoms? Symptoms of this condition can vary. The main symptoms have to do with movement. These include:  A tremor or shaking while you are resting that you cannot control.  Stiffness in your neck, arms, and legs.  Slowing of movement. This may  include: ? Losing expressions of the face. ? Having trouble making small movements that are needed to button your clothing or brush your teeth.  Walking in a way that is not normal. You may walk with short, shuffling steps.  Loss of balance when standing. You may sway, fall backward, or have trouble making turns. Other symptoms include:  Being very sad, worried, or confused.  Seeing or hearing things that are not real.  Losing thinking abilities (dementia).  Trouble speaking or swallowing.  Having a hard time pooping (constipation).  Needing to pee right away, peeing often, or not being able to control when you pee or poop.  Sleep problems. How is this treated? There is no cure. The goal of treatment is to manage your symptoms. Treatment may include:  Medicines.  Therapy to help with talking or movement.  Surgery to reduce shaking and other movements that you cannot control. Follow these instructions at home: Medicines  Take over-the-counter and prescription medicines only as told by your doctor.  Avoid taking pain or sleeping medicines. Eating and drinking  Follow instructions from your doctor about what you cannot eat or drink.  Do not drink alcohol. Activity  Talk with your doctor about if it is safe for you to drive.  Do exercises as told by your doctor. Lifestyle      Put in grab bars and railings in your home. These help to prevent falls.  Do not use any products that contain nicotine or tobacco, such as cigarettes, e-cigarettes, and chewing tobacco. If you need help quitting, ask your doctor.  Join a support group. General instructions  Talk with your doctor about what you need help with and what your safety needs are.  Keep all follow-up visits as told by your doctor, including any therapy visits to help with talking or moving. This is important. Contact a doctor if:  Medicines do not help your symptoms.  You feel off-balance.  You fall at  home.  You need more help at home.  You have trouble swallowing.  You have a very hard time pooping.  You have a lot of side effects from your medicines.  You feel very sad, worried, or confused. Get help right away  if:  You were hurt in a fall.  You see or hear things that are not real.  You cannot swallow without choking.  You have chest pain or trouble breathing.  You do not feel safe at home.  You have thoughts about hurting yourself or others. If you ever feel like you may hurt yourself or others, or have thoughts about taking your own life, get help right away. You can go to your nearest emergency department or call:  Your local emergency services (911 in the [place]  A suicide crisis helpline, such as the [facility] at 815-424-5084. This is open 24 hours a day. Summary  This condition causes problems with movements.  It is a long-term condition. It gets worse over time.  There is no cure. Treatment focuses on managing your symptoms.  Talk with your doctor about what you need help with and what your safety needs are.  Keep all follow-up visits as told by your doctor. This is important. This information is not intended to replace advice given to you by your health care provider. Make sure you discuss any questions you have with your health care provider. Document Revised: 04/29/2018 Document Reviewed: 04/29/2018 Elsevier Patient Education  [place].

## 2019-06-16 ENCOUNTER — Encounter: Payer: Self-pay | Admitting: Neurology

## 2019-06-16 ENCOUNTER — Telehealth: Payer: Self-pay

## 2019-06-16 ENCOUNTER — Ambulatory Visit (INDEPENDENT_AMBULATORY_CARE_PROVIDER_SITE_OTHER): Payer: Medicare Other | Admitting: Neurology

## 2019-06-16 ENCOUNTER — Telehealth: Payer: Self-pay | Admitting: Neurology

## 2019-06-16 VITALS — BP 152/72 | HR 75 | Temp 96.8°F | Ht 73.0 in | Wt 232.0 lb

## 2019-06-16 DIAGNOSIS — R296 Repeated falls: Secondary | ICD-10-CM | POA: Diagnosis not present

## 2019-06-16 DIAGNOSIS — G4733 Obstructive sleep apnea (adult) (pediatric): Secondary | ICD-10-CM | POA: Diagnosis not present

## 2019-06-16 DIAGNOSIS — G4719 Other hypersomnia: Secondary | ICD-10-CM | POA: Diagnosis not present

## 2019-06-16 DIAGNOSIS — Z9981 Dependence on supplemental oxygen: Secondary | ICD-10-CM | POA: Diagnosis not present

## 2019-06-16 DIAGNOSIS — Z8669 Personal history of other diseases of the nervous system and sense organs: Secondary | ICD-10-CM | POA: Diagnosis not present

## 2019-06-16 DIAGNOSIS — W19XXXD Unspecified fall, subsequent encounter: Secondary | ICD-10-CM

## 2019-06-16 DIAGNOSIS — G20A1 Parkinson's disease without dyskinesia, without mention of fluctuations: Secondary | ICD-10-CM

## 2019-06-16 DIAGNOSIS — G20 Parkinson's disease: Secondary | ICD-10-CM

## 2019-06-16 NOTE — Patient Instructions (Signed)
Thank you for choosing [facility] for your sleep related care! It was nice to meet you today! I appreciate that you entrust me with your sleep related healthcare concerns. I hope, I was able to address at least some of your concerns today, and that I can help you feel reassured and also get better.    Here is what we discussed today and what we came up with as our plan for you:    Based on your symptoms and your exam I believe you are still at risk for obstructive sleep apnea and would benefit from reevaluation as it has been many years. Therefore, I think we should proceed with a sleep study to determine how severe your sleep apnea is. If you have more than mild OSA, I want you to consider ongoing treatment with CPAP. Please remember, the risks and ramifications of moderate to severe obstructive sleep apnea or OSA are: Cardiovascular disease, including congestive heart failure, stroke, difficult to control hypertension, arrhythmias, and even type 2 diabetes has been linked to untreated OSA. Sleep apnea causes disruption of sleep and sleep deprivation in most cases, which, in turn, can cause recurrent headaches, problems with memory, mood, concentration, focus, and vigilance. Most people with untreated sleep apnea report excessive daytime sleepiness, which can affect their ability to drive. Please do not drive if you feel sleepy.   I will likely see you back after your sleep study to go over the test results and where to go from there. We will call you after your sleep study to advise about the results (most likely, you will hear from Firth, my nurse) and to set up an appointment at the time, as necessary.    Our sleep lab administrative assistant will call you to schedule your sleep study. If you don't hear back from her by about 2 weeks from now, please feel free to call her at [phone]. You can leave a message with your phone number and concerns, if you get the voicemail box.  She will call back as soon as possible.

## 2019-06-16 NOTE — Telephone Encounter (Signed)
[  2:25 PM] [facility])     hey can you tell Haik Mahoney DOB 10/01/1945 he can walk into [facility] anytime between 8 AM and 4 pm   I called pt's wife [name] ok per dpr) and advised of message from Raquel Sarna in regards to his CT scan ordered by Dr. [name] on 06/16/2019. She verbalized understanding and stated the pt would try and go tomorrow.
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
## 2019-06-16 NOTE — Telephone Encounter (Signed)
Medicare/mutual of [place] order sent to [facility] no auth they will reach out to the patient to schedule.

## 2019-06-17 ENCOUNTER — Ambulatory Visit
Admission: RE | Admit: 2019-06-17 | Discharge: 2019-06-17 | Disposition: A | Discharge status: Home / Self Care | Payer: Medicare Other | Source: Ambulatory Visit | Attending: Neurology | Admitting: Neurology

## 2019-06-17 DIAGNOSIS — G4719 Other hypersomnia: Secondary | ICD-10-CM

## 2019-06-17 DIAGNOSIS — G20 Parkinson's disease: Secondary | ICD-10-CM

## 2019-06-17 DIAGNOSIS — Z9981 Dependence on supplemental oxygen: Secondary | ICD-10-CM

## 2019-06-17 DIAGNOSIS — G20A1 Parkinson's disease without dyskinesia, without mention of fluctuations: Secondary | ICD-10-CM

## 2019-06-17 DIAGNOSIS — W19XXXD Unspecified fall, subsequent encounter: Secondary | ICD-10-CM

## 2019-06-17 DIAGNOSIS — G4733 Obstructive sleep apnea (adult) (pediatric): Secondary | ICD-10-CM

## 2019-06-17 DIAGNOSIS — Z8669 Personal history of other diseases of the nervous system and sense organs: Secondary | ICD-10-CM

## 2019-06-17 DIAGNOSIS — R296 Repeated falls: Secondary | ICD-10-CM

## 2019-06-17 DIAGNOSIS — S069X9A Unspecified intracranial injury with loss of consciousness of unspecified duration, initial encounter: Secondary | ICD-10-CM | POA: Diagnosis not present

## 2019-06-17 IMAGING — CT CT HEAD W/O CM
3 of 4 series · 15 of 47 positions shown, 18 images · non-contrast
Comparison: Head MRI [DATE]

CLINICAL DATA: Fell down stairs on concrete on [DATE]. Loss of
consciousness. Left orbital hematoma. Somnolence.

EXAM:
CT HEAD WITHOUT CONTRAST
TECHNIQUE: Contiguous axial images were obtained from the base of the skull
through the vertex without intravenous contrast.

[Series 2: head 5.00 hr40 s3 axial ibhc · axial · 0.40mm/px · z∈[-585,-450]mm · 9 of 33 slices shown, 12 images]
[im 3/33  brain]
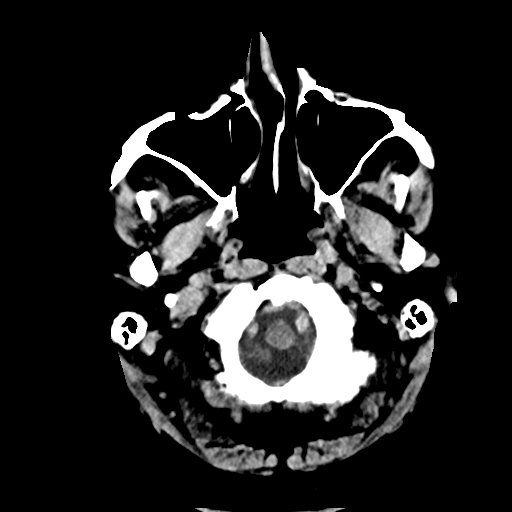
[im 3/33  bone]
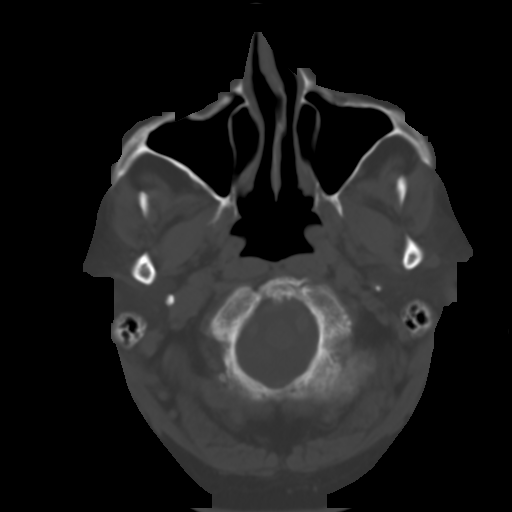
[im 7/33  brain]
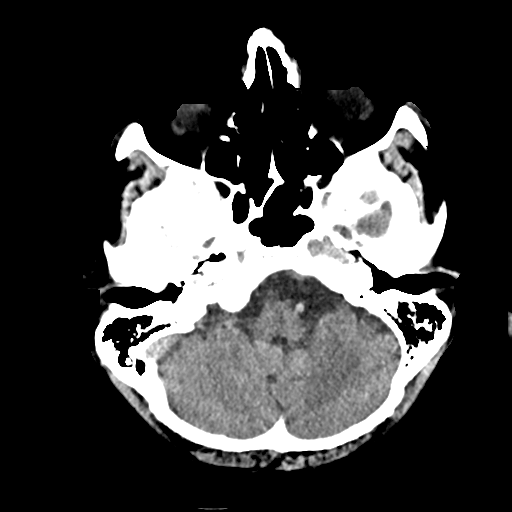
[im 10/33  brain]
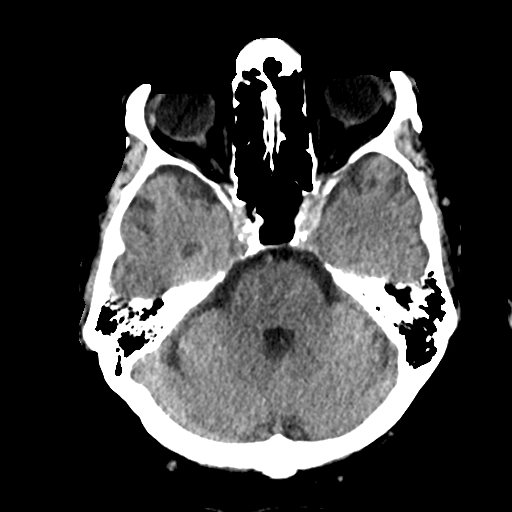
[im 14/33  brain]
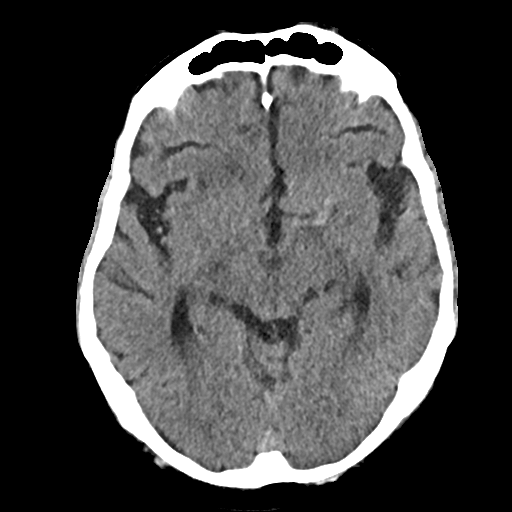
[im 17/33  brain]
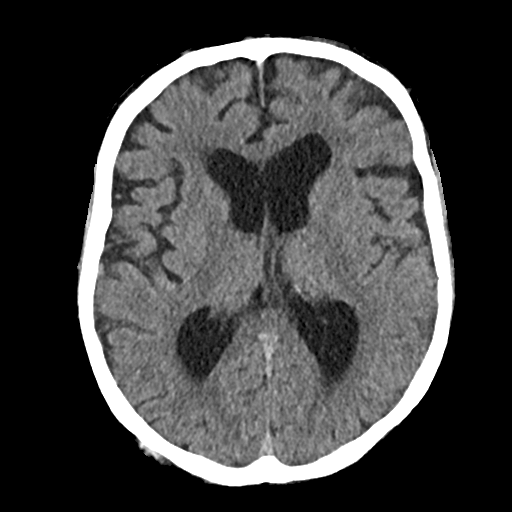
[im 17/33  bone]
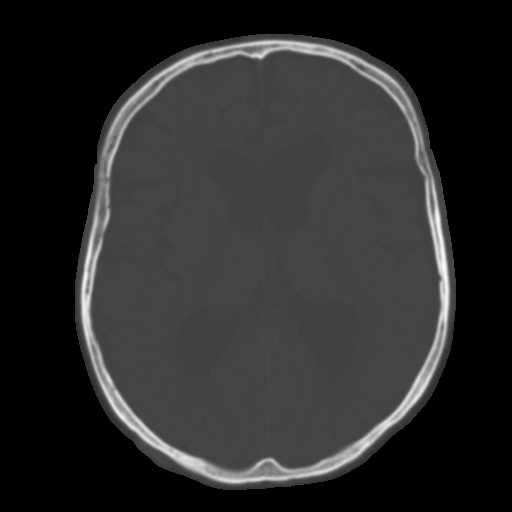
[im 19/33  brain]
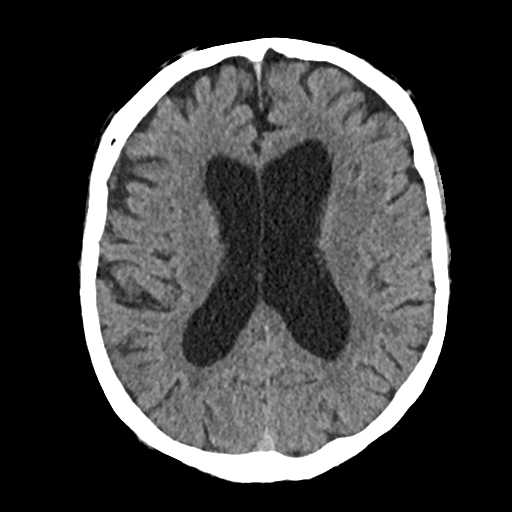
[im 23/33  brain]
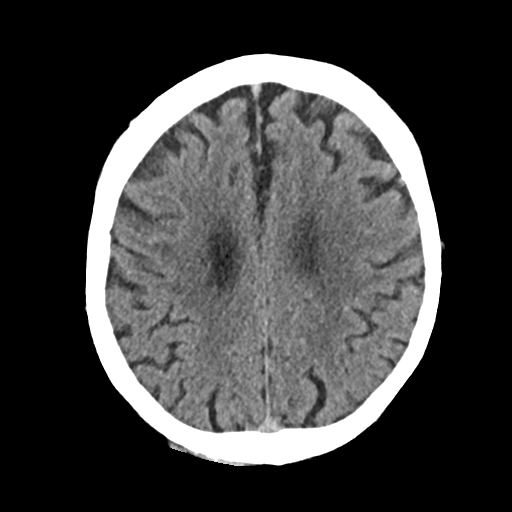
[im 26/33  brain]
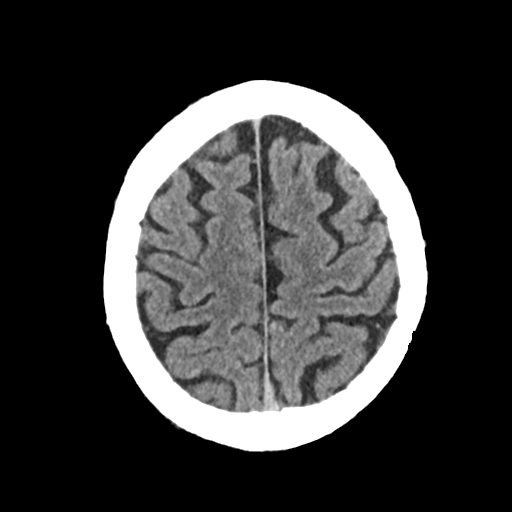
[im 30/33  brain]
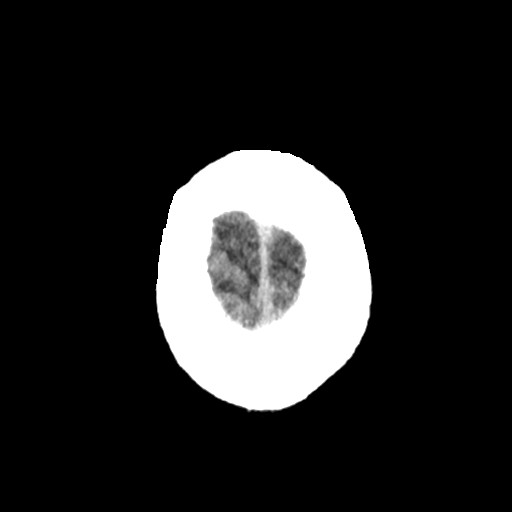
[im 30/33  bone]
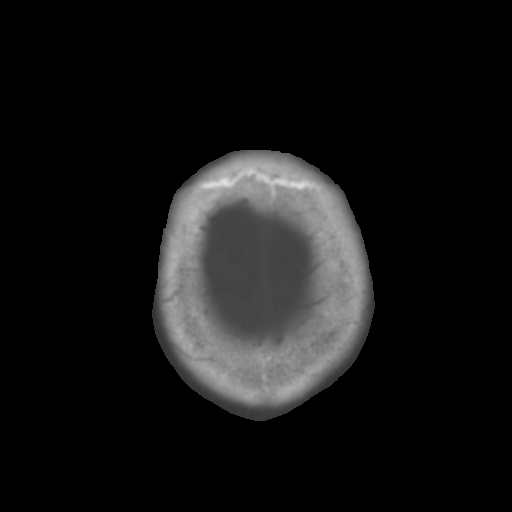

[Series 4: head 3.00 hr40 s3 sag · sagittal · 0.32mm/px · 3 of 59 slices shown]
[im 20/59  brain]
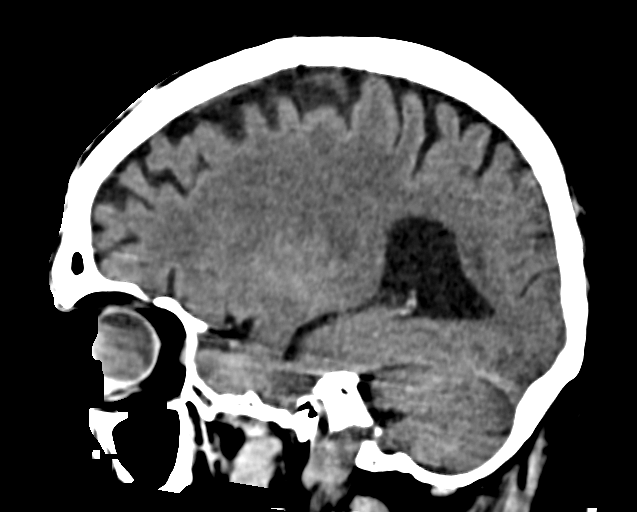
[im 30/59  brain]
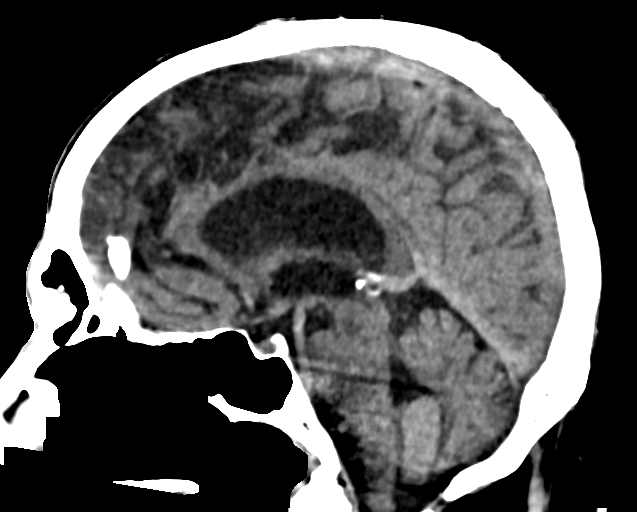
[im 39/59  brain]
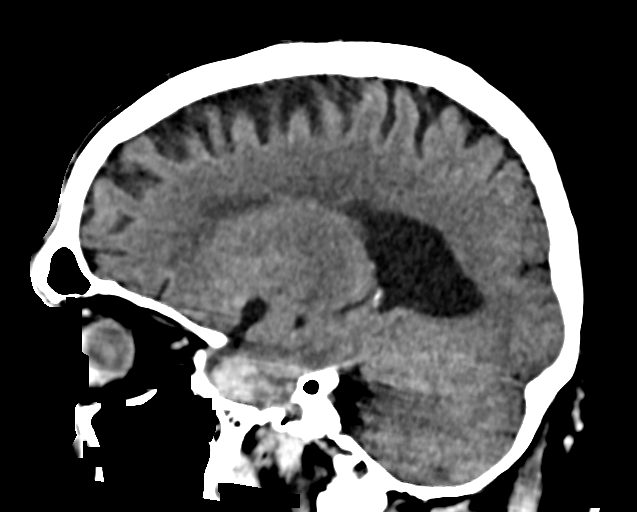

[Series 6: head 3.00 hr40 s3 cor · coronal · 0.32mm/px · 3 of 68 slices shown]
[im 23/68  brain]
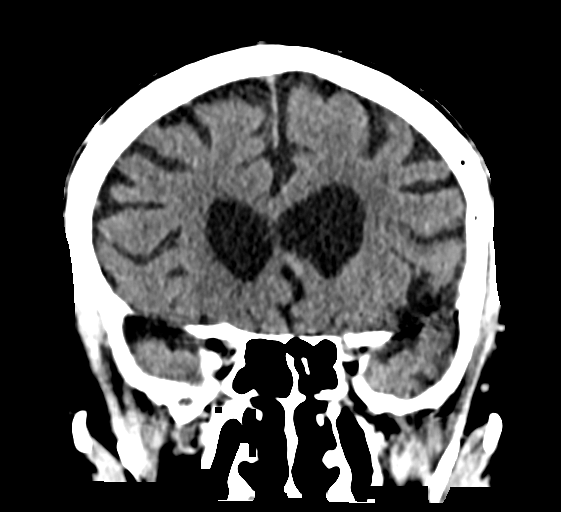
[im 30/68  brain]
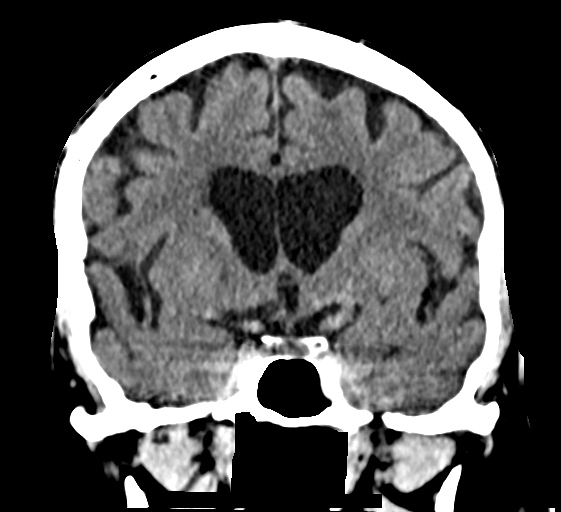
[im 38/68  brain]
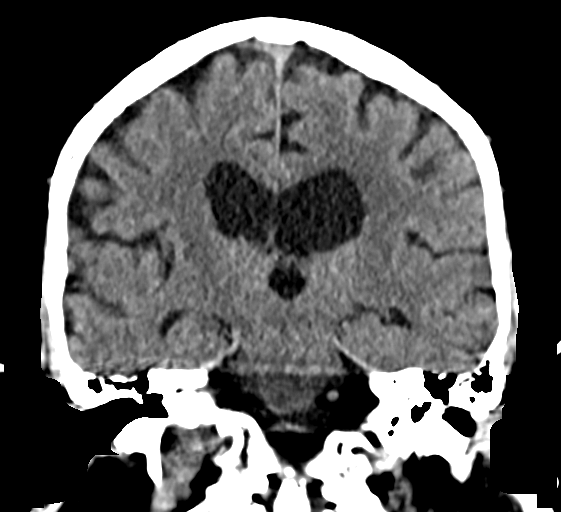

[15 of 47 positions shown; findings below may reference images not displayed]

FINDINGS: Brain: There is no evidence of acute infarct, intracranial
hemorrhage, mass, midline shift, or extra-axial fluid collection. A
small region of encephalomalacia in the left temporal lobe is
unchanged and may be related to remote trauma. Central predominant
cerebral atrophy is unchanged. Low level hypoattenuation in the
cerebral white matter is nonspecific but compatible with minimal
chronic small vessel ischemic disease.

Vascular: Calcified atherosclerosis at the skull base.

Skull: No fracture or suspicious osseous lesion.

Sinuses/Orbits: Visualized paranasal sinuses and mastoid air cells
are clear. Bilateral cataract extraction is noted.

Other: Small left supraorbital hematoma.
IMPRESSION: 1. No evidence of acute intracranial abnormality.
2. Small left supraorbital hematoma.

## 2019-06-20 ENCOUNTER — Telehealth: Payer: Self-pay

## 2019-06-20 ENCOUNTER — Other Ambulatory Visit: Payer: Self-pay

## 2019-06-20 ENCOUNTER — Inpatient Hospital Stay: Payer: Medicare Other | Attending: Hematology and Oncology

## 2019-06-20 DIAGNOSIS — D509 Iron deficiency anemia, unspecified: Secondary | ICD-10-CM | POA: Diagnosis not present

## 2019-06-20 DIAGNOSIS — W109XXA Fall (on) (from) unspecified stairs and steps, initial encounter: Secondary | ICD-10-CM | POA: Insufficient documentation

## 2019-06-20 LAB — CBC WITH DIFFERENTIAL (CANCER CENTER ONLY)
Abs Immature Granulocytes: 0.02 10*3/uL (ref 0.00–0.07)
Basophils Absolute: 0.1 10*3/uL (ref 0.0–0.1)
Basophils Relative: 1 %
Eosinophils Absolute: 0.1 10*3/uL (ref 0.0–0.5)
Eosinophils Relative: 2 %
HCT: 47.5 % (ref 39.0–52.0)
Hemoglobin: 15.4 g/dL (ref 13.0–17.0)
Immature Granulocytes: 0 %
Lymphocytes Relative: 20 %
Lymphs Abs: 1.2 10*3/uL (ref 0.7–4.0)
MCH: 30.3 pg (ref 26.0–34.0)
MCHC: 32.4 g/dL (ref 30.0–36.0)
MCV: 93.5 fL (ref 80.0–100.0)
Monocytes Absolute: 0.5 10*3/uL (ref 0.1–1.0)
Monocytes Relative: 9 %
Neutro Abs: 4 10*3/uL (ref 1.7–7.7)
Neutrophils Relative %: 68 %
Platelet Count: 215 10*3/uL (ref 150–400)
RBC: 5.08 MIL/uL (ref 4.22–5.81)
RDW: 15.8 % — ABNORMAL HIGH (ref 11.5–15.5)
WBC Count: 5.8 10*3/uL (ref 4.0–10.5)
nRBC: 0 % (ref 0.0–0.2)

## 2019-06-20 LAB — IRON AND TIBC
Iron: 81 ug/dL (ref 42–163)
Saturation Ratios: 21 % (ref 20–55)
TIBC: 386 ug/dL (ref 202–409)
UIBC: 305 ug/dL (ref 117–376)

## 2019-06-20 LAB — FERRITIN: Ferritin: 20 ng/mL — ABNORMAL LOW (ref 24–336)

## 2019-06-20 NOTE — Telephone Encounter (Signed)
I reached out to the pt's wife and advised of result. ( ok per dpr). She verbalized understanding of result.

## 2019-06-20 NOTE — Progress Notes (Signed)
Other than the swelling and the bruise around the left eyebrow, the head CT did not show any abnormality inside the skull such as hemorrhage or bony injury.  Please update patient or his wife.

## 2019-06-20 NOTE — Telephone Encounter (Signed)
-----   Message from Star Age, MD sent at 06/20/2019  7:16 AM EDT ----- Other than the swelling and the bruise around the left eyebrow, the head CT did not show any abnormality inside the skull such as hemorrhage or bony injury.  Please update patient or his wife.

## 2019-06-21 NOTE — Progress Notes (Signed)
Patient Care Team: [name], MD as PCP - General (Internal Medicine)  DIAGNOSIS:    ICD-10-CM   1. Iron deficiency anemia, unspecified iron deficiency anemia type  D50.9 Ferritin    Iron and TIBC    CBC with Differential (Cancer Center Only)    CHIEF COMPLIANT: Follow-up of iron deficiency anemia  INTERVAL HISTORY: James [name] is a 74 y.o. with above-mentioned history of iron deficiency anemia currently on oral iron and receiving IV iron (last 04/08/19). Labs on 06/20/19 showed: Hg 15.4, HCT 47.5, MCV 93.5, iron saturation 21%, ferritin 20. He presents to the clinic todayto review his labs.  He was at a wedding and fell on the steps and hurt his head.  He tells me that he had a CT scan which was negative.  ALLERGIES:  is allergic to codeine and morphine.  MEDICATIONS:  Current Outpatient Medications  Medication Sig Dispense Refill  . atorvastatin (LIPITOR) 20 MG tablet Take 20 mg by mouth at bedtime.     Marland Kitchen buPROPion (WELLBUTRIN XL) 300 MG 24 hr tablet Take 300 mg by mouth daily.     . calcium-vitamin D (OSCAL WITH D) 250-125 MG-UNIT per tablet Take 1 tablet by mouth daily.    . carbidopa-levodopa (SINEMET IR) 25-100 MG tablet Take 1.5 tablets by mouth 3 (three) times daily. 405 tablet 3  . Carbidopa-Levodopa ER (SINEMET CR) 25-100 MG tablet controlled release Take 1 tablet by mouth 2 (two) times daily. 180 tablet 3  . cetirizine (ZYRTEC) 10 MG tablet Take 10 mg by mouth daily.    Marland Kitchen donepezil (ARICEPT) 10 MG tablet Take 1 tablet (10 mg total) by mouth at bedtime. 90 tablet 3  . ferrous gluconate (FERGON) 324 MG tablet Take 1 tablet (324 mg total) by mouth daily with breakfast. 90 tablet 3  . ferrous sulfate 325 (65 FE) MG tablet Take 325 mg by mouth 2 (two) times daily with a meal.     . Green Tea 315 MG CAPS Take 315 mg by mouth daily.    . hydrochlorothiazide (HYDRODIURIL) 25 MG tablet Take 25 mg by mouth daily.     Marland Kitchen MEGARED OMEGA-3 KRILL OIL PO Take 1 capsule by mouth  daily.    . metFORMIN (GLUCOPHAGE) 500 MG tablet Take 1,000 mg by mouth 2 (two) times daily with a meal.     . Multiple Vitamins-Minerals (MULTIVITAMIN WITH MINERALS) tablet Take 1 tablet by mouth daily.      Marland Kitchen omeprazole (PRILOSEC OTC) 20 MG tablet Take 20 mg by mouth daily with breakfast.    . sertraline (ZOLOFT) 100 MG tablet Take 150 mg by mouth daily.     . vardenafil (LEVITRA) 20 MG tablet Take 20 mg by mouth daily as needed for erectile dysfunction.      No current facility-administered medications for this visit.    PHYSICAL EXAMINATION: ECOG PERFORMANCE STATUS: 1 - Symptomatic but completely ambulatory  Vitals:   06/22/19 1132  BP: 140/75  Pulse: 95  Resp: 18  Temp: 98.3 F (36.8 C)  SpO2: 91%   Filed Weights   06/22/19 1132  Weight: 230 lb 12.8 oz (104.7 kg)    LABORATORY DATA:  I have reviewed the data as listed CMP Latest Ref Rng & Units 03/18/2013 02/08/2008 11/24/2006  Glucose 70 - 99 mg/dL 130(H) 92 106(H)  BUN 6 - 23 mg/dL 14 8 10   Creatinine 0.4 - 1.5 mg/dL 1.1 0.92 0.92  Sodium 135 - 145 mEq/L 141 143 141  Potassium 3.5 - 5.1 mEq/L 3.9 4.1 4.8  Chloride 96 - 112 mEq/L 102 107 106  CO2 19 - 32 mEq/L 33(H) 31 29  Calcium 8.4 - 10.5 mg/dL 9.4 9.0 9.3    Lab Results  Component Value Date   WBC 5.8 06/20/2019   HGB 15.4 06/20/2019   HCT 47.5 06/20/2019   MCV 93.5 06/20/2019   PLT 215 06/20/2019   NEUTROABS 4.0 06/20/2019    ASSESSMENT & PLAN:  Iron deficiency anemia Lab review:  08/19/2018: Hemoglobin 11.7, MCV 74.3, RDW 18.2, rest of the CBC and platelet count normal 08/13/2017: Hemoglobin 14.8, MCV 92.4, WBC 9, platelets 245 12/20/2018: Hemoglobin 12.2, MCV 75, RDW 18.5, platelets 239, ferritin 6, iron saturation 6%, TIBC 421 06/20/2019: Hemoglobin 15.4, MCV 93.5, RDW 15.8, platelets 215, ferritin 20, iron saturation 21%, TIBC 386  IV iron: November 2020, Jan 2021 (Venofer x4)  Based on the above results there is no indication for IV iron therapy  at this time. Recheck iron studies and labs for follow-up in 4 months.  Labs to be done ahead of visit.     Orders Placed This Encounter  Procedures  . Ferritin    Standing Status:   Future    Standing Expiration Date:   06/21/2020  . Iron and TIBC    Standing Status:   Future    Standing Expiration Date:   06/21/2020  . CBC with Differential (Cancer Center Only)    Standing Status:   Future    Standing Expiration Date:   06/21/2020   The patient has a good understanding of the overall plan. he agrees with it. he will call with any problems that may develop before the next visit here.  Total time spent: 20 mins including face to face time and time spent for planning, charting and coordination of care  Nicholas Lose, MD 06/22/2019  I, Cloyde Reams Dorshimer, am acting as scribe for Dr. Nicholas Lose.  I have reviewed the above documentation for accuracy and completeness, and I agree with the above.

## 2019-06-22 ENCOUNTER — Other Ambulatory Visit: Payer: Self-pay

## 2019-06-22 ENCOUNTER — Inpatient Hospital Stay (HOSPITAL_BASED_OUTPATIENT_CLINIC_OR_DEPARTMENT_OTHER): Payer: Medicare Other | Admitting: Hematology and Oncology

## 2019-06-22 DIAGNOSIS — D509 Iron deficiency anemia, unspecified: Secondary | ICD-10-CM | POA: Diagnosis not present

## 2019-06-22 NOTE — Assessment & Plan Note (Signed)
Lab review:  08/19/2018: Hemoglobin 11.7, MCV 74.3, RDW 18.2, rest of the CBC and platelet count normal 08/13/2017: Hemoglobin 14.8, MCV 92.4, WBC 9, platelets 245 12/20/2018: Hemoglobin 12.2, MCV 75, RDW 18.5, platelets 239, ferritin 6, iron saturation 6%, TIBC 421 06/20/2019: Hemoglobin 15.4, MCV 93.5, RDW 15.8, platelets 215, ferritin 20, iron saturation 21%, TIBC 386  IV iron: November 2020, Jan 2021 (Venofer x4)  Based on the above results there is no indication for IV iron therapy at this time. Recheck iron studies and labs for follow-up in 3 months.  Labs to be done ahead of visit.

## 2019-06-23 ENCOUNTER — Telehealth: Payer: Self-pay

## 2019-06-23 NOTE — Telephone Encounter (Signed)
LM with wife to call me back to schedule sleep study for the patient

## 2019-07-13 ENCOUNTER — Ambulatory Visit (INDEPENDENT_AMBULATORY_CARE_PROVIDER_SITE_OTHER): Payer: Medicare Other | Admitting: Neurology

## 2019-07-13 ENCOUNTER — Other Ambulatory Visit: Payer: Self-pay

## 2019-07-13 DIAGNOSIS — G20 Parkinson's disease: Secondary | ICD-10-CM

## 2019-07-13 DIAGNOSIS — G4734 Idiopathic sleep related nonobstructive alveolar hypoventilation: Secondary | ICD-10-CM

## 2019-07-13 DIAGNOSIS — G20A1 Parkinson's disease without dyskinesia, without mention of fluctuations: Secondary | ICD-10-CM

## 2019-07-13 DIAGNOSIS — W19XXXD Unspecified fall, subsequent encounter: Secondary | ICD-10-CM

## 2019-07-13 DIAGNOSIS — G4733 Obstructive sleep apnea (adult) (pediatric): Secondary | ICD-10-CM

## 2019-07-13 DIAGNOSIS — G4720 Circadian rhythm sleep disorder, unspecified type: Secondary | ICD-10-CM

## 2019-07-13 DIAGNOSIS — R0683 Snoring: Secondary | ICD-10-CM

## 2019-07-13 DIAGNOSIS — R296 Repeated falls: Secondary | ICD-10-CM

## 2019-07-13 DIAGNOSIS — Z9981 Dependence on supplemental oxygen: Secondary | ICD-10-CM

## 2019-07-13 DIAGNOSIS — G4719 Other hypersomnia: Secondary | ICD-10-CM

## 2019-07-13 DIAGNOSIS — Z8669 Personal history of other diseases of the nervous system and sense organs: Secondary | ICD-10-CM

## 2019-07-21 NOTE — Progress Notes (Signed)
Patient had a PSG on 07/13/19 for re-eval of his prior Dx of OSA. Please call pt: He had no significant obstructive sleep apnea but had lower oxygen saturations throughout the night.  The study was started without supplemental oxygen but oxygen was added later.  He already is established on home oxygen at night.  Please advise him to continue with this and follow-up with his lung doctor as scheduled.  He can follow-up with Debbora Presto, NP, for his parkinsonism in the next 3 to 4 months.

## 2019-07-21 NOTE — Procedures (Signed)
PATIENT'S NAME:  James [name] DOB:      1946-01-28      MR#:    [id number]     DATE OF RECORDING: 07/13/2019 REFERRING M.D.:  [name] MD Study Performed:   Baseline Polysomnogram HISTORY: 74 year old man with a history of hyperlipidemia, depression, obesity, obstructive sleep apnea, hypertension, glaucoma, osteopenia, iron deficiency anemia, hypertension, former smoker with history of COPD/emphysema, chronic hypoxic respiratory failure, right lower lobe lung nodule, parkinsonism, and borderline obesity, who presents for evaluation of OSA. He was on CPAP therapy in the past. He is on nighttime oxygen supplementation. The patient endorsed the Epworth Sleepiness Scale at 14 points. The patient's weight 232 pounds with a height of 73 (inches), resulting in a BMI of 30.7 kg/m2. The patient's neck circumference measured 19 inches.  CURRENT MEDICATIONS: Lipitor, Wellbutrin, Sinemet, Zyrtec, Aricept, [place], 65FE, Hydrodiuril, Omega 3, Glucophage, Prilosec, Zoloft, Levitra,   PROCEDURE:  This is a multichannel digital polysomnogram utilizing the Somnostar 11.2 system.  Electrodes and sensors were applied and monitored per AASM Specifications.   EEG, EOG, Chin and Limb EMG, were sampled at 200 Hz.  ECG, Snore and Nasal Pressure, Thermal Airflow, Respiratory Effort, CPAP Flow and Pressure, Oximetry was sampled at 50 Hz. Digital video and audio were recorded.      BASELINE STUDY  Lights Out was at 22:19 and Lights On at 04:47.  Total recording time (TRT) was 389 minutes, with a total sleep time (TST) of 235.5 minutes.   The patient's sleep latency was 32 minutes, which is delayed. REM latency was 157.5 minutes, which is delayed. The sleep efficiency was 77.5%, which is reduced.     SLEEP ARCHITECTURE: WASO (Wake after sleep onset) was 136.5 minutes with one longer period of wakefulness and otherwise mild to moderate sleep fragmentation noted. There were 18 minutes in Stage N1, 195.5 minutes Stage N2, 18.5  minutes Stage N3 and 3.5 minutes in Stage REM.  The percentage of Stage N1 was 7.5%, Stage N2 was 82.4%, which is markedly increased, Stage N3 was 7.5% and Stage R (REM sleep) was 2.5%, which is markedly reduced. The arousals were noted as: 73 were spontaneous, 0 were associated with PLMs, 11 were associated with respiratory events.  RESPIRATORY ANALYSIS:  There were a total of 9 respiratory events:  5 obstructive apneas, 0 central apneas and 0 mixed apneas with a total of 5 apneas and an apnea index (AI) of 2.2 /hour. There were 4 hypopneas with a hypopnea index of 1.7 /hour. The patient also had 0 respiratory event related arousals (RERAs).      The total APNEA/HYPOPNEA INDEX (AHI) was 3.9/hour and the total RESPIRATORY DISTURBANCE INDEX was  3.9 /hour.  0 events occurred in REM sleep and 8 events in NREM. The REM AHI was  0 /hour, versus a non-REM AHI of 4.. The patient spent 94 minutes of total sleep time in the supine position and 142 minutes in non-supine.. The supine AHI was 8.1 versus a non-supine AHI of 0.0.  OXYGEN SATURATION & C02:  The Wake baseline 02 saturation was 88%, with the lowest being 83%. Time spent below 89% saturation equaled 114 minutes. The study was started without supplemental oxygen. He uses 3 lpm oxygen at night at home. He was started on 1 lpm O2 via [place] at 01:21, epoch 369, as he had consistent O2 saturations of 86-88%. After O2 was started, his O2 saturations were in the 89-90% range.  PERIODIC LIMB MOVEMENTS: The patient had a total  of 0 Periodic Limb Movements.  The Periodic Limb Movement (PLM) index was 0 and the PLM Arousal index was 0/hour.  Audio and video analysis did not show any abnormal or unusual movements, behaviors, phonations or vocalizations. The patient took 1 bathroom break. Mild to moderate snoring was noted. The EKG was in keeping with normal sinus rhythm (NSR).  Post-study, the patient indicated that sleep was the same as usual.    IMPRESSION:  1. Primary Snoring 2. Nocturnal hypoxemia, oxygen dependence 3. Dysfunctions associated with sleep stages or arousal from sleep  RECOMMENDATIONS:  1. This study does not demonstrate any significant obstructive or central sleep disordered breathing with the exception of snoring. The patient had low average oxygen saturations and improved with O2 supplementation. He had evidence of mild OSA in the supine sleep position. CPAP therapy is not warranted; he will be advised to continue with is nocturnal oxygen supplementation and follow up with his pulmonologist as scheduled.  2. This study shows sleep fragmentation and abnormal sleep stage percentages; these are nonspecific findings and per se do not signify an intrinsic sleep disorder or a cause for the patient's sleep-related symptoms. Causes include (but are not limited to) the first night effect of the sleep study, circadian rhythm disturbances, medication effect or an underlying mood disorder or medical problem.  3. The patient should be cautioned not to drive, work at heights, or operate dangerous or heavy equipment when tired or sleepy. Review and reiteration of good sleep hygiene measures should be pursued with any patient. 4. The patient will be seen in follow-up at [facility] as scheduled.   I certify that I have reviewed the entire raw data recording prior to the issuance of this report in accordance with the Standards of Accreditation of the American Academy of Sleep Medicine (AASM)   Star Age, MD, PhD Diplomat, American Board of Neurology and Sleep Medicine (Neurology and Sleep Medicine)

## 2019-07-27 ENCOUNTER — Telehealth: Payer: Self-pay

## 2019-07-27 NOTE — Telephone Encounter (Signed)
I contacted the pt's wife and advised of results. She verbalized understanding and will advise pt of results.

## 2019-07-27 NOTE — Telephone Encounter (Signed)
-----   Message from Star Age, MD sent at 07/21/2019  6:04 PM EDT ----- Patient had a PSG on 07/13/19 for re-eval of his prior Dx of OSA. Please call pt: He had no significant obstructive sleep apnea but had lower oxygen saturations throughout the night.  The study was started without supplemental oxygen but oxygen was added later.  He already is established on home oxygen at night.  Please advise him to continue with this and follow-up with his lung doctor as scheduled.  He can follow-up with Debbora Presto, NP, for his parkinsonism in the next 3 to 4 months.

## 2019-09-25 ENCOUNTER — Other Ambulatory Visit: Payer: Self-pay | Admitting: Hematology and Oncology

## 2019-10-12 ENCOUNTER — Inpatient Hospital Stay: Payer: Medicare Other | Attending: Hematology and Oncology

## 2019-10-12 DIAGNOSIS — D509 Iron deficiency anemia, unspecified: Secondary | ICD-10-CM | POA: Insufficient documentation

## 2019-10-13 NOTE — Progress Notes (Signed)
Patient Care Team: [name], MD as PCP - General (Internal Medicine)  DIAGNOSIS:    ICD-10-CM   1. Iron deficiency anemia, unspecified iron deficiency anemia type  D50.9 CBC with Differential (Cancer Center Only)    Ferritin    Iron and TIBC    CHIEF COMPLIANT: Follow-up of iron deficiency anemia  INTERVAL HISTORY: James [name] is a 74 y.o. with above-mentioned history of iron deficiency anemia currently on oral ironand receiving IV iron (last 04/08/19). He presents to the clinic todayto review his labs.  Overall he feels pretty good.  He does have mild fatigue with exertion.  ALLERGIES:  is allergic to codeine and morphine.  MEDICATIONS:  Current Outpatient Medications  Medication Sig Dispense Refill  . atorvastatin (LIPITOR) 20 MG tablet Take 20 mg by mouth at bedtime.     Marland Kitchen buPROPion (WELLBUTRIN XL) 300 MG 24 hr tablet Take 300 mg by mouth daily.     . calcium-vitamin D (OSCAL WITH D) 250-125 MG-UNIT per tablet Take 1 tablet by mouth daily.    . carbidopa-levodopa (SINEMET IR) 25-100 MG tablet Take 1.5 tablets by mouth 3 (three) times daily. 405 tablet 3  . Carbidopa-Levodopa ER (SINEMET CR) 25-100 MG tablet controlled release Take 1 tablet by mouth 2 (two) times daily. 180 tablet 3  . cetirizine (ZYRTEC) 10 MG tablet Take 10 mg by mouth daily.    Marland Kitchen donepezil (ARICEPT) 10 MG tablet Take 1 tablet (10 mg total) by mouth at bedtime. 90 tablet 3  . ferrous gluconate (FERGON) 324 MG tablet TAKE 1 TABLET BY MOUTH DAILY WITH BREAKFAST 90 tablet 3  . ferrous sulfate 325 (65 FE) MG tablet Take 325 mg by mouth 2 (two) times daily with a meal.     . Green Tea 315 MG CAPS Take 315 mg by mouth daily.    . hydrochlorothiazide (HYDRODIURIL) 25 MG tablet Take 25 mg by mouth daily.     Marland Kitchen MEGARED OMEGA-3 KRILL OIL PO Take 1 capsule by mouth daily.    . metFORMIN (GLUCOPHAGE) 500 MG tablet Take 1,000 mg by mouth 2 (two) times daily with a meal.     . Multiple Vitamins-Minerals  (MULTIVITAMIN WITH MINERALS) tablet Take 1 tablet by mouth daily.      Marland Kitchen omeprazole (PRILOSEC OTC) 20 MG tablet Take 20 mg by mouth daily with breakfast.    . sertraline (ZOLOFT) 100 MG tablet Take 150 mg by mouth daily.     . vardenafil (LEVITRA) 20 MG tablet Take 20 mg by mouth daily as needed for erectile dysfunction.      No current facility-administered medications for this visit.    PHYSICAL EXAMINATION: ECOG PERFORMANCE STATUS: 1 - Symptomatic but completely ambulatory  Vitals:   10/14/19 1104  BP: (!) 150/69  Pulse: 80  Resp: 18  Temp: 97.9 F (36.6 C)  SpO2: 96%   Filed Weights   10/14/19 1104  Weight: 230 lb 14.4 oz (104.7 kg)    LABORATORY DATA:  I have reviewed the data as listed CMP Latest Ref Rng & Units 03/18/2013 02/08/2008 11/24/2006  Glucose 70 - 99 mg/dL 130(H) 92 106(H)  BUN 6 - 23 mg/dL 14 8 10   Creatinine 0.40 - 1.50 mg/dL 1.1 0.92 0.92  Sodium 135 - 145 mEq/L 141 143 141  Potassium 3.5 - 5.1 mEq/L 3.9 4.1 4.8  Chloride 96 - 112 mEq/L 102 107 106  CO2 19 - 32 mEq/L 33(H) 31 29  Calcium 8.4 - 10.5 mg/dL  9.4 9.0 9.3    Lab Results  Component Value Date   WBC 6.9 10/14/2019   HGB 13.1 10/14/2019   HCT 41.7 10/14/2019   MCV 86.2 10/14/2019   PLT 278 10/14/2019   NEUTROABS 4.2 10/14/2019    ASSESSMENT & PLAN:  Iron deficiency anemia Lab review:  08/19/2018: Hemoglobin 11.7, MCV 74.3, RDW 18.2, rest of the CBC and platelet count normal 08/13/2017: Hemoglobin 14.8, MCV 92.4, WBC 9, platelets 245 12/20/2018: Hemoglobin 12.2, MCV 75, RDW 18.5, platelets 239, ferritin 6, iron saturation 6%, TIBC 421 06/20/2019: Hemoglobin 15.4, MCV 93.5, RDW 15.8, platelets 215, ferritin 20, iron saturation 21%, TIBC 386 10/14/2019: Hemoglobin 13.1, MCV 86.2, ferritin 6, iron saturation 7%  IV iron: November 2020, Jan 2021 (Venofer x4)  Based on profound iron deficiency, I recommended that he received IV iron therapy once again. I suspect that he will be needing iron  every 6 months.  Return to clinic in 6 months with labs done ahead of time and follow-up.   Orders Placed This Encounter  Procedures  . CBC with Differential (Cancer Center Only)    Standing Status:   Future    Standing Expiration Date:   10/13/2020  . Ferritin    Standing Status:   Future    Standing Expiration Date:   10/13/2020  . Iron and TIBC    Standing Status:   Future    Standing Expiration Date:   10/13/2020   The patient has a good understanding of the overall plan. he agrees with it. he will call with any problems that may develop before the next visit here.  Total time spent: 20 mins including face to face time and time spent for planning, charting and coordination of care  Nicholas Lose, MD 10/14/2019  I, Cloyde Reams Dorshimer, am acting as scribe for Dr. Nicholas Lose.  I have reviewed the above documentation for accuracy and completeness, and I agree with the above.

## 2019-10-14 ENCOUNTER — Inpatient Hospital Stay: Payer: Medicare Other

## 2019-10-14 ENCOUNTER — Other Ambulatory Visit: Payer: Self-pay

## 2019-10-14 ENCOUNTER — Inpatient Hospital Stay (HOSPITAL_BASED_OUTPATIENT_CLINIC_OR_DEPARTMENT_OTHER): Payer: Medicare Other | Admitting: Hematology and Oncology

## 2019-10-14 DIAGNOSIS — D509 Iron deficiency anemia, unspecified: Secondary | ICD-10-CM

## 2019-10-14 LAB — CBC WITH DIFFERENTIAL (CANCER CENTER ONLY)
Abs Immature Granulocytes: 0.02 10*3/uL (ref 0.00–0.07)
Basophils Absolute: 0.1 10*3/uL (ref 0.0–0.1)
Basophils Relative: 1 %
Eosinophils Absolute: 0.1 10*3/uL (ref 0.0–0.5)
Eosinophils Relative: 2 %
HCT: 41.7 % (ref 39.0–52.0)
Hemoglobin: 13.1 g/dL (ref 13.0–17.0)
Immature Granulocytes: 0 %
Lymphocytes Relative: 24 %
Lymphs Abs: 1.7 10*3/uL (ref 0.7–4.0)
MCH: 27.1 pg (ref 26.0–34.0)
MCHC: 31.4 g/dL (ref 30.0–36.0)
MCV: 86.2 fL (ref 80.0–100.0)
Monocytes Absolute: 0.8 10*3/uL (ref 0.1–1.0)
Monocytes Relative: 12 %
Neutro Abs: 4.2 10*3/uL (ref 1.7–7.7)
Neutrophils Relative %: 61 %
Platelet Count: 278 10*3/uL (ref 150–400)
RBC: 4.84 MIL/uL (ref 4.22–5.81)
RDW: 14.4 % (ref 11.5–15.5)
WBC Count: 6.9 10*3/uL (ref 4.0–10.5)
nRBC: 0 % (ref 0.0–0.2)

## 2019-10-14 LAB — IRON AND TIBC
Iron: 32 ug/dL — ABNORMAL LOW (ref 42–163)
Saturation Ratios: 7 % — ABNORMAL LOW (ref 20–55)
TIBC: 429 ug/dL — ABNORMAL HIGH (ref 202–409)
UIBC: 397 ug/dL — ABNORMAL HIGH (ref 117–376)

## 2019-10-14 LAB — FERRITIN: Ferritin: 6 ng/mL — ABNORMAL LOW (ref 24–336)

## 2019-10-14 NOTE — Assessment & Plan Note (Signed)
Lab review:  08/19/2018: Hemoglobin 11.7, MCV 74.3, RDW 18.2, rest of the CBC and platelet count normal 08/13/2017: Hemoglobin 14.8, MCV 92.4, WBC 9, platelets 245 12/20/2018: Hemoglobin 12.2, MCV 75, RDW 18.5, platelets 239, ferritin 6, iron saturation 6%, TIBC 421 06/20/2019: Hemoglobin 15.4, MCV 93.5, RDW 15.8, platelets 215, ferritin 20, iron saturation 21%, TIBC 386 10/14/2019:  IV iron: November 2020, Jan 2021 (Venofer x4)

## 2019-10-18 ENCOUNTER — Telehealth: Payer: Self-pay | Admitting: Hematology and Oncology

## 2019-10-18 NOTE — Telephone Encounter (Signed)
Scheduled appts per 8/20 los. Called both numbers on file. Left a voicemail with appt dates and times.

## 2019-10-21 ENCOUNTER — Other Ambulatory Visit: Payer: Self-pay

## 2019-10-21 ENCOUNTER — Inpatient Hospital Stay: Payer: Medicare Other

## 2019-10-21 VITALS — BP 146/66 | HR 69 | Temp 98.4°F | Resp 20

## 2019-10-21 DIAGNOSIS — D509 Iron deficiency anemia, unspecified: Secondary | ICD-10-CM

## 2019-10-21 MED ORDER — SODIUM CHLORIDE 0.9 % IV SOLN
Freq: Once | INTRAVENOUS | Status: AC
  Filled 2019-10-21: qty 250

## 2019-10-21 MED ORDER — SODIUM CHLORIDE 0.9 % IV SOLN
300.0000 mg | Freq: Once | INTRAVENOUS | Status: AC
  Administered 2019-10-21: 300 mg via INTRAVENOUS
  Filled 2019-10-21: qty 10

## 2019-10-21 NOTE — Patient Instructions (Signed)

## 2019-10-25 ENCOUNTER — Other Ambulatory Visit: Payer: Self-pay

## 2019-10-25 ENCOUNTER — Inpatient Hospital Stay: Payer: Medicare Other

## 2019-10-25 VITALS — BP 135/74 | HR 74 | Temp 98.3°F | Resp 16

## 2019-10-25 DIAGNOSIS — D509 Iron deficiency anemia, unspecified: Secondary | ICD-10-CM

## 2019-10-25 MED ORDER — SODIUM CHLORIDE 0.9 % IV SOLN
300.0000 mg | Freq: Once | INTRAVENOUS | Status: AC
  Administered 2019-10-25: 300 mg via INTRAVENOUS
  Filled 2019-10-25: qty 10

## 2019-10-25 MED ORDER — SODIUM CHLORIDE 0.9 % IV SOLN
Freq: Once | INTRAVENOUS | Status: AC
  Filled 2019-10-25: qty 250

## 2019-10-25 NOTE — Progress Notes (Signed)
Pt. states he does not wait 30 minute post observation, states he tolerates iron without difficulty. Denies complaints. Left via ambulation, no shortness of breath noted.

## 2019-10-25 NOTE — Patient Instructions (Signed)

## 2019-10-28 ENCOUNTER — Inpatient Hospital Stay: Payer: Medicare Other | Attending: Hematology and Oncology

## 2019-10-28 ENCOUNTER — Other Ambulatory Visit: Payer: Self-pay

## 2019-10-28 VITALS — BP 139/80 | HR 74 | Temp 98.2°F | Resp 18

## 2019-10-28 DIAGNOSIS — D509 Iron deficiency anemia, unspecified: Secondary | ICD-10-CM | POA: Diagnosis not present

## 2019-10-28 MED ORDER — SODIUM CHLORIDE 0.9 % IV SOLN
Freq: Once | INTRAVENOUS | Status: AC
  Filled 2019-10-28: qty 250

## 2019-10-28 MED ORDER — SODIUM CHLORIDE 0.9 % IV SOLN
300.0000 mg | Freq: Once | INTRAVENOUS | Status: AC
  Administered 2019-10-28: 300 mg via INTRAVENOUS
  Filled 2019-10-28: qty 5

## 2019-10-28 NOTE — Patient Instructions (Signed)

## 2019-12-01 DIAGNOSIS — E119 Type 2 diabetes mellitus without complications: Secondary | ICD-10-CM | POA: Diagnosis not present

## 2019-12-01 DIAGNOSIS — E7800 Pure hypercholesterolemia, unspecified: Secondary | ICD-10-CM | POA: Diagnosis not present

## 2019-12-01 DIAGNOSIS — I10 Essential (primary) hypertension: Secondary | ICD-10-CM | POA: Diagnosis not present

## 2019-12-01 DIAGNOSIS — N390 Urinary tract infection, site not specified: Secondary | ICD-10-CM | POA: Diagnosis not present

## 2019-12-01 DIAGNOSIS — R319 Hematuria, unspecified: Secondary | ICD-10-CM | POA: Diagnosis not present

## 2019-12-01 DIAGNOSIS — Z125 Encounter for screening for malignant neoplasm of prostate: Secondary | ICD-10-CM | POA: Diagnosis not present

## 2019-12-06 DIAGNOSIS — G20 Parkinson's disease: Secondary | ICD-10-CM | POA: Diagnosis not present

## 2019-12-06 DIAGNOSIS — L820 Inflamed seborrheic keratosis: Secondary | ICD-10-CM | POA: Diagnosis not present

## 2019-12-06 DIAGNOSIS — J439 Emphysema, unspecified: Secondary | ICD-10-CM | POA: Diagnosis not present

## 2019-12-06 DIAGNOSIS — I10 Essential (primary) hypertension: Secondary | ICD-10-CM | POA: Diagnosis not present

## 2019-12-06 DIAGNOSIS — G4700 Insomnia, unspecified: Secondary | ICD-10-CM | POA: Diagnosis not present

## 2019-12-06 DIAGNOSIS — M8580 Other specified disorders of bone density and structure, unspecified site: Secondary | ICD-10-CM | POA: Diagnosis not present

## 2019-12-06 DIAGNOSIS — J449 Chronic obstructive pulmonary disease, unspecified: Secondary | ICD-10-CM | POA: Diagnosis not present

## 2019-12-06 DIAGNOSIS — Z0000 Encounter for general adult medical examination without abnormal findings: Secondary | ICD-10-CM | POA: Diagnosis not present

## 2019-12-06 DIAGNOSIS — Z23 Encounter for immunization: Secondary | ICD-10-CM | POA: Diagnosis not present

## 2019-12-06 DIAGNOSIS — E7800 Pure hypercholesterolemia, unspecified: Secondary | ICD-10-CM | POA: Diagnosis not present

## 2019-12-06 DIAGNOSIS — D500 Iron deficiency anemia secondary to blood loss (chronic): Secondary | ICD-10-CM | POA: Diagnosis not present

## 2019-12-06 DIAGNOSIS — Z9981 Dependence on supplemental oxygen: Secondary | ICD-10-CM | POA: Diagnosis not present

## 2019-12-15 DIAGNOSIS — Z23 Encounter for immunization: Secondary | ICD-10-CM | POA: Diagnosis not present

## 2019-12-20 DIAGNOSIS — H401113 Primary open-angle glaucoma, right eye, severe stage: Secondary | ICD-10-CM | POA: Diagnosis not present

## 2019-12-20 DIAGNOSIS — Z961 Presence of intraocular lens: Secondary | ICD-10-CM | POA: Diagnosis not present

## 2019-12-20 DIAGNOSIS — H401123 Primary open-angle glaucoma, left eye, severe stage: Secondary | ICD-10-CM | POA: Diagnosis not present

## 2019-12-20 DIAGNOSIS — E119 Type 2 diabetes mellitus without complications: Secondary | ICD-10-CM | POA: Diagnosis not present

## 2019-12-22 DIAGNOSIS — L820 Inflamed seborrheic keratosis: Secondary | ICD-10-CM | POA: Diagnosis not present

## 2019-12-22 DIAGNOSIS — B078 Other viral warts: Secondary | ICD-10-CM | POA: Diagnosis not present

## 2019-12-22 DIAGNOSIS — L308 Other specified dermatitis: Secondary | ICD-10-CM | POA: Diagnosis not present

## 2020-01-17 DIAGNOSIS — N529 Male erectile dysfunction, unspecified: Secondary | ICD-10-CM | POA: Diagnosis not present

## 2020-01-17 DIAGNOSIS — F339 Major depressive disorder, recurrent, unspecified: Secondary | ICD-10-CM | POA: Diagnosis not present

## 2020-01-24 DIAGNOSIS — Z961 Presence of intraocular lens: Secondary | ICD-10-CM | POA: Diagnosis not present

## 2020-01-24 DIAGNOSIS — E119 Type 2 diabetes mellitus without complications: Secondary | ICD-10-CM | POA: Diagnosis not present

## 2020-01-24 DIAGNOSIS — H401133 Primary open-angle glaucoma, bilateral, severe stage: Secondary | ICD-10-CM | POA: Diagnosis not present

## 2020-01-24 DIAGNOSIS — H53433 Sector or arcuate defects, bilateral: Secondary | ICD-10-CM | POA: Diagnosis not present

## 2020-04-04 ENCOUNTER — Telehealth: Payer: Self-pay | Admitting: Pulmonary Disease

## 2020-04-04 NOTE — Telephone Encounter (Signed)
Spoke with patient's wife. She stated that he has been experiencing increased SOB for the past 4 days. He is currently on 3L. He was prescribed the O2 at night but has been using it more during the day recently. He noticed a small amount of blood while coughing this morning. He is fully vaccinated but has not tested for covid. She does have at home covid tests and will test him tonight. She is to call our office ASAP if the results are positive. She was calling to get him scheduled for an appt.   I advised her that since it had been awhile since we had seen the patient and based on his symptoms, he needs to go to the ED. She stated that she wanted to start with an OV first. I expressed to her that the ED would be the best place for him based on his symptoms but she still refused. Advised her again that if he gets any worse, to please call 911. She verbalized understanding. He has been scheduled to see SG on 2/14.   Nothing further needed at time of call.

## 2020-04-09 ENCOUNTER — Ambulatory Visit (INDEPENDENT_AMBULATORY_CARE_PROVIDER_SITE_OTHER): Payer: Medicare Other

## 2020-04-09 ENCOUNTER — Encounter: Payer: Self-pay | Admitting: Acute Care

## 2020-04-09 ENCOUNTER — Other Ambulatory Visit: Payer: Self-pay

## 2020-04-09 ENCOUNTER — Ambulatory Visit (INDEPENDENT_AMBULATORY_CARE_PROVIDER_SITE_OTHER): Payer: Medicare Other | Admitting: Acute Care

## 2020-04-09 VITALS — BP 124/64 | HR 74 | Temp 97.3°F | Ht 73.0 in | Wt 236.6 lb

## 2020-04-09 DIAGNOSIS — J439 Emphysema, unspecified: Secondary | ICD-10-CM | POA: Diagnosis not present

## 2020-04-09 DIAGNOSIS — J9611 Chronic respiratory failure with hypoxia: Secondary | ICD-10-CM

## 2020-04-09 DIAGNOSIS — R0602 Shortness of breath: Secondary | ICD-10-CM | POA: Diagnosis not present

## 2020-04-09 DIAGNOSIS — Z720 Tobacco use: Secondary | ICD-10-CM

## 2020-04-09 DIAGNOSIS — J441 Chronic obstructive pulmonary disease with (acute) exacerbation: Secondary | ICD-10-CM | POA: Diagnosis not present

## 2020-04-09 DIAGNOSIS — R918 Other nonspecific abnormal finding of lung field: Secondary | ICD-10-CM | POA: Diagnosis not present

## 2020-04-09 IMAGING — DX DG CHEST 2V
2 series · 2 of 2 positions shown · non-contrast
Comparison: Chest radiograph [DATE]; chest CT [DATE]

CLINICAL DATA: Shortness of breath

EXAM:
CHEST - 2 VIEW

[chest pa]
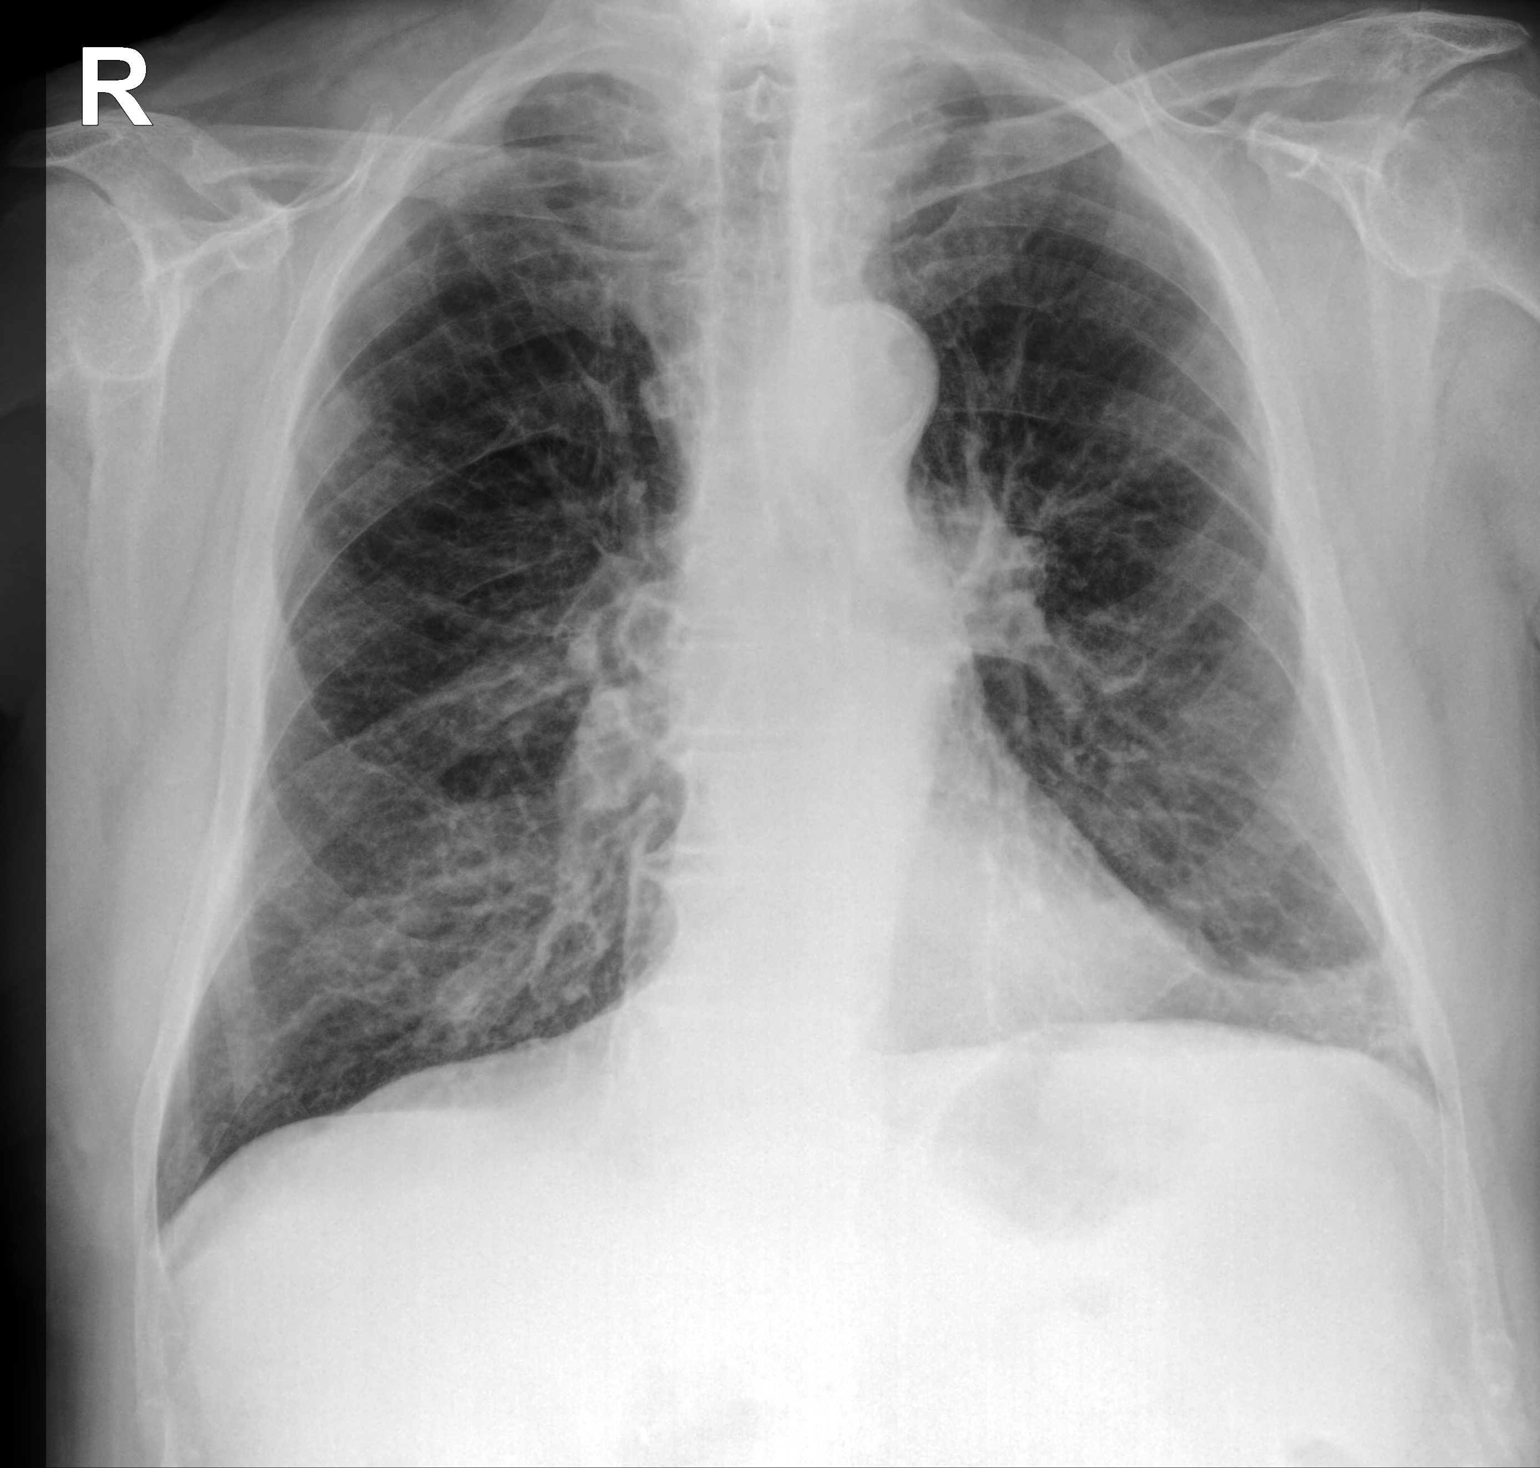

[chest lat]
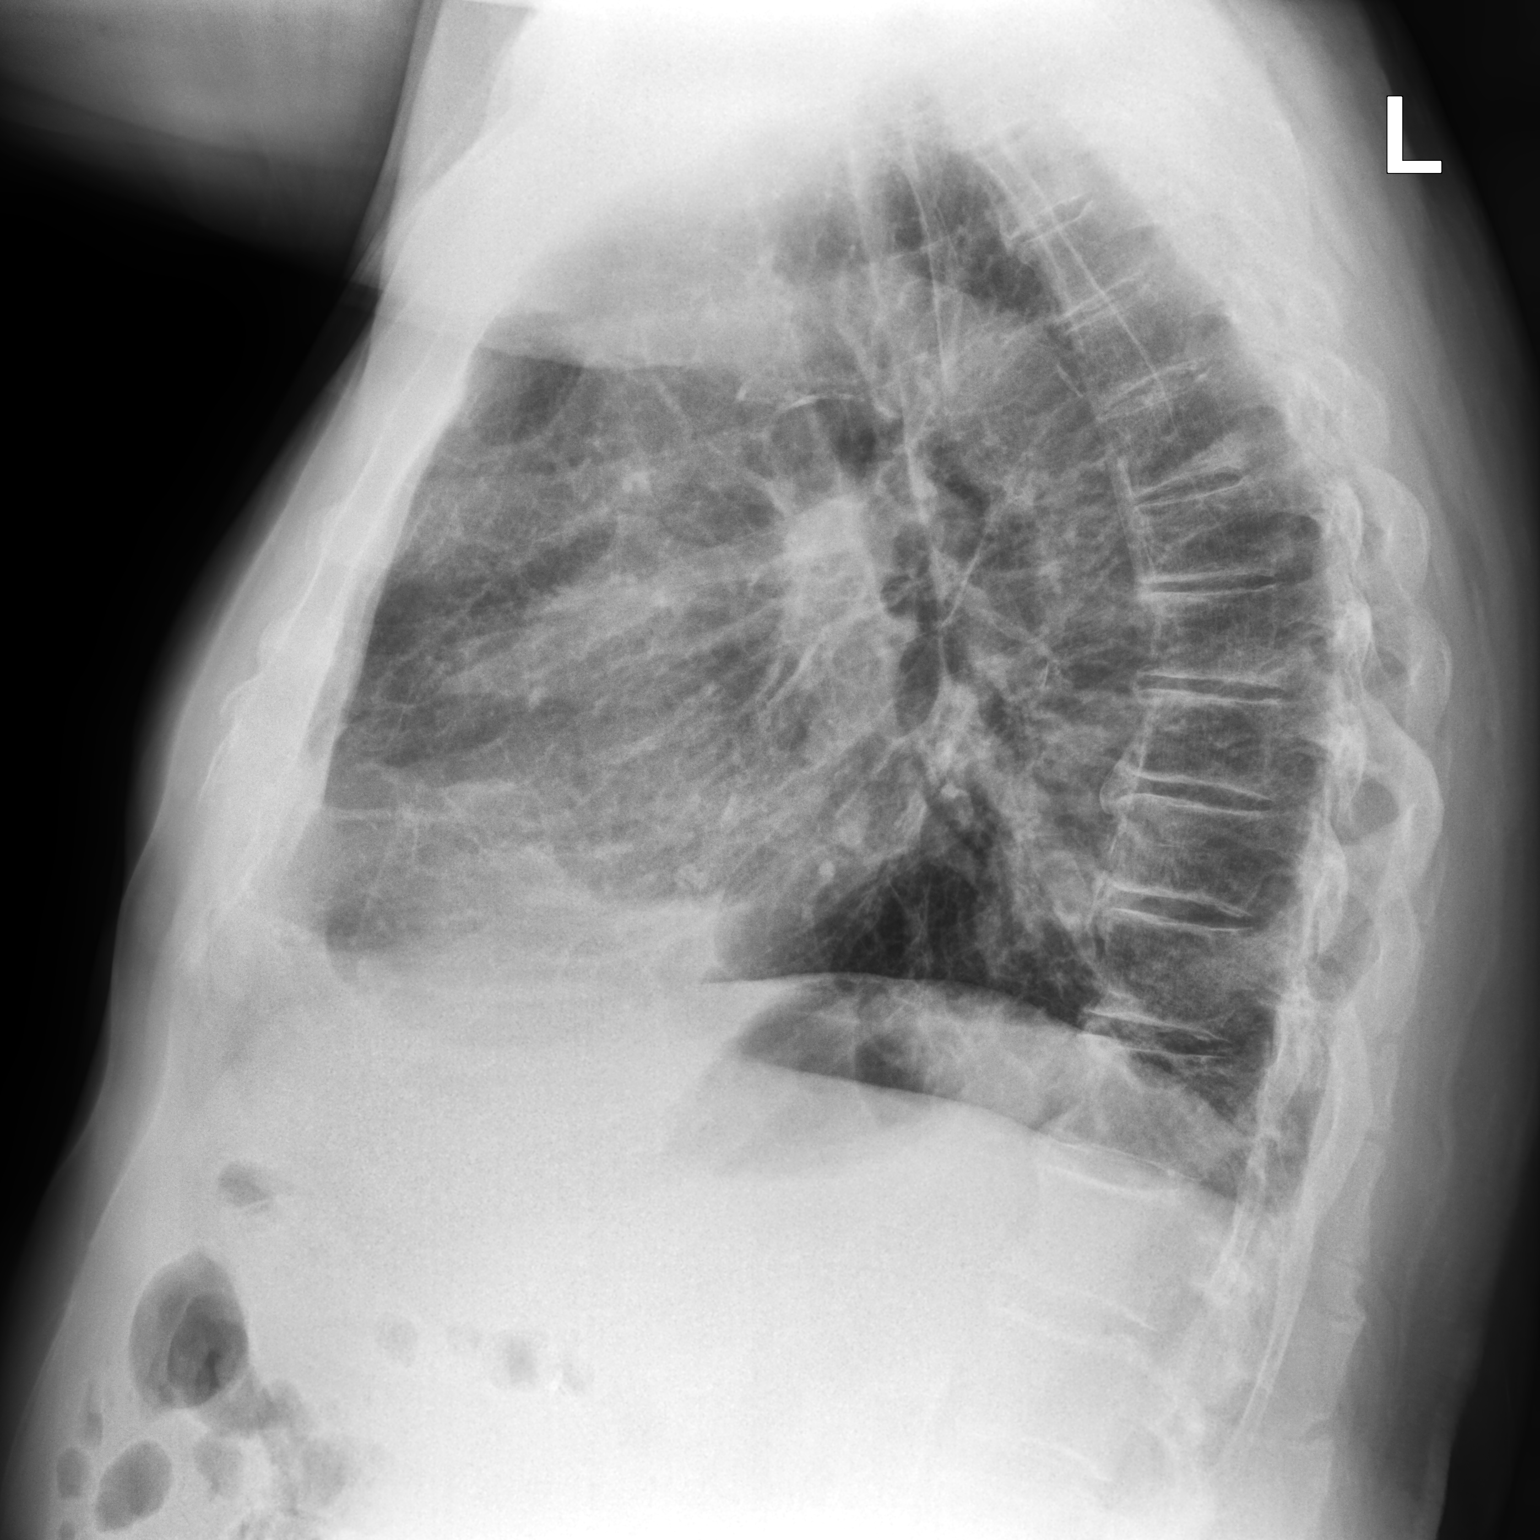

[2 of 2 positions shown; findings below may reference images not displayed]

FINDINGS: There is underlying emphysematous change, better delineated on prior
CT. There is scarring in the left base. There is no edema or
airspace opacity. Heart size and pulmonary vascularity are within
normal limits. There is aortic atherosclerosis. No bone lesions.
IMPRESSION: Underlying emphysematous change. Scarring left base. No edema or
airspace opacity. Stable cardiac silhouette. There is aortic
atherosclerosis.

Aortic Atherosclerosis ([MI]-[MI]) and Emphysema ([MI]-[MI]).

## 2020-04-09 MED ORDER — BREZTRI AEROSPHERE 160-9-4.8 MCG/ACT IN AERO: 2.0000 | INHALATION_SPRAY | Freq: Two times a day (BID) | RESPIRATORY_TRACT | 0 refills | Status: DC

## 2020-04-09 MED ORDER — DOXYCYCLINE HYCLATE 100 MG PO TABS: 100.0000 mg | ORAL_TABLET | Freq: Two times a day (BID) | ORAL | 0 refills | Status: DC

## 2020-04-09 MED ORDER — PREDNISONE 10 MG PO TABS: ORAL_TABLET | ORAL | 0 refills | Status: DC

## 2020-04-09 MED ORDER — ALBUTEROL SULFATE HFA 108 (90 BASE) MCG/ACT IN AERS: 1.0000 | INHALATION_SPRAY | Freq: Four times a day (QID) | RESPIRATORY_TRACT | 6 refills | Status: DC | PRN

## 2020-04-09 NOTE — Progress Notes (Signed)
History of Present Illness James [name] is a 75 y.o. maleremote  smoker ( Quit 2015 with a 75 pack year smoking history, sneaks an occasional cigarette) with COPD/emphysema, chronic hypoxic respiratory failure on supplemental oxygen at night and with  Exertion. He is also followed for a  pulmonary nodule.He is followed by Dr. Halford Chessman.   04/09/2020  Pt. Presents for acute visit. Pt. wife called the office 04/04/2020 stating the patient has had worsening shortness of breath for the past 4 days. He is currently on 3L. He was prescribed the O2 at night but has been using it more during the day recently. He noticed a small amount of blood while coughing the morning of 04/04/2020. He is fully vaccinated but had not tested for covid. Pt. Wife  does have at home covid tests and will test him tonight. She was  to call our office ASAP if the results are positive. She was told that since the patient has not been seen in the office since 05/2018, and with his current symptoms, the patient needs to go to the ED.Pt. wife states she would prefer to start with an OV. She was told it was our recommendation he go to the ED. If she chose not to go, and if the patient worsened, she was advised to call 911.  Pt. Presents today for follow up. He states he has had worsening shortness for breath for about 1 month.He does continue to smoke almost every day. ( 1-2 cigarettes). He has had decreased mobility due to Parkinson's for about 6 months. He get short of breath with minimal exertion. Secretions are yellowish color. He did have some blood tinged last week, but this was just once and has subsequently cleared. He denies any fever, chest pain, denies swelling in legs or feet. He is wheezing in the morning when he wakes up. Per his wife he is also wheezing at night. The patient does not use any inhalers. He states in the past they have not helped him. He does not use either rescue or maintenance.He is willing to try these now. We  discussed that maintenance medications need to be used  Every day without fail. Feel good, feel bad, they must be taken. We have also reviewed that rescue medication need to be used for breakthrough shortness of breath or wheezing. If needed more than 2-3 times daily, then he needs to come in to be seen. No leg pain or swelling. No tenderness. On exam no edema or tenderness. No redness, no firmness.  CXR 04/09/2020 Underlying emphysematous change. Scarring left base. No edema or airspace opacity. Stable cardiac silhouette. + aortic atherosclerosis.   Test Results: Spirometry 06/08/12 >> FEV1 1.91 (49%), FEV1% 59 ONO with RA 06/16/12 >>Test time 9 hrs 21 min. Mean SpO2 89%, low SpO2 63%. Spent 2 hrs 25 min with SpO2 <88% 07/07/12 >> SpO2 86% on room air with exertion >> start 2 liters oxygen with exertion and sleep A1AT 07/07/12 >> 154, PI-MM  Chest imaging CT chest 03/23/13 >> mild/diffuse centrilobular emphysema, 4 mm nodule RLL superior segment, 4 mm nodule LLL CT chest 02/20/14 >> moderate centrilobular/paraseptal emphysema, mild diffuse bronchial wall thickening, 3 mm nodule RLL and 5 mm nodule LLL no change CT chest 02/13/15 >> new 1.5 cm nodule RLL. CT chest 05/28/15 >> increased RLL irregular nodule to 2 cm PET scan 06/14/15 >> 1.93 SUV RLL 11 mm lesion CT chest 11/13/15 >> no progression CT chest 05/28/16 >> no change in lung  nodule CT chest 06/01/17 >> no change CT Chest 07/2018>> Stable exam. No change in the appearance of posterior right lower lobe lung nodules measuring up to 1.3 cm. Stable 24 months. CAD  Sleep tests: PSG 01/25/08 >>RDI 6.8, SpO2 low 84%. Spent 79% of test time with SpO2 <90%  CBC Latest Ref Rng & Units 10/14/2019 06/20/2019 03/22/2019  WBC 4.0 - 10.5 K/uL 6.9 5.8 6.3  Hemoglobin 13.0 - 17.0 g/dL 13.1 15.4 13.5  Hematocrit 39.0 - 52.0 % 41.7 47.5 43.0  Platelets 150 - 400 K/uL 278 215 220    BMP Latest Ref Rng & Units 03/18/2013 02/08/2008 11/24/2006   Glucose 70 - 99 mg/dL 130(H) 92 106(H)  BUN 6 - 23 mg/dL 14 8 10   Creatinine 0.4 - 1.5 mg/dL 1.1 0.92 0.92  Sodium 135 - 145 mEq/L 141 143 141  Potassium 3.5 - 5.1 mEq/L 3.9 4.1 4.8  Chloride 96 - 112 mEq/L 102 107 106  CO2 19 - 32 mEq/L 33(H) 31 29  Calcium 8.4 - 10.5 mg/dL 9.4 9.0 9.3    BNP No results found for: BNP  ProBNP No results found for: PROBNP  PFT    Component Value Date/Time   FEV1PRE 1.79 06/26/2015 1249   FEV1POST 2.19 06/26/2015 1249   FVCPRE 3.24 06/26/2015 1249   FVCPOST 3.85 06/26/2015 1249   TLC 7.51 06/26/2015 1249   DLCOUNC 16.61 06/26/2015 1249   PREFEV1FVCRT 55 06/26/2015 1249   PSTFEV1FVCRT 57 06/26/2015 1249    DG Chest 2 View  Result Date: 04/09/2020 CLINICAL DATA:  Shortness of breath EXAM: CHEST - 2 VIEW COMPARISON:  Chest radiograph June 09, 2022; chest CT August 11, 2018 FINDINGS: There is underlying emphysematous change, better delineated on prior CT. There is scarring in the left base. There is no edema or airspace opacity. Heart size and pulmonary vascularity are within normal limits. There is aortic atherosclerosis. No bone lesions. IMPRESSION: Underlying emphysematous change. Scarring left base. No edema or airspace opacity. Stable cardiac silhouette. There is aortic atherosclerosis. Aortic Atherosclerosis (ICD10-I70.0) and Emphysema (ICD10-J43.9). Electronically Signed   By: Lowella Grip III M.D.   On: 04/09/2020 10:44     Past medical hx Past Medical History:  Diagnosis Date  . Allergy    seasonal  . Blind left eye    legally  . Blood transfusion    2002  . COPD (chronic obstructive pulmonary disease) ([place])   . Depression   . Diabetes mellitus, type 2 ([place])   . Diverticulosis   . ED (erectile dysfunction)   . Glaucoma   . Hyperlipidemia   . Hypertension   . Insomnia   . Internal and external bleeding hemorrhoids   . Iron deficiency anemia   . OSA (obstructive sleep apnea)   . Osteoarthritis   . Rosacea   . Tremor  of both hands   . Tubular adenoma of colon 03/2011     Social History   Tobacco Use  . Smoking status: Current Some Day Smoker    Packs/day: 1.50    Years: 50.00    Pack years: 75.00    Types: Cigarettes    Last attempt to quit: 04/29/2013    Years since quitting: 6.9  . Smokeless tobacco: Never Used  . Tobacco comment: Stateshe sometimes sneaks a cigarette  Substance Use Topics  . Alcohol use: Yes    Alcohol/week: 0.0 standard drinks    Comment: once yearly  . Drug use: No    Mr.Ploeger reports that he  has been smoking cigarettes. He has a 75.00 pack-year smoking history. He has never used smokeless tobacco. He reports current alcohol use. He reports that he does not use drugs.  Tobacco Cessation: Current some day smoker with a 75 pack year smoking history.  Past surgical hx, Family hx, Social hx all reviewed.  Current Outpatient Medications on File Prior to Visit  Medication Sig  . atorvastatin (LIPITOR) 20 MG tablet Take 20 mg by mouth at bedtime.   Marland Kitchen buPROPion (WELLBUTRIN XL) 300 MG 24 hr tablet Take 300 mg by mouth daily.  . calcium-vitamin D (OSCAL WITH D) 250-125 MG-UNIT per tablet Take 1 tablet by mouth daily.  . carbidopa-levodopa (SINEMET IR) 25-100 MG tablet Take 1.5 tablets by mouth 3 (three) times daily.  . Carbidopa-Levodopa ER (SINEMET CR) 25-100 MG tablet controlled release Take 1 tablet by mouth 2 (two) times daily.  . cetirizine (ZYRTEC) 10 MG tablet Take 10 mg by mouth daily.  Marland Kitchen donepezil (ARICEPT) 10 MG tablet Take 1 tablet (10 mg total) by mouth at bedtime.  . ferrous gluconate (FERGON) 324 MG tablet TAKE 1 TABLET BY MOUTH DAILY WITH BREAKFAST  . ferrous sulfate 325 (65 FE) MG tablet Take 325 mg by mouth 2 (two) times daily with a meal.   . Green Tea 315 MG CAPS Take 315 mg by mouth daily.  . hydrochlorothiazide (HYDRODIURIL) 25 MG tablet Take 25 mg by mouth daily.  Marland Kitchen MEGARED OMEGA-3 KRILL OIL PO Take 1 capsule by mouth daily.  . metFORMIN (GLUCOPHAGE) 500  MG tablet Take 1,000 mg by mouth 2 (two) times daily with a meal.   . Multiple Vitamins-Minerals (MULTIVITAMIN WITH MINERALS) tablet Take 1 tablet by mouth daily.  Marland Kitchen omeprazole (PRILOSEC OTC) 20 MG tablet Take 20 mg by mouth daily with breakfast.  . sertraline (ZOLOFT) 100 MG tablet Take 150 mg by mouth daily.  . vardenafil (LEVITRA) 20 MG tablet Take 20 mg by mouth daily as needed for erectile dysfunction.    No current facility-administered medications on file prior to visit.     Allergies  Allergen Reactions  . Aspirin Other (See Comments)  . Codeine Itching  . Morphine Itching    Review Of Systems:  Constitutional:   No  weight loss, night sweats,  Fevers, chills, fatigue, or  lassitude.  HEENT:   No headaches,  Difficulty swallowing,  Tooth/dental problems, or  Sore throat,                No sneezing, itching, ear ache, nasal congestion, post nasal drip,   CV:  No chest pain,  Orthopnea, PND, swelling in lower extremities, anasarca, dizziness, palpitations, syncope.   GI  No heartburn, indigestion, abdominal pain, nausea, vomiting, diarrhea, change in bowel habits, loss of appetite, bloody stools.   Resp: + shortness of breath with exertion or at rest.  + excess mucus, + productive cough,  No non-productive cough,  No coughing up of blood.  + change in color of mucus.  + wheezing.  No chest wall deformity  Skin: no rash or lesions.  GU: no dysuria, change in color of urine, no urgency or frequency.  No flank pain, no hematuria   MS:  No joint pain or swelling.  No decreased range of motion.  No back pain.  Psych:  No change in mood or affect. No depression or anxiety.  No memory loss.   Vital Signs BP 124/64 (BP Location: Right Arm, Cuff Size: Normal)   Pulse 74  Temp (!) 97.3 F (36.3 C) (Oral)   Ht 6\' 1"  (1.854 m)   Wt 236 lb 9.6 oz (107.3 kg)   SpO2 94%   BMI 31.22 kg/m    Physical Exam:  General- No distress,  A&Ox3, pleasant ENT: No sinus tenderness, TM  clear, pale nasal mucosa, no oral exudate,no post nasal drip, no LAN Cardiac: S1, S2, regular rate and rhythm, no murmur Chest: + wheeze/ rales/ No dullness; no accessory muscle use, no nasal flaring, no sternal retractions, diminished per bases Abd.: Soft Non-tender, ND, BS +, Body mass index is 31.22 kg/m. Ext: No clubbing cyanosis, edema Neuro:  normal strength, MAE x 4, A&O x 3, appropriate Skin: No rashes, warm and dry, no lesions Psych: normal mood and behavior   Assessment/Plan  Acute exacerbation COPD with emphysema. Fully vaccinated against Covid 19 Plan - CXR today>> emphysema - Start therapeutic Trial of Breztri - Two puffs in the morning, 2 puffs in the evening - Rinse mouth after use - We will send in a prescription for  Doxycycline 1 tablet twice daily - Take probiotic while on antibiotic.  - Avoid sunlight while on Doxycycline - We will prescribe Prednisone taper; 10 mg tablets: 4 tabs x 2 days, 3 tabs x 2 days, 2 tabs x 2 days 1 tab x 2 days then stop. -This will increase your blood sugars, please monitor them closely. - Wear oxygen as needed to maintain sats 88-92% - Follow up in 2 weeks with [name] NP or Dr. Halford Chessman. - Will need PFT's when better.   Chronic respiratory failure with nocturnal hypoxemia from COPD. - continue 3 liters oxygen at night, and as needed to maintain oxygen sats 88-92% - Wear oxygen as needed to maintain sats 88-92% - Will need ambulatory oximetry to see if he needs ambulatory oxygen once he has recovered from this flare.   Right lower lung mass. Stable x 2 years in 2020 Current some day smoker - overdue for  f/u CT w/o contrast for June  2020 - Will need to schedule once over current flare - Counseled on smoking cessation.   Upper airway cough syndrome from rhinitis and post-nasal drip. - prn OTC antihistamine  Note your daily symptoms >remember "red flags" for COPD: Increase in cough, increase in sputum production, increase in  shortness of breath or activity intolerance. If you notice these symptoms, please call to be seen.    This appointment was over 40 minutes long with over 50% of the time being direct face to face patient care, assessment , plan of care , and follow up,   Magdalen Spatz, NP 04/09/2020  11:12 AM

## 2020-04-09 NOTE — Addendum Note (Signed)
Addended byCoralie Keens on: 04/09/2020 04:12 PM   Modules accepted: Orders

## 2020-04-09 NOTE — Patient Instructions (Addendum)
It is good to see you today. Your CXR shows emphysema, no pneumonia We will start a therapeutic trial of Breztri Take 2 puffs, twice daily. Rinse mouth after use.  We will send in a prescription for Doxycycline 1 tablet twice daily Take probiotic while on antibiotic.  Avoid sunlight while on Doxycycline We will prescribe Prednisone taper; 10 mg tablets: 4 tabs x 2 days, 3 tabs x 2 days, 2 tabs x 2 days 1 tab x 2 days then stop. This will increase your blood sugars, please monitor them closely. We will send in a prescription for albuterol.  This is a rescue medication. Use as needed for breakthrough shortness of breath or wheezing.  If you need this more that 3 times daily, please call to be seen.  Follow up in 2 weeks with [name] NP or Dr. Halford Chessman. Will need PFT's when better. Needs CT Chest for nodule follow up when better. Please work on quitting smoking completely. Do not smoke while wearing oxygen. Wear oxygen as needed to maintain sats 88-92% Will need ambulatory oximetry to see if he needs ambulatory oxygen once he has recovered from this flare.  Please contact office for sooner follow up if symptoms do not improve or worsen or seek emergency care

## 2020-04-11 NOTE — Progress Notes (Signed)
Reviewed and agree with assessment/plan.   Chesley Mires, MD [facility] 04/11/2020, 11:01 AM Pager:  [phone]

## 2020-04-17 ENCOUNTER — Inpatient Hospital Stay: Payer: Medicare Other | Attending: Hematology and Oncology

## 2020-04-17 ENCOUNTER — Other Ambulatory Visit: Payer: Self-pay

## 2020-04-17 DIAGNOSIS — D509 Iron deficiency anemia, unspecified: Secondary | ICD-10-CM | POA: Diagnosis not present

## 2020-04-17 DIAGNOSIS — Z79899 Other long term (current) drug therapy: Secondary | ICD-10-CM | POA: Diagnosis not present

## 2020-04-17 LAB — IRON AND TIBC
Iron: 20 ug/dL — ABNORMAL LOW (ref 42–163)
Saturation Ratios: 5 % — ABNORMAL LOW (ref 20–55)
TIBC: 411 ug/dL — ABNORMAL HIGH (ref 202–409)
UIBC: 391 ug/dL — ABNORMAL HIGH (ref 117–376)

## 2020-04-17 LAB — CBC WITH DIFFERENTIAL (CANCER CENTER ONLY)
Abs Immature Granulocytes: 0.12 10*3/uL — ABNORMAL HIGH (ref 0.00–0.07)
Basophils Absolute: 0.1 10*3/uL (ref 0.0–0.1)
Basophils Relative: 1 %
Eosinophils Absolute: 0.1 10*3/uL (ref 0.0–0.5)
Eosinophils Relative: 1 %
HCT: 37.4 % — ABNORMAL LOW (ref 39.0–52.0)
Hemoglobin: 11.1 g/dL — ABNORMAL LOW (ref 13.0–17.0)
Immature Granulocytes: 1 %
Lymphocytes Relative: 21 %
Lymphs Abs: 1.9 10*3/uL (ref 0.7–4.0)
MCH: 23 pg — ABNORMAL LOW (ref 26.0–34.0)
MCHC: 29.7 g/dL — ABNORMAL LOW (ref 30.0–36.0)
MCV: 77.4 fL — ABNORMAL LOW (ref 80.0–100.0)
Monocytes Absolute: 1 10*3/uL (ref 0.1–1.0)
Monocytes Relative: 11 %
Neutro Abs: 5.9 10*3/uL (ref 1.7–7.7)
Neutrophils Relative %: 65 %
Platelet Count: 308 10*3/uL (ref 150–400)
RBC: 4.83 MIL/uL (ref 4.22–5.81)
RDW: 15.9 % — ABNORMAL HIGH (ref 11.5–15.5)
WBC Count: 9.1 10*3/uL (ref 4.0–10.5)
nRBC: 0 % (ref 0.0–0.2)

## 2020-04-17 LAB — FERRITIN: Ferritin: <4 ng/mL — ABNORMAL LOW (ref 24–336)

## 2020-04-17 NOTE — Progress Notes (Signed)
Please let patient know his CXR did not show pneumonia. He should have finished his Doxy by now, and is pred taper. When you call him ask if he is feeling better. He has follow up 05/02/2020. If he is still struggling , see if you can get him in sooner.Thanks so much

## 2020-04-18 NOTE — Progress Notes (Signed)
Patient Care Team: [name], MD as PCP - General (Internal Medicine)  DIAGNOSIS:    ICD-10-CM   1. Iron deficiency anemia, unspecified iron deficiency anemia type  D50.9     CHIEF COMPLIANT: Follow-up of iron deficiency anemia  INTERVAL HISTORY: James [name] is a 75 y.o. with above-mentioned history of iron deficiency anemia currently on oral ironand receiving IV iron (last2/12/21). Labs on 04/17/20 showed Hg 11.1, HCT 37.4, platelets 308, ferritin <4, iron saturation 5%. He presents to the clinic todayto review his labs.   ALLERGIES:  is allergic to aspirin, codeine, and morphine.  MEDICATIONS:  Current Outpatient Medications  Medication Sig Dispense Refill   albuterol (VENTOLIN HFA) 108 (90 Base) MCG/ACT inhaler Inhale 1-2 puffs into the lungs every 6 (six) hours as needed for wheezing or shortness of breath. 8 g 6   atorvastatin (LIPITOR) 20 MG tablet Take 20 mg by mouth at bedtime.      Budeson-Glycopyrrol-Formoterol (BREZTRI AEROSPHERE) 160-9-4.8 MCG/ACT AERO Inhale 2 puffs into the lungs in the morning and at bedtime. 4.8 g 0   buPROPion (WELLBUTRIN XL) 300 MG 24 hr tablet Take 300 mg by mouth daily.     calcium-vitamin D (OSCAL WITH D) 250-125 MG-UNIT per tablet Take 1 tablet by mouth daily.     carbidopa-levodopa (SINEMET IR) 25-100 MG tablet Take 1.5 tablets by mouth 3 (three) times daily. 405 tablet 3   Carbidopa-Levodopa ER (SINEMET CR) 25-100 MG tablet controlled release Take 1 tablet by mouth 2 (two) times daily. 180 tablet 3   cetirizine (ZYRTEC) 10 MG tablet Take 10 mg by mouth daily.     donepezil (ARICEPT) 10 MG tablet Take 1 tablet (10 mg total) by mouth at bedtime. 90 tablet 3   doxycycline (VIBRA-TABS) 100 MG tablet Take 1 tablet (100 mg total) by mouth 2 (two) times daily. 14 tablet 0   ferrous gluconate (FERGON) 324 MG tablet TAKE 1 TABLET BY MOUTH DAILY WITH BREAKFAST 90 tablet 3   ferrous sulfate 325 (65 FE) MG tablet Take 325 mg by  mouth 2 (two) times daily with a meal.      Green Tea 315 MG CAPS Take 315 mg by mouth daily.     hydrochlorothiazide (HYDRODIURIL) 25 MG tablet Take 25 mg by mouth daily.     MEGARED OMEGA-3 KRILL OIL PO Take 1 capsule by mouth daily.     metFORMIN (GLUCOPHAGE) 500 MG tablet Take 1,000 mg by mouth 2 (two) times daily with a meal.      Multiple Vitamins-Minerals (MULTIVITAMIN WITH MINERALS) tablet Take 1 tablet by mouth daily.     omeprazole (PRILOSEC OTC) 20 MG tablet Take 20 mg by mouth daily with breakfast.     predniSONE (DELTASONE) 10 MG tablet Take 4 tabs x 2 days, then 3 x 2d, then 2 x 2d, then 1 x 2d and STOP 20 tablet 0   sertraline (ZOLOFT) 100 MG tablet Take 150 mg by mouth daily.     vardenafil (LEVITRA) 20 MG tablet Take 20 mg by mouth daily as needed for erectile dysfunction.      No current facility-administered medications for this visit.    PHYSICAL EXAMINATION: ECOG PERFORMANCE STATUS: 1 - Symptomatic but completely ambulatory  Vitals:   04/19/20 1057  BP: (!) 143/69  Pulse: 73  Resp: 20  Temp: 99 F (37.2 C)  SpO2: 94%   Filed Weights   04/19/20 1057  Weight: 244 lb 1.6 oz (110.7 kg)  LABORATORY DATA:  I have reviewed the data as listed CMP Latest Ref Rng & Units 03/18/2013 02/08/2008 11/24/2006  Glucose 70 - 99 mg/dL 130(H) 92 106(H)  BUN 6 - 23 mg/dL 14 8 10   Creatinine 0.4 - 1.5 mg/dL 1.1 0.92 0.92  Sodium 135 - 145 mEq/L 141 143 141  Potassium 3.5 - 5.1 mEq/L 3.9 4.1 4.8  Chloride 96 - 112 mEq/L 102 107 106  CO2 19 - 32 mEq/L 33(H) 31 29  Calcium 8.4 - 10.5 mg/dL 9.4 9.0 9.3    Lab Results  Component Value Date   WBC 9.1 04/17/2020   HGB 11.1 (L) 04/17/2020   HCT 37.4 (L) 04/17/2020   MCV 77.4 (L) 04/17/2020   PLT 308 04/17/2020   NEUTROABS 5.9 04/17/2020    ASSESSMENT & PLAN:  Iron deficiency anemia Lab review:  08/19/2018: Hemoglobin 11.7, MCV 74.3, RDW 18.2, rest of the CBC and platelet count normal 08/13/2017: Hemoglobin  14.8, MCV 92.4, WBC 9, platelets 245 12/20/2018: Hemoglobin 12.2, MCV 75, RDW 18.5, platelets 239, ferritin 6, iron saturation 6%, TIBC 421 06/20/2019: Hemoglobin 15.4, MCV 93.5, RDW 15.8, platelets 215, ferritin 20, iron saturation 21%, TIBC 386 10/14/2019: Hemoglobin 13.1, MCV 86.2, ferritin 6, iron saturation 7% 04/18/20: Hb 11.1, MCV 77, Iron Sat: 5%, TIBC 411, Ferritin < 4  IV iron: November 2020, Jan 2021(Venofer x4), Aug 2021  Recommend 3 doses of Venofer. F/U in 6 months with labs done ahead of time.    No orders of the defined types were placed in this encounter.  The patient has a good understanding of the overall plan. he agrees with it. he will call with any problems that may develop before the next visit here.  Total time spent: 20 mins including face to face time and time spent for planning, charting and coordination of care  Rulon Eisenmenger, MD, MPH 04/19/2020  I, Cloyde Reams Dorshimer, am acting as scribe for Dr. Nicholas Lose.  I have reviewed the above documentation for accuracy and completeness, and I agree with the above.

## 2020-04-18 NOTE — Assessment & Plan Note (Signed)
Lab review:  08/19/2018: Hemoglobin 11.7, MCV 74.3, RDW 18.2, rest of the CBC and platelet count normal 08/13/2017: Hemoglobin 14.8, MCV 92.4, WBC 9, platelets 245 12/20/2018: Hemoglobin 12.2, MCV 75, RDW 18.5, platelets 239, ferritin 6, iron saturation 6%, TIBC 421 06/20/2019: Hemoglobin 15.4, MCV 93.5, RDW 15.8, platelets 215, ferritin 20, iron saturation 21%, TIBC 386 10/14/2019: Hemoglobin 13.1, MCV 86.2, ferritin 6, iron saturation 7% 04/18/20: Hb 11.1, MCV 77, Iron Sat: 5%, TIBC 411, Ferritin < 4  IV iron: November 2020, Jan 2021(Venofer x4), Aug 2021  Recommend 3 doses of Venofer. F/U in 6 months with labs done ahead of time.

## 2020-04-19 ENCOUNTER — Encounter: Payer: Self-pay | Admitting: Family Medicine

## 2020-04-19 ENCOUNTER — Inpatient Hospital Stay (HOSPITAL_BASED_OUTPATIENT_CLINIC_OR_DEPARTMENT_OTHER): Payer: Medicare Other | Admitting: Hematology and Oncology

## 2020-04-19 ENCOUNTER — Other Ambulatory Visit: Payer: Self-pay

## 2020-04-19 ENCOUNTER — Other Ambulatory Visit: Payer: Self-pay | Admitting: *Deleted

## 2020-04-19 DIAGNOSIS — Z79899 Other long term (current) drug therapy: Secondary | ICD-10-CM | POA: Diagnosis not present

## 2020-04-19 DIAGNOSIS — G20 Parkinson's disease: Secondary | ICD-10-CM

## 2020-04-19 DIAGNOSIS — D509 Iron deficiency anemia, unspecified: Secondary | ICD-10-CM

## 2020-04-19 MED ORDER — CARBIDOPA-LEVODOPA ER 25-100 MG PO TBCR: 1.0000 | EXTENDED_RELEASE_TABLET | Freq: Two times a day (BID) | ORAL | 0 refills | Status: DC

## 2020-04-27 ENCOUNTER — Ambulatory Visit: Payer: Medicare Other

## 2020-04-27 ENCOUNTER — Inpatient Hospital Stay: Payer: Medicare Other | Attending: Hematology and Oncology

## 2020-04-27 ENCOUNTER — Other Ambulatory Visit: Payer: Self-pay

## 2020-04-27 VITALS — BP 142/65 | HR 79 | Temp 97.9°F | Resp 20

## 2020-04-27 DIAGNOSIS — D500 Iron deficiency anemia secondary to blood loss (chronic): Secondary | ICD-10-CM | POA: Insufficient documentation

## 2020-04-27 DIAGNOSIS — K922 Gastrointestinal hemorrhage, unspecified: Secondary | ICD-10-CM | POA: Diagnosis not present

## 2020-04-27 DIAGNOSIS — D509 Iron deficiency anemia, unspecified: Secondary | ICD-10-CM

## 2020-04-27 MED ORDER — SODIUM CHLORIDE 0.9 % IV SOLN
Freq: Once | INTRAVENOUS | Status: AC
  Filled 2020-04-27: qty 250

## 2020-04-27 MED ORDER — SODIUM CHLORIDE 0.9 % IV SOLN
300.0000 mg | Freq: Once | INTRAVENOUS | Status: AC
  Administered 2020-04-27: 300 mg via INTRAVENOUS
  Filled 2020-04-27: qty 15

## 2020-04-27 NOTE — Progress Notes (Signed)
During iron treatment PIV noted to be infiltrated on right lower forearm and medication under the skin. Pharmacy notified and cold packs applied per protocol. Pharmacy stated there was no antidote  Site marked and patient to follow up next week with provider. Patient educated to call the on call provider this weekend if the site worsens or becomes painful. Patient denies pain besides at PIV insertion site. Infiltration 9.5 inches in length and 3.5 inches in width. Edema most noted in hand and slightly in fingers. Patient is able to move all digits of right hand and wrist. Patient educated that iron would peridentally stain his skin and would no go away. Patient stated he understood and that there was nothing staff could have done.    5pm During patient's wait time for venofer patient stated his hand was starting to hurt from the swelling. Dr. Alen Blew notified. Dr. Alen Blew came and saw patient in infusion room. Md stated for patient to monitor for red streaking and to continue to ice arm and hand at home.   5:18pm Patient walked to his car. His wife was waiting for him. Wife educated and notified of event. Wife stated she was a Equities trader and understood the directions and would monitor for any changes now until his follow up appointment next week.

## 2020-04-27 NOTE — Patient Instructions (Signed)
IV Infiltration IV infiltration is when fluid from your IV tube leaks under your skin. This can happen if the IV needle comes out of the vein or if the IV pokes through the vein to the other side. What are the causes?  Your IV needle is not inserted in the correct position.  Your IV needle moves after being positioned correctly.  You have swelling (inflammation) or blood flow problems near the IV needle. What are the signs or symptoms?  Pain at the IV site.  Skin at the IV site that is: ? Swollen. ? Tight. ? Stiff.  Skin at the IV site that is: ? Red or white. ? Cool-feeling.  A damp or wet bandage (dressing), if you have one.  An IV that works more slowly than normal.  Burning or loss of skin at the IV site. This may happen in very bad cases. How is this treated? Treatment for mild cases may include:  Removing your IV.  Applying ice or heat to the area.  Keeping your arm raised (elevated) when at rest.  Wearing a bandage if your injection site is leaking.  Injecting medicine into the area to help your body absorb the leaking fluid. Treatment for very bad cases may include surgery to remove skin. Follow these instructions at home: Managing pain, stiffness, and swelling  If told, put ice on the swollen area. To do this: ? Put ice in a plastic bag. ? Place a towel between your skin and the bag. ? Leave the ice on for 20 minutes, 2-3 times a day. ? Take off the ice if your skin turns bright red. This is very important. If you cannot feel pain, heat, or cold, you have a greater risk of damage to the area.  Raise the swollen area above the level of your heart while you are sitting or lying down.  If told, put heat on the swollen area. Do this as often as told by your doctor. Use the heat source that your doctor recommends, such as a moist heat pack or a heating pad. ? Place a towel between your skin and the heat source. ? Leave the heat on for 20-30 minutes. ? Take  off the heat if your skin turns bright red. This is very important. If you cannot feel pain, heat, or cold, you have a greater risk of getting burned.      General instructions  Take over-the-counter and prescription medicines only as told by your doctor.  Do not wear tight-fitting clothing, watches, or jewelry near the swollen area. Contact a doctor if:  You have a fever or chills.  Your skin at the IV site becomes: ? Swollen. ? Red. ? Painful.  Your skin feels numb.  Your skin feels tight or cool to the touch.  You have blisters around the IV site.  Your skin changes color or is bruised.  You cannot feel your pulse where the IV was placed. Get help right away if:  Your skin around the IV site turns dark and peels.  You have red streaks on your skin coming from the IV site.  You have pus coming from the IV site. Summary  IV infiltration is when fluid from your IV tube leaks under your skin.  This can happen if the IV needle comes out of the vein or if the IV pokes through the vein to the other side.  Treatment may include removing your IV, putting ice or heat on the area, and  wearing a bandage if your injection site is leaking.  Take over-the-counter and prescription medicines only as told by your doctor.  Contact a doctor if your skin at the IV site becomes swollen, red, or painful. This information is not intended to replace advice given to you by your health care provider. Make sure you discuss any questions you have with your health care provider. Document Revised: 09/24/2019 Document Reviewed: 09/24/2019 Elsevier Patient Education  [place].

## 2020-04-28 ENCOUNTER — Other Ambulatory Visit (HOSPITAL_COMMUNITY)
Admission: RE | Admit: 2020-04-28 | Discharge: 2020-04-28 | Disposition: A | Discharge status: Home / Self Care | Payer: Medicare Other | Source: Ambulatory Visit | Attending: Acute Care | Admitting: Acute Care

## 2020-04-28 DIAGNOSIS — Z20822 Contact with and (suspected) exposure to covid-19: Secondary | ICD-10-CM | POA: Diagnosis not present

## 2020-04-28 DIAGNOSIS — Z01812 Encounter for preprocedural laboratory examination: Secondary | ICD-10-CM | POA: Insufficient documentation

## 2020-04-28 LAB — SARS CORONAVIRUS 2 (TAT 6-24 HRS): SARS Coronavirus 2: NEGATIVE

## 2020-05-01 ENCOUNTER — Inpatient Hospital Stay: Payer: Medicare Other

## 2020-05-01 ENCOUNTER — Other Ambulatory Visit: Payer: Self-pay

## 2020-05-01 ENCOUNTER — Inpatient Hospital Stay (HOSPITAL_BASED_OUTPATIENT_CLINIC_OR_DEPARTMENT_OTHER): Payer: Medicare Other | Admitting: Medical

## 2020-05-01 VITALS — BP 137/69 | HR 72 | Temp 98.8°F | Resp 20

## 2020-05-01 DIAGNOSIS — D509 Iron deficiency anemia, unspecified: Secondary | ICD-10-CM

## 2020-05-01 DIAGNOSIS — K922 Gastrointestinal hemorrhage, unspecified: Secondary | ICD-10-CM | POA: Diagnosis not present

## 2020-05-01 DIAGNOSIS — D500 Iron deficiency anemia secondary to blood loss (chronic): Secondary | ICD-10-CM

## 2020-05-01 MED ORDER — SODIUM CHLORIDE 0.9 % IV SOLN
300.0000 mg | Freq: Once | INTRAVENOUS | Status: AC
  Administered 2020-05-01: 300 mg via INTRAVENOUS
  Filled 2020-05-01: qty 15

## 2020-05-01 MED ORDER — SODIUM CHLORIDE 0.9 % IV SOLN
Freq: Once | INTRAVENOUS | Status: AC
  Filled 2020-05-01: qty 250

## 2020-05-01 NOTE — Patient Instructions (Signed)

## 2020-05-02 ENCOUNTER — Ambulatory Visit (INDEPENDENT_AMBULATORY_CARE_PROVIDER_SITE_OTHER): Payer: Medicare Other | Admitting: Pulmonary Disease

## 2020-05-02 ENCOUNTER — Other Ambulatory Visit: Payer: Self-pay | Admitting: Acute Care

## 2020-05-02 ENCOUNTER — Ambulatory Visit (INDEPENDENT_AMBULATORY_CARE_PROVIDER_SITE_OTHER): Payer: Medicare Other | Admitting: Acute Care

## 2020-05-02 ENCOUNTER — Encounter: Payer: Self-pay | Admitting: Acute Care

## 2020-05-02 VITALS — BP 124/62 | HR 80 | Temp 97.7°F | Ht 75.0 in | Wt 243.2 lb

## 2020-05-02 DIAGNOSIS — J441 Chronic obstructive pulmonary disease with (acute) exacerbation: Secondary | ICD-10-CM

## 2020-05-02 DIAGNOSIS — I272 Pulmonary hypertension, unspecified: Secondary | ICD-10-CM | POA: Diagnosis not present

## 2020-05-02 DIAGNOSIS — R058 Other specified cough: Secondary | ICD-10-CM | POA: Diagnosis not present

## 2020-05-02 DIAGNOSIS — J439 Emphysema, unspecified: Secondary | ICD-10-CM

## 2020-05-02 DIAGNOSIS — R911 Solitary pulmonary nodule: Secondary | ICD-10-CM

## 2020-05-02 DIAGNOSIS — J9611 Chronic respiratory failure with hypoxia: Secondary | ICD-10-CM | POA: Diagnosis not present

## 2020-05-02 DIAGNOSIS — R0602 Shortness of breath: Secondary | ICD-10-CM

## 2020-05-02 LAB — PULMONARY FUNCTION TEST
DL/VA % pred: 49 %
DL/VA: 1.96 ml/min/mmHg/L
DLCO cor % pred: 41 %
DLCO cor: 11.54 ml/min/mmHg
DLCO unc % pred: 41 %
DLCO unc: 11.54 ml/min/mmHg
FEF 25-75 Post: 1.11 L/sec
FEF 25-75 Pre: 0.85 L/sec
FEF2575-%Change-Post: 31 %
FEF2575-%Pred-Post: 43 %
FEF2575-%Pred-Pre: 33 %
FEV1-%Change-Post: 10 %
FEV1-%Pred-Post: 58 %
FEV1-%Pred-Pre: 52 %
FEV1-Post: 2.04 L
FEV1-Pre: 1.84 L
FEV1FVC-%Change-Post: 4 %
FEV1FVC-%Pred-Pre: 71 %
FEV6-%Change-Post: 4 %
FEV6-%Pred-Post: 79 %
FEV6-%Pred-Pre: 75 %
FEV6-Post: 3.58 L
FEV6-Pre: 3.41 L
FEV6FVC-%Change-Post: 0 %
FEV6FVC-%Pred-Post: 102 %
FEV6FVC-%Pred-Pre: 102 %
FVC-%Change-Post: 5 %
FVC-%Pred-Post: 77 %
FVC-%Pred-Pre: 73 %
FVC-Post: 3.72 L
FVC-Pre: 3.51 L
Post FEV1/FVC ratio: 55 %
Post FEV6/FVC ratio: 96 %
Pre FEV1/FVC ratio: 52 %
Pre FEV6/FVC Ratio: 97 %
RV % pred: 142 %
RV: 3.84 L
TLC % pred: 102 %
TLC: 7.84 L

## 2020-05-02 MED ORDER — BREZTRI AEROSPHERE 160-9-4.8 MCG/ACT IN AERO: 2.0000 | INHALATION_SPRAY | Freq: Two times a day (BID) | RESPIRATORY_TRACT | 3 refills | Status: DC

## 2020-05-02 MED ORDER — BREZTRI AEROSPHERE 160-9-4.8 MCG/ACT IN AERO: 2.0000 | INHALATION_SPRAY | Freq: Two times a day (BID) | RESPIRATORY_TRACT | 0 refills | Status: DC

## 2020-05-02 NOTE — Progress Notes (Signed)
Reviewed and agree with assessment/plan.   Chesley Mires, MD [facility] Pulmonary/Critical Care 05/02/2020, 1:38 PM Pager:  [phone]

## 2020-05-02 NOTE — Addendum Note (Signed)
Addended by: Coralie Keens on: 05/02/2020 02:32 PM   Modules accepted: Orders

## 2020-05-02 NOTE — Patient Instructions (Addendum)
It is good to see you today. We will give you additional samples of Breztri and place a prescription. Continue 2 puffs twice dail;y Rinse mouth after use. Let us know if this drug is not covered, and we can consider Trelegy We can give you samples to try before we sent in a prescription. CT Chest 05/10/2020. We will place an order for a 2 D Echo>> (Please assess pulmonary artery pressures) . Continue wearing oxygen at 3 L at bedtime, and as needed.  Continue Zyrtec and Mucinex. Follow up after CT and Echo in 1 month with Dr. Halford Chessman or [name]. Note your daily symptoms > remember "red flags" for COPD:  Increase in cough, increase in sputum production, increase in shortness of breath or activity intolerance. If you notice these symptoms, please call to be seen.   Please contact office for sooner follow up if symptoms do not improve or worsen or seek emergency care  We will walk you today to see if you qualify for oxygen

## 2020-05-02 NOTE — Addendum Note (Signed)
Addended byCoralie Keens on: 05/02/2020 05:20 PM   Modules accepted: Orders

## 2020-05-02 NOTE — Progress Notes (Addendum)
History of Present Illness James [name] is a 75 y.o. 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 He is also followed for a  pulmonary nodule.He is followed by Dr. Halford [name].   05/02/2020 Follow up Pt. Presents for 2 week follow up after an acute visit 04/09/2020. He had ot been seen in the office since 05/2018.He has had Covid vaccines, and had tested negative ( Home Test ) at the time. CXR on 2/14 showed emphysema, no infiltrate of edema. He was treated for a COPD flare with Doxycycline, prednisone taper, and started a therapeutic trial with Breztri. Additionally he will need CT Chest without contrast  once he has recovered from current flare , as he is overdue for this  since 07/2018 . This is for surveillance of a RLL lung mass.   Pt. Presents today. He states he did feel better while on the prednisone, but then returns to feeling worse as soon as it is finished. He completed both the prednisone and the Doxycycline. He liked the [employer]. Feels this has helped him some. Would like to continue taking this. He is using his rescue inhaler several times a week, but there are come days he does not need it. He is getting iron transfusions for his fatigue. He states these are not helping.  PFT's reviewed. He had taken Breztri this morning. PFT's are slightly worse with the Breztri than the ones done in 2017. He is scheduled for CT Chest and we will get an echo. He is compliant with Breztri daily, and he is using his rescue inhaler.   PFT's 05/02/2020            Test Results: Spirometry 06/08/12 >> FEV1 1.91 (49%), FEV1% 59 ONO with RA 06/16/12 >>Test time 9 hrs 21 min. Mean SpO2 89%, low SpO2 63%. Spent 2 hrs 25 min with SpO2 <88% 07/07/12 >> SpO2 86% on room air with exertion >> start 2 liters oxygen with exertion and  sleep A1AT 07/07/12 >> 154, PI-MM   Chest Imaging CT chest 03/23/13 >> mild/diffuse centrilobular emphysema, 4 mm nodule RLL superior segment, 4 mm nodule LLL CT chest 02/20/14 >> moderate centrilobular/paraseptal emphysema, mild diffuse bronchial wall thickening, 3 mm nodule RLL and 5 mm nodule LLL no change CT chest 02/13/15 >> new 1.5 cm nodule RLL. CT chest 05/28/15 >> increased RLL irregular nodule to 2 cm PET scan 06/14/15 >> 1.93 SUV RLL 11 mm lesion CT chest 11/13/15 >> no progression CT chest 05/28/16 >> no change in lung nodule CT chest 06/01/17 >> no change CT Chest 07/2018>>Stable exam. No change in the appearance of posterior right lower lobe lung nodules measuring up to 1.3 cm. Stable 24 months. CAD CXR 04/09/2020>> Underlying emphysematous change. Scarring left base. No edema or airspace opacity. Stable cardiac silhouette. There is aortic atherosclerosis.  Sleep tests: PSG 01/25/08 >>RDI 6.8, SpO2 low 84%. Spent 79% of test time with SpO2 <90%  CBC Latest Ref Rng & Units 04/17/2020 10/14/2019 06/20/2019  WBC 4.0 - 10.5 K/uL 9.1 6.9 5.8  Hemoglobin 13.0 - 17.0 g/dL 11.1(L) 13.1 15.4  Hematocrit 39.0 - 52.0 % 37.4(L) 41.7 47.5  Platelets 150 - 400 K/uL 308 278 215    BMP Latest Ref Rng & Units 03/18/2013 02/08/2008 11/24/2006  Glucose 70 - 99 mg/dL 130(H) 92 106(H)  BUN  6 - 23 mg/dL 14 8 10   Creatinine 0.4 - 1.5 mg/dL 1.1 0.92 0.92  Sodium 135 - 145 mEq/L 141 143 141  Potassium 3.5 - 5.1 mEq/L 3.9 4.1 4.8  Chloride 96 - 112 mEq/L 102 107 106  CO2 19 - 32 mEq/L 33(H) 31 29  Calcium 8.4 - 10.5 mg/dL 9.4 9.0 9.3    BNP No results found for: BNP  ProBNP No results found for: PROBNP  PFT 05/02/2020      Component Value Date/Time   FEV1PRE 1.84 05/02/2020 1253   FEV1POST 2.04 05/02/2020 1253   FVCPRE 3.51 05/02/2020 1253   FVCPOST 3.72 05/02/2020 1253   TLC 7.84 05/02/2020 1253   DLCOUNC 11.54 05/02/2020 1253   PREFEV1FVCRT 52 05/02/2020 1253   PSTFEV1FVCRT 55  05/02/2020 1253    DG Chest 2 View  Result Date: 04/09/2020 CLINICAL DATA:  Shortness of breath EXAM: CHEST - 2 VIEW COMPARISON:  Chest radiograph June 09, 2022; chest CT August 11, 2018 FINDINGS: There is underlying emphysematous change, better delineated on prior CT. There is scarring in the left base. There is no edema or airspace opacity. Heart size and pulmonary vascularity are within normal limits. There is aortic atherosclerosis. No bone lesions. IMPRESSION: Underlying emphysematous change. Scarring left base. No edema or airspace opacity. Stable cardiac silhouette. There is aortic atherosclerosis. Aortic Atherosclerosis (ICD10-I70.0) and Emphysema (ICD10-J43.9). Electronically Signed   By: Lowella Grip III M.D.   On: 04/09/2020 10:44     Past medical hx Past Medical History:  Diagnosis Date  . Allergy    seasonal  . Blind left eye    legally  . Blood transfusion    2002  . COPD (chronic obstructive pulmonary disease) ([place])   . Depression   . Diabetes mellitus, type 2 ([place])   . Diverticulosis   . ED (erectile dysfunction)   . Glaucoma   . Hyperlipidemia   . Hypertension   . Insomnia   . Internal and external bleeding hemorrhoids   . Iron deficiency anemia   . OSA (obstructive sleep apnea)   . Osteoarthritis   . Rosacea   . Tremor of both hands   . Tubular adenoma of colon 03/2011     Social History   Tobacco Use  . Smoking status: Current Some Day Smoker    Packs/day: 1.50    Years: 50.00    Pack years: 75.00    Types: Cigarettes    Last attempt to quit: 04/29/2013    Years since quitting: 7.0  . Smokeless tobacco: Never Used  . Tobacco comment: Stateshe sometimes sneaks a cigarette  Substance Use Topics  . Alcohol use: Yes    Alcohol/week: 0.0 standard drinks    Comment: once yearly  . Drug use: No    [name] reports that he has been smoking cigarettes. He has a 75.00 pack-year smoking history. He has never used smokeless tobacco. He reports current  alcohol use. He reports that he does not use drugs.  Tobacco Cessation: Current some day smoker with a 75 pack year smoking history  Past surgical hx, Family hx, Social hx all reviewed.  Current Outpatient Medications on File Prior to Visit  Medication Sig  . albuterol (VENTOLIN HFA) 108 (90 Base) MCG/ACT inhaler Inhale 1-2 puffs into the lungs every 6 (six) hours as needed for wheezing or shortness of breath.  Marland Kitchen atorvastatin (LIPITOR) 20 MG tablet Take 20 mg by mouth at bedtime.   . Budeson-Glycopyrrol-Formoterol (BREZTRI AEROSPHERE) 160-9-4.8 MCG/ACT AERO  Inhale 2 puffs into the lungs in the morning and at bedtime.  Marland Kitchen buPROPion (WELLBUTRIN XL) 300 MG 24 hr tablet Take 300 mg by mouth daily.  . calcium-vitamin D (OSCAL WITH D) 250-125 MG-UNIT per tablet Take 1 tablet by mouth daily.  . carbidopa-levodopa (SINEMET IR) 25-100 MG tablet Take 1.5 tablets by mouth 3 (three) times daily.  . Carbidopa-Levodopa ER (SINEMET CR) 25-100 MG tablet controlled release Take 1 tablet by mouth 2 (two) times daily.  . cetirizine (ZYRTEC) 10 MG tablet Take 10 mg by mouth daily.  Marland Kitchen donepezil (ARICEPT) 10 MG tablet Take 1 tablet (10 mg total) by mouth at bedtime.  Marland Kitchen doxycycline (VIBRA-TABS) 100 MG tablet Take 1 tablet (100 mg total) by mouth 2 (two) times daily.  . ferrous gluconate (FERGON) 324 MG tablet TAKE 1 TABLET BY MOUTH DAILY WITH BREAKFAST  . ferrous sulfate 325 (65 FE) MG tablet Take 325 mg by mouth 2 (two) times daily with a meal.   . Green Tea 315 MG CAPS Take 315 mg by mouth daily.  . hydrochlorothiazide (HYDRODIURIL) 25 MG tablet Take 25 mg by mouth daily.  Marland Kitchen MEGARED OMEGA-3 KRILL OIL PO Take 1 capsule by mouth daily.  . metFORMIN (GLUCOPHAGE) 500 MG tablet Take 1,000 mg by mouth 2 (two) times daily with a meal.   . Multiple Vitamins-Minerals (MULTIVITAMIN WITH MINERALS) tablet Take 1 tablet by mouth daily.  Marland Kitchen omeprazole (PRILOSEC OTC) 20 MG tablet Take 20 mg by mouth daily with breakfast.  .  predniSONE (DELTASONE) 10 MG tablet Take 4 tabs x 2 days, then 3 x 2d, then 2 x 2d, then 1 x 2d and STOP  . sertraline (ZOLOFT) 100 MG tablet Take 150 mg by mouth daily.  . vardenafil (LEVITRA) 20 MG tablet Take 20 mg by mouth daily as needed for erectile dysfunction.    No current facility-administered medications on file prior to visit.     Allergies  Allergen Reactions  . Aspirin Other (See Comments)  . Codeine Itching  . Morphine Itching    Review Of Systems:  Constitutional:   No  weight loss, night sweats,  Fevers, chills, + fatigue, or  lassitude.  HEENT:   No headaches,  Difficulty swallowing,  Tooth/dental problems, or  Sore throat,                No sneezing, itching, ear ache, nasal congestion, post nasal drip,   CV:  No chest pain,  Orthopnea, PND, swelling in lower extremities, anasarca, dizziness, palpitations, syncope.   GI  No heartburn, indigestion, abdominal pain, nausea, vomiting, diarrhea, change in bowel habits, loss of appetite, bloody stools.   Resp: + shortness of breath with exertion or at rest.  + excess mucus, + productive cough,  No non-productive cough,  No coughing up of blood.  No change in color of mucus.  + wheezing.  No chest wall deformity  Skin: no rash or lesions.  GU: no dysuria, change in color of urine, no urgency or frequency.  No flank pain, no hematuria   MS:  No joint pain or swelling.  No decreased range of motion.  No back pain.  Psych:  No change in mood or affect. No depression or anxiety.  No memory loss.   Vital Signs BP 124/62 (BP Location: Right Arm, Cuff Size: Normal)   Pulse 80   Temp 97.7 F (36.5 C) (Oral)   Ht 6\' 3"  (1.905 m)   Wt 243 lb 3.2 oz (110.3  kg)   SpO2 93%   BMI 30.40 kg/m    Physical Exam:  General- No distress,  A&Ox3, appropriate ENT: No sinus tenderness, TM clear, pale nasal mucosa, no oral exudate,no post nasal drip, no LAN Cardiac: S1, S2, regular rate and rhythm, no murmur Chest: + wheeze/  rales/ dullness; no accessory muscle use, no nasal flaring, no sternal retractions, diminished per bases, + rhonchi Abd.: Soft Non-tender, ND, BS +, Body mass index is 30.4 kg/m. Ext: No clubbing cyanosis, edema Neuro:  normal strength, MAE x 4, A&O x 3 Skin: No rashes, warm and dry, no lesions Psych: normal mood and behavior   Assessment/Plan Resolved COPD Flare Plan We will give you additional samples of Breztri and place a prescription. Continue 2 puffs twice dail;y Rinse mouth after use. Let us know if this drug is not covered, and we can consider Trelegy We can give you samples to try before we sent in a prescription. CT Chest 05/10/2020. We will place an order for a 2 D Echo>> (Please assess pulmonary artery pressures) . Continue wearing oxygen at 3 L at bedtime, and as needed.  Continue Zyrtec and Mucinex. Follow up after CT and Echo in 1 month with Dr. Halford [name] or [name]. Note your daily symptoms > remember "red flags" for COPD:  Increase in cough, increase in sputum production, increase in shortness of breath or activity intolerance. If you notice these symptoms, please call to be seen.   Please contact office for sooner follow up if symptoms do not improve or worsen or seek emergency care   Chronic respiratory failure with nocturnal hypoxemia from COPD. - continue 3 liters oxygen at night, and as needed to maintain oxygen sats 88-92% - Wear oxygen as needed to maintain sats 88-92% - Will need ambulatory oximetry to see if he needs ambulatory oxygen once he has recovered from this flare.   Right lower lung mass. Stable x 2 years in 2020 Current some day smoker - overdue for  f/u CT w/o contrast forJune2020 - Will need to schedule once over current flare - Counseled on smoking cessation.   Upper airway cough syndrome from rhinitis and post-nasal drip. - prn OTC antihistamine  Note your daily symptoms >remember "red flags" for COPD: Increase in cough, increase in  sputum production, increase in shortness of breath or activity intolerance. If you notice these symptoms, please call to be seen.  Magdalen Spatz, NP 05/02/2020  2:29 PM

## 2020-05-02 NOTE — Progress Notes (Signed)
Reviewed and agree with assessment/plan.   Chesley Mires, MD [facility] Pulmonary/Critical Care 05/02/2020, 3:19 PM Pager:  [phone]

## 2020-05-02 NOTE — Progress Notes (Signed)
PFT done today. 

## 2020-05-02 NOTE — Addendum Note (Signed)
Addended byCoralie Keens on: 05/02/2020 02:59 PM   Modules accepted: Orders

## 2020-05-02 NOTE — Progress Notes (Signed)
 [facility] Progress Note   James [name] [id number] 07/28/1945 75 y.o.  Dartanion James Phagan is managed by Dr. Vinay Gudena  Actively treated with chemotherapy/immunotherapy/hormonal therapy:  Mr. Ewy receives IV Venofer for iron deficiency anemia.  Current therapy: Venofer  Next scheduled appointment with provider: 10/23/2020  Assessment: Plan:    Iron deficiency anemia due to chronic blood loss   Iron deficiency anemia secondary to chronic GI blood loss: Mr. Smelser was seen in the infusion room today as he was receiving IV Venofer.  He reports that his energy level continues to be low despite his receipt of IV iron.  He is scheduled to return for additional IV iron only and will be seen in follow-up by Dr. Vinay Gudena on 10/23/2020.  Please see After Visit Summary for patient specific instructions.  Future Appointments  Date Time Provider Department Center  05/04/2020  2:00 PM [facility] INFUSION [facility] None  05/10/2020 11:40 AM [facility] 1 [facility]  06/04/2020 11:00 AM Groce, Sarah F, NP [facility] None  10/22/2020 12:00 PM [facility] LAB [facility] None  10/23/2020  2:00 PM Gudena, Vinay, MD [facility] None  10/23/2020  3:00 PM [facility] INFUSION [facility] None    No orders of the defined types were placed in this encounter.      Subjective:   Patient ID:  James [name] is a 75 y.o. (DOB 08/07/1945) male.  Chief Complaint: No chief complaint on file.   HPI Hester James [name]  is a 75 y.o. male with a diagnosis of an iron deficiency anemia secondary to chronic GI blood loss.  He was seen in the infusion room today as he was receiving repletion of iron with IV Venofer.  He reports that his energy level continues to be low.  He does not notice any significant change after repletion with IV iron.  He denies any issues of concern today including melena or bright red blood per rectum.   Medications: I have reviewed the  patient's current medications.  Allergies:  Allergies  Allergen Reactions  . Aspirin Other (See Comments)  . Codeine Itching  . Morphine Itching    Past Medical History:  Diagnosis Date  . Allergy    seasonal  . Blind left eye    legally  . Blood transfusion    2002  . COPD (chronic obstructive pulmonary disease) (HCC)   . Depression   . Diabetes mellitus, type 2 (HCC)   . Diverticulosis   . ED (erectile dysfunction)   . Glaucoma   . Hyperlipidemia   . Hypertension   . Insomnia   . Internal and external bleeding hemorrhoids   . Iron deficiency anemia   . OSA (obstructive sleep apnea)   . Osteoarthritis   . Rosacea   . Tremor of both hands   . Tubular adenoma of colon 03/2011    Past Surgical History:  Procedure Laterality Date  . ARTHROSCOPIC REPAIR ACL    . CHOLECYSTECTOMY    . CLAVICLE SURGERY  1993 & 1994   right  . COLONOSCOPY WITH PROPOFOL N/A 07/25/2015   Procedure: COLONOSCOPY WITH PROPOFOL;  Surgeon: Malcolm T Stark, MD;  Location: [facility];  Service: Endoscopy;  Laterality: N/A;  . ESOPHAGOGASTRODUODENOSCOPY (EGD) WITH PROPOFOL N/A 07/25/2015   Procedure: ESOPHAGOGASTRODUODENOSCOPY (EGD) WITH PROPOFOL;  Surgeon: Malcolm T Stark, MD;  Location: [facility];  Service: Endoscopy;  Laterality: N/A;  . KNEE ARTHROSCOPY  1996  . PARTIAL COLECTOMY  2008  . THYROID CYST EXCISION    .   TONSILLECTOMY      Family History  Problem Relation Age of Onset  . CAD Father   . Alcohol abuse Father   . Heart disease Father   . COPD Mother   . Asthma Mother   . Emphysema Mother   . Cancer Maternal Grandmother   . Colon cancer Neg Hx     Social History   Socioeconomic History  . Marital status: Married    Spouse name: Not on file  . Number of children: 1  . Years of education: HS  . Highest education level: Not on file  Occupational History  . Occupation: retired    Employer: RETIRED  Tobacco Use  . Smoking status: Current Some Day Smoker    Packs/day:  1.50    Years: 50.00    Pack years: 75.00    Types: Cigarettes    Last attempt to quit: 04/29/2013    Years since quitting: 7.0  . Smokeless tobacco: Never Used  . Tobacco comment: Stateshe sometimes sneaks a cigarette  Substance and Sexual Activity  . Alcohol use: Yes    Alcohol/week: 0.0 standard drinks    Comment: once yearly  . Drug use: No  . Sexual activity: Not on file  Other Topics Concern  . Not on file  Social History Narrative   Drinks 1-2 [employer] a day    Social Determinants of Health   Financial Resource Strain: Not on file  Food Insecurity: Not on file  Transportation Needs: Not on file  Physical Activity: Not on file  Stress: Not on file  Social Connections: Not on file  Intimate Partner Violence: Not on file    Past Medical History, Surgical history, Social history, and Family history were reviewed and updated as appropriate.   Please see review of systems for further details on the patient's review from today.   Review of Systems:  Review of Systems  Constitutional: Positive for fatigue. Negative for chills, diaphoresis and fever.  HENT: Negative for trouble swallowing and voice change.   Respiratory: Negative for cough, chest tightness, shortness of breath and wheezing.   Cardiovascular: Negative for chest pain and palpitations.  Gastrointestinal: Negative for abdominal pain, constipation, diarrhea, nausea and vomiting.  Musculoskeletal: Negative for back pain and myalgias.  Neurological: Negative for dizziness, light-headedness and headaches.    Objective:   Physical Exam:  There were no vitals taken for this visit. ECOG: 0  Physical Exam Constitutional:      General: He is not in acute distress.    Appearance: He is not diaphoretic.  HENT:     Head: Normocephalic and atraumatic.  Eyes:     General: No scleral icterus.       Right eye: No discharge.        Left eye: No discharge.     Conjunctiva/sclera: Conjunctivae normal.  Cardiovascular:      Rate and Rhythm: Normal rate and regular rhythm.     Heart sounds: Normal heart sounds. No murmur heard. No friction rub. No gallop.   Pulmonary:     Effort: Pulmonary effort is normal. No respiratory distress.     Breath sounds: Normal breath sounds. No wheezing or rales.  Skin:    General: Skin is warm and dry.     Findings: No erythema or rash.  Neurological:     Mental Status: He is alert.  Psychiatric:        Mood and Affect: Mood normal.        Behavior: Behavior   normal.        Thought Content: Thought content normal.        Judgment: Judgment normal.     Lab Review:     Component Value Date/Time   NA 141 03/18/2013 1422   K 3.9 03/18/2013 1422   CL 102 03/18/2013 1422   CO2 33 (H) 03/18/2013 1422   GLUCOSE 130 (H) 03/18/2013 1422   BUN 14 03/18/2013 1422   CREATININE 1.1 03/18/2013 1422   CALCIUM 9.4 03/18/2013 1422   GFRNONAA >60 02/08/2008 1431   GFRAA  02/08/2008 1431    >60        The eGFR has been calculated using the MDRD equation. This calculation has not been validated in all clinical       Component Value Date/Time   WBC 9.1 04/17/2020 0939   WBC 7.4 02/08/2008 1431   RBC 4.83 04/17/2020 0939   HGB 11.1 (L) 04/17/2020 0939   HCT 37.4 (L) 04/17/2020 0939   PLT 308 04/17/2020 0939   MCV 77.4 (L) 04/17/2020 0939   MCH 23.0 (L) 04/17/2020 0939   MCHC 29.7 (L) 04/17/2020 0939   RDW 15.9 (H) 04/17/2020 0939   LYMPHSABS 1.9 04/17/2020 0939   MONOABS 1.0 04/17/2020 0939   EOSABS 0.1 04/17/2020 0939   BASOSABS 0.1 04/17/2020 0939   -------------------------------  Imaging from last 24 hours (if applicable):  Radiology interpretation: DG Chest 2 View  Result Date: 04/09/2020 CLINICAL DATA:  Shortness of breath EXAM: CHEST - 2 VIEW COMPARISON:  Chest radiograph June 09, 2022; chest CT August 11, 2018 FINDINGS: There is underlying emphysematous change, better delineated on prior CT. There is scarring in the left base. There is no edema or  airspace opacity. Heart size and pulmonary vascularity are within normal limits. There is aortic atherosclerosis. No bone lesions. IMPRESSION: Underlying emphysematous change. Scarring left base. No edema or airspace opacity. Stable cardiac silhouette. There is aortic atherosclerosis. Aortic Atherosclerosis (ICD10-I70.0) and Emphysema (ICD10-J43.9). Electronically Signed   By: Lowella Grip III M.D.   On: 04/09/2020 10:44

## 2020-05-03 ENCOUNTER — Telehealth: Payer: Self-pay | Admitting: Acute Care

## 2020-05-03 ENCOUNTER — Ambulatory Visit: Payer: Medicare Other

## 2020-05-03 NOTE — Telephone Encounter (Signed)
[address], [place], [place]; [place], [place]; [place], [place]; [place], [place] pt is already on O2 with conserving device. we are unable to provide pt with POC

## 2020-05-04 ENCOUNTER — Other Ambulatory Visit: Payer: Self-pay

## 2020-05-04 ENCOUNTER — Telehealth: Payer: Self-pay | Admitting: Acute Care

## 2020-05-04 ENCOUNTER — Inpatient Hospital Stay: Payer: Medicare Other

## 2020-05-04 VITALS — BP 157/78 | HR 75 | Temp 98.4°F | Resp 18 | Wt 241.4 lb

## 2020-05-04 DIAGNOSIS — D509 Iron deficiency anemia, unspecified: Secondary | ICD-10-CM

## 2020-05-04 DIAGNOSIS — J441 Chronic obstructive pulmonary disease with (acute) exacerbation: Secondary | ICD-10-CM

## 2020-05-04 DIAGNOSIS — K922 Gastrointestinal hemorrhage, unspecified: Secondary | ICD-10-CM | POA: Diagnosis not present

## 2020-05-04 DIAGNOSIS — D500 Iron deficiency anemia secondary to blood loss (chronic): Secondary | ICD-10-CM | POA: Diagnosis not present

## 2020-05-04 MED ORDER — SODIUM CHLORIDE 0.9 % IV SOLN
300.0000 mg | Freq: Once | INTRAVENOUS | Status: AC
  Administered 2020-05-04: 300 mg via INTRAVENOUS
  Filled 2020-05-04: qty 10

## 2020-05-04 MED ORDER — SODIUM CHLORIDE 0.9 % IV SOLN
Freq: Once | INTRAVENOUS | Status: AC
  Filled 2020-05-04: qty 250

## 2020-05-04 NOTE — Telephone Encounter (Signed)
ATC patient unable to reach LM to call back office (x1)  

## 2020-05-04 NOTE — Progress Notes (Signed)
Pt does not want to stay 9minute obs, VSS, pt stable upon discharge

## 2020-05-04 NOTE — Patient Instructions (Signed)

## 2020-05-04 NOTE — Telephone Encounter (Signed)
ATC patient unable to reach LM to call back office (x1)  Sarah please advise which strength of Trelegy

## 2020-05-04 NOTE — Telephone Encounter (Signed)
Called and spoke with patient's wife James [name]. She confirmed that the patient does not have a POC and only has the small tanks that only last for an hour. She stated that he has been with [facility] for more than 5 years.   Called [facility] and spoke with Magda Paganini. She stated that their company does not transition patients from continuous O2 to pulse O2 and they do not have any POCs in stock.   Called patient's wife back but she did not answer. Left message for her to call us back.

## 2020-05-07 ENCOUNTER — Telehealth: Payer: Self-pay | Admitting: Neurology

## 2020-05-07 MED ORDER — ALBUTEROL SULFATE HFA 108 (90 BASE) MCG/ACT IN AERS: 1.0000 | INHALATION_SPRAY | Freq: Four times a day (QID) | RESPIRATORY_TRACT | 6 refills | Status: DC | PRN

## 2020-05-07 NOTE — Telephone Encounter (Signed)
Patient wife returned call, confirmed patient DOB. Confirmed medication and pharmacy. Made aware refill has been sent in. Made aware we are waiting to confirm Trelegy dose and once confirmed we will get samples ready. Aware we will call them once the samples are ready.

## 2020-05-07 NOTE — Telephone Encounter (Signed)
Pt. is scheduled to see Dr. tomorrow for a follow up.

## 2020-05-07 NOTE — Telephone Encounter (Signed)
Noted  

## 2020-05-08 ENCOUNTER — Ambulatory Visit (INDEPENDENT_AMBULATORY_CARE_PROVIDER_SITE_OTHER): Payer: Medicare Other | Admitting: Neurology

## 2020-05-08 ENCOUNTER — Encounter: Payer: Self-pay | Admitting: Neurology

## 2020-05-08 ENCOUNTER — Other Ambulatory Visit: Payer: Self-pay

## 2020-05-08 VITALS — BP 130/72 | HR 89 | Ht 73.0 in | Wt 237.3 lb

## 2020-05-08 DIAGNOSIS — Z9181 History of falling: Secondary | ICD-10-CM | POA: Diagnosis not present

## 2020-05-08 DIAGNOSIS — G20 Parkinson's disease: Secondary | ICD-10-CM | POA: Diagnosis not present

## 2020-05-08 DIAGNOSIS — R413 Other amnesia: Secondary | ICD-10-CM | POA: Diagnosis not present

## 2020-05-08 MED ORDER — CARBIDOPA-LEVODOPA ER 25-100 MG PO TBCR: 1.0000 | EXTENDED_RELEASE_TABLET | Freq: Two times a day (BID) | ORAL | 3 refills | Status: DC

## 2020-05-08 MED ORDER — CARBIDOPA-LEVODOPA 25-100 MG PO TABS: 1.5000 | ORAL_TABLET | Freq: Three times a day (TID) | ORAL | 3 refills | Status: DC

## 2020-05-08 MED ORDER — TRELEGY ELLIPTA 100-62.5-25 MCG/INH IN AEPB: 1.0000 | INHALATION_SPRAY | Freq: Every day | RESPIRATORY_TRACT | 5 refills | Status: DC

## 2020-05-08 MED ORDER — DONEPEZIL HCL 10 MG PO TABS: 10.0000 mg | ORAL_TABLET | Freq: Every day | ORAL | 3 refills | Status: AC

## 2020-05-08 NOTE — Telephone Encounter (Signed)
Please order the 100 dose. Thanks

## 2020-05-08 NOTE — Telephone Encounter (Signed)
James [name], please advise which strength of Trelegy. Thanks.
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
## 2020-05-08 NOTE — Telephone Encounter (Signed)
lmtcb for pt.  

## 2020-05-08 NOTE — Patient Instructions (Signed)
It was good to see you both again today.  We will continue with your medications, I have renewed your prescriptions for carbidopa-levodopa immediate release and long-acting as well as donepezil for your memory.  Please take your immediate release carbidopa-levodopa 3 times a day, 1-1/2 pills at noon, 1-1/2 pills at 5 PM and 1-1/2 pills at midnight daily.  Please take your long-acting carbidopa-levodopa twice daily at 10 AM and 2 AM daily.  Try not to skip any meals, use your cane at all times for gait safety.  Try to hydrate well with water.  Please try to exercise within your limitations.  Follow-up in 6 months to see Debbora Presto, NP routinely.

## 2020-05-08 NOTE — Addendum Note (Signed)
Addended by: Lorretta Harp on: 05/08/2020 04:50 PM   Modules accepted: Orders

## 2020-05-08 NOTE — Telephone Encounter (Signed)
Called and spoke with pt's wife [name] letting her know that we were going to send Rx for Trelegy to pharmacy for pt. Lucy verbalized understanding. Verified preferred pharmacy and sent Rx in for pt. Nothing further needed.

## 2020-05-09 ENCOUNTER — Encounter (HOSPITAL_COMMUNITY): Payer: Self-pay | Admitting: Acute Care

## 2020-05-09 NOTE — Telephone Encounter (Signed)
Closing encounter per protocol

## 2020-05-10 ENCOUNTER — Inpatient Hospital Stay: Admission: RE | Admit: 2020-05-10 | Payer: Medicare Other | Source: Ambulatory Visit

## 2020-05-10 ENCOUNTER — Ambulatory Visit
Admission: RE | Admit: 2020-05-10 | Discharge: 2020-05-10 | Disposition: A | Discharge status: Home / Self Care | Payer: Medicare Other | Source: Ambulatory Visit | Attending: Acute Care | Admitting: Acute Care

## 2020-05-10 ENCOUNTER — Other Ambulatory Visit: Payer: Self-pay

## 2020-05-10 DIAGNOSIS — R0602 Shortness of breath: Secondary | ICD-10-CM

## 2020-05-10 DIAGNOSIS — R918 Other nonspecific abnormal finding of lung field: Secondary | ICD-10-CM | POA: Diagnosis not present

## 2020-05-10 DIAGNOSIS — J441 Chronic obstructive pulmonary disease with (acute) exacerbation: Secondary | ICD-10-CM

## 2020-05-10 DIAGNOSIS — J439 Emphysema, unspecified: Secondary | ICD-10-CM

## 2020-05-10 IMAGING — CT CT CHEST W/O CM
1 series · 15 of 34 positions shown, 19 images · non-contrast
Comparison: [DATE], [DATE]

CLINICAL DATA: Emphysema, shortness of breath, follow-up pulmonary
nodule

EXAM:
CT CHEST WITHOUT CONTRAST
TECHNIQUE: Multidetector CT imaging of the chest was performed following the
standard protocol without IV contrast.

[Series 2: chest w/(date) · axial · 0.83mm/px · z∈[-287,-11]mm · 15 of 164 slices shown, 19 images]
[im 13/164  mediastinal]
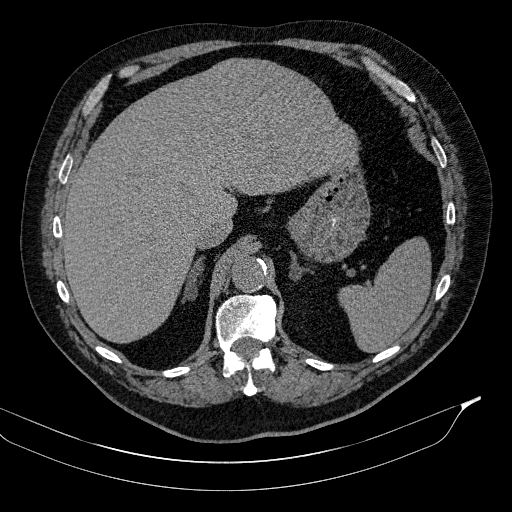
[im 13/164  lung]
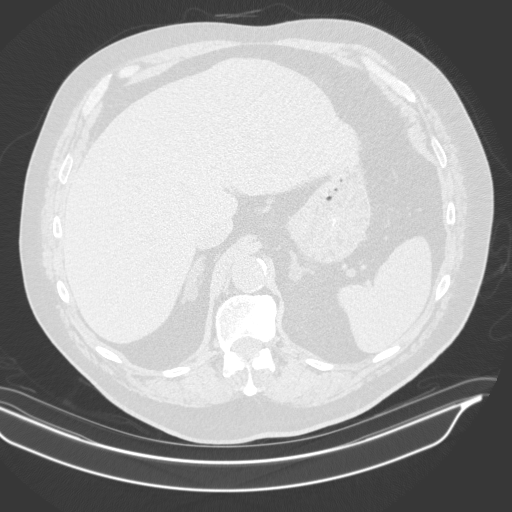
[im 25/164  lung]
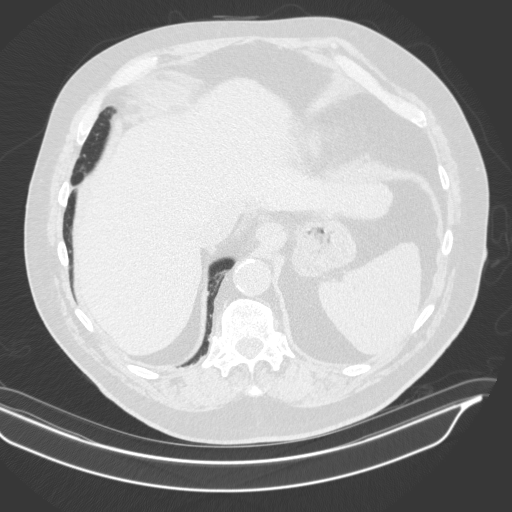
[im 33/164  lung]
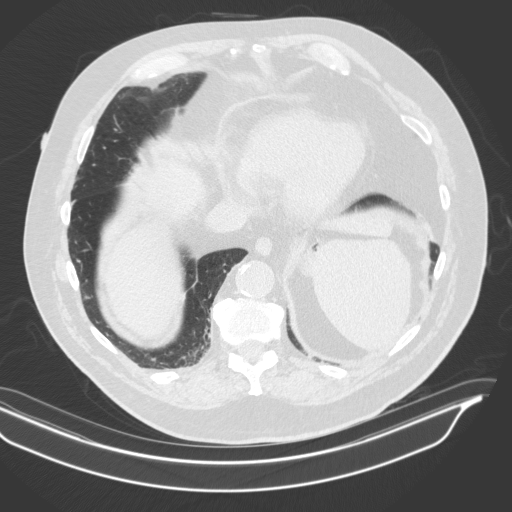
[im 43/164  lung]
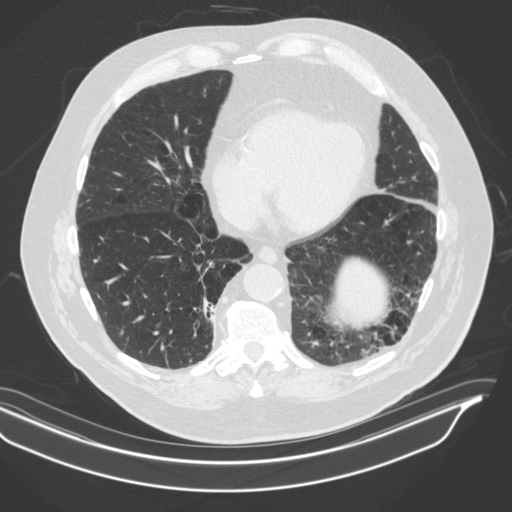
[im 55/164  mediastinal]
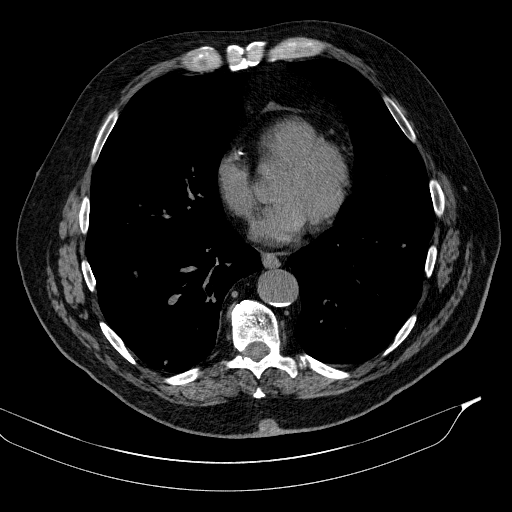
[im 55/164  lung]
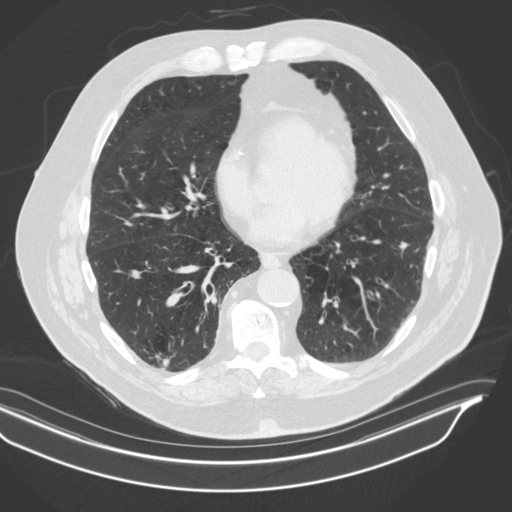
[im 66/164  lung]
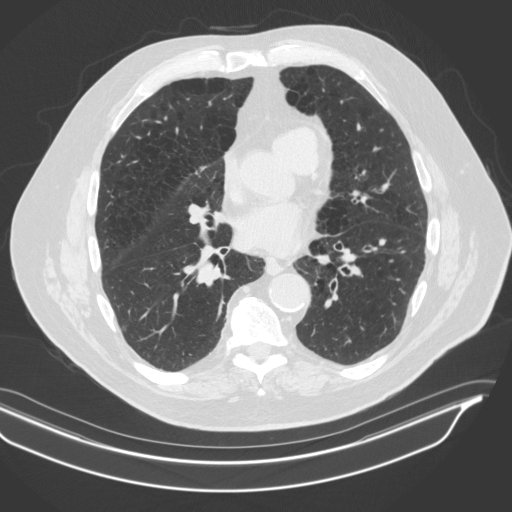
[im 73/164  lung]
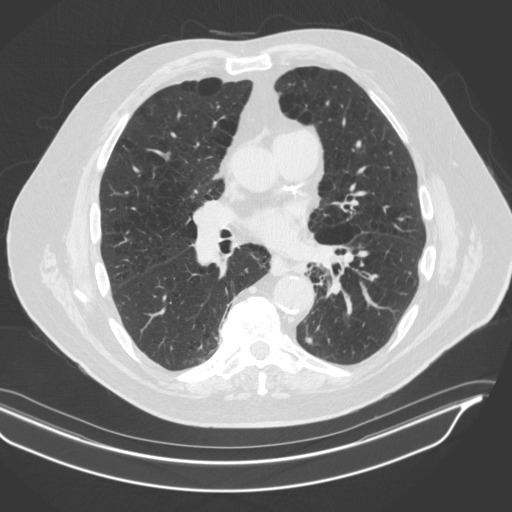
[im 85/164  lung]
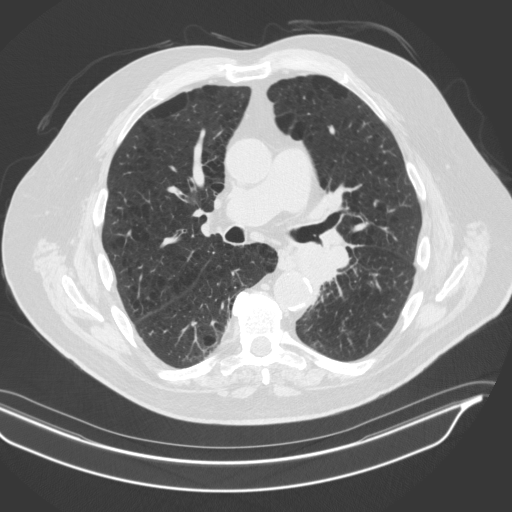
[im 91/164  mediastinal]
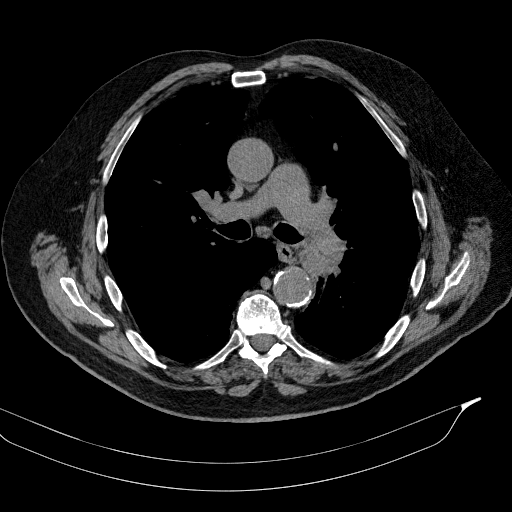
[im 91/164  lung]
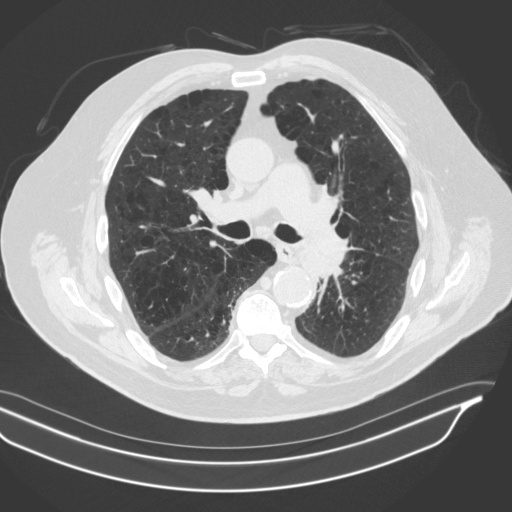
[im 98/164  lung]
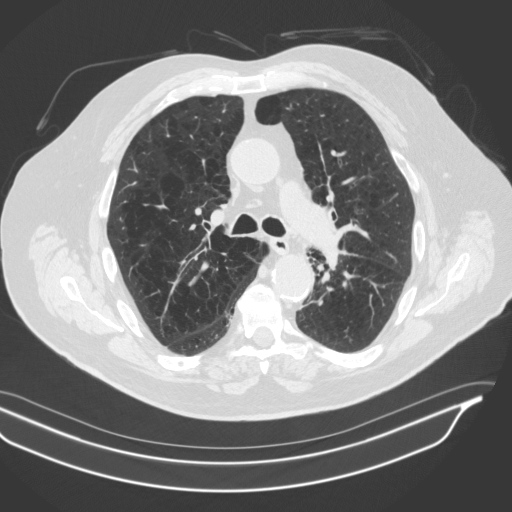
[im 109/164  lung]
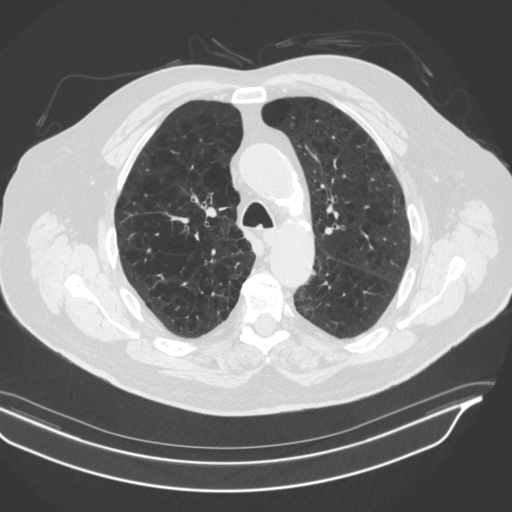
[im 121/164  lung]
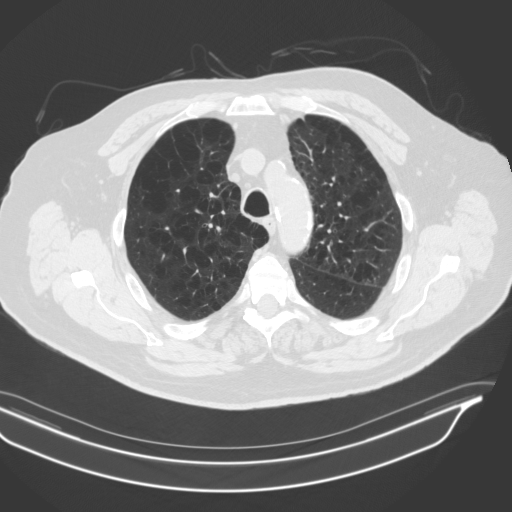
[im 131/164  mediastinal]
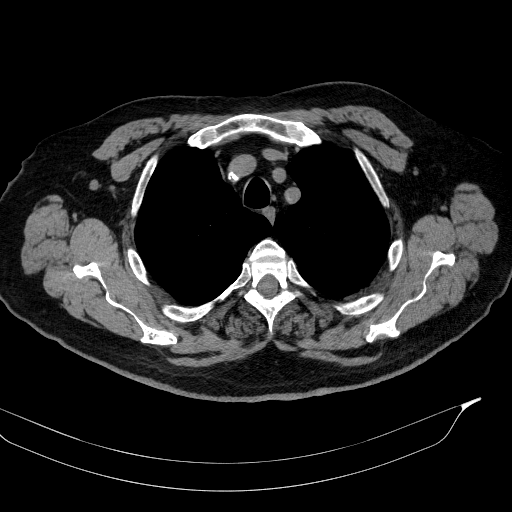
[im 131/164  lung]
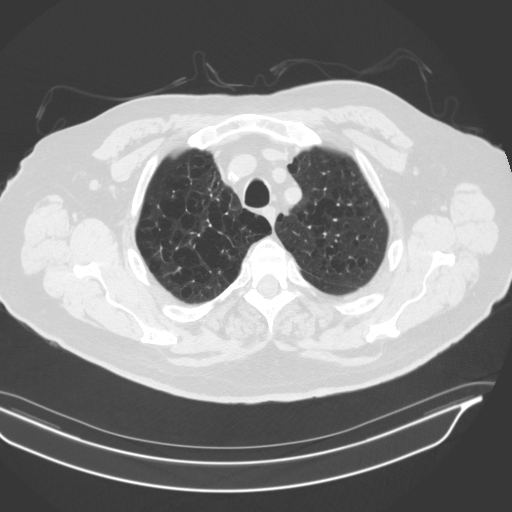
[im 139/164  lung]
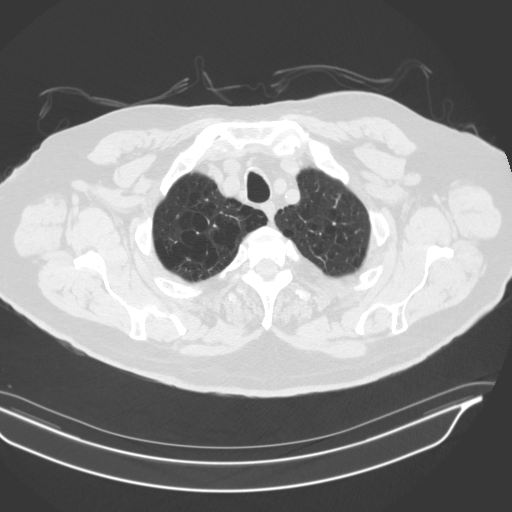
[im 151/164  lung]
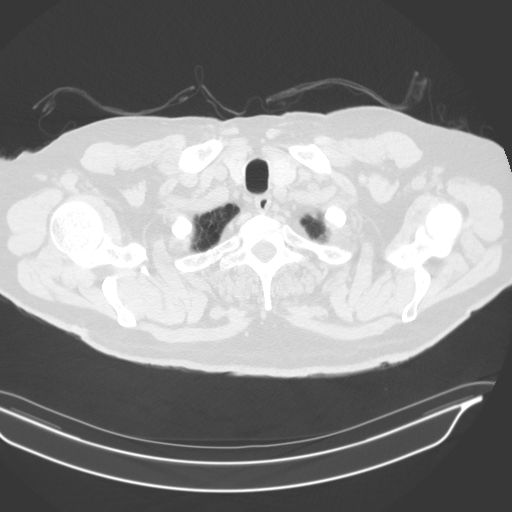

[15 of 34 positions shown; findings below may reference images not displayed]

FINDINGS: Cardiovascular: Aortic atherosclerosis. Normal heart size.
Three-vessel coronary artery calcifications. No pericardial
effusion.

Mediastinum/Nodes: No enlarged mediastinal, hilar, or axillary lymph
nodes. Thyroid gland, trachea, and esophagus demonstrate no
significant findings.

Lungs/Pleura: Moderate to severe centrilobular emphysema. Diffuse
bilateral bronchial wall thickening. Unchanged nodule of the
dependent right lung base measuring 1.2 x 0.7 cm. Unchanged 4 mm
nodule of the medial superior segment left lower lobe (series 5,
image 91). Unchanged 3 mm nodule of the superior segment right lower
lobe (series 5, image 82). No pleural effusion or pneumothorax.

Upper Abdomen: No acute abnormality. Benign, fatty attenuation
adenomatous thickening of the bilateral adrenal glands.

Musculoskeletal: No chest wall mass or suspicious bone lesions
identified.
IMPRESSION: 1. Stable, definitively benign pulmonary nodules. No further
follow-up or characterization is required.
2. Moderate to severe centrilobular emphysema.
3. Diffuse bilateral bronchial wall thickening, consistent with
nonspecific infectious or inflammatory bronchitis.
4. Coronary artery disease.

Aortic Atherosclerosis ([KK]-[KK]) and Emphysema ([KK]-[KK]).

## 2020-05-17 NOTE — Progress Notes (Signed)
Please call patient and let him know his CT Chest shows a stable nodule.This is good news Dr. Halford Chessman, do you feel we need to continue monitoring this nodule? Per radiology , 2 years stability and definitely benign. Let me know.   Thanks so much

## 2020-06-04 ENCOUNTER — Encounter: Payer: Self-pay | Admitting: Acute Care

## 2020-06-04 ENCOUNTER — Other Ambulatory Visit: Payer: Self-pay

## 2020-06-04 ENCOUNTER — Ambulatory Visit (INDEPENDENT_AMBULATORY_CARE_PROVIDER_SITE_OTHER): Payer: Medicare Other | Admitting: Acute Care

## 2020-06-04 ENCOUNTER — Telehealth: Payer: Self-pay | Admitting: Acute Care

## 2020-06-04 VITALS — BP 142/74 | HR 91 | Temp 97.8°F | Ht 72.0 in | Wt 238.4 lb

## 2020-06-04 DIAGNOSIS — R0902 Hypoxemia: Secondary | ICD-10-CM | POA: Diagnosis not present

## 2020-06-04 DIAGNOSIS — J432 Centrilobular emphysema: Secondary | ICD-10-CM

## 2020-06-04 DIAGNOSIS — R058 Other specified cough: Secondary | ICD-10-CM | POA: Diagnosis not present

## 2020-06-04 MED ORDER — PREDNISONE 10 MG (21) PO TBPK: ORAL_TABLET | Freq: Every day | ORAL | 0 refills | Status: DC

## 2020-06-04 MED ORDER — DOXYCYCLINE HYCLATE 100 MG PO TABS: 100.0000 mg | ORAL_TABLET | Freq: Two times a day (BID) | ORAL | 0 refills | Status: DC

## 2020-06-04 MED ORDER — LEVALBUTEROL HCL 0.31 MG/3ML IN NEBU: 1.0000 | INHALATION_SOLUTION | RESPIRATORY_TRACT | 12 refills | Status: DC | PRN

## 2020-06-04 NOTE — Progress Notes (Signed)
History of Present Illness James [name] is a 75 y.o. male remote smoker ( Quit 2015 with a 75 pack year smoking history, sneaks an occasional cigarette)with COPD/emphysema, chronic hypoxic respiratory failure on supplemental oxygen at night andwithExertion. He is also followed for apulmonary nodule.He is followed by Dr. Halford Chessman.   06/04/2020  Pt.Pt. Presents with acute exacerbation of his COPD. He had a CT Chest 05/10/2020. This shows moderate to severe emphysema and infectious or inflammatory bronchitis. No fibrosis.  No pneumonia. He is currently wheezing audibly today. He does not have his rescue inhaler with him.He is not using his rescue inhaler. He did not come to the office  wearing his oxygen. Secretions are yellow. He feels congested and is not coughing up much despite talking Mucinex. . Cough is non-productive.Marland Kitchen He does not have a flutter valve or an incentive spirometer. He had PFT's done which confirm  Moderate obstruction. Borderline bronchodilator response. Air trapping with lung volume testing. Moderate to severe diffusion defect. He has a heart echo scheduled for 4/13 to determine if there is a PAH component to his dyspnea. I am concerned he has been hypoxic over time, and this is affecting his heart, putting him at risk for PAH.BP is elevated after walking to the exam room. He does not want the oxygen we have in the office as he does not think he needs it. They are having a very difficult time getting the oxygen he needs from his DME. He has been provided with 2 very small tanks that last 1 hour each. We will place orders through another company.  Marland KitchenHe denies  Fever, chest pain or orthopnea.  We had a long discussion about wearing his oxygen with any activity, as he dropped his sats with his walk at the last OV to 83%. I have explained the damage this can do to his heart, and how it can affect his LOC and awareness. He and his wife verbalized understanding.     Test  Results:       Pulmonary tests: Spirometry 06/08/12 >> FEV1 1.91 (49%), FEV1% 59 ONO with RA 06/16/12 >>Test time 9 hrs 21 min. Mean SpO2 89%, low SpO2 63%. Spent 2 hrs 25 min with SpO2 <88% 07/07/12 >> SpO2 86% on room air with exertion >> start 2 liters oxygen with exertion and sleep A1AT 07/07/12 >> 154, PI-MM  Chest imaging CT chest 03/23/13 >> mild/diffuse centrilobular emphysema, 4 mm nodule RLL superior segment, 4 mm nodule LLL CT chest 02/20/14 >> moderate centrilobular/paraseptal emphysema, mild diffuse bronchial wall thickening, 3 mm nodule RLL and 5 mm nodule LLL no change CT chest 02/13/15 >> new 1.5 cm nodule RLL. CT chest 05/28/15 >> increased RLL irregular nodule to 2 cm PET scan 06/14/15 >> 1.93 SUV RLL 11 mm lesion CT chest 11/13/15 >> no progression CT chest 05/28/16 >> no change in lung nodule CT chest 06/01/17 >> no change CT Chest 05/02/2020 >> stable pulmonary nodules  Sleep tests: PSG 01/25/08 >>RDI 6.8, SpO2 low 84%. Spent 79% of test time with SpO2 <90%   CBC Latest Ref Rng & Units 04/17/2020 10/14/2019 06/20/2019  WBC 4.0 - 10.5 K/uL 9.1 6.9 5.8  Hemoglobin 13.0 - 17.0 g/dL 11.1(L) 13.1 15.4  Hematocrit 39.0 - 52.0 % 37.4(L) 41.7 47.5  Platelets 150 - 400 K/uL 308 278 215    BMP Latest Ref Rng & Units 03/18/2013 02/08/2008 11/24/2006  Glucose 70 - 99 mg/dL 130(H) 92 106(H)  BUN 6 - 23 mg/dL 14  8 10  Creatinine 0.4 - 1.5 mg/dL 1.1 0.92 0.92  Sodium 135 - 145 mEq/L 141 143 141  Potassium 3.5 - 5.1 mEq/L 3.9 4.1 4.8  Chloride 96 - 112 mEq/L 102 107 106  CO2 19 - 32 mEq/L 33(H) 31 29  Calcium 8.4 - 10.5 mg/dL 9.4 9.0 9.3    BNP No results found for: BNP  ProBNP No results found for: PROBNP  PFT    Component Value Date/Time   FEV1PRE 1.84 05/02/2020 1253   FEV1POST 2.04 05/02/2020 1253   FVCPRE 3.51 05/02/2020 1253   FVCPOST 3.72 05/02/2020 1253   TLC 7.84 05/02/2020 1253   DLCOUNC 11.54 05/02/2020 1253   PREFEV1FVCRT 52 05/02/2020 1253    PSTFEV1FVCRT 55 05/02/2020 1253    CT Chest Wo Contrast  Result Date: 05/10/2020 CLINICAL DATA:  Emphysema, shortness of breath, follow-up pulmonary nodule EXAM: CT CHEST WITHOUT CONTRAST TECHNIQUE: Multidetector CT imaging of the chest was performed following the standard protocol without IV contrast. COMPARISON:  08/11/2018, 06/01/2017 FINDINGS: Cardiovascular: Aortic atherosclerosis. Normal heart size. Three-vessel coronary artery calcifications. No pericardial effusion. Mediastinum/Nodes: No enlarged mediastinal, hilar, or axillary lymph nodes. Thyroid gland, trachea, and esophagus demonstrate no significant findings. Lungs/Pleura: Moderate to severe centrilobular emphysema. Diffuse bilateral bronchial wall thickening. Unchanged nodule of the dependent right lung base measuring 1.2 x 0.7 cm. Unchanged 4 mm nodule of the medial superior segment left lower lobe (series 5, image 91). Unchanged 3 mm nodule of the superior segment right lower lobe (series 5, image 82). No pleural effusion or pneumothorax. Upper Abdomen: No acute abnormality. Benign, fatty attenuation adenomatous thickening of the bilateral adrenal glands. Musculoskeletal: No chest wall mass or suspicious bone lesions identified. IMPRESSION: 1. Stable, definitively benign pulmonary nodules. No further follow-up or characterization is required. 2. Moderate to severe centrilobular emphysema. 3. Diffuse bilateral bronchial wall thickening, consistent with nonspecific infectious or inflammatory bronchitis. 4. Coronary artery disease. Aortic Atherosclerosis (ICD10-I70.0) and Emphysema (ICD10-J43.9). Electronically Signed   By: Eddie Candle M.D.   On: 05/10/2020 16:05     Past medical hx Past Medical History:  Diagnosis Date  . Allergy    seasonal  . Blind left eye    legally  . Blood transfusion    2002  . COPD (chronic obstructive pulmonary disease) ([place])   . Depression   . Diabetes mellitus, type 2 ([place])   . Diverticulosis   . ED  (erectile dysfunction)   . Glaucoma   . Hyperlipidemia   . Hypertension   . Insomnia   . Internal and external bleeding hemorrhoids   . Iron deficiency anemia   . OSA (obstructive sleep apnea)   . Osteoarthritis   . Rosacea   . Tremor of both hands   . Tubular adenoma of colon 03/2011     Social History   Tobacco Use  . Smoking status: Current Some Day Smoker    Packs/day: 1.50    Years: 50.00    Pack years: 75.00    Types: Cigarettes    Last attempt to quit: 04/29/2013    Years since quitting: 7.1  . Smokeless tobacco: Never Used  . Tobacco comment: Stateshe sometimes sneaks a cigarette  Substance Use Topics  . Alcohol use: Yes    Alcohol/week: 0.0 standard drinks    Comment: once yearly  . Drug use: No    Mr.Clausing reports that he has been smoking cigarettes. He has a 75.00 pack-year smoking history. He has never used smokeless tobacco. He reports current  alcohol use. He reports that he does not use drugs.  Tobacco Cessation: Current some day smoker with a 75 pack year smoking history.  Past surgical hx, Family hx, Social hx all reviewed.  Current Outpatient Medications on File Prior to Visit  Medication Sig  . albuterol (VENTOLIN HFA) 108 (90 Base) MCG/ACT inhaler Inhale 1-2 puffs into the lungs every 6 (six) hours as needed for wheezing or shortness of breath.  Marland Kitchen atorvastatin (LIPITOR) 20 MG tablet Take 20 mg by mouth at bedtime.   Marland Kitchen buPROPion (WELLBUTRIN XL) 300 MG 24 hr tablet Take 300 mg by mouth daily.  . calcium-vitamin D (OSCAL WITH D) 250-125 MG-UNIT per tablet Take 1 tablet by mouth daily.  . carbidopa-levodopa (SINEMET IR) 25-100 MG tablet Take 1.5 tablets by mouth 3 (three) times daily.  . Carbidopa-Levodopa ER (SINEMET CR) 25-100 MG tablet controlled release Take 1 tablet by mouth 2 (two) times daily.  . cetirizine (ZYRTEC) 10 MG tablet Take 10 mg by mouth daily.  Marland Kitchen donepezil (ARICEPT) 10 MG tablet Take 1 tablet (10 mg total) by mouth at bedtime.  Marland Kitchen  doxycycline (VIBRA-TABS) 100 MG tablet Take 1 tablet (100 mg total) by mouth 2 (two) times daily.  . ferrous gluconate (FERGON) 324 MG tablet TAKE 1 TABLET BY MOUTH DAILY WITH BREAKFAST  . ferrous sulfate 325 (65 FE) MG tablet Take 325 mg by mouth 2 (two) times daily with a meal.   . Fluticasone-Umeclidin-Vilant (TRELEGY ELLIPTA) 100-62.5-25 MCG/INH AEPB Inhale 1 puff into the lungs daily.  Nyoka Cowden Tea 315 MG CAPS Take 315 mg by mouth daily.  . hydrochlorothiazide (HYDRODIURIL) 25 MG tablet Take 25 mg by mouth daily.  Marland Kitchen MEGARED OMEGA-3 KRILL OIL PO Take 1 capsule by mouth daily.  . metFORMIN (GLUCOPHAGE) 500 MG tablet Take 1,000 mg by mouth 2 (two) times daily with a meal.   . Multiple Vitamins-Minerals (MULTIVITAMIN WITH MINERALS) tablet Take 1 tablet by mouth daily.  Marland Kitchen omeprazole (PRILOSEC OTC) 20 MG tablet Take 20 mg by mouth daily with breakfast.  . sertraline (ZOLOFT) 100 MG tablet Take 150 mg by mouth daily.  . vardenafil (LEVITRA) 20 MG tablet Take 20 mg by mouth daily as needed for erectile dysfunction.    No current facility-administered medications on file prior to visit.     Allergies  Allergen Reactions  . Aspirin Other (See Comments)  . Codeine Itching  . Morphine Itching    Review Of Systems:  Constitutional:   No  weight loss, night sweats,  Fevers, chills, fatigue, or  lassitude.  HEENT:   No headaches,  Difficulty swallowing,  Tooth/dental problems, or  Sore throat,                No sneezing, itching, ear ache, nasal congestion, post nasal drip,   CV:  No chest pain,  Orthopnea, PND, swelling in lower extremities, anasarca, dizziness, palpitations, syncope.   GI  No heartburn, indigestion, abdominal pain, nausea, vomiting, diarrhea, change in bowel habits, loss of appetite, bloody stools.   Resp: + shortness of breath with exertion less at rest.  + excess mucus, Minimal  productive cough,  + non-productive cough,  No coughing up of blood.  + change in color of  mucus.  ++++ wheezing.  No chest wall deformity  Skin: no rash or lesions.  GU: no dysuria, change in color of urine, no urgency or frequency.  No flank pain, no hematuria   MS:  No joint pain or swelling.  No decreased range of motion.  No back pain.  Psych:  No change in mood or affect. No depression or anxiety.  No memory loss.   Vital Signs BP (!) 142/74 (BP Location: Left Arm, Cuff Size: Normal)   Pulse 91   Temp 97.8 F (36.6 C) (Oral)   Ht 6' (1.829 m)   Wt 238 lb 6.4 oz (108.1 kg)   SpO2 91%   BMI 32.33 kg/m    Physical Exam:  General- No distress,  A&Ox3, pleasant, audible wheezing ENT: No sinus tenderness, TM clear, pale nasal mucosa, no oral exudate,no post nasal drip, no LAN Cardiac: S1, S2, regular rate and rhythm, no murmur Chest: + wheeze/ rales/ dullness; no accessory muscle use, no nasal flaring, no sternal retractions, diminished per bases Abd.: Soft Non-tender, ND, BS +, Body mass index is 32.33 kg/m. Ext: No clubbing cyanosis, edema Neuro:  A&O x 3, MAE x 4, Physically deconditioned Skin: No rashes, No lesions, warm and dry Psych: normal mood and behavior   Assessment/Plan COPD with acute exacerbation Using Breztri until samples run out then will transition to Trelegy ( what insurance will pay for) Not wearing oxygen with exertion despite recommendation to do so. Plan Stop Smoking completely. Echo is scheduled for 06/06/2020. We will order your oxygen through a different DME.  We will order home oxygen and a POC. The supplies from [facility] at present do not meet the patient's needs.  We will place an order for Xopenex nebs. ( Pt has dizziness with albuterol nebs) Use as needed for breakthrough shortness of breath or wheezing.  We will order a new Nebulizer machine. We will order a flutter valve. Use several times daily. This will help you cough up secretions.  We will prescribe  Doxycycline 100 mg twice daily Use sun block if you are in the  sun Probiotic or yogurt or yogurt while on antibiotic Prednisone taper; 10 mg tablets: 4 tabs x 2 days, 3 tabs x 2 days, 2 tabs x 2 days 1 tab x 2 days then stop. Follow up in 2 weeks with [name] NP or Dr. Halford Chessman.  We will review echo results at that visit.  Note your daily symptoms > remember "red flags" for COPD:  Increase in cough, increase in sputum production, increase in shortness of breath or activity intolerance. If you notice these symptoms, please call to be seen.   Please contact office for sooner follow up if symptoms do not improve or worsen or seek emergency care   Consider Pulmonary Rehab at follow up  Pulmonary Nodule Stable on CT Chest 3/9 Plan Annual screening  Concern for [facility] Plan Echo 4/13   Magdalen Spatz, NP 06/04/2020  2:26 PM

## 2020-06-04 NOTE — Patient Instructions (Addendum)
It is good to see you today. Stop Smoking completely. Echo is scheduled for 06/06/2020. We will order your oxygen through a different DME.  We will order home oxygen and a POC. The supplies from [facility] at present do not meet the patient's needs.  We will place an order for Xopenex nebs. ( Pt has dizziness with albuterol nebs) Use as needed for breakthrough shortness of breath or wheezing.  We will order a new Nebulizer machine. We will order a flutter valve. Use several times daily. This will help you cough up secretions.  We will prescribe  Doxycycline 100 mg twice daily Use sun block if you are in the sun Probiotic or yogurt or yogurt while on antibiotic Prednisone taper; 10 mg tablets: 4 tabs x 2 days, 3 tabs x 2 days, 2 tabs x 2 days 1 tab x 2 days then stop. Follow up in 2 weeks with [name] NP or DR. Sood.  We will review echo results at that visit.  Note your daily symptoms > remember "red flags" for COPD:  Increase in cough, increase in sputum production, increase in shortness of breath or activity intolerance. If you notice these symptoms, please call to be seen.   Please contact office for sooner follow up if symptoms do not improve or worsen or seek emergency care

## 2020-06-05 NOTE — Telephone Encounter (Signed)
I have faxed these orders to [facility]

## 2020-06-05 NOTE — Telephone Encounter (Signed)
James [name] pt is unable to change DME providers at this time. Please advise.

## 2020-06-05 NOTE — Telephone Encounter (Signed)
We sent this order to [facility] they have called in and stated they can take the order due to the insurance being a medicare and they have already received 21 payments which usually means they have to wait 5 years from when they got it.

## 2020-06-05 NOTE — Telephone Encounter (Signed)
Spoke with Kenney Houseman from [employer] who states pt has been with their company since 2014 (8 years) which restarted the 5 year billing cycle. Kenney Houseman also stated it would be up to [facility] weather or not to take the pt on as a client. Will rout to [facility] pool to see if they can send to another DME company.

## 2020-06-06 ENCOUNTER — Ambulatory Visit (HOSPITAL_COMMUNITY): Payer: Medicare Other | Attending: Cardiology

## 2020-06-06 ENCOUNTER — Other Ambulatory Visit: Payer: Self-pay

## 2020-06-06 DIAGNOSIS — I272 Pulmonary hypertension, unspecified: Secondary | ICD-10-CM

## 2020-06-06 LAB — ECHOCARDIOGRAM COMPLETE
Area-P 1/2: 2.16 cm2
S' Lateral: 3.4 cm

## 2020-06-06 MED ORDER — PERFLUTREN LIPID MICROSPHERE
1.0000 mL | INTRAVENOUS | Status: AC | PRN
  Administered 2020-06-06: 2 mL via INTRAVENOUS

## 2020-06-06 NOTE — Progress Notes (Signed)
Reviewed and agree with assessment/plan.   Chesley Mires, MD [facility] Pulmonary/Critical Care 06/06/2020, 4:00 PM Pager:  [phone]

## 2020-06-07 NOTE — Telephone Encounter (Signed)
[facility] got the orders waiting to see if they can take the patient

## 2020-06-12 ENCOUNTER — Telehealth: Payer: Self-pay | Admitting: Acute Care

## 2020-06-12 DIAGNOSIS — J439 Emphysema, unspecified: Secondary | ICD-10-CM

## 2020-06-12 NOTE — Telephone Encounter (Signed)
Order has been placed for the pt to get the nebulizer sent to [facility] instead of [facility]>  I have called the pts wife and LM on her VM to make her aware of order that has been sent in.  Nothing further is needed.

## 2020-06-14 NOTE — Telephone Encounter (Signed)
Please advise on update. Thanks.  

## 2020-06-14 NOTE — Telephone Encounter (Signed)
They have talked to the wife and explained it

## 2020-06-14 NOTE — Telephone Encounter (Signed)
[facility] is not able to do this due to the oxygen Cap they will have to stay with [facility]

## 2020-06-18 ENCOUNTER — Telehealth: Payer: Self-pay | Admitting: Acute Care

## 2020-06-18 ENCOUNTER — Ambulatory Visit (INDEPENDENT_AMBULATORY_CARE_PROVIDER_SITE_OTHER): Payer: Medicare Other | Admitting: Acute Care

## 2020-06-18 ENCOUNTER — Other Ambulatory Visit: Payer: Self-pay

## 2020-06-18 ENCOUNTER — Encounter: Payer: Self-pay | Admitting: Acute Care

## 2020-06-18 ENCOUNTER — Other Ambulatory Visit: Payer: Self-pay | Admitting: Acute Care

## 2020-06-18 VITALS — BP 130/74 | HR 89 | Temp 97.4°F | Ht 73.0 in | Wt 236.4 lb

## 2020-06-18 DIAGNOSIS — R0902 Hypoxemia: Secondary | ICD-10-CM | POA: Diagnosis not present

## 2020-06-18 DIAGNOSIS — J439 Emphysema, unspecified: Secondary | ICD-10-CM | POA: Diagnosis not present

## 2020-06-18 DIAGNOSIS — F17210 Nicotine dependence, cigarettes, uncomplicated: Secondary | ICD-10-CM | POA: Diagnosis not present

## 2020-06-18 NOTE — Patient Instructions (Addendum)
It is good to see you today Continue Breztri 2 puffs twice daily until gone. Rinse after use. Once James [name] is gone, please start Trelegy, 1 puff once daily. Rinse mouth after use. Albuterol for breakthrough shortness of breath or wheezing up to 4 times daily . If you need this more often call us to be seen Continue Mucinex 1200 mg daily Add Flutter valve 4 times daily. Stop Smoking completely.   We will call re:  Xopenex nebs. ( Pt has dizziness with albuterol nebs) Use as needed for breakthrough shortness of breath or wheezing.  We will call about your nebulizer machine We will give you  a flutter valve. Use several times daily. This will help you cough up secretions.  Watch instruction video on YouTube Follow up in 2 weeks with [name] NP or Dr. Halford Chessman.  We will refer to cardiology as you are at high risk for cardiac issues with your pulmonary disease Note your daily symptoms >remember "red flags" for COPD: Increase in cough, increase in sputum production, increase in shortness of breath or activity intolerance. If you notice these symptoms, please call to be seen.  Please contact office for sooner follow up if symptoms do not improve or worsen or seek emergency care  Pulmonary Rehab referral under Dr. Halford Chessman. Wear oxygen at 2 L [place] with any activity. Do not leave home without your oxygen.  Wear oxygen at bedtime as you have been doing.  Saturation goal is > 88%.   Pulmonary Nodule Stable on CT Chest 3/9 Plan Annual screening 04/2021  Follow up with Dr. Halford Chessman in 3 months, or next available after 3 months.  Please contact office for sooner follow up if symptoms do not improve or worsen or seek emergency care

## 2020-06-18 NOTE — Progress Notes (Signed)
Reviewed and agree with assessment/plan.   Chesley Mires, MD [facility] Pulmonary/Critical Care 06/18/2020, 12:16 PM Pager:  [phone]

## 2020-06-18 NOTE — Addendum Note (Signed)
Addended byCoralie Keens on: 06/18/2020 02:08 PM   Modules accepted: Orders

## 2020-06-18 NOTE — Telephone Encounter (Signed)
Patient wife asked today at [place] with [name] to reach out to [facility] to figure out when nebulizer machine will be delivered. She has become quite frustrated and I told wife I would call and see. I called [facility] and it will be delivered tomorrow. I called and notified Lucy,who is on patient DPR, that it will be delivered tomorrow and to call us if it is not. She verbalized understanding, nothing further needed.

## 2020-06-18 NOTE — Progress Notes (Signed)
History of Present Illness 75 y.o.maleremote smoker( Quit 2015 with a 75 pack year smoking history, sneaks an occasional cigarette)with COPD/emphysema, chronic hypoxic respiratory failure on supplemental oxygen at night andwithExertion. He is also followed for apulmonary nodule.He is followed by Dr. Halford Chessman.  06/18/2020 Acute OV Follow up Pt. Presents for follow up. He was seen 4/11 with an acute exacerbation of his COPD. He had had  a CT Chest 05/10/2020. This showed moderate to severe emphysema and infectious or inflammatory bronchitis. No fibrosis.  No pneumonia.He was not wearing his oxygen, and he did not have his rescue inhaler with him. He does not check his oxygen saturation when he is walking, so he does not know if his sats drop below 88%.  We treated him for a flare with Doxycycline and a prednisone taper. Started him on flutter valve and Mucinex, and told him he needs to stop smoking completely and wear his oxygen without fail with activity. We placed an order for a POC. We also ordered a new nebulizer machine so that he can use nebs as rescue. He will transition from [place] to Trelegy ( What his insurance will pay for) once he runs out of his Breztri samples.   Currently patient is having a hard time getting POC from his DME.   Pt. Presents today for follow up. He states he has completed his Doxycycline and  Prednisone taper. He did not His wife states he has had less of a cough. Pt. States that he did not start a flutter valve , as they were unable to get one. He has been using Mucinex  and he has been using his Breztri. He will transition back to Trelegy once this runs out.   We Considered Daliresp, but patient has a history of depression which he takes medication for, so we will not initiate this today.  Pt's wife says she has found a POC on [employer] that she can purchase for $300.00. She states this goes up to 3 L and is continuous flow.  Pt is here again today without his  oxygen. He does not really understand that he needs to wear his oxygen at all times. I have again explained that the drops in his oxygen level harm his heart over time. Echo was reviewed. Pt wife would like a cardiology consult, as she is aware he is at a higher risk for heart disease.   Pt is clinically much better after treatment. Cough has resolved. He needs to wear his oxygen with activity without fail.   Test Results: Pulmonary tests: Spirometry 06/08/12 >> FEV1 1.91 (49%), FEV1% 59 ONO with RA 06/16/12 >>Test time 9 hrs 21 min. Mean SpO2 89%, low SpO2 63%. Spent 2 hrs 25 min with SpO2 <88% 07/07/12 >> SpO2 86% on room air with exertion >> start 2 liters oxygen with exertion and sleep A1AT 07/07/12 >> 154, PI-MM  Chest imaging CT chest 03/23/13 >> mild/diffuse centrilobular emphysema, 4 mm nodule RLL superior segment, 4 mm nodule LLL CT chest 02/20/14 >> moderate centrilobular/paraseptal emphysema, mild diffuse bronchial wall thickening, 3 mm nodule RLL and 5 mm nodule LLL no change CT chest 02/13/15 >> new 1.5 cm nodule RLL. CT chest 05/28/15 >> increased RLL irregular nodule to 2 cm PET scan 06/14/15 >> 1.93 SUV RLL 11 mm lesion CT chest 11/13/15 >> no progression CT chest 05/28/16 >> no change in lung nodule CT chest 06/01/17 >> no change CT Chest 05/02/2020 >> stable pulmonary nodules  Sleep tests: PSG  01/25/08 >>RDI 6.8, SpO2 low 84%. Spent 79% of test time with SpO2 <90%    Echo 06/06/2020             CBC Latest Ref Rng & Units 04/17/2020 10/14/2019 06/20/2019  WBC 4.0 - 10.5 K/uL 9.1 6.9 5.8  Hemoglobin 13.0 - 17.0 g/dL 11.1(L) 13.1 15.4  Hematocrit 39.0 - 52.0 % 37.4(L) 41.7 47.5  Platelets 150 - 400 K/uL 308 278 215    BMP Latest Ref Rng & Units 03/18/2013 02/08/2008 11/24/2006  Glucose 70 - 99 mg/dL 130(H) 92 106(H)  BUN 6 - 23 mg/dL 14 8 10   Creatinine 0.4 - 1.5 mg/dL 1.1 0.92 0.92  Sodium 135 - 145 mEq/L 141 143 141  Potassium 3.5 - 5.1 mEq/L 3.9 4.1 4.8   Chloride 96 - 112 mEq/L 102 107 106  CO2 19 - 32 mEq/L 33(H) 31 29  Calcium 8.4 - 10.5 mg/dL 9.4 9.0 9.3    BNP No results found for: BNP  ProBNP No results found for: PROBNP  PFT    Component Value Date/Time   FEV1PRE 1.84 05/02/2020 1253   FEV1POST 2.04 05/02/2020 1253   FVCPRE 3.51 05/02/2020 1253   FVCPOST 3.72 05/02/2020 1253   TLC 7.84 05/02/2020 1253   DLCOUNC 11.54 05/02/2020 1253   PREFEV1FVCRT 52 05/02/2020 1253   PSTFEV1FVCRT 55 05/02/2020 1253    ECHOCARDIOGRAM COMPLETE  Result Date: 06/06/2020    ECHOCARDIOGRAM REPORT   Patient Name:   James [name] Parkerson Date of Exam: 06/06/2020 Medical Rec #:  [id number]         Height:       72.0 in Accession #:    [id number]        Weight:       238.4 lb Date of Birth:  10-07-1945          BSA:          2.296 m Patient Age:    20 years          BP:           130/72 mmHg Patient Gender: M                 HR:           87 bpm. Exam Location:  [place] Procedure: 2D Echo, Cardiac Doppler, Color Doppler and Intracardiac            Opacification Agent Indications:    I27.20 Pulmonary hypertnsion  History:        Patient has prior history of Echocardiogram examinations, most                 recent 01/31/2006. COPD; Risk Factors:Diabetes, Hypertension,                 Dyslipidemia and Sleep Apnea.  Sonographer:    Wilford Sports Rodgers-Jones RDCS Referring Phys: [place]  1. Left ventricular ejection fraction, by estimation, is 60 to 65%. The left ventricle has normal function. The left ventricle has no regional wall motion abnormalities. Left ventricular diastolic parameters are consistent with Grade I diastolic dysfunction (impaired relaxation).  2. Right ventricular systolic function is normal. The right ventricular size is normal. Tricuspid regurgitation signal is inadequate for assessing PA pressure.  3. The mitral valve is normal in structure. No evidence of mitral valve regurgitation. No evidence of mitral stenosis.  4. The  aortic valve is tricuspid. Aortic valve regurgitation is trivial. No aortic stenosis is present.  5. Aortic dilatation noted. There is moderate dilatation of the aortic root, measuring 45 mm.  6. The inferior vena cava is normal in size with greater than 50% respiratory variability, suggesting right atrial pressure of 3 mmHg. FINDINGS  Left Ventricle: Left ventricular ejection fraction, by estimation, is 60 to 65%. The left ventricle has normal function. The left ventricle has no regional wall motion abnormalities. Definity contrast agent was given IV to delineate the left ventricular  endocardial borders. The left ventricular internal cavity size was normal in size. There is no left ventricular hypertrophy. Left ventricular diastolic parameters are consistent with Grade I diastolic dysfunction (impaired relaxation). Right Ventricle: The right ventricular size is normal. No increase in right ventricular wall thickness. Right ventricular systolic function is normal. Tricuspid regurgitation signal is inadequate for assessing PA pressure. Left Atrium: Left atrial size was normal in size. Right Atrium: Right atrial size was normal in size. Pericardium: There is no evidence of pericardial effusion. Mitral Valve: The mitral valve is normal in structure. No evidence of mitral valve regurgitation. No evidence of mitral valve stenosis. Tricuspid Valve: The tricuspid valve is normal in structure. Tricuspid valve regurgitation is not demonstrated. Aortic Valve: The aortic valve is tricuspid. Aortic valve regurgitation is trivial. No aortic stenosis is present. Pulmonic Valve: The pulmonic valve was normal in structure. Pulmonic valve regurgitation is not visualized. Aorta: Aortic dilatation noted. There is moderate dilatation of the aortic root, measuring 45 mm. Venous: The inferior vena cava is normal in size with greater than 50% respiratory variability, suggesting right atrial pressure of 3 mmHg. IAS/Shunts: No atrial level  shunt detected by color flow Doppler.  LEFT VENTRICLE PLAX 2D LVIDd:         5.10 cm  Diastology LVIDs:         3.40 cm  LV e' medial:    5.11 cm/s LV PW:         1.00 cm  LV E/e' medial:  13.7 LV IVS:        1.00 cm  LV e' lateral:   8.55 cm/s LVOT diam:     2.30 cm  LV E/e' lateral: 8.2 LV SV:         93 LV SV Index:   41 LVOT Area:     4.15 cm  RIGHT VENTRICLE RV Basal diam:  4.00 cm RV S prime:     15.00 cm/s TAPSE (M-mode): 2.2 cm LEFT ATRIUM             Index       RIGHT ATRIUM           Index LA diam:        4.60 cm 2.00 cm/m  RA Area:     15.60 cm LA Vol (A2C):   50.5 ml 22.00 ml/m RA Volume:   40.30 ml  17.55 ml/m LA Vol (A4C):   40.0 ml 17.42 ml/m LA Biplane Vol: 45.8 ml 19.95 ml/m  AORTIC VALVE LVOT Vmax:   126.00 cm/s LVOT Vmean:  85.600 cm/s LVOT VTI:    0.225 m  AORTA Ao Root diam: 4.50 cm Ao Asc diam:  3.50 cm MITRAL VALVE MV Area (PHT): 2.16 cm    SHUNTS MV Decel Time: 352 msec    Systemic VTI:  0.22 m MV E velocity: 69.80 cm/s  Systemic Diam: 2.30 cm MV A velocity: 92.20 cm/s MV E/A ratio:  0.76 Loralie Champagne MD Electronically signed by Loralie Champagne MD Signature Date/Time: 06/06/2020/5:51:54 PM  Final      Past medical hx Past Medical History:  Diagnosis Date  . Allergy    seasonal  . Blind left eye    legally  . Blood transfusion    2002  . COPD (chronic obstructive pulmonary disease) ([place])   . Depression   . Diabetes mellitus, type 2 ([place])   . Diverticulosis   . ED (erectile dysfunction)   . Glaucoma   . Hyperlipidemia   . Hypertension   . Insomnia   . Internal and external bleeding hemorrhoids   . Iron deficiency anemia   . OSA (obstructive sleep apnea)   . Osteoarthritis   . Rosacea   . Tremor of both hands   . Tubular adenoma of colon 03/2011     Social History   Tobacco Use  . Smoking status: Current Some Day Smoker    Packs/day: 1.50    Years: 50.00    Pack years: 75.00    Types: Cigarettes    Last attempt to quit: 04/29/2013    Years since  quitting: 7.1  . Smokeless tobacco: Never Used  . Tobacco comment: Stateshe sometimes sneaks a cigarette  Substance Use Topics  . Alcohol use: Yes    Alcohol/week: 0.0 standard drinks    Comment: once yearly  . Drug use: No    Mr.Fentress reports that he has been smoking cigarettes. He has a 75.00 pack-year smoking history. He has never used smokeless tobacco. He reports current alcohol use. He reports that he does not use drugs.  Tobacco Cessation: Current some day smoker with 75 pack year smoking history.  Past surgical hx, Family hx, Social hx all reviewed.  Current Outpatient Medications on File Prior to Visit  Medication Sig  . albuterol (VENTOLIN HFA) 108 (90 Base) MCG/ACT inhaler Inhale 1-2 puffs into the lungs every 6 (six) hours as needed for wheezing or shortness of breath.  Marland Kitchen atorvastatin (LIPITOR) 20 MG tablet Take 20 mg by mouth at bedtime.   Marland Kitchen buPROPion (WELLBUTRIN XL) 300 MG 24 hr tablet Take 300 mg by mouth daily.  . calcium-vitamin D (OSCAL WITH D) 250-125 MG-UNIT per tablet Take 1 tablet by mouth daily.  . carbidopa-levodopa (SINEMET IR) 25-100 MG tablet Take 1.5 tablets by mouth 3 (three) times daily.  . Carbidopa-Levodopa ER (SINEMET CR) 25-100 MG tablet controlled release Take 1 tablet by mouth 2 (two) times daily.  . cetirizine (ZYRTEC) 10 MG tablet Take 10 mg by mouth daily.  Marland Kitchen donepezil (ARICEPT) 10 MG tablet Take 1 tablet (10 mg total) by mouth at bedtime.  Marland Kitchen doxycycline (VIBRA-TABS) 100 MG tablet Take 1 tablet (100 mg total) by mouth 2 (two) times daily.  Marland Kitchen doxycycline (VIBRA-TABS) 100 MG tablet Take 1 tablet (100 mg total) by mouth 2 (two) times daily.  . ferrous gluconate (FERGON) 324 MG tablet TAKE 1 TABLET BY MOUTH DAILY WITH BREAKFAST  . ferrous sulfate 325 (65 FE) MG tablet Take 325 mg by mouth 2 (two) times daily with a meal.   . Green Tea 315 MG CAPS Take 315 mg by mouth daily.  . hydrochlorothiazide (HYDRODIURIL) 25 MG tablet Take 25 mg by mouth daily.   Marland Kitchen levalbuterol (XOPENEX) 0.31 MG/3ML nebulizer solution Take 3 mLs (0.31 mg total) by nebulization every 4 (four) hours as needed for wheezing.  Marland Kitchen MEGARED OMEGA-3 KRILL OIL PO Take 1 capsule by mouth daily.  . metFORMIN (GLUCOPHAGE) 500 MG tablet Take 1,000 mg by mouth 2 (two) times daily with a meal.   .  mirtazapine (REMERON) 15 MG tablet Take 15 mg by mouth at bedtime.  . Multiple Vitamins-Minerals (MULTIVITAMIN WITH MINERALS) tablet Take 1 tablet by mouth daily.  Marland Kitchen omeprazole (PRILOSEC OTC) 20 MG tablet Take 20 mg by mouth daily with breakfast.  . sertraline (ZOLOFT) 100 MG tablet Take 150 mg by mouth daily.  . vardenafil (LEVITRA) 20 MG tablet Take 20 mg by mouth daily as needed for erectile dysfunction.   . Fluticasone-Umeclidin-Vilant (TRELEGY ELLIPTA) 100-62.5-25 MCG/INH AEPB Inhale 1 puff into the lungs daily. (Patient not taking: Reported on 06/18/2020)   No current facility-administered medications on file prior to visit.     Allergies  Allergen Reactions  . Aspirin Other (See Comments)  . Codeine Itching  . Morphine Itching    Review Of Systems:  Constitutional:   No  weight loss, night sweats,  Fevers, chills, fatigue, or  lassitude.  HEENT:   No headaches,  Difficulty swallowing,  Tooth/dental problems, or  Sore throat,                No sneezing, itching, ear ache, nasal congestion, post nasal drip,   CV:  No chest pain,  Orthopnea, PND, swelling in lower extremities, anasarca, dizziness, palpitations, syncope.   GI  No heartburn, indigestion, abdominal pain, nausea, vomiting, diarrhea, change in bowel habits, loss of appetite, bloody stools.   Resp: + shortness of breath with exertion less at rest.  + baseline  excess mucus, no productive cough,  No non-productive cough,  No coughing up of blood.  No change in color of mucus.  No wheezing.  No chest wall deformity  Skin: no rash or lesions.  GU: no dysuria, change in color of urine, no urgency or frequency.  No  flank pain, no hematuria   MS:  No joint pain or swelling.  No decreased range of motion.  No back pain.  Psych:  No change in mood or affect. No depression or anxiety.  No memory loss.   Vital Signs BP 130/74 (BP Location: Right Arm, Cuff Size: Normal)   Pulse 89   Temp (!) 97.4 F (36.3 C) (Oral)   Ht 6\' 1"  (1.854 m)   Wt 236 lb 6.4 oz (107.2 kg)   SpO2 90%   BMI 31.19 kg/m    Physical Exam:  General- No distress,  A&Ox3, pleasant ENT: No sinus tenderness, TM clear, pale nasal mucosa, no oral exudate,no post nasal drip, no LAN Cardiac: S1, S2, regular rate and rhythm, no murmur Chest: No wheeze/ rales/ dullness; no accessory muscle use, no nasal flaring, no sternal retractions, diminished per bases Abd.: Soft Non-tender, ND, BS +, Body mass index is 31.19 kg/m. Ext: No clubbing cyanosis, edema Neuro:  Deconditioned at baseline, MAE x 4, A&O x 3 Skin: No rashes, No lesions, warm and dry Psych: normal mood and behavior   Assessment/Plan Resolved COPD Exacerbation Continued hypoxemia 2/2 emphysema and refusal to wear oxygen with activity Plan Continue Breztri 2 puffs twice daily until gone. Rinse after use. Once Judithann Sauger is gone, please start Trelegy, 1 puff once daily. Rinse mouth after use. Albuterol for breakthrough shortness of breath or wheezing up to 4 times daily . If you need this more often call us to be seen Continue Mucinex 1200 mg daily Add Flutter valve 4 times daily. Stop Smoking completely.   We will call re:  Xopenex nebs. ( Pt has dizziness with albuterol nebs) Use as needed for breakthrough shortness of breath or wheezing.  We will call  about your nebulizer machine We will give you  a flutter valve. Use several times daily. This will help you cough up secretions.  Watch instruction video on YouTube Follow up in 2 weeks with [name] NP or Dr. Halford Chessman.  We will refer to cardiology as you are at high risk for cardiac issues with your pulmonary  disease Note your daily symptoms >remember "red flags" for COPD: Increase in cough, increase in sputum production, increase in shortness of breath or activity intolerance. If you notice these symptoms, please call to be seen.  Please contact office for sooner follow up if symptoms do not improve or worsen or seek emergency care  Pulmonary Rehab referral under Dr. Halford Chessman. Wear oxygen at 2 L [place] with any activity. Do not leave home without your oxygen.  Wear oxygen at bedtime as you have been doing.  Saturation goal is > 88%. Please contact office for sooner follow up if symptoms do not improve or worsen or seek emergency care   Pulmonary Nodule Stable on CT Chest 3/9 Plan Annual screening 04/2021    Magdalen Spatz, NP 06/18/2020  11:40 AM

## 2020-06-21 ENCOUNTER — Telehealth: Payer: Self-pay | Admitting: Acute Care

## 2020-06-21 MED ORDER — LEVALBUTEROL HCL 0.31 MG/3ML IN NEBU: 1.0000 | INHALATION_SOLUTION | RESPIRATORY_TRACT | 12 refills | Status: DC | PRN

## 2020-06-21 NOTE — Telephone Encounter (Signed)
Called and spoke with James [name]. She stated that [facility] will not fill the Xopenex without the diagnosis code. I advised her that I would resend the prescription. She verbalized understanding.   RX has been sent. Nothing further needed.

## 2020-06-21 NOTE — Telephone Encounter (Signed)
LMTCB for James [name]- not on DPR and will need to speak with the pt only   xopenex neb sol [facility] on [address] 06/04/20 and receipt confirmed by pharmacy

## 2020-06-25 NOTE — Progress Notes (Signed)
Please call patient and let him know he has grade 1 diastolic dysfunction per his echo. They were unable to evaluate his Pulmonary artery pressure  from the echo. Please have him set up for a televisit and we can discuss. Thanks

## 2020-06-25 NOTE — Progress Notes (Deleted)
PATIENT: James [name] DOB: January 22, 1946  REASON FOR VISIT: follow up HISTORY FROM: patient  No chief complaint on file.    HISTORY OF PRESENT ILLNESS: 06/25/20 ALL:   05/08/2020 SA:  James [name] is a 75 year old right-handed gentleman with an underlying medical history of hyperlipidemia, depression, obesity, obstructive sleep apnea, hypertension, glaucoma, osteopenia, iron deficiency anemia, hypertension, former smoker with history of COPD/emphysema, chronic hypoxic respiratory failure, right lower lobe lung nodule and (followed by Dr. Lannette Donath for FU consultation of his parkinsonism. He is accompanied by his wife today. I last saw him on 06/16/2019 for evaluation of his sleep apnea.  He had a prior diagnosis of sleep apnea and had been on CPAP in the past, he could not tolerate it.  He has been on oxygen.  He was advised to proceed with a sleep study.  His interim baseline sleep study from 07/13/2019 showed no significant obstructive sleep apnea with an AHI of 3.9/h.  Average oxygen saturation without oxygen supplementation was 88%, nadir was 83%, time below 89% saturation was 114 minutes.  The study was started without supplemental oxygen and he uses 3 L/min supplemental oxygen at home.  He was given supplemental oxygen at 1:21 AM via nasal cannula at 1 L/min and his oxygen saturations improved in the 89 to 90% range.  He was advised to follow-up with his pulmonologist and advised that he did not need CPAP therapy.  Today, 05/08/20: He reports and he continues to have difficulty with his gait.  He uses a cane sometimes when he goes outside but not always.  He has fallen, typically when he is not using his cane.  He is dependent on oxygen at night, 3 L/min.  He follows with Dr. Halford Chessman and his nurse practitioner.  He is trying to get an oxygen concentrator that is portable so he can go on a walk.  He does have smaller oxygen tanks but they do not last very long. He denies any  significant constipation.  His wife believes his memory has become worse and he is more forgetful.  He goes to bed late, around 2 AM and gets up around 10 AM.  Generally, he takes Sinemet immediate release 3 times a day, 1-1/2 pills each time and the Sinemet CR 25-100 mg strength twice daily, at 10 AM and 2 AM.  He takes the other at 10 AM, 5 PM and midnight typically.  Sometimes he skips lunch.  Sometimes he eats a smaller meal around 2 PM.   06/01/2019 ALL:  James [name] is a 75 y.o. male here today for follow up for PD. He continues Sinemet IR 1.5 tablets TID and Sinemet CR 25-132m tablets BID. He reports that he has felt better since adding Sinemet CR. He feels that tremor has improved. He feels gait is stable. He denies falls. He is not using his cane. He does not feel that he needs it. Memory is fairly stable. He continues Aricept 597mdaily. He feels that it may be helping some and would like to increase dose. Depression is stable. He is taking Wellbutrin 3007mnd Zoloft 150m22mily. PCP manages these medications. He is eating better. He has gained 3 pounds in 3 months.   He does endorse difficulty getting to sleep and staying asleep. He does snore. He is excessively tired throughout the day. He reports having a history of sleep apnea and was on CPAP therapy years ago. He is not sure who was managing this for him.  He would like to have another sleep study.   HISTORY: (copied from  note on 03/03/2019)  James Maze Sox is a 75 y.o. male here today for follow up of PD and memory loss.  [place] reports that since last being seen tremors seem to have worsened.  He is concerned that the increased dose of Sinemet may have caused nausea and dizziness.  He does note improvement in tremors after taking Sinemet IR.  He does have a longstanding history of dizziness with position changes and is uncertain if dizziness noted since last being seen is related to Sinemet or not.  He did start Aricept 5 mg daily.  He  is uncertain if this medication has helped at all.  He denies adverse effects.  He does continue to note difficulty with word recall.  Short-term memory has worsened.  He has had multiple falls.  His wife reports that he seems to get tripped up as he shuffles his feet when he walks.  This is worse when he is tired.  He has started using a single prong cane.  This is helped with stability.  Although he does feel that his appetite has declined somewhat, he continues to eat well.  He has lost about 9 pounds since last being seen.  He denies any difficulty swallowing.  He does not drive.  He continues to perform ADLs without assistance.  HISTORY: (copied from my note on 11/29/2018)  James [name] a 75 y.o.malehere today for follow up for Parkinson's Disease. He continues Sinemet1.5tabletsTID. He does note more shaking. He does feel that he shakes a little less afterwards. Usual dosing is 10a, 4p and 10p. He has had more falls over the past year. Maybe 8-10 falls. Once he fell about 36f on his back. He denies injuries. His wife reports that his steps are getting shorter. He participated in PT (around 04/2015) but doesn't feel that it helped. He is more depressed. He is working with PCP and taking Zoloft 1514mand Wellbutrin 30041maily. He does feel that memory is declining. He watched a movie with his wife recently but did not remember it the next day. He is doing well otherwise. He enjoys [place] his [place]. His wife is a matMicrobiologist [employer] HISTORY: (copied fromMegan Millikan'snote on 11/26/2017)  James [name] a 72 9ar old male with a history of Parkinson's disease. He returns today for follow-up. He is currently on Sinemet 1/2 tablet 3 times a day. He feels overall that his tremor may have gotten slightly worse. He notices the tremor primarily in the upper extremities and in the left leg. No change ingait he does report that his balance is off. He states  that over the summer he was getting out of the pool and he did fall. Fortunately did not suffer any injuries. He does not use a cane or walker. Denies any trouble swallowing. Denies eating choked on food or liquids. He reports that his sleep is fragmented. He states that he tends to wake up every hour during the night. For that reason he tends to nap a lot during the day. He returns today for evaluation.  HISTORY04/01/19 James [name] a [age]ar old male with a history of Parkinson's disease. He returns today for follow-up. He reports that he is doing fairly well. Continues on Sinemet 1-1/2 tablets 3 times a day. He reports that he has a tremor in the right hand that may have gotten slightly worse. In regards to his  gait or balance he reports that he has had more episodes of tripping but no falls. He states that he is trying to continue doing the exercises he learned in physical therapy. Denies any changes with the bowels or bladder. Denies any changes withswallowing or chewing food. Reports trouble sleeping. Tends to go to bed around 4 AM and wake up at 8 AM and naps frequently throughout the day. He states that he tries to stay well-hydrated with water. Returns today for evaluation.   REVIEW OF SYSTEMS: Out of a complete 14 system review of symptoms, the patient complains only of the following symptoms, tremor, memory loss, depression, snoring, excessive daytime sleepiness and all other reviewed systems are negative.  ESS: 16 FSS: 52  ALLERGIES: Allergies  Allergen Reactions  . Aspirin Other (See Comments)  . Codeine Itching  . Morphine Itching    HOME MEDICATIONS: Outpatient Medications Prior to Visit  Medication Sig Dispense Refill  . albuterol (VENTOLIN HFA) 108 (90 Base) MCG/ACT inhaler Inhale 1-2 puffs into the lungs every 6 (six) hours as needed for wheezing or shortness of breath. 8 g 6  . atorvastatin (LIPITOR) 20 MG tablet Take 20 mg by mouth at bedtime.     Marland Kitchen  buPROPion (WELLBUTRIN XL) 300 MG 24 hr tablet Take 300 mg by mouth daily.    . calcium-vitamin D (OSCAL WITH D) 250-125 MG-UNIT per tablet Take 1 tablet by mouth daily.    . carbidopa-levodopa (SINEMET IR) 25-100 MG tablet Take 1.5 tablets by mouth 3 (three) times daily. 405 tablet 3  . Carbidopa-Levodopa ER (SINEMET CR) 25-100 MG tablet controlled release Take 1 tablet by mouth 2 (two) times daily. 180 tablet 3  . cetirizine (ZYRTEC) 10 MG tablet Take 10 mg by mouth daily.    Marland Kitchen donepezil (ARICEPT) 10 MG tablet Take 1 tablet (10 mg total) by mouth at bedtime. 90 tablet 3  . doxycycline (VIBRA-TABS) 100 MG tablet Take 1 tablet (100 mg total) by mouth 2 (two) times daily. 14 tablet 0  . doxycycline (VIBRA-TABS) 100 MG tablet Take 1 tablet (100 mg total) by mouth 2 (two) times daily. 14 tablet 0  . ferrous gluconate (FERGON) 324 MG tablet TAKE 1 TABLET BY MOUTH DAILY WITH BREAKFAST 90 tablet 3  . ferrous sulfate 325 (65 FE) MG tablet Take 325 mg by mouth 2 (two) times daily with a meal.     . Fluticasone-Umeclidin-Vilant (TRELEGY ELLIPTA) 100-62.5-25 MCG/INH AEPB Inhale 1 puff into the lungs daily. (Patient not taking: Reported on 06/18/2020) 60 each 5  . Green Tea 315 MG CAPS Take 315 mg by mouth daily.    . hydrochlorothiazide (HYDRODIURIL) 25 MG tablet Take 25 mg by mouth daily.    Marland Kitchen levalbuterol (XOPENEX) 0.31 MG/3ML nebulizer solution Take 3 mLs (0.31 mg total) by nebulization every 4 (four) hours as needed for wheezing. 540 mL 12  . MEGARED OMEGA-3 KRILL OIL PO Take 1 capsule by mouth daily.    . metFORMIN (GLUCOPHAGE) 500 MG tablet Take 1,000 mg by mouth 2 (two) times daily with a meal.     . mirtazapine (REMERON) 15 MG tablet Take 15 mg by mouth at bedtime.    . Multiple Vitamins-Minerals (MULTIVITAMIN WITH MINERALS) tablet Take 1 tablet by mouth daily.    Marland Kitchen omeprazole (PRILOSEC OTC) 20 MG tablet Take 20 mg by mouth daily with breakfast.    . sertraline (ZOLOFT) 100 MG tablet Take 150 mg by  mouth daily.    . vardenafil (LEVITRA)  20 MG tablet Take 20 mg by mouth daily as needed for erectile dysfunction.      No facility-administered medications prior to visit.    PAST MEDICAL HISTORY: Past Medical History:  Diagnosis Date  . Allergy    seasonal  . Blind left eye    legally  . Blood transfusion    2002  . COPD (chronic obstructive pulmonary disease) ([place])   . Depression   . Diabetes mellitus, type 2 ([place])   . Diverticulosis   . ED (erectile dysfunction)   . Glaucoma   . Hyperlipidemia   . Hypertension   . Insomnia   . Internal and external bleeding hemorrhoids   . Iron deficiency anemia   . OSA (obstructive sleep apnea)   . Osteoarthritis   . Rosacea   . Tremor of both hands   . Tubular adenoma of colon 03/2011    PAST SURGICAL HISTORY: Past Surgical History:  Procedure Laterality Date  . ARTHROSCOPIC REPAIR ACL    . CHOLECYSTECTOMY    . [place]   right  . COLONOSCOPY WITH PROPOFOL N/A 07/25/2015   Procedure: COLONOSCOPY WITH PROPOFOL;  Surgeon: [name], MD;  Location: [facility];  Service: Endoscopy;  Laterality: N/A;  . ESOPHAGOGASTRODUODENOSCOPY (EGD) WITH PROPOFOL N/A 07/25/2015   Procedure: ESOPHAGOGASTRODUODENOSCOPY (EGD) WITH PROPOFOL;  Surgeon: [name], MD;  Location: [facility];  Service: Endoscopy;  Laterality: N/A;  . KNEE ARTHROSCOPY  1996  . PARTIAL COLECTOMY  2008  . THYROID CYST EXCISION    . TONSILLECTOMY      FAMILY HISTORY: Family History  Problem Relation Age of Onset  . CAD Father   . Alcohol abuse Father   . Heart disease Father   . COPD Mother   . Asthma Mother   . Emphysema Mother   . Cancer Maternal Grandmother   . Colon cancer Neg Hx     SOCIAL HISTORY: Social History   Socioeconomic History  . Marital status: Married    Spouse name: Not on file  . Number of children: 1  . Years of education: HS  . Highest education level: Not on file  Occupational History  .  Occupation: retired    Fish farm manager: RETIRED  Tobacco Use  . Smoking status: Current Some Day Smoker    Packs/day: 1.50    Years: 50.00    Pack years: 75.00    Types: Cigarettes    Last attempt to quit: 04/29/2013    Years since quitting: 7.1  . Smokeless tobacco: Never Used  . Tobacco comment: Stateshe sometimes sneaks a cigarette  Substance and Sexual Activity  . Alcohol use: Yes    Alcohol/week: 0.0 standard drinks    Comment: once yearly  . Drug use: No  . Sexual activity: Not on file  Other Topics Concern  . Not on file  Social History Narrative   Drinks 1-2 [employer] a day    Social Determinants of Health   Financial Resource Strain: Not on file  Food Insecurity: Not on file  Transportation Needs: Not on file  Physical Activity: Not on file  Stress: Not on file  Social Connections: Not on file  Intimate Partner Violence: Not on file      PHYSICAL EXAM  There were no vitals filed for this visit. There is no height or weight on file to calculate BMI.  Generalized: Well developed, in no acute distress  Cardiology: normal rate and rhythm, no murmur noted Respiratory: clear  to auscultation bilaterally  Neck cir 18.75", Mallampati 3 Neurological examination  Mentation: Alert oriented to time, place, history taking. Follows all commands speech and language fluent Cranial nerve II-XII: Pupils were equal round reactive to light. Extraocular movements were full, visual field were full on confrontational test. Facial sensation and strength were normal. Uvula tongue midline. Head turning and shoulder shrug  were normal and symmetric. Motor: The motor testing reveals 5 over 5 strength of all 4 extremities. Good symmetric motor tone is noted throughout. Mild left hand tremor Sensory: Sensory testing is intact to soft touch on all 4 extremities. No evidence of extinction is noted.  Coordination: Cerebellar testing reveals good finger-nose-finger and heel-to-shin bilaterally.  Gait and  station: Gait is short but stable without assistive device     DIAGNOSTIC DATA (LABS, IMAGING, TESTING) - I reviewed patient records, labs, notes, testing and imaging myself where available.  MMSE - Mini Mental State Exam 06/01/2019 03/03/2019 11/29/2018  Orientation to time _0 Orientation to Place _1 Registration _2 Attention/ Calculation _3 Recall _4 Language- name 2 objects _5 Language- repeat _6 Language- follow 3 step command _7 Language- read & follow direction _8 Write a sentence _9 Write a sentence-comments - - drew the clock 10 past 11  Copy design _10 Copy design-comments 8 animals 12 animals named named 11 animals  Total score _11 Lab Results  Component Value Date   WBC 9.1 04/17/2020   HGB 11.1 (L) 04/17/2020   HCT 37.4 (L) 04/17/2020   MCV 77.4 (L) 04/17/2020   PLT 308 04/17/2020      Component Value Date/Time   NA 141 03/18/2013 1422   K 3.9 03/18/2013 1422   CL 102 03/18/2013 1422   CO2 33 (H) 03/18/2013 1422   GLUCOSE 130 (H) 03/18/2013 1422   BUN 14 03/18/2013 1422   CREATININE 1.1 03/18/2013 1422   CALCIUM 9.4 03/18/2013 1422   GFRNONAA >60 02/08/2008 1431   GFRAA  02/08/2008 1431    >60        The eGFR has been calculated using the MDRD equation. This calculation has not been validated in all clinical   No results found for: CHOL, HDL, LDLCALC, LDLDIRECT, TRIG, CHOLHDL Lab Results  Component Value Date   HGBA1C  11/24/2006    5.6 (NOTE)   The ADA recommends the following therapeutic goals for glycemic   control related to Hgb A1C measurement:   Goal of Therapy:   < 7.0% Hgb A1C   Action Suggested:  > 8.0% Hgb A1C   Ref:  Diabetes Care, 56, Suppl. 1, 1999   Lab Results  Component Value Date   OZYYQMGN00 370 06/15/2012   No results found for: TSH   ASSESSMENT AND PLAN 75 y.o. year old male  has a past medical history of Allergy, Blind left eye, Blood transfusion, COPD (chronic obstructive  pulmonary disease) ([place]), Depression, Diabetes mellitus, type 2 ([place]), Diverticulosis, ED (erectile dysfunction), Glaucoma, Hyperlipidemia, Hypertension, Insomnia, Internal and external bleeding hemorrhoids, Iron deficiency anemia, OSA (obstructive sleep apnea), Osteoarthritis, Rosacea, Tremor of both hands, and Tubular adenoma of colon (03/2011). here with     ICD-10-CM   1. Parkinson's disease ([place])  G20   2. Memory loss  R41.3      Mr. Handlin reports  that he is doing better, overall.  He is tolerating Sinemet IR and Sinemet CR well and without obvious adverse effects.  We will continue Sinemet IR 1.5 tablets 3 times daily and Sinemet CR twice daily.  We will increase Aricept to 10 mg daily as he does feel that it could be helping with his memory.  MMSE has improved.  He does endorse concerns of sleep apnea today.  We will refer him for consideration of a sleep study.  I have provided information on sleep hygiene.  He was encouraged to stay rigid, work on healthy lifestyle habits with well-balanced diet and regular exercise.  All precautions reviewed.  He will follow-up closely with primary care for blood pressure management as well as depression and anxiety management.  He feels that symptoms are stable today.  He will follow-up with me pending sleep evaluation.  He verbalizes understanding and agreement with this plan.   No orders of the defined types were placed in this encounter.    No orders of the defined types were placed in this encounter.      Debbora Presto, FNP-C 06/25/2020, 4:04 PM [facility] [address], [place], [place] [address] [phone]

## 2020-06-26 ENCOUNTER — Ambulatory Visit: Payer: Medicare Other | Admitting: Family Medicine

## 2020-06-26 DIAGNOSIS — E119 Type 2 diabetes mellitus without complications: Secondary | ICD-10-CM | POA: Diagnosis not present

## 2020-06-26 DIAGNOSIS — Z961 Presence of intraocular lens: Secondary | ICD-10-CM | POA: Diagnosis not present

## 2020-06-26 DIAGNOSIS — H53433 Sector or arcuate defects, bilateral: Secondary | ICD-10-CM | POA: Diagnosis not present

## 2020-06-26 DIAGNOSIS — H401133 Primary open-angle glaucoma, bilateral, severe stage: Secondary | ICD-10-CM | POA: Diagnosis not present

## 2020-06-27 ENCOUNTER — Telehealth (HOSPITAL_COMMUNITY): Payer: Self-pay

## 2020-06-27 NOTE — Telephone Encounter (Signed)
Called patient to see if he is interested in the Pulmonary Rehab Program. Patient expressed interest. Explained scheduling process and went over insurance, patient verbalized understanding. Also adv pt where we are with scheduling for PR and that we have a back log. (1-4 months) 

## 2020-06-27 NOTE — Telephone Encounter (Signed)
Pt insurance is active and benefits verified through Medicare A/B. Co-pay $0.00, DED $233.00/$233.00 met, out of pocket $0.00/$0.00 met, co-insurance 20%. No pre-authorization required. 06/27/20 @ 409PM  2ndary insurance is active and benefits verified through [employer]. Co-pay $0.00, DED $0.00/$0.00 met, out of pocket $0.00/$0.00 met, co-insurance 0%. No pre-authorization required. 06/27/20 @ 409PM  Will contact patient to see if he is interested in the Pulmonary Rehab Program.

## 2020-07-16 ENCOUNTER — Encounter: Payer: Self-pay | Admitting: Hematology and Oncology

## 2020-09-11 ENCOUNTER — Ambulatory Visit: Payer: Medicare Other | Admitting: Internal Medicine

## 2020-09-12 ENCOUNTER — Telehealth (HOSPITAL_COMMUNITY): Payer: Self-pay

## 2020-09-12 ENCOUNTER — Other Ambulatory Visit: Payer: Self-pay | Admitting: Pulmonary Disease

## 2020-09-12 ENCOUNTER — Other Ambulatory Visit: Payer: Self-pay

## 2020-09-12 ENCOUNTER — Encounter (HOSPITAL_COMMUNITY): Payer: Self-pay

## 2020-09-12 ENCOUNTER — Ambulatory Visit (INDEPENDENT_AMBULATORY_CARE_PROVIDER_SITE_OTHER): Payer: Medicare Other | Admitting: Pulmonary Disease

## 2020-09-12 ENCOUNTER — Ambulatory Visit (INDEPENDENT_AMBULATORY_CARE_PROVIDER_SITE_OTHER): Payer: Medicare Other

## 2020-09-12 ENCOUNTER — Encounter: Payer: Self-pay | Admitting: Pulmonary Disease

## 2020-09-12 ENCOUNTER — Encounter: Payer: Self-pay | Admitting: Hematology and Oncology

## 2020-09-12 VITALS — BP 124/62 | HR 100 | Ht 73.0 in | Wt 210.4 lb

## 2020-09-12 DIAGNOSIS — J432 Centrilobular emphysema: Secondary | ICD-10-CM | POA: Diagnosis not present

## 2020-09-12 DIAGNOSIS — J9811 Atelectasis: Secondary | ICD-10-CM | POA: Diagnosis not present

## 2020-09-12 DIAGNOSIS — J441 Chronic obstructive pulmonary disease with (acute) exacerbation: Secondary | ICD-10-CM

## 2020-09-12 DIAGNOSIS — J439 Emphysema, unspecified: Secondary | ICD-10-CM | POA: Diagnosis not present

## 2020-09-12 DIAGNOSIS — J9611 Chronic respiratory failure with hypoxia: Secondary | ICD-10-CM | POA: Diagnosis not present

## 2020-09-12 DIAGNOSIS — J90 Pleural effusion, not elsewhere classified: Secondary | ICD-10-CM | POA: Diagnosis not present

## 2020-09-12 DIAGNOSIS — R042 Hemoptysis: Secondary | ICD-10-CM | POA: Diagnosis not present

## 2020-09-12 DIAGNOSIS — J449 Chronic obstructive pulmonary disease, unspecified: Secondary | ICD-10-CM | POA: Diagnosis not present

## 2020-09-12 IMAGING — DX DG CHEST 2V
2 series · 2 of 2 positions shown · non-contrast
Comparison: [DATE]

Correlation: CT chest [DATE]

CLINICAL DATA: Hemoptysis, COPD exacerbation

EXAM:
CHEST - 2 VIEW

[chest pa]
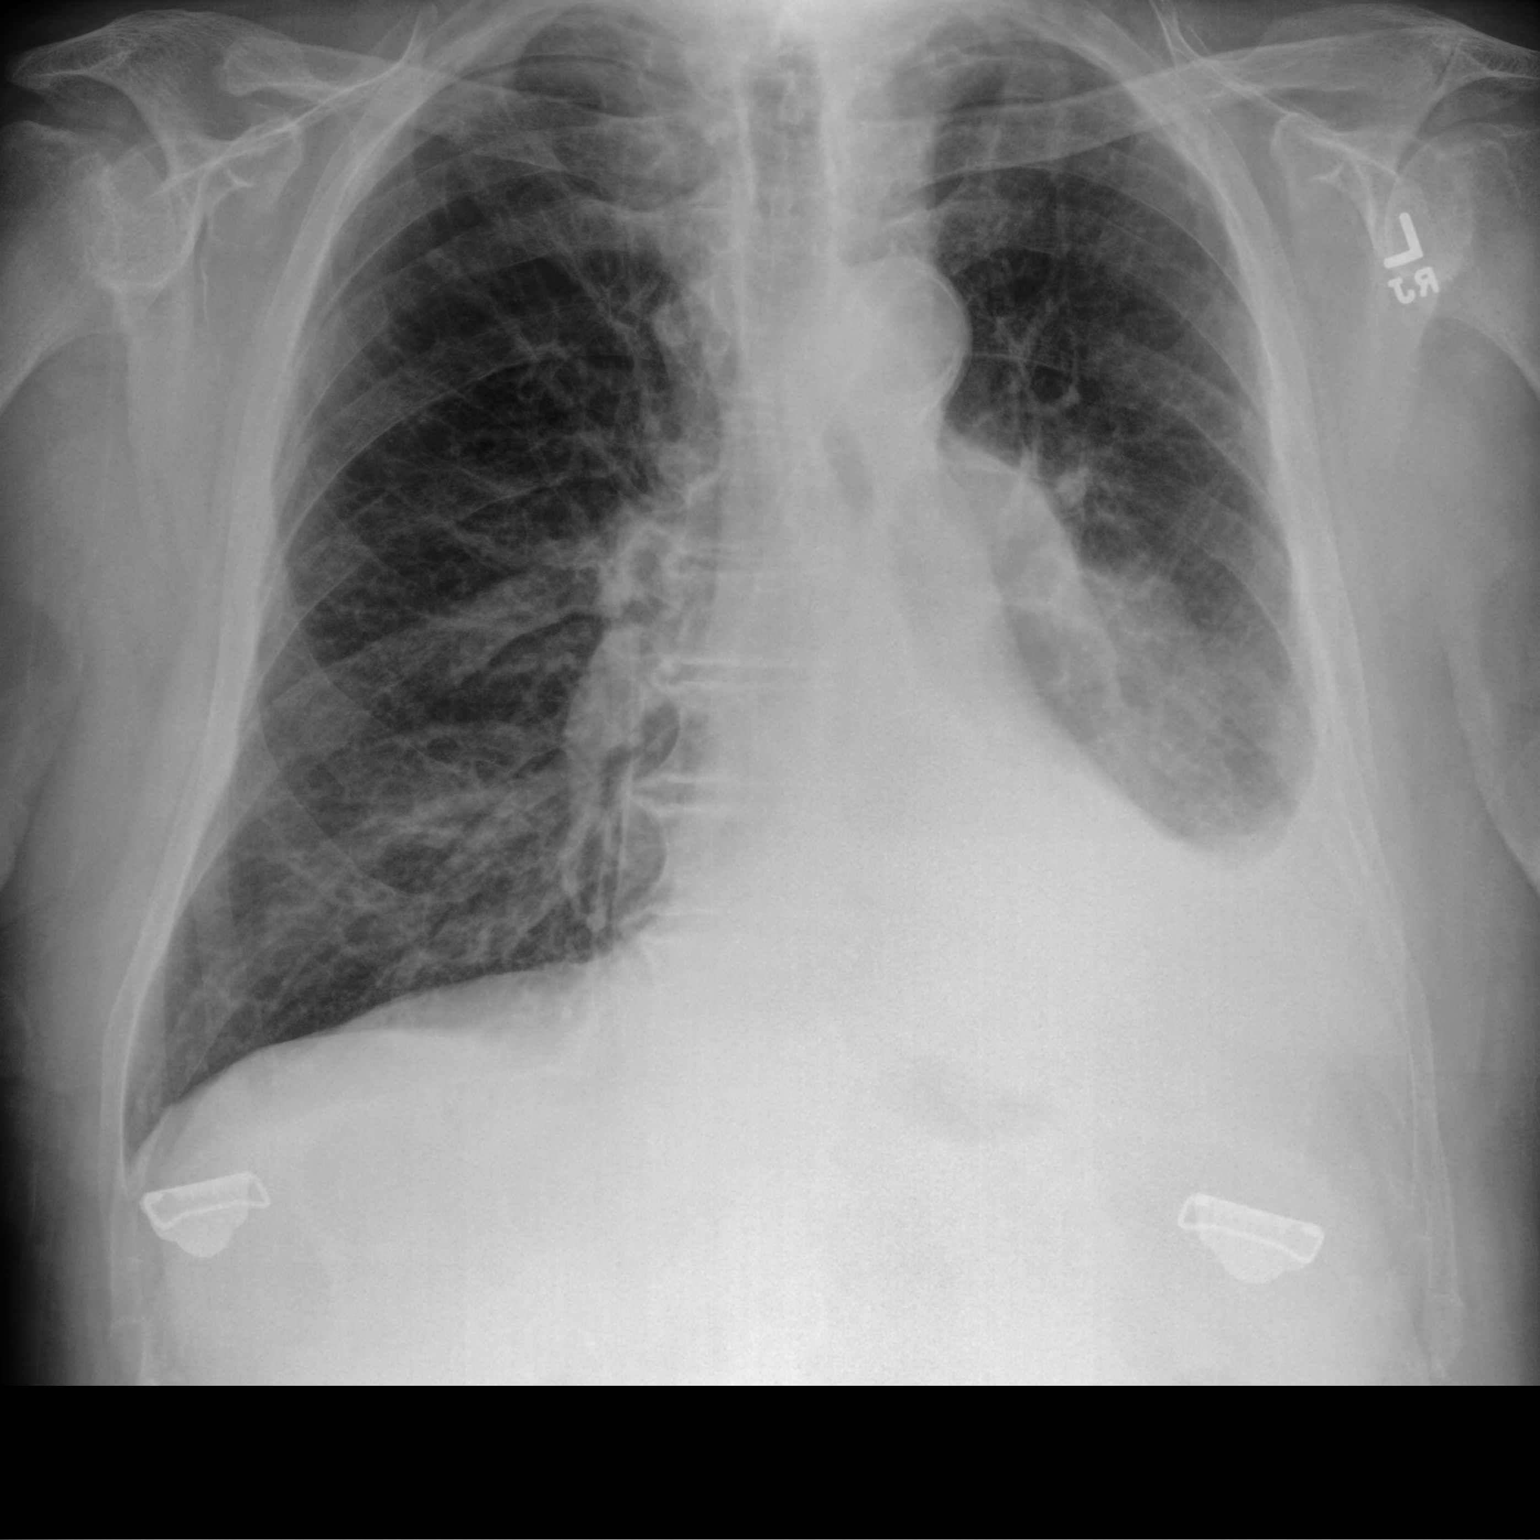

[chest lat]
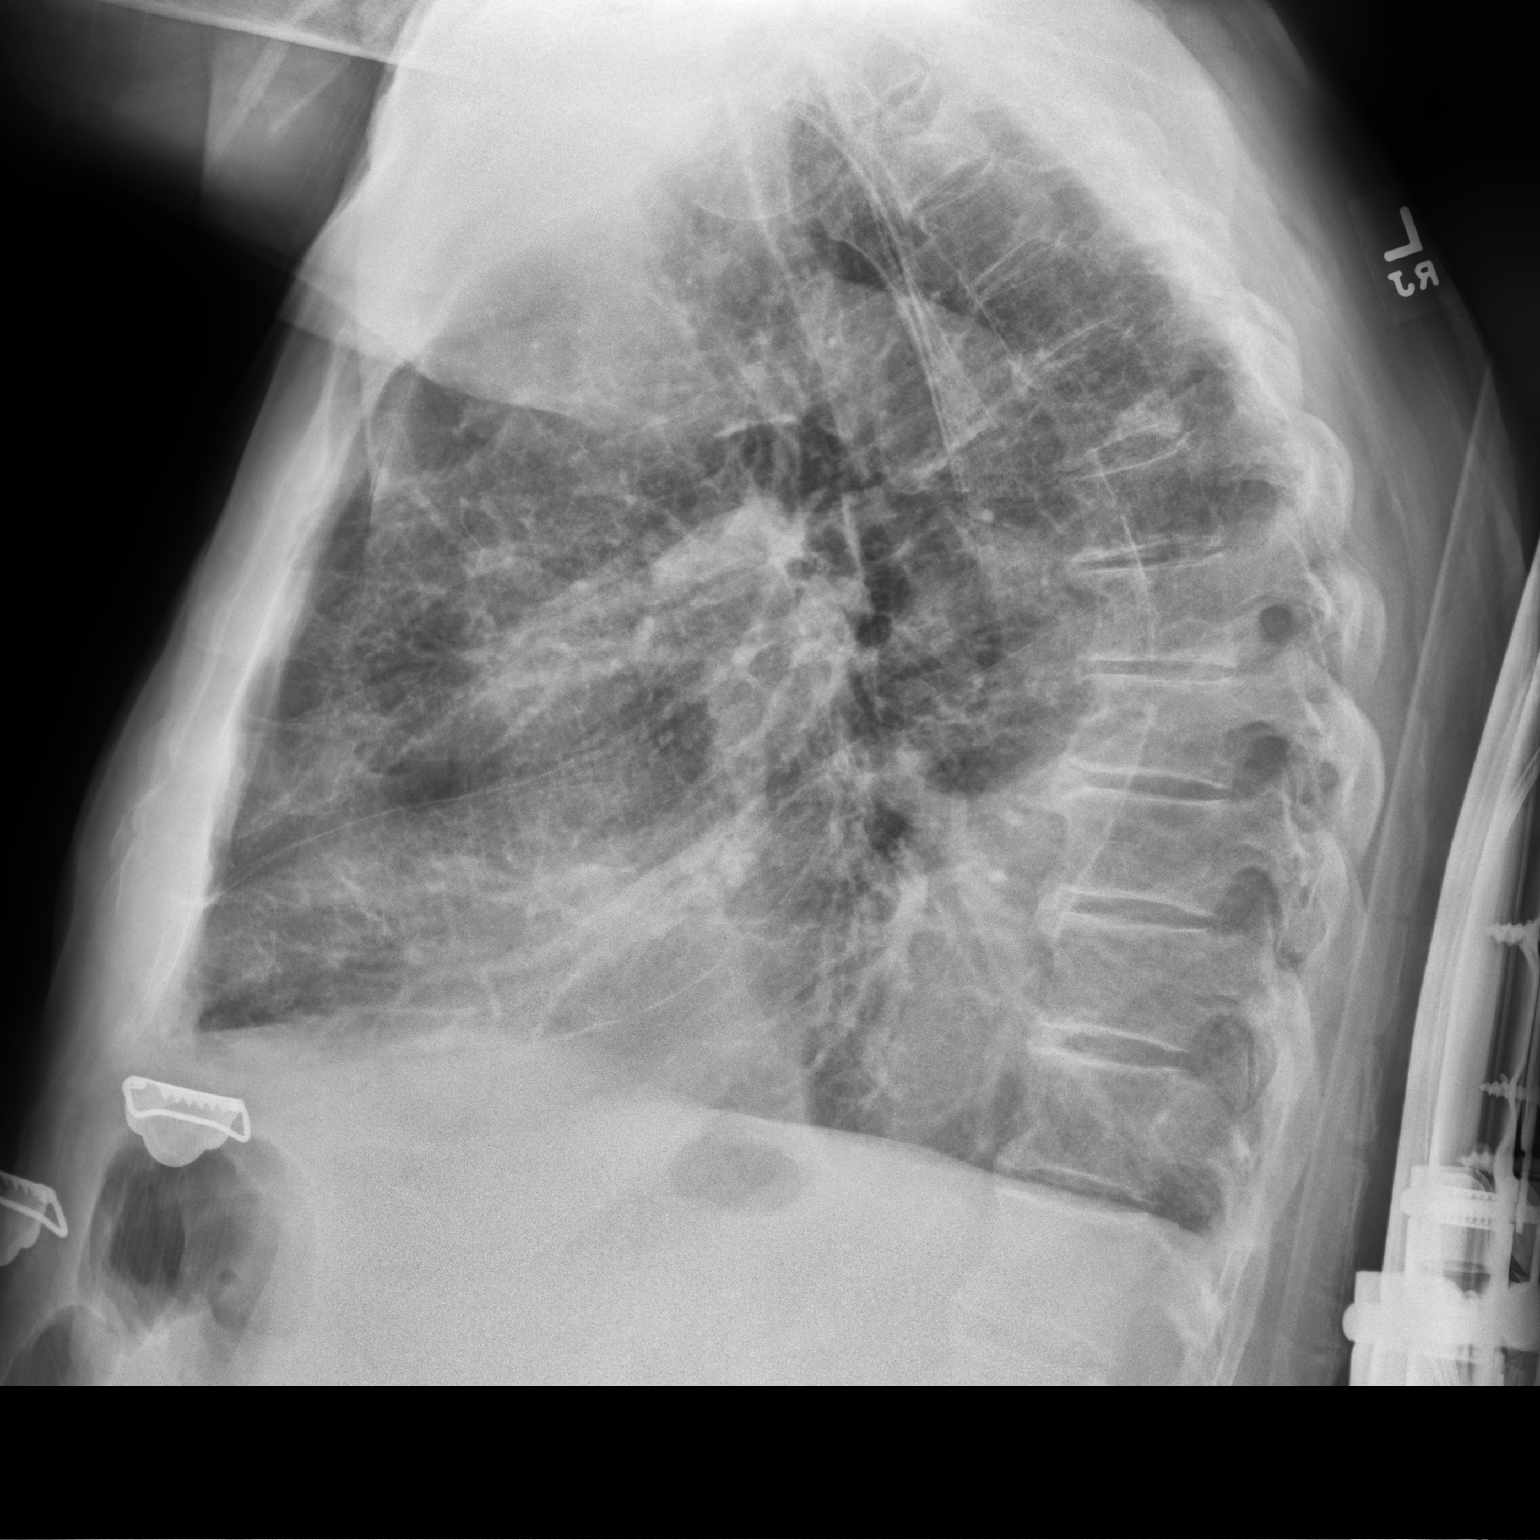

[2 of 2 positions shown; findings below may reference images not displayed]

FINDINGS: Normal heart size, mediastinal contours, and pulmonary vascularity.

Atherosclerotic calcification aorta.

Small to moderate LEFT pleural effusion with LEFT basilar
atelectasis.

Coexisting consolidation not excluded.

Underlying emphysematous changes with minimal RIGHT basilar
atelectasis.

No pneumothorax or acute osseous findings.
IMPRESSION: COPD changes with new LEFT pleural effusion and basilar atelectasis,
cannot exclude coexisting consolidation in LEFT lower lobe.

Aortic Atherosclerosis ([Q1]-[Q1]) and Emphysema ([Q1]-[Q1]).

## 2020-09-12 MED ORDER — PREDNISONE 10 MG PO TABS: ORAL_TABLET | ORAL | 0 refills | Status: AC

## 2020-09-12 MED ORDER — BUDESONIDE 0.5 MG/2ML IN SUSP: 0.5000 mg | Freq: Two times a day (BID) | RESPIRATORY_TRACT | 5 refills | Status: DC

## 2020-09-12 MED ORDER — AMOXICILLIN-POT CLAVULANATE 875-125 MG PO TABS: 1.0000 | ORAL_TABLET | Freq: Two times a day (BID) | ORAL | 0 refills | Status: DC

## 2020-09-12 MED ORDER — FORMOTEROL FUMARATE 20 MCG/2ML IN NEBU: 20.0000 ug | INHALATION_SOLUTION | Freq: Two times a day (BID) | RESPIRATORY_TRACT | 5 refills | Status: DC

## 2020-09-12 MED ORDER — REVEFENACIN 175 MCG/3ML IN SOLN: 175.0000 ug | Freq: Every day | RESPIRATORY_TRACT | 5 refills | Status: DC

## 2020-09-12 NOTE — Progress Notes (Signed)
[facility]  Chief Complaint  Patient presents with   Follow-up    Patient has short of breath sitting and at rest.     Constitutional:  BP 124/62 (BP Location: Left Arm, Patient Position: Sitting, Cuff Size: Normal)   Pulse 100   Ht 6\' 1"  (1.854 m)   Wt 210 lb 6.4 oz (95.4 kg)   SpO2 90%   BMI 27.76 kg/m   Past Medical History:  Blind Lt eye, Depression, DM type 2, Diverticulosis, ED, Glaucoma, HLD, HTN, Anemia, OA, Parkinson disease, Rosacea, Colon polyps  Past Surgical History:  He  has a past surgical history that includes Partial colectomy (2008); Clavicle surgery ([place]); Knee arthroscopy (1996); Cholecystectomy; Tonsillectomy; Thyroid cyst excision; Arthroscopic repair ACL; Esophagogastroduodenoscopy (egd) with propofol (N/A, 07/25/2015); and Colonoscopy with propofol (N/A, 07/25/2015).  Brief Summary:  James [name] is a 75 y.o. 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      Subjective:   He is here with his wife.  I last saw him in 2019.  More recently seen by Eric Form.  He has been losing weight.  Not much of an appetite.  Has trouble with tremor and gait from Parkinson's disease.  Has trouble sleeping due to pain and stiffness, and due to cough.  Has been feeling more chest congestion and wheeze over past two weeks.  Has been coughing up blood - last was about one week ago.  No fever.  He doesn't feel like he can inhale deep enough to use trelegy effectively.  He is using 3 liters oxygen 24/7.  Physical Exam:   Appearance - wearing oxygen, sitting in wheelchair  ENMT - no sinus tenderness, no oral exudate, no LAN, Mallampati 3 airway, no stridor  Respiratory - b/l wheeze, prolonged exhalation  CV - s1s2 regular rate and rhythm, no murmurs  Ext - no clubbing, no edema  Skin - no rashes  Psych - normal mood and affect   Pulmonary testing:   Spirometry 06/08/12 >> FEV1 1.91 (49%), FEV1% 59 Ambulatory oximetry on room air 07/07/12 >> SpO2 86%  A1AT 07/07/12 >> 154, PI-MM PFT 05/02/20 >> FEV1 2.04 (58%), FEV1% 55, TLC 7.84 (102%), DLCO 41%  Chest Imaging:  CT chest 03/23/13 >> mild/diffuse centrilobular emphysema, 4 mm nodule RLL superior segment, 4 mm nodule LLL CT chest 02/20/14 >> moderate centrilobular/paraseptal emphysema, mild diffuse bronchial wall thickening, 3 mm nodule RLL and 5 mm nodule LLL no change CT chest 02/13/15 >> new 1.5 cm nodule RLL. CT chest 05/28/15 >> increased RLL irregular nodule to 2 cm PET scan 06/14/15 >> 1.93 SUV RLL 11 mm lesion CT chest 11/13/15 >> no progression CT chest 05/28/16 >> no change in lung nodule CT chest 06/01/17 >> no change CT chest 05/10/20 >> mod/severe centrilobular emphysema, bronchial thickening, no change in 1.2 x 0.7 cm RLL nodule, no change 4 mm LLL nodule and 3 mm RLL nodule  Sleep Tests:  PSG 01/25/08 >> RDI 6.8, SpO2 low 84%.  Spent 79% of test time with SpO2 < 90% ONO with RA 06/16/12 >> Test time 9 hrs 21 min.  Mean SpO2 89%, low SpO2 63%.  Spent 2 hrs 25 min with SpO2 < 88% PSG 07/13/19 >> AHI 3.9, SpO2 low 83%  Cardiac Tests:  Echo 06/06/20 >> EF 60 to 65%, grade 1 DD, aortic root 45 mm  Social History:  He  reports that he quit  smoking about 5 years ago. His smoking use included cigarettes. He has a 75.00 pack-year smoking history. He has never used smokeless tobacco. He reports current alcohol use. He reports that he does not use drugs.  Family History:  His family history includes Alcohol abuse in his father; Asthma in his mother; CAD in his father; COPD in his mother; Cancer in his maternal grandmother; Emphysema in his mother; Heart disease in his father.     Assessment/Plan:   COPD with emphysema. - has acute exacerbation with hemoptysis - will give course of prednisone and augmentin - will arrange for chest xray - don't  think he can effectively use inhalers -  will strop trelegy - transition to pulmicort, perforomist and yupelri - prn xopenex   Chronic respiratory failure from COPD. - goal SpO2 > 90% - continue 3 liters 24/7   Right lower lung mass. - stable on serial CT imaging - no additional radiographic follow up needed   Parkinson's disease. - followed by Dr. Star Age with [facility]  Time Spent Involved in Patient Care on Day of Examination:  33 minutes  Follow up:   Patient Instructions  Augmentin antibiotic twice per day for 7 days Prednisone 10 mg pill >> 3 pills daily for 2 days, 2 pills daily for 2 days, 1 pill daily for 2 days Budesonide one vial nebulized in the morning and evening, and rinse your mouth after each use Perforomist one vial nebulized in the morning and evening Yupelri one vial nebulized daily Stop using trelegy once you get new nebulizer medicines Chest xray today  Follow up in 4 weeks  Medication List:   Allergies as of 09/12/2020       Reactions   Aspirin Other (See Comments)   Codeine Itching   Morphine Itching        Medication List        Accurate as of September 12, 2020 12:50 PM. If you have any questions, ask your nurse or doctor.          STOP taking these medications    doxycycline 100 MG tablet Commonly known as: VIBRA-TABS Stopped by: Chesley Mires, MD   Trelegy Ellipta 100-62.5-25 MCG/INH Aepb Generic drug: Fluticasone-Umeclidin-Vilant Stopped by: Chesley Mires, MD       TAKE these medications    albuterol 108 (90 Base) MCG/ACT inhaler Commonly known as: VENTOLIN HFA Inhale 1-2 puffs into the lungs every 6 (six) hours as needed for wheezing or shortness of breath.   amoxicillin-clavulanate 875-125 MG tablet Commonly known as: AUGMENTIN Take 1 tablet by mouth 2 (two) times daily. Started by: Chesley Mires, MD   atorvastatin 20 MG tablet Commonly known as: LIPITOR Take 20 mg by mouth at bedtime.   budesonide 0.5 MG/2ML nebulizer solution Commonly known  as: PULMICORT Take 2 mLs (0.5 mg total) by nebulization 2 (two) times daily. Started by: Chesley Mires, MD   buPROPion 300 MG 24 hr tablet Commonly known as: WELLBUTRIN XL Take 300 mg by mouth daily.   calcium-vitamin D 250-125 MG-UNIT tablet Commonly known as: OSCAL WITH D Take 1 tablet by mouth daily.   carbidopa-levodopa 25-100 MG tablet Commonly known as: SINEMET IR Take 1.5 tablets by mouth 3 (three) times daily.   Carbidopa-Levodopa ER 25-100 MG tablet controlled release Commonly known as: SINEMET CR Take 1 tablet by mouth 2 (two) times daily.   cetirizine 10 MG tablet Commonly known as: ZYRTEC Take 10 mg by mouth daily.   donepezil 10 MG  tablet Commonly known as: ARICEPT Take 1 tablet (10 mg total) by mouth at bedtime.   ferrous gluconate 324 MG tablet Commonly known as: FERGON TAKE 1 TABLET BY MOUTH DAILY WITH BREAKFAST   ferrous sulfate 325 (65 FE) MG tablet Take 325 mg by mouth 2 (two) times daily with a meal.   formoterol 20 MCG/2ML nebulizer solution Commonly known as: PERFOROMIST Take 2 mLs (20 mcg total) by nebulization 2 (two) times daily. Started by: Chesley Mires, MD   Green Tea 315 MG Caps Take 315 mg by mouth daily.   hydrochlorothiazide 25 MG tablet Commonly known as: HYDRODIURIL Take 25 mg by mouth daily.   levalbuterol 0.31 MG/3ML nebulizer solution Commonly known as: Xopenex Take 3 mLs (0.31 mg total) by nebulization every 4 (four) hours as needed for wheezing.   MEGARED OMEGA-3 KRILL OIL PO Take 1 capsule by mouth daily.   metFORMIN 500 MG tablet Commonly known as: GLUCOPHAGE Take 1,000 mg by mouth 2 (two) times daily with a meal.   mirtazapine 15 MG tablet Commonly known as: REMERON Take 15 mg by mouth at bedtime.   multivitamin with minerals tablet Take 1 tablet by mouth daily.   omeprazole 20 MG tablet Commonly known as: PRILOSEC OTC Take 20 mg by mouth daily with breakfast.   predniSONE 10 MG tablet Commonly known as:  DELTASONE Take 3 tablets (30 mg total) by mouth daily with breakfast for 2 days, THEN 2 tablets (20 mg total) daily with breakfast for 2 days, THEN 1 tablet (10 mg total) daily with breakfast for 2 days. Start taking on: September 12, 2020 Started by: Chesley Mires, MD   revefenacin 175 MCG/3ML nebulizer solution Commonly known as: YUPELRI Take 3 mLs (175 mcg total) by nebulization daily. Started by: Chesley Mires, MD   sertraline 100 MG tablet Commonly known as: ZOLOFT Take 150 mg by mouth daily.   vardenafil 20 MG tablet Commonly known as: LEVITRA Take 20 mg by mouth daily as needed for erectile dysfunction.        Signature:  Chesley Mires, MD [place] Pager - [phone] 09/12/2020, 12:50 PM

## 2020-09-12 NOTE — Patient Instructions (Signed)
Augmentin antibiotic twice per day for 7 days Prednisone 10 mg pill >> 3 pills daily for 2 days, 2 pills daily for 2 days, 1 pill daily for 2 days Budesonide one vial nebulized in the morning and evening, and rinse your mouth after each use Perforomist one vial nebulized in the morning and evening Yupelri one vial nebulized daily Stop using trelegy once you get new nebulizer medicines Chest xray today  Follow up in 4 weeks

## 2020-09-12 NOTE — Telephone Encounter (Signed)
Attempted to contact pt in regards to Pulmonary Rehab. LM on VM  Mailed letter 

## 2020-09-13 ENCOUNTER — Telehealth: Payer: Self-pay | Admitting: Neurology

## 2020-09-13 ENCOUNTER — Telehealth (HOSPITAL_COMMUNITY): Payer: Self-pay

## 2020-09-13 ENCOUNTER — Telehealth: Payer: Self-pay | Admitting: Pulmonary Disease

## 2020-09-13 DIAGNOSIS — R296 Repeated falls: Secondary | ICD-10-CM

## 2020-09-13 DIAGNOSIS — Z9181 History of falling: Secondary | ICD-10-CM

## 2020-09-13 DIAGNOSIS — G20 Parkinson's disease: Secondary | ICD-10-CM

## 2020-09-13 NOTE — Telephone Encounter (Signed)
Pt's wife, Carrie Schoonmaker (on [place]) called, for two months his Parkinson's has been getting worse; he be falling. I have been trying to settle it by myself. Would like a call from the nurse to discuss what I can do.

## 2020-09-13 NOTE — Telephone Encounter (Signed)
Pt wife [name] returned PR phone call, Violet Baldy Aug. PR schedule is now full. If someone cancels for Aug I will contact pt, if not he will be called at a later date to get schedule for Sep.

## 2020-09-13 NOTE — Telephone Encounter (Signed)
I called pt's wife. She was emotional on the phone, she states the pt has not been doing well. Reports pulm recently d/x with Acute COPD exacerbation and has been placed on Antibiotics.  PD seems to be progressing, has been falling down more. Wife is requesting referral be placed for physical therapy in the home to be placed for Evaluation and possbile treatment for strength/gait training. I advised Dr. [name] is out of the office but will be back on 09/24/20 and we could have her review. She was agreeable to waiting for Dr. [name] to review on Monday.

## 2020-09-13 NOTE — Telephone Encounter (Signed)
LMTCB Need to see if she is okay with [place] sending to [facility]

## 2020-09-14 ENCOUNTER — Other Ambulatory Visit: Payer: Self-pay

## 2020-09-14 ENCOUNTER — Telehealth: Payer: Self-pay | Admitting: Pulmonary Disease

## 2020-09-14 ENCOUNTER — Telehealth: Payer: Self-pay

## 2020-09-14 DIAGNOSIS — J9611 Chronic respiratory failure with hypoxia: Secondary | ICD-10-CM

## 2020-09-14 DIAGNOSIS — J90 Pleural effusion, not elsewhere classified: Secondary | ICD-10-CM

## 2020-09-14 MED ORDER — FORMOTEROL FUMARATE 20 MCG/2ML IN NEBU: 20.0000 ug | INHALATION_SOLUTION | Freq: Two times a day (BID) | RESPIRATORY_TRACT | 5 refills | Status: DC

## 2020-09-14 MED ORDER — REVEFENACIN 175 MCG/3ML IN SOLN: 175.0000 ug | Freq: Every day | RESPIRATORY_TRACT | 5 refills | Status: DC

## 2020-09-14 NOTE — Telephone Encounter (Signed)
-----   Message from Chesley Mires, MD sent at 09/13/2020 12:51 PM EDT ----- Please let him know that his chest xray shows he has new fluid build up around the outside of his left lung.  He needs to have fluid drained to send for testing (thoracentesis).  If he is agreeable to this, then please arrange for referral to interventional radiology to do left thoracentesis.  Fluid sample will need to be sent for glucose, protein, LDH, cell count with differential, gram stain with culture, and cytology.

## 2020-09-14 NOTE — Telephone Encounter (Signed)
Spoke with Gerald Stabs at [facility] who stated [facility] order needed to be clarified. Clarified and sent order back to [facility]. Nothing further needed at this time.

## 2020-09-14 NOTE — Telephone Encounter (Signed)
Spoke with patients wife and she is ok to have medication sent to [facility].  Orders sent.

## 2020-09-14 NOTE — Telephone Encounter (Signed)
Patient aware of results and recommendations.  Orders placed.

## 2020-09-17 ENCOUNTER — Other Ambulatory Visit (HOSPITAL_COMMUNITY): Payer: Self-pay | Admitting: Student

## 2020-09-17 ENCOUNTER — Encounter: Payer: Self-pay | Admitting: Student

## 2020-09-17 ENCOUNTER — Ambulatory Visit (HOSPITAL_COMMUNITY)
Admission: RE | Admit: 2020-09-17 | Discharge: 2020-09-17 | Disposition: A | Discharge status: Home / Self Care | Payer: Medicare Other | Source: Ambulatory Visit | Attending: Student | Admitting: Student

## 2020-09-17 ENCOUNTER — Other Ambulatory Visit: Payer: Self-pay

## 2020-09-17 ENCOUNTER — Ambulatory Visit (HOSPITAL_COMMUNITY)
Admission: RE | Admit: 2020-09-17 | Discharge: 2020-09-17 | Disposition: A | Discharge status: Home / Self Care | Payer: Medicare Other | Source: Ambulatory Visit | Attending: Pulmonary Disease | Admitting: Pulmonary Disease

## 2020-09-17 ENCOUNTER — Telehealth: Payer: Self-pay | Admitting: Pulmonary Disease

## 2020-09-17 DIAGNOSIS — J90 Pleural effusion, not elsewhere classified: Secondary | ICD-10-CM

## 2020-09-17 DIAGNOSIS — J439 Emphysema, unspecified: Secondary | ICD-10-CM | POA: Diagnosis not present

## 2020-09-17 HISTORY — PX: IR THORACENTESIS ASP PLEURAL SPACE W/IMG GUIDE: IMG5380

## 2020-09-17 LAB — GRAM STAIN

## 2020-09-17 LAB — PROTEIN, PLEURAL OR PERITONEAL FLUID: Total protein, fluid: 4.3 g/dL

## 2020-09-17 LAB — BODY FLUID CELL COUNT WITH DIFFERENTIAL
Eos, Fluid: 5 %
Lymphs, Fluid: 73 %
Monocyte-Macrophage-Serous Fluid: 5 % — ABNORMAL LOW (ref 50–90)
Neutrophil Count, Fluid: 17 % (ref 0–25)
Total Nucleated Cell Count, Fluid: 1490 cu mm — ABNORMAL HIGH (ref 0–1000)

## 2020-09-17 LAB — LACTATE DEHYDROGENASE, PLEURAL OR PERITONEAL FLUID: LD, Fluid: 147 U/L — ABNORMAL HIGH (ref 3–23)

## 2020-09-17 LAB — GLUCOSE, PLEURAL OR PERITONEAL FLUID: Glucose, Fluid: 117 mg/dL

## 2020-09-17 IMAGING — CR DG CHEST 1V
1 series · 1 of 1 positions shown · non-contrast
Comparison: [DATE]

CLINICAL DATA: Pleural effusion.  Status post left thoracentesis.

EXAM:
CHEST  1 VIEW

[chest ap]
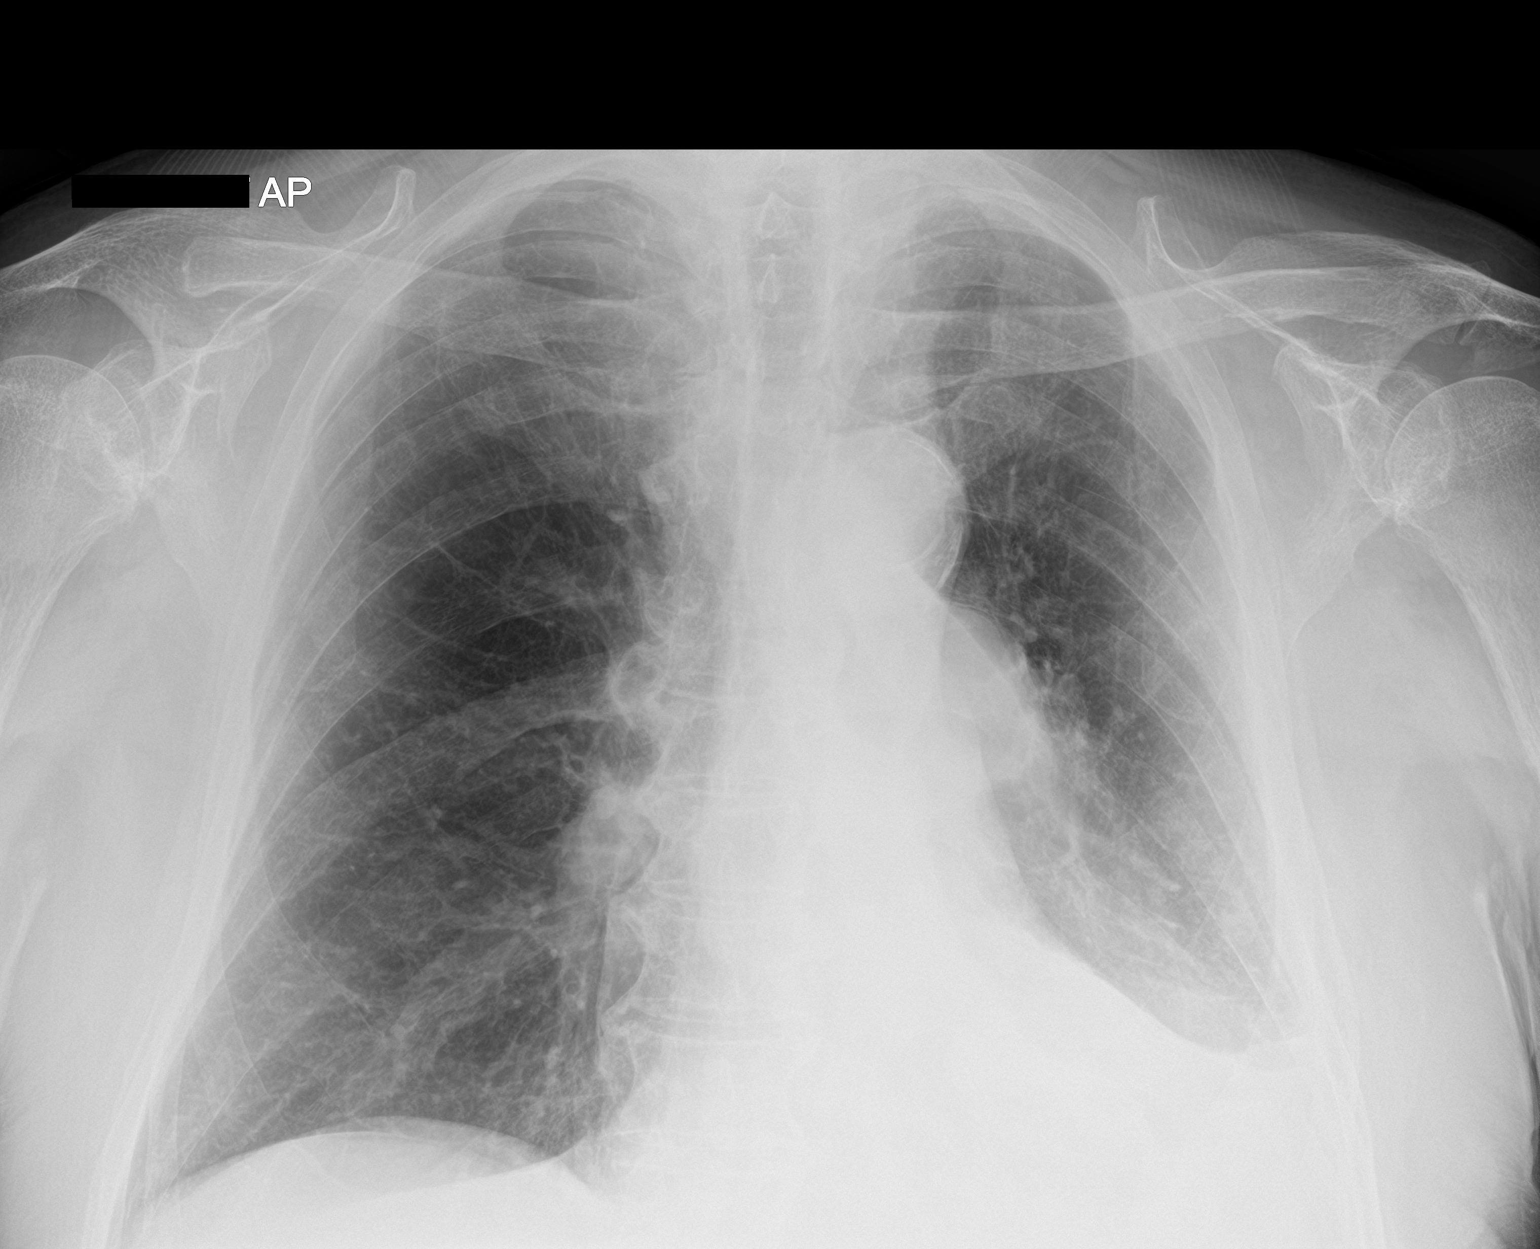

[1 of 1 positions shown; findings below may reference images not displayed]

FINDINGS: The cardiomediastinal silhouette is unchanged with normal heart
size. Aortic atherosclerosis is noted. There is underlying
emphysema. There is a small residual left pleural effusion with
associated left basilar atelectasis or consolidation. The right lung
remains clear. No pneumothorax is identified. No acute osseous
abnormality is seen.
IMPRESSION: Small residual left pleural effusion with left basilar atelectasis
or consolidation. No pneumothorax.

## 2020-09-17 IMAGING — US IR THORACENTESIS ASP PLEURAL SPACE W/IMG GUIDE
1 series · 2 of 2 positions shown · non-contrast
Comparison: none

INDICATION: Patient with history of COPD, emphysema, now with left pleural
effusion. Request is made for diagnostic and therapeutic left
thoracentesis.

[Series 1: ir (id) (id)/(id)/(id) ir · 2 of 2 slices shown]
[im 1/2]
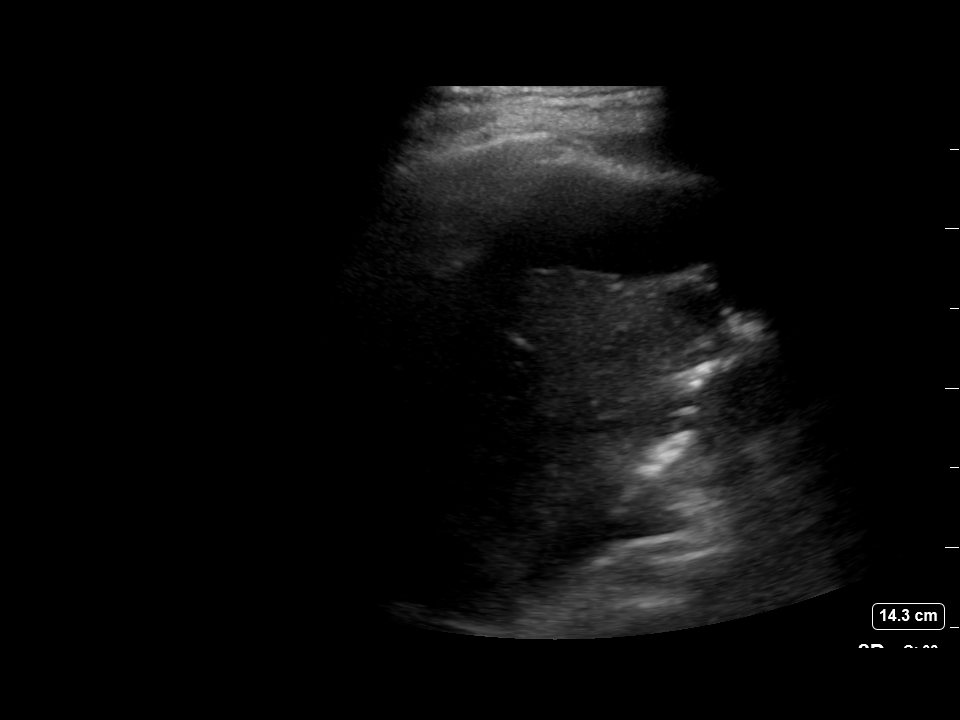
[im 2/2]
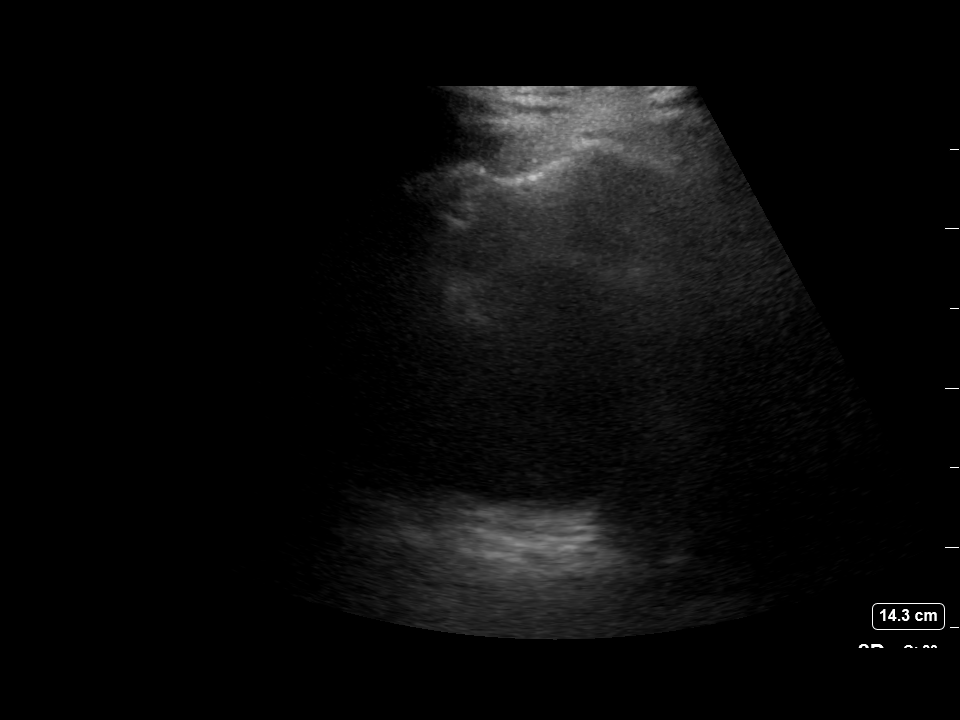

[2 of 2 positions shown; findings below may reference images not displayed]

EXAM:
ULTRASOUND GUIDED DIAGNOSTIC AND THERAPEUTIC LEFT THORACENTESIS

MEDICATIONS:
10 mL 1% lidocaine

COMPLICATIONS:
None immediate.

PROCEDURE:
An ultrasound guided thoracentesis was thoroughly discussed with the
patient and questions answered. The benefits, risks, alternatives
and complications were also discussed. The patient understands and
wishes to proceed with the procedure. Written consent was obtained.

Ultrasound was performed to localize and mark an adequate pocket of
fluid in the left chest. The area was then prepped and draped in the
normal sterile fashion. 1% Lidocaine was used for local anesthesia.
Under ultrasound guidance a 6 Fr Safe-T-Centesis catheter was
introduced. Thoracentesis was performed. The catheter was removed
and a dressing applied.
FINDINGS: A total of approximately 650 mL of amber fluid was removed.
IMPRESSION: Successful ultrasound guided left thoracentesis yielding 650 mL of
pleural fluid.

## 2020-09-17 MED ORDER — LIDOCAINE HCL 1 % IJ SOLN
INTRAMUSCULAR | Status: AC
  Filled 2020-09-17: qty 20

## 2020-09-17 MED ORDER — LIDOCAINE HCL 1 % IJ SOLN
INTRAMUSCULAR | Status: DC | PRN
  Administered 2020-09-17: 10 mL

## 2020-09-17 NOTE — Telephone Encounter (Signed)
Home health referral sent to [place] with [facility]. She advises that they are able to accept patient and will call to set up services.

## 2020-09-17 NOTE — Addendum Note (Signed)
Addended by: Verlin Grills on: 09/17/2020 10:30 AM   Modules accepted: Orders

## 2020-09-17 NOTE — Procedures (Signed)
PROCEDURE SUMMARY:  Successful US guided diagnostic and therapeutic left thoracentesis. Yielded 600 mL of amber fluid. Pt tolerated procedure well. No immediate complications.  Specimen was sent for labs. CXR ordered.  EBL < 5 mL  Docia Barrier PA-C 09/17/2020 1:41 PM

## 2020-09-17 NOTE — Telephone Encounter (Addendum)
Margaret from [facility] stated that they are in need of the office notes from 09/12/2020 and insurance to be faxed over to them; [phone] (fax number).  Pls regard; [phone]

## 2020-09-17 NOTE — Telephone Encounter (Signed)
ATC, left VM that I have faxed over OV notes and insurance card and left callback number for anymore questions/concerns. Nothing further needed at this time.

## 2020-09-17 NOTE — Telephone Encounter (Signed)
Please refer to home health physical therapy for Parkinson's disease/parkinsonism and decline in mobility.

## 2020-09-17 NOTE — Telephone Encounter (Signed)
I called the pt and with his wife. I advised we have placed referral for physical therapy. She verbalized understanding/appreciation for the call. She will CB if she has not heard in 1 week about the referral.

## 2020-09-18 ENCOUNTER — Encounter: Payer: Self-pay | Admitting: Internal Medicine

## 2020-09-18 DIAGNOSIS — E785 Hyperlipidemia, unspecified: Secondary | ICD-10-CM | POA: Diagnosis not present

## 2020-09-18 DIAGNOSIS — R296 Repeated falls: Secondary | ICD-10-CM | POA: Diagnosis not present

## 2020-09-18 DIAGNOSIS — Z9181 History of falling: Secondary | ICD-10-CM | POA: Diagnosis not present

## 2020-09-18 DIAGNOSIS — G20 Parkinson's disease: Secondary | ICD-10-CM | POA: Diagnosis not present

## 2020-09-18 DIAGNOSIS — J9611 Chronic respiratory failure with hypoxia: Secondary | ICD-10-CM | POA: Diagnosis not present

## 2020-09-18 DIAGNOSIS — F32A Depression, unspecified: Secondary | ICD-10-CM | POA: Diagnosis not present

## 2020-09-18 DIAGNOSIS — Z9981 Dependence on supplemental oxygen: Secondary | ICD-10-CM | POA: Diagnosis not present

## 2020-09-18 DIAGNOSIS — E119 Type 2 diabetes mellitus without complications: Secondary | ICD-10-CM | POA: Diagnosis not present

## 2020-09-18 DIAGNOSIS — Z87891 Personal history of nicotine dependence: Secondary | ICD-10-CM | POA: Diagnosis not present

## 2020-09-18 DIAGNOSIS — M1990 Unspecified osteoarthritis, unspecified site: Secondary | ICD-10-CM | POA: Diagnosis not present

## 2020-09-18 DIAGNOSIS — I10 Essential (primary) hypertension: Secondary | ICD-10-CM | POA: Diagnosis not present

## 2020-09-18 DIAGNOSIS — H5462 Unqualified visual loss, left eye, normal vision right eye: Secondary | ICD-10-CM | POA: Diagnosis not present

## 2020-09-18 DIAGNOSIS — D649 Anemia, unspecified: Secondary | ICD-10-CM | POA: Diagnosis not present

## 2020-09-18 DIAGNOSIS — Z7984 Long term (current) use of oral hypoglycemic drugs: Secondary | ICD-10-CM | POA: Diagnosis not present

## 2020-09-18 DIAGNOSIS — R918 Other nonspecific abnormal finding of lung field: Secondary | ICD-10-CM | POA: Diagnosis not present

## 2020-09-18 DIAGNOSIS — K5790 Diverticulosis of intestine, part unspecified, without perforation or abscess without bleeding: Secondary | ICD-10-CM | POA: Diagnosis not present

## 2020-09-18 DIAGNOSIS — H409 Unspecified glaucoma: Secondary | ICD-10-CM | POA: Diagnosis not present

## 2020-09-18 DIAGNOSIS — J432 Centrilobular emphysema: Secondary | ICD-10-CM | POA: Diagnosis not present

## 2020-09-18 LAB — CYTOLOGY - NON PAP

## 2020-09-18 NOTE — Progress Notes (Signed)
(  Key: [id number])  [employer] is reviewing your PA request and will respond within 24 hours for Medicaid or up to 72 hours for non-Medicaid plans, based on the required timeframe determined by state or federal regulations. To check for an update later, open this request from your dashboard.

## 2020-09-18 NOTE — Progress Notes (Signed)
(  Key: [id number])  [employer] is reviewing your PA request and will respond within 24 hours for Medicaid or up to 72 hours for non-Medicaid plans, based on the required timeframe determined by state or federal regulations. To check for an update later, open this request from your dashboard.

## 2020-09-19 ENCOUNTER — Telehealth: Payer: Self-pay | Admitting: Neurology

## 2020-09-19 NOTE — Telephone Encounter (Signed)
I returned Kelly's call and left a secure VM with verbal orders from Dr [name] for [facility] 1 week 1, 2 week 4, and 1 week 4. Left office number for call back if needed.

## 2020-09-19 NOTE — Telephone Encounter (Signed)
PT [facility] has called for verbal orders for Home Care PT 1 week 1, 2 week 4, and 1 week 4 Kelly's vm is secure if a vm needs to be left

## 2020-09-20 ENCOUNTER — Telehealth (HOSPITAL_COMMUNITY): Payer: Self-pay

## 2020-09-20 ENCOUNTER — Telehealth (HOSPITAL_COMMUNITY): Payer: Self-pay | Admitting: *Deleted

## 2020-09-20 NOTE — Telephone Encounter (Signed)
I spoke with patient's wife, [name] re: referral to pulmonary rehab.  I noticed home physical therapy has been ordered and her husband needs to complete that program to see if he is able to participate in pulmonary in the future.  Spouse agrees and referral  and orientation walk test will be cancelled.

## 2020-09-20 NOTE — Telephone Encounter (Signed)
Called and spoke with pt wife [name] who stated pt is interested in [place]. Patient will come in for orientation on 09/24/20 @ 11AM and will attend the 1:15AM exercise class.   Tourist information centre manager.

## 2020-09-24 ENCOUNTER — Encounter: Payer: Self-pay | Admitting: Pulmonary Disease

## 2020-09-24 ENCOUNTER — Ambulatory Visit (HOSPITAL_COMMUNITY): Payer: Medicare Other

## 2020-09-24 DIAGNOSIS — I10 Essential (primary) hypertension: Secondary | ICD-10-CM | POA: Diagnosis not present

## 2020-09-24 DIAGNOSIS — M1990 Unspecified osteoarthritis, unspecified site: Secondary | ICD-10-CM | POA: Diagnosis not present

## 2020-09-24 DIAGNOSIS — E119 Type 2 diabetes mellitus without complications: Secondary | ICD-10-CM | POA: Diagnosis not present

## 2020-09-24 DIAGNOSIS — J432 Centrilobular emphysema: Secondary | ICD-10-CM | POA: Diagnosis not present

## 2020-09-24 DIAGNOSIS — G20 Parkinson's disease: Secondary | ICD-10-CM | POA: Diagnosis not present

## 2020-09-24 DIAGNOSIS — J9611 Chronic respiratory failure with hypoxia: Secondary | ICD-10-CM | POA: Diagnosis not present

## 2020-09-26 DIAGNOSIS — J432 Centrilobular emphysema: Secondary | ICD-10-CM | POA: Diagnosis not present

## 2020-09-26 DIAGNOSIS — J9611 Chronic respiratory failure with hypoxia: Secondary | ICD-10-CM | POA: Diagnosis not present

## 2020-09-26 DIAGNOSIS — G20 Parkinson's disease: Secondary | ICD-10-CM | POA: Diagnosis not present

## 2020-09-26 DIAGNOSIS — E119 Type 2 diabetes mellitus without complications: Secondary | ICD-10-CM | POA: Diagnosis not present

## 2020-09-26 DIAGNOSIS — M1990 Unspecified osteoarthritis, unspecified site: Secondary | ICD-10-CM | POA: Diagnosis not present

## 2020-09-26 DIAGNOSIS — I10 Essential (primary) hypertension: Secondary | ICD-10-CM | POA: Diagnosis not present

## 2020-09-27 ENCOUNTER — Telehealth: Payer: Self-pay | Admitting: Pulmonary Disease

## 2020-09-27 NOTE — Telephone Encounter (Signed)
Lm for James [name].

## 2020-09-27 NOTE — Telephone Encounter (Signed)
LMTCB

## 2020-09-27 NOTE — Telephone Encounter (Signed)
Pt returning a phone call. Pt can be reached at [phone]

## 2020-09-27 NOTE — Telephone Encounter (Signed)
Lm for patient.  

## 2020-09-28 ENCOUNTER — Other Ambulatory Visit: Payer: Self-pay

## 2020-10-01 ENCOUNTER — Other Ambulatory Visit: Payer: Self-pay | Admitting: *Deleted

## 2020-10-01 MED ORDER — FORMOTEROL FUMARATE 20 MCG/2ML IN NEBU: 20.0000 ug | INHALATION_SOLUTION | Freq: Two times a day (BID) | RESPIRATORY_TRACT | 5 refills | Status: AC

## 2020-10-02 ENCOUNTER — Ambulatory Visit (HOSPITAL_COMMUNITY): Payer: Medicare Other

## 2020-10-02 ENCOUNTER — Ambulatory Visit (HOSPITAL_BASED_OUTPATIENT_CLINIC_OR_DEPARTMENT_OTHER): Payer: Medicare Other | Admitting: Cardiology

## 2020-10-02 NOTE — Telephone Encounter (Signed)
See result note. Pt has been given results. Will close encounter.

## 2020-10-03 ENCOUNTER — Telehealth: Payer: Self-pay | Admitting: Neurology

## 2020-10-03 DIAGNOSIS — G20 Parkinson's disease: Secondary | ICD-10-CM | POA: Diagnosis not present

## 2020-10-03 DIAGNOSIS — I10 Essential (primary) hypertension: Secondary | ICD-10-CM | POA: Diagnosis not present

## 2020-10-03 DIAGNOSIS — M1990 Unspecified osteoarthritis, unspecified site: Secondary | ICD-10-CM | POA: Diagnosis not present

## 2020-10-03 DIAGNOSIS — J9611 Chronic respiratory failure with hypoxia: Secondary | ICD-10-CM | POA: Diagnosis not present

## 2020-10-03 DIAGNOSIS — E119 Type 2 diabetes mellitus without complications: Secondary | ICD-10-CM | POA: Diagnosis not present

## 2020-10-03 DIAGNOSIS — J432 Centrilobular emphysema: Secondary | ICD-10-CM | POA: Diagnosis not present

## 2020-10-03 NOTE — Telephone Encounter (Signed)
Renee PT assistant called pt had a missed visit this week, pt fell down wasn't up to coming in. Pt fell on Sunday. Weren't able to do the PT. Can leave message on Renee's cell if needed.

## 2020-10-03 NOTE — Telephone Encounter (Signed)
I called spoke to renee, PT she relayed pt missed Monday PT due to fall on Sunday going down stairs.  She saw him today.  Cut on ear, R hip bruised. Pain 3/10 did walk downstairs (with her), recommend use O2 all the time.  Wife saw Dr [name] and this was mentioned to him.  She was notifying us as we are MD on referral.  Noted.

## 2020-10-04 ENCOUNTER — Ambulatory Visit (HOSPITAL_COMMUNITY): Payer: Medicare Other

## 2020-10-08 DIAGNOSIS — J432 Centrilobular emphysema: Secondary | ICD-10-CM | POA: Diagnosis not present

## 2020-10-08 DIAGNOSIS — I10 Essential (primary) hypertension: Secondary | ICD-10-CM | POA: Diagnosis not present

## 2020-10-08 DIAGNOSIS — E119 Type 2 diabetes mellitus without complications: Secondary | ICD-10-CM | POA: Diagnosis not present

## 2020-10-08 DIAGNOSIS — J9611 Chronic respiratory failure with hypoxia: Secondary | ICD-10-CM | POA: Diagnosis not present

## 2020-10-08 DIAGNOSIS — G20 Parkinson's disease: Secondary | ICD-10-CM | POA: Diagnosis not present

## 2020-10-08 DIAGNOSIS — M1990 Unspecified osteoarthritis, unspecified site: Secondary | ICD-10-CM | POA: Diagnosis not present

## 2020-10-09 ENCOUNTER — Ambulatory Visit (HOSPITAL_COMMUNITY): Payer: Medicare Other

## 2020-10-10 DIAGNOSIS — I10 Essential (primary) hypertension: Secondary | ICD-10-CM | POA: Diagnosis not present

## 2020-10-10 DIAGNOSIS — M1990 Unspecified osteoarthritis, unspecified site: Secondary | ICD-10-CM | POA: Diagnosis not present

## 2020-10-10 DIAGNOSIS — G20 Parkinson's disease: Secondary | ICD-10-CM | POA: Diagnosis not present

## 2020-10-10 DIAGNOSIS — J432 Centrilobular emphysema: Secondary | ICD-10-CM | POA: Diagnosis not present

## 2020-10-10 DIAGNOSIS — E119 Type 2 diabetes mellitus without complications: Secondary | ICD-10-CM | POA: Diagnosis not present

## 2020-10-10 DIAGNOSIS — J9611 Chronic respiratory failure with hypoxia: Secondary | ICD-10-CM | POA: Diagnosis not present

## 2020-10-11 ENCOUNTER — Ambulatory Visit (HOSPITAL_COMMUNITY): Payer: Medicare Other

## 2020-10-16 ENCOUNTER — Ambulatory Visit (HOSPITAL_COMMUNITY): Payer: Medicare Other

## 2020-10-17 DIAGNOSIS — M1990 Unspecified osteoarthritis, unspecified site: Secondary | ICD-10-CM | POA: Diagnosis not present

## 2020-10-17 DIAGNOSIS — E119 Type 2 diabetes mellitus without complications: Secondary | ICD-10-CM | POA: Diagnosis not present

## 2020-10-17 DIAGNOSIS — G20 Parkinson's disease: Secondary | ICD-10-CM | POA: Diagnosis not present

## 2020-10-17 DIAGNOSIS — J432 Centrilobular emphysema: Secondary | ICD-10-CM | POA: Diagnosis not present

## 2020-10-17 DIAGNOSIS — J9611 Chronic respiratory failure with hypoxia: Secondary | ICD-10-CM | POA: Diagnosis not present

## 2020-10-17 DIAGNOSIS — I10 Essential (primary) hypertension: Secondary | ICD-10-CM | POA: Diagnosis not present

## 2020-10-18 ENCOUNTER — Ambulatory Visit (HOSPITAL_COMMUNITY): Payer: Medicare Other

## 2020-10-18 DIAGNOSIS — J432 Centrilobular emphysema: Secondary | ICD-10-CM | POA: Diagnosis not present

## 2020-10-18 DIAGNOSIS — H409 Unspecified glaucoma: Secondary | ICD-10-CM | POA: Diagnosis not present

## 2020-10-18 DIAGNOSIS — D649 Anemia, unspecified: Secondary | ICD-10-CM | POA: Diagnosis not present

## 2020-10-18 DIAGNOSIS — R296 Repeated falls: Secondary | ICD-10-CM | POA: Diagnosis not present

## 2020-10-18 DIAGNOSIS — H5462 Unqualified visual loss, left eye, normal vision right eye: Secondary | ICD-10-CM | POA: Diagnosis not present

## 2020-10-18 DIAGNOSIS — Z9181 History of falling: Secondary | ICD-10-CM | POA: Diagnosis not present

## 2020-10-18 DIAGNOSIS — Z87891 Personal history of nicotine dependence: Secondary | ICD-10-CM | POA: Diagnosis not present

## 2020-10-18 DIAGNOSIS — G20 Parkinson's disease: Secondary | ICD-10-CM | POA: Diagnosis not present

## 2020-10-18 DIAGNOSIS — K5790 Diverticulosis of intestine, part unspecified, without perforation or abscess without bleeding: Secondary | ICD-10-CM | POA: Diagnosis not present

## 2020-10-18 DIAGNOSIS — F32A Depression, unspecified: Secondary | ICD-10-CM | POA: Diagnosis not present

## 2020-10-18 DIAGNOSIS — M1990 Unspecified osteoarthritis, unspecified site: Secondary | ICD-10-CM | POA: Diagnosis not present

## 2020-10-18 DIAGNOSIS — E119 Type 2 diabetes mellitus without complications: Secondary | ICD-10-CM | POA: Diagnosis not present

## 2020-10-18 DIAGNOSIS — I10 Essential (primary) hypertension: Secondary | ICD-10-CM | POA: Diagnosis not present

## 2020-10-18 DIAGNOSIS — J9611 Chronic respiratory failure with hypoxia: Secondary | ICD-10-CM | POA: Diagnosis not present

## 2020-10-18 DIAGNOSIS — R918 Other nonspecific abnormal finding of lung field: Secondary | ICD-10-CM | POA: Diagnosis not present

## 2020-10-18 DIAGNOSIS — E785 Hyperlipidemia, unspecified: Secondary | ICD-10-CM | POA: Diagnosis not present

## 2020-10-18 DIAGNOSIS — Z7984 Long term (current) use of oral hypoglycemic drugs: Secondary | ICD-10-CM | POA: Diagnosis not present

## 2020-10-18 DIAGNOSIS — Z9981 Dependence on supplemental oxygen: Secondary | ICD-10-CM | POA: Diagnosis not present

## 2020-10-22 ENCOUNTER — Inpatient Hospital Stay: Payer: Medicare Other | Attending: Hematology and Oncology

## 2020-10-22 ENCOUNTER — Other Ambulatory Visit: Payer: Self-pay

## 2020-10-22 DIAGNOSIS — D509 Iron deficiency anemia, unspecified: Secondary | ICD-10-CM | POA: Insufficient documentation

## 2020-10-22 LAB — CBC WITH DIFFERENTIAL (CANCER CENTER ONLY)
Abs Immature Granulocytes: 0.07 10*3/uL (ref 0.00–0.07)
Basophils Absolute: 0 10*3/uL (ref 0.0–0.1)
Basophils Relative: 0 %
Eosinophils Absolute: 0.1 10*3/uL (ref 0.0–0.5)
Eosinophils Relative: 1 %
HCT: 30 % — ABNORMAL LOW (ref 39.0–52.0)
Hemoglobin: 8.7 g/dL — ABNORMAL LOW (ref 13.0–17.0)
Immature Granulocytes: 1 %
Lymphocytes Relative: 12 %
Lymphs Abs: 1.6 10*3/uL (ref 0.7–4.0)
MCH: 20.5 pg — ABNORMAL LOW (ref 26.0–34.0)
MCHC: 29 g/dL — ABNORMAL LOW (ref 30.0–36.0)
MCV: 70.6 fL — ABNORMAL LOW (ref 80.0–100.0)
Monocytes Absolute: 1.1 10*3/uL — ABNORMAL HIGH (ref 0.1–1.0)
Monocytes Relative: 8 %
Neutro Abs: 10.2 10*3/uL — ABNORMAL HIGH (ref 1.7–7.7)
Neutrophils Relative %: 78 %
Platelet Count: 441 10*3/uL — ABNORMAL HIGH (ref 150–400)
RBC: 4.25 MIL/uL (ref 4.22–5.81)
RDW: 16.9 % — ABNORMAL HIGH (ref 11.5–15.5)
WBC Count: 13 10*3/uL — ABNORMAL HIGH (ref 4.0–10.5)
nRBC: 0 % (ref 0.0–0.2)

## 2020-10-22 LAB — IRON AND TIBC
Iron: 18 ug/dL — ABNORMAL LOW (ref 42–163)
Saturation Ratios: 6 % — ABNORMAL LOW (ref 20–55)
TIBC: 326 ug/dL (ref 202–409)
UIBC: 308 ug/dL (ref 117–376)

## 2020-10-22 LAB — FERRITIN: Ferritin: 46 ng/mL (ref 24–336)

## 2020-10-22 NOTE — Assessment & Plan Note (Signed)
08/19/2018: Hemoglobin 11.7, MCV 74.3, RDW 18.2, rest of the CBC and platelet count normal 08/13/2017: Hemoglobin 14.8, MCV 92.4, WBC 9, platelets 245 12/20/2018: Hemoglobin 12.2, MCV 75, RDW 18.5, platelets 239, ferritin 6, iron saturation 6%, TIBC 421 06/20/2019: Hemoglobin 15.4, MCV 93.5, RDW 15.8, platelets 215, ferritin 20, iron saturation 21%, TIBC 386 10/14/2019:Hemoglobin 13.1, MCV 86.2, ferritin 6, iron saturation 7% 04/18/20: Hb 11.1, MCV 77, Iron Sat: 5%, TIBC 411, Ferritin < 4 10/22/20: Hb 8.7, Iron Sat: 6%, Ferritin 46  IV iron: November 2020, Jan 2021(Venofer x4), Aug 2021  Recommend 3 doses of Venofer. F/U in 6 months with labs done ahead of time.

## 2020-10-22 NOTE — Progress Notes (Signed)
Patient Care Team: [name], MD as PCP - General (Internal Medicine)  DIAGNOSIS:    ICD-10-CM   1. Iron deficiency anemia due to chronic blood loss  D50.0       CHIEF COMPLIANT: Follow-up of iron deficiency anemia  INTERVAL HISTORY: James [name] is a 75 y.o. with above-mentioned history of iron deficiency anemia currently on oral iron and receiving IV iron (last 05/05/19). Labs on 04/17/20 showed Hg 8.7, HCT 30.0, platelets 441, ferritin 46, iron saturation 6%. He presents to the clinic today to review his labs.  His shortness of breath has gotten markedly worse.  He tells me that he has developed pleural effusion for which he underwent thoracentesis and it fell back up and so he is anxiously waiting for another round of thoracentesis to be done.  He follows with pulmonary for that.  ALLERGIES:  is allergic to aspirin, codeine, and morphine.  MEDICATIONS:  Current Outpatient Medications  Medication Sig Dispense Refill   albuterol (VENTOLIN HFA) 108 (90 Base) MCG/ACT inhaler Inhale 1-2 puffs into the lungs every 6 (six) hours as needed for wheezing or shortness of breath. 8 g 6   amoxicillin-clavulanate (AUGMENTIN) 875-125 MG tablet Take 1 tablet by mouth 2 (two) times daily. 20 tablet 0   atorvastatin (LIPITOR) 20 MG tablet Take 20 mg by mouth at bedtime.      budesonide (PULMICORT) 0.5 MG/2ML nebulizer solution Take 2 mLs (0.5 mg total) by nebulization 2 (two) times daily. 60 mL 5   buPROPion (WELLBUTRIN XL) 300 MG 24 hr tablet Take 300 mg by mouth daily.     calcium-vitamin D (OSCAL WITH D) 250-125 MG-UNIT per tablet Take 1 tablet by mouth daily.     carbidopa-levodopa (SINEMET IR) 25-100 MG tablet Take 1.5 tablets by mouth 3 (three) times daily. 405 tablet 3   Carbidopa-Levodopa ER (SINEMET CR) 25-100 MG tablet controlled release Take 1 tablet by mouth 2 (two) times daily. 180 tablet 3   cetirizine (ZYRTEC) 10 MG tablet Take 10 mg by mouth daily.     donepezil (ARICEPT) 10  MG tablet Take 1 tablet (10 mg total) by mouth at bedtime. 90 tablet 3   ferrous gluconate (FERGON) 324 MG tablet TAKE 1 TABLET BY MOUTH DAILY WITH BREAKFAST 90 tablet 3   ferrous sulfate 325 (65 FE) MG tablet Take 325 mg by mouth 2 (two) times daily with a meal.      formoterol (PERFOROMIST) 20 MCG/2ML nebulizer solution Take 2 mLs (20 mcg total) by nebulization 2 (two) times daily. 120 mL 5   Green Tea 315 MG CAPS Take 315 mg by mouth daily.     hydrochlorothiazide (HYDRODIURIL) 25 MG tablet Take 25 mg by mouth daily.     levalbuterol (XOPENEX) 0.31 MG/3ML nebulizer solution Take 3 mLs (0.31 mg total) by nebulization every 4 (four) hours as needed for wheezing. 540 mL 12   MEGARED OMEGA-3 KRILL OIL PO Take 1 capsule by mouth daily.     metFORMIN (GLUCOPHAGE) 500 MG tablet Take 1,000 mg by mouth 2 (two) times daily with a meal.      mirtazapine (REMERON) 15 MG tablet Take 15 mg by mouth at bedtime.     Multiple Vitamins-Minerals (MULTIVITAMIN WITH MINERALS) tablet Take 1 tablet by mouth daily.     omeprazole (PRILOSEC OTC) 20 MG tablet Take 20 mg by mouth daily with breakfast.     predniSONE (DELTASONE) 10 MG tablet Take 4 tablets (40 mg total) by mouth daily  with breakfast for 2 days, THEN 3 tablets (30 mg total) daily with breakfast for 2 days, THEN 2 tablets (20 mg total) daily with breakfast for 2 days, THEN 1 tablet (10 mg total) daily with breakfast for 2 days. 20 tablet 0   revefenacin (YUPELRI) 175 MCG/3ML nebulizer solution Take 3 mLs (175 mcg total) by nebulization daily. 90 mL 5   sertraline (ZOLOFT) 100 MG tablet Take 150 mg by mouth daily.     vardenafil (LEVITRA) 20 MG tablet Take 20 mg by mouth daily as needed for erectile dysfunction.      No current facility-administered medications for this visit.    PHYSICAL EXAMINATION: ECOG PERFORMANCE STATUS: 1 - Symptomatic but completely ambulatory  Vitals:   10/23/20 1410  Pulse: (!) 102  Resp: 20  Temp: 98.2 F (36.8 C)  SpO2:  (!) 88%   Filed Weights   10/23/20 1410  Weight: 219 lb (99.3 kg)    LABORATORY DATA:  I have reviewed the data as listed CMP Latest Ref Rng & Units 03/18/2013 02/08/2008 11/24/2006  Glucose 70 - 99 mg/dL 130(H) 92 106(H)  BUN 6 - 23 mg/dL 14 8 10   Creatinine 0.4 - 1.5 mg/dL 1.1 0.92 0.92  Sodium 135 - 145 mEq/L 141 143 141  Potassium 3.5 - 5.1 mEq/L 3.9 4.1 4.8  Chloride 96 - 112 mEq/L 102 107 106  CO2 19 - 32 mEq/L 33(H) 31 29  Calcium 8.4 - 10.5 mg/dL 9.4 9.0 9.3    Lab Results  Component Value Date   WBC 13.0 (H) 10/22/2020   HGB 8.7 (L) 10/22/2020   HCT 30.0 (L) 10/22/2020   MCV 70.6 (L) 10/22/2020   PLT 441 (H) 10/22/2020   NEUTROABS 10.2 (H) 10/22/2020    ASSESSMENT & PLAN:  Iron deficiency anemia 08/19/2018: Hemoglobin 11.7, MCV 74.3, RDW 18.2, rest of the CBC and platelet count normal 08/13/2017: Hemoglobin 14.8, MCV 92.4, WBC 9, platelets 245 12/20/2018: Hemoglobin 12.2, MCV 75, RDW 18.5, platelets 239, ferritin 6, iron saturation 6%, TIBC 421 06/20/2019: Hemoglobin 15.4, MCV 93.5, RDW 15.8, platelets 215, ferritin 20, iron saturation 21%, TIBC 386 10/14/2019: Hemoglobin 13.1, MCV 86.2, ferritin 6, iron saturation 7% 04/18/20: Hb 11.1, MCV 77, Iron Sat: 5%, TIBC 411, Ferritin < 4 10/22/20: Hb 8.7, Iron Sat: 6%, Ferritin 46   IV iron: November 2020, Jan 2021 (Venofer x4), Aug 2021, March 2020 Recurrent pleural effusions: Status post thoracentesis, no malignant cells identified. Oxygen requiring COPD. Recommend 3 doses of Venofer. F/U in 4 months with labs done ahead of time.    No orders of the defined types were placed in this encounter.  The patient has a good understanding of the overall plan. he agrees with it. he will call with any problems that may develop before the next visit here.  Total time spent: 20 mins including face to face time and time spent for planning, charting and coordination of care  Rulon Eisenmenger, MD, MPH 10/23/2020  I, Thana Ates,  am acting as scribe for Dr. Nicholas Lose.  I have reviewed the above documentation for accuracy and completeness, and I agree with the above.

## 2020-10-23 ENCOUNTER — Ambulatory Visit (INDEPENDENT_AMBULATORY_CARE_PROVIDER_SITE_OTHER): Payer: Medicare Other

## 2020-10-23 ENCOUNTER — Ambulatory Visit (INDEPENDENT_AMBULATORY_CARE_PROVIDER_SITE_OTHER): Payer: Medicare Other | Admitting: Pulmonary Disease

## 2020-10-23 ENCOUNTER — Encounter: Payer: Self-pay | Admitting: Pulmonary Disease

## 2020-10-23 ENCOUNTER — Inpatient Hospital Stay: Payer: Medicare Other

## 2020-10-23 ENCOUNTER — Inpatient Hospital Stay (HOSPITAL_BASED_OUTPATIENT_CLINIC_OR_DEPARTMENT_OTHER): Payer: Medicare Other | Admitting: Hematology and Oncology

## 2020-10-23 ENCOUNTER — Ambulatory Visit (HOSPITAL_COMMUNITY): Payer: Medicare Other

## 2020-10-23 ENCOUNTER — Other Ambulatory Visit: Payer: Self-pay

## 2020-10-23 VITALS — BP 125/71 | HR 96 | Temp 98.8°F | Resp 18

## 2020-10-23 VITALS — BP 112/58 | HR 104 | Temp 98.1°F | Ht 73.0 in | Wt 219.0 lb

## 2020-10-23 DIAGNOSIS — J90 Pleural effusion, not elsewhere classified: Secondary | ICD-10-CM | POA: Diagnosis not present

## 2020-10-23 DIAGNOSIS — J439 Emphysema, unspecified: Secondary | ICD-10-CM | POA: Diagnosis not present

## 2020-10-23 DIAGNOSIS — D509 Iron deficiency anemia, unspecified: Secondary | ICD-10-CM

## 2020-10-23 DIAGNOSIS — J9611 Chronic respiratory failure with hypoxia: Secondary | ICD-10-CM | POA: Diagnosis not present

## 2020-10-23 DIAGNOSIS — D500 Iron deficiency anemia secondary to blood loss (chronic): Secondary | ICD-10-CM | POA: Diagnosis not present

## 2020-10-23 DIAGNOSIS — J432 Centrilobular emphysema: Secondary | ICD-10-CM

## 2020-10-23 DIAGNOSIS — J9811 Atelectasis: Secondary | ICD-10-CM | POA: Diagnosis not present

## 2020-10-23 IMAGING — DX DG CHEST 2V
3 series · 3 of 3 positions shown · non-contrast
Comparison: [DATE]

CLINICAL DATA: Pleural effusion

EXAM:
CHEST - 2 VIEW

[chest pa]
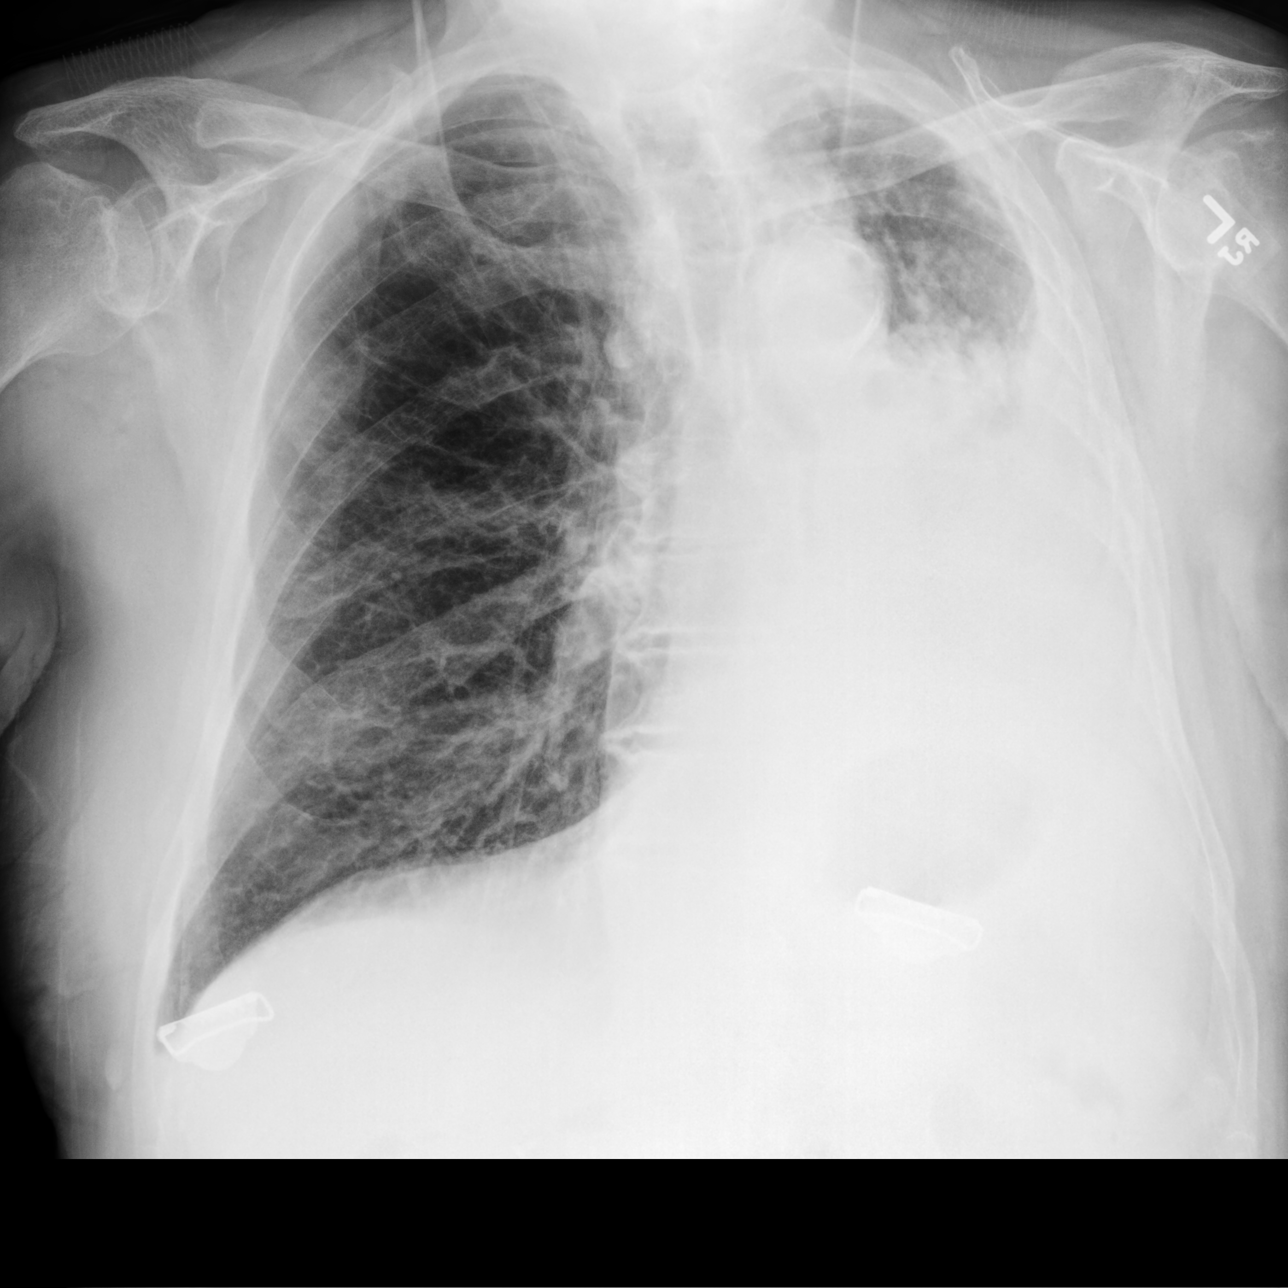

[chest lat (1 of 2)]
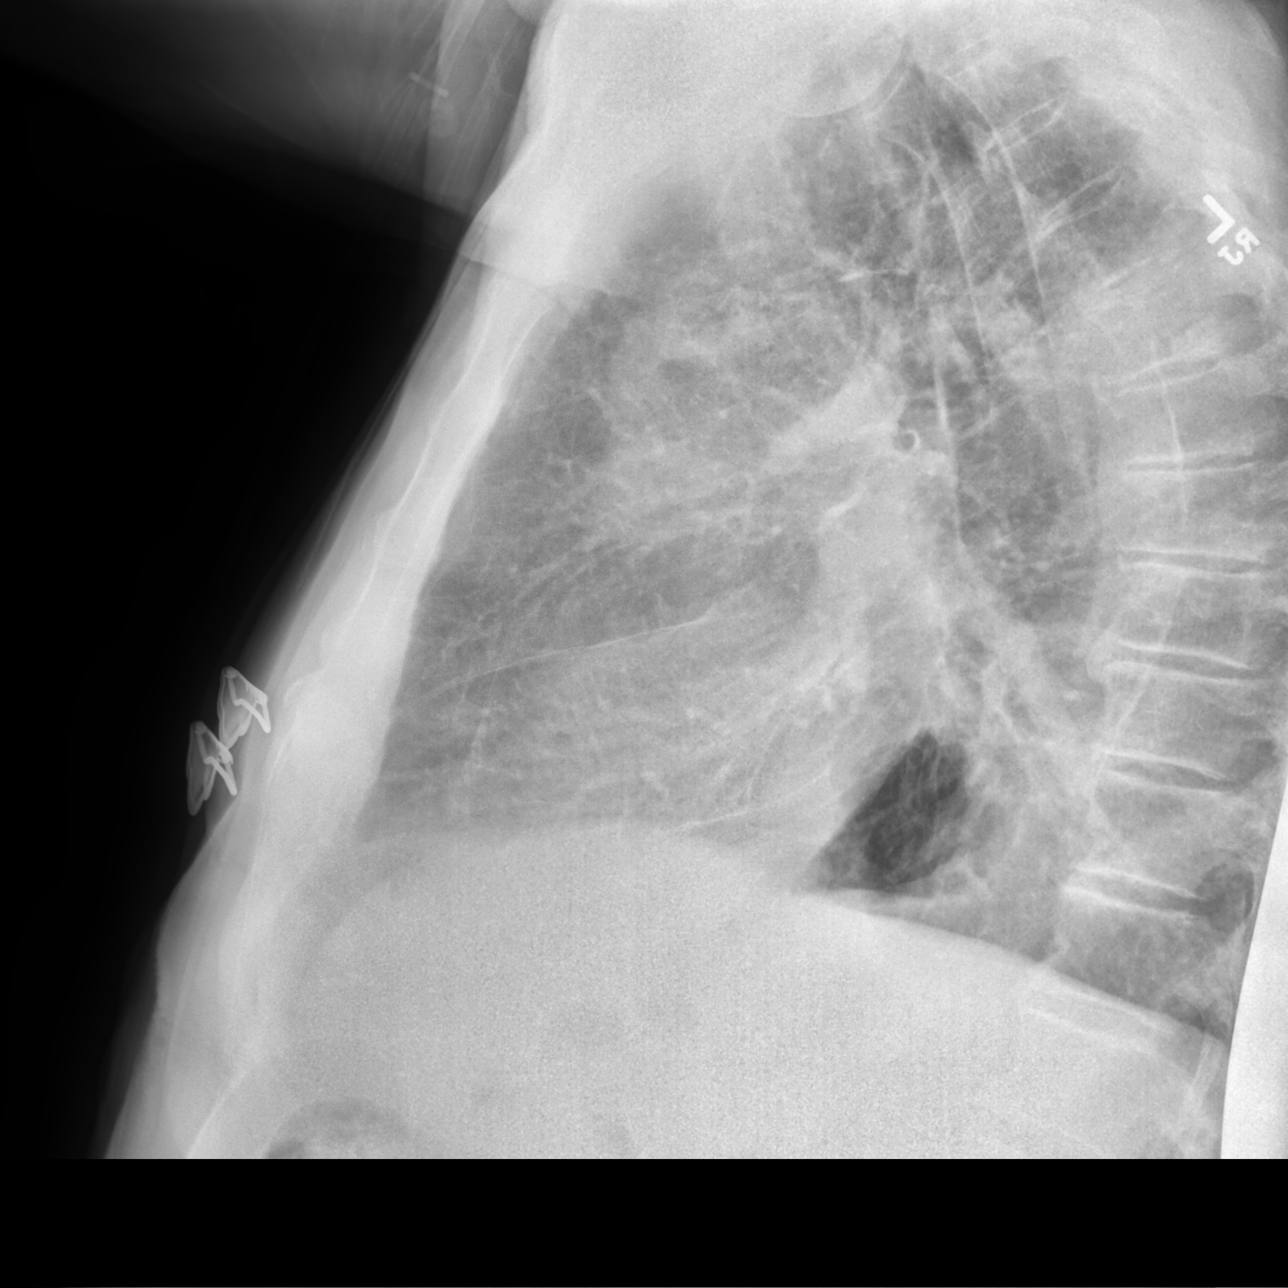

[chest lat (2 of 2)]
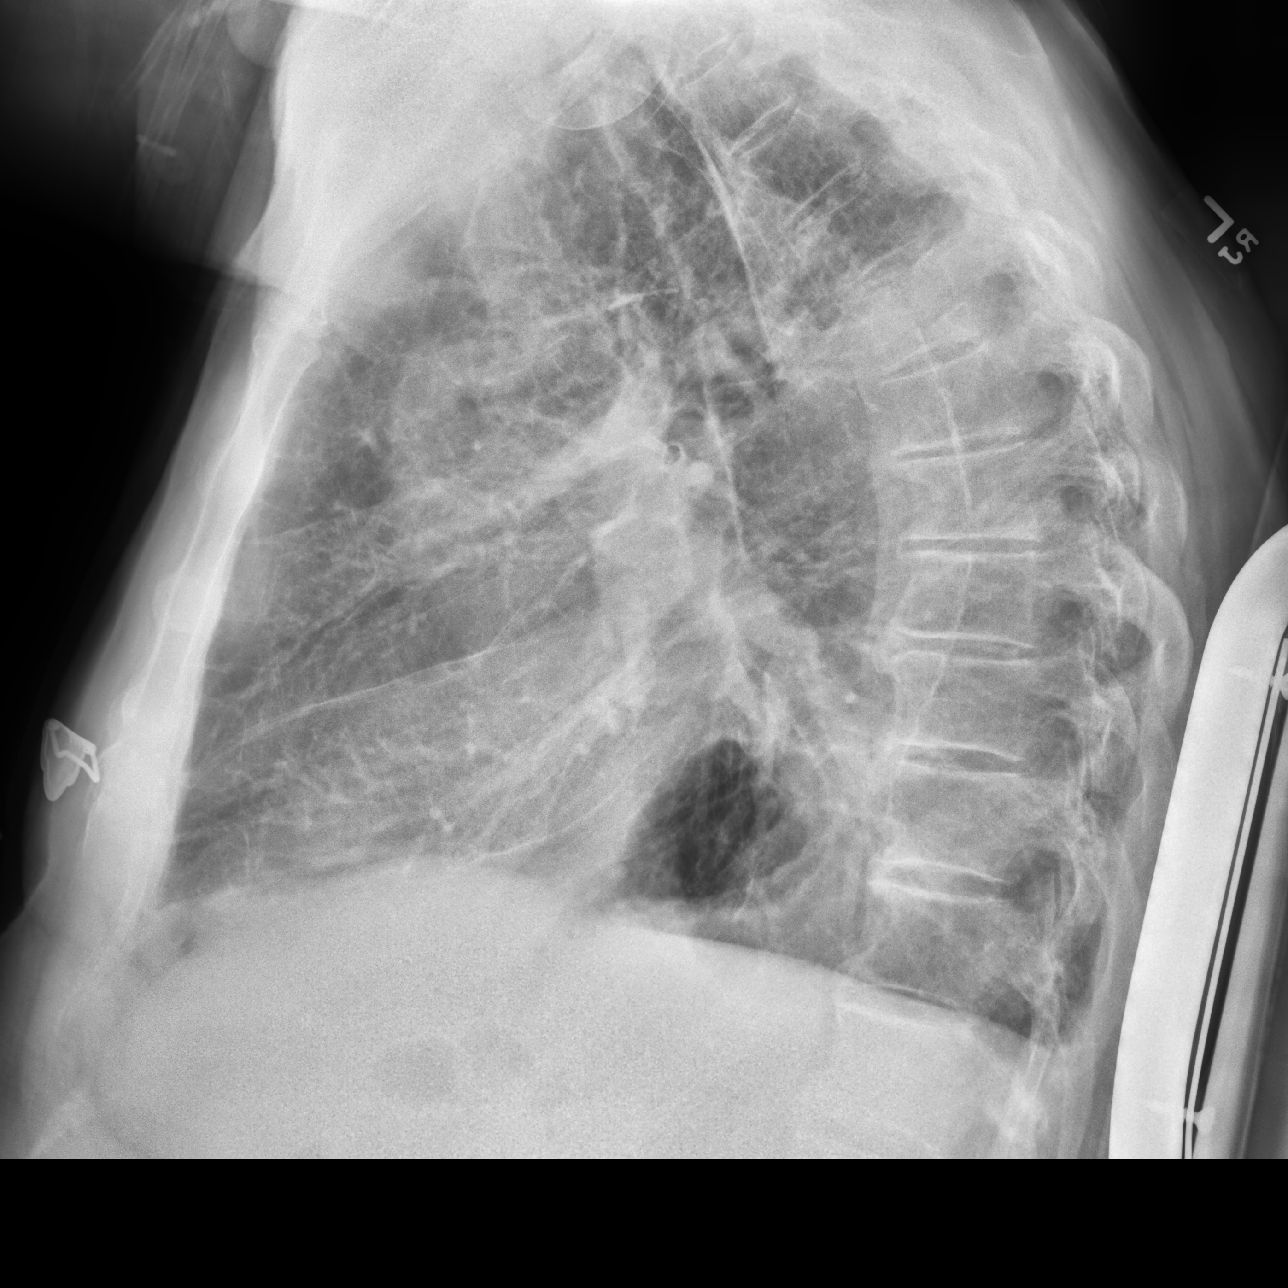

[3 of 3 positions shown; findings below may reference images not displayed]

FINDINGS: New moderate to large left pleural effusion. Underlying left lung
atelectasis with potential consolidation as well. Background changes
of emphysema. Cardiomediastinal contours are partially obscured. No
pneumothorax.
IMPRESSION: Moderate to large left pleural effusion with underlying atelectasis
and potential consolidation.

## 2020-10-23 MED ORDER — PREDNISONE 10 MG PO TABS: ORAL_TABLET | ORAL | 0 refills | Status: AC

## 2020-10-23 MED ORDER — SODIUM CHLORIDE 0.9 % IV SOLN: Freq: Once | INTRAVENOUS | Status: AC

## 2020-10-23 MED ORDER — SODIUM CHLORIDE 0.9 % IV SOLN
300.0000 mg | Freq: Once | INTRAVENOUS | Status: AC
  Administered 2020-10-23: 300 mg via INTRAVENOUS
  Filled 2020-10-23: qty 300

## 2020-10-23 MED ORDER — AMOXICILLIN-POT CLAVULANATE 875-125 MG PO TABS: 1.0000 | ORAL_TABLET | Freq: Two times a day (BID) | ORAL | 0 refills | Status: DC

## 2020-10-23 NOTE — Progress Notes (Signed)
Patient declined to stay for 30 minute post observation. Vitals stable and patient in no distress upon leaving infusion room.

## 2020-10-23 NOTE — Progress Notes (Signed)
[facility]  Chief Complaint  Patient presents with   Follow-up    Patient states that he is tired, weak and short of breath which is the same since last visit. Chest hurting all the time. Wears 3 1/2 liters all the time.      Constitutional:  BP (!) 112/58 (BP Location: Right Arm, Patient Position: Sitting, Cuff Size: Normal)   Pulse (!) 104   Temp 98.1 F (36.7 C) (Oral)   Ht 6\' 1"  (1.854 m)   Wt 219 lb (99.3 kg)   SpO2 90%   BMI 28.89 kg/m   Past Medical History:  Blind Lt eye, Depression, DM type 2, Diverticulosis, ED, Glaucoma, HLD, HTN, Anemia, OA, Parkinson disease, Rosacea, Colon polyps  Past Surgical History:  He  has a past surgical history that includes Partial colectomy (2008); Clavicle surgery ([place]); Knee arthroscopy (1996); Cholecystectomy; Tonsillectomy; Thyroid cyst excision; Arthroscopic repair ACL; Esophagogastroduodenoscopy (egd) with propofol (N/A, 07/25/2015); Colonoscopy with propofol (N/A, 07/25/2015); and IR THORACENTESIS ASP PLEURAL SPACE W/IMG GUIDE (09/17/2020).  Brief Summary:  James [name] is a 75 y.o. 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      Subjective:   He is here with his wife.  He had thoracentesis in July.  This was an exudate.  Cytology and culture were negative.  He felt better initially.  A few days after he started feeling more discomfort in his left chest.  This was associated with increased cough with sputum, and more shortness of breath.  He hasn't noticed any fever.  He has been using 3 to 3.5 liters oxygen.  He feels that his weakness is getting worse and worried his Parkinson's disease is progressing.  Chest xray today shows re-accumulation of left pleural effusion.  Physical Exam:   Appearance - well kempt, sitting in wheelchair, wearing oxygen  ENMT - no sinus tenderness, no oral exudate, no LAN, Mallampati 3 airway,  no stridor  Respiratory - decreased breath sounds in left lung  CV - s1s2 regular rate and rhythm, no murmurs  Ext - no clubbing, no edema  Skin - no rashes  Psych - normal mood and affect   Pulmonary testing:  Spirometry 06/08/12 >> FEV1 1.91 (49%), FEV1% 59 Ambulatory oximetry on room air 07/07/12 >> SpO2 86%  A1AT 07/07/12 >> 154, PI-MM PFT 05/02/20 >> FEV1 2.04 (58%), FEV1% 55, TLC 7.84 (102%), DLCO 41% Lt thoracentesis 09/17/20 >> glucose 117, protein 4.3, LDH 147, WBC 1490 (73% lymphs), cytology negative, culture negative  Chest Imaging:  CT chest 03/23/13 >> mild/diffuse centrilobular emphysema, 4 mm nodule RLL superior segment, 4 mm nodule LLL CT chest 02/20/14 >> moderate centrilobular/paraseptal emphysema, mild diffuse bronchial wall thickening, 3 mm nodule RLL and 5 mm nodule LLL no change CT chest 02/13/15 >> new 1.5 cm nodule RLL. CT chest 05/28/15 >> increased RLL irregular nodule to 2 cm PET scan 06/14/15 >> 1.93 SUV RLL 11 mm lesion CT chest 11/13/15 >> no progression CT chest 05/28/16 >> no change in lung nodule CT chest 06/01/17 >> no change CT chest 05/10/20 >> mod/severe centrilobular emphysema, bronchial thickening, no change in 1.2 x 0.7 cm RLL nodule, no change 4 mm LLL nodule and 3 mm RLL nodule  Sleep Tests:  PSG 01/25/08 >> RDI 6.8, SpO2 low 84%.  Spent 79% of test time with SpO2 < 90% ONO with RA 06/16/12 >> Test time 9 hrs 21 min.  Mean SpO2 89%, low SpO2 63%.  Spent 2 hrs 25 min with SpO2 < 88% PSG 07/13/19 >> AHI 3.9, SpO2 low 83%  Cardiac Tests:  Echo 06/06/20 >> EF 60 to 65%, grade 1 DD, aortic root 45 mm  Social History:  He  reports that he quit smoking about 5 years ago. His smoking use included cigarettes. He has a 75.00 pack-year smoking history. He has never used smokeless tobacco. He reports current alcohol use. He reports that he does not use drugs.  Family History:  His family history includes Alcohol abuse in his father; Asthma in his mother; CAD  in his father; COPD in his mother; Cancer in his maternal grandmother; Emphysema in his mother; Heart disease in his father.     Assessment/Plan:   Recurrent left pleural effusion. - previous fluid analysis showed exudate - will arrange for IR to repeat thoracentesis - will send pleural fluid for glucose, protein, LDH, cell count, gram stain and culture, AFB smear and culture, fungal culture and cytology - will plan to arrange for CT chest without contrast after he has pleural fluid drained - will give him course of prednisone and augmentin  COPD with emphysema. - continue pulmicort, perforomist, and yupelri - prn xopenex   Chronic respiratory failure from COPD. - goal SpO2 > 90% - he uses 3 to 3.5 liters 24/7   Right lower lung mass. - stable on serial CT imaging - no additional radiographic follow up needed   Parkinson's disease. - followed by Dr. Star Age with [facility]  Time Spent Involved in Patient Care on Day of Examination:  36 minutes  Follow up:   Patient Instructions  Will arrange for appointment with radiologist to drain fluid from around your left lung again  Augmentin 1 pill twice per day for 10 days  Prednisone 10 mg pill >> 4 pills daily for 2 days, 3 pills daily for 2 days, 2 pills daily for 2 days, 1 pill daily for 2 days  Follow up in 2 weeks with Dr. Halford Chessman or Nurse Practitioner  Medication List:   Allergies as of 10/23/2020       Reactions   Aspirin Other (See Comments)   Codeine Itching   Morphine Itching        Medication List        Accurate as of October 23, 2020 11:32 AM. If you have any questions, ask your nurse or doctor.          albuterol 108 (90 Base) MCG/ACT inhaler Commonly known as: VENTOLIN HFA Inhale 1-2 puffs into the lungs every 6 (six) hours as needed for wheezing or shortness of breath.   amoxicillin-clavulanate 875-125 MG tablet Commonly known as: AUGMENTIN Take 1 tablet by mouth 2 (two) times  daily.   atorvastatin 20 MG tablet Commonly known as: LIPITOR Take 20 mg by mouth at bedtime.   budesonide 0.5 MG/2ML nebulizer solution Commonly known as: PULMICORT Take 2 mLs (0.5 mg total) by nebulization 2 (two) times daily.   buPROPion 300 MG 24 hr tablet Commonly known as: WELLBUTRIN XL Take 300 mg by mouth daily.   calcium-vitamin D 250-125 MG-UNIT tablet Commonly known as: OSCAL WITH D Take 1 tablet by mouth daily.   carbidopa-levodopa 25-100 MG tablet Commonly known as: SINEMET IR Take 1.5 tablets by mouth 3 (three) times daily.   Carbidopa-Levodopa ER 25-100 MG tablet controlled release Commonly known as: SINEMET CR Take 1 tablet by mouth 2 (two) times daily.  cetirizine 10 MG tablet Commonly known as: ZYRTEC Take 10 mg by mouth daily.   donepezil 10 MG tablet Commonly known as: ARICEPT Take 1 tablet (10 mg total) by mouth at bedtime.   ferrous gluconate 324 MG tablet Commonly known as: FERGON TAKE 1 TABLET BY MOUTH DAILY WITH BREAKFAST   ferrous sulfate 325 (65 FE) MG tablet Take 325 mg by mouth 2 (two) times daily with a meal.   formoterol 20 MCG/2ML nebulizer solution Commonly known as: PERFOROMIST Take 2 mLs (20 mcg total) by nebulization 2 (two) times daily.   Green Tea 315 MG Caps Take 315 mg by mouth daily.   hydrochlorothiazide 25 MG tablet Commonly known as: HYDRODIURIL Take 25 mg by mouth daily.   levalbuterol 0.31 MG/3ML nebulizer solution Commonly known as: Xopenex Take 3 mLs (0.31 mg total) by nebulization every 4 (four) hours as needed for wheezing.   MEGARED OMEGA-3 KRILL OIL PO Take 1 capsule by mouth daily.   metFORMIN 500 MG tablet Commonly known as: GLUCOPHAGE Take 1,000 mg by mouth 2 (two) times daily with a meal.   mirtazapine 15 MG tablet Commonly known as: REMERON Take 15 mg by mouth at bedtime.   multivitamin with minerals tablet Take 1 tablet by mouth daily.   omeprazole 20 MG tablet Commonly known as: PRILOSEC  OTC Take 20 mg by mouth daily with breakfast.   predniSONE 10 MG tablet Commonly known as: DELTASONE Take 4 tablets (40 mg total) by mouth daily with breakfast for 2 days, THEN 3 tablets (30 mg total) daily with breakfast for 2 days, THEN 2 tablets (20 mg total) daily with breakfast for 2 days, THEN 1 tablet (10 mg total) daily with breakfast for 2 days. Start taking on: October 23, 2020 Started by: Chesley Mires, MD   revefenacin 175 MCG/3ML nebulizer solution Commonly known as: YUPELRI Take 3 mLs (175 mcg total) by nebulization daily.   sertraline 100 MG tablet Commonly known as: ZOLOFT Take 150 mg by mouth daily.   vardenafil 20 MG tablet Commonly known as: LEVITRA Take 20 mg by mouth daily as needed for erectile dysfunction.        Signature:  Chesley Mires, MD [place] Pager - [phone] 10/23/2020, 11:32 AM

## 2020-10-23 NOTE — Patient Instructions (Signed)
Will arrange for appointment with radiologist to drain fluid from around your left lung again  Augmentin 1 pill twice per day for 10 days  Prednisone 10 mg pill >> 4 pills daily for 2 days, 3 pills daily for 2 days, 2 pills daily for 2 days, 1 pill daily for 2 days  Follow up in 2 weeks with Dr. Halford Chessman or Nurse Practitioner

## 2020-10-23 NOTE — Patient Instructions (Signed)

## 2020-10-24 ENCOUNTER — Telehealth: Payer: Self-pay | Admitting: Hematology and Oncology

## 2020-10-24 ENCOUNTER — Telehealth: Payer: Self-pay | Admitting: Neurology

## 2020-10-24 ENCOUNTER — Encounter: Payer: Self-pay | Admitting: Internal Medicine

## 2020-10-24 ENCOUNTER — Ambulatory Visit (INDEPENDENT_AMBULATORY_CARE_PROVIDER_SITE_OTHER): Payer: Medicare Other | Admitting: Internal Medicine

## 2020-10-24 VITALS — BP 120/60 | HR 101 | Ht 73.0 in | Wt 219.0 lb

## 2020-10-24 DIAGNOSIS — E119 Type 2 diabetes mellitus without complications: Secondary | ICD-10-CM | POA: Diagnosis not present

## 2020-10-24 DIAGNOSIS — J432 Centrilobular emphysema: Secondary | ICD-10-CM | POA: Diagnosis not present

## 2020-10-24 DIAGNOSIS — R195 Other fecal abnormalities: Secondary | ICD-10-CM | POA: Diagnosis not present

## 2020-10-24 DIAGNOSIS — D500 Iron deficiency anemia secondary to blood loss (chronic): Secondary | ICD-10-CM | POA: Diagnosis not present

## 2020-10-24 DIAGNOSIS — M1990 Unspecified osteoarthritis, unspecified site: Secondary | ICD-10-CM | POA: Diagnosis not present

## 2020-10-24 DIAGNOSIS — E785 Hyperlipidemia, unspecified: Secondary | ICD-10-CM

## 2020-10-24 DIAGNOSIS — R079 Chest pain, unspecified: Secondary | ICD-10-CM | POA: Diagnosis not present

## 2020-10-24 DIAGNOSIS — J439 Emphysema, unspecified: Secondary | ICD-10-CM

## 2020-10-24 DIAGNOSIS — I10 Essential (primary) hypertension: Secondary | ICD-10-CM | POA: Diagnosis not present

## 2020-10-24 DIAGNOSIS — J9611 Chronic respiratory failure with hypoxia: Secondary | ICD-10-CM | POA: Diagnosis not present

## 2020-10-24 DIAGNOSIS — G20 Parkinson's disease: Secondary | ICD-10-CM | POA: Diagnosis not present

## 2020-10-24 MED ORDER — ISOSORBIDE MONONITRATE ER 30 MG PO TB24: 15.0000 mg | ORAL_TABLET | Freq: Every day | ORAL | 2 refills | Status: DC

## 2020-10-24 MED ORDER — NITROGLYCERIN 0.4 MG SL SUBL
0.4000 mg | SUBLINGUAL_TABLET | SUBLINGUAL | 3 refills | Status: DC | PRN
Start: 2020-10-24 — End: 2021-04-03

## 2020-10-24 NOTE — Telephone Encounter (Signed)
PT assistant Renee @ [facility] has called to report a fall pt had on 08-29 walking on a walking stick to car on the way to an appointment.  Neighbors came to help up.  Pt denies any injuries.  Renee said her recommendation is pt uses walker only.  If there are questions Joseph Art can be reached at [phone] this is FYI no call back requested

## 2020-10-24 NOTE — Patient Instructions (Addendum)
Medication Instructions:  START: IMDUR 15mg  DAILY NITROGLYCERIN- 0.4mg  SUBLINGUAL TABLETS- MAY USE EVERY 5 MINS AS NEEDED FOR CHEST PAIN. DO NOT TAKE MORE THAN 3 TABLETS IN 15 MINUTES.  HOLD (DO NOT TAKE) LEVITRA (VARDENAFIL) WHILE YOU ARE TAKING ISOSORBIDE OR NITROGLYCERIN  NITROGLYCERIN  is a type of vasodilator. It relaxes blood vessels, increasing the blood and oxygen supply to your heart. This medicine is used to relieve chest pain caused by angina.  In an angina attack (CHEST PAIN), you should feel better within 5 minutes after your first dose. Do not swallow whole. Place tablet under your tongue. Sit down when taking this medicine. You can take a dose every 5 minutes up to a total of 3 doses. If you do not feel better or feel worse after 1 dose, call 9-1-1 at once. Do not take more than 3 doses in 15 minutes. Do not take your medicine more often than directed.  *If you need a refill on your cardiac medications before your next appointment, please call your pharmacy*  Follow-Up: At [facility], you and your health needs are our priority.  As part of our continuing mission to provide you with exceptional heart care, we have created designated Provider Care Teams.  These Care Teams include your primary Cardiologist (physician) and Advanced Practice Providers (APPs -  Physician Assistants and Nurse Practitioners) who all work together to provide you with the care you need, when you need it.   Your next appointment:   11/15/20 at 3:40pm   The format for your next appointment:   In Person  Provider:   Cherlynn Kaiser, MD

## 2020-10-24 NOTE — Telephone Encounter (Signed)
Scheduled per los. Called and left msg. Mailed printout  °

## 2020-10-24 NOTE — Telephone Encounter (Signed)
I called patient spoke to his wife.  I relayed we had received a phone call from the physical therapist that he had had a fall on 29 August using a walking stick and she said he fell on his hip/butt and that she was right next to him no injuries.  He is now using his walker which is definitely made things more stable for him .  Continues with physical therapy.  She relayed that he has fluid on his lungs and had a thoracentesis 4 weeks ago and is due to have another 1 tomorrow.  She said his Parkinson's seems to be worse and he is weak.   Is getting iron infusions.  Has appointment on 8 September.  I relayed to let us know, we have wheelchairs available for him if he needs it when in office. She appreciated call back .

## 2020-10-24 NOTE — Progress Notes (Signed)
Cardiology Office Note:    Date:  10/24/2020   ID:  James [name], DOB 01-19-1946, MRN [id number]  PCP:  [name], MD  Cardiologist:  None  Electrophysiologist:  None   Referring MD: Chesley Mires, MD   Chief Complaint/Reason for Referral: Chest pain, CAC, COPD  History of Present Illness:    James [name] is a 75 y.o. male with a history of Parkinson's disease, COPD with chronic O2 for chronic respiratory failure, DM2, HLD, HTN, Iron deficiency anemia with worsening recent anemia, and recurrent left pleural effusion who presents for evaluation of chest pain.   Chest pain esp when coughing. Chest pain with activity or exertion or worked up. Sitting down and breathing O2 makes pain better. Several months of chest pain. Chest pain with 4 steps - feels very weak and has to stop. Cor cals noted on CTA chest in high risk distribution, LMCA, prox LAD and prox RCA. Long smoking history leading to COPD, with other risk factors of HLD and HTN as well as DM2. Also having presyncope but no syncope. Discussed concern for LM disease.   Father had stent at age 30.   James [name], wife, Therapist, sports (field of OBGYN) accompanies him for the visit and provides collaborative history.   Past Medical History:  Diagnosis Date   Allergy    seasonal   Blind left eye    legally   Blood transfusion    2002   COPD (chronic obstructive pulmonary disease) (HCC)    Depression    Diabetes mellitus, type 2 (HCC)    Diverticulosis    ED (erectile dysfunction)    Glaucoma    Hyperlipidemia    Hypertension    Insomnia    Internal and external bleeding hemorrhoids    Iron deficiency anemia    Osteoarthritis    Rosacea    Tremor of both hands    Tubular adenoma of colon 03/28/2011    Past Surgical History:  Procedure Laterality Date   ARTHROSCOPIC REPAIR ACL     [place]   right   COLONOSCOPY WITH PROPOFOL N/A 07/25/2015   Procedure: COLONOSCOPY WITH PROPOFOL;   Surgeon: [name], MD;  Location: [facility];  Service: Endoscopy;  Laterality: N/A;   ESOPHAGOGASTRODUODENOSCOPY (EGD) WITH PROPOFOL N/A 07/25/2015   Procedure: ESOPHAGOGASTRODUODENOSCOPY (EGD) WITH PROPOFOL;  Surgeon: [name], MD;  Location: [facility];  Service: Endoscopy;  Laterality: N/A;   IR THORACENTESIS ASP PLEURAL SPACE W/IMG GUIDE  09/17/2020   KNEE ARTHROSCOPY  1996   PARTIAL COLECTOMY  2008   THYROID CYST EXCISION     TONSILLECTOMY      Current Medications: Current Meds  Medication Sig   albuterol (VENTOLIN HFA) 108 (90 Base) MCG/ACT inhaler Inhale 1-2 puffs into the lungs every 6 (six) hours as needed for wheezing or shortness of breath.   amoxicillin-clavulanate (AUGMENTIN) 875-125 MG tablet Take 1 tablet by mouth 2 (two) times daily.   atorvastatin (LIPITOR) 20 MG tablet Take 20 mg by mouth at bedtime.    budesonide (PULMICORT) 0.5 MG/2ML nebulizer solution Take 2 mLs (0.5 mg total) by nebulization 2 (two) times daily.   buPROPion (WELLBUTRIN XL) 300 MG 24 hr tablet Take 300 mg by mouth daily.   calcium-vitamin D (OSCAL WITH D) 250-125 MG-UNIT per tablet Take 1 tablet by mouth daily.   carbidopa-levodopa (SINEMET IR) 25-100 MG tablet Take 1.5 tablets by mouth 3 (three) times daily.  Carbidopa-Levodopa ER (SINEMET CR) 25-100 MG tablet controlled release Take 1 tablet by mouth 2 (two) times daily.   cetirizine (ZYRTEC) 10 MG tablet Take 10 mg by mouth daily.   donepezil (ARICEPT) 10 MG tablet Take 1 tablet (10 mg total) by mouth at bedtime.   ferrous gluconate (FERGON) 324 MG tablet TAKE 1 TABLET BY MOUTH DAILY WITH BREAKFAST   ferrous sulfate 325 (65 FE) MG tablet Take 325 mg by mouth 2 (two) times daily with a meal.    formoterol (PERFOROMIST) 20 MCG/2ML nebulizer solution Take 2 mLs (20 mcg total) by nebulization 2 (two) times daily.   Green Tea 315 MG CAPS Take 315 mg by mouth daily.   hydrochlorothiazide (HYDRODIURIL) 25 MG tablet Take 25 mg by mouth  daily.   isosorbide mononitrate (IMDUR) 30 MG 24 hr tablet Take 0.5 tablets (15 mg total) by mouth daily.   levalbuterol (XOPENEX) 0.31 MG/3ML nebulizer solution Take 3 mLs (0.31 mg total) by nebulization every 4 (four) hours as needed for wheezing.   MEGARED OMEGA-3 KRILL OIL PO Take 1 capsule by mouth daily.   metFORMIN (GLUCOPHAGE) 500 MG tablet Take 1,000 mg by mouth 2 (two) times daily with a meal.    mirtazapine (REMERON) 15 MG tablet Take 15 mg by mouth at bedtime.   Multiple Vitamins-Minerals (MULTIVITAMIN WITH MINERALS) tablet Take 1 tablet by mouth daily.   nitroGLYCERIN (NITROSTAT) 0.4 MG SL tablet Place 1 tablet (0.4 mg total) under the tongue every 5 (five) minutes as needed for chest pain.   omeprazole (PRILOSEC OTC) 20 MG tablet Take 20 mg by mouth daily with breakfast.   predniSONE (DELTASONE) 10 MG tablet Take 4 tablets (40 mg total) by mouth daily with breakfast for 2 days, THEN 3 tablets (30 mg total) daily with breakfast for 2 days, THEN 2 tablets (20 mg total) daily with breakfast for 2 days, THEN 1 tablet (10 mg total) daily with breakfast for 2 days.   revefenacin (YUPELRI) 175 MCG/3ML nebulizer solution Take 3 mLs (175 mcg total) by nebulization daily.   sertraline (ZOLOFT) 100 MG tablet Take 150 mg by mouth daily.   [DISCONTINUED] vardenafil (LEVITRA) 20 MG tablet Take 20 mg by mouth daily as needed for erectile dysfunction.      Allergies:   Aspirin, Codeine, and Morphine   Social History   Tobacco Use   Smoking status: Former    Packs/day: 1.50    Years: 50.00    Pack years: 75.00    Types: Cigarettes    Quit date: 04/30/2015    Years since quitting: 5.4   Smokeless tobacco: Never   Tobacco comments:    States he sometimes sneaks a cigarette  Vaping Use   Vaping Use: Never used  Substance Use Topics   Alcohol use: Yes    Alcohol/week: 0.0 standard drinks    Comment: once yearly   Drug use: No     Family History: The patient's family history includes  Alcohol abuse in his father; Asthma in his mother; CAD in his father; COPD in his mother; Cancer in his maternal grandmother; Emphysema in his mother; Heart disease in his father. There is no history of Colon cancer.  ROS:   Please see the history of present illness.    All other systems reviewed and are negative.  EKGs/Labs/Other Studies Reviewed:    The following studies were reviewed today:  EKG:  NSR, rate 100 bpm, LAD, nonspecific ST abnormality.  Imaging studies that I have independently  reviewed today: CT chest 05/10/2020, cor cals as noted above.  Recent Labs: 10/22/2020: Hemoglobin 8.7; Platelet Count 441  Recent Lipid Panel No results found for: CHOL, TRIG, HDL, CHOLHDL, VLDL, LDLCALC, LDLDIRECT  Physical Exam:    VS:  BP 120/60 (BP Location: Left Arm)   Pulse (!) 101   Ht 6\' 1"  (1.854 m)   Wt 219 lb (99.3 kg)   SpO2 (!) 85%   BMI 28.89 kg/m     Wt Readings from Last 5 Encounters:  10/24/20 219 lb (99.3 kg)  10/23/20 219 lb (99.3 kg)  10/23/20 219 lb (99.3 kg)  09/12/20 210 lb 6.4 oz (95.4 kg)  06/18/20 236 lb 6.4 oz (107.2 kg)    Constitutional: appears frail, weak and tired. Skin appears pale.  Eyes: sclera non-icteric, normal conjunctiva and lids ENMT: moist mucous membranes Cardiovascular: regular rhythm, normal rate, no murmurs. S1 and S2 normal. No jugular venous distention.  Respiratory: bilateral end expiratory wheeze, left base diminished. GI : normal bowel sounds, soft and nontender. No distention.   MSK: extremities cool. No edema.  NEURO: grossly nonfocal exam, moves all extremities. PSYCH: alert and oriented x 3, normal mood and affect.   ASSESSMENT:    1. Chest pain of uncertain etiology   2. Iron deficiency anemia due to chronic blood loss   3. Heme positive stool   4. Pulmonary emphysema, unspecified emphysema type ([place])   5. Primary hypertension   6. Hyperlipidemia, unspecified hyperlipidemia type    PLAN:    Chest pain of uncertain  etiology - Plan: EKG 12-Lead Iron deficiency anemia due to chronic blood loss Heme positive stool Pulmonary emphysema, unspecified emphysema type ([place]) Primary hypertension Hyperlipidemia, unspecified hyperlipidemia type - concerning sounding chest pain however some improvement after thoracentesis. Possible cardiac chest pain. 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 We 've discussed in detail that ischemic testing is warranted but our options are limited by concomitant disease. Nuc stress test may be unsafe to use regadenason pharmacologic stress agent given possibility of bronchospasm with active wheezing. Treadmill not an option with his significant debility. CCTA would be nondiagnostic with very elevated HR and inability to safely give high dose BB to reduce rate. Dobutamine stress echo would be unhelpful with lung disease and likelihood of suboptimal images. Next option is cardiac catheterization. Prior to considering this, I would like to titrate medical therapy. He is not on a long acting nitrate. We will start imdur 15-30 mg daily and monitor for improvement in chest pain. He will continue to follow with pulm for thoracentesis. If chest pain not improving with imdur will consider diagnostic cath for evaluation of LM or LM equivalent disease. Presyncope concerning as well. Given nitro SL script as well, cautioned on use of other PDE-5 inhibitors at same time.   Total time of encounter: 45 minutes total time of encounter, including 30 minutes spent in face-to-face patient care on the date of this encounter. This time includes coordination of care and counseling regarding above mentioned problem list. Remainder of non-face-to-face time involved reviewing chart documents/testing relevant to the patient encounter and documentation in the medical record. I have independently reviewed documentation from referring provider.   Cherlynn Kaiser, MD, [place]   Shared Decision Making/Informed Consent:       Medication Adjustments/Labs and Tests Ordered: Current medicines are reviewed at length with the patient today.  Concerns regarding medicines are outlined above.   Orders  Placed This Encounter  Procedures   EKG 12-Lead    Meds ordered this encounter  Medications   isosorbide mononitrate (IMDUR) 30 MG 24 hr tablet    Sig: Take 0.5 tablets (15 mg total) by mouth daily.    Dispense:  30 tablet    Refill:  2   nitroGLYCERIN (NITROSTAT) 0.4 MG SL tablet    Sig: Place 1 tablet (0.4 mg total) under the tongue every 5 (five) minutes as needed for chest pain.    Dispense:  25 tablet    Refill:  3    Patient Instructions  Medication Instructions:  START: IMDUR 15mg  DAILY NITROGLYCERIN- 0.4mg  SUBLINGUAL TABLETS- MAY USE EVERY 5 MINS AS NEEDED FOR CHEST PAIN. DO NOT TAKE MORE THAN 3 TABLETS IN 15 MINUTES.  HOLD (DO NOT TAKE) LEVITRA (VARDENAFIL) WHILE YOU ARE TAKING ISOSORBIDE OR NITROGLYCERIN  NITROGLYCERIN  is a type of vasodilator. It relaxes blood vessels, increasing the blood and oxygen supply to your heart. This medicine is used to relieve chest pain caused by angina.  In an angina attack (CHEST PAIN), you should feel better within 5 minutes after your first dose. Do not swallow whole. Place tablet under your tongue. Sit down when taking this medicine. You can take a dose every 5 minutes up to a total of 3 doses. If you do not feel better or feel worse after 1 dose, call 9-1-1 at once. Do not take more than 3 doses in 15 minutes. Do not take your medicine more often than directed.  *If you need a refill on your cardiac medications before your next appointment, please call your pharmacy*  Follow-Up: At [facility], you and your health needs are our priority.  As part of our continuing mission to provide you with exceptional heart care, we have created designated Provider Care Teams.  These Care Teams include your primary  Cardiologist (physician) and Advanced Practice Providers (APPs -  Physician Assistants and Nurse Practitioners) who all work together to provide you with the care you need, when you need it.   Your next appointment:   11/15/20 at 3:40pm   The format for your next appointment:   In Person  Provider:   Cherlynn Kaiser, MD

## 2020-10-25 ENCOUNTER — Other Ambulatory Visit: Payer: Self-pay | Admitting: Pulmonary Disease

## 2020-10-25 ENCOUNTER — Ambulatory Visit (HOSPITAL_COMMUNITY)
Admission: RE | Admit: 2020-10-25 | Discharge: 2020-10-25 | Disposition: A | Discharge status: Home / Self Care | Payer: Medicare Other | Source: Ambulatory Visit | Attending: Pulmonary Disease | Admitting: Pulmonary Disease

## 2020-10-25 ENCOUNTER — Ambulatory Visit: Payer: Medicare Other

## 2020-10-25 ENCOUNTER — Other Ambulatory Visit: Payer: Self-pay

## 2020-10-25 ENCOUNTER — Ambulatory Visit (HOSPITAL_COMMUNITY)
Admission: RE | Admit: 2020-10-25 | Discharge: 2020-10-25 | Disposition: A | Discharge status: Home / Self Care | Payer: Medicare Other | Source: Ambulatory Visit | Attending: Student | Admitting: Student

## 2020-10-25 ENCOUNTER — Ambulatory Visit (HOSPITAL_COMMUNITY): Payer: Medicare Other

## 2020-10-25 DIAGNOSIS — Z48813 Encounter for surgical aftercare following surgery on the respiratory system: Secondary | ICD-10-CM | POA: Diagnosis not present

## 2020-10-25 DIAGNOSIS — J9811 Atelectasis: Secondary | ICD-10-CM | POA: Diagnosis not present

## 2020-10-25 DIAGNOSIS — J90 Pleural effusion, not elsewhere classified: Secondary | ICD-10-CM | POA: Diagnosis not present

## 2020-10-25 HISTORY — PX: IR THORACENTESIS ASP PLEURAL SPACE W/IMG GUIDE: IMG5380

## 2020-10-25 LAB — BODY FLUID CELL COUNT WITH DIFFERENTIAL
Eos, Fluid: 0 %
Lymphs, Fluid: 31 %
Monocyte-Macrophage-Serous Fluid: 33 % — ABNORMAL LOW (ref 50–90)
Neutrophil Count, Fluid: 36 % — ABNORMAL HIGH (ref 0–25)
Other Cells, Fluid: 2 %
Total Nucleated Cell Count, Fluid: 2600 cu mm — ABNORMAL HIGH (ref 0–1000)

## 2020-10-25 LAB — GLUCOSE, PLEURAL OR PERITONEAL FLUID: Glucose, Fluid: 142 mg/dL

## 2020-10-25 LAB — LACTATE DEHYDROGENASE, PLEURAL OR PERITONEAL FLUID: LD, Fluid: 122 U/L — ABNORMAL HIGH (ref 3–23)

## 2020-10-25 LAB — PROTEIN, PLEURAL OR PERITONEAL FLUID: Total protein, fluid: 4.5 g/dL

## 2020-10-25 IMAGING — DX DG CHEST 1V
1 series · 1 of 1 positions shown · non-contrast
Comparison: [DATE]

CLINICAL DATA: Pleural effusion post thoracentesis

EXAM:
CHEST  1 VIEW

[chest ap]
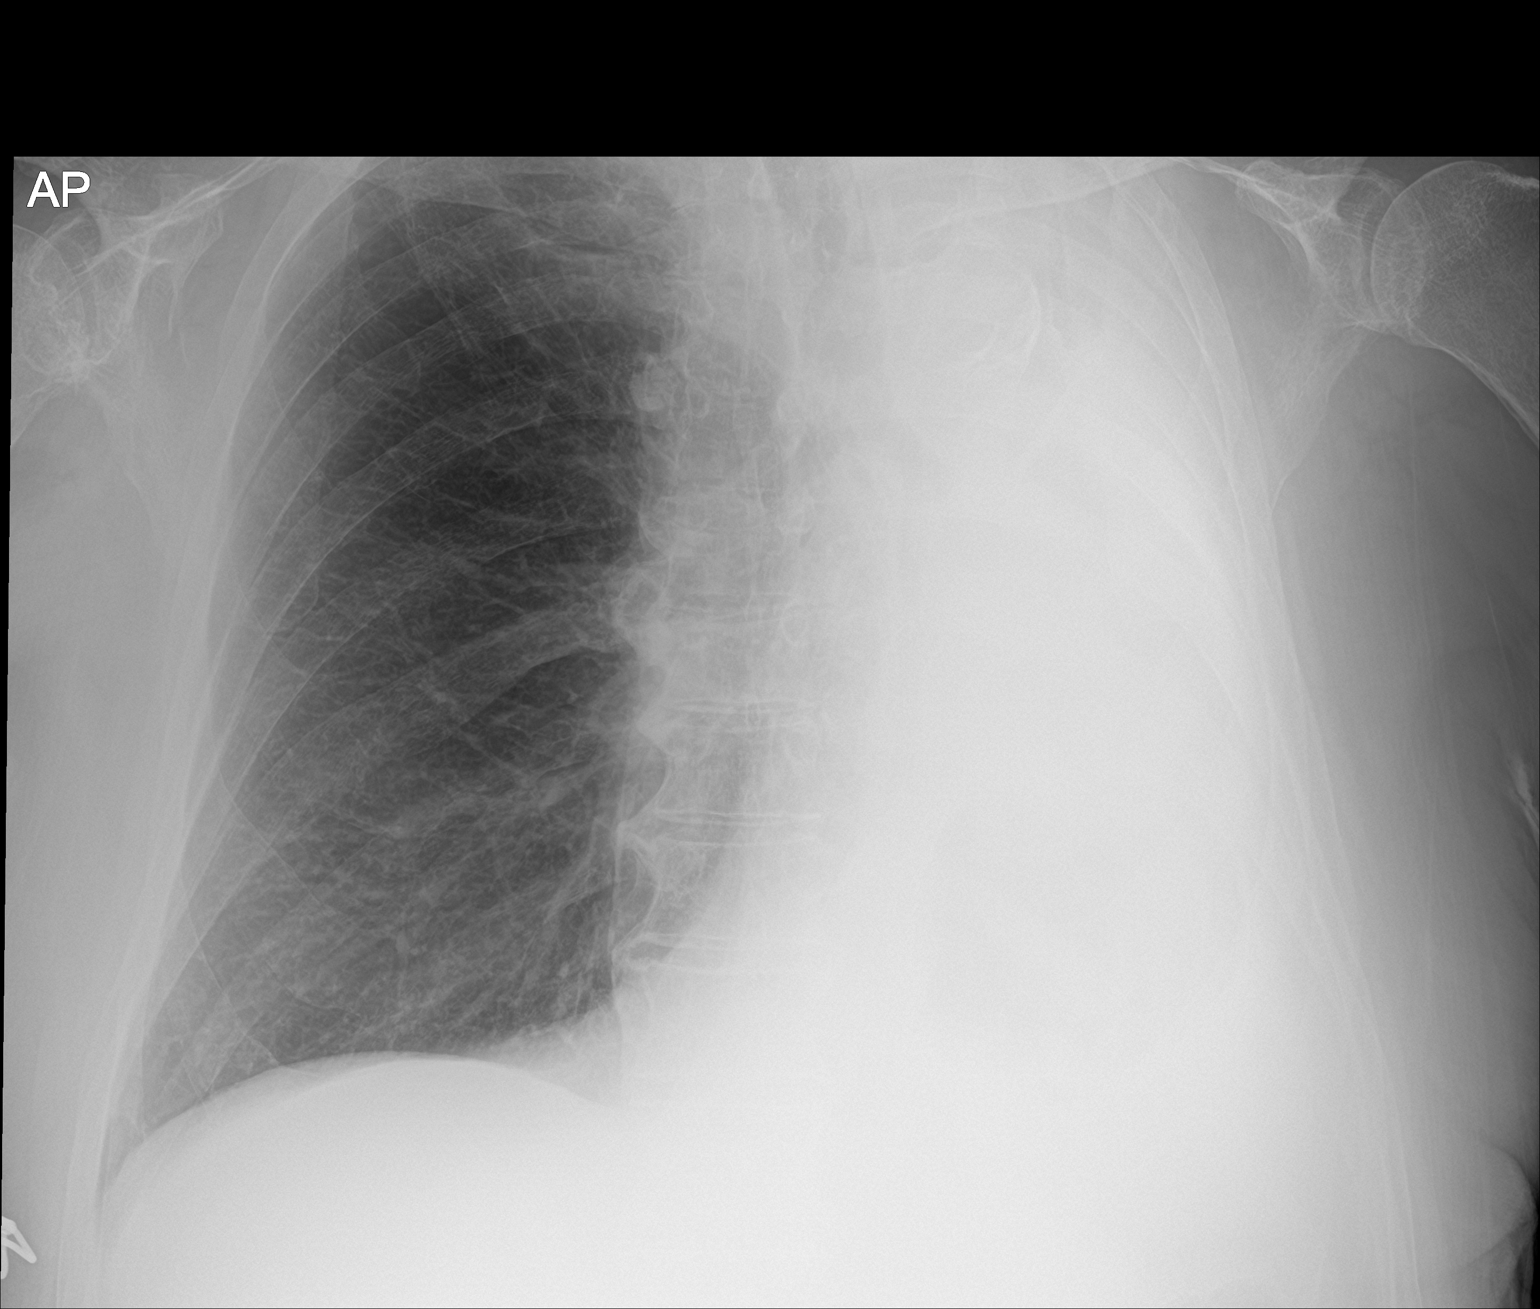

[1 of 1 positions shown; findings below may reference images not displayed]

FINDINGS: Opacification of the left hemithorax. Leftward mediastinal shift.
Right lung is clear. No pneumothorax. Cardiomediastinal contours are
poorly evaluated.
IMPRESSION: Opacification of left hemithorax presumably reflecting combination
of effusion and atelectasis. No pneumothorax.

## 2020-10-25 IMAGING — US IR THORACENTESIS ASP PLEURAL SPACE W/IMG GUIDE
1 series · 2 of 2 positions shown · non-contrast
Comparison: none

INDICATION: Patient with a history of COPD with emphysema and recurrent left
pleural effusion presents today for therapeutic and diagnostic
thoracentesis.

[Series 1: ir (id) (id)/(id)/(id) ir · 2 of 2 slices shown]
[im 1/2]
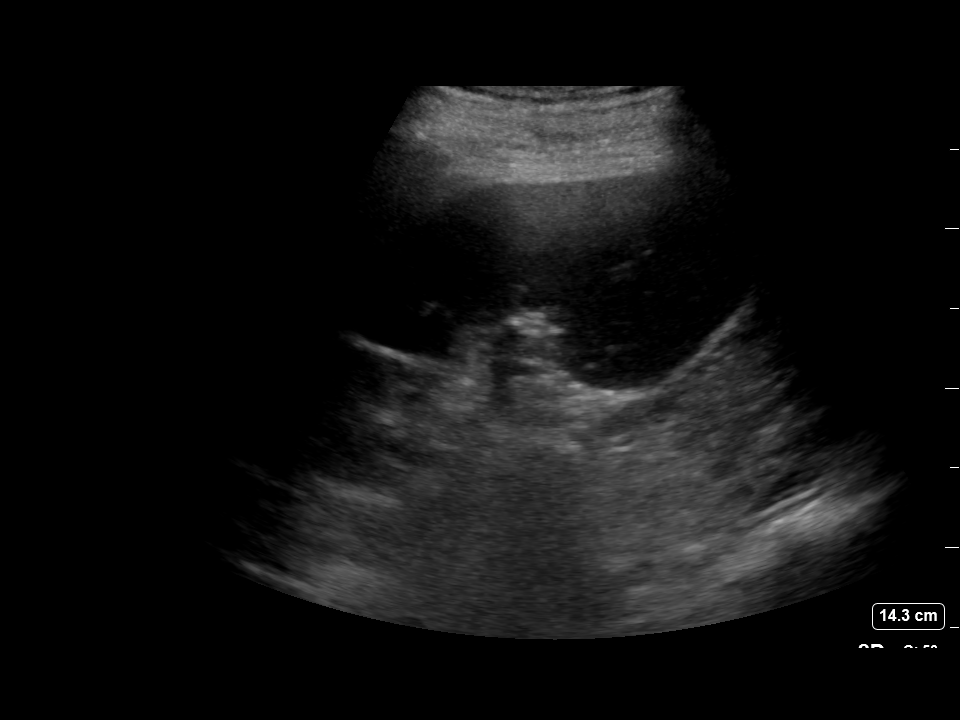
[im 2/2]
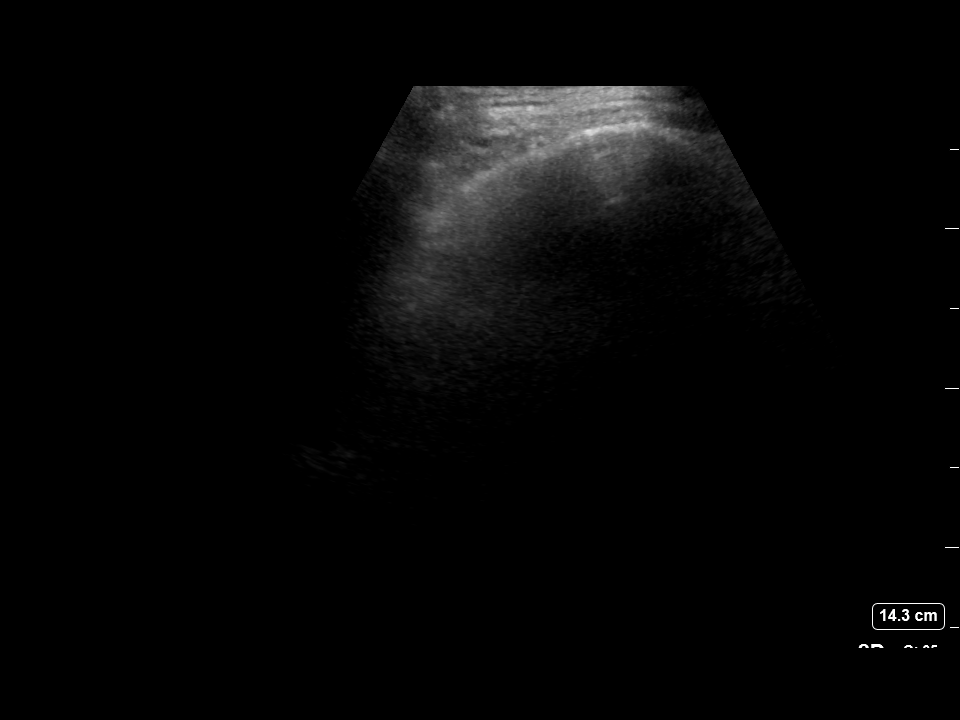

[2 of 2 positions shown; findings below may reference images not displayed]

EXAM:
ULTRASOUND GUIDED THORACENTESIS

MEDICATIONS:
1% lidocaine 20 mL

COMPLICATIONS:
None immediate.

PROCEDURE:
An ultrasound guided thoracentesis was thoroughly discussed with the
patient and questions answered. The benefits, risks, alternatives
and complications were also discussed. The patient understands and
wishes to proceed with the procedure. Written consent was obtained.

Ultrasound was performed to localize and mark an adequate pocket of
fluid in the left chest. The area was then prepped and draped in the
normal sterile fashion. 1% Lidocaine was used for local anesthesia.
Under ultrasound guidance a 6 Fr Safe-T-Centesis catheter was
introduced in the lower part of the left chest area. Initial
puncture did not yield any pleural fluid, the chest was re-examined
under ultrasound and a different pocket of fluid in the mid chest
cavity was identified. The area was prepped and draped and 1%
lidocaine was used for local anesthesia. Thoracentesis was
performed. The catheter was removed and a dressing applied.
FINDINGS: A total of approximately 400 mL of blood-tinged fluid was removed.
Samples were sent to the laboratory as requested by the clinical
team.
IMPRESSION: Successful ultrasound guided left thoracentesis yielding 400 mL of
pleural fluid. Read by: JORDEN, NP

## 2020-10-25 MED ORDER — LIDOCAINE HCL 1 % IJ SOLN
INTRAMUSCULAR | Status: AC
  Filled 2020-10-25: qty 20

## 2020-10-25 MED ORDER — LIDOCAINE HCL (PF) 1 % IJ SOLN
INTRAMUSCULAR | Status: DC | PRN
  Administered 2020-10-25: 10 mL

## 2020-10-25 NOTE — Procedures (Signed)
PROCEDURE SUMMARY:  Successful US guided left thoracentesis. Yielded 400 ml of blood-tinged fluid. Pt tolerated procedure well. No immediate complications.  Specimen sent for labs. CXR ordered; no post-procedure pneumothorax identified.   EBL < 5 mL  Theresa Duty, NP 10/25/2020 1:06 PM

## 2020-10-28 LAB — BODY FLUID CULTURE W GRAM STAIN: Culture: NO GROWTH

## 2020-10-30 ENCOUNTER — Ambulatory Visit (HOSPITAL_COMMUNITY): Payer: Medicare Other

## 2020-10-30 DIAGNOSIS — E119 Type 2 diabetes mellitus without complications: Secondary | ICD-10-CM | POA: Diagnosis not present

## 2020-10-30 DIAGNOSIS — I10 Essential (primary) hypertension: Secondary | ICD-10-CM | POA: Diagnosis not present

## 2020-10-30 DIAGNOSIS — G20 Parkinson's disease: Secondary | ICD-10-CM | POA: Diagnosis not present

## 2020-10-30 DIAGNOSIS — J9611 Chronic respiratory failure with hypoxia: Secondary | ICD-10-CM | POA: Diagnosis not present

## 2020-10-30 DIAGNOSIS — M1990 Unspecified osteoarthritis, unspecified site: Secondary | ICD-10-CM | POA: Diagnosis not present

## 2020-10-30 DIAGNOSIS — J432 Centrilobular emphysema: Secondary | ICD-10-CM | POA: Diagnosis not present

## 2020-10-30 LAB — CYTOLOGY - NON PAP

## 2020-10-31 NOTE — Progress Notes (Addendum)
PATIENT: James [name] DOB: [birth year]  REASON FOR VISIT: follow up HISTORY FROM: patient  Chief Complaint  Patient presents with   Follow-up    Rm 6, alone. Here for 6 month parkinson's f/u, pt reports things have been "going down hill". Pt has been going to the doc for the last 3 weeks for chest pn.  Pt has been going to PT 1x week.      HISTORY OF PRESENT ILLNESS:  11/01/20 ALL: James [name] returns for follow up for PD. He continues generic Sinemet IR 1.5 tablets TID and CR 25-100mg  BID. He also continues donepezil 10mg  daily for memory support. He is tolerating medications. He feels that he "continues to do down hill". Tremor continues. Worse when he is tired or agitated. He continues to have balance difficulty. He has been participating in PT for the past 2 months. He does feel that this has helped with strengthening of lower extremities. He lives in a two home with master on top level. He continues to have multiple falls. Falls are when not using cane. He has a walker as well. He seems to be eating better. He denies difficulty swallowing. He feels his memory is fine but his wife reports that she has to remind him of things all the time. He feels that he is sleeping well. He is taking mirtazapine prescribed by PCP.   He is now requiring O2 24/7. He has had two thoracentesis procedures in the past 4 weeks. He has follow up with Dr Halford Chessman next week to discuss next steps and to consider pleural drain. He is now seeing cardiology as well for chest pain. He needs a cardiac cath but not certain he can undergo procedure from respiratory standpoint. He is seeing Dr Lindi Adie, hematology, for IDA. He has recently restarted iron infusions.   05/08/2020 SA: James [name] is a 75 year old right-handed gentleman with an underlying medical history of hyperlipidemia, depression, obesity, obstructive sleep apnea, hypertension, glaucoma, osteopenia, iron deficiency anemia, hypertension, former smoker with history of  COPD/emphysema, chronic hypoxic respiratory failure, right lower lobe lung nodule and  (followed by Dr. 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  He is accompanied by his wife today.  I last saw him on 06/16/2019 for evaluation of his sleep apnea.  He had a prior diagnosis of sleep apnea and had been on CPAP in the past, he could not tolerate it.  He has been on oxygen.  He was advised to proceed with a sleep study.  His interim baseline sleep study from 07/13/2019 showed no significant obstructive sleep apnea with an AHI of 3.9/h.  Average oxygen saturation without oxygen supplementation was 88%, nadir was 83%, time below 89% saturation was 114 minutes.  The study was started without supplemental oxygen and he uses 3 L/min supplemental oxygen at home.  He was given supplemental oxygen at 1:21 AM via nasal cannula at 1 L/min and his oxygen saturations improved in the 89 to 90% range.  He was advised to follow-up with his pulmonologist and advised that he did not need CPAP therapy.   Today, 05/08/20: He reports  and he continues to have difficulty with his gait.  He uses a cane sometimes when he goes outside but not always.  He has fallen, typically when he is not using his cane.  He is dependent on oxygen at night, 3 L/min.  He follows with Dr. Halford Chessman and his nurse practitioner.  He is trying to get an oxygen concentrator  that is portable so he can go on a walk.  He does have smaller oxygen tanks but they do not last very long. He denies any significant constipation.  His wife believes his memory has become worse and he is more forgetful.  He goes to bed late, around 2 AM and gets up around 10 AM.  Generally, he takes Sinemet immediate release 3 times a day, 1-1/2 pills each time and the Sinemet CR 25-100 mg strength twice daily, at 10 AM and 2 AM.  He takes the other at 10 AM, 5 PM and midnight typically.  Sometimes he skips lunch.  Sometimes he eats a smaller meal around 2 PM.   The patient's  allergies, current medications, family history, past medical history, past social history, past surgical history and problem list were reviewed and updated as appropriate.  06/01/2019 ALL:  James [name] is a 75 y.o. male here today for follow up for PD. He continues Sinemet IR 1.5 tablets TID and Sinemet CR 25-$RemoveBefor'100mg'JLmTAEbZxKGe$  tablets BID. He reports that he has felt better since adding Sinemet CR. He feels that tremor has improved. He feels gait is stable. He denies falls. He is not using his cane. He does not feel that he needs it. Memory is fairly stable. He continues Aricept $RemoveBeforeDEI'5mg'XhVitpdSzSfhTuXr$  daily. He feels that it may be helping some and would like to increase dose. Depression is stable. He is taking Wellbutrin $RemoveBeforeDEI'300mg'YSTPYHjjMwcfIHwI$  and Zoloft $RemoveBe'150mg'BNuUokZsK$  daily. PCP manages these medications. He is eating better. He has gained 3 pounds in 3 months.   He does endorse difficulty getting to sleep and staying asleep. He does snore. He is excessively tired throughout the day. He reports having a history of sleep apnea and was on CPAP therapy years ago. He is not sure who was managing this for him. He would like to have another sleep study.   HISTORY: (copied from  note on 03/03/2019)  James [name] is a 75 y.o. male here today for follow up of PD and memory loss.  James [name] reports that since last being seen tremors seem to have worsened.  He is concerned that the increased dose of Sinemet may have caused nausea and dizziness.  He does note improvement in tremors after taking Sinemet IR.  He does have a longstanding history of dizziness with position changes and is uncertain if dizziness noted since last being seen is related to Sinemet or not.  He did start Aricept 5 mg daily.  He is uncertain if this medication has helped at all.  He denies adverse effects.  He does continue to note difficulty with word recall.  Short-term memory has worsened.  He has had multiple falls.  His wife reports that he seems to get tripped up as he shuffles his feet when  he walks.  This is worse when he is tired.  He has started using a single prong cane.  This is helped with stability.  Although he does feel that his appetite has declined somewhat, he continues to eat well.  He has lost about 9 pounds since last being seen.  He denies any difficulty swallowing.  He does not drive.  He continues to perform ADLs without assistance.   HISTORY: (copied from my note on 11/29/2018)   James Maze Kemler is a 75 y.o. male here today for follow up for Parkinson's Disease. He continues Sinemet 1.5 tablets TID. He does note more shaking. He does feel that he shakes a little less afterwards. Usual  dosing is 10a, 4p and 10p.  He has had more falls over the past year. Maybe 8-10 falls. Once he fell about 64ft on his back. He denies injuries. His wife reports that his steps are getting shorter. He participated in PT (around 04/2015) but doesn't feel that it helped.  He is more depressed. He is working with PCP and taking Zoloft $RemoveBefor'150mg'LbAAzwWuRpAd$  and Wellbutrin $RemoveBefore'300mg'pZqzxTrEBeCnP$   daily. He does feel that memory is declining. He watched a movie with his wife recently but did not remember it the next day. He is doing well otherwise. He enjoys fishing at [employer] with his son. His wife is a Microbiologist in [place].    HISTORY: (copied from [place] note on 11/26/2017)   James [name] is a 75 year old male with a history of Parkinson's disease.  He returns today for follow-up.  He is currently on Sinemet 1/2 tablet 3 times a day.  He feels overall that his tremor may have gotten slightly worse.  He notices the tremor primarily in the upper extremities and in the left leg. No change in gait he does report that his balance is off.  He states that over the summer he was getting out of the pool and he did fall.  Fortunately did not suffer any injuries.  He does not use a cane or walker.  Denies any trouble swallowing.  Denies eating choked on food or liquids.  He reports that his sleep is fragmented.  He states  that he tends to wake up every hour during the night.  For that reason he tends to nap a lot during the day.  He returns today for evaluation.   HISTORY 05/25/17 James [name] is a 75 year old male with a history of Parkinson's disease.  He returns today for follow-up.  He reports that he is doing fairly well.  Continues on Sinemet 1-1/2 tablets 3 times a day.  He reports that he has a tremor in the right hand that may have gotten slightly worse.  In regards to his gait or balance he reports that he has had more episodes of tripping but no falls.  He states that he is trying to continue doing the exercises he learned in physical therapy.  Denies any changes with the bowels or bladder.  Denies any changes with swallowing or chewing food.  Reports trouble sleeping.  Tends to go to bed around 4 AM and wake up at 8 AM and naps frequently throughout the day.  He states that he tries to stay well-hydrated with water.  Returns today for evaluation.   REVIEW OF SYSTEMS: Out of a complete 14 system review of symptoms, the patient complains only of the following symptoms, tremor, memory loss, depression, shortness of breath, chest pain, and all other reviewed systems are negative.   ALLERGIES: Allergies  Allergen Reactions   Aspirin Other (See Comments)   Codeine Itching   Morphine Itching    HOME MEDICATIONS: Outpatient Medications Prior to Visit  Medication Sig Dispense Refill   albuterol (VENTOLIN HFA) 108 (90 Base) MCG/ACT inhaler Inhale 1-2 puffs into the lungs every 6 (six) hours as needed for wheezing or shortness of breath. 8 g 6   amoxicillin-clavulanate (AUGMENTIN) 875-125 MG tablet Take 1 tablet by mouth 2 (two) times daily. 20 tablet 0   atorvastatin (LIPITOR) 20 MG tablet Take 20 mg by mouth at bedtime.      budesonide (PULMICORT) 0.5 MG/2ML nebulizer solution Take 2 mLs (0.5 mg total) by nebulization 2 (  two) times daily. 60 mL 5   buPROPion (WELLBUTRIN XL) 300 MG 24 hr tablet Take 300 mg by  mouth daily.     calcium-vitamin D (OSCAL WITH D) 250-125 MG-UNIT per tablet Take 1 tablet by mouth daily.     carbidopa-levodopa (SINEMET IR) 25-100 MG tablet Take 1.5 tablets by mouth 3 (three) times daily. 405 tablet 3   Carbidopa-Levodopa ER (SINEMET CR) 25-100 MG tablet controlled release Take 1 tablet by mouth 2 (two) times daily. 180 tablet 3   cetirizine (ZYRTEC) 10 MG tablet Take 10 mg by mouth daily.     donepezil (ARICEPT) 10 MG tablet Take 1 tablet (10 mg total) by mouth at bedtime. 90 tablet 3   ferrous gluconate (FERGON) 324 MG tablet TAKE 1 TABLET BY MOUTH DAILY WITH BREAKFAST 90 tablet 3   ferrous sulfate 325 (65 FE) MG tablet Take 325 mg by mouth 2 (two) times daily with a meal.      formoterol (PERFOROMIST) 20 MCG/2ML nebulizer solution Take 2 mLs (20 mcg total) by nebulization 2 (two) times daily. 120 mL 5   Green Tea 315 MG CAPS Take 315 mg by mouth daily.     hydrochlorothiazide (HYDRODIURIL) 25 MG tablet Take 25 mg by mouth daily.     isosorbide mononitrate (IMDUR) 30 MG 24 hr tablet Take 0.5 tablets (15 mg total) by mouth daily. 30 tablet 2   levalbuterol (XOPENEX) 0.31 MG/3ML nebulizer solution Take 3 mLs (0.31 mg total) by nebulization every 4 (four) hours as needed for wheezing. 540 mL 12   MEGARED OMEGA-3 KRILL OIL PO Take 1 capsule by mouth daily.     metFORMIN (GLUCOPHAGE) 500 MG tablet Take 1,000 mg by mouth 2 (two) times daily with a meal.      mirtazapine (REMERON) 15 MG tablet Take 15 mg by mouth at bedtime.     Multiple Vitamins-Minerals (MULTIVITAMIN WITH MINERALS) tablet Take 1 tablet by mouth daily.     nitroGLYCERIN (NITROSTAT) 0.4 MG SL tablet Place 1 tablet (0.4 mg total) under the tongue every 5 (five) minutes as needed for chest pain. 25 tablet 3   omeprazole (PRILOSEC OTC) 20 MG tablet Take 20 mg by mouth daily with breakfast.     revefenacin (YUPELRI) 175 MCG/3ML nebulizer solution Take 3 mLs (175 mcg total) by nebulization daily. 90 mL 5   sertraline  (ZOLOFT) 100 MG tablet Take 150 mg by mouth daily.     No facility-administered medications prior to visit.    PAST MEDICAL HISTORY: Past Medical History:  Diagnosis Date   Allergy    seasonal   Blind left eye    legally   Blood transfusion    2002   COPD (chronic obstructive pulmonary disease) (HCC)    Depression    Diabetes mellitus, type 2 (HCC)    Diverticulosis    ED (erectile dysfunction)    Glaucoma    Hyperlipidemia    Hypertension    Insomnia    Internal and external bleeding hemorrhoids    Iron deficiency anemia    Osteoarthritis    Rosacea    Tremor of both hands    Tubular adenoma of colon 03/28/2011    PAST SURGICAL HISTORY: Past Surgical History:  Procedure Laterality Date   ARTHROSCOPIC REPAIR ACL     [place]   right   COLONOSCOPY WITH PROPOFOL N/A 07/25/2015   Procedure: COLONOSCOPY WITH PROPOFOL;  Surgeon: [name],  MD;  Location: [facility];  Service: Endoscopy;  Laterality: N/A;   ESOPHAGOGASTRODUODENOSCOPY (EGD) WITH PROPOFOL N/A 07/25/2015   Procedure: ESOPHAGOGASTRODUODENOSCOPY (EGD) WITH PROPOFOL;  Surgeon: [name], MD;  Location: [facility];  Service: Endoscopy;  Laterality: N/A;   IR THORACENTESIS ASP PLEURAL SPACE W/IMG GUIDE  09/17/2020   IR THORACENTESIS ASP PLEURAL SPACE W/IMG GUIDE  10/25/2020   KNEE ARTHROSCOPY  1996   PARTIAL COLECTOMY  2008   THYROID CYST EXCISION     TONSILLECTOMY      FAMILY HISTORY: Family History  Problem Relation Age of Onset   CAD Father    Alcohol abuse Father    Heart disease Father    COPD Mother    Asthma Mother    Emphysema Mother    Cancer Maternal Grandmother    Colon cancer Neg Hx     SOCIAL HISTORY: Social History   Socioeconomic History   Marital status: Married    Spouse name: Not on file   Number of children: 1   Years of education: HS   Highest education level: Not on file  Occupational History   Occupation: retired     Fish farm manager: RETIRED  Tobacco Use   Smoking status: Former    Packs/day: 1.50    Years: 50.00    Pack years: 75.00    Types: Cigarettes    Quit date: 04/30/2015    Years since quitting: 5.5   Smokeless tobacco: Never   Tobacco comments:    States he sometimes sneaks a cigarette  Vaping Use   Vaping Use: Never used  Substance and Sexual Activity   Alcohol use: Yes    Alcohol/week: 0.0 standard drinks    Comment: once yearly   Drug use: No   Sexual activity: Not on file  Other Topics Concern   Not on file  Social History Narrative   Drinks 1-2 Pepsi a day    Social Determinants of Health   Financial Resource Strain: Not on file  Food Insecurity: Not on file  Transportation Needs: Not on file  Physical Activity: Not on file  Stress: Not on file  Social Connections: Not on file  Intimate Partner Violence: Not on file    PHYSICAL EXAM  Vitals:   11/01/20 1410 11/01/20 1454  BP: (!) 185/81 (!) 144/82  Pulse: 98   Weight: 203 lb 12.8 oz (92.4 kg)   Height: $Remove'6\' 1"'nzLWhrO$  (1.854 m)     Body mass index is 26.89 kg/m.  Generalized: Well developed, in no acute distress  Cardiology: normal rate and rhythm, no murmur noted Respiratory: clear to auscultation bilaterally, cyanotic nail beds  Neurological examination  Mentation: Alert oriented to time, place, history taking. Follows all commands speech and language fluent Cranial nerve II-XII: Pupils were equal round reactive to light. Extraocular movements were full, visual field were full on confrontational test. Facial sensation and strength were normal. Head turning and shoulder shrug  were normal and symmetric. Motor: The motor testing reveals 5 over 5 strength of all 4 extremities. Good symmetric motor tone is noted throughout. Moderate left hand and mild right hand tremor, mild left cog wheeling, difficulty with finger and toe taps.  Sensory: Sensory testing is intact to soft touch on all 4 extremities. No evidence of extinction is  noted.  Coordination: Cerebellar testing reveals good finger-nose-finger and heel-to-shin bilaterally.  Gait and station: Gait is short but stable with cane. Tandem not attempted.    DIAGNOSTIC DATA (LABS, IMAGING, TESTING) - I reviewed  patient records, labs, notes, testing and imaging myself where available.  MMSE - Mini Mental State Exam 11/01/2020 06/01/2019 03/03/2019  Orientation to time 5 5 4   Orientation to Place 4 5 5   Registration 3 3 3   Attention/ Calculation 5 5 4   Recall 1 2 2   Language- name 2 objects 2 2 2   Language- repeat 1 1 1   Language- follow 3 step command 3 3 3   Language- read & follow direction 1 1 1   Write a sentence 0 1 1  Write a sentence-comments - - -  Copy design 1 1 1   Copy design-comments - 8 animals 12 animals named  Total score 26 29 27      Lab Results  Component Value Date   WBC 13.0 (H) 10/22/2020   HGB 8.7 (L) 10/22/2020   HCT 30.0 (L) 10/22/2020   MCV 70.6 (L) 10/22/2020   PLT 441 (H) 10/22/2020      Component Value Date/Time   NA 141 03/18/2013 1422   K 3.9 03/18/2013 1422   CL 102 03/18/2013 1422   CO2 33 (H) 03/18/2013 1422   GLUCOSE 130 (H) 03/18/2013 1422   BUN 14 03/18/2013 1422   CREATININE 1.1 03/18/2013 1422   CALCIUM 9.4 03/18/2013 1422   GFRNONAA >60 02/08/2008 1431   GFRAA  02/08/2008 1431    >60        The eGFR has been calculated using the MDRD equation. This calculation has not been validated in all clinical   No results found for: CHOL, HDL, LDLCALC, LDLDIRECT, TRIG, CHOLHDL Lab Results  Component Value Date   HGBA1C  11/24/2006    5.6 (NOTE)   The ADA recommends the following therapeutic goals for glycemic   control related to Hgb A1C measurement:   Goal of Therapy:   < 7.0% Hgb A1C   Action Suggested:  > 8.0% Hgb A1C   Ref:  Diabetes Care, 20, Suppl. 1, 1999   Lab Results  Component Value Date   [id number] 026 06/15/2012   No results found for: TSH   ASSESSMENT AND PLAN 75 y.o. year old male  has a past  medical history of Allergy, Blind left eye, Blood transfusion, COPD (chronic obstructive pulmonary disease) ([place]), Depression, Diabetes mellitus, type 2 ([place]), Diverticulosis, ED (erectile dysfunction), Glaucoma, Hyperlipidemia, Hypertension, Insomnia, Internal and external bleeding hemorrhoids, Iron deficiency anemia, Osteoarthritis, Rosacea, Tremor of both hands, and Tubular adenoma of colon (03/28/2011). here with     ICD-10-CM   1. Parkinson's disease ([place])  G20     2. Memory loss  R41.3     3. At risk for falls  Z91.81        James [name] reports that he iis having a much more difficult time from a respiratory stand point over the past few months. He is requiring frequent centesis procedures. He needs cardiac cath but continues to work with cardiology and pulmonology to optimize health and stability for procedure. He is tolerating Sinemet IR and Sinemet CR well and without obvious adverse effects.  We will continue Sinemet IR 1.5 tablets 3 times daily and Sinemet CR twice daily.  We will continue Aricept to 10 mg daily. MMSE 26/30. He was encouraged to stay active, work on healthy lifestyle habits with well-balanced diet and regular exercise. Fall precautions reviewed.  He will follow-up closely with primary care for blood pressure management as well as depression and anxiety management.  He feels that symptoms are stable today. I will alternate his care with  Dr [name]. He verbalizes understanding and agreement with this plan.   No orders of the defined types were placed in this encounter.    No orders of the defined types were placed in this encounter.      Debbora Presto, FNP-C 11/01/2020, 4:10 PM [facility] [address] [place], [place] [address] [phone]  I reviewed the above note and documentation by the Nurse Practitioner and agree with the history, exam, assessment and plan as outlined above. I was available for consultation. Star Age, MD,  PhD [facility])

## 2020-11-01 ENCOUNTER — Ambulatory Visit (HOSPITAL_COMMUNITY): Payer: Medicare Other

## 2020-11-01 ENCOUNTER — Ambulatory Visit (INDEPENDENT_AMBULATORY_CARE_PROVIDER_SITE_OTHER): Payer: Medicare Other | Admitting: Family Medicine

## 2020-11-01 ENCOUNTER — Encounter: Payer: Self-pay | Admitting: Family Medicine

## 2020-11-01 VITALS — BP 144/82 | HR 98 | Ht 73.0 in | Wt 203.8 lb

## 2020-11-01 DIAGNOSIS — Z9181 History of falling: Secondary | ICD-10-CM | POA: Diagnosis not present

## 2020-11-01 DIAGNOSIS — R413 Other amnesia: Secondary | ICD-10-CM

## 2020-11-01 DIAGNOSIS — G20 Parkinson's disease: Secondary | ICD-10-CM

## 2020-11-01 NOTE — Patient Instructions (Signed)
Below is our plan:  We will continue Sinemet immediate release 1.5 tablets three times daily and controlled release 1 tablet twice daily. Please follow up closely with Dr Halford Chessman, cardiology, hematology and PCP.   Please make sure you are staying well hydrated. I recommend 50-60 ounces daily. Well balanced diet and regular exercise encouraged. Consistent sleep schedule with 6-8 hours recommended.   Please continue follow up with care team as directed.   Follow up with Dr [name] in 4-6 months   You may receive a survey regarding today's visit. I encourage you to leave honest feed back as I do use this information to improve patient care. Thank you for seeing me today!

## 2020-11-05 ENCOUNTER — Other Ambulatory Visit: Payer: Self-pay

## 2020-11-05 ENCOUNTER — Inpatient Hospital Stay: Payer: Medicare Other | Attending: Hematology and Oncology

## 2020-11-05 VITALS — BP 130/78 | HR 78 | Temp 98.1°F | Resp 18

## 2020-11-05 DIAGNOSIS — J432 Centrilobular emphysema: Secondary | ICD-10-CM | POA: Diagnosis not present

## 2020-11-05 DIAGNOSIS — G20 Parkinson's disease: Secondary | ICD-10-CM | POA: Diagnosis not present

## 2020-11-05 DIAGNOSIS — J9611 Chronic respiratory failure with hypoxia: Secondary | ICD-10-CM | POA: Diagnosis not present

## 2020-11-05 DIAGNOSIS — D509 Iron deficiency anemia, unspecified: Secondary | ICD-10-CM | POA: Diagnosis not present

## 2020-11-05 DIAGNOSIS — M1990 Unspecified osteoarthritis, unspecified site: Secondary | ICD-10-CM | POA: Diagnosis not present

## 2020-11-05 DIAGNOSIS — I10 Essential (primary) hypertension: Secondary | ICD-10-CM | POA: Diagnosis not present

## 2020-11-05 DIAGNOSIS — E119 Type 2 diabetes mellitus without complications: Secondary | ICD-10-CM | POA: Diagnosis not present

## 2020-11-05 MED ORDER — SODIUM CHLORIDE 0.9 % IV SOLN
300.0000 mg | Freq: Once | INTRAVENOUS | Status: AC
  Administered 2020-11-05: 300 mg via INTRAVENOUS
  Filled 2020-11-05: qty 300

## 2020-11-05 NOTE — Patient Instructions (Signed)

## 2020-11-05 NOTE — Progress Notes (Signed)
Patient refused thirty minute post obs for iron infusion. Vital signs stable

## 2020-11-06 ENCOUNTER — Ambulatory Visit (HOSPITAL_COMMUNITY): Payer: Medicare Other

## 2020-11-06 DIAGNOSIS — H53433 Sector or arcuate defects, bilateral: Secondary | ICD-10-CM | POA: Diagnosis not present

## 2020-11-06 DIAGNOSIS — Z961 Presence of intraocular lens: Secondary | ICD-10-CM | POA: Diagnosis not present

## 2020-11-06 DIAGNOSIS — E119 Type 2 diabetes mellitus without complications: Secondary | ICD-10-CM | POA: Diagnosis not present

## 2020-11-06 DIAGNOSIS — H401133 Primary open-angle glaucoma, bilateral, severe stage: Secondary | ICD-10-CM | POA: Diagnosis not present

## 2020-11-07 ENCOUNTER — Ambulatory Visit (INDEPENDENT_AMBULATORY_CARE_PROVIDER_SITE_OTHER): Payer: Medicare Other | Admitting: Acute Care

## 2020-11-07 ENCOUNTER — Encounter: Payer: Self-pay | Admitting: Acute Care

## 2020-11-07 ENCOUNTER — Other Ambulatory Visit: Payer: Self-pay

## 2020-11-07 VITALS — BP 120/62 | HR 90 | Temp 97.3°F | Ht 73.0 in | Wt 202.0 lb

## 2020-11-07 DIAGNOSIS — J90 Pleural effusion, not elsewhere classified: Secondary | ICD-10-CM | POA: Diagnosis not present

## 2020-11-07 DIAGNOSIS — G20 Parkinson's disease: Secondary | ICD-10-CM

## 2020-11-07 DIAGNOSIS — R918 Other nonspecific abnormal finding of lung field: Secondary | ICD-10-CM | POA: Diagnosis not present

## 2020-11-07 DIAGNOSIS — J9611 Chronic respiratory failure with hypoxia: Secondary | ICD-10-CM | POA: Diagnosis not present

## 2020-11-07 DIAGNOSIS — J449 Chronic obstructive pulmonary disease, unspecified: Secondary | ICD-10-CM

## 2020-11-07 DIAGNOSIS — G20A1 Parkinson's disease without dyskinesia, without mention of fluctuations: Secondary | ICD-10-CM

## 2020-11-07 DIAGNOSIS — J439 Emphysema, unspecified: Secondary | ICD-10-CM | POA: Diagnosis not present

## 2020-11-07 DIAGNOSIS — I240 Acute coronary thrombosis not resulting in myocardial infarction: Secondary | ICD-10-CM

## 2020-11-07 NOTE — Progress Notes (Signed)
History of Present Illness James [name] is a 75 y.o. 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 He is followed by Dr. Halford Chessman   11/07/2020 Pt. Presents for follow up. He continues to have dyspnea at rest. Oxygen saturations drop into the low 80-'s with minimal exertion. He has been treated with prednisone and Augmentin with no significant improvement. Persistent L exudative effusion which has been tapped x 2. There is concern for an endobronchial lesion. Despite his significant Parkinson's disease and COPD/emphysema he wants to treat whatever this is aggressively.We will dop a follow up CT of the Chest to better evaluate  .  Test Results: Pulmonary testing:  Spirometry 06/08/12 >> FEV1 1.91 (49%), FEV1% 59 Ambulatory oximetry on room air 07/07/12 >> SpO2 86%  A1AT 07/07/12 >> 154, PI-MM PFT 05/02/20 >> FEV1 2.04 (58%), FEV1% 55, TLC 7.84 (102%), DLCO 41% Lt thoracentesis 09/17/20 >> glucose 117, protein 4.3, LDH 147, WBC 1490 (73% lymphs), cytology negative, culture negative   Chest Imaging:  CT chest 03/23/13 >> mild/diffuse centrilobular emphysema, 4 mm nodule RLL superior segment, 4 mm nodule LLL CT chest 02/20/14 >> moderate centrilobular/paraseptal emphysema, mild diffuse bronchial wall thickening, 3 mm nodule RLL and 5 mm nodule LLL no change CT chest 02/13/15 >> new 1.5 cm nodule RLL. CT chest 05/28/15 >> increased RLL irregular nodule to 2 cm PET scan 06/14/15 >> 1.93 SUV RLL 11 mm lesion CT chest 11/13/15 >> no progression CT chest 05/28/16 >> no change in lung nodule CT chest 06/01/17 >> no change CT chest 05/10/20 >> mod/severe centrilobular emphysema, bronchial thickening, no change in 1.2 x 0.7 cm RLL nodule, no change 4 mm LLL nodule and 3 mm RLL nodule   Sleep Tests:  PSG 01/25/08 >> RDI 6.8, SpO2 low 84%.  Spent 79% of test time with SpO2 < 90% ONO with RA  06/16/12 >> Test time 9 hrs 21 min.  Mean SpO2 89%, low SpO2 63%.  Spent 2 hrs 25 min with SpO2 < 88% PSG 07/13/19 >> AHI 3.9, SpO2 low 83%   Cardiac Tests:  Echo 06/06/20 >> EF 60 to 65%, grade 1 DD, aortic root 45 mm   Social History:    CBC Latest Ref Rng & Units 10/22/2020 04/17/2020 10/14/2019  WBC 4.0 - 10.5 K/uL 13.0(H) 9.1 6.9  Hemoglobin 13.0 - 17.0 g/dL 8.7(L) 11.1(L) 13.1  Hematocrit 39.0 - 52.0 % 30.0(L) 37.4(L) 41.7  Platelets 150 - 400 K/uL 441(H) 308 278    BMP Latest Ref Rng & Units 03/18/2013 02/08/2008 11/24/2006  Glucose 70 - 99 mg/dL 130(H) 92 106(H)  BUN 6 - 23 mg/dL 14 8 10   Creatinine 0.4 - 1.5 mg/dL 1.1 0.92 0.92  Sodium 135 - 145 mEq/L 141 143 141  Potassium 3.5 - 5.1 mEq/L 3.9 4.1 4.8  Chloride 96 - 112 mEq/L 102 107 106  CO2 19 - 32 mEq/L 33(H) 31 29  Calcium 8.4 - 10.5 mg/dL 9.4 9.0 9.3    BNP No results found for: BNP  ProBNP No results found for: PROBNP  PFT    Component Value Date/Time   FEV1PRE 1.84 05/02/2020 1253   FEV1POST 2.04 05/02/2020 1253   FVCPRE 3.51 05/02/2020 1253   FVCPOST 3.72 05/02/2020 1253   TLC 7.84 05/02/2020 1253   DLCOUNC 11.54 05/02/2020 1253   PREFEV1FVCRT 52 05/02/2020 1253   PSTFEV1FVCRT 55 05/02/2020 1253    DG Chest  1 View  Result Date: 10/25/2020 CLINICAL DATA:  Pleural effusion post thoracentesis EXAM: CHEST  1 VIEW COMPARISON:  10/23/2020 FINDINGS: Opacification of the left hemithorax. Leftward mediastinal shift. Right lung is clear. No pneumothorax. Cardiomediastinal contours are poorly evaluated. IMPRESSION: Opacification of left hemithorax presumably reflecting combination of effusion and atelectasis. No pneumothorax. Electronically Signed   By: Macy Mis M.D.   On: 10/25/2020 11:38   DG Chest 2 View  Result Date: 10/23/2020 CLINICAL DATA:  Pleural effusion EXAM: CHEST - 2 VIEW COMPARISON:  09/17/2020 FINDINGS: New moderate to large left pleural effusion. Underlying left lung atelectasis with potential  consolidation as well. Background changes of emphysema. Cardiomediastinal contours are partially obscured. No pneumothorax. IMPRESSION: Moderate to large left pleural effusion with underlying atelectasis and potential consolidation. Electronically Signed   By: Macy Mis M.D.   On: 10/23/2020 11:15   IR THORACENTESIS ASP PLEURAL SPACE W/IMG GUIDE  Result Date: 10/25/2020 INDICATION: Patient with a history of COPD with emphysema and recurrent left pleural effusion presents today for therapeutic and diagnostic thoracentesis. EXAM: ULTRASOUND GUIDED THORACENTESIS MEDICATIONS: 1% lidocaine 20 mL COMPLICATIONS: None immediate. PROCEDURE: An ultrasound guided thoracentesis was thoroughly discussed with the patient and questions answered. The benefits, risks, alternatives and complications were also discussed. The patient understands and wishes to proceed with the procedure. Written consent was obtained. Ultrasound was performed to localize and mark an adequate pocket of fluid in the left chest. The area was then prepped and draped in the normal sterile fashion. 1% Lidocaine was used for local anesthesia. Under ultrasound guidance a 6 Fr Safe-T-Centesis catheter was introduced in the lower part of the left chest area. Initial puncture did not yield any pleural fluid, the chest was re-examined under ultrasound and a different pocket of fluid in the mid chest cavity was identified. The area was prepped and draped and 1% lidocaine was used for local anesthesia. Thoracentesis was performed. The catheter was removed and a dressing applied. FINDINGS: A total of approximately 400 mL of blood-tinged fluid was removed. Samples were sent to the laboratory as requested by the clinical team. IMPRESSION: Successful ultrasound guided left thoracentesis yielding 400 mL of pleural fluid. Read by: Soyla Dryer, NP Electronically Signed   By: Jerilynn Mages.  Shick M.D.   On: 10/25/2020 13:06     Past medical hx Past Medical History:   Diagnosis Date   Allergy    seasonal   Blind left eye    legally   Blood transfusion    2002   COPD (chronic obstructive pulmonary disease) (HCC)    Depression    Diabetes mellitus, type 2 (HCC)    Diverticulosis    ED (erectile dysfunction)    Glaucoma    Hyperlipidemia    Hypertension    Insomnia    Internal and external bleeding hemorrhoids    Iron deficiency anemia    Osteoarthritis    Rosacea    Tremor of both hands    Tubular adenoma of colon 03/28/2011     Social History   Tobacco Use   Smoking status: Former    Packs/day: 1.50    Years: 50.00    Pack years: 75.00    Types: Cigarettes    Quit date: 04/30/2015    Years since quitting: 5.5   Smokeless tobacco: Never   Tobacco comments:    States he sometimes sneaks a cigarette  Vaping Use   Vaping Use: Never used  Substance Use Topics   Alcohol use: Yes  Alcohol/week: 0.0 standard drinks    Comment: once yearly   Drug use: No    Mr.Kubicek reports that he quit smoking about 5 years ago. His smoking use included cigarettes. He has a 75.00 pack-year smoking history. He has never used smokeless tobacco. He reports current alcohol use. He reports that he does not use drugs.  Tobacco Cessation: Counseling given: Not Answered Tobacco comments: States he sometimes sneaks a cigarette   Past surgical hx, Family hx, Social hx all reviewed.  Current Outpatient Medications on File Prior to Visit  Medication Sig   albuterol (VENTOLIN HFA) 108 (90 Base) MCG/ACT inhaler Inhale 1-2 puffs into the lungs every 6 (six) hours as needed for wheezing or shortness of breath.   amoxicillin-clavulanate (AUGMENTIN) 875-125 MG tablet Take 1 tablet by mouth 2 (two) times daily.   atorvastatin (LIPITOR) 20 MG tablet Take 20 mg by mouth at bedtime.    budesonide (PULMICORT) 0.5 MG/2ML nebulizer solution Take 2 mLs (0.5 mg total) by nebulization 2 (two) times daily.   buPROPion (WELLBUTRIN XL) 300 MG 24 hr tablet Take 300 mg by  mouth daily.   calcium-vitamin D (OSCAL WITH D) 250-125 MG-UNIT per tablet Take 1 tablet by mouth daily.   carbidopa-levodopa (SINEMET IR) 25-100 MG tablet Take 1.5 tablets by mouth 3 (three) times daily.   Carbidopa-Levodopa ER (SINEMET CR) 25-100 MG tablet controlled release Take 1 tablet by mouth 2 (two) times daily.   cetirizine (ZYRTEC) 10 MG tablet Take 10 mg by mouth daily.   donepezil (ARICEPT) 10 MG tablet Take 1 tablet (10 mg total) by mouth at bedtime.   ferrous gluconate (FERGON) 324 MG tablet TAKE 1 TABLET BY MOUTH DAILY WITH BREAKFAST   ferrous sulfate 325 (65 FE) MG tablet Take 325 mg by mouth 2 (two) times daily with a meal.    formoterol (PERFOROMIST) 20 MCG/2ML nebulizer solution Take 2 mLs (20 mcg total) by nebulization 2 (two) times daily.   Green Tea 315 MG CAPS Take 315 mg by mouth daily.   hydrochlorothiazide (HYDRODIURIL) 25 MG tablet Take 25 mg by mouth daily.   isosorbide mononitrate (IMDUR) 30 MG 24 hr tablet Take 0.5 tablets (15 mg total) by mouth daily.   levalbuterol (XOPENEX) 0.31 MG/3ML nebulizer solution Take 3 mLs (0.31 mg total) by nebulization every 4 (four) hours as needed for wheezing.   MEGARED OMEGA-3 KRILL OIL PO Take 1 capsule by mouth daily.   metFORMIN (GLUCOPHAGE) 500 MG tablet Take 1,000 mg by mouth 2 (two) times daily with a meal.    mirtazapine (REMERON) 15 MG tablet Take 15 mg by mouth at bedtime.   Multiple Vitamins-Minerals (MULTIVITAMIN WITH MINERALS) tablet Take 1 tablet by mouth daily.   nitroGLYCERIN (NITROSTAT) 0.4 MG SL tablet Place 1 tablet (0.4 mg total) under the tongue every 5 (five) minutes as needed for chest pain.   omeprazole (PRILOSEC OTC) 20 MG tablet Take 20 mg by mouth daily with breakfast.   revefenacin (YUPELRI) 175 MCG/3ML nebulizer solution Take 3 mLs (175 mcg total) by nebulization daily.   sertraline (ZOLOFT) 100 MG tablet Take 150 mg by mouth daily.   No current facility-administered medications on file prior to visit.      Allergies  Allergen Reactions   Aspirin Other (See Comments)   Codeine Itching   Morphine Itching    Review Of Systems:  Constitutional:   No  weight loss, night sweats,  Fevers, chills, fatigue, or  lassitude.  HEENT:   No headaches,  Difficulty swallowing,  Tooth/dental problems, or  Sore throat,                No sneezing, itching, ear ache, nasal congestion, post nasal drip,   CV:  No chest pain,  Orthopnea, PND, swelling in lower extremities, anasarca, dizziness, palpitations, syncope.   GI  No heartburn, indigestion, abdominal pain, nausea, vomiting, diarrhea, change in bowel habits, loss of appetite, bloody stools.   Resp: No shortness of breath with exertion or at rest.  No excess mucus, no productive cough,  No non-productive cough,  No coughing up of blood.  No change in color of mucus.  No wheezing.  No chest wall deformity  Skin: no rash or lesions.  GU: no dysuria, change in color of urine, no urgency or frequency.  No flank pain, no hematuria   MS:  No joint pain or swelling.  No decreased range of motion.  No back pain.  Psych:  No change in mood or affect. No depression or anxiety.  No memory loss.   Vital Signs There were no vitals taken for this visit.   Physical Exam:  General- No distress,  A&Ox3 ENT: No sinus tenderness, TM clear, pale nasal mucosa, no oral exudate,no post nasal drip, no LAN Cardiac: S1, S2, regular rate and rhythm, no murmur Chest: No wheeze/ rales/ dullness; no accessory muscle use, no nasal flaring, no sternal retractions Abd.: Soft Non-tender Ext: No clubbing cyanosis, edema Neuro:  normal strength Skin: No rashes, warm and dry Psych: normal mood and behavior   Assessment/Plan  No problem-specific Assessment & Plan notes found for this encounter.  He has persistent Lt pleural effusion.  He had thoracentesis in July and again this month.  I treated him with prednisone and antibiotics toward the end of August.  Both  times the thoracentesis showed an exudate.  Culture and cytology negative.  His chest xray after thoracentesis this last time looks more like volume loss.  Not sure if he might have endobronchial lesion.  He has significant Parkinson's disease and COPD/emphysema.  I am not sure how aggressive he wants to be with management.  If he wants to assess further, then he needs repeat CT chest and then determine if he needs bronchoscopy versus thoracic surgery assessment.  Based on how he looked last visit, I don't think he would be able to tolerate bronchoscopy let alone thoracic surgery.  I wouldn't be surprised if he is leaning more toward palliative care.   Let me know if you have any questions.   Magdalen Spatz, NP 11/07/2020  1:39 PM

## 2020-11-07 NOTE — Progress Notes (Signed)
History of Present Illness James [name] is a 75 y.o. 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 He is followed by Dr. Halford Chessman Baseline oxygen needs are 3.5 L at all times   11/22/2020 Pt. Presents for follow up. He was last seen 10/23/2020 by Dr. Halford Chessman. At that time he was dealing with his persistent effusion. He had thoracentesis in July and again in August. He was treated with prednisone and antibiotics toward the end of August.  Both times the thoracentesis showed an exudate.  Culture and cytology negative.  His chest xray after thoracentesis looks more like volume loss.There is concern for an  endobronchial lesion. He has had progression of his Parkinson disease, and his COPD.   He states he has had less pain in his chest. His breathing is about the same. No swelling in his feet.    He did see a cardiologist, who wants to do a cardiac cath and place a stent in his Left Main Artery. She apparently will reach out to his other physicians and discuss this. He has follow up with her on ( 11/15/2020). This may improve his shortness of breath. Per his wife, the pulmonary artery is ok. He is in no distress in a wheelchair. O2 is at 3 L. Sats are 90% at present. We discussed that they should increase his oxygen to 4 L with activity, as he has been dropping to below 88%.  He did receive an iron transfusion . He has had number 2 of 3.    If he wants aggressive management , he needs repeat CT Chest to determine if he needs bronchoscopy for tissue typing and possible thoracic surgery. Goals of care will need to be discussed once tissue typing has been obtained ( if he chooses to have biopsy)   Test Results: Most Recent CT Chest 04/2020 Lungs/Pleura: Moderate to severe centrilobular emphysema. Diffuse bilateral bronchial wall thickening. Unchanged nodule of  the dependent right lung base measuring 1.2 x 0.7 cm. Unchanged 4 mm nodule of the medial superior segment left lower lobe (series 5, image 91). Unchanged 3 mm nodule of the superior segment right lower lobe (series 5, image 82). No pleural effusion or pneumothorax  Pulmonary testing:  Spirometry 06/08/12 >> FEV1 1.91 (49%), FEV1% 59 Ambulatory oximetry on room air 07/07/12 >> SpO2 86%  A1AT 07/07/12 >> 154, PI-MM PFT 05/02/20 >> FEV1 2.04 (58%), FEV1% 55, TLC 7.84 (102%), DLCO 41% Lt thoracentesis 09/17/20 >> glucose 117, protein 4.3, LDH 147, WBC 1490 (73% lymphs), cytology negative, culture negative   Chest Imaging:    CT chest 03/23/13 >> mild/diffuse centrilobular emphysema, 4 mm nodule RLL superior segment, 4 mm nodule LLL CT chest 02/20/14 >> moderate centrilobular/paraseptal emphysema, mild diffuse bronchial wall thickening, 3 mm nodule RLL and 5 mm nodule LLL no change CT chest 02/13/15 >> new 1.5 cm nodule RLL. CT chest 05/28/15 >> increased RLL irregular nodule to 2 cm PET scan 06/14/15 >> 1.93 SUV RLL 11 mm lesion CT chest 11/13/15 >> no progression CT chest 05/28/16 >> no change in lung nodule CT chest 06/01/17 >> no change CT chest 05/10/20 >> mod/severe centrilobular emphysema, bronchial thickening, no change in 1.2 x 0.7 cm RLL nodule, no change 4 mm LLL nodule and 3 mm RLL nodule   Sleep Tests:  PSG 01/25/08 >> RDI 6.8, SpO2  low 84%.  Spent 79% of test time with SpO2 < 90% ONO with RA 06/16/12 >> Test time 9 hrs 21 min.  Mean SpO2 89%, low SpO2 63%.  Spent 2 hrs 25 min with SpO2 < 88% PSG 07/13/19 >> AHI 3.9, SpO2 low 83%   Cardiac Tests:  Echo 06/06/20 >> EF 60 to 65%, grade 1 DD, aortic root 45 mm    CBC Latest Ref Rng & Units 10/22/2020 04/17/2020 10/14/2019  WBC 4.0 - 10.5 K/uL 13.0(H) 9.1 6.9  Hemoglobin 13.0 - 17.0 g/dL 8.7(L) 11.1(L) 13.1  Hematocrit 39.0 - 52.0 % 30.0(L) 37.4(L) 41.7  Platelets 150 - 400 K/uL 441(H) 308 278    BMP Latest Ref Rng & Units 03/18/2013  02/08/2008 11/24/2006  Glucose 70 - 99 mg/dL 130(H) 92 106(H)  BUN 6 - 23 mg/dL 14 8 10   Creatinine 0.4 - 1.5 mg/dL 1.1 0.92 0.92  Sodium 135 - 145 mEq/L 141 143 141  Potassium 3.5 - 5.1 mEq/L 3.9 4.1 4.8  Chloride 96 - 112 mEq/L 102 107 106  CO2 19 - 32 mEq/L 33(H) 31 29  Calcium 8.4 - 10.5 mg/dL 9.4 9.0 9.3    BNP No results found for: BNP  ProBNP No results found for: PROBNP  PFT    Component Value Date/Time   FEV1PRE 1.84 05/02/2020 1253   FEV1POST 2.04 05/02/2020 1253   FVCPRE 3.51 05/02/2020 1253   FVCPOST 3.72 05/02/2020 1253   TLC 7.84 05/02/2020 1253   DLCOUNC 11.54 05/02/2020 1253   PREFEV1FVCRT 52 05/02/2020 1253   PSTFEV1FVCRT 55 05/02/2020 1253    DG Chest 1 View  Result Date: 10/25/2020 CLINICAL DATA:  Pleural effusion post thoracentesis EXAM: CHEST  1 VIEW COMPARISON:  10/23/2020 FINDINGS: Opacification of the left hemithorax. Leftward mediastinal shift. Right lung is clear. No pneumothorax. Cardiomediastinal contours are poorly evaluated. IMPRESSION: Opacification of left hemithorax presumably reflecting combination of effusion and atelectasis. No pneumothorax. Electronically Signed   By: Macy Mis M.D.   On: 10/25/2020 11:38   CT Chest Wo Contrast  Result Date: 11/19/2020 CLINICAL DATA:  75 year old male with history of pulmonary nodules. COPD. EXAM: CT CHEST WITHOUT CONTRAST TECHNIQUE: Multidetector CT imaging of the chest was performed following the standard protocol without IV contrast. COMPARISON:  Chest CT 05/10/2020. FINDINGS: Cardiovascular: Heart size is normal. There is no significant pericardial fluid, thickening or pericardial calcification. There is aortic atherosclerosis, as well as atherosclerosis of the great vessels of the mediastinum and the coronary arteries, including calcified atherosclerotic plaque in the left main, left anterior descending, left circumflex and right coronary arteries. Mediastinum/Nodes: No pathologically enlarged  mediastinal or hilar lymph nodes. Please note that accurate exclusion of hilar adenopathy is limited on noncontrast CT scans. Esophagus is unremarkable in appearance. No axillary lymphadenopathy. Lungs/Pleura: There appears to be a combination of collapse and consolidation throughout the left lung. In the central aspect of the left lower lobe (axial image 77 of series 2) there is a mass-like area that is slightly lower attenuation the in the surrounding lung, which is associated with abrupt cutoff of the left lower lobe bronchus, concerning for potential neoplasm (although this could simply represent an area of developing parenchymal necrosis). Moderate left parapneumonic pleural effusion. Right lung is clear. No right pleural effusion. In the posterior aspect of the right lower lobe (axial image 122 of series 3) there is an elongated area of architectural distortion measuring 17 x 5 mm which is less solid in appearance than prior examination from  05/10/2020, presumably an area of scarring at the site of the previously noted infectious/inflammatory nodule. 3 mm right lower lobe pulmonary nodule (axial image 111 of series 3), stable, considered definitively benign. No definite suspicious appearing pulmonary nodules or masses are otherwise noted in the well aerated portions of the lungs. Diffuse bronchial wall thickening with moderate to severe centrilobular and paraseptal emphysema. Upper Abdomen: Left kidney is incompletely imaged, but there appears to be some left-sided hydronephrosis. Low-attenuation lesion extending off the lateral aspect of the upper pole of the left kidney, incompletely imaged and not characterized on today's non-contrast CT examination, but statistically likely to represent a cyst. Aortic atherosclerosis. Musculoskeletal: There are no aggressive appearing lytic or blastic lesions noted in the visualized portions of the skeleton. IMPRESSION: 1. Possible obstructing central neoplasm in the left  lower lobe with postobstructive atelectasis and airspace consolidation throughout the left lung and moderate parapneumonic left pleural effusion. Given the central location of this suspected lesion, further evaluation with bronchoscopy is suggested if clinically appropriate. Alternatively, PET-CT could be considered. 2. Aortic atherosclerosis, in addition to left main and 3 vessel coronary artery disease. Assessment for potential risk factor modification, dietary therapy or pharmacologic therapy may be warranted, if clinically indicated. 3. Possible left-sided hydronephrosis incompletely imaged. This could be further evaluated with renal ultrasound. These results will be called to the ordering clinician or representative by the Radiologist Assistant, and communication documented in the PACS or [employer]. Aortic atherosclerosis. Electronically Signed   By: Vinnie Langton M.D.   On: 11/19/2020 12:21   IR THORACENTESIS ASP PLEURAL SPACE W/IMG GUIDE  Result Date: 10/25/2020 INDICATION: Patient with a history of COPD with emphysema and recurrent left pleural effusion presents today for therapeutic and diagnostic thoracentesis. EXAM: ULTRASOUND GUIDED THORACENTESIS MEDICATIONS: 1% lidocaine 20 mL COMPLICATIONS: None immediate. PROCEDURE: An ultrasound guided thoracentesis was thoroughly discussed with the patient and questions answered. The benefits, risks, alternatives and complications were also discussed. The patient understands and wishes to proceed with the procedure. Written consent was obtained. Ultrasound was performed to localize and mark an adequate pocket of fluid in the left chest. The area was then prepped and draped in the normal sterile fashion. 1% Lidocaine was used for local anesthesia. Under ultrasound guidance a 6 Fr Safe-T-Centesis catheter was introduced in the lower part of the left chest area. Initial puncture did not yield any pleural fluid, the chest was re-examined under ultrasound  and a different pocket of fluid in the mid chest cavity was identified. The area was prepped and draped and 1% lidocaine was used for local anesthesia. Thoracentesis was performed. The catheter was removed and a dressing applied. FINDINGS: A total of approximately 400 mL of blood-tinged fluid was removed. Samples were sent to the laboratory as requested by the clinical team. IMPRESSION: Successful ultrasound guided left thoracentesis yielding 400 mL of pleural fluid. Read by: Soyla Dryer, NP Electronically Signed   By: Jerilynn Mages.  Shick M.D.   On: 10/25/2020 13:06     Past medical hx Past Medical History:  Diagnosis Date   Allergy    seasonal   Blind left eye    legally   Blood transfusion    2002   COPD (chronic obstructive pulmonary disease) ([place])    Depression    Diabetes mellitus, type 2 ([place])    Diverticulosis    ED (erectile dysfunction)    Glaucoma    Hyperlipidemia    Hypertension    Insomnia    Internal and external bleeding  hemorrhoids    Iron deficiency anemia    Osteoarthritis    Rosacea    Tremor of both hands    Tubular adenoma of colon 03/28/2011     Social History   Tobacco Use   Smoking status: Former    Packs/day: 1.50    Years: 50.00    Pack years: 75.00    Types: Cigarettes    Quit date: 04/30/2015    Years since quitting: 5.5   Smokeless tobacco: Never   Tobacco comments:    States he sometimes sneaks a cigarette  Vaping Use   Vaping Use: Never used  Substance Use Topics   Alcohol use: Yes    Alcohol/week: 0.0 standard drinks    Comment: once yearly   Drug use: No    Mr.Polendo reports that he quit smoking about 5 years ago. His smoking use included cigarettes. He has a 75.00 pack-year smoking history. He has never used smokeless tobacco. He reports current alcohol use. He reports that he does not use drugs.  Tobacco Cessation: Former smoker quit 2017 with a 75 pack year smoking history  Past surgical hx, Family hx, Social hx all  reviewed.  Current Outpatient Medications on File Prior to Visit  Medication Sig   albuterol (VENTOLIN HFA) 108 (90 Base) MCG/ACT inhaler Inhale 1-2 puffs into the lungs every 6 (six) hours as needed for wheezing or shortness of breath.   atorvastatin (LIPITOR) 20 MG tablet Take 20 mg by mouth at bedtime.    budesonide (PULMICORT) 0.5 MG/2ML nebulizer solution Take 2 mLs (0.5 mg total) by nebulization 2 (two) times daily.   buPROPion (WELLBUTRIN XL) 300 MG 24 hr tablet Take 300 mg by mouth daily.   calcium-vitamin D (OSCAL WITH D) 250-125 MG-UNIT per tablet Take 1 tablet by mouth daily.   carbidopa-levodopa (SINEMET IR) 25-100 MG tablet Take 1.5 tablets by mouth 3 (three) times daily.   Carbidopa-Levodopa ER (SINEMET CR) 25-100 MG tablet controlled release Take 1 tablet by mouth 2 (two) times daily.   cetirizine (ZYRTEC) 10 MG tablet Take 10 mg by mouth daily.   donepezil (ARICEPT) 10 MG tablet Take 1 tablet (10 mg total) by mouth at bedtime.   ferrous gluconate (FERGON) 324 MG tablet TAKE 1 TABLET BY MOUTH DAILY WITH BREAKFAST   ferrous sulfate 325 (65 FE) MG tablet Take 325 mg by mouth 2 (two) times daily with a meal.    formoterol (PERFOROMIST) 20 MCG/2ML nebulizer solution Take 2 mLs (20 mcg total) by nebulization 2 (two) times daily.   Green Tea 315 MG CAPS Take 315 mg by mouth daily.   hydrochlorothiazide (HYDRODIURIL) 25 MG tablet Take 25 mg by mouth daily.   levalbuterol (XOPENEX) 0.31 MG/3ML nebulizer solution Take 3 mLs (0.31 mg total) by nebulization every 4 (four) hours as needed for wheezing.   MEGARED OMEGA-3 KRILL OIL PO Take 1 capsule by mouth daily.   metFORMIN (GLUCOPHAGE) 500 MG tablet Take 1,000 mg by mouth 2 (two) times daily with a meal.    mirtazapine (REMERON) 15 MG tablet Take 15 mg by mouth at bedtime.   Multiple Vitamins-Minerals (MULTIVITAMIN WITH MINERALS) tablet Take 1 tablet by mouth daily.   nitroGLYCERIN (NITROSTAT) 0.4 MG SL tablet Place 1 tablet (0.4 mg  total) under the tongue every 5 (five) minutes as needed for chest pain.   omeprazole (PRILOSEC OTC) 20 MG tablet Take 20 mg by mouth daily with breakfast.   Oyster Shell Calcium 500 MG TABS  revefenacin (YUPELRI) 175 MCG/3ML nebulizer solution Take 3 mLs (175 mcg total) by nebulization daily.   sertraline (ZOLOFT) 100 MG tablet Take 150 mg by mouth daily.   No current facility-administered medications on file prior to visit.     Allergies  Allergen Reactions   Aspirin Other (See Comments)   Codeine Itching   Morphine Itching    Review Of Systems:  Constitutional:   No  weight loss, night sweats,  Fevers, chills, fatigue, or  lassitude.  HEENT:   No headaches,  Difficulty swallowing,  Tooth/dental problems, or  Sore throat,                No sneezing, itching, ear ache, nasal congestion, post nasal drip,   CV:  No chest pain,  Orthopnea, PND, swelling in lower extremities, anasarca, dizziness, palpitations, syncope.   GI  No heartburn, indigestion, abdominal pain, nausea, vomiting, diarrhea, change in bowel habits, loss of appetite, bloody stools.   Resp: No shortness of breath with exertion or at rest.  No excess mucus, no productive cough,  No non-productive cough,  No coughing up of blood.  No change in color of mucus.  No wheezing.  No chest wall deformity  Skin: no rash or lesions.  GU: no dysuria, change in color of urine, no urgency or frequency.  No flank pain, no hematuria   MS:  No joint pain or swelling.  No decreased range of motion.  No back pain.  Psych:  No change in mood or affect. No depression or anxiety.  No memory loss.   Vital Signs BP 120/62 (BP Location: Right Arm, Cuff Size: Normal)   Pulse 90   Temp (!) 97.3 F (36.3 C) (Oral)   Ht 6\' 1"  (1.854 m)   Wt 202 lb (91.6 kg)   SpO2 90%   BMI 26.65 kg/m    Physical Exam:  General- No distress,  A&Ox3, in wheelchair ENT: No sinus tenderness, TM clear, pale nasal mucosa, no oral exudate,no post  nasal drip, no LAN Cardiac: S1, S2, regular rate and rhythm, no murmur Chest: No wheeze/ rales/ dullness; no accessory muscle use, no nasal flaring, no sternal retractions, diminished per bases Abd.: Soft Non-tender, ND, BS +, Body mass index is 26.65 kg/m. Ext: No clubbing cyanosis, edema Neuro:  Physically deconditioned Skin: No rashes, warm and dry Psych: normal mood and behavior   Assessment/Plan  Left Pleural Effusion Thoracentesis x 2>> Exudative Resolved Plan We will order a CT Chest without contrast. You will get a call to schedule this Follow up with cardiology as you have scheduled.  Follow up with Dr. Halford Chessman or [name] NP after CT Chest ( schedule for 1 month, can move up if needed)  Please contact office for sooner follow up if symptoms do not improve or worsen or seek emergency care   Call us if you need [place] sooner    Chronic Respiratory Failure 2/2 COPD Plan Increase oxygen to 4 L with activity, 3 L at rest. Remember saturation goals are 88%.   COPD with Emphysema Continue Yupelri and Performist  and Pulmicort nebs as you have been doing.  Prn Xopenex as rescue Wear oxygen as above  Right Lower Lung Mass Plan Follow up CT Chest  May need additional diagnostic procedures    Parkinson's Disease Follow up with Dr.Saima Athar with [facility]  I  spent 45 minutes dedicated to the care of this patient on the date of this encounter to include pre-visit review of  records, face-to-face time with the patient discussing conditions above, post visit ordering of testing, clinical documentation with the electronic health record, making appropriate referrals as documented, and communicating necessary information to the patient's healthcare team.    Magdalen Spatz, NP 11/22/2020  9:52 PM  Addendum 11/22/2020 CT Chest 11/17/2020  Possible obstructing central neoplasm in the left lower lobe with postobstructive atelectasis and airspace consolidation throughout the  left lung and moderate parapneumonic left pleural effusion. Given the central location of this suspected lesion, further evaluation with bronchoscopy is suggested if clinically appropriate. Alternatively, PET-CT could be considered. 2. Aortic atherosclerosis, in addition to left main and 3 vessel coronary artery disease. Assessment for potential risk factor modification, dietary therapy or pharmacologic therapy may be warranted, if clinically indicated. 3. Possible left-sided hydronephrosis incompletely imaged. This could be further evaluated with renal ultrasound.  Will discuss with Dr. Halford Chessman >> Needs Bronch but will need to weigh risk vs benefit of this procedure in this patient.     Magdalen Spatz, MSN, AGACNP-BC [place] See Amion for personal pager PCCM on call pager [phone]

## 2020-11-07 NOTE — Patient Instructions (Addendum)
It is good to see you today. We will order a CT Chest without contrast. You will get a call to schedule this Continue Yupelri and Performist nebs as you have been doing.  Increase oxygen to 4 L with activity, 3 L at rest. Remember saturation goals are 88%.  Follow up with cardiology as you have scheduled.  Follow up with Dr. Halford Chessman or [name] NP after CT Chest ( schedule for 1 month, can move up if needed)  Please contact office for sooner follow up if symptoms do not improve or worsen or seek emergency care   Call us if you need [place] sooner

## 2020-11-08 ENCOUNTER — Ambulatory Visit (HOSPITAL_COMMUNITY): Payer: Medicare Other

## 2020-11-12 ENCOUNTER — Inpatient Hospital Stay: Payer: Medicare Other

## 2020-11-12 ENCOUNTER — Other Ambulatory Visit: Payer: Self-pay

## 2020-11-12 VITALS — BP 141/72 | HR 89 | Temp 98.6°F | Resp 18

## 2020-11-12 DIAGNOSIS — D509 Iron deficiency anemia, unspecified: Secondary | ICD-10-CM

## 2020-11-12 MED ORDER — SODIUM CHLORIDE 0.9 % IV SOLN: Freq: Once | INTRAVENOUS | Status: AC

## 2020-11-12 MED ORDER — SODIUM CHLORIDE 0.9 % IV SOLN
300.0000 mg | Freq: Once | INTRAVENOUS | Status: AC
  Administered 2020-11-12: 300 mg via INTRAVENOUS
  Filled 2020-11-12: qty 300

## 2020-11-12 NOTE — Patient Instructions (Signed)

## 2020-11-12 NOTE — Progress Notes (Signed)
Pt refused to stay for 30 minute post Venofer infusion observation.  Tolerated treatment well.  VSS at discharge.  Wheelchair assist to lobby.

## 2020-11-13 ENCOUNTER — Ambulatory Visit (HOSPITAL_COMMUNITY): Payer: Medicare Other

## 2020-11-13 DIAGNOSIS — M1990 Unspecified osteoarthritis, unspecified site: Secondary | ICD-10-CM | POA: Diagnosis not present

## 2020-11-13 DIAGNOSIS — E119 Type 2 diabetes mellitus without complications: Secondary | ICD-10-CM | POA: Diagnosis not present

## 2020-11-13 DIAGNOSIS — J432 Centrilobular emphysema: Secondary | ICD-10-CM | POA: Diagnosis not present

## 2020-11-13 DIAGNOSIS — J9611 Chronic respiratory failure with hypoxia: Secondary | ICD-10-CM | POA: Diagnosis not present

## 2020-11-13 DIAGNOSIS — G20 Parkinson's disease: Secondary | ICD-10-CM | POA: Diagnosis not present

## 2020-11-13 DIAGNOSIS — I10 Essential (primary) hypertension: Secondary | ICD-10-CM | POA: Diagnosis not present

## 2020-11-14 ENCOUNTER — Telehealth: Payer: Self-pay | Admitting: Family Medicine

## 2020-11-14 ENCOUNTER — Ambulatory Visit: Payer: Medicare Other | Admitting: Family Medicine

## 2020-11-14 NOTE — Telephone Encounter (Signed)
Iris Pert from [facility] called to give VO for in home PT order is 1 week 9. If there any questions Claiborne Billings can be reached on her cell [phone].

## 2020-11-14 NOTE — Telephone Encounter (Signed)
Called and LVM providing VO. Asked her to call back if she has any other questions/concerns.

## 2020-11-15 ENCOUNTER — Ambulatory Visit (HOSPITAL_COMMUNITY): Payer: Medicare Other

## 2020-11-15 ENCOUNTER — Encounter: Payer: Self-pay | Admitting: Internal Medicine

## 2020-11-15 ENCOUNTER — Ambulatory Visit (INDEPENDENT_AMBULATORY_CARE_PROVIDER_SITE_OTHER): Payer: Medicare Other | Admitting: Internal Medicine

## 2020-11-15 ENCOUNTER — Other Ambulatory Visit: Payer: Self-pay

## 2020-11-15 VITALS — BP 150/70 | HR 105 | Ht 73.0 in | Wt 205.0 lb

## 2020-11-15 DIAGNOSIS — D500 Iron deficiency anemia secondary to blood loss (chronic): Secondary | ICD-10-CM | POA: Diagnosis not present

## 2020-11-15 DIAGNOSIS — J439 Emphysema, unspecified: Secondary | ICD-10-CM | POA: Diagnosis not present

## 2020-11-15 DIAGNOSIS — R195 Other fecal abnormalities: Secondary | ICD-10-CM

## 2020-11-15 DIAGNOSIS — E785 Hyperlipidemia, unspecified: Secondary | ICD-10-CM | POA: Diagnosis not present

## 2020-11-15 DIAGNOSIS — I10 Essential (primary) hypertension: Secondary | ICD-10-CM | POA: Diagnosis not present

## 2020-11-15 DIAGNOSIS — R079 Chest pain, unspecified: Secondary | ICD-10-CM | POA: Diagnosis not present

## 2020-11-15 MED ORDER — ISOSORBIDE MONONITRATE ER 30 MG PO TB24: 30.0000 mg | ORAL_TABLET | Freq: Every day | ORAL | 3 refills | Status: DC

## 2020-11-15 NOTE — Patient Instructions (Signed)
Medication Instructions:  No Changes In Medications at this time.  *If you need a refill on your cardiac medications before your next appointment, please call your pharmacy*  Follow-Up: At [facility], you and your health needs are our priority.  As part of our continuing mission to provide you with exceptional heart care, we have created designated Provider Care Teams.  These Care Teams include your primary Cardiologist (physician) and Advanced Practice Providers (APPs -  Physician Assistants and Nurse Practitioners) who all work together to provide you with the care you need, when you need it.  Your next appointment:   3 month(s)  The format for your next appointment:   In Person  Provider:   Dr. Ellyn Hack or Dr. Percival Spanish

## 2020-11-17 ENCOUNTER — Ambulatory Visit
Admission: RE | Admit: 2020-11-17 | Discharge: 2020-11-17 | Disposition: A | Discharge status: Home / Self Care | Payer: Medicare Other | Source: Ambulatory Visit | Attending: Acute Care | Admitting: Acute Care

## 2020-11-17 DIAGNOSIS — J449 Chronic obstructive pulmonary disease, unspecified: Secondary | ICD-10-CM | POA: Diagnosis not present

## 2020-11-17 DIAGNOSIS — I700 Atherosclerosis of aorta: Secondary | ICD-10-CM | POA: Diagnosis not present

## 2020-11-17 DIAGNOSIS — K5790 Diverticulosis of intestine, part unspecified, without perforation or abscess without bleeding: Secondary | ICD-10-CM | POA: Diagnosis not present

## 2020-11-17 DIAGNOSIS — G20 Parkinson's disease: Secondary | ICD-10-CM | POA: Diagnosis not present

## 2020-11-17 DIAGNOSIS — J90 Pleural effusion, not elsewhere classified: Secondary | ICD-10-CM | POA: Diagnosis not present

## 2020-11-17 DIAGNOSIS — I10 Essential (primary) hypertension: Secondary | ICD-10-CM | POA: Diagnosis not present

## 2020-11-17 DIAGNOSIS — F32A Depression, unspecified: Secondary | ICD-10-CM | POA: Diagnosis not present

## 2020-11-17 DIAGNOSIS — J9611 Chronic respiratory failure with hypoxia: Secondary | ICD-10-CM | POA: Diagnosis not present

## 2020-11-17 DIAGNOSIS — Z9181 History of falling: Secondary | ICD-10-CM | POA: Diagnosis not present

## 2020-11-17 DIAGNOSIS — D649 Anemia, unspecified: Secondary | ICD-10-CM | POA: Diagnosis not present

## 2020-11-17 DIAGNOSIS — R918 Other nonspecific abnormal finding of lung field: Secondary | ICD-10-CM | POA: Diagnosis not present

## 2020-11-17 DIAGNOSIS — R296 Repeated falls: Secondary | ICD-10-CM | POA: Diagnosis not present

## 2020-11-17 DIAGNOSIS — E785 Hyperlipidemia, unspecified: Secondary | ICD-10-CM | POA: Diagnosis not present

## 2020-11-17 DIAGNOSIS — J439 Emphysema, unspecified: Secondary | ICD-10-CM | POA: Diagnosis not present

## 2020-11-17 DIAGNOSIS — R911 Solitary pulmonary nodule: Secondary | ICD-10-CM | POA: Diagnosis not present

## 2020-11-17 DIAGNOSIS — H409 Unspecified glaucoma: Secondary | ICD-10-CM | POA: Diagnosis not present

## 2020-11-17 DIAGNOSIS — J432 Centrilobular emphysema: Secondary | ICD-10-CM | POA: Diagnosis not present

## 2020-11-17 DIAGNOSIS — Z9981 Dependence on supplemental oxygen: Secondary | ICD-10-CM | POA: Diagnosis not present

## 2020-11-17 DIAGNOSIS — M1990 Unspecified osteoarthritis, unspecified site: Secondary | ICD-10-CM | POA: Diagnosis not present

## 2020-11-17 DIAGNOSIS — Z7984 Long term (current) use of oral hypoglycemic drugs: Secondary | ICD-10-CM | POA: Diagnosis not present

## 2020-11-17 DIAGNOSIS — E119 Type 2 diabetes mellitus without complications: Secondary | ICD-10-CM | POA: Diagnosis not present

## 2020-11-17 DIAGNOSIS — H5462 Unqualified visual loss, left eye, normal vision right eye: Secondary | ICD-10-CM | POA: Diagnosis not present

## 2020-11-17 DIAGNOSIS — Z87891 Personal history of nicotine dependence: Secondary | ICD-10-CM | POA: Diagnosis not present

## 2020-11-17 IMAGING — CT CT CHEST W/O CM
1 series · 13 of 34 positions shown, 17 images · non-contrast
Comparison: Chest CT [DATE].

CLINICAL DATA: 75-year-old male with history of pulmonary nodules.
COPD.

EXAM:
CT CHEST WITHOUT CONTRAST
TECHNIQUE: Multidetector CT imaging of the chest was performed following the
standard protocol without IV contrast.

[Series 2: chest w/(date) · axial · 0.81mm/px · z∈[-317,-17]mm · 13 of 176 slices shown, 17 images]
[im 13/176  mediastinal]
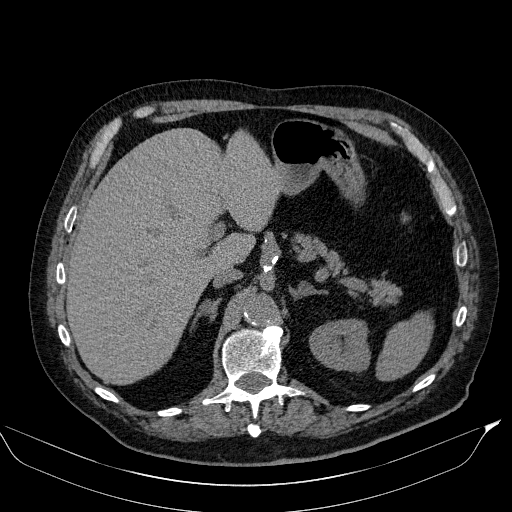
[im 13/176  lung]
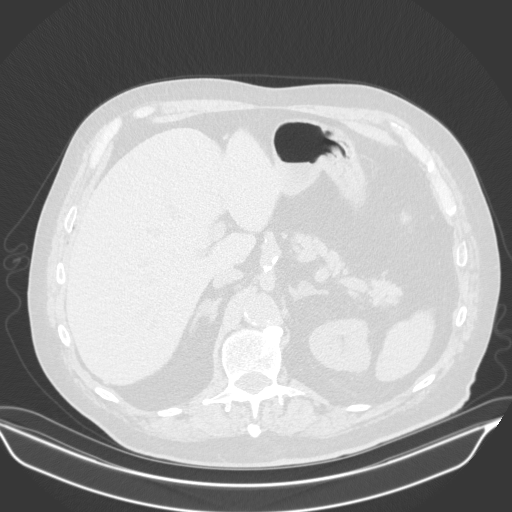
[im 26/176  lung]
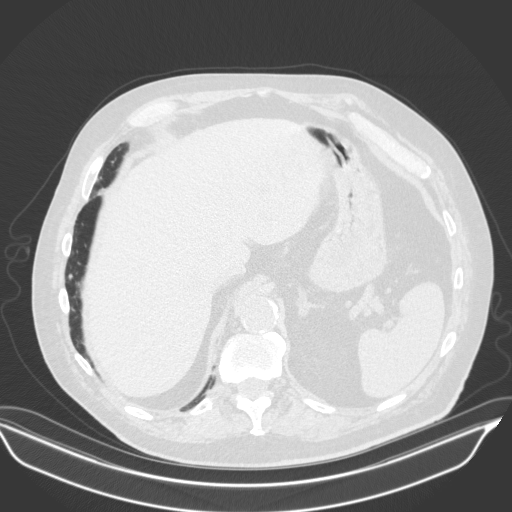
[im 39/176  lung]
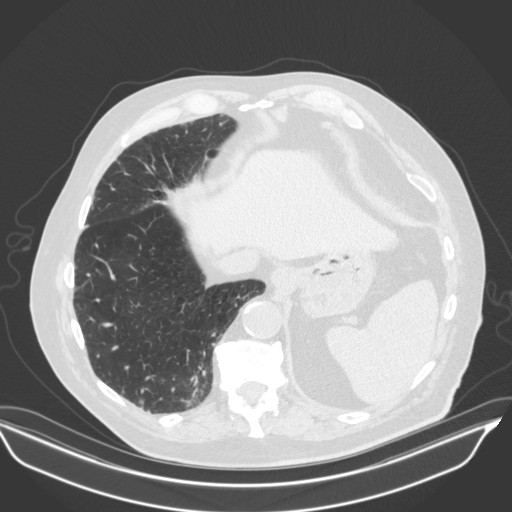
[im 52/176  lung]
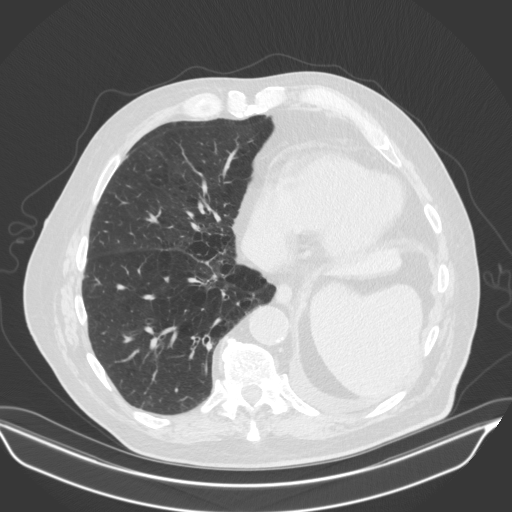
[im 71/176  mediastinal]
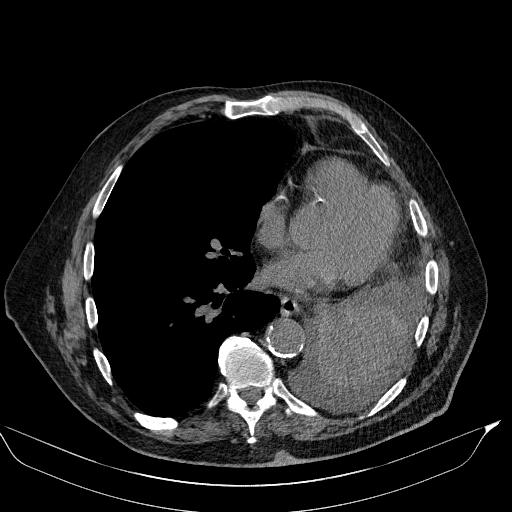
[im 71/176  lung]
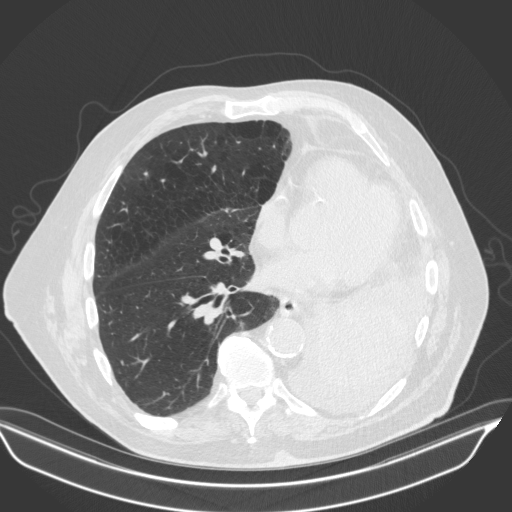
[im 78/176  lung]
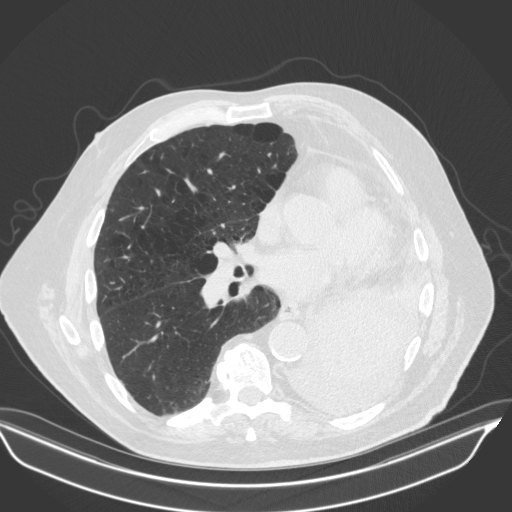
[im 91/176  lung]
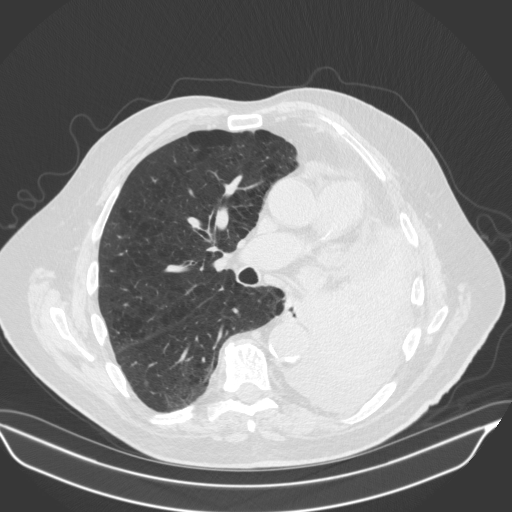
[im 98/176  lung]
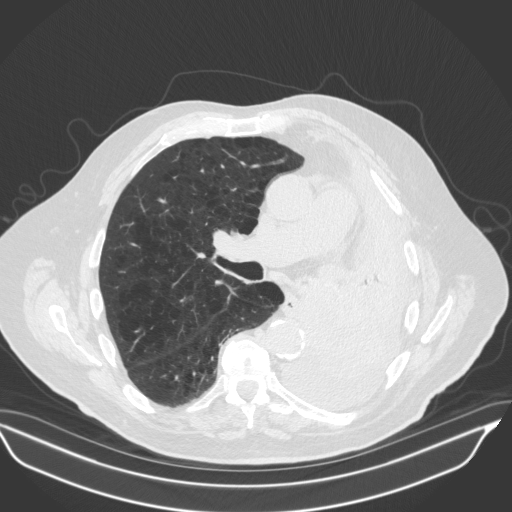
[im 106/176  mediastinal]
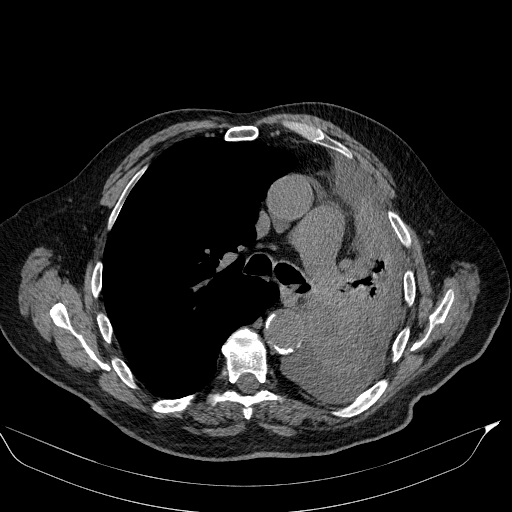
[im 106/176  lung]
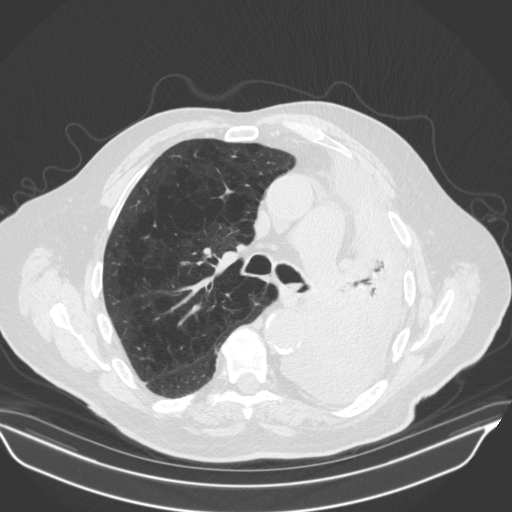
[im 124/176  lung]
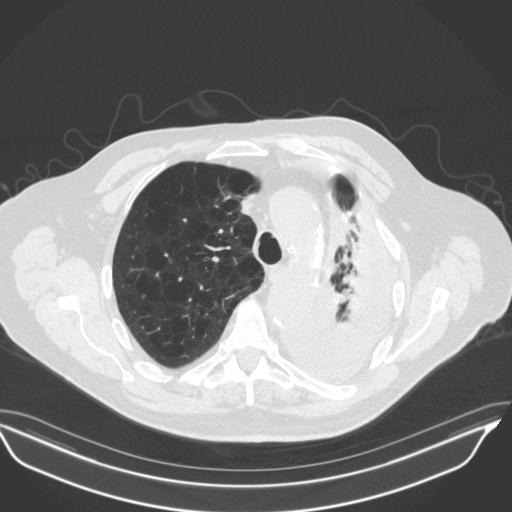
[im 137/176  lung]
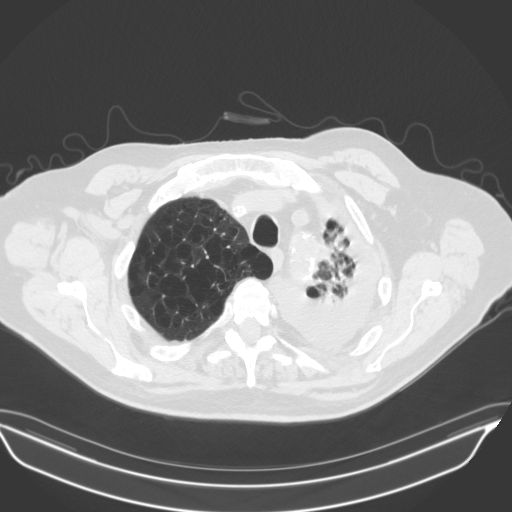
[im 150/176  lung]
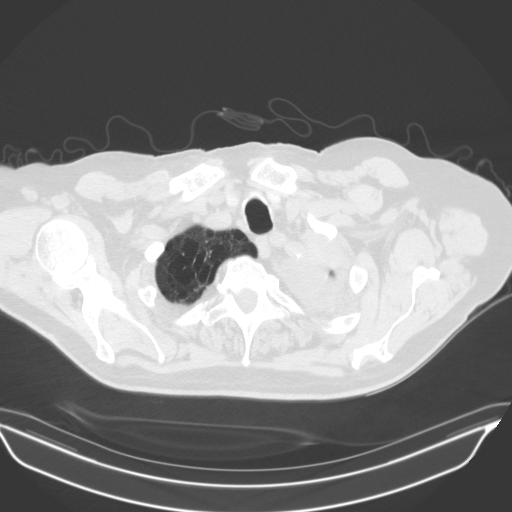
[im 163/176  mediastinal]
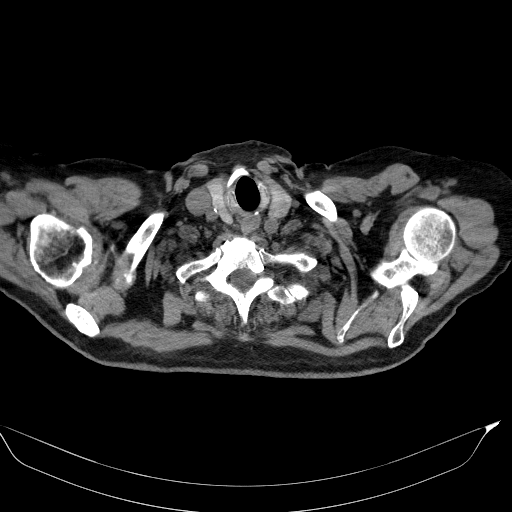
[im 163/176  lung]
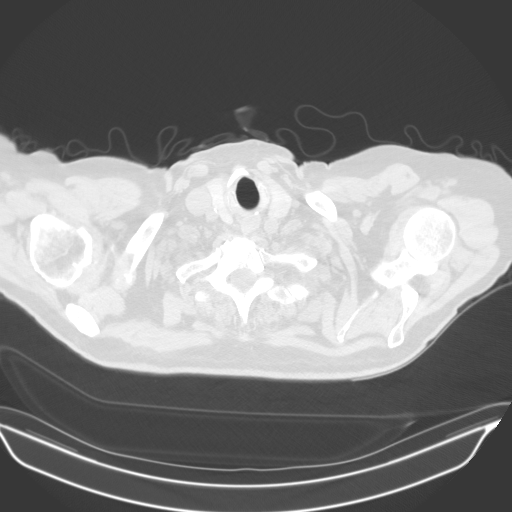

[13 of 34 positions shown; findings below may reference images not displayed]

FINDINGS: Cardiovascular: Heart size is normal. There is no significant
pericardial fluid, thickening or pericardial calcification. There is
aortic atherosclerosis, as well as atherosclerosis of the great
vessels of the mediastinum and the coronary arteries, including
calcified atherosclerotic plaque in the left main, left anterior
descending, left circumflex and right coronary arteries.

Mediastinum/Nodes: No pathologically enlarged mediastinal or hilar
lymph nodes. Please note that accurate exclusion of hilar adenopathy
is limited on noncontrast CT scans. Esophagus is unremarkable in
appearance. No axillary lymphadenopathy.

Lungs/Pleura: There appears to be a combination of collapse and
consolidation throughout the left lung. In the central aspect of the
left lower lobe (axial image 77 of series 2) there is a mass-like
area that is slightly lower attenuation the in the surrounding lung,
which is associated with abrupt cutoff of the left lower lobe
bronchus, concerning for potential neoplasm (although this could
simply represent an area of developing parenchymal necrosis).
Moderate left parapneumonic pleural effusion. Right lung is clear.
No right pleural effusion. In the posterior aspect of the right
lower lobe (axial image 122 of series 3) there is an elongated area
of architectural distortion measuring 17 x 5 mm which is less solid
in appearance than prior examination from [DATE], presumably an
area of scarring at the site of the previously noted
infectious/inflammatory nodule. 3 mm right lower lobe pulmonary
nodule (axial image 111 of series 3), stable, considered
definitively benign. No definite suspicious appearing pulmonary
nodules or masses are otherwise noted in the well aerated portions
of the lungs. Diffuse bronchial wall thickening with moderate to
severe centrilobular and paraseptal emphysema.

Upper Abdomen: Left kidney is incompletely imaged, but there appears
to be some left-sided hydronephrosis. Low-attenuation lesion
extending off the lateral aspect of the upper pole of the left
kidney, incompletely imaged and not characterized on today's
non-contrast CT examination, but statistically likely to represent a
cyst. Aortic atherosclerosis.

Musculoskeletal: There are no aggressive appearing lytic or blastic
lesions noted in the visualized portions of the skeleton.
IMPRESSION: 1. Possible obstructing central neoplasm in the left lower lobe with
postobstructive atelectasis and airspace consolidation throughout
the left lung and moderate parapneumonic left pleural effusion.
Given the central location of this suspected lesion, further
evaluation with bronchoscopy is suggested if clinically appropriate.
Alternatively, PET-CT could be considered.
2. Aortic atherosclerosis, in addition to left main and 3 vessel
coronary artery disease. Assessment for potential risk factor
modification, dietary therapy or pharmacologic therapy may be
warranted, if clinically indicated.
3. Possible left-sided hydronephrosis incompletely imaged. This
could be further evaluated with renal ultrasound.

These results will be called to the ordering clinician or
representative by the Radiologist Assistant, and communication
documented in the PACS or [REDACTED].

Aortic atherosclerosis.

## 2020-11-20 ENCOUNTER — Ambulatory Visit (HOSPITAL_COMMUNITY): Payer: Medicare Other

## 2020-11-22 ENCOUNTER — Ambulatory Visit (HOSPITAL_COMMUNITY): Payer: Medicare Other

## 2020-11-22 ENCOUNTER — Encounter: Payer: Self-pay | Admitting: Acute Care

## 2020-11-23 NOTE — Progress Notes (Signed)
Reviewed and agree with assessment/plan.   Chesley Mires, MD [facility] Pulmonary/Critical Care 11/23/2020, 7:13 AM Pager:  [phone]

## 2020-11-27 ENCOUNTER — Ambulatory Visit (HOSPITAL_COMMUNITY): Payer: Medicare Other

## 2020-11-28 ENCOUNTER — Encounter: Payer: Self-pay | Admitting: Gastroenterology

## 2020-11-29 ENCOUNTER — Ambulatory Visit (HOSPITAL_COMMUNITY): Payer: Medicare Other

## 2020-11-30 DIAGNOSIS — E119 Type 2 diabetes mellitus without complications: Secondary | ICD-10-CM | POA: Diagnosis not present

## 2020-11-30 DIAGNOSIS — J9611 Chronic respiratory failure with hypoxia: Secondary | ICD-10-CM | POA: Diagnosis not present

## 2020-11-30 DIAGNOSIS — M1990 Unspecified osteoarthritis, unspecified site: Secondary | ICD-10-CM | POA: Diagnosis not present

## 2020-11-30 DIAGNOSIS — G20 Parkinson's disease: Secondary | ICD-10-CM | POA: Diagnosis not present

## 2020-11-30 DIAGNOSIS — J432 Centrilobular emphysema: Secondary | ICD-10-CM | POA: Diagnosis not present

## 2020-11-30 DIAGNOSIS — I10 Essential (primary) hypertension: Secondary | ICD-10-CM | POA: Diagnosis not present

## 2020-12-05 DIAGNOSIS — G20 Parkinson's disease: Secondary | ICD-10-CM | POA: Diagnosis not present

## 2020-12-05 DIAGNOSIS — J432 Centrilobular emphysema: Secondary | ICD-10-CM | POA: Diagnosis not present

## 2020-12-05 DIAGNOSIS — E119 Type 2 diabetes mellitus without complications: Secondary | ICD-10-CM | POA: Diagnosis not present

## 2020-12-05 DIAGNOSIS — M1990 Unspecified osteoarthritis, unspecified site: Secondary | ICD-10-CM | POA: Diagnosis not present

## 2020-12-05 DIAGNOSIS — J9611 Chronic respiratory failure with hypoxia: Secondary | ICD-10-CM | POA: Diagnosis not present

## 2020-12-05 DIAGNOSIS — I10 Essential (primary) hypertension: Secondary | ICD-10-CM | POA: Diagnosis not present

## 2020-12-07 DIAGNOSIS — E119 Type 2 diabetes mellitus without complications: Secondary | ICD-10-CM | POA: Diagnosis not present

## 2020-12-07 DIAGNOSIS — I10 Essential (primary) hypertension: Secondary | ICD-10-CM | POA: Diagnosis not present

## 2020-12-07 DIAGNOSIS — E7801 Familial hypercholesterolemia: Secondary | ICD-10-CM | POA: Diagnosis not present

## 2020-12-07 DIAGNOSIS — Z125 Encounter for screening for malignant neoplasm of prostate: Secondary | ICD-10-CM | POA: Diagnosis not present

## 2020-12-10 ENCOUNTER — Telehealth: Payer: Self-pay | Admitting: Internal Medicine

## 2020-12-10 ENCOUNTER — Telehealth: Payer: Self-pay | Admitting: *Deleted

## 2020-12-10 MED ORDER — ISOSORBIDE MONONITRATE ER 30 MG PO TB24: 30.0000 mg | ORAL_TABLET | Freq: Every day | ORAL | 3 refills | Status: DC

## 2020-12-10 NOTE — Telephone Encounter (Signed)
*  STAT* If patient is at the pharmacy, call can be transferred to refill team.   1. Which medications need to be refilled? (please list name of each medication and dose if known) isosorbide mononitrate (IMDUR) 30 MG 24 hr tablet  2. Which pharmacy/location (including street and city if local pharmacy) is medication to be sent to? [facility] [place], [place] - [address]  3. Do they need a 30 day or 90 day supply? 90 day supply

## 2020-12-10 NOTE — Telephone Encounter (Signed)
HH plan of care to sign, in your inbox.

## 2020-12-11 DIAGNOSIS — I10 Essential (primary) hypertension: Secondary | ICD-10-CM | POA: Diagnosis not present

## 2020-12-11 DIAGNOSIS — E1140 Type 2 diabetes mellitus with diabetic neuropathy, unspecified: Secondary | ICD-10-CM | POA: Diagnosis not present

## 2020-12-11 DIAGNOSIS — G4720 Circadian rhythm sleep disorder, unspecified type: Secondary | ICD-10-CM | POA: Diagnosis not present

## 2020-12-11 DIAGNOSIS — I700 Atherosclerosis of aorta: Secondary | ICD-10-CM | POA: Diagnosis not present

## 2020-12-11 DIAGNOSIS — G20 Parkinson's disease: Secondary | ICD-10-CM | POA: Diagnosis not present

## 2020-12-11 DIAGNOSIS — Z9981 Dependence on supplemental oxygen: Secondary | ICD-10-CM | POA: Diagnosis not present

## 2020-12-11 DIAGNOSIS — Z0000 Encounter for general adult medical examination without abnormal findings: Secondary | ICD-10-CM | POA: Diagnosis not present

## 2020-12-11 DIAGNOSIS — J90 Pleural effusion, not elsewhere classified: Secondary | ICD-10-CM | POA: Diagnosis not present

## 2020-12-11 DIAGNOSIS — F331 Major depressive disorder, recurrent, moderate: Secondary | ICD-10-CM | POA: Diagnosis not present

## 2020-12-11 DIAGNOSIS — K439 Ventral hernia without obstruction or gangrene: Secondary | ICD-10-CM | POA: Diagnosis not present

## 2020-12-11 DIAGNOSIS — J439 Emphysema, unspecified: Secondary | ICD-10-CM | POA: Diagnosis not present

## 2020-12-11 DIAGNOSIS — R918 Other nonspecific abnormal finding of lung field: Secondary | ICD-10-CM | POA: Diagnosis not present

## 2020-12-11 NOTE — Telephone Encounter (Signed)
Signed forms ready.

## 2020-12-12 ENCOUNTER — Encounter: Payer: Self-pay | Admitting: Acute Care

## 2020-12-12 ENCOUNTER — Other Ambulatory Visit: Payer: Self-pay

## 2020-12-12 ENCOUNTER — Ambulatory Visit (INDEPENDENT_AMBULATORY_CARE_PROVIDER_SITE_OTHER): Payer: Medicare Other | Admitting: Acute Care

## 2020-12-12 VITALS — BP 140/76 | HR 90 | Temp 97.6°F | Ht 73.0 in | Wt 205.0 lb

## 2020-12-12 DIAGNOSIS — Z7189 Other specified counseling: Secondary | ICD-10-CM

## 2020-12-12 DIAGNOSIS — I240 Acute coronary thrombosis not resulting in myocardial infarction: Secondary | ICD-10-CM | POA: Diagnosis not present

## 2020-12-12 DIAGNOSIS — J90 Pleural effusion, not elsewhere classified: Secondary | ICD-10-CM | POA: Diagnosis not present

## 2020-12-12 DIAGNOSIS — C801 Malignant (primary) neoplasm, unspecified: Secondary | ICD-10-CM

## 2020-12-12 DIAGNOSIS — J441 Chronic obstructive pulmonary disease with (acute) exacerbation: Secondary | ICD-10-CM

## 2020-12-12 DIAGNOSIS — K831 Obstruction of bile duct: Secondary | ICD-10-CM

## 2020-12-12 DIAGNOSIS — J069 Acute upper respiratory infection, unspecified: Secondary | ICD-10-CM

## 2020-12-12 DIAGNOSIS — G20 Parkinson's disease: Secondary | ICD-10-CM

## 2020-12-12 DIAGNOSIS — R911 Solitary pulmonary nodule: Secondary | ICD-10-CM | POA: Diagnosis not present

## 2020-12-12 DIAGNOSIS — J9611 Chronic respiratory failure with hypoxia: Secondary | ICD-10-CM | POA: Diagnosis not present

## 2020-12-12 MED ORDER — PREDNISONE 10 MG PO TABS: ORAL_TABLET | ORAL | 0 refills | Status: DC

## 2020-12-12 MED ORDER — ZITHROMAX Z-PAK 250 MG PO TABS: ORAL_TABLET | ORAL | 0 refills | Status: DC

## 2020-12-12 NOTE — Patient Instructions (Addendum)
It is good to see you today. I will talk with Dr. Halford Chessman about the safety of having a stent placed for the cardiac blockages, and about best option for working up the obstruction in your lung. . You will incur significant increased risk of pulmonary complications with any procedures that would require putting you to sleep.  I will call you with the plan once I have spoken with Dr. Halford Chessman.  We will send in a z pack, take as directed We will send in a prednisone taper. Prednisone taper; 10 mg tablets: 4 tabs x 2 days, 3 tabs x 2 days, 2 tabs x 2 days 1 tab x 2 days then stop.  Watch your blood sugars carefully. Wear oxygen at 3 L at rest and 4L with exertion. Saturation goal is 88-92%, never lower. Follow up video visit in 1 week.  [phone] with Octavius Shin NP to make sure you are feeling better Please contact office for sooner follow up if symptoms do not improve or worsen or seek emergency care

## 2020-12-12 NOTE — Progress Notes (Signed)
History of Present Illness James [name] is a 75 y.o. 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 He is followed by Dr. Halford Chessman   12/12/2020 Pt. Presents for follow up. He has been followed for a recurrent effusion He had a follow up CT Chest 11/17/2020 which confirmed a possible obstructing atelectasis throughout the left lung. He states he has been doing about the same. Stable dyspnea with continued occasional blood streaked sputum. No fever. He is wearing 3 L [place] at rest , and 4 L with exertion. He looks better than when I saw him a month ago. Per his wife, the physical therapy helps a great deal. He is walking with a cane. His limitation is dyspnea.  We discussed that the pleural fluid from the  last thoracentesis he had done 10/25/2020 was + for atypical cells that stained + for atypical mesothelial cells.  We then discussed that the repeat CT Chest we did after 2 rounds of  antibiotics is still very concerning for an obstructive neoplasm , post obstructive atelectasis, as well as a moderate parapneumonic left pleural effusion. I discussed that the recommendation  by radiology was for bronchoscopy and tissue sampling or a PET scan to better evaluate the area. We also discussed possible management of the effusion. We discussed the increased risk of pulmonary and cardiac complications he carries with any procedure that involves putting him to sleep. He understands this .I spoke with him about how aggressive he would want [place] to be if further work up showed a cancer. He definitely does not want to be placed on a ventilator. However, he is interested in having a PET scan done to evaluate the central lesion . He is hesitant to be put to sleep because of the risk he incurs with his frail respiratory status.   He also has a  blocked LAD ,  which cardiology noted from his most recent  CT Chest.  Dr. [name], cardiology , wants to place a stent. She wants pulmonary to determine if this is safe for him to have done. According to the patient, she said he would not have to be put to sleep for this. (After a discussion, both the patient and his wife agreed to evaluate the blockage in his lung first, and then consider stent to his LAD.They would like to have a PET scan to do this.   We also discussed the need to determine if we need to drain the moderate L effusion. We discussed the role of a possible chest tube. We agreed that we will discuss this with Dr. Halford Chessman to see what he recommends.   His wife also states he sleeps during the day. Dr [name] was questioning if he could be started on Provigil to help with this.  Test Results:  CT Chest with contrast 11/17/2020 Possible obstructing central neoplasm in the left lower lobe with postobstructive atelectasis and airspace consolidation throughout the left lung and moderate parapneumonic left pleural effusion. Given the central location of this suspected lesion, further evaluation with bronchoscopy is suggested if clinically appropriate. Alternatively, PET-CT could be considered. 2. Aortic atherosclerosis, in addition to left main and 3 vessel coronary artery disease. Assessment for potential risk factor modification, dietary therapy or pharmacologic therapy may be warranted, if clinically indicated. 3. Possible left-sided hydronephrosis incompletely imaged. This could be further evaluated with renal  ultrasound.  10/25/2020 L thoracentesis pleural fluid cytology Atypical cells present  The atypical cells present are favored to be reactive mesothelial cells; The atypical cells are positive for calretinin and WT1 but negative for  MOC-31, supporting the interpretation of atypical mesothelial cells.   Pulmonary testing:  Spirometry 06/08/12 >> FEV1 1.91 (49%), FEV1% 59 Ambulatory oximetry  on room air 07/07/12 >> SpO2 86%  A1AT 07/07/12 >> 154, PI-MM PFT 05/02/20 >> FEV1 2.04 (58%), FEV1% 55, TLC 7.84 (102%), DLCO 41% Lt thoracentesis 09/17/20 >> glucose 117, protein 4.3, LDH 147, WBC 1490 (73% lymphs), cytology negative, culture negative Chest Imaging:    CT chest 03/23/13 >> mild/diffuse centrilobular emphysema, 4 mm nodule RLL superior segment, 4 mm nodule LLL CT chest 02/20/14 >> moderate centrilobular/paraseptal emphysema, mild diffuse bronchial wall thickening, 3 mm nodule RLL and 5 mm nodule LLL no change CT chest 02/13/15 >> new 1.5 cm nodule RLL. CT chest 05/28/15 >> increased RLL irregular nodule to 2 cm PET scan 06/14/15 >> 1.93 SUV RLL 11 mm lesion CT chest 11/13/15 >> no progression CT chest 05/28/16 >> no change in lung nodule CT chest 06/01/17 >> no change CT chest 05/10/20 >> mod/severe centrilobular emphysema, bronchial thickening, no change in 1.2 x 0.7 cm RLL nodule, no change 4 mm LLL nodule and 3 mm RLL nodule Sleep Tests:  PSG 01/25/08 >> RDI 6.8, SpO2 low 84%.  Spent 79% of test time with SpO2 < 90% ONO with RA 06/16/12 >> Test time 9 hrs 21 min.  Mean SpO2 89%, low SpO2 63%.  Spent 2 hrs 25 min with SpO2 < 88% PSG 07/13/19 >> AHI 3.9, SpO2 low 83%   Cardiac Tests:  Echo 06/06/20 >> EF 60 to 65%, grade 1 DD, aortic root 45 mm      CBC Latest Ref Rng & Units 10/22/2020 04/17/2020 10/14/2019  WBC 4.0 - 10.5 K/uL 13.0(H) 9.1 6.9  Hemoglobin 13.0 - 17.0 g/dL 8.7(L) 11.1(L) 13.1  Hematocrit 39.0 - 52.0 % 30.0(L) 37.4(L) 41.7  Platelets 150 - 400 K/uL 441(H) 308 278    BMP Latest Ref Rng & Units 03/18/2013 02/08/2008 11/24/2006  Glucose 70 - 99 mg/dL 130(H) 92 106(H)  BUN 6 - 23 mg/dL 14 8 10   Creatinine 0.4 - 1.5 mg/dL 1.1 0.92 0.92  Sodium 135 - 145 mEq/L 141 143 141  Potassium 3.5 - 5.1 mEq/L 3.9 4.1 4.8  Chloride 96 - 112 mEq/L 102 107 106  CO2 19 - 32 mEq/L 33(H) 31 29  Calcium 8.4 - 10.5 mg/dL 9.4 9.0 9.3    BNP No results found for: BNP  ProBNP No  results found for: PROBNP  PFT    Component Value Date/Time   FEV1PRE 1.84 05/02/2020 1253   FEV1POST 2.04 05/02/2020 1253   FVCPRE 3.51 05/02/2020 1253   FVCPOST 3.72 05/02/2020 1253   TLC 7.84 05/02/2020 1253   DLCOUNC 11.54 05/02/2020 1253   PREFEV1FVCRT 52 05/02/2020 1253   PSTFEV1FVCRT 55 05/02/2020 1253    CT Chest Wo Contrast  Result Date: 11/19/2020 CLINICAL DATA:  75 year old male with history of pulmonary nodules. COPD. EXAM: CT CHEST WITHOUT CONTRAST TECHNIQUE: Multidetector CT imaging of the chest was performed following the standard protocol without IV contrast. COMPARISON:  Chest CT 05/10/2020. FINDINGS: Cardiovascular: Heart size is normal. There is no significant pericardial fluid, thickening or pericardial calcification. There is aortic atherosclerosis, as well as atherosclerosis of the great vessels of the mediastinum and the coronary arteries, including calcified atherosclerotic plaque in  the left main, left anterior descending, left circumflex and right coronary arteries. Mediastinum/Nodes: No pathologically enlarged mediastinal or hilar lymph nodes. Please note that accurate exclusion of hilar adenopathy is limited on noncontrast CT scans. Esophagus is unremarkable in appearance. No axillary lymphadenopathy. Lungs/Pleura: There appears to be a combination of collapse and consolidation throughout the left lung. In the central aspect of the left lower lobe (axial image 77 of series 2) there is a mass-like area that is slightly lower attenuation the in the surrounding lung, which is associated with abrupt cutoff of the left lower lobe bronchus, concerning for potential neoplasm (although this could simply represent an area of developing parenchymal necrosis). Moderate left parapneumonic pleural effusion. Right lung is clear. No right pleural effusion. In the posterior aspect of the right lower lobe (axial image 122 of series 3) there is an elongated area of architectural distortion  measuring 17 x 5 mm which is less solid in appearance than prior examination from 05/10/2020, presumably an area of scarring at the site of the previously noted infectious/inflammatory nodule. 3 mm right lower lobe pulmonary nodule (axial image 111 of series 3), stable, considered definitively benign. No definite suspicious appearing pulmonary nodules or masses are otherwise noted in the well aerated portions of the lungs. Diffuse bronchial wall thickening with moderate to severe centrilobular and paraseptal emphysema. Upper Abdomen: Left kidney is incompletely imaged, but there appears to be some left-sided hydronephrosis. Low-attenuation lesion extending off the lateral aspect of the upper pole of the left kidney, incompletely imaged and not characterized on today's non-contrast CT examination, but statistically likely to represent a cyst. Aortic atherosclerosis. Musculoskeletal: There are no aggressive appearing lytic or blastic lesions noted in the visualized portions of the skeleton. IMPRESSION: 1. Possible obstructing central neoplasm in the left lower lobe with postobstructive atelectasis and airspace consolidation throughout the left lung and moderate parapneumonic left pleural effusion. Given the central location of this suspected lesion, further evaluation with bronchoscopy is suggested if clinically appropriate. Alternatively, PET-CT could be considered. 2. Aortic atherosclerosis, in addition to left main and 3 vessel coronary artery disease. Assessment for potential risk factor modification, dietary therapy or pharmacologic therapy may be warranted, if clinically indicated. 3. Possible left-sided hydronephrosis incompletely imaged. This could be further evaluated with renal ultrasound. These results will be called to the ordering clinician or representative by the Radiologist Assistant, and communication documented in the PACS or [employer]. Aortic atherosclerosis. Electronically Signed   By:  Vinnie Langton M.D.   On: 11/19/2020 12:21     Past medical hx Past Medical History:  Diagnosis Date   Allergy    seasonal   Blind left eye    legally   Blood transfusion    2002   COPD (chronic obstructive pulmonary disease) (HCC)    Depression    Diabetes mellitus, type 2 (HCC)    Diverticulosis    ED (erectile dysfunction)    Glaucoma    Hyperlipidemia    Hypertension    Insomnia    Internal and external bleeding hemorrhoids    Iron deficiency anemia    Osteoarthritis    Rosacea    Tremor of both hands    Tubular adenoma of colon 03/28/2011     Social History   Tobacco Use   Smoking status: Former    Packs/day: 1.50    Years: 50.00    Pack years: 75.00    Types: Cigarettes    Quit date: 04/30/2015    Years since quitting:  5.6   Smokeless tobacco: Never   Tobacco comments:    States he sometimes sneaks a cigarette  Vaping Use   Vaping Use: Never used  Substance Use Topics   Alcohol use: Yes    Alcohol/week: 0.0 standard drinks    Comment: once yearly   Drug use: No    Mr.Sak reports that he quit smoking about 5 years ago. His smoking use included cigarettes. He has a 75.00 pack-year smoking history. He has never used smokeless tobacco. He reports current alcohol use. He reports that he does not use drugs.  Tobacco Cessation: Quit 2017 with a 75 pack year smoking history.  Past surgical hx, Family hx, Social hx all reviewed.  Current Outpatient Medications on File Prior to Visit  Medication Sig   albuterol (VENTOLIN HFA) 108 (90 Base) MCG/ACT inhaler Inhale 1-2 puffs into the lungs every 6 (six) hours as needed for wheezing or shortness of breath.   atorvastatin (LIPITOR) 20 MG tablet Take 20 mg by mouth at bedtime.    budesonide (PULMICORT) 0.5 MG/2ML nebulizer solution Take 2 mLs (0.5 mg total) by nebulization 2 (two) times daily.   buPROPion (WELLBUTRIN XL) 300 MG 24 hr tablet Take 300 mg by mouth daily.   calcium-vitamin D (OSCAL WITH D) 250-125  MG-UNIT per tablet Take 1 tablet by mouth daily.   carbidopa-levodopa (SINEMET IR) 25-100 MG tablet Take 1.5 tablets by mouth 3 (three) times daily.   Carbidopa-Levodopa ER (SINEMET CR) 25-100 MG tablet controlled release Take 1 tablet by mouth 2 (two) times daily.   cetirizine (ZYRTEC) 10 MG tablet Take 10 mg by mouth daily.   donepezil (ARICEPT) 10 MG tablet Take 1 tablet (10 mg total) by mouth at bedtime.   ferrous gluconate (FERGON) 324 MG tablet TAKE 1 TABLET BY MOUTH DAILY WITH BREAKFAST   ferrous sulfate 325 (65 FE) MG tablet Take 325 mg by mouth 2 (two) times daily with a meal.    formoterol (PERFOROMIST) 20 MCG/2ML nebulizer solution Take 2 mLs (20 mcg total) by nebulization 2 (two) times daily.   Green Tea 315 MG CAPS Take 315 mg by mouth daily.   hydrochlorothiazide (HYDRODIURIL) 25 MG tablet Take 25 mg by mouth daily.   isosorbide mononitrate (IMDUR) 30 MG 24 hr tablet Take 1 tablet (30 mg total) by mouth daily.   levalbuterol (XOPENEX) 0.31 MG/3ML nebulizer solution Take 3 mLs (0.31 mg total) by nebulization every 4 (four) hours as needed for wheezing.   MEGARED OMEGA-3 KRILL OIL PO Take 1 capsule by mouth daily.   metFORMIN (GLUCOPHAGE) 500 MG tablet Take 1,000 mg by mouth 2 (two) times daily with a meal.    mirtazapine (REMERON) 15 MG tablet Take 15 mg by mouth at bedtime.   Multiple Vitamins-Minerals (MULTIVITAMIN WITH MINERALS) tablet Take 1 tablet by mouth daily.   nitroGLYCERIN (NITROSTAT) 0.4 MG SL tablet Place 1 tablet (0.4 mg total) under the tongue every 5 (five) minutes as needed for chest pain.   omeprazole (PRILOSEC OTC) 20 MG tablet Take 20 mg by mouth daily with breakfast.   Oyster Shell Calcium 500 MG TABS    revefenacin (YUPELRI) 175 MCG/3ML nebulizer solution Take 3 mLs (175 mcg total) by nebulization daily.   sertraline (ZOLOFT) 100 MG tablet Take 150 mg by mouth daily.   No current facility-administered medications on file prior to visit.     Allergies   Allergen Reactions   Aspirin Other (See Comments)   Codeine Itching   Morphine Itching  Review Of Systems:  Constitutional:   No  weight loss, night sweats,  Fevers, chills, +fatigue, or  lassitude.  HEENT:   No headaches,  Difficulty swallowing,  Tooth/dental problems, or  Sore throat,                No sneezing, itching, ear ache, nasal congestion, post nasal drip,   CV:  No chest pain,  Orthopnea, PND, swelling in lower extremities, anasarca, dizziness, palpitations, syncope.   GI  No heartburn, indigestion, abdominal pain, nausea, vomiting, diarrhea, change in bowel habits, loss of appetite, bloody stools.   Resp: + shortness of breath with exertion less at rest.  No excess mucus, + productive cough,  No non-productive cough,  + occasional  coughing up of blood.  No change in color of mucus.  + baseline  wheezing.  No chest wall deformity  Skin: no rash or lesions.  GU: no dysuria, change in color of urine, no urgency or frequency.  No flank pain, no hematuria   MS:  No joint pain or swelling.  + decreased range of motion.  No back pain.  Psych:  No change in mood or affect. + depression or anxiety.  No memory loss.   Vital Signs BP 140/76 (BP Location: Right Arm, Patient Position: Sitting, Cuff Size: Normal)   Pulse 90   Temp 97.6 F (36.4 C) (Oral)   Ht 6\' 1"  (1.854 m)   Wt 205 lb (93 kg)   SpO2 90%   BMI 27.05 kg/m    Physical Exam:  General- No distress,  A&Ox 3, appropriate ENT: No sinus tenderness, TM clear, pale nasal mucosa, no oral exudate,no post nasal drip, no LAN Cardiac: S1, S2, regular rate and rhythm, no murmur Chest: + wheeze/ rales/ dullness; no accessory muscle use, no nasal flaring, no sternal retractions, diminished per bases, L>R Abd.: Soft Non-tender, ND, BS +, Body mass index is 27.05 kg/m. Ext: No clubbing cyanosis, edema Neuro:  Physically deconditioned, MAE x4, A&O x 3 Skin: No rashes, warm and dry Psych: Good mood but per his wife  he does have some depression at times.    Assessment/Plan Left Pleural Effusion Thoracentesis x 2>> Exudative Repeat CT Chest 9/24 shows moderate parapneumonic left pleural effusion. Plan Consider Chest Tube to drain  ? Any role for a Pleurex catheter Will discuss with Dr. Halford Chessman  Chronic respiratory failure  2/2 COPD and recurrent effusion New cough, increased wheezing/ URI Plan Wear oxygen at 3 L at rest and 4L with exertion. Saturation goal is 88-92%, never lower. Z pack Prednisone taper; 10 mg tablets: 4 tabs x 2 days, 3 tabs x 2 days, 2 tabs x 2 days 1 tab x 2 days then stop.  Watch blood sugars while on prednisone Follow up video visit in 1 week.  [phone] with Shequita Peplinski NP to make sure you are feeling better  COPD with Emphysema Continue Yupelri and Performist  and Pulmicort nebs as you have been doing.  Prn Xopenex as rescue Wear oxygen as above  Possible obstructing central neoplasm in the left lower lobe with postobstructive atelectasis and airspace consolidation throughout the left lung High risk for bronchoscopy or any other procedure requiring patient be put to sleep Plan Pt. Is in agreement with PET scan to better evaluate area Poor candidate for Bronch Consider blood testing if PET is hypermetabolic  Goals of Care Plan Patient does not want to be intubated or placed on a ventilator He does want PET to better evaluate  centrally obstructive mass Based on results of PET he would determine how aggressive he would want to be regarding treatment  LAD occlusion, unsure percentage Per cards stent may improve his breathing, would not require being put to sleep per patient Plan Follow up after evaluation of lung mass  Parkinson's Disease Follow up with Dr.Saima Athar with [facility]    I spent 50 minutes dedicated to the care of this patient on the date of this encounter to include pre-visit review of records, face-to-face time with the patient discussing  conditions above, post visit ordering of testing, clinical documentation with the electronic health record, making appropriate referrals as documented, and communicating necessary information to the patient's healthcare team.     Magdalen Spatz, NP 12/12/2020  9:04 PM

## 2020-12-13 DIAGNOSIS — I10 Essential (primary) hypertension: Secondary | ICD-10-CM | POA: Diagnosis not present

## 2020-12-13 DIAGNOSIS — J432 Centrilobular emphysema: Secondary | ICD-10-CM | POA: Diagnosis not present

## 2020-12-13 DIAGNOSIS — J9611 Chronic respiratory failure with hypoxia: Secondary | ICD-10-CM | POA: Diagnosis not present

## 2020-12-13 DIAGNOSIS — M1990 Unspecified osteoarthritis, unspecified site: Secondary | ICD-10-CM | POA: Diagnosis not present

## 2020-12-13 DIAGNOSIS — G20 Parkinson's disease: Secondary | ICD-10-CM | POA: Diagnosis not present

## 2020-12-13 DIAGNOSIS — E119 Type 2 diabetes mellitus without complications: Secondary | ICD-10-CM | POA: Diagnosis not present

## 2020-12-13 NOTE — Telephone Encounter (Signed)
LVM: [facility] called, Pt discharged from [facility] was request of the patient. He got a lot of other health issues going on right now. Know that they're going to try to figure out what's going on with his lungs. They have said that they think its cancer, trying to figure out a bunch of stuff. He's starting to become depressed, and he said he want to take a break from therapy for now. His wife in agreement with him. We just hold off all therapy right now. Have let them know the process in order to get [place] back reinstated if they wanted.

## 2020-12-13 NOTE — Telephone Encounter (Signed)
noted 

## 2020-12-14 ENCOUNTER — Telehealth: Payer: Self-pay | Admitting: Acute Care

## 2020-12-14 NOTE — Telephone Encounter (Signed)
Pt has been scheduled for 11/2 and I called and gave appt info to pt's wife.  Will route back to triage as requested.

## 2020-12-14 NOTE — Telephone Encounter (Signed)
Spoke to patient's spouse, Lucy(DPR) and relayed below message/recommendations. She voiced her understanding and had no further questions.  PCC's please let triage know when PET is scheduled. Thanks

## 2020-12-14 NOTE — Addendum Note (Signed)
Addended by: Eric Form F on: 12/14/2020 09:07 AM   Modules accepted: Orders

## 2020-12-14 NOTE — Telephone Encounter (Signed)
Please call patient and wife and let them know I have spoken with Dr. Halford Chessman and that we are ordering a PET scan to better evaluate the area in his lung . Based on results we can determine next steps.  He will need an appointment with Either me or Dr. Halford Chessman after the PET if you can watch for when the PET is scheduled and get an OV or video visit scheduled for that time. Thanks so much.

## 2020-12-14 NOTE — Telephone Encounter (Signed)
Called and spoke with pt's spouse [name]  stating to her that we needed to reschedule pt's appt after PET and she verbalized understanding. Appt has been rescheduled for pt 11/7. Stated to [name] that we would keep the 1 week follow up mychart visit as scheduled and she verbalized understanding. Nothing further needed.

## 2020-12-18 ENCOUNTER — Telehealth: Payer: Self-pay | Admitting: Internal Medicine

## 2020-12-18 NOTE — Telephone Encounter (Signed)
*  STAT* If patient is at the pharmacy, call can be transferred to refill team.     1. Which medications need to be refilled? (please list name of each medication and dose if known) isosorbide mononitrate (IMDUR) 30 MG 24 hr tablet   2. Which pharmacy/location (including street and city if local pharmacy) is medication to be sent to? [facility] [place], [place] - [address]   3. Do they need a 30 day or 90 day supply? 90 day supply

## 2020-12-19 ENCOUNTER — Telehealth: Payer: Medicare Other | Admitting: Acute Care

## 2020-12-19 ENCOUNTER — Other Ambulatory Visit: Payer: Self-pay | Admitting: *Deleted

## 2020-12-19 ENCOUNTER — Telehealth (INDEPENDENT_AMBULATORY_CARE_PROVIDER_SITE_OTHER): Payer: Self-pay | Admitting: Acute Care

## 2020-12-19 ENCOUNTER — Telehealth (INDEPENDENT_AMBULATORY_CARE_PROVIDER_SITE_OTHER): Payer: Medicare Other | Admitting: Acute Care

## 2020-12-19 ENCOUNTER — Encounter: Payer: Self-pay | Admitting: Acute Care

## 2020-12-19 DIAGNOSIS — J439 Emphysema, unspecified: Secondary | ICD-10-CM

## 2020-12-19 DIAGNOSIS — I240 Acute coronary thrombosis not resulting in myocardial infarction: Secondary | ICD-10-CM

## 2020-12-19 DIAGNOSIS — Z7189 Other specified counseling: Secondary | ICD-10-CM

## 2020-12-19 DIAGNOSIS — J9611 Chronic respiratory failure with hypoxia: Secondary | ICD-10-CM

## 2020-12-19 DIAGNOSIS — R400 Somnolence: Secondary | ICD-10-CM | POA: Diagnosis not present

## 2020-12-19 DIAGNOSIS — G20A1 Parkinson's disease without dyskinesia, without mention of fluctuations: Secondary | ICD-10-CM

## 2020-12-19 DIAGNOSIS — J441 Chronic obstructive pulmonary disease with (acute) exacerbation: Secondary | ICD-10-CM | POA: Diagnosis not present

## 2020-12-19 DIAGNOSIS — G20 Parkinson's disease: Secondary | ICD-10-CM

## 2020-12-19 DIAGNOSIS — R042 Hemoptysis: Secondary | ICD-10-CM

## 2020-12-19 MED ORDER — HYDROCODONE BIT-HOMATROP MBR 5-1.5 MG/5ML PO SOLN: 5.0000 mL | Freq: Four times a day (QID) | ORAL | 0 refills | Status: DC | PRN

## 2020-12-19 MED ORDER — ISOSORBIDE MONONITRATE ER 30 MG PO TB24: 30.0000 mg | ORAL_TABLET | Freq: Every day | ORAL | 3 refills | Status: DC

## 2020-12-19 MED ORDER — PROVIGIL 100 MG PO TABS: 100.0000 mg | ORAL_TABLET | Freq: Every day | ORAL | 1 refills | Status: DC

## 2020-12-19 NOTE — Telephone Encounter (Signed)
Refills has been sent to pharmacy.

## 2020-12-19 NOTE — Telephone Encounter (Signed)
Noted, thank you

## 2020-12-19 NOTE — Progress Notes (Addendum)
Virtual Visit via Video Note  I connected with James [name] on 12/19/20 at  3:00 PM EDT by a video enabled telemedicine application and verified that I am speaking with the correct person using two identifiers.  Location: Patient: At home Provider: [facility], [place], [place], [address]    I discussed the limitations of evaluation and management by telemedicine and the availability of in person appointments. The patient expressed understanding and agreed to proceed.  History of Present Illness: Pt. Presents for follow up. He was still symptomatic last week when I saw him 10/19 for dyspnea and a cough, secretions that were discolored. He has been retreated with z pack and prednisone taper. He states he has completed both the antibiotic and the pred taper, and is no better after treatment with antibiotic and prednisone. He is still very short of breath. Oxygen levels drop into the 70's when he walks. His concentrator does not go up to 10 L. We will order increased oxygen flow through DME. He was seen by his PCP. His HGB dropped to 9, which may be contributing. He is getting iron gluconate once daily. He will have repeat CBC on 12/27/2020. Secretions are sometimes bloody, and sometimes clear. The bright red blood has been going on for about 4 months. There is no increase in amount, it is about the same.  He is wheezing , and the prednisone did not help at all. He is coughing frequently.  They are interested in starting Provigil. We will sent in prescription for 100 mg daily.  We have  ordered the  PET scan and based on results determine next steps to better evaluate the centrally obstructing mass of concern, and best way to treat his effusion.      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 Given the central location of this suspected lesion, further evaluation with bronchoscopy is suggested if clinically appropriate. Alternatively, PET-CT could be considered. 2. Aortic atherosclerosis, in addition to left main and 3 vessel coronary artery disease. Assessment for potential risk factor modification, dietary therapy or pharmacologic therapy may be warranted, if clinically indicated. 3. Possible left-sided hydronephrosis incompletely imaged. This could be further evaluated with renal ultrasound.   10/25/2020 L thoracentesis pleural fluid cytology Atypical cells present  The atypical cells present are favored to be reactive mesothelial cells; The atypical cells are positive for calretinin and WT1 but negative for  MOC-31, supporting the interpretation of atypical mesothelial cells.    Pulmonary testing:  Spirometry 06/08/12 >> FEV1 1.91 (49%), FEV1% 59 Ambulatory oximetry on room air 07/07/12 >> SpO2 86%  A1AT 07/07/12 >> 154, PI-MM PFT 05/02/20 >> FEV1 2.04 (58%), FEV1% 55, TLC 7.84 (102%), DLCO 41% Lt thoracentesis 09/17/20 >> glucose 117, protein 4.3, LDH 147, WBC 1490 (73% lymphs), cytology negative, culture negative Chest Imaging:    CT chest 03/23/13 >> mild/diffuse centrilobular emphysema, 4 mm nodule RLL superior segment, 4 mm nodule LLL CT chest 02/20/14 >> moderate centrilobular/paraseptal emphysema, mild diffuse bronchial wall thickening, 3 mm nodule RLL and 5 mm nodule LLL no change CT chest 02/13/15 >> new 1.5 cm nodule RLL. CT chest 05/28/15 >> increased RLL irregular nodule to 2 cm PET scan 06/14/15 >> 1.93 SUV RLL 11 mm lesion CT chest 11/13/15 >> no progression CT chest 05/28/16 >> no change in lung nodule CT chest 06/01/17 >>  no change CT chest 05/10/20 >> mod/severe centrilobular emphysema, bronchial thickening, no change in 1.2 x 0.7 cm RLL nodule, no change 4 mm LLL nodule and 3 mm RLL nodule Sleep Tests:  PSG 01/25/08 >> RDI 6.8, SpO2 low 84%.   Spent 79% of test time with SpO2 < 90% ONO with RA 06/16/12 >> Test time 9 hrs 21 min.  Mean SpO2 89%, low SpO2 63%.  Spent 2 hrs 25 min with SpO2 < 88% PSG 07/13/19 >> AHI 3.9, SpO2 low 83%   Cardiac Tests:  Echo 06/06/20 >> EF 60 to 65%, grade 1 DD, aortic root 45 mm       Assessment and Plan: Assessment/Plan Left Pleural Effusion Thoracentesis x 2>> Exudative Repeat CT Chest 9/24 shows moderate parapneumonic left pleural effusion. Plan  We will order a PET scan , if patient has worsening respiratory issues, we will obviously address on a more urgent basis. Currently clinically stable   Chronic respiratory failure  2/2 COPD and recurrent effusion New cough, increased wheezing/ URI Plan Wear oxygen at 3 L at rest and 4L with exertion. Saturation goal is 88-92%, never lower.   COPD with Emphysema Treated with additional abx and prednisone Plan Continue Yupelri and Performist  and Pulmicort nebs as you have been doing.  Prn Xopenex as rescue Wear oxygen as above  Hemoptysis with Cough Per wife he has taken codeine before, with itching only, no airway swelling or hives. Both patient and wife are willing to try Hydromet for his cough with hemoptysis.     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 Is in agreement with PET scan to better evaluate area Poor candidate for Bronch Consider blood testing if PET is hypermetabolic   Goals of Care Plan Patient does not want to be intubated or placed on a ventilator He does want PET to better evaluate centrally obstructive mass Based on results of PET he would determine how aggressive he would want to be regarding treatment   LAD occlusion, unsure percentage Per cards stent may improve his breathing, would not require being put to sleep per  patient Plan May need for this to be done prior to any procedures to better evaluate after lung mass based on PET results Follow up after after PET scanning  Daytime somnolence Plan Will start Provigil 100 mg daily in the morning. Follow up video visit  with Dr. Halford Chessman or [name] NP after PET scan has been done to see if this is beneficial,  and to determine plan of care moving forward. ( Should be no longer than 3 week follow up )      Parkinson's Disease Follow up with Karmen Bongo with [facility]    Follow Up Instructions: Follow up after PET scan to review results and determine plan of care.    I discussed the assessment and treatment plan with the patient. The patient was provided an opportunity to ask questions and all were answered. The patient agreed with the plan and demonstrated an understanding of the instructions.   The patient was advised to call back or seek an in-person evaluation if the symptoms worsen or if the condition fails to improve as anticipated.  I spent 45 minutes dedicated to the care of this patient on the date of this encounter to include pre-visit review of records, face-to-face  time with the patient discussing conditions above, post visit ordering of testing, clinical documentation with the electronic health record, making appropriate referrals as documented, and communicating necessary information to the patient's healthcare team.    Magdalen Spatz, NP  12/19/2020

## 2020-12-19 NOTE — Patient Instructions (Addendum)
It is good to see you. We will order a higher flow oxygen for your home tanks. We will request tanks that have 10 L capacity. Oxygen saturation goals are 90-92% Call 911 for chest pain that does not go away.  With Chest pain, sit down, turn oxygen up to 5 L until it has resolved.  PET scan 12/26/2020 as is scheduled.  CBC per Dr. [name] 12/27/2020 If CBC does not rebound on iron, consider 1 unit PRBC's.  Follow up after PET with Dr. Halford Chessman  to determine plan of care.  Hydromet cough syrup 5 cc's as needed up to every 6 hours, but titrate this based on sleepiness.  Do not drive if sleepy. If you get worse not better, seek emergency care. Continue Yupelri and Performist  and Pulmicort nebs as you have been doing.  Prn Xopenex as rescue Please contact office for sooner follow up if symptoms do not improve or worsen or seek emergency care

## 2020-12-19 NOTE — Telephone Encounter (Signed)
PET is scheduled for 12/26/2020. Can we get him in to see Dr. Halford Chessman or me the very next week to go over the PET results?  Thanks so much for your help!!

## 2020-12-20 ENCOUNTER — Telehealth: Payer: Self-pay | Admitting: Pharmacy Technician

## 2020-12-20 ENCOUNTER — Other Ambulatory Visit (HOSPITAL_COMMUNITY): Payer: Self-pay

## 2020-12-20 ENCOUNTER — Telehealth: Payer: Self-pay | Admitting: Acute Care

## 2020-12-20 ENCOUNTER — Encounter: Payer: Self-pay | Admitting: Hematology and Oncology

## 2020-12-20 DIAGNOSIS — J439 Emphysema, unspecified: Secondary | ICD-10-CM

## 2020-12-20 NOTE — Telephone Encounter (Signed)
Received a fax regarding Prior Authorization from [place] for . Authorization has been DENIED because PT DOES NOT MEET FDA APPROVED USES. PLEASE SEE DENIAL LETTER FOR FURTHER INFORMATION AND/OR APPEAL PROCESS.

## 2020-12-20 NOTE — Progress Notes (Signed)
Reviewed and agree with assessment/plan.   Chesley Mires, MD [facility] Pulmonary/Critical Care 12/20/2020, 8:53 AM Pager:  [phone]

## 2020-12-20 NOTE — Telephone Encounter (Signed)
Dr Halford Chessman, please advise if you wish to appeal the denial for the modafinil 100 mg

## 2020-12-20 NOTE — Telephone Encounter (Signed)
Patient Advocate Encounter  Received notification from [place] that prior authorization for MODAFINIL 100MG  is required.   PA submitted on 10.27.22 Key [id number] Status is pending   [facility] will continue to follow  Luciano Cutter, CPhT Patient Advocate Phone: [phone] Fax:  [phone]

## 2020-12-21 NOTE — Telephone Encounter (Signed)
Order placed to [place].  Nothing further needed at this time.

## 2020-12-21 NOTE — Telephone Encounter (Signed)
Spoke to [place] with [facility]. Kenney Houseman is requesting an updated order that includes liter flow. Order placed yesterday for 10L concentrator.  I see mentioning in last note to increase 5L with chest pain. What is patient's baseline liter flow?  Sarah, please advise. thanks

## 2020-12-21 NOTE — Telephone Encounter (Signed)
Routing back to prior auth team.

## 2020-12-21 NOTE — Telephone Encounter (Signed)
Sarah placed the order for provigil.  I am not sure which diagnosis was used for this.  Off label indications that could apply for Mr. Marlatt include acute depression, cancer related fatigue, and Parkinson's disease excess daytime sleepiness.    Will include Sarah in communication.  Please start appeal process.

## 2020-12-24 DIAGNOSIS — F339 Major depressive disorder, recurrent, unspecified: Secondary | ICD-10-CM | POA: Diagnosis not present

## 2020-12-24 DIAGNOSIS — E119 Type 2 diabetes mellitus without complications: Secondary | ICD-10-CM | POA: Diagnosis not present

## 2020-12-24 DIAGNOSIS — G4700 Insomnia, unspecified: Secondary | ICD-10-CM | POA: Diagnosis not present

## 2020-12-24 DIAGNOSIS — I10 Essential (primary) hypertension: Secondary | ICD-10-CM | POA: Diagnosis not present

## 2020-12-25 ENCOUNTER — Other Ambulatory Visit (HOSPITAL_COMMUNITY): Payer: Self-pay

## 2020-12-25 NOTE — Telephone Encounter (Signed)
James [name], CPhT  You; [facility] Triage Pool; Beatriz Chancellor, CPhT 1 hour ago (2:33 PM)   Please see page 4 of the denial letter. It is under Media listed as AMB correspondence prior authorization. There is a box with very specific criteria that the pt doesn't meet, therefore our team will not be able to do the appeal. If you still feel the pt meets criteria, you may call or fax appeal based on the instructions listed in the letter. Thanks.      Routing this to Sarah and Dr. Halford Chessman as well. This was routed to [employer].

## 2020-12-26 ENCOUNTER — Encounter (HOSPITAL_COMMUNITY)
Admission: RE | Admit: 2020-12-26 | Discharge: 2020-12-26 | Disposition: A | Discharge status: Home / Self Care | Payer: Medicare Other | Source: Ambulatory Visit | Attending: Acute Care | Admitting: Acute Care

## 2020-12-26 ENCOUNTER — Telehealth: Payer: Self-pay | Admitting: Acute Care

## 2020-12-26 ENCOUNTER — Other Ambulatory Visit: Payer: Self-pay

## 2020-12-26 DIAGNOSIS — R911 Solitary pulmonary nodule: Secondary | ICD-10-CM | POA: Diagnosis not present

## 2020-12-26 DIAGNOSIS — J90 Pleural effusion, not elsewhere classified: Secondary | ICD-10-CM | POA: Diagnosis not present

## 2020-12-26 DIAGNOSIS — J9819 Other pulmonary collapse: Secondary | ICD-10-CM | POA: Diagnosis not present

## 2020-12-26 DIAGNOSIS — I2510 Atherosclerotic heart disease of native coronary artery without angina pectoris: Secondary | ICD-10-CM | POA: Diagnosis not present

## 2020-12-26 DIAGNOSIS — J432 Centrilobular emphysema: Secondary | ICD-10-CM | POA: Diagnosis not present

## 2020-12-26 LAB — GLUCOSE, CAPILLARY: Glucose-Capillary: 107 mg/dL — ABNORMAL HIGH (ref 70–99)

## 2020-12-26 IMAGING — CT NM PET TUM IMG RESTAG (PS) SKULL BASE T - THIGH
1 of 8 series · 1 of 25 positions shown · non-contrast
Comparison: Chest CT [DATE]

CLINICAL DATA: Initial treatment strategy for pulmonary nodule.

EXAM:
NUCLEAR MEDICINE PET SKULL BASE TO THIGH
TECHNIQUE: 10.2 mCi F-18 FDG was injected intravenously. Full-ring PET imaging
was performed from the skull base to thigh after the radiotracer. CT
data was obtained and used for attenuation correction and anatomic
localization.
Fasting blood glucose: 107 mg/dl

[Series 2: ac ct sk_thigh 5.0 hd_fov · axial · 5.0mm · 1.52mm/px · 1 of 247 slices shown]
[im 124/247  brain]
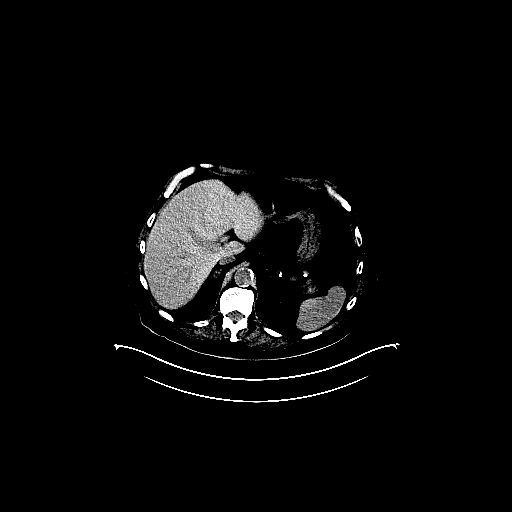

[1 of 25 positions shown; findings below may reference images not displayed]

FINDINGS: Mediastinal blood pool activity: SUV max

Liver activity: SUV max NA

NECK: Small focus of FDG accumulation identified medial to the left
mandible in the region of a tiny calcification, potentially
sialolith.

Incidental CT findings: none

CHEST: The central left lower lobe mass is markedly hypermetabolic
with SUV max = 17.5 and obvious central necrosis. Hypermetabolism
extends into the left hilum. CT imaging today shows extensive debris
in the left mainstem bronchus and airways to the lingula and left
lower lobe. Left lower lobe collapse posteriorly shows low level
hypermetabolism.

No evidence for hypermetabolic mediastinal or right hilar
lymphadenopathy. No hypermetabolic lymphadenopathy in the axillary
regions or supraclavicular spaces.

Incidental CT findings: Moderate atherosclerotic calcification is
noted in the wall of the thoracic aorta. Coronary artery
calcification is evident. Centrilobular and paraseptal emphysema
noted bilaterally. Small left pleural effusion.

ABDOMEN/PELVIS: No abnormal hypermetabolic activity within the
liver, pancreas, adrenal glands, or spleen. No hypermetabolic lymph
nodes in the abdomen or pelvis.

There is some focal hypermetabolism in the left pelvis in the region
of the distal left ureter. Although there is no left
hydroureteronephrosis, an 8 mm calcification projects in the
expected location of the distal left ureter suggesting distal left
ureteral stone. FDG accumulation in this region could be related to
static urine or inflammation.

Subcutaneous nodule left paramidline lower anterior abdominal wall
without hypermetabolism, likely a sebaceous cyst.

Incidental CT findings: Bilateral renal cysts noted. Tiny
nonobstructing stone identified in the lower pole left kidney.

SKELETON: Small focus of hypermetabolism identified anterior to the
right femoral neck with SUV max = 5.1. No underlying bony or soft
tissue lesion at this location.

Incidental CT findings: Degenerative changes noted both hips.No
worrisome lytic or sclerotic osseous abnormality.
IMPRESSION: 1. Central left lower lobe pulmonary mass shows central necrosis
with marked peripheral hypermetabolism, consistent with primary
neoplasm. FDG accumulation in collapse lung peripheral to the
central lesion is indeterminate and may be infectious/inflammatory
although tumor spread not excluded.
2. No hypermetabolic lymphadenopathy in the mediastinum or right
hilum. No evidence for distant hypermetabolic disease in the
abdomen/pelvis.
3. Small focus of hypermetabolism anterior to the right femoral neck
without underlying bony or soft tissue lesion. Continued close
attention on follow-up recommended.
4. Possible 8 mm distal left ureteral stone without left
hydroureteronephrosis.
5. Tiny focus of hypermetabolism medial to the left mandible in the
region of a tiny calcification. Imaging features may be related to a
sialolith.
6.  Aortic Atherosclerois ([PV]-170.0)
7.  Emphysema. ([PV]-[PV])

## 2020-12-26 MED ORDER — FLUDEOXYGLUCOSE F - 18 (FDG) INJECTION
10.2500 | Freq: Once | INTRAVENOUS | Status: AC | PRN
  Administered 2020-12-26: 10.23 via INTRAVENOUS

## 2020-12-26 NOTE — Telephone Encounter (Signed)
Patient's wife called back. She stated that he is still taking Wellbutrin XL 300mg  as well as Zoloft 150mg .   Will route to the PA team so they can have this information for the appeal.

## 2020-12-26 NOTE — Telephone Encounter (Signed)
See pharmacy note from 12/19/20. Will close encounter.

## 2020-12-26 NOTE — Telephone Encounter (Signed)
LMTCB   No phone note/message noted in chart.

## 2020-12-26 NOTE — Telephone Encounter (Signed)
Paperwork printed off and given to [name].

## 2020-12-26 NOTE — Telephone Encounter (Signed)
Pt has wellbutrin listed on current medication list. Per [name], please check with pt's spouse to see if pt is still taking this med as if pt is, we can add this to the appeal to try to get the med approved for pt.  Attempted to call pt's spouse [name] but unable to reach. Left message for her to return call.

## 2020-12-27 DIAGNOSIS — E119 Type 2 diabetes mellitus without complications: Secondary | ICD-10-CM | POA: Diagnosis not present

## 2020-12-27 DIAGNOSIS — D649 Anemia, unspecified: Secondary | ICD-10-CM | POA: Diagnosis not present

## 2020-12-27 DIAGNOSIS — Z125 Encounter for screening for malignant neoplasm of prostate: Secondary | ICD-10-CM | POA: Diagnosis not present

## 2020-12-27 DIAGNOSIS — I10 Essential (primary) hypertension: Secondary | ICD-10-CM | POA: Diagnosis not present

## 2020-12-27 DIAGNOSIS — E7801 Familial hypercholesterolemia: Secondary | ICD-10-CM | POA: Diagnosis not present

## 2020-12-27 NOTE — Telephone Encounter (Signed)
James [name], Your first availability is 11/28 and Dr. Juanetta Gosling first availability is 11/29.  Please advise.  Thank you.

## 2020-12-27 NOTE — Progress Notes (Signed)
Dr. Halford Chessman will discuss results with patient on 12/31/2020 when he sees him in the clinic.

## 2020-12-27 NOTE — Telephone Encounter (Signed)
PA Team will need assistance drafting appeal letter. PA team does not currently have a pharmacist to assist with clinically complicated appeal's. Can you please provide detailed appeal reasoning, we can copy and into an appeal letter to submit on the prescriber's behalf. Thank you!

## 2020-12-28 NOTE — Telephone Encounter (Signed)
Appeal for Provigil has been sent via fax for expedited decision. Expedited decisions are made with 72hrs.

## 2020-12-28 NOTE — Telephone Encounter (Signed)
Thank you!  PA team- Please use provider's response to draft an appeal letter and submit to the plan. Thanks!

## 2020-12-31 ENCOUNTER — Encounter: Payer: Self-pay | Admitting: Pulmonary Disease

## 2020-12-31 ENCOUNTER — Other Ambulatory Visit: Payer: Self-pay

## 2020-12-31 ENCOUNTER — Other Ambulatory Visit (HOSPITAL_COMMUNITY): Payer: Self-pay

## 2020-12-31 ENCOUNTER — Ambulatory Visit (INDEPENDENT_AMBULATORY_CARE_PROVIDER_SITE_OTHER): Payer: Medicare Other | Admitting: Pulmonary Disease

## 2020-12-31 VITALS — BP 128/60 | HR 93 | Temp 98.4°F | Ht 73.0 in | Wt 208.0 lb

## 2020-12-31 DIAGNOSIS — R918 Other nonspecific abnormal finding of lung field: Secondary | ICD-10-CM

## 2020-12-31 DIAGNOSIS — J439 Emphysema, unspecified: Secondary | ICD-10-CM | POA: Diagnosis not present

## 2020-12-31 DIAGNOSIS — I240 Acute coronary thrombosis not resulting in myocardial infarction: Secondary | ICD-10-CM | POA: Diagnosis not present

## 2020-12-31 DIAGNOSIS — G20 Parkinson's disease: Secondary | ICD-10-CM

## 2020-12-31 DIAGNOSIS — J9611 Chronic respiratory failure with hypoxia: Secondary | ICD-10-CM | POA: Diagnosis not present

## 2020-12-31 MED ORDER — IBUPROFEN 800 MG PO TABS: 800.0000 mg | ORAL_TABLET | Freq: Three times a day (TID) | ORAL | 1 refills | Status: DC | PRN

## 2020-12-31 NOTE — Telephone Encounter (Signed)
Received notification from [place] regarding a prior authorization for MODAFANIL 100MG . Authorization has been APPROVED from 1.1.22 to 11.7.23.   Per test claim, copay for 30 days supply is $87.69  Authorization # [id number], CONTRACT [id number]

## 2020-12-31 NOTE — Patient Instructions (Signed)
Follow up in 8 weeks with Dr. Halford Chessman or Eric Form

## 2020-12-31 NOTE — Progress Notes (Signed)
[facility]  Chief Complaint  Patient presents with   Follow-up    PET scan review COPD     Constitutional:  BP 128/60 (BP Location: Left Arm, Cuff Size: Large)   Pulse 93   Temp 98.4 F (36.9 C) (Oral)   Ht 6\' 1"  (1.854 m)   Wt 208 lb (94.3 kg)   SpO2 91% Comment: 3L  BMI 27.44 kg/m   Past Medical History:  Blind Lt eye, Depression, DM type 2, Diverticulosis, ED, Glaucoma, HLD, HTN, Anemia, OA, Parkinson disease, Rosacea, Colon polyps  Past Surgical History:  He  has a past surgical history that includes Partial colectomy (2008); Clavicle surgery ([place]); Knee arthroscopy (1996); Cholecystectomy; Tonsillectomy; Thyroid cyst excision; Arthroscopic repair ACL; Esophagogastroduodenoscopy (egd) with propofol (N/A, 07/25/2015); Colonoscopy with propofol (N/A, 07/25/2015); IR THORACENTESIS ASP PLEURAL SPACE W/IMG GUIDE (09/17/2020); and IR THORACENTESIS ASP PLEURAL SPACE W/IMG GUIDE (10/25/2020).  Brief Summary:  James [name] is a 75 y.o. 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      Subjective:   He is here with his wife.  He had PET scan done.  Showed significant uptake in Lt lung region concerning for lung cancer.  He needs to have heart catheterization set up.    He gets winded with activity.  He doesn't do much at home.  He has cough with clear sputum.  Having more pain in his chest.  Located in midsternal area and on the right.  Physical Exam:   Appearance - well kempt, sitting in wheelchair, wearing oxygen  ENMT - no sinus tenderness, no oral exudate, no LAN, Mallampati 3 airway, no stridor  Respiratory - decreased breath sounds in left lung  CV - s1s2 regular rate and rhythm, no murmurs  Ext - no clubbing, no edema  Skin - no rashes  Psych - normal mood and affect   Pulmonary testing:  Spirometry 06/08/12 >> FEV1 1.91 (49%), FEV1% 59 Ambulatory oximetry  on room air 07/07/12 >> SpO2 86%  A1AT 07/07/12 >> 154, PI-MM PFT 05/02/20 >> FEV1 2.04 (58%), FEV1% 55, TLC 7.84 (102%), DLCO 41% Lt thoracentesis 09/17/20 >> glucose 117, protein 4.3, LDH 147, WBC 1490 (73% lymphs), cytology negative, culture negative  Chest Imaging:  CT chest 03/23/13 >> mild/diffuse centrilobular emphysema, 4 mm nodule RLL superior segment, 4 mm nodule LLL CT chest 02/20/14 >> moderate centrilobular/paraseptal emphysema, mild diffuse bronchial wall thickening, 3 mm nodule RLL and 5 mm nodule LLL no change CT chest 02/13/15 >> new 1.5 cm nodule RLL. CT chest 05/28/15 >> increased RLL irregular nodule to 2 cm PET scan 06/14/15 >> 1.93 SUV RLL 11 mm lesion CT chest 11/13/15 >> no progression CT chest 05/28/16 >> no change in lung nodule CT chest 06/01/17 >> no change CT chest 05/10/20 >> mod/severe centrilobular emphysema, bronchial thickening, no change in 1.2 x 0.7 cm RLL nodule, no change 4 mm LLL nodule and 3 mm RLL nodule CT chest 11/19/20 >> central mass with obstruction of LLL with post obstructive pneumonia, Lt pleural effusion, atherosclerosis PET scan 12/27/20 >> central LLL mass with necrosis with marked peripheral hypermetabolism consistent with primary neoplasm  Sleep Tests:  PSG 01/25/08 >> RDI 6.8, SpO2 low 84%.  Spent 79% of test time with SpO2 < 90% ONO with RA 06/16/12 >> Test time 9 hrs 21 min.  Mean SpO2 89%, low SpO2 63%.  Spent 2 hrs 25 min with SpO2 <  88% PSG 07/13/19 >> AHI 3.9, SpO2 low 83%  Cardiac Tests:  Echo 06/06/20 >> EF 60 to 65%, grade 1 DD, aortic root 45 mm  Social History:  He  reports that he quit smoking about 5 years ago. His smoking use included cigarettes. He has a 75.00 pack-year smoking history. He has never used smokeless tobacco. He reports current alcohol use. He reports that he does not use drugs.  Family History:  His family history includes Alcohol abuse in his father; Asthma in his mother; CAD in his father; COPD in his mother; Cancer  in his maternal grandmother; Emphysema in his mother; Heart disease in his father.     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 - almost certainly is lung cancer - reviewed PET scan findings - he would like to continue aggressive interventions - explained he would need to have cardiac assessment completed first before proceeding with bronchoscopy - explained the concerns about risk related to bronchoscopy and anesthesia since he will likely need procedure for debulking of tumor causing airway obstruction  COPD with emphysema. - continue yupelri, pulmicort, peforomist - prn albuterol   Chronic respiratory failure from COPD. - goal SpO2 > 90% - using 3 to 5 liters oxygen, depending on his activity level   Parkinson's disease. - followed by Dr. Star Age with [facility]  Daytime sleepiness, fatigue. - his insurance denied coverage for provigil  Time Spent Involved in Patient Care on Day of Examination:  35 minutes  Follow up:   Patient Instructions  Follow up in 8 weeks with Dr. Halford Chessman or Eric Form  Medication List:   Allergies as of 12/31/2020       Reactions   Aspirin Other (See Comments)   Codeine Itching   Morphine Itching        Medication List        Accurate as of December 31, 2020  3:20 PM. If you have any questions, ask your nurse or doctor.          albuterol 108 (90 Base) MCG/ACT inhaler Commonly known as: VENTOLIN HFA Inhale 1-2 puffs into the lungs every 6 (six) hours as needed for wheezing or shortness of breath.   atorvastatin 20 MG tablet Commonly known as: LIPITOR Take 20 mg by mouth at bedtime.   budesonide 0.5 MG/2ML nebulizer solution Commonly known as: PULMICORT Take 2 mLs (0.5 mg total) by nebulization 2 (two) times daily.   buPROPion 300 MG 24 hr tablet Commonly known as: WELLBUTRIN XL Take 300 mg by mouth daily.   calcium-vitamin D 250-125 MG-UNIT tablet Commonly known as:  OSCAL WITH D Take 1 tablet by mouth daily.   carbidopa-levodopa 25-100 MG tablet Commonly known as: SINEMET IR Take 1.5 tablets by mouth 3 (three) times daily.   Carbidopa-Levodopa ER 25-100 MG tablet controlled release Commonly known as: SINEMET CR Take 1 tablet by mouth 2 (two) times daily.   cetirizine 10 MG tablet Commonly known as: ZYRTEC Take 10 mg by mouth daily.   donepezil 10 MG tablet Commonly known as: ARICEPT Take 1 tablet (10 mg total) by mouth at bedtime.   ferrous gluconate 324 MG tablet Commonly known as: FERGON TAKE 1 TABLET BY MOUTH DAILY WITH BREAKFAST   formoterol 20 MCG/2ML nebulizer solution Commonly known as: PERFOROMIST Take 2 mLs (20 mcg total) by nebulization 2 (two) times daily.   Green Tea 315 MG Caps Take 315 mg by mouth daily.   hydrochlorothiazide 25  MG tablet Commonly known as: HYDRODIURIL Take 25 mg by mouth daily.   HYDROcodone bit-homatropine 5-1.5 MG/5ML syrup Commonly known as: HYCODAN Take 5 mLs by mouth every 6 (six) hours as needed for cough.   ibuprofen 800 MG tablet Commonly known as: ADVIL Take 1 tablet (800 mg total) by mouth every 8 (eight) hours as needed for moderate pain. Started by: Chesley Mires, MD   isosorbide mononitrate 30 MG 24 hr tablet Commonly known as: IMDUR Take 1 tablet (30 mg total) by mouth daily.   MEGARED OMEGA-3 KRILL OIL PO Take 1 capsule by mouth daily.   metFORMIN 500 MG tablet Commonly known as: GLUCOPHAGE Take 1,000 mg by mouth 2 (two) times daily with a meal.   mirtazapine 15 MG tablet Commonly known as: REMERON Take 15 mg by mouth at bedtime.   multivitamin with minerals tablet Take 1 tablet by mouth daily.   nitroGLYCERIN 0.4 MG SL tablet Commonly known as: NITROSTAT Place 1 tablet (0.4 mg total) under the tongue every 5 (five) minutes as needed for chest pain.   omeprazole 20 MG tablet Commonly known as: PRILOSEC OTC Take 20 mg by mouth daily with breakfast.   Oyster Shell  Calcium 500 MG Tabs   Provigil 100 MG tablet Generic drug: modafinil Take 1 tablet (100 mg total) by mouth daily.   revefenacin 175 MCG/3ML nebulizer solution Commonly known as: YUPELRI Take 3 mLs (175 mcg total) by nebulization daily.   sertraline 100 MG tablet Commonly known as: ZOLOFT Take 150 mg by mouth daily.        Signature:  Chesley Mires, MD [place] Pager - [phone] 12/31/2020, 3:20 PM

## 2021-01-02 NOTE — Telephone Encounter (Signed)
Patient had PET scan and had f/u with Dr. Halford Chessman on 11/7, nothing further needed.

## 2021-01-10 ENCOUNTER — Telehealth: Payer: Self-pay | Admitting: Pulmonary Disease

## 2021-01-10 MED ORDER — MODAFINIL 100 MG PO TABS: 100.0000 mg | ORAL_TABLET | Freq: Every day | ORAL | 5 refills | Status: AC

## 2021-01-10 NOTE — Telephone Encounter (Signed)
Refill sent.

## 2021-01-10 NOTE — Telephone Encounter (Signed)
Spoke with  James [name] and informed the refill was sent. Nothing further needed at this time.

## 2021-01-10 NOTE — Telephone Encounter (Signed)
Called and spoke with patient's wife [name]. She was requesting to have the generic modafinil 100mg  sent in instead of the Provigil. The PA that was completed and approved last month was for the generic. The RX that was written last month was written as DAW.   Pharmacy is [facility] [employer] on [address].   Dr. Halford Chessman, can you please advise? Thanks!

## 2021-01-14 ENCOUNTER — Encounter: Payer: Self-pay | Admitting: Internal Medicine

## 2021-01-14 ENCOUNTER — Ambulatory Visit (INDEPENDENT_AMBULATORY_CARE_PROVIDER_SITE_OTHER): Payer: Medicare Other | Admitting: Internal Medicine

## 2021-01-14 ENCOUNTER — Other Ambulatory Visit: Payer: Self-pay

## 2021-01-14 VITALS — BP 112/64 | HR 94 | Ht 73.0 in | Wt 210.0 lb

## 2021-01-14 DIAGNOSIS — R079 Chest pain, unspecified: Secondary | ICD-10-CM | POA: Diagnosis not present

## 2021-01-14 DIAGNOSIS — Z01812 Encounter for preprocedural laboratory examination: Secondary | ICD-10-CM | POA: Diagnosis not present

## 2021-01-14 DIAGNOSIS — J439 Emphysema, unspecified: Secondary | ICD-10-CM | POA: Diagnosis not present

## 2021-01-14 DIAGNOSIS — I2584 Coronary atherosclerosis due to calcified coronary lesion: Secondary | ICD-10-CM | POA: Diagnosis not present

## 2021-01-14 DIAGNOSIS — R918 Other nonspecific abnormal finding of lung field: Secondary | ICD-10-CM | POA: Diagnosis not present

## 2021-01-14 DIAGNOSIS — I2510 Atherosclerotic heart disease of native coronary artery without angina pectoris: Secondary | ICD-10-CM | POA: Diagnosis not present

## 2021-01-14 MED ORDER — ISOSORBIDE MONONITRATE ER 60 MG PO TB24: 60.0000 mg | ORAL_TABLET | Freq: Every day | ORAL | 3 refills | Status: DC

## 2021-01-14 NOTE — Progress Notes (Signed)
Cardiology Office Note:    Date:  01/14/2021   ID:  James [name], DOB 07/11/1945, MRN [id number]  PCP:  [name], MD  Cardiologist:  [name], MD  Electrophysiologist:  None   Referring MD: [name], MD   Chief Complaint/Reason for Referral: Chest pain  History of Present Illness:    James [name] is a 75 y.o. male with a history of diabetes, hypertension, hyperlipidemia, and COPD coming in today for follow-up.  Today, he is accompanied by his wife and is on supplemental oxygen. Since his last visit, a mass was found in his lungs via PET scan on 11/2. He requires cardiovascular risk stratification. We discussed the procedure and associated risks. He is open to undergoing a heart catheterization. We discussed noninvasive stress testing however regadenason nuclear stress may be unsafe with degree of COPD, and CCTA would be suboptimal with HR in 90s-100s with inability to beta block adequately due to lung disease.   He initially endorsed chest pain at our initial meeting on 10/24/20. He is still experiencing chest pain. Recently, he also reports localized pain on the R lateral chest. He reports the Imdur does not help. However, his wife reports his chest pain improved with the Imdur, particularly this was noted at his last visit.   His lung disease is followed by his pulmonologist Dr. Halford Chessman.  The patient denies PND, orthopnea, or leg swelling. Denies fever, chills, nausea, or vomiting. Denies syncope, presyncope. Denies dizziness or lightheadedness.   Past Medical History:  Diagnosis Date   Allergy    seasonal   Blind left eye    legally   Blood transfusion    2002   COPD (chronic obstructive pulmonary disease) (HCC)    Depression    Diabetes mellitus, type 2 (HCC)    Diverticulosis    ED (erectile dysfunction)    Glaucoma    Hyperlipidemia    Hypertension    Insomnia    Internal and external bleeding hemorrhoids    Iron deficiency anemia     Osteoarthritis    Rosacea    Tremor of both hands    Tubular adenoma of colon 03/28/2011    Past Surgical History:  Procedure Laterality Date   ARTHROSCOPIC REPAIR ACL     [place]   right   COLONOSCOPY WITH PROPOFOL N/A 07/25/2015   Procedure: COLONOSCOPY WITH PROPOFOL;  Surgeon: [name], MD;  Location: [facility];  Service: Endoscopy;  Laterality: N/A;   ESOPHAGOGASTRODUODENOSCOPY (EGD) WITH PROPOFOL N/A 07/25/2015   Procedure: ESOPHAGOGASTRODUODENOSCOPY (EGD) WITH PROPOFOL;  Surgeon: [name], MD;  Location: [facility];  Service: Endoscopy;  Laterality: N/A;   IR THORACENTESIS ASP PLEURAL SPACE W/IMG GUIDE  09/17/2020   IR THORACENTESIS ASP PLEURAL SPACE W/IMG GUIDE  10/25/2020   KNEE ARTHROSCOPY  1996   PARTIAL COLECTOMY  2008   THYROID CYST EXCISION     TONSILLECTOMY      Current Medications: Current Meds  Medication Sig   atorvastatin (LIPITOR) 20 MG tablet Take 20 mg by mouth at bedtime.    buPROPion (WELLBUTRIN XL) 300 MG 24 hr tablet Take 300 mg by mouth daily.   calcium-vitamin D (OSCAL WITH D) 250-125 MG-UNIT per tablet Take 1 tablet by mouth daily.   carbidopa-levodopa (SINEMET IR) 25-100 MG tablet Take 1.5 tablets by mouth 3 (three) times daily.   Carbidopa-Levodopa ER (SINEMET CR) 25-100 MG tablet controlled release Take 1 tablet  by mouth 2 (two) times daily.   cetirizine (ZYRTEC) 10 MG tablet Take 10 mg by mouth daily.   donepezil (ARICEPT) 10 MG tablet Take 1 tablet (10 mg total) by mouth at bedtime.   ferrous gluconate (FERGON) 324 MG tablet TAKE 1 TABLET BY MOUTH DAILY WITH BREAKFAST   formoterol (PERFOROMIST) 20 MCG/2ML nebulizer solution Take 2 mLs (20 mcg total) by nebulization 2 (two) times daily.   Green Tea 315 MG CAPS Take 315 mg by mouth daily.   hydrochlorothiazide (HYDRODIURIL) 25 MG tablet Take 25 mg by mouth daily.   ibuprofen (ADVIL) 800 MG tablet Take 1 tablet (800 mg total) by mouth every 8  (eight) hours as needed for moderate pain.   isosorbide mononitrate (IMDUR) 30 MG 24 hr tablet Take 1 tablet (30 mg total) by mouth daily.   MEGARED OMEGA-3 KRILL OIL PO Take 1 capsule by mouth daily.   metFORMIN (GLUCOPHAGE) 500 MG tablet Take 1,000 mg by mouth 2 (two) times daily with a meal.    mirtazapine (REMERON) 15 MG tablet Take 15 mg by mouth at bedtime.   modafinil (PROVIGIL) 100 MG tablet Take 1 tablet (100 mg total) by mouth daily.   Multiple Vitamins-Minerals (MULTIVITAMIN WITH MINERALS) tablet Take 1 tablet by mouth daily.   nitroGLYCERIN (NITROSTAT) 0.4 MG SL tablet Place 1 tablet (0.4 mg total) under the tongue every 5 (five) minutes as needed for chest pain.   omeprazole (PRILOSEC OTC) 20 MG tablet Take 20 mg by mouth daily with breakfast.   Oyster Shell Calcium 500 MG TABS    revefenacin (YUPELRI) 175 MCG/3ML nebulizer solution Take 3 mLs (175 mcg total) by nebulization daily.   sertraline (ZOLOFT) 100 MG tablet Take 150 mg by mouth daily.     Allergies:   Aspirin, Codeine, and Morphine   Social History   Tobacco Use   Smoking status: Former    Packs/day: 1.50    Years: 50.00    Pack years: 75.00    Types: Cigarettes    Quit date: 04/30/2015    Years since quitting: 5.7   Smokeless tobacco: Never   Tobacco comments:    States he sometimes sneaks a cigarette  Vaping Use   Vaping Use: Never used  Substance Use Topics   Alcohol use: Yes    Alcohol/week: 0.0 standard drinks    Comment: once yearly   Drug use: No     Family History: The patient's family history includes Alcohol abuse in his father; Asthma in his mother; CAD in his father; COPD in his mother; Cancer in his maternal grandmother; Emphysema in his mother; Heart disease in his father. There is no history of Colon cancer.  ROS:   Please see the history of present illness.    (+) Chest pain All other systems reviewed and are negative.  EKGs/Labs/Other Studies Reviewed:    The following studies were  reviewed today: [facility] Pet 12/26/20 1. 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 FDG accumulation in collapse lung peripheral to the central lesion is indeterminate and may be infectious/inflammatory although tumor spread not excluded. 2. No hypermetabolic lymphadenopathy in the mediastinum or right hilum. No evidence for distant hypermetabolic disease in the abdomen/pelvis. 3. Small focus of hypermetabolism anterior to the right femoral neck without underlying bony or soft tissue lesion. Continued close attention on follow-up recommended. 4. Possible 8 mm distal left ureteral stone without left hydroureteronephrosis. 5. Tiny focus of hypermetabolism  medial to the left mandible in the region of a tiny calcification. Imaging features may be related to a sialolith. 6.  Aortic Atherosclerois (ICD10-170.0) 7.  Emphysema. (MHD62-I29.9)  CT Chest 11/17/20 1. Possible obstructing central neoplasm in the left lower lobe with postobstructive atelectasis and airspace consolidation throughout the left lung and moderate parapneumonic left pleural effusion. Given the central location of this suspected lesion, further evaluation with bronchoscopy is suggested if clinically appropriate. Alternatively, PET-CT could be considered. 2. Aortic atherosclerosis, in addition to left main and 3 vessel coronary artery disease. Assessment for potential risk factor modification, dietary therapy or pharmacologic therapy may be warranted, if clinically indicated. 3. Possible left-sided hydronephrosis incompletely imaged. This could be further evaluated with renal ultrasound. These results will be called to the ordering clinician or representative by the Radiologist Assistant, and communication documented in the PACS or [employer]. Aortic atherosclerosis.  Echo 06/06/20 1. Left ventricular ejection fraction, by estimation, is 60 to 65%.  The left ventricle has normal function. The left ventricle has no regional wall motion abnormalities. Left ventricular diastolic parameters are consistent with Grade I diastolic dysfunction (impaired relaxation).   2. Right ventricular systolic function is normal. The right ventricular size is normal. Tricuspid regurgitation signal is inadequate for assessing PA pressure.   3. The mitral valve is normal in structure. No evidence of mitral valve  regurgitation. No evidence of mitral stenosis.   4. The aortic valve is tricuspid. Aortic valve regurgitation is trivial. No aortic stenosis is present.   5. Aortic dilatation noted. There is moderate dilatation of the aortic root, measuring 45 mm.   6. The inferior vena cava is normal in size with greater than 50% respiratory variability, suggesting right atrial pressure of 3 mmHg.   Monitor 09/01/17 Sinus bradycardia, normal sinus rhythm and sinus tachycardia with average heart rate 73 bpm. The heart rate ranged from 56 to 122 bpm. Frequent PACs, trigeminal PACs PAC load less than 2%. Rare PVC with PVC load less than 1%  EKG:   01/14/21: Sinus rhythm, non-specific ST-T wave abnormality  Imaging studies that I have independently reviewed today: PET CT 12/27/20, as noted above.  Recent Labs: 10/22/2020: Hemoglobin 8.7; Platelet Count 441  Recent Lipid Panel No results found for: CHOL, TRIG, HDL, CHOLHDL, VLDL, LDLCALC, LDLDIRECT  Physical Exam:    VS:  BP 112/64 (BP Location: Left Arm, Patient Position: Sitting, Cuff Size: Normal)   Pulse 94   Ht 6\' 1"  (1.854 m)   Wt 210 lb (95.3 kg)   SpO2 99%   BMI 27.71 kg/m     Wt Readings from Last 5 Encounters:  01/14/21 210 lb (95.3 kg)  12/31/20 208 lb (94.3 kg)  12/12/20 205 lb (93 kg)  11/15/20 205 lb (93 kg)  11/07/20 202 lb (91.6 kg)    Constitutional: No acute distress Eyes: sclera non-icteric, normal conjunctiva and lids ENMT: normal dentition, moist mucous membranes Cardiovascular:  regular rhythm, normal rate, no murmur. S1 and S2 normal. No jugular venous distention.  Respiratory: clear to auscultation bilaterally GI : normal bowel sounds, soft and nontender. No distention.   MSK: extremities warm, well perfused. No edema.  NEURO: grossly nonfocal exam, moves all extremities. PSYCH: alert and oriented x 3, normal mood and affect.   ASSESSMENT:    1. Chest pain, unspecified type   2. Chest pain of uncertain etiology   3. Pre-procedure lab exam   4. Coronary artery calcification   5. Pulmonary emphysema, unspecified emphysema type ([place])  6. Lung mass    PLAN:    I have been asked to see the patient for chest pain, and we started titrating antianginal therapy to see if we can manage without intervention since the patient is high risk for any procedure. Unfortunately it seems as if he is having accelerating angina, that is intermittently responsive to nitrates. This could also be pain from lung mass, it will be difficult to determine until ischemic evaluation is complete.   I have also been asked to comment on periprocedural risk stratification. With accelerating angina and and significant COPD, he is high risk for procedures at a minimum. If he has severe LM or LM equivalent disease, he may be prohibitive risk for procedures. Next best test is a diagnostic coronary angiography to define coronary anatomy. I have reviewed options with patient and wife in great detail. I have described that he would not likely be a CABG candidate due to need for mechanical ventilation and my concern that we would not be able to liberate patient from the vent after surgery. In addition, we would not want to intervene on coronary lesions until after management of his lung cancer is complete. I spoke to Dr. Halford Chessman on the phone while the patient was in the office. Plan is for bronchoscopy with possible debulking. This will likely involve some bleeding risk.   Increase dose of Imdur to 60 mg and  continue SL nitro as needed. ED precautions given. Plan for [facility] next available for ischemic workup. I am concerned he will have proximal CAD given location of coronary calcifications in LM, proximal LAD, and proximal RCA based on chest CT.   Total time of encounter: 49 minutes total time of encounter, including 30 minutes spent in face-to-face patient care on the date of this encounter. This time includes coordination of care and counseling regarding above mentioned problem list. Remainder of non-face-to-face time involved reviewing chart documents/testing relevant to the patient encounter and documentation in the medical record. I have independently reviewed documentation from referring provider.   Cherlynn Kaiser, MD, [place]   Shared Decision Making/Informed Consent:   Shared Decision Making/Informed Consent The risks [stroke (1 in 1000), death (1 in 6), kidney failure [usually temporary] (1 in 500), bleeding (1 in 200), allergic reaction [possibly serious] (1 in 200)], benefits (diagnostic support and management of coronary artery disease) and alternatives of a cardiac catheterization were discussed in detail with James [name] and he is willing to proceed.   Medication Adjustments/Labs and Tests Ordered: Current medicines are reviewed at length with the patient today.  Concerns regarding medicines are outlined above.   Orders Placed This Encounter  Procedures   Basic metabolic panel   CBC   EKG 12-Lead     Meds ordered this encounter  Medications   isosorbide mononitrate (IMDUR) 60 MG 24 hr tablet    Sig: Take 1 tablet (60 mg total) by mouth daily.    Dispense:  30 tablet    Refill:  3     Patient Instructions  Medication Instructions:  INCREASE IMDUR TO 60mg  DAILY  *If you need a refill on your cardiac medications before your next appointment, please call your pharmacy*  Lab Work: [facility] LAB OPEN Monday-Friday FROM 8AM - 4PM If you have labs (blood work) drawn today and your tests are completely normal, you will receive your results only by: [place] (if you have MyChart)  OR A paper copy in the mail If you have any lab test that is abnormal or we need to change your treatment, we will call you to review the results.  Testing/Procedures: Your physician has requested that you have a cardiac catheterization. Cardiac catheterization is used to diagnose and/or treat various heart conditions. Doctors may recommend this procedure for a number of different reasons. The most common reason is to evaluate chest pain. Chest pain can be a symptom of coronary artery disease (CAD), and cardiac catheterization can show whether plaque is narrowing or blocking your heart's arteries. This procedure is also used to evaluate the valves, as well as measure the blood flow and oxygen levels in different parts of your heart. For further information please visit [email or web address]. Please follow instruction sheet, as given. WE WILL REACH OUT TO YOU WITH TIME, DATE AND INSTRUCTIONS  Follow-Up: At [facility], you and your health needs are our priority.  As part of our continuing mission to provide you with exceptional heart care, we have created designated Provider Care Teams.  These Care Teams include your primary Cardiologist (physician) and Advanced Practice Providers (APPs -  Physician Assistants and Nurse Practitioners) who all work together to provide you with the care you need, when you need it.  Your next appointment:   AS SCHEDULED WITH Dr. Percival Spanish   The format for your next appointment:   In Person   [facility] as a scribe for [name], MD.,have documented all relevant documentation on the behalf of [name], MD,as directed by  [name], MD while in the presence of [name], MD.  I, [name], MD, have reviewed all  documentation for this visit. The documentation on today's date of service for the exam, diagnosis, procedures, and orders are all accurate and complete.

## 2021-01-14 NOTE — Progress Notes (Signed)
Cardiology Office Note:    Date:  11/15/2020   ID:  James [name], DOB [birth year], MRN [id number]  PCP:  James Pretty, MD  Cardiologist:  James Munroe, MD  Electrophysiologist:  None   Referring MD: James Pretty, MD   Chief Complaint/Reason for Referral: Chest pain, CAC, COPD  History of Present Illness:    James [name] is a 75 y.o. male with a history of Parkinson's disease, COPD with chronic O2 for chronic respiratory failure, DM2, HLD, HTN, Iron deficiency anemia with worsening recent anemia, and recurrent left pleural effusion who presents for evaluation of chest pain.   Some improvement in chest pain and shortness of breath with thoracentesis and initiation of Imdur. We discussed sequela of possible cardiac catheterization including PCI and CABG. Described for patient and wife that he is unlikely to be a CABG candidate if multivessel disease detected given COPD and high risk if he required mechanical ventilation for surgery or procedures.   Prior visit: Chest pain esp when coughing. Chest pain with activity or exertion or worked up. Sitting down and breathing O2 makes pain better. Several months of chest pain. Chest pain with 4 steps - feels very weak and has to stop. Cor cals noted on CTA chest in high risk distribution, LMCA, prox LAD and prox RCA. Long smoking history leading to COPD, with other risk factors of HLD and HTN as well as DM2. Also having presyncope but no syncope. Discussed concern for LM disease. Father had stent at age 31. James [name], wife, Therapist, sports (field of OBGYN) accompanies him for the visit and provides collaborative history.   Past Medical History:  Diagnosis Date   Allergy    seasonal   Blind left eye    legally   Blood transfusion    2002   COPD (chronic obstructive pulmonary disease) (HCC)    Depression    Diabetes mellitus, type 2 (HCC)    Diverticulosis    ED (erectile dysfunction)    Glaucoma    Hyperlipidemia    Hypertension    Insomnia     Internal and external bleeding hemorrhoids    Iron deficiency anemia    Osteoarthritis    Rosacea    Tremor of both hands    Tubular adenoma of colon 03/28/2011    Past Surgical History:  Procedure Laterality Date   ARTHROSCOPIC REPAIR ACL     [place]   right   COLONOSCOPY WITH PROPOFOL N/A 07/25/2015   Procedure: COLONOSCOPY WITH PROPOFOL;  Surgeon: James Artist, MD;  Location: [facility];  Service: Endoscopy;  Laterality: N/A;   ESOPHAGOGASTRODUODENOSCOPY (EGD) WITH PROPOFOL N/A 07/25/2015   Procedure: ESOPHAGOGASTRODUODENOSCOPY (EGD) WITH PROPOFOL;  Surgeon: James Artist, MD;  Location: [facility];  Service: Endoscopy;  Laterality: N/A;   INTRAVASCULAR PRESSURE WIRE/FFR STUDY N/A 01/22/2021   Procedure: INTRAVASCULAR PRESSURE WIRE/FFR STUDY;  Surgeon: [name], Peter M, MD;  Location: [place] [facility];  Service: Cardiovascular;  Laterality: N/A;   IR THORACENTESIS ASP PLEURAL SPACE W/IMG GUIDE  09/17/2020   IR THORACENTESIS ASP PLEURAL SPACE W/IMG GUIDE  10/25/2020   KNEE ARTHROSCOPY  1996   LEFT HEART CATH AND CORONARY ANGIOGRAPHY N/A 01/22/2021   Procedure: LEFT HEART CATH AND CORONARY ANGIOGRAPHY;  Surgeon: [name], Peter M, MD;  Location: [facility];  Service: Cardiovascular;  Laterality: N/A;   PARTIAL COLECTOMY  2008   THYROID CYST EXCISION     TONSILLECTOMY  Current Medications: Current Meds  Medication Sig   atorvastatin (LIPITOR) 20 MG tablet Take 20 mg by mouth at bedtime.    budesonide (PULMICORT) 0.5 MG/2ML nebulizer solution Take 2 mLs (0.5 mg total) by nebulization 2 (two) times daily. (Patient not taking: Reported on 01/14/2021)   buPROPion (WELLBUTRIN XL) 300 MG 24 hr tablet Take 150 mg by mouth 2 (two) times daily.   calcium-vitamin D (OSCAL WITH D) 250-125 MG-UNIT per tablet Take 1 tablet by mouth daily.   carbidopa-levodopa (SINEMET IR) 25-100 MG tablet Take 1.5 tablets by mouth 3 (three) times  daily.   Carbidopa-Levodopa ER (SINEMET CR) 25-100 MG tablet controlled release Take 1 tablet by mouth 2 (two) times daily.   cetirizine (ZYRTEC) 10 MG tablet Take 10 mg by mouth 2 (two) times daily.   donepezil (ARICEPT) 10 MG tablet Take 1 tablet (10 mg total) by mouth at bedtime.   ferrous gluconate (FERGON) 324 MG tablet TAKE 1 TABLET BY MOUTH DAILY WITH BREAKFAST   formoterol (PERFOROMIST) 20 MCG/2ML nebulizer solution Take 2 mLs (20 mcg total) by nebulization 2 (two) times daily. (Patient taking differently: Take 20 mcg by nebulization daily as needed (cough).)   Green Tea 315 MG CAPS Take 315 mg by mouth daily.   hydrochlorothiazide (HYDRODIURIL) 25 MG tablet Take 25 mg by mouth daily.   MEGARED OMEGA-3 KRILL OIL PO Take 1 capsule by mouth daily.   mirtazapine (REMERON) 15 MG tablet Take 15 mg by mouth at bedtime.   Multiple Vitamins-Minerals (MULTIVITAMIN WITH MINERALS) tablet Take 1 tablet by mouth daily. Centrum   nitroGLYCERIN (NITROSTAT) 0.4 MG SL tablet Place 1 tablet (0.4 mg total) under the tongue every 5 (five) minutes as needed for chest pain.   omeprazole (PRILOSEC OTC) 20 MG tablet Take 20 mg by mouth daily with breakfast.   revefenacin (YUPELRI) 175 MCG/3ML nebulizer solution Take 3 mLs (175 mcg total) by nebulization daily.   sertraline (ZOLOFT) 100 MG tablet Take 150 mg by mouth daily.   [DISCONTINUED] albuterol (VENTOLIN HFA) 108 (90 Base) MCG/ACT inhaler Inhale 1-2 puffs into the lungs every 6 (six) hours as needed for wheezing or shortness of breath. (Patient not taking: Reported on 12/31/2020)   [DISCONTINUED] ferrous sulfate 325 (65 FE) MG tablet Take 325 mg by mouth 2 (two) times daily with a meal.  (Patient not taking: Reported on 12/19/2020)   [DISCONTINUED] isosorbide mononitrate (IMDUR) 30 MG 24 hr tablet Take 0.5 tablets (15 mg total) by mouth daily.   [DISCONTINUED] levalbuterol (XOPENEX) 0.31 MG/3ML nebulizer solution Take 3 mLs (0.31 mg total) by nebulization  every 4 (four) hours as needed for wheezing. (Patient not taking: Reported on 12/19/2020)   [DISCONTINUED] metFORMIN (GLUCOPHAGE) 500 MG tablet Take 500 mg by mouth 2 (two) times daily with a meal.   [DISCONTINUED] Oyster Shell Calcium 500 MG TABS      Allergies:   Aspirin, Codeine, and Morphine   Social History   Tobacco Use   Smoking status: Former    Packs/day: 1.50    Years: 50.00    Pack years: 75.00    Types: Cigarettes    Quit date: 04/30/2015    Years since quitting: 5.7   Smokeless tobacco: Never   Tobacco comments:    States he sometimes sneaks a cigarette  Vaping Use   Vaping Use: Never used  Substance Use Topics   Alcohol use: Yes    Alcohol/week: 0.0 standard drinks    Comment: once yearly   Drug use:  No     Family History: The patient's family history includes Alcohol abuse in his father; Asthma in his mother; CAD in his father; COPD in his mother; Cancer in his maternal grandmother; Emphysema in his mother; Heart disease in his father. There is no history of Colon cancer.  ROS:   Please see the history of present illness.    All other systems reviewed and are negative.  EKGs/Labs/Other Studies Reviewed:    The following studies were reviewed today:  EKG:  n/a  Recent Labs: 01/15/2021: BUN 12; Creatinine, Ser 0.84; Hemoglobin 8.4; Platelets 407; Potassium 3.9; Sodium 149  Recent Lipid Panel No results found for: CHOL, TRIG, HDL, CHOLHDL, VLDL, LDLCALC, LDLDIRECT  Physical Exam:    VS:  BP (!) 150/70   Pulse (!) 105   Ht 6\' 1"  (1.854 [name])   Wt 205 lb (93 kg)   SpO2 97%   BMI 27.05 kg/[name]     Wt Readings from Last 5 Encounters:  01/22/21 208 lb (94.3 kg)  01/14/21 210 lb (95.3 kg)  12/31/20 208 lb (94.3 kg)  12/12/20 205 lb (93 kg)  11/15/20 205 lb (93 kg)    Constitutional: appears frail, weak and tired. Skin appears pale.  Eyes: sclera non-icteric, normal conjunctiva and lids ENMT: moist mucous membranes Cardiovascular: regular rhythm, normal  rate, no murmurs. S1 and S2 normal. No jugular venous distention.  Respiratory: bilateral end expiratory wheeze, left base diminished. GI : normal bowel sounds, soft and nontender. No distention.   MSK: extremities cool. No edema.  NEURO: grossly nonfocal exam, moves all extremities. PSYCH: alert and oriented x 3, normal mood and affect.   ASSESSMENT:    1. Chest pain of uncertain etiology   2. Primary hypertension   3. Hyperlipidemia, unspecified hyperlipidemia type   4. Iron deficiency anemia due to chronic blood loss   5. Heme positive stool   6. Pulmonary emphysema, unspecified emphysema type ([place])     PLAN:    Chest pain of uncertain etiology - Plan: EKG 12-Lead Iron deficiency anemia due to chronic blood loss Heme positive stool Pulmonary emphysema, unspecified emphysema type (HCC) Primary hypertension Hyperlipidemia, unspecified hyperlipidemia type - chest pain improved on imdur and with thoracentesis. If chest pain worsens, recurs or patient is frequently taking nitroglycerin, will consider proceeding with cardiac catheterization. Will want to ensure no procedures planned if PCI anticipated.   Total time of encounter: 30 minutes total time of encounter, including 20 minutes spent in face-to-face patient care on the date of this encounter. This time includes coordination of care and counseling regarding above mentioned problem list. Remainder of non-face-to-face time involved reviewing chart documents/testing relevant to the patient encounter and documentation in the medical record. I have independently reviewed documentation from referring provider.   Cherlynn Kaiser, MD, [place]    Shared Decision Making/Informed Consent:       Medication Adjustments/Labs and Tests Ordered: Current medicines are reviewed at length with the patient today.  Concerns regarding medicines are outlined above.   No orders of the defined types were placed in this  encounter.   Meds ordered this encounter  Medications    isosorbide mononitrate (IMDUR) 30 MG 24 hr tablet    Sig: Take 1 tablet (30 mg total) by mouth daily.    Dispense:  30 tablet    Refill:  3    Patient Instructions  Medication Instructions:  No Changes In Medications at this time.  *If you need  a refill on your cardiac medications before your next appointment, please call your pharmacy*  Follow-Up: At [facility], you and your health needs are our priority.  As part of our continuing mission to provide you with exceptional heart care, we have created designated Provider Care Teams.  These Care Teams include your primary Cardiologist (physician) and Advanced Practice Providers (APPs -  Physician Assistants and Nurse Practitioners) who all work together to provide you with the care you need, when you need it.  Your next appointment:   3 month(s)  The format for your next appointment:   In Person  Provider:   Dr. Ellyn Hack or Dr. Percival Spanish

## 2021-01-14 NOTE — H&P (View-Only) (Signed)
Cardiology Office Note:    Date:  01/14/2021   ID:  [name], DOB 08/09/1945, MRN [id number]  PCP:  [name], MD  Cardiologist:  [name], MD  Electrophysiologist:  None   Referring MD: [name], MD   Chief Complaint/Reason for Referral: Chest pain  History of Present Illness:    Lamarius Dirr is a 75 y.o. male with a history of diabetes, hypertension, hyperlipidemia, and COPD coming in today for follow-up.  Today, he is accompanied by his wife and is on supplemental oxygen. Since his last visit, a mass was found in his lungs via PET scan on 11/2. He requires cardiovascular risk stratification. We discussed the procedure and associated risks. He is open to undergoing a heart catheterization. We discussed noninvasive stress testing however regadenason nuclear stress may be unsafe with degree of COPD, and CCTA would be suboptimal with HR in 90s-100s with inability to beta block adequately due to lung disease.   He initially endorsed chest pain at our initial meeting on 10/24/20. He is still experiencing chest pain. Recently, he also reports localized pain on the R lateral chest. He reports the Imdur does not help. However, his wife reports his chest pain improved with the Imdur, particularly this was noted at his last visit.   His lung disease is followed by his pulmonologist Dr. Halford Chessman.  The patient denies PND, orthopnea, or leg swelling. Denies fever, chills, nausea, or vomiting. Denies syncope, presyncope. Denies dizziness or lightheadedness.   Past Medical History:  Diagnosis Date   Allergy    seasonal   Blind left eye    legally   Blood transfusion    2002   COPD (chronic obstructive pulmonary disease) (HCC)    Depression    Diabetes mellitus, type 2 (HCC)    Diverticulosis    ED (erectile dysfunction)    Glaucoma    Hyperlipidemia    Hypertension    Insomnia    Internal and external bleeding hemorrhoids    Iron deficiency anemia     Osteoarthritis    Rosacea    Tremor of both hands    Tubular adenoma of colon 03/28/2011    Past Surgical History:  Procedure Laterality Date   ARTHROSCOPIC REPAIR ACL     Breese   right   COLONOSCOPY WITH PROPOFOL N/A 07/25/2015   Procedure: COLONOSCOPY WITH PROPOFOL;  Surgeon: [name], MD;  Location: [facility];  Service: Endoscopy;  Laterality: N/A;   ESOPHAGOGASTRODUODENOSCOPY (EGD) WITH PROPOFOL N/A 07/25/2015   Procedure: ESOPHAGOGASTRODUODENOSCOPY (EGD) WITH PROPOFOL;  Surgeon: [name], MD;  Location: [facility];  Service: Endoscopy;  Laterality: N/A;   IR THORACENTESIS ASP PLEURAL SPACE W/IMG GUIDE  09/17/2020   IR THORACENTESIS ASP PLEURAL SPACE W/IMG GUIDE  10/25/2020   KNEE ARTHROSCOPY  1996   PARTIAL COLECTOMY  2008   THYROID CYST EXCISION     TONSILLECTOMY      Current Medications: Current Meds  Medication Sig   atorvastatin (LIPITOR) 20 MG tablet Take 20 mg by mouth at bedtime.    buPROPion (WELLBUTRIN XL) 300 MG 24 hr tablet Take 300 mg by mouth daily.   calcium-vitamin D (OSCAL WITH D) 250-125 MG-UNIT per tablet Take 1 tablet by mouth daily.   carbidopa-levodopa (SINEMET IR) 25-100 MG tablet Take 1.5 tablets by mouth 3 (three) times daily.   Carbidopa-Levodopa ER (SINEMET CR) 25-100 MG tablet controlled release Take 1 tablet  by mouth 2 (two) times daily.   cetirizine (ZYRTEC) 10 MG tablet Take 10 mg by mouth daily.   donepezil (ARICEPT) 10 MG tablet Take 1 tablet (10 mg total) by mouth at bedtime.   ferrous gluconate (FERGON) 324 MG tablet TAKE 1 TABLET BY MOUTH DAILY WITH BREAKFAST   formoterol (PERFOROMIST) 20 MCG/2ML nebulizer solution Take 2 mLs (20 mcg total) by nebulization 2 (two) times daily.   Green Tea 315 MG CAPS Take 315 mg by mouth daily.   hydrochlorothiazide (HYDRODIURIL) 25 MG tablet Take 25 mg by mouth daily.   ibuprofen (ADVIL) 800 MG tablet Take 1 tablet (800 mg total) by mouth every 8  (eight) hours as needed for moderate pain.   isosorbide mononitrate (IMDUR) 30 MG 24 hr tablet Take 1 tablet (30 mg total) by mouth daily.   MEGARED OMEGA-3 KRILL OIL PO Take 1 capsule by mouth daily.   metFORMIN (GLUCOPHAGE) 500 MG tablet Take 1,000 mg by mouth 2 (two) times daily with a meal.    mirtazapine (REMERON) 15 MG tablet Take 15 mg by mouth at bedtime.   modafinil (PROVIGIL) 100 MG tablet Take 1 tablet (100 mg total) by mouth daily.   Multiple Vitamins-Minerals (MULTIVITAMIN WITH MINERALS) tablet Take 1 tablet by mouth daily.   nitroGLYCERIN (NITROSTAT) 0.4 MG SL tablet Place 1 tablet (0.4 mg total) under the tongue every 5 (five) minutes as needed for chest pain.   omeprazole (PRILOSEC OTC) 20 MG tablet Take 20 mg by mouth daily with breakfast.   Oyster Shell Calcium 500 MG TABS    revefenacin (YUPELRI) 175 MCG/3ML nebulizer solution Take 3 mLs (175 mcg total) by nebulization daily.   sertraline (ZOLOFT) 100 MG tablet Take 150 mg by mouth daily.     Allergies:   Aspirin, Codeine, and Morphine   Social History   Tobacco Use   Smoking status: Former    Packs/day: 1.50    Years: 50.00    Pack years: 75.00    Types: Cigarettes    Quit date: 04/30/2015    Years since quitting: 5.7   Smokeless tobacco: Never   Tobacco comments:    States he sometimes sneaks a cigarette  Vaping Use   Vaping Use: Never used  Substance Use Topics   Alcohol use: Yes    Alcohol/week: 0.0 standard drinks    Comment: once yearly   Drug use: No     Family History: The patient's family history includes Alcohol abuse in his father; Asthma in his mother; CAD in his father; COPD in his mother; Cancer in his maternal grandmother; Emphysema in his mother; Heart disease in his father. There is no history of Colon cancer.  ROS:   Please see the history of present illness.    (+) Chest pain All other systems reviewed and are negative.  EKGs/Labs/Other Studies Reviewed:    The following studies were  reviewed today: NM Pet 12/26/20 1. 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 FDG accumulation in collapse lung peripheral to the central lesion is indeterminate and may be infectious/inflammatory although tumor spread not excluded. 2. No hypermetabolic lymphadenopathy in the mediastinum or right hilum. No evidence for distant hypermetabolic disease in the abdomen/pelvis. 3. Small focus of hypermetabolism anterior to the right femoral neck without underlying bony or soft tissue lesion. Continued close attention on follow-up recommended. 4. Possible 8 mm distal left ureteral stone without left hydroureteronephrosis. 5. Tiny focus of hypermetabolism  medial to the left mandible in the region of a tiny calcification. Imaging features may be related to a sialolith. 6.  Aortic Atherosclerois (ICD10-170.0) 7.  Emphysema. (YQM25-O03.9)  CT Chest 11/17/20 1. Possible obstructing central neoplasm in the left lower lobe with postobstructive atelectasis and airspace consolidation throughout the left lung and moderate parapneumonic left pleural effusion. Given the central location of this suspected lesion, further evaluation with bronchoscopy is suggested if clinically appropriate. Alternatively, PET-CT could be considered. 2. Aortic atherosclerosis, in addition to left main and 3 vessel coronary artery disease. Assessment for potential risk factor modification, dietary therapy or pharmacologic therapy may be warranted, if clinically indicated. 3. Possible left-sided hydronephrosis incompletely imaged. This could be further evaluated with renal ultrasound. These results will be called to the ordering clinician or representative by the Radiologist Assistant, and communication documented in the PACS or [employer]. Aortic atherosclerosis.  Echo 06/06/20 1. Left ventricular ejection fraction, by estimation, is 60 to 65%.  The left ventricle has normal function. The left ventricle has no regional wall motion abnormalities. Left ventricular diastolic parameters are consistent with Grade I diastolic dysfunction (impaired relaxation).   2. Right ventricular systolic function is normal. The right ventricular size is normal. Tricuspid regurgitation signal is inadequate for assessing PA pressure.   3. The mitral valve is normal in structure. No evidence of mitral valve  regurgitation. No evidence of mitral stenosis.   4. The aortic valve is tricuspid. Aortic valve regurgitation is trivial. No aortic stenosis is present.   5. Aortic dilatation noted. There is moderate dilatation of the aortic root, measuring 45 mm.   6. The inferior vena cava is normal in size with greater than 50% respiratory variability, suggesting right atrial pressure of 3 mmHg.   Monitor 09/01/17 Sinus bradycardia, normal sinus rhythm and sinus tachycardia with average heart rate 73 bpm. The heart rate ranged from 56 to 122 bpm. Frequent PACs, trigeminal PACs PAC load less than 2%. Rare PVC with PVC load less than 1%  EKG:   01/14/21: Sinus rhythm, non-specific ST-T wave abnormality  Imaging studies that I have independently reviewed today: PET CT 12/27/20, as noted above.  Recent Labs: 10/22/2020: Hemoglobin 8.7; Platelet Count 441  Recent Lipid Panel No results found for: CHOL, TRIG, HDL, CHOLHDL, VLDL, LDLCALC, LDLDIRECT  Physical Exam:    VS:  BP 112/64 (BP Location: Left Arm, Patient Position: Sitting, Cuff Size: Normal)   Pulse 94   Ht 6\' 1"  (1.854 m)   Wt 210 lb (95.3 kg)   SpO2 99%   BMI 27.71 kg/m     Wt Readings from Last 5 Encounters:  01/14/21 210 lb (95.3 kg)  12/31/20 208 lb (94.3 kg)  12/12/20 205 lb (93 kg)  11/15/20 205 lb (93 kg)  11/07/20 202 lb (91.6 kg)    Constitutional: No acute distress Eyes: sclera non-icteric, normal conjunctiva and lids ENMT: normal dentition, moist mucous membranes Cardiovascular:  regular rhythm, normal rate, no murmur. S1 and S2 normal. No jugular venous distention.  Respiratory: clear to auscultation bilaterally GI : normal bowel sounds, soft and nontender. No distention.   MSK: extremities warm, well perfused. No edema.  NEURO: grossly nonfocal exam, moves all extremities. PSYCH: alert and oriented x 3, normal mood and affect.   ASSESSMENT:    1. Chest pain, unspecified type   2. Chest pain of uncertain etiology   3. Pre-procedure lab exam   4. Coronary artery calcification   5. Pulmonary emphysema, unspecified emphysema type ([place])  6. Lung mass    PLAN:    I have been asked to see the patient for chest pain, and we started titrating antianginal therapy to see if we can manage without intervention since the patient is high risk for any procedure. Unfortunately it seems as if he is having accelerating angina, that is intermittently responsive to nitrates. This could also be pain from lung mass, it will be difficult to determine until ischemic evaluation is complete.   I have also been asked to comment on periprocedural risk stratification. With accelerating angina and and significant COPD, he is high risk for procedures at a minimum. If he has severe LM or LM equivalent disease, he may be prohibitive risk for procedures. Next best test is a diagnostic coronary angiography to define coronary anatomy. I have reviewed options with patient and wife in great detail. I have described that he would not likely be a CABG candidate due to need for mechanical ventilation and my concern that we would not be able to liberate patient from the vent after surgery. In addition, we would not want to intervene on coronary lesions until after management of his lung cancer is complete. I spoke to Dr. Halford Chessman on the phone while the patient was in the office. Plan is for bronchoscopy with possible debulking. This will likely involve some bleeding risk.   Increase dose of Imdur to 60 mg and  continue SL nitro as needed. ED precautions given. Plan for [facility] next available for ischemic workup. I am concerned he will have proximal CAD given location of coronary calcifications in LM, proximal LAD, and proximal RCA based on chest CT.   Total time of encounter: 49 minutes total time of encounter, including 30 minutes spent in face-to-face patient care on the date of this encounter. This time includes coordination of care and counseling regarding above mentioned problem list. Remainder of non-face-to-face time involved reviewing chart documents/testing relevant to the patient encounter and documentation in the medical record. I have independently reviewed documentation from referring provider.   Cherlynn Kaiser, MD, [place]   Shared Decision Making/Informed Consent:   Shared Decision Making/Informed Consent The risks [stroke (1 in 1000), death (1 in 19), kidney failure [usually temporary] (1 in 500), bleeding (1 in 200), allergic reaction [possibly serious] (1 in 200)], benefits (diagnostic support and management of coronary artery disease) and alternatives of a cardiac catheterization were discussed in detail with Mr. Lisenbee and he is willing to proceed.   Medication Adjustments/Labs and Tests Ordered: Current medicines are reviewed at length with the patient today.  Concerns regarding medicines are outlined above.   Orders Placed This Encounter  Procedures   Basic metabolic panel   CBC   EKG 12-Lead     Meds ordered this encounter  Medications   isosorbide mononitrate (IMDUR) 60 MG 24 hr tablet    Sig: Take 1 tablet (60 mg total) by mouth daily.    Dispense:  30 tablet    Refill:  3     Patient Instructions  Medication Instructions:  INCREASE IMDUR TO 60mg  DAILY  *If you need a refill on your cardiac medications before your next appointment, please call your pharmacy*  Lab Work: BMET and Time. LAB OPEN Monday-Friday FROM 8AM - 4PM If you have labs (blood work) drawn today and your tests are completely normal, you will receive your results only by: [place] (if you have MyChart)  OR A paper copy in the mail If you have any lab test that is abnormal or we need to change your treatment, we will call you to review the results.  Testing/Procedures: Your physician has requested that you have a cardiac catheterization. Cardiac catheterization is used to diagnose and/or treat various heart conditions. Doctors may recommend this procedure for a number of different reasons. The most common reason is to evaluate chest pain. Chest pain can be a symptom of coronary artery disease (CAD), and cardiac catheterization can show whether plaque is narrowing or blocking your heart's arteries. This procedure is also used to evaluate the valves, as well as measure the blood flow and oxygen levels in different parts of your heart. For further information please visit HugeFiesta.tn. Please follow instruction sheet, as given. WE WILL REACH OUT TO YOU WITH TIME, DATE AND INSTRUCTIONS  Follow-Up: At [facility], you and your health needs are our priority.  As part of our continuing mission to provide you with exceptional heart care, we have created designated Provider Care Teams.  These Care Teams include your primary Cardiologist (physician) and Advanced Practice Providers (APPs -  Physician Assistants and Nurse Practitioners) who all work together to provide you with the care you need, when you need it.  Your next appointment:   AS SCHEDULED WITH Dr. Percival Spanish   The format for your next appointment:   In Person   [facility] as a scribe for [name], MD.,have documented all relevant documentation on the behalf of [name], MD,as directed by  [name], MD while in the presence of [name], MD.  I, [name], MD, have reviewed all  documentation for this visit. The documentation on today's date of service for the exam, diagnosis, procedures, and orders are all accurate and complete.

## 2021-01-14 NOTE — Patient Instructions (Addendum)
Medication Instructions:  INCREASE IMDUR TO 60mg  DAILY  *If you need a refill on your cardiac medications before your next appointment, please call your pharmacy*  Lab Work: BMET and [place]. LAB OPEN Monday-Friday FROM 8AM - 4PM If you have labs (blood work) drawn today and your tests are completely normal, you will receive your results only by: [place] (if you have MyChart) OR A paper copy in the mail If you have any lab test that is abnormal or we need to change your treatment, we will call you to review the results.  Testing/Procedures: Your physician has requested that you have a cardiac catheterization. Cardiac catheterization is used to diagnose and/or treat various heart conditions. Doctors may recommend this procedure for a number of different reasons. The most common reason is to evaluate chest pain. Chest pain can be a symptom of coronary artery disease (CAD), and cardiac catheterization can show whether plaque is narrowing or blocking your heart's arteries. This procedure is also used to evaluate the valves, as well as measure the blood flow and oxygen levels in different parts of your heart. For further information please visit HugeFiesta.tn. Please follow instruction sheet, as given. WE WILL REACH OUT TO YOU WITH TIME, DATE AND INSTRUCTIONS  Follow-Up: At [facility], you and your health needs are our priority.  As part of our continuing mission to provide you with exceptional heart care, we have created designated Provider Care Teams.  These Care Teams include your primary Cardiologist (physician) and Advanced Practice Providers (APPs -  Physician Assistants and Nurse Practitioners) who all work together to provide you with the care you need, when you need it.  Your next appointment:   AS SCHEDULED WITH Dr. Percival Spanish   The format for your next appointment:   In Person

## 2021-01-15 ENCOUNTER — Telehealth: Payer: Self-pay

## 2021-01-15 DIAGNOSIS — I2510 Atherosclerotic heart disease of native coronary artery without angina pectoris: Secondary | ICD-10-CM | POA: Diagnosis not present

## 2021-01-15 DIAGNOSIS — Z01812 Encounter for preprocedural laboratory examination: Secondary | ICD-10-CM | POA: Diagnosis not present

## 2021-01-15 DIAGNOSIS — I2584 Coronary atherosclerosis due to calcified coronary lesion: Secondary | ICD-10-CM | POA: Diagnosis not present

## 2021-01-15 NOTE — Telephone Encounter (Signed)
Patient and patients wife came into office for blood work today. Patient's wife sent someone to ask me if Dr. [name] could call something in for patient's pain. Went and spoke with patient and patients wife and per patients wife he has had increased chest pain last night that did not respond to nitro, patients wife states that she gave patient some Ultram last night and patient had some relief with that and would like to know if Dr. [name] could prescribe that. Advised patient's wife that she does not prescribe that type of medication, patients wife also states that patient has not tried increased dose of Imdur yet either. Advised her to go ahead and try that as well but that if pain is worsening he should go to the ER to be seen.   Spoke with Dr. [name] regarding patient and she states that Ultram may mask some of the chest pain, therefore patient should go in to the ER to be seen for symptoms.   Patient had already left the office when I went back to speak with them but I called and spoke with patients wife and advised her of Dr. Delphina Cahill recommendations. Per patient's wife he is feeling okay right now but if anything worsens or pain returns she will take him to ER to be seen.   Advised patient's wife to return call to office with any issues and she verbalized understanding.

## 2021-01-15 NOTE — Telephone Encounter (Signed)
Called patient and patient's wife to schedule Diagnostic Left Heart cath- per Dr. Leonard Schwartz on 01/14/21.   Patient is scheduled for 01/22/21 at 10:30am with Dr. [name].   Patient to arrive at 8:30 am at [facility]. Patient will need to be NPO after midnight the night before. Patient will need to hold HCTZ the day of, and Metformin the day of and 48 hours after. Patient to have labs drawn today. Confirmed no contrast allergy. Patient's wife is aware of all instructions and verbalized understanding. Will send MyChart message with instructions to patient as well.

## 2021-01-16 LAB — CBC
Hematocrit: 29.3 % — ABNORMAL LOW (ref 37.5–51.0)
Hemoglobin: 8.4 g/dL — ABNORMAL LOW (ref 13.0–17.7)
MCH: 20.2 pg — ABNORMAL LOW (ref 26.6–33.0)
MCHC: 28.7 g/dL — ABNORMAL LOW (ref 31.5–35.7)
MCV: 71 fL — ABNORMAL LOW (ref 79–97)
Platelets: 407 10*3/uL (ref 150–450)
RBC: 4.15 x10E6/uL (ref 4.14–5.80)
RDW: 17.5 % — ABNORMAL HIGH (ref 11.6–15.4)
WBC: 6.8 10*3/uL (ref 3.4–10.8)

## 2021-01-16 LAB — BASIC METABOLIC PANEL
BUN/Creatinine Ratio: 14 (ref 10–24)
BUN: 12 mg/dL (ref 8–27)
CO2: 29 mmol/L (ref 20–29)
Calcium: 9.6 mg/dL (ref 8.6–10.2)
Chloride: 104 mmol/L (ref 96–106)
Creatinine, Ser: 0.84 mg/dL (ref 0.76–1.27)
Glucose: 112 mg/dL — ABNORMAL HIGH (ref 70–99)
Potassium: 3.9 mmol/L (ref 3.5–5.2)
Sodium: 149 mmol/L — ABNORMAL HIGH (ref 134–144)
eGFR: 91 mL/min/{1.73_m2} (ref >59)

## 2021-01-21 ENCOUNTER — Telehealth: Payer: Self-pay | Admitting: *Deleted

## 2021-01-21 NOTE — Telephone Encounter (Signed)
Cardiac catheterization scheduled at [facility] for: Tuesday January 22, 2021 10:30 AM [facility] Entr[facility]) at: 8:30 AM   No solid food after midnight prior to cath, clear liquids until 5 AM day of procedure.  Medication instructions: Hold: Metformin-day of procedure and 48 hours post procedure  HCTZ-AM of procedure  Except hold medications usual morning medications can be taken pre-cath with sips of water. Patient has been advised in the past not to take aspirin due to GI bleed. This is diagnostic cath only.    Confirmed patient has responsible adult to drive home post procedure and be with patient first 24 hours after arriving home.  [facility] does allow one visitor to accompany you and wait in the hospital waiting room while you are there for your procedure. You and your visitor will be asked to wear a mask once you enter the hospital.   Patient reports does not currently have any new symptoms concerning for COVID-19 and no household members with COVID-19 like illness.    Reviewed procedure/mask/visitor instructions with patient's wife (DPR).

## 2021-01-22 ENCOUNTER — Encounter (HOSPITAL_COMMUNITY): Payer: Self-pay | Admitting: Cardiology

## 2021-01-22 ENCOUNTER — Ambulatory Visit (HOSPITAL_COMMUNITY)
Admission: RE | Disposition: A | Discharge status: Home / Self Care | Payer: Self-pay | Source: Home / Self Care | Attending: Cardiology

## 2021-01-22 ENCOUNTER — Other Ambulatory Visit: Payer: Self-pay

## 2021-01-22 ENCOUNTER — Ambulatory Visit (HOSPITAL_COMMUNITY)
Admission: RE | Admit: 2021-01-22 | Discharge: 2021-01-22 | Disposition: A | Discharge status: Home / Self Care | Payer: Medicare Other | Attending: Cardiology | Admitting: Cardiology

## 2021-01-22 DIAGNOSIS — R072 Precordial pain: Secondary | ICD-10-CM | POA: Diagnosis present

## 2021-01-22 DIAGNOSIS — R079 Chest pain, unspecified: Secondary | ICD-10-CM | POA: Diagnosis not present

## 2021-01-22 DIAGNOSIS — E119 Type 2 diabetes mellitus without complications: Secondary | ICD-10-CM | POA: Diagnosis not present

## 2021-01-22 DIAGNOSIS — I10 Essential (primary) hypertension: Secondary | ICD-10-CM | POA: Diagnosis not present

## 2021-01-22 DIAGNOSIS — J449 Chronic obstructive pulmonary disease, unspecified: Secondary | ICD-10-CM | POA: Diagnosis not present

## 2021-01-22 DIAGNOSIS — E785 Hyperlipidemia, unspecified: Secondary | ICD-10-CM | POA: Insufficient documentation

## 2021-01-22 DIAGNOSIS — J439 Emphysema, unspecified: Secondary | ICD-10-CM | POA: Diagnosis present

## 2021-01-22 DIAGNOSIS — I2510 Atherosclerotic heart disease of native coronary artery without angina pectoris: Secondary | ICD-10-CM | POA: Diagnosis not present

## 2021-01-22 DIAGNOSIS — R918 Other nonspecific abnormal finding of lung field: Secondary | ICD-10-CM

## 2021-01-22 DIAGNOSIS — C3432 Malignant neoplasm of lower lobe, left bronchus or lung: Secondary | ICD-10-CM

## 2021-01-22 HISTORY — PX: INTRAVASCULAR PRESSURE WIRE/FFR STUDY: CATH118243

## 2021-01-22 HISTORY — PX: LEFT HEART CATH AND CORONARY ANGIOGRAPHY: CATH118249

## 2021-01-22 LAB — POCT ACTIVATED CLOTTING TIME: Activated Clotting Time: 221 seconds

## 2021-01-22 LAB — GLUCOSE, CAPILLARY: Glucose-Capillary: 141 mg/dL — ABNORMAL HIGH (ref 70–99)

## 2021-01-22 SURGERY — LEFT HEART CATH AND CORONARY ANGIOGRAPHY
Anesthesia: LOCAL

## 2021-01-22 MED ORDER — FENTANYL CITRATE (PF) 100 MCG/2ML IJ SOLN
INTRAMUSCULAR | Status: AC
  Filled 2021-01-22: qty 2

## 2021-01-22 MED ORDER — ONDANSETRON HCL 4 MG/2ML IJ SOLN: 4.0000 mg | Freq: Four times a day (QID) | INTRAMUSCULAR | Status: DC | PRN

## 2021-01-22 MED ORDER — SODIUM CHLORIDE 0.9 % IV SOLN: 250.0000 mL | INTRAVENOUS | Status: DC | PRN

## 2021-01-22 MED ORDER — IOHEXOL 350 MG/ML SOLN
INTRAVENOUS | Status: DC | PRN
  Administered 2021-01-22: 75 mL

## 2021-01-22 MED ORDER — MIDAZOLAM HCL 2 MG/2ML IJ SOLN
INTRAMUSCULAR | Status: AC
  Filled 2021-01-22: qty 2

## 2021-01-22 MED ORDER — NITROGLYCERIN 1 MG/10 ML FOR IR/CATH LAB
INTRA_ARTERIAL | Status: AC
  Filled 2021-01-22: qty 10

## 2021-01-22 MED ORDER — VERAPAMIL HCL 2.5 MG/ML IV SOLN
INTRAVENOUS | Status: AC
  Filled 2021-01-22: qty 2

## 2021-01-22 MED ORDER — METFORMIN HCL 500 MG PO TABS: 500.0000 mg | ORAL_TABLET | Freq: Two times a day (BID) | ORAL | Status: DC

## 2021-01-22 MED ORDER — VERAPAMIL HCL 2.5 MG/ML IV SOLN
INTRAVENOUS | Status: DC | PRN
  Administered 2021-01-22: 10 mL via INTRA_ARTERIAL

## 2021-01-22 MED ORDER — SODIUM CHLORIDE 0.9 % WEIGHT BASED INFUSION
3.0000 mL/kg/h | INTRAVENOUS | Status: AC
  Administered 2021-01-22: 3 mL/kg/h via INTRAVENOUS

## 2021-01-22 MED ORDER — MIDAZOLAM HCL 2 MG/2ML IJ SOLN
INTRAMUSCULAR | Status: DC | PRN
  Administered 2021-01-22: 2 mg via INTRAVENOUS

## 2021-01-22 MED ORDER — ACETAMINOPHEN 325 MG PO TABS: 650.0000 mg | ORAL_TABLET | ORAL | Status: DC | PRN

## 2021-01-22 MED ORDER — FENTANYL CITRATE (PF) 100 MCG/2ML IJ SOLN
INTRAMUSCULAR | Status: DC | PRN
  Administered 2021-01-22: 25 ug via INTRAVENOUS

## 2021-01-22 MED ORDER — ASPIRIN 81 MG PO CHEW
CHEWABLE_TABLET | ORAL | Status: AC
  Filled 2021-01-22: qty 1

## 2021-01-22 MED ORDER — HEPARIN (PORCINE) IN NACL 1000-0.9 UT/500ML-% IV SOLN
INTRAVENOUS | Status: DC | PRN
  Administered 2021-01-22 (×2): 500 mL

## 2021-01-22 MED ORDER — SODIUM CHLORIDE 0.9% FLUSH: 3.0000 mL | INTRAVENOUS | Status: DC | PRN

## 2021-01-22 MED ORDER — SODIUM CHLORIDE 0.9 % WEIGHT BASED INFUSION: 1.0000 mL/kg/h | INTRAVENOUS | Status: DC

## 2021-01-22 MED ORDER — LIDOCAINE HCL (PF) 1 % IJ SOLN
INTRAMUSCULAR | Status: AC
  Filled 2021-01-22: qty 30

## 2021-01-22 MED ORDER — SODIUM CHLORIDE 0.9% FLUSH: 3.0000 mL | Freq: Two times a day (BID) | INTRAVENOUS | Status: DC

## 2021-01-22 MED ORDER — NITROGLYCERIN 1 MG/10 ML FOR IR/CATH LAB
INTRA_ARTERIAL | Status: DC | PRN
  Administered 2021-01-22: 200 ug via INTRACORONARY

## 2021-01-22 MED ORDER — HEPARIN SODIUM (PORCINE) 1000 UNIT/ML IJ SOLN
INTRAMUSCULAR | Status: DC | PRN
  Administered 2021-01-22: 4000 [IU] via INTRAVENOUS
  Administered 2021-01-22: 4500 [IU] via INTRAVENOUS

## 2021-01-22 MED ORDER — HEPARIN (PORCINE) IN NACL 1000-0.9 UT/500ML-% IV SOLN
INTRAVENOUS | Status: AC
  Filled 2021-01-22: qty 1000

## 2021-01-22 MED ORDER — HEPARIN SODIUM (PORCINE) 1000 UNIT/ML IJ SOLN
INTRAMUSCULAR | Status: AC
  Filled 2021-01-22: qty 10

## 2021-01-22 SURGICAL SUPPLY — 12 items
CATH 5FR JL3.5 JR4 ANG PIG MP (CATHETERS) ×2 IMPLANT
CATH VISTA GUIDE 6FR XBLAD3.5 (CATHETERS) ×2 IMPLANT
DEVICE RAD COMP TR BAND LRG (VASCULAR PRODUCTS) ×2 IMPLANT
GLIDESHEATH SLEND SS 6F .021 (SHEATH) ×2 IMPLANT
GUIDEWIRE INQWIRE 1.5J.035X260 (WIRE) ×2 IMPLANT
GUIDEWIRE PRESSURE X 175 (WIRE) ×2 IMPLANT
INQWIRE 1.5J .035X260CM (WIRE) ×4
KIT ESSENTIALS PG (KITS) ×2 IMPLANT
KIT HEART LEFT (KITS) ×2 IMPLANT
PACK CARDIAC CATHETERIZATION (CUSTOM PROCEDURE TRAY) ×2 IMPLANT
TRANSDUCER W/STOPCOCK (MISCELLANEOUS) ×2 IMPLANT
TUBING CIL FLEX 10 FLL-RA (TUBING) ×2 IMPLANT

## 2021-01-22 NOTE — Progress Notes (Signed)
   01/22/21 0930  Clinical Encounter Type  Visited With Patient and family together;Family  Visit Type Initial;Psychological support;Spiritual support;Social support;Pre-op  Referral From Patient;Family;Nurse   CH received page for assistance w/AD as pt. prepares for cardiac cath this AM; pt. and wife [name] had ADs completed, ready for notarization, when [facility] met them in short stay; [place] perused documents for completion and contacted notary and witnesses.  Documents notarized and signed in the presence of witnesses; pt.'s document filed in chart; wife's AD scanned by admitting staff into OnBase.    Pt. indicates that he would not want heroic life-prolonging measures in the three scenarios indicated on AD; he leaves medical decisionmaking up to his wife's discretion, however, given her experience as a nurse.  After pt. was taken to procedure, CH and wife had extended conversation in which wife shared that pt. was recently diagnosed with lung cancer.  He has been in chronic pain for the past several months and not been able to sleep from discomfort; pt. had been using ibuprofen to control pain but this no longer is sufficient, wife shared.  Wife says several providers have broached the subject of hospice but that pt. does not want to pursue this route at this time, electing to attempt chemotherapy and radiation.  Wife says that prayer is a regular part of bother her and pt.'s lives; [facility] offered prayer for strength, comfort, wisdom, and a sense of divine accompaniment for both wife and pt.  Chaplains remain available for further support as needed.

## 2021-01-22 NOTE — Interval H&P Note (Signed)
History and Physical Interval Note:  01/22/2021 10:16 AM  James [name]  has presented today for surgery, with the diagnosis of chest pain - pre op.  The various methods of treatment have been discussed with the patient and family. After consideration of risks, benefits and other options for treatment, the patient has consented to  Procedure(s): LEFT HEART CATH AND CORONARY ANGIOGRAPHY (N/A) as a surgical intervention.  The patient's history has been reviewed, patient examined, no change in status, stable for surgery.  I have reviewed the patient's chart and labs.  Questions were answered to the patient's satisfaction.     Collier [facility] 01/22/2021 10:16 AM

## 2021-01-23 MED FILL — Lidocaine HCl Local Preservative Free (PF) Inj 1%: INTRAMUSCULAR | Qty: 30 | Status: AC

## 2021-01-24 ENCOUNTER — Encounter (HOSPITAL_COMMUNITY): Payer: Self-pay | Admitting: Cardiology

## 2021-01-24 ENCOUNTER — Telehealth: Payer: Self-pay | Admitting: Pulmonary Disease

## 2021-01-24 NOTE — Telephone Encounter (Signed)
Spoke with the pt's spouse  She reports pt having minimal hemoptysis for about a wk  He is having SOB- no worse than before when seen last  She states he has been c/o "lung pain" and had recent cardiac cath  She called cards and they told her there was no cardiac issue and rec he make appt here  OV with ND tomorrow at 9:30 am  Pt aware to arrive by 9:15 and advised to seek emergent care if his symptoms persist or wosen

## 2021-01-24 NOTE — Telephone Encounter (Signed)
Pt's spouse left a voice message on my old voice mail yesterday stating pt had a cardiac cath & is now in terrible pain in his lung & side - coughing up blood.  States that there was nothing found wrong with pt's heart.  Motrin isn't helping.  Asking for reqs from VS or SG.

## 2021-01-24 NOTE — Telephone Encounter (Signed)
Pt's spouse left a voice message on my old voice mail yesterday stating pt had a cardiac cath & is now in terrible pain in his lung & side - coughing up blood.  States that there was nothing found wrong with pt's heart.  Motrin isn't helping.  Asking for reqs from [facility] or [facility].   Dr. Halford Chessman please advise

## 2021-01-25 ENCOUNTER — Encounter: Payer: Self-pay | Admitting: Internal Medicine

## 2021-01-25 ENCOUNTER — Encounter (HOSPITAL_COMMUNITY): Payer: Self-pay | Admitting: Pulmonary Disease

## 2021-01-25 ENCOUNTER — Ambulatory Visit (INDEPENDENT_AMBULATORY_CARE_PROVIDER_SITE_OTHER): Payer: Medicare Other | Admitting: Internal Medicine

## 2021-01-25 ENCOUNTER — Other Ambulatory Visit: Payer: Self-pay | Admitting: Internal Medicine

## 2021-01-25 ENCOUNTER — Encounter: Payer: Self-pay | Admitting: Pulmonary Disease

## 2021-01-25 ENCOUNTER — Other Ambulatory Visit: Payer: Self-pay

## 2021-01-25 VITALS — BP 134/70 | HR 80 | Temp 98.3°F | Ht 73.0 in | Wt 217.2 lb

## 2021-01-25 DIAGNOSIS — R918 Other nonspecific abnormal finding of lung field: Secondary | ICD-10-CM | POA: Diagnosis not present

## 2021-01-25 MED ORDER — TRAMADOL HCL 50 MG PO TABS: 50.0000 mg | ORAL_TABLET | Freq: Two times a day (BID) | ORAL | 0 refills | Status: DC | PRN

## 2021-01-25 NOTE — H&P (View-Only) (Signed)
James [name]    [id number]    1945-03-06  Primary Care Physician:Pharr, Thayer Jew, MD Date of Appointment: 01/25/2021 Established Patient Visit  Chief complaint:   Chief Complaint  Patient presents with   Acute Visit    Pt has been having worsening lung pain 6 weeks and states about a week ago he started having more problems with the hemoptysis. Pt recently had a cardiac cath performed. States his breathing has become worse.     HPI: James [name] is a 75 y.o. man with CAD. 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 Patient of Dr. Halford Chessman. He is not on any blood thinners.  Interval Updates: Here for sick visit for hemoptysis. 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 I have discussed his case with Dr. Halford Chessman his primary pulmonologist.   Last week before [facility] he could fill up a small cup. Now it has backed off and he hasn't had any in the last two days.   Pain is in his central chest and is waking him up at night. HE has it during the day as well, but more aware at night. Worse with exertion.   Was awaiting cardiac evaluation for   I have reviewed the patient's family social and past medical history and updated as appropriate.   Past Medical History:  Diagnosis Date   Allergy    seasonal   Blind left eye    legally   Blood transfusion    2002   COPD (chronic obstructive pulmonary disease) (HCC)    Depression    Diabetes mellitus, type 2 (HCC)    Diverticulosis    ED (erectile dysfunction)    Glaucoma    Hyperlipidemia    Hypertension    Insomnia    Internal and external bleeding hemorrhoids    Iron deficiency anemia    Osteoarthritis    Rosacea    Tremor of both hands    Tubular adenoma of colon 03/28/2011    Past Surgical History:  Procedure Laterality Date   ARTHROSCOPIC REPAIR ACL     [place]   right   COLONOSCOPY WITH PROPOFOL N/A 07/25/2015   Procedure: COLONOSCOPY WITH  PROPOFOL;  Surgeon: [name], MD;  Location: [facility];  Service: Endoscopy;  Laterality: N/A;   ESOPHAGOGASTRODUODENOSCOPY (EGD) WITH PROPOFOL N/A 07/25/2015   Procedure: ESOPHAGOGASTRODUODENOSCOPY (EGD) WITH PROPOFOL;  Surgeon: [name], MD;  Location: [facility];  Service: Endoscopy;  Laterality: N/A;   INTRAVASCULAR PRESSURE WIRE/FFR STUDY N/A 01/22/2021   Procedure: INTRAVASCULAR PRESSURE WIRE/FFR STUDY;  Surgeon: [name], Peter M, MD;  Location: [place] [facility];  Service: Cardiovascular;  Laterality: N/A;   IR THORACENTESIS ASP PLEURAL SPACE W/IMG GUIDE  09/17/2020   IR THORACENTESIS ASP PLEURAL SPACE W/IMG GUIDE  10/25/2020   KNEE ARTHROSCOPY  1996   LEFT HEART CATH AND CORONARY ANGIOGRAPHY N/A 01/22/2021   Procedure: LEFT HEART CATH AND CORONARY ANGIOGRAPHY;  Surgeon: [name], Peter M, MD;  Location: [place] [facility];  Service: Cardiovascular;  Laterality: N/A;   PARTIAL COLECTOMY  2008   THYROID CYST EXCISION     TONSILLECTOMY      Family History  Problem Relation Age of Onset   CAD Father    Alcohol abuse Father    Heart disease Father    COPD Mother    Asthma Mother    Emphysema Mother  Cancer Maternal Grandmother    Colon cancer Neg Hx     Social History   Occupational History   Occupation: retired    Fish farm manager: RETIRED  Tobacco Use   Smoking status: Former    Packs/day: 1.50    Years: 50.00    Pack years: 75.00    Types: Cigarettes    Quit date: 04/30/2015    Years since quitting: 5.7   Smokeless tobacco: Never   Tobacco comments:    States he sometimes sneaks a cigarette  Vaping Use   Vaping Use: Never used  Substance and Sexual Activity   Alcohol use: Yes    Alcohol/week: 0.0 standard drinks    Comment: once yearly   Drug use: No   Sexual activity: Not on file     Physical Exam: Blood pressure 134/70, pulse 80, temperature 98.3 F (36.8 C), temperature source Oral, height 6\' 1"  (1.854 m), weight 217 lb 3.2 oz (98.5 kg), SpO2 97  %.  Gen:      No acute distress, chronically ill appearing on oxygen in wheelchair ENT:  no nasal polyps, mucus membranes moist Lungs:    diminished, No increased respiratory effort, symmetric chest wall excursion, clear to auscultation bilaterally, no wheezes or crackles CV:         Regular rate and rhythm; no murmurs, rubs, or gallops.  No pedal edema   Data Reviewed: Imaging: I have personally reviewed the CT Chest and PET scan which show LLL mass with obstruction and atelectasis and effusion.   PFTs:  PFT Results Latest Ref Rng & Units 05/02/2020 06/26/2015  FVC-Pre L 3.51 3.24  FVC-Predicted Pre % 73 65  FVC-Post L 3.72 3.85  FVC-Predicted Post % 77 77  Pre FEV1/FVC % % 52 55  Post FEV1/FCV % % 55 57  FEV1-Pre L 1.84 1.79  FEV1-Predicted Pre % 52 48  FEV1-Post L 2.04 2.19  DLCO uncorrected ml/min/mmHg 11.54 16.61  DLCO UNC% % 41 45  DLCO corrected ml/min/mmHg 11.54 17.01  DLCO COR %Predicted % 41 46  DLVA Predicted % 49 54  TLC L 7.84 7.51  TLC % Predicted % 102 98  RV % Predicted % 142 113   I have personally reviewed the patient's PFTs and they show moderately severe COPD  Labs:  LHC   Mid LM to Dist LM lesion is 50% stenosed.   Prox RCA to Mid RCA lesion is 25% stenosed.   The left ventricular systolic function is normal.   LV end diastolic pressure is normal.   The left ventricular ejection fraction is 55-65% by visual estimate.   Nonobstructive CAD. There is a 50% distal left main stenosis but this is not hemodynamically significant by flow wire assessment. Normal LV function Normal LVEDP   Plan: patient is cleared to proceed with evaluation and treatment of his lung mass. Risk factor modification.     Immunization status: Immunization History  Administered Date(s) Administered   Fluad Quad(high Dose 65+) 12/11/2020   Influenza Split 12/06/2012   Influenza Whole 11/25/2011   Influenza, High Dose Seasonal PF 11/18/2018   Influenza, Quadrivalent,  Recombinant, Inj, Pf 12/15/2016, 12/07/2017, 11/18/2018, 12/06/2019   Influenza,inj,Quad PF,6+ Mos 11/20/2014, 11/25/2015   Influenza-Unspecified 11/27/2011, 11/24/2017   PFIZER(Purple Top)SARS-COV-2 Vaccination 04/18/2019, 05/09/2019, 12/15/2019   Pneumococcal Conjugate-13 05/14/2015   Pneumococcal Polysaccharide-23 05/14/2007, 05/14/2010   Td 12/06/2019   Tdap 12/14/2007   Zoster, Live 05/14/2010    External Records Personally Reviewed: previous pulmonologist  Personally discussed his case with  Dr. Halford Chessman, Dr. 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  I do not think he needs to go to the hospital today. He will go to the ED over the weekend if he has worsening symptoms. Plan for urgent bronchoscopy in the next few days with Dr. Valeta Harms - scheduled for Monday Discussed risks and benefits.  He has agreed to proceed.   I spent 40 minutes in the care of this patient today including pre-charting, chart review, review of results, face-to-face care, coordination of care and communication with consultants etc.).  Return to Care: RTC with Dr. Halford Chessman following procedure.    Lenice Llamas, MD Pulmonary and [place]

## 2021-01-25 NOTE — Patient Instructions (Signed)
We will work on getting you scheduled for bronchoscopy.  Follow up with our office after procedure.   If you have worsening blood in your sputum over the weekend and it starts to increase beyond filling up a small cup, or you have trouble breathing beyond what is normal or your oxygen levels are dropping you need to go to the Emergency Department.

## 2021-01-25 NOTE — Progress Notes (Signed)
Brief office note:  I met with Mr. James [name] in the office to discuss bronchoscopy.  Had ongoing hemoptysis.  Has known lung mass as seen on recent pet imaging.  Saw at request of Dr. Shearon Stalls today in the office for acute visit.  I discussed undergoing bronchoscopy with tissue biopsy possible tumor debulking.  There is visible endobronchial tumor within the airway on imaging reviewed today in the office.  He has been coughing up blood for the past several days.  So far seems to be manageable that he is going to need to move forward with tissue biopsy he has already been seen and cleared by cardiology.  Tentative bronchoscopy date will be 01/28/2021 Orders placed. Discussed risk with patient today in the office.  Patient is agreeable to proceed.  I also instructed the patient that if his hemoptysis worsens over the weekend that he may need to come to the emergency department this weekend   If patient presents to the emergency department with worsening hemoptysis would consider treatment with nebulized TXA 500 mg x 1.   [place] [facility] 01/25/2021 10:10 AM

## 2021-01-25 NOTE — Progress Notes (Signed)
James [name]    [id number]    Sep 03, 1945  Primary Care Physician:Pharr, Thayer Jew, MD Date of Appointment: 01/25/2021 Established Patient Visit  Chief complaint:   Chief Complaint  Patient presents with   Acute Visit    Pt has been having worsening lung pain 6 weeks and states about a week ago he started having more problems with the hemoptysis. Pt recently had a cardiac cath performed. States his breathing has become worse.     HPI: James [name] is a 75 y.o. man with CAD. 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 Patient of Dr. Halford Chessman. He is not on any blood thinners.  Interval Updates: Here for sick visit for hemoptysis. 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 I have discussed his case with Dr. Halford Chessman his primary pulmonologist.   Last week before [facility] he could fill up a small cup. Now it has backed off and he hasn't had any in the last two days.   Pain is in his central chest and is waking him up at night. HE has it during the day as well, but more aware at night. Worse with exertion.   Was awaiting cardiac evaluation for   I have reviewed the patient's family social and past medical history and updated as appropriate.   Past Medical History:  Diagnosis Date   Allergy    seasonal   Blind left eye    legally   Blood transfusion    2002   COPD (chronic obstructive pulmonary disease) (HCC)    Depression    Diabetes mellitus, type 2 (HCC)    Diverticulosis    ED (erectile dysfunction)    Glaucoma    Hyperlipidemia    Hypertension    Insomnia    Internal and external bleeding hemorrhoids    Iron deficiency anemia    Osteoarthritis    Rosacea    Tremor of both hands    Tubular adenoma of colon 03/28/2011    Past Surgical History:  Procedure Laterality Date   ARTHROSCOPIC REPAIR ACL     [place]   right   COLONOSCOPY WITH PROPOFOL N/A 07/25/2015   Procedure: COLONOSCOPY WITH  PROPOFOL;  Surgeon: [name], MD;  [facility];  Service: Endoscopy;  Laterality: N/A;   ESOPHAGOGASTRODUODENOSCOPY (EGD) WITH PROPOFOL N/A 07/25/2015   Procedure: ESOPHAGOGASTRODUODENOSCOPY (EGD) WITH PROPOFOL;  Surgeon: [name], MD;  [facility];  Service: Endoscopy;  Laterality: N/A;   INTRAVASCULAR PRESSURE WIRE/FFR STUDY N/A 01/22/2021   Procedure: INTRAVASCULAR PRESSURE WIRE/FFR STUDY;  Surgeon: [name], Peter M, MD;  Location: [place] [facility];  Service: Cardiovascular;  Laterality: N/A;   IR THORACENTESIS ASP PLEURAL SPACE W/IMG GUIDE  09/17/2020   IR THORACENTESIS ASP PLEURAL SPACE W/IMG GUIDE  10/25/2020   KNEE ARTHROSCOPY  1996   LEFT HEART CATH AND CORONARY ANGIOGRAPHY N/A 01/22/2021   Procedure: LEFT HEART CATH AND CORONARY ANGIOGRAPHY;  Surgeon: [name], Peter M, MD;  Location: [place] [facility];  Service: Cardiovascular;  Laterality: N/A;   PARTIAL COLECTOMY  2008   THYROID CYST EXCISION     TONSILLECTOMY      Family History  Problem Relation Age of Onset   CAD Father    Alcohol abuse Father    Heart disease Father    COPD Mother    Asthma Mother    Emphysema Mother  Cancer Maternal Grandmother    Colon cancer Neg Hx     Social History   Occupational History   Occupation: retired    Fish farm manager: RETIRED  Tobacco Use   Smoking status: Former    Packs/day: 1.50    Years: 50.00    Pack years: 75.00    Types: Cigarettes    Quit date: 04/30/2015    Years since quitting: 5.7   Smokeless tobacco: Never   Tobacco comments:    States he sometimes sneaks a cigarette  Vaping Use   Vaping Use: Never used  Substance and Sexual Activity   Alcohol use: Yes    Alcohol/week: 0.0 standard drinks    Comment: once yearly   Drug use: No   Sexual activity: Not on file     Physical Exam: Blood pressure 134/70, pulse 80, temperature 98.3 F (36.8 C), temperature source Oral, height 6\' 1"  (1.854 m), weight 217 lb 3.2 oz (98.5 kg), SpO2 97  %.  Gen:      No acute distress, chronically ill appearing on oxygen in wheelchair ENT:  no nasal polyps, mucus membranes moist Lungs:    diminished, No increased respiratory effort, symmetric chest wall excursion, clear to auscultation bilaterally, no wheezes or crackles CV:         Regular rate and rhythm; no murmurs, rubs, or gallops.  No pedal edema   Data Reviewed: Imaging: I have personally reviewed the CT Chest and PET scan which show LLL mass with obstruction and atelectasis and effusion.   PFTs:  PFT Results Latest Ref Rng & Units 05/02/2020 06/26/2015  FVC-Pre L 3.51 3.24  FVC-Predicted Pre % 73 65  FVC-Post L 3.72 3.85  FVC-Predicted Post % 77 77  Pre FEV1/FVC % % 52 55  Post FEV1/FCV % % 55 57  FEV1-Pre L 1.84 1.79  FEV1-Predicted Pre % 52 48  FEV1-Post L 2.04 2.19  DLCO uncorrected ml/min/mmHg 11.54 16.61  DLCO UNC% % 41 45  DLCO corrected ml/min/mmHg 11.54 17.01  DLCO COR %Predicted % 41 46  DLVA Predicted % 49 54  TLC L 7.84 7.51  TLC % Predicted % 102 98  RV % Predicted % 142 113   I have personally reviewed the patient's PFTs and they show moderately severe COPD  Labs:  LHC   Mid LM to Dist LM lesion is 50% stenosed.   Prox RCA to Mid RCA lesion is 25% stenosed.   The left ventricular systolic function is normal.   LV end diastolic pressure is normal.   The left ventricular ejection fraction is 55-65% by visual estimate.   Nonobstructive CAD. There is a 50% distal left main stenosis but this is not hemodynamically significant by flow wire assessment. Normal LV function Normal LVEDP   Plan: patient is cleared to proceed with evaluation and treatment of his lung mass. Risk factor modification.     Immunization status: Immunization History  Administered Date(s) Administered   Fluad Quad(high Dose 65+) 12/11/2020   Influenza Split 12/06/2012   Influenza Whole 11/25/2011   Influenza, High Dose Seasonal PF 11/18/2018   Influenza, Quadrivalent,  Recombinant, Inj, Pf 12/15/2016, 12/07/2017, 11/18/2018, 12/06/2019   Influenza,inj,Quad PF,6+ Mos 11/20/2014, 11/25/2015   Influenza-Unspecified 11/27/2011, 11/24/2017   PFIZER(Purple Top)SARS-COV-2 Vaccination 04/18/2019, 05/09/2019, 12/15/2019   Pneumococcal Conjugate-13 05/14/2015   Pneumococcal Polysaccharide-23 05/14/2007, 05/14/2010   Td 12/06/2019   Tdap 12/14/2007   Zoster, Live 05/14/2010    External Records Personally Reviewed: previous pulmonologist  Personally discussed his case with  Dr. Halford Chessman, Dr. 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  I do not think he needs to go to the hospital today. He will go to the ED over the weekend if he has worsening symptoms. Plan for urgent bronchoscopy in the next few days with Dr. Valeta Harms - scheduled for Monday Discussed risks and benefits.  He has agreed to proceed.   I spent 40 minutes in the care of this patient today including pre-charting, chart review, review of results, face-to-face care, coordination of care and communication with consultants etc.).  Return to Care: RTC with Dr. Halford Chessman following procedure.    Lenice Llamas, MD Pulmonary and [place]

## 2021-01-25 NOTE — Progress Notes (Addendum)
I left a voice message on cell phone- which is Mrs. Cardell's cell phone.  I reviewed the chart and  asked anesthesiology PA-C to review.  I left instruction for Monday am.   Mrs. Leaf called me back  Mr. Launer has some shortness, wears oxygen 24/7 has to increase it to 6 liters when walking. Mr. Ybarra has type II diabetes, I instructed [name] that patient should not take Metformin the morning of surgery. I instructed patient to check CBG after awaking and every 2 hours until arrival  to the hospital.  I Instructed patient if CBG is less than 70 to take 4 Glucose Tablets or 1 tube of Glucose Gel or 1/2 cup of a clear juice. Recheck CBG in 15 minutes if CBG is not over 70 call, pre- op desk at [phone] for further instructions.   I instructed [name] that patient  needs to stop Vitamins and Ibuprofen.  I instructed patient to shower with antibiotic soap, if it is available.  Dry off with a clean towel. Do not put lotion, powder, cologne or deodorant or makeup.No jewelry or piercings. Men may shave their face and neck. Woman should not shave. No nail polish, artificial or acrylic nails. Wear clean clothes, brush your teeth. Glasses, contact lens,dentures or partials may not be worn in the OR. If you need to wear them, please bring a case for glasses, do not wear contacts or bring a case, the hospital does not have contact cases, dentures or partials will have to be removed , make sure they are clean, we will provide a denture cup to put them in. You will need some one to drive you home and a responsible person over the age of 73 to stay with you for the first 24 hours after surgery.

## 2021-01-26 LAB — SARS CORONAVIRUS 2 (TAT 6-24 HRS): SARS Coronavirus 2: NEGATIVE

## 2021-01-28 ENCOUNTER — Encounter (HOSPITAL_COMMUNITY): Payer: Self-pay | Admitting: Pulmonary Disease

## 2021-01-28 ENCOUNTER — Encounter (HOSPITAL_COMMUNITY)
Admission: RE | Disposition: A | Discharge status: Home / Self Care | Payer: Self-pay | Source: Home / Self Care | Attending: Pulmonary Disease

## 2021-01-28 ENCOUNTER — Ambulatory Visit (HOSPITAL_COMMUNITY)
Admission: RE | Admit: 2021-01-28 | Discharge: 2021-01-28 | Disposition: A | Discharge status: Home / Self Care | Payer: Medicare Other | Attending: Pulmonary Disease | Admitting: Pulmonary Disease

## 2021-01-28 ENCOUNTER — Other Ambulatory Visit: Payer: Self-pay

## 2021-01-28 ENCOUNTER — Ambulatory Visit (HOSPITAL_COMMUNITY): Payer: Medicare Other | Admitting: Physician Assistant

## 2021-01-28 DIAGNOSIS — E119 Type 2 diabetes mellitus without complications: Secondary | ICD-10-CM | POA: Diagnosis not present

## 2021-01-28 DIAGNOSIS — R918 Other nonspecific abnormal finding of lung field: Secondary | ICD-10-CM

## 2021-01-28 DIAGNOSIS — Z9981 Dependence on supplemental oxygen: Secondary | ICD-10-CM | POA: Diagnosis not present

## 2021-01-28 DIAGNOSIS — R042 Hemoptysis: Secondary | ICD-10-CM | POA: Insufficient documentation

## 2021-01-28 DIAGNOSIS — C3432 Malignant neoplasm of lower lobe, left bronchus or lung: Secondary | ICD-10-CM | POA: Diagnosis present

## 2021-01-28 DIAGNOSIS — C3492 Malignant neoplasm of unspecified part of left bronchus or lung: Secondary | ICD-10-CM | POA: Diagnosis not present

## 2021-01-28 DIAGNOSIS — I10 Essential (primary) hypertension: Secondary | ICD-10-CM | POA: Diagnosis not present

## 2021-01-28 DIAGNOSIS — R079 Chest pain, unspecified: Secondary | ICD-10-CM | POA: Diagnosis not present

## 2021-01-28 DIAGNOSIS — D509 Iron deficiency anemia, unspecified: Secondary | ICD-10-CM | POA: Diagnosis not present

## 2021-01-28 DIAGNOSIS — Z87891 Personal history of nicotine dependence: Secondary | ICD-10-CM | POA: Insufficient documentation

## 2021-01-28 DIAGNOSIS — I2510 Atherosclerotic heart disease of native coronary artery without angina pectoris: Secondary | ICD-10-CM | POA: Diagnosis not present

## 2021-01-28 DIAGNOSIS — J439 Emphysema, unspecified: Secondary | ICD-10-CM | POA: Insufficient documentation

## 2021-01-28 DIAGNOSIS — E785 Hyperlipidemia, unspecified: Secondary | ICD-10-CM | POA: Diagnosis not present

## 2021-01-28 DIAGNOSIS — C3402 Malignant neoplasm of left main bronchus: Secondary | ICD-10-CM | POA: Diagnosis not present

## 2021-01-28 HISTORY — PX: HEMOSTASIS CONTROL: SHX6838

## 2021-01-28 HISTORY — PX: VIDEO BRONCHOSCOPY: SHX5072

## 2021-01-28 HISTORY — PX: BALLOON DILATION: SHX5330

## 2021-01-28 HISTORY — PX: CRYOTHERAPY: SHX6894

## 2021-01-28 HISTORY — PX: BRONCHIAL BIOPSY: SHX5109

## 2021-01-28 HISTORY — DX: Dyspnea, unspecified: R06.00

## 2021-01-28 LAB — GLUCOSE, CAPILLARY
Glucose-Capillary: 146 mg/dL — ABNORMAL HIGH (ref 70–99)
Glucose-Capillary: 129 mg/dL — ABNORMAL HIGH (ref 70–99)

## 2021-01-28 SURGERY — VIDEO BRONCHOSCOPY WITHOUT FLUORO
Anesthesia: General | Laterality: Left

## 2021-01-28 MED ORDER — EPINEPHRINE 1 MG/10ML IJ SOSY
PREFILLED_SYRINGE | INTRAMUSCULAR | Status: AC
  Filled 2021-01-28: qty 10

## 2021-01-28 MED ORDER — PHENYLEPHRINE 40 MCG/ML (10ML) SYRINGE FOR IV PUSH (FOR BLOOD PRESSURE SUPPORT)
PREFILLED_SYRINGE | INTRAVENOUS | Status: DC | PRN
  Administered 2021-01-28 (×2): 120 ug via INTRAVENOUS

## 2021-01-28 MED ORDER — LACTATED RINGERS IV SOLN: INTRAVENOUS | Status: DC | PRN

## 2021-01-28 MED ORDER — FENTANYL CITRATE (PF) 100 MCG/2ML IJ SOLN
INTRAMUSCULAR | Status: DC | PRN
  Administered 2021-01-28: 100 ug via INTRAVENOUS

## 2021-01-28 MED ORDER — PROPOFOL 10 MG/ML IV BOLUS
INTRAVENOUS | Status: DC | PRN
  Administered 2021-01-28: 160 mg via INTRAVENOUS

## 2021-01-28 MED ORDER — FENTANYL CITRATE (PF) 100 MCG/2ML IJ SOLN: 25.0000 ug | INTRAMUSCULAR | Status: DC | PRN

## 2021-01-28 MED ORDER — ROCURONIUM BROMIDE 10 MG/ML (PF) SYRINGE
PREFILLED_SYRINGE | INTRAVENOUS | Status: DC | PRN
  Administered 2021-01-28: 50 mg via INTRAVENOUS
  Administered 2021-01-28: 10 mg via INTRAVENOUS

## 2021-01-28 MED ORDER — CHLORHEXIDINE GLUCONATE 0.12 % MT SOLN: 15.0000 mL | Freq: Once | OROMUCOSAL | Status: AC

## 2021-01-28 MED ORDER — DEXAMETHASONE SODIUM PHOSPHATE 10 MG/ML IJ SOLN
INTRAMUSCULAR | Status: DC | PRN
  Administered 2021-01-28: 10 mg via INTRAVENOUS

## 2021-01-28 MED ORDER — PHENYLEPHRINE HCL-NACL 20-0.9 MG/250ML-% IV SOLN
INTRAVENOUS | Status: DC | PRN
  Administered 2021-01-28: 65 ug/min via INTRAVENOUS

## 2021-01-28 MED ORDER — LIDOCAINE 2% (20 MG/ML) 5 ML SYRINGE
INTRAMUSCULAR | Status: DC | PRN
  Administered 2021-01-28: 100 mg via INTRAVENOUS

## 2021-01-28 MED ORDER — EPINEPHRINE PF 1 MG/ML IJ SOLN
INTRAMUSCULAR | Status: DC | PRN
  Administered 2021-01-28: 10 mL via ENDOTRACHEOPULMONARY

## 2021-01-28 MED ORDER — ONDANSETRON HCL 4 MG/2ML IJ SOLN
INTRAMUSCULAR | Status: DC | PRN
  Administered 2021-01-28: 4 mg via INTRAVENOUS

## 2021-01-28 MED ORDER — CHLORHEXIDINE GLUCONATE 0.12 % MT SOLN
OROMUCOSAL | Status: AC
  Administered 2021-01-28: 15 mL via OROMUCOSAL
  Filled 2021-01-28: qty 15

## 2021-01-28 MED ORDER — SUGAMMADEX SODIUM 200 MG/2ML IV SOLN
INTRAVENOUS | Status: DC | PRN
  Administered 2021-01-28: 200 mg via INTRAVENOUS

## 2021-01-28 SURGICAL SUPPLY — 29 items
BRUSH CYTOL CELLEBRITY 1.5X140 (MISCELLANEOUS) IMPLANT
CANISTER SUCT 3000ML PPV (MISCELLANEOUS) ×4 IMPLANT
CONT SPEC 4OZ CLIKSEAL STRL BL (MISCELLANEOUS) ×4 IMPLANT
COVER BACK TABLE 60X90IN (DRAPES) ×4 IMPLANT
COVER DOME SNAP 22 D (MISCELLANEOUS) ×4 IMPLANT
FORCEPS BIOP RJ4 1.8 (CUTTING FORCEPS) IMPLANT
GAUZE SPONGE 4X4 12PLY STRL (GAUZE/BANDAGES/DRESSINGS) ×4 IMPLANT
GLOVE BIO SURGEON STRL SZ7.5 (GLOVE) ×4 IMPLANT
GOWN STRL REUS W/ TWL LRG LVL3 (GOWN DISPOSABLE) ×3 IMPLANT
GOWN STRL REUS W/TWL LRG LVL3 (GOWN DISPOSABLE) ×4
KIT CLEAN ENDO COMPLIANCE (KITS) ×8 IMPLANT
KIT TURNOVER KIT B (KITS) ×4 IMPLANT
MARKER SKIN DUAL TIP RULER LAB (MISCELLANEOUS) ×4 IMPLANT
NEEDLE EBUS SONO TIP PENTAX (NEEDLE) ×4 IMPLANT
NS IRRIG 1000ML POUR BTL (IV SOLUTION) ×4 IMPLANT
OIL SILICONE PENTAX (PARTS (SERVICE/REPAIRS)) ×4 IMPLANT
PAD ARMBOARD 7.5X6 YLW CONV (MISCELLANEOUS) ×8 IMPLANT
SOL ANTI FOG 6CC (MISCELLANEOUS) ×3 IMPLANT
SOLUTION ANTI FOG 6CC (MISCELLANEOUS) ×1
SYR 20CC LL (SYRINGE) ×8 IMPLANT
SYR 20ML ECCENTRIC (SYRINGE) ×8 IMPLANT
SYR 50ML SLIP (SYRINGE) IMPLANT
SYR 5ML LUER SLIP (SYRINGE) ×4 IMPLANT
TOWEL OR 17X24 6PK STRL BLUE (TOWEL DISPOSABLE) ×4 IMPLANT
TRAP SPECIMEN MUCOUS 40CC (MISCELLANEOUS) IMPLANT
TUBE CONNECTING 20X1/4 (TUBING) ×8 IMPLANT
UNDERPAD 30X30 (UNDERPADS AND DIAPERS) ×4 IMPLANT
VALVE DISPOSABLE (MISCELLANEOUS) ×4 IMPLANT
WATER STERILE IRR 1000ML POUR (IV SOLUTION) ×4 IMPLANT

## 2021-01-28 NOTE — Progress Notes (Incomplete)
Radiation Oncology         [phone] ________________________________  Initial outpatient Consultation  Name: James [name] MRN: [id number]  Date of Service: 01/29/2021 DOB: 12/18/1945  FX:[id number], Thayer Jew, MD  [name], DO   REFERRING PHYSICIAN: [name], DO  DIAGNOSIS: There were no encounter diagnoses.  No diagnosis found.  HISTORY OF PRESENT ILLNESS: James [name] is a 75 y.o. male seen at the request of Dr. Valeta Harms. He initially presented with hemoptysis.  PREVIOUS RADIATION THERAPY: {EXAM; YES/NO:19492::"No"}  PAST MEDICAL HISTORY:  Past Medical History:  Diagnosis Date   Allergy    seasonal   Blind left eye    legally   Blood transfusion    2002   COPD (chronic obstructive pulmonary disease) (HCC)    Depression    Diabetes mellitus, type 2 (HCC)    Diverticulosis    Dyspnea    ED (erectile dysfunction)    Glaucoma    Hyperlipidemia    Hypertension    Insomnia    Internal and external bleeding hemorrhoids    Iron deficiency anemia    Osteoarthritis    Rosacea    Tremor of both hands    Tubular adenoma of colon 03/28/2011      PAST SURGICAL HISTORY: Past Surgical History:  Procedure Laterality Date   ARTHROSCOPIC REPAIR ACL     Singer   right   COLONOSCOPY WITH PROPOFOL N/A 07/25/2015   Procedure: COLONOSCOPY WITH PROPOFOL;  Surgeon: [name], MD;  Location: [facility];  Service: Endoscopy;  Laterality: N/A;   ESOPHAGOGASTRODUODENOSCOPY (EGD) WITH PROPOFOL N/A 07/25/2015   Procedure: ESOPHAGOGASTRODUODENOSCOPY (EGD) WITH PROPOFOL;  Surgeon: [name], MD;  Location: [facility];  Service: Endoscopy;  Laterality: N/A;   INTRAVASCULAR PRESSURE WIRE/FFR STUDY N/A 01/22/2021   Procedure: INTRAVASCULAR PRESSURE WIRE/FFR STUDY;  Surgeon: [name], Peter M, MD;  Location: [facility];  Service: Cardiovascular;  Laterality: N/A;   IR THORACENTESIS ASP PLEURAL SPACE W/IMG GUIDE   09/17/2020   IR THORACENTESIS ASP PLEURAL SPACE W/IMG GUIDE  10/25/2020   KNEE ARTHROSCOPY  1996   LEFT HEART CATH AND CORONARY ANGIOGRAPHY N/A 01/22/2021   Procedure: LEFT HEART CATH AND CORONARY ANGIOGRAPHY;  Surgeon: [name], Peter M, MD;  Location: [place];  Service: Cardiovascular;  Laterality: N/A;   PARTIAL COLECTOMY  2008   THYROID CYST EXCISION     TONSILLECTOMY      FAMILY HISTORY:  Family History  Problem Relation Age of Onset   CAD Father    Alcohol abuse Father    Heart disease Father    COPD Mother    Asthma Mother    Emphysema Mother    Cancer Maternal Grandmother    Colon cancer Neg Hx     SOCIAL HISTORY:  Social History   Socioeconomic History   Marital status: Married    Spouse name: Not on file   Number of children: 1   Years of education: HS   Highest education level: Not on file  Occupational History   Occupation: retired    Fish farm manager: RETIRED  Tobacco Use   Smoking status: Former    Packs/day: 1.50    Years: 50.00    Pack years: 75.00    Types: Cigarettes    Quit date: 04/30/2015    Years since quitting: 5.7   Smokeless tobacco: Never   Tobacco comments:    States he sometimes  sneaks a cigarette  Vaping Use   Vaping Use: Never used  Substance and Sexual Activity   Alcohol use: Not Currently    Comment: once yearly   Drug use: No   Sexual activity: Not on file  Other Topics Concern   Not on file  Social History Narrative   Drinks 1-2 Pepsi a day    Social Determinants of Health   Financial Resource Strain: Not on file  Food Insecurity: Not on file  Transportation Needs: Not on file  Physical Activity: Not on file  Stress: Not on file  Social Connections: Not on file  Intimate Partner Violence: Not on file    ALLERGIES: Aspirin, Codeine, and Morphine  MEDICATIONS:  Current Outpatient Medications  Medication Sig Dispense Refill   atorvastatin (LIPITOR) 20 MG tablet Take 20 mg by mouth at bedtime.      buPROPion  (WELLBUTRIN XL) 300 MG 24 hr tablet Take 150 mg by mouth 2 (two) times daily.     calcium-vitamin D (OSCAL WITH D) 250-125 MG-UNIT per tablet Take 1 tablet by mouth daily.     carbidopa-levodopa (SINEMET IR) 25-100 MG tablet Take 1.5 tablets by mouth 3 (three) times daily. 405 tablet 3   Carbidopa-Levodopa ER (SINEMET CR) 25-100 MG tablet controlled release Take 1 tablet by mouth 2 (two) times daily. 180 tablet 3   cetirizine (ZYRTEC) 10 MG tablet Take 10 mg by mouth 2 (two) times daily.     donepezil (ARICEPT) 10 MG tablet Take 1 tablet (10 mg total) by mouth at bedtime. 90 tablet 3   ferrous gluconate (FERGON) 324 MG tablet TAKE 1 TABLET BY MOUTH DAILY WITH BREAKFAST 90 tablet 3   formoterol (PERFOROMIST) 20 MCG/2ML nebulizer solution Take 2 mLs (20 mcg total) by nebulization 2 (two) times daily. (Patient taking differently: Take 20 mcg by nebulization daily as needed (cough).) 120 mL 5   Green Tea 315 MG CAPS Take 315 mg by mouth daily.     hydrochlorothiazide (HYDRODIURIL) 25 MG tablet Take 25 mg by mouth daily.     HYDROcodone bit-homatropine (HYCODAN) 5-1.5 MG/5ML syrup Take 5 mLs by mouth every 6 (six) hours as needed for cough. 240 mL 0   ibuprofen (ADVIL) 800 MG tablet Take 1 tablet (800 mg total) by mouth every 8 (eight) hours as needed for moderate pain. 30 tablet 1   isosorbide mononitrate (IMDUR) 60 MG 24 hr tablet Take 1 tablet (60 mg total) by mouth daily. (Patient taking differently: Take 20 mg by mouth daily.) 30 tablet 3   MEGARED OMEGA-3 KRILL OIL PO Take 1 capsule by mouth daily.     metFORMIN (GLUCOPHAGE) 500 MG tablet Take 1 tablet (500 mg total) by mouth 2 (two) times daily with a meal.     mirtazapine (REMERON) 15 MG tablet Take 15 mg by mouth at bedtime.     modafinil (PROVIGIL) 100 MG tablet Take 1 tablet (100 mg total) by mouth daily. 30 tablet 5   Multiple Vitamins-Minerals (MULTIVITAMIN WITH MINERALS) tablet Take 1 tablet by mouth daily. Centrum     nitroGLYCERIN  (NITROSTAT) 0.4 MG SL tablet Place 1 tablet (0.4 mg total) under the tongue every 5 (five) minutes as needed for chest pain. 25 tablet 3   omeprazole (PRILOSEC OTC) 20 MG tablet Take 20 mg by mouth daily with breakfast.     OXYGEN Inhale 4-6 L into the lungs continuous.     revefenacin (YUPELRI) 175 MCG/3ML nebulizer solution Take 3 mLs (175 mcg  total) by nebulization daily. 90 mL 5   sertraline (ZOLOFT) 100 MG tablet Take 150 mg by mouth daily.     traMADol (ULTRAM) 50 MG tablet Take 1 tablet (50 mg total) by mouth every 12 (twelve) hours as needed for moderate pain. 40 tablet 0   No current facility-administered medications for this encounter.    REVIEW OF SYSTEMS:  On review of systems, the patient reports that *** is doing well overall. *** denies any chest pain, shortness of breath, cough, fevers, chills, night sweats, unintended weight changes. *** denies any bowel or bladder disturbances, and denies abdominal pain, nausea or vomiting. *** denies any new musculoskeletal or joint aches or pains. A complete review of systems is obtained and is otherwise negative.    PHYSICAL EXAM:  Wt Readings from Last 3 Encounters:  01/28/21 208 lb (94.3 kg)  01/25/21 217 lb 3.2 oz (98.5 kg)  01/22/21 208 lb (94.3 kg)   Temp Readings from Last 3 Encounters:  01/28/21 97.7 F (36.5 C)  01/25/21 98.3 F (36.8 C) (Oral)  01/22/21 98.2 F (36.8 C) (Oral)   BP Readings from Last 3 Encounters:  01/28/21 131/65  01/25/21 134/70  01/22/21 (!) 131/55   Pulse Readings from Last 3 Encounters:  01/28/21 72  01/25/21 80  01/22/21 65    /10  In general this is a well appearing *** in no acute distress. *** is alert and oriented x4 and appropriate throughout the examination. HEENT reveals that the patient is normocephalic, atraumatic. EOMs are intact. PERRLA. Skin is intact without any evidence of gross lesions. Cardiovascular exam reveals a regular rate and rhythm, no clicks rubs or murmurs are  auscultated. Chest is clear to auscultation bilaterally. Lymphatic assessment is performed and does not reveal any adenopathy in the cervical, supraclavicular, axillary, or inguinal chains. Abdomen has active bowel sounds in all quadrants and is intact. The abdomen is soft, non tender, non distended. Lower extremities are negative for pretibial pitting edema, deep calf tenderness, cyanosis or clubbing.   KPS = ***  100 - Normal; no complaints; no evidence of disease. 90   - Able to carry on normal activity; minor signs or symptoms of disease. 80   - Normal activity with effort; some signs or symptoms of disease. 28   - Cares for self; unable to carry on normal activity or to do active work. 60   - Requires occasional assistance, but is able to care for most of his personal needs. 50   - Requires considerable assistance and frequent medical care. 37   - Disabled; requires special care and assistance. 21   - Severely disabled; hospital admission is indicated although death not imminent. 55   - Very sick; hospital admission necessary; active supportive treatment necessary. 10   - Moribund; fatal processes progressing rapidly. 0     - Dead  Karnofsky DA, Abelmann [place], Craver LS and Burchenal [facility] [phone]) The use of the nitrogen mustards in the palliative treatment of carcinoma: with particular reference to bronchogenic carcinoma Cancer 1 634-56  LABORATORY DATA:  Lab Results  Component Value Date   WBC 6.8 01/15/2021   HGB 8.4 (L) 01/15/2021   HCT 29.3 (L) 01/15/2021   MCV 71 (L) 01/15/2021   PLT 407 01/15/2021   Lab Results  Component Value Date   NA 149 (H) 01/15/2021   K 3.9 01/15/2021   CL 104 01/15/2021   CO2 29 01/15/2021   No results found for: ALT, AST, GGT, ALKPHOS,  BILITOT   RADIOGRAPHY: CARDIAC CATHETERIZATION  Result Date: 01/22/2021   Mid LM to Dist LM lesion is 50% stenosed.   Prox RCA to Mid RCA lesion is 25% stenosed.   The left ventricular systolic function is normal.    LV end diastolic pressure is normal.   The left ventricular ejection fraction is 55-65% by visual estimate. Nonobstructive CAD. There is a 50% distal left main stenosis but this is not hemodynamically significant by flow wire assessment. Normal LV function Normal LVEDP Plan: patient is cleared to proceed with evaluation and treatment of his lung mass. Risk factor modification.      IMPRESSION/PLAN: 1. 75 y.o. ***    I personally spent *** minutes in this encounter including chart review, reviewing radiological studies, meeting face-to-face with the patient, entering orders and completing documentation.    Nicholos Johns, PA-C    Tyler Pita, MD  [facility] Direct Dial: [phone]   Fax: [phone] [email or web address]   Skype   [email or web address]   This document serves as a record of services personally performed by Tyler Pita, MD and Freeman Caldron, PA-C. It was created on their behalf by Wilburn Mylar, a trained medical scribe. The creation of this record is based on the scribe's personal observations and the provider's statements to them. This document has been checked and approved by the attending provider.

## 2021-01-28 NOTE — Transfer of Care (Signed)
Immediate Anesthesia Transfer of Care Note  Patient: James [name]  Procedure(s) Performed: VIDEO BRONCHOSCOPY WITHOUT FLUORO (Left) CRYOTHERAPY  Patient Location: PACU  Anesthesia Type:General  Level of Consciousness: drowsy, patient cooperative and responds to stimulation  Airway & Oxygen Therapy: Patient Spontanous Breathing and Patient connected to nasal cannula oxygen  Post-op Assessment: Report given to RN and Post -op Vital signs reviewed and stable  Post vital signs: Reviewed and stable  Last Vitals:  Vitals Value Taken Time  BP 141/63 01/28/21 0847  Temp    Pulse 79 01/28/21 0847  Resp 19 01/28/21 0847  SpO2 96 % 01/28/21 0847  Vitals shown include unvalidated device data.  Last Pain:  Vitals:   01/28/21 0634  TempSrc:   PainSc: 2       Patients Stated Pain Goal: 0 (67/56/12 5483)  Complications: No notable events documented.

## 2021-01-28 NOTE — Interval H&P Note (Signed)
History and Physical Interval Note:  01/28/2021 7:03 AM  James [name]  has presented today for surgery, with the diagnosis of Lung mass.  The various methods of treatment have been discussed with the patient and family. After consideration of risks, benefits and other options for treatment, the patient has consented to  Procedure(s) with comments: VIDEO BRONCHOSCOPY WITHOUT FLUORO (Left) - w/ cryotherapy VIDEO BRONCHOSCOPY WITH ENDOBRONCHIAL ULTRASOUND (Bilateral) as a surgical intervention.  The patient's history has been reviewed, patient examined, no change in status, stable for surgery.  I have reviewed the patient's chart and labs.  Questions were answered to the patient's satisfaction.     [place]

## 2021-01-28 NOTE — Discharge Instructions (Signed)
Flexible Bronchoscopy, Care After This sheet gives you information about how to care for yourself after your test. Your doctor may also give you more specific instructions. If you have problems or questions, contact your doctor. Follow these instructions at home: Eating and drinking Do not eat or drink anything (not even water) for 2 hours after your test, or until your numbing medicine (local anesthetic) wears off. When your numbness is gone and your cough and gag reflexes have come back, you may: Eat only soft foods. Slowly drink liquids. The day after the test, go back to your normal diet. Driving Do not drive for 24 hours if you were given a medicine to help you relax (sedative). Do not drive or use heavy machinery while taking prescription pain medicine. General instructions  Take over-the-counter and prescription medicines only as told by your doctor. Return to your normal activities as told. Ask what activities are safe for you. Do not use any products that have nicotine or tobacco in them. This includes cigarettes and e-cigarettes. If you need help quitting, ask your doctor. Keep all follow-up visits as told by your doctor. This is important. It is very important if you had a tissue sample (biopsy) taken. Get help right away if: You have shortness of breath that gets worse. You get light-headed. You feel like you are going to pass out (faint). You have chest pain. You cough up: More than a little blood. More blood than before. Summary Do not eat or drink anything (not even water) for 2 hours after your test, or until your numbing medicine wears off. Do not use cigarettes. Do not use e-cigarettes. Get help right away if you have chest pain.  This information is not intended to replace advice given to you by your health care provider. Make sure you discuss any questions you have with your health care provider. Document Released: 12/08/2008 Document Revised: 01/23/2017 Document  Reviewed: 02/29/2016 Elsevier Patient Education  2020 [employer].

## 2021-01-28 NOTE — Op Note (Signed)
Video Bronchoscopy Procedure Note with endobronchial cryobiopsies, cryotherapy, balloon dilatation, lesional excision, relief of obstruction.  Date of Operation: 01/28/2021  Pre-op Diagnosis: Lung mass  Post-op Diagnosis: Lung mass   Surgeon: [name], DO   Assistants: none  Anesthesia: General   Operation: Flexible video fiberoptic bronchoscopy and biopsies.  Estimated Blood Loss: ~5 cc  Complications: none noted  Indications and History: James [name] is 75 y.o. with history of left lung mass, hemoptysis.  Recommendation was to perform video fiberoptic bronchoscopy with biopsies. The risks, benefits, complications, treatment options and expected outcomes were discussed with the patient.  The possibilities of pneumothorax, pneumonia, reaction to medication, pulmonary aspiration, perforation of a viscus, bleeding, failure to diagnose a condition and creating a complication requiring transfusion or operation were discussed with the patient who freely signed the consent.    Description of Procedure: The patient was seen in the Preoperative Area, was examined and was deemed appropriate to proceed.  The patient was taken to endoscopy room 1, identified as James [name] and the procedure verified as Flexible Video Fiberoptic Bronchoscopy.  A Time Out was held and the above information confirmed.   Standard therapeutic bronchoscope was inserted into the patient's airway for examination.  Normal right lung anatomy with no evidence of endobronchial disease.  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  See images below.  Using the 2.4 mm cryotherapy probe we completed endobronchial cryobiopsies, tumor debulking, lesional excision and coring out of the left mainstem tumor up into the left upper lobe segments.  We were able to free the airway for visualization of the left upper lobe.  Due to the amount of tumor involving the left lower lobe I was unable to  define any tissue planes that resembled the opening of the left lower lobe.  We completed freeze thaw cycles to the walls of the remaining tumor.  We used a pulmonary balloon to dilate the remaining opening that tapered up into the left upper lobe.  We used epinephrine, diluted 1: 10,000 solution for hemostasis.  We also irrigated the left mainstem with iced saline.  At the termination of the procedure there was no evidence of active bleeding there was some oozing from the tumor base. Using the standard therapeutic scope we had to aspirate both mainstem's to remove any remaining blood clots.  All distal subsegments were patent at the termination of the procedure. Patient tolerated the procedure well.  The bronchoscope was brought to just above the main carina there was no evidence of active bleeding and the scope was removed from the patient's airway.  Samples: 1.  Endobronchial cryobiopsies from the left mainstem.  Plans:  We will review the cytology, pathology results with the patient when they become available.  Outpatient followup will be with Octavio Graves Mercedes Valeriano, DO     LEFT MAIN STEM TUMOR   LEFT UPPER LOBE SUBSEGMENTS ON THE OTHER SIDE OF OBSTRUCTED TUMOR      [name], DO [facility] 01/28/2021 8:44 AM

## 2021-01-28 NOTE — Anesthesia Preprocedure Evaluation (Addendum)
Anesthesia Evaluation  Patient identified by MRN, date of birth, ID band Patient awake    Reviewed: Allergy & Precautions, NPO status , Patient's Chart, lab work & pertinent test results  Airway Mallampati: II  TM Distance: >3 FB     Dental   Pulmonary shortness of breath, COPD, former smoker,    breath sounds clear to auscultation       Cardiovascular hypertension,  Rhythm:Regular Rate:Normal     Neuro/Psych    GI/Hepatic negative GI ROS, Neg liver ROS,   Endo/Other  diabetes  Renal/GU      Musculoskeletal  (+) Arthritis ,   Abdominal   Peds  Hematology   Anesthesia Other Findings   Reproductive/Obstetrics                             Anesthesia Physical Anesthesia Plan  ASA: 3  Anesthesia Plan: General   Post-op Pain Management:    Induction: Intravenous  PONV Risk Score and Plan: 2 and Ondansetron  Airway Management Planned: Oral ETT  Additional Equipment:   Intra-op Plan:   Post-operative Plan:   Informed Consent: I have reviewed the patients History and Physical, chart, labs and discussed the procedure including the risks, benefits and alternatives for the proposed anesthesia with the patient or authorized representative who has indicated his/her understanding and acceptance.     Dental advisory given  Plan Discussed with: CRNA and Anesthesiologist  Anesthesia Plan Comments:        Anesthesia Quick Evaluation

## 2021-01-28 NOTE — Progress Notes (Signed)
Thoracic Location of Tumor / Histology: Lung Mass  Patient presented to [facility] office 01/25/2021, Hemoptysis for several days.   Biopsies:   01/25/2021 Dr. 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  He has been coughing up blood for the past several days.  So far seems to be manageable that he is going to need to move forward with tissue biopsy he has already been seen and cleared by cardiology.  Tentative bronchoscopy date will be 01/28/2021  Tobacco/Marijuana/Snuff/ETOH use: On file quit smoking 04/30/2015, may occasionally sneak a cigarette, never use smokeless tobacco, and yes to alcohol.  Past/Anticipated interventions by cardiothoracic surgery, if any:   Past/Anticipated interventions by medical oncology, if any:   Signs/Symptoms Weight changes, if any: He has gained some weight within the past few weeks. Respiratory complaints, if any: Wife reports he has SOB all the time, worse with increased activities. Hemoptysis, if any: Wife reports productive cough with phlegm and some blood. Pain issues, if any: Reports chest pain all the time.    SAFETY ISSUES: Prior radiation? No Pacemaker/ICD? No  Possible current pregnancy? N/a Is the patient on methotrexate? No  Current Complaints / other details:    -Oxygen 4 Liters, titrates based on activity.  -Receiving iron infusions with Dr. Lindi Adie

## 2021-01-28 NOTE — Anesthesia Postprocedure Evaluation (Signed)
Anesthesia Post Note  Patient: James [name]  Procedure(s) Performed: VIDEO BRONCHOSCOPY WITHOUT FLUORO (Left) CRYOTHERAPY     Patient location during evaluation: PACU Anesthesia Type: General Level of consciousness: awake Pain management: pain level controlled Vital Signs Assessment: post-procedure vital signs reviewed and stable Respiratory status: spontaneous breathing Cardiovascular status: stable Postop Assessment: no apparent nausea or vomiting Anesthetic complications: no   No notable events documented.  Last Vitals:  Vitals:   01/28/21 0905 01/28/21 0917  BP: 131/60 131/65  Pulse: 70 72  Resp: 17 14  Temp:  36.5 C  SpO2: 93% 98%    Last Pain:  Vitals:   01/28/21 0917  TempSrc:   PainSc: 2                  Dusti Tetro

## 2021-01-28 NOTE — Anesthesia Procedure Notes (Signed)
Procedure Name: Intubation Date/Time: 01/28/2021 7:40 AM Performed by: Janace Litten, CRNA Pre-anesthesia Checklist: Patient identified, Emergency Drugs available, Suction available and Patient being monitored Patient Re-evaluated:Patient Re-evaluated prior to induction Oxygen Delivery Method: Circle System Utilized Preoxygenation: Pre-oxygenation with 100% oxygen Induction Type: IV induction Ventilation: Mask ventilation without difficulty Laryngoscope Size: Mac and 4 Grade View: Grade I Tube type: Oral Tube size: 8.5 mm Number of attempts: 1 Airway Equipment and Method: Stylet Placement Confirmation: ETT inserted through vocal cords under direct vision, positive ETCO2 and breath sounds checked- equal and bilateral Secured at: 23 cm Tube secured with: Tape Dental Injury: Teeth and Oropharynx as per pre-operative assessment

## 2021-01-29 ENCOUNTER — Encounter: Payer: Self-pay | Admitting: Radiation Oncology

## 2021-01-29 ENCOUNTER — Telehealth: Payer: Self-pay | Admitting: Internal Medicine

## 2021-01-29 ENCOUNTER — Ambulatory Visit
Admission: RE | Admit: 2021-01-29 | Discharge: 2021-01-29 | Disposition: A | Discharge status: Home / Self Care | Payer: Medicare Other | Source: Ambulatory Visit | Attending: Radiation Oncology | Admitting: Radiation Oncology

## 2021-01-29 ENCOUNTER — Other Ambulatory Visit: Payer: Self-pay

## 2021-01-29 ENCOUNTER — Encounter: Payer: Self-pay | Admitting: *Deleted

## 2021-01-29 VITALS — Ht 73.0 in | Wt 214.0 lb

## 2021-01-29 DIAGNOSIS — C3432 Malignant neoplasm of lower lobe, left bronchus or lung: Secondary | ICD-10-CM | POA: Diagnosis not present

## 2021-01-29 DIAGNOSIS — Z87891 Personal history of nicotine dependence: Secondary | ICD-10-CM | POA: Diagnosis not present

## 2021-01-29 LAB — SURGICAL PATHOLOGY

## 2021-01-29 NOTE — Addendum Note (Signed)
Encounter addended by: Hayden Pedro, PA-C on: 01/29/2021 11:46 AM  Actions taken: Clinical Note Signed

## 2021-01-29 NOTE — Progress Notes (Signed)
Addendum to signed note dated today.   ROS: On review of systems, the patient's wife states that the patient has had a diagnosis of parkinson's disease for about 8 years now. He does have bilateral upper extremity tremor. He has also been on Oxygen at home at night for 5 years, but in the last year and a half he's been on continuous Oxygen at 4L [place]. He does have some difficulty with shortness of breath at baseline but especially with exertion. He also has had a productive cough with clear mucous but on multiple occasions has had hemoptysis reports that is like a tinged mucous. Since his bronchoscopy he noted to his wife his breathing had improved and his chest discomfort was less. Similarly he has had about 50 pounds of weight loss in the last year, but is slowly gaining some of this back. He is tired, and limited in getting out of the house with his physical condition. No other complaints are verbalized.     Carola Rhine, PAC

## 2021-01-29 NOTE — Telephone Encounter (Signed)
Scheduled appt per 12/5 referral. Spoke to pt's wife who was driving. She said she will call back to confirm appt.

## 2021-01-29 NOTE — Progress Notes (Signed)
Radiation Oncology         (336) [phone] ________________________________  Initial Outpatient Consultation - Conducted via telephone due to current COVID-19 concerns for limiting patient exposure  I spoke with the patient to conduct this consult visit via telephone to spare the patient unnecessary potential exposure in the healthcare setting during the current COVID-19 pandemic. The patient was notified in advance and was offered a [place] meeting to allow for face to face communication but unfortunately reported that they did not have the appropriate resources/technology to support such a visit and instead preferred to proceed with a telephone consult.   Name: James [name]        MRN: [id number]  Date of Service: 01/29/2021 DOB: 06/28/1945  RX:[id number], Thayer Jew, MD  [name], DO     REFERRING PHYSICIAN: [name], DO   DIAGNOSIS: The encounter diagnosis was Primary cancer of left lower lobe of lung ([place]).   HISTORY OF PRESENT ILLNESS: James [name] is a 75 y.o. male seen at the request of Dr. Valeta Harms for probable lung cancer.  The patient has a history of COPD and is followed by Dr. Llana Aliment at [facility].  He has been followed for many years with lung nodules, in 2016 he had a 1.5 cm nodule in the right lower lobe and this was followed and continued to grow to 2 cm in April 2017.  It was not PET avid in April 2017.  It was followed and stable in 2018, 2019, and March 2022.  Imaging of the chest with CT on 11/17/20 showed a combination of collapse and consolidation throughout the left lung in the central aspect of the left lower lobe with masslike area and abrupt cut off of the left lower lobe bronchus concerning for potential neoplasm.  Persistent architectural distortion was noted in the right lower lobe that was less solid in appearance from prior exam in March 2022.  Other nodules within the lung including a 3 mm right lower lobe pulmonary nodule which is considered stable.   He had stigmata of emphysematous disease atherosclerotic disease and possible hydronephrosis.  Of note he had had a left pleural effusion over the summer that was withdrawn via thoracentesis and no malignancy was seen on the report from 09/18/2020, this visit performed again on 10/25/2020 and atypical cells were present favored to be reactive mesothelial cells.  He followed back up in the pulmonary clinic and a PET scan on 12/26/2020 showed marked hypermetabolism with an SUV max of 17.5 in the central left lower lobe mass hypermetabolism extended into the left hilum.  CT imaging showed extensive debris in the left mainstem bronchus and airways to the lingula and left lower lobe with associated left lower lobe collapse posteriorly also showing low-level hypermetabolism.      No evidence of mediastinal or hilar adenopathy on the right side were noted.  There was some focal hypermetabolism in the left pelvis in the region of the left distal ureter without hydronephrosis but an 8 mm calcification projects into the expected location of the left distal ureter consistent with ureteral stone.  A subcutaneous nodule in the left paramidline lower anterior abdominal wall was noted but not hypermetabolic.  Incidentally bilateral renal cysts were identified, a tiny nonobstructing stone in the  lower pole of the left kidney.  There was a small focus of hypermetabolism anterior to the right femoral neck with SUV max of 5.1 without bony or soft tissue lesion.    He was evaluated  by Dr. Halford Chessman and he recommended evaluation with Dr. Valeta Harms for navigational bronchoscopy.  This was performed yesterday.    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  Cytology is pending.    His breathing was subjectively much better over night after the procedure.  He is seen today to discuss treatment recommendations of his cancer.    PREVIOUS  RADIATION THERAPY: No   PAST MEDICAL HISTORY:  Past Medical History:  Diagnosis Date   Allergy    seasonal   Blind left eye    legally   Blood transfusion    2002   COPD (chronic obstructive pulmonary disease) (HCC)    Depression    Diabetes mellitus, type 2 (HCC)    Diverticulosis    Dyspnea    ED (erectile dysfunction)    Glaucoma    Hyperlipidemia    Hypertension    Insomnia    Internal and external bleeding hemorrhoids    Iron deficiency anemia    Osteoarthritis    Rosacea    Tremor of both hands    Tubular adenoma of colon 03/28/2011       PAST SURGICAL HISTORY: Past Surgical History:  Procedure Laterality Date   ARTHROSCOPIC REPAIR ACL     [place]   right   COLONOSCOPY WITH PROPOFOL N/A 07/25/2015   Procedure: COLONOSCOPY WITH PROPOFOL;  Surgeon: [name], MD;  Location: [facility];  Service: Endoscopy;  Laterality: N/A;   ESOPHAGOGASTRODUODENOSCOPY (EGD) WITH PROPOFOL N/A 07/25/2015   Procedure: ESOPHAGOGASTRODUODENOSCOPY (EGD) WITH PROPOFOL;  Surgeon: [name], MD;  Location: [facility];  Service: Endoscopy;  Laterality: N/A;   INTRAVASCULAR PRESSURE WIRE/FFR STUDY N/A 01/22/2021   Procedure: INTRAVASCULAR PRESSURE WIRE/FFR STUDY;  Surgeon: [name], Peter M, MD;  Location: [facility];  Service: Cardiovascular;  Laterality: N/A;   IR THORACENTESIS ASP PLEURAL SPACE W/IMG GUIDE  09/17/2020   IR THORACENTESIS ASP PLEURAL SPACE W/IMG GUIDE  10/25/2020   KNEE ARTHROSCOPY  1996   LEFT HEART CATH AND CORONARY ANGIOGRAPHY N/A 01/22/2021   Procedure: LEFT HEART CATH AND CORONARY ANGIOGRAPHY;  Surgeon: [name], Peter M, MD;  Location: [facility];  Service: Cardiovascular;  Laterality: N/A;   PARTIAL COLECTOMY  2008   THYROID CYST EXCISION     TONSILLECTOMY       FAMILY HISTORY:  Family History  Problem Relation Age of Onset   CAD Father    Alcohol abuse Father    Heart disease Father     COPD Mother    Asthma Mother    Emphysema Mother    Cancer Maternal Grandmother    Colon cancer Neg Hx      SOCIAL HISTORY:  reports that he quit smoking about 5 years ago. His smoking use included cigarettes. He has a 75.00 pack-year smoking history. He has never used smokeless tobacco. He reports that he does not currently use alcohol. He reports that he does not use drugs.   ALLERGIES: Aspirin, Codeine, and Morphine   MEDICATIONS:  Current Outpatient Medications  Medication Sig Dispense Refill   atorvastatin (LIPITOR) 20 MG tablet Take 20 mg by mouth at bedtime.      buPROPion (WELLBUTRIN XL) 300 MG 24 hr tablet Take 150 mg by mouth 2 (two) times daily.     calcium-vitamin D (OSCAL  WITH D) 250-125 MG-UNIT per tablet Take 1 tablet by mouth daily.     carbidopa-levodopa (SINEMET IR) 25-100 MG tablet Take 1.5 tablets by mouth 3 (three) times daily. 405 tablet 3   Carbidopa-Levodopa ER (SINEMET CR) 25-100 MG tablet controlled release Take 1 tablet by mouth 2 (two) times daily. 180 tablet 3   cetirizine (ZYRTEC) 10 MG tablet Take 10 mg by mouth 2 (two) times daily.     donepezil (ARICEPT) 10 MG tablet Take 1 tablet (10 mg total) by mouth at bedtime. 90 tablet 3   ferrous gluconate (FERGON) 324 MG tablet TAKE 1 TABLET BY MOUTH DAILY WITH BREAKFAST 90 tablet 3   formoterol (PERFOROMIST) 20 MCG/2ML nebulizer solution Take 2 mLs (20 mcg total) by nebulization 2 (two) times daily. (Patient taking differently: Take 20 mcg by nebulization daily as needed (cough).) 120 mL 5   Green Tea 315 MG CAPS Take 315 mg by mouth daily.     hydrochlorothiazide (HYDRODIURIL) 25 MG tablet Take 25 mg by mouth daily.     HYDROcodone bit-homatropine (HYCODAN) 5-1.5 MG/5ML syrup Take 5 mLs by mouth every 6 (six) hours as needed for cough. 240 mL 0   ibuprofen (ADVIL) 800 MG tablet Take 1 tablet (800 mg total) by mouth every 8 (eight) hours as needed for moderate pain. 30 tablet 1   isosorbide mononitrate (IMDUR)  60 MG 24 hr tablet Take 1 tablet (60 mg total) by mouth daily. (Patient taking differently: Take 20 mg by mouth daily.) 30 tablet 3   MEGARED OMEGA-3 KRILL OIL PO Take 1 capsule by mouth daily.     metFORMIN (GLUCOPHAGE) 500 MG tablet Take 1 tablet (500 mg total) by mouth 2 (two) times daily with a meal.     mirtazapine (REMERON) 15 MG tablet Take 15 mg by mouth at bedtime.     modafinil (PROVIGIL) 100 MG tablet Take 1 tablet (100 mg total) by mouth daily. 30 tablet 5   Multiple Vitamins-Minerals (MULTIVITAMIN WITH MINERALS) tablet Take 1 tablet by mouth daily. Centrum     nitroGLYCERIN (NITROSTAT) 0.4 MG SL tablet Place 1 tablet (0.4 mg total) under the tongue every 5 (five) minutes as needed for chest pain. 25 tablet 3   omeprazole (PRILOSEC OTC) 20 MG tablet Take 20 mg by mouth daily with breakfast.     OXYGEN Inhale 4-6 L into the lungs continuous.     revefenacin (YUPELRI) 175 MCG/3ML nebulizer solution Take 3 mLs (175 mcg total) by nebulization daily. 90 mL 5   sertraline (ZOLOFT) 100 MG tablet Take 150 mg by mouth daily.     traMADol (ULTRAM) 50 MG tablet Take 1 tablet (50 mg total) by mouth every 12 (twelve) hours as needed for moderate pain. 40 tablet 0   No current facility-administered medications for this encounter.     REVIEW OF SYSTEMS: On review of systems, the patient's wife is on the call     PHYSICAL EXAM:  Wt Readings from Last 3 Encounters:  01/28/21 208 lb (94.3 kg)  01/25/21 217 lb 3.2 oz (98.5 kg)  01/22/21 208 lb (94.3 kg)   Temp Readings from Last 3 Encounters:  01/28/21 97.7 F (36.5 C)  01/25/21 98.3 F (36.8 C) (Oral)  01/22/21 98.2 F (36.8 C) (Oral)   BP Readings from Last 3 Encounters:  01/28/21 131/65  01/25/21 134/70  01/22/21 (!) 131/55   Pulse Readings from Last 3 Encounters:  01/28/21 72  01/25/21 80  01/22/21 65    /  10  In general this is a well appearing guy in no acute distress. He is alert and oriented x4 and appropriate throughout  the examination.   ECOG = 2  0 - Asymptomatic (Fully active, able to carry on all predisease activities without restriction)  1 - Symptomatic but completely ambulatory (Restricted in physically strenuous activity but ambulatory and able to carry out work of a light or sedentary nature. For example, light housework, office work)  2 - Symptomatic, <50% in bed during the day (Ambulatory and capable of all self care but unable to carry out any work activities. Up and about more than 50% of waking hours)  3 - Symptomatic, >50% in bed, but not bedbound (Capable of only limited self-care, confined to bed or chair 50% or more of waking hours)  4 - Bedbound (Completely disabled. Cannot carry on any self-care. Totally confined to bed or chair)  5 - Death   Eustace Pen MM, Creech RH, Tormey DC, et al. [phone]). "Toxicity and response criteria of the [facility]". [place] Oncol. 5 (6): 649-55    LABORATORY DATA:  Lab Results  Component Value Date   WBC 6.8 01/15/2021   HGB 8.4 (L) 01/15/2021   HCT 29.3 (L) 01/15/2021   MCV 71 (L) 01/15/2021   PLT 407 01/15/2021   Lab Results  Component Value Date   NA 149 (H) 01/15/2021   K 3.9 01/15/2021   CL 104 01/15/2021   CO2 29 01/15/2021   No results found for: ALT, AST, GGT, ALKPHOS, BILITOT    RADIOGRAPHY: CARDIAC CATHETERIZATION  Result Date: 01/22/2021   Mid LM to Dist LM lesion is 50% stenosed.   Prox RCA to Mid RCA lesion is 25% stenosed.   The left ventricular systolic function is normal.   LV end diastolic pressure is normal.   The left ventricular ejection fraction is 55-65% by visual estimate. Nonobstructive CAD. There is a 50% distal left main stenosis but this is not hemodynamically significant by flow wire assessment. Normal LV function Normal LVEDP Plan: patient is cleared to proceed with evaluation and treatment of his lung mass. Risk factor modification.       IMPRESSION/PLAN: 1. Putative, Stage III NSCLC,  of the LLL. Dr. Tammi Klippel discusses the patient's course to date and discusses the rational for further characterization by cytology/pathology. It appears that there is at least locally advanced disease. Dr. Tammi Klippel recommends confirming his work up with MRI of the brain as well as further work up of the hypermetabolism in the right femoral region. He would likely benefit from a course of chemoradiation provided he does not have metastatic disease to the brain. We discussed the risks, benefits, short, and long term effects of radiotherapy, as well as the curative intent, and the patient is interested in proceeding. Dr. Tammi Klippel discusses the delivery and logistics of radiotherapy and anticipates a course of up to 6 1/2 weeks of radiotherapy to the chest. He will be contacted by simulation staff to schedule planning for radiation in the near future. We will also send a stat request for medical oncology to see him. 2. Right femoral neck hypermetabolism. As above we recommended pelvic MRI for further characterization of the PET scan finding. He may need biopsy of a lesion can be more definitively noted.     Given current concerns for patient exposure during the COVID-19 pandemic, this encounter was conducted via telephone.  The patient has provided two factor identification and has given verbal consent  for this type of encounter and has been advised to only accept a meeting of this type in a secure network environment. The time spent during this encounter was 60 minutes including preparation, discussion, and coordination of the patient's care. The attendants for this meeting include Blenda Nicely, RN, Dr. Tammi Klippel, Hayden Pedro  and [name].  During the encounter,  Blenda Nicely, RN, Dr. Tammi Klippel, and Hayden Pedro were located at [facility] Radiation Oncology Department.  Micheline Maze Lamarre was located at home.         Carola Rhine, PAC And   Sheral Apley  Tammi Klippel, M.D.   **Disclaimer: This note was dictated with voice recognition software. Similar sounding words can inadvertently be transcribed and this note may contain transcription errors which may not have been corrected upon publication of note.**

## 2021-01-29 NOTE — Progress Notes (Signed)
Oncology Nurse Navigator Documentation  Oncology Nurse Navigator Flowsheets 01/29/2021  Abnormal Finding Date 10/25/2020  Confirmed Diagnosis Date 01/28/2021  Diagnosis Status Pathology Pending  Navigator Follow Up Date: 02/01/2021  Navigator Follow Up Reason: New Patient Appointment  Navigator Location [facility]  Referral Date to RadOnc/MedOnc 01/28/2021  Navigator Encounter Type Other:/I received a referral on Mr. Heide yesterday.  He has been seen by medical oncology in the past due to anemia. I reached out to Dr. Lindi Adie to see if it is ok that he is seen with Dr. Julien Nordmann. He states yes. I updated new patient coordinator to call and schedule him to be seen this Friday with labs. Will place lab orders per protocol.  I updated rad onc team with an update.   Patient Visit Type Other  Treatment Phase Pre-Tx/Tx Discussion  Barriers/Navigation Needs Coordination of Care  Interventions Coordination of Care  Acuity Level 3-Moderate Needs (3-4 Barriers Identified)  Coordination of Care Other  Time Spent with Patient 45

## 2021-01-29 NOTE — Telephone Encounter (Signed)
Called pt's wife again to confirm new pt appt with Dr. Julien Nordmann. She is aware of appt date and time.

## 2021-01-30 ENCOUNTER — Ambulatory Visit
Admission: RE | Admit: 2021-01-30 | Discharge: 2021-01-30 | Disposition: A | Discharge status: Home / Self Care | Payer: Medicare Other | Source: Ambulatory Visit | Attending: Radiation Oncology | Admitting: Radiation Oncology

## 2021-01-30 ENCOUNTER — Telehealth: Payer: Self-pay | Admitting: Pulmonary Disease

## 2021-01-30 ENCOUNTER — Encounter (HOSPITAL_COMMUNITY): Payer: Self-pay | Admitting: Pulmonary Disease

## 2021-01-30 DIAGNOSIS — Z87891 Personal history of nicotine dependence: Secondary | ICD-10-CM | POA: Diagnosis not present

## 2021-01-30 DIAGNOSIS — C3432 Malignant neoplasm of lower lobe, left bronchus or lung: Secondary | ICD-10-CM | POA: Diagnosis not present

## 2021-01-30 DIAGNOSIS — Z510 Encounter for antineoplastic radiation therapy: Secondary | ICD-10-CM | POA: Insufficient documentation

## 2021-01-30 NOTE — Telephone Encounter (Signed)
PCCM:  Called and spoke with patient regarding pathology results.  Already set up with medical oncology and radiation oncology appointments.  [name], DO [facility] 01/30/2021 6:40 PM

## 2021-01-30 NOTE — Progress Notes (Signed)
  Radiation Oncology         (336) [phone] ________________________________  Name: James [name] MRN: [id number]  Date: 01/30/2021  DOB: 1946-02-22  SIMULATION AND TREATMENT PLANNING NOTE    ICD-10-CM   1. 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  Identity was confirmed.  All relevant records and images related to the planned course of therapy were reviewed.  The patient freely provided informed written consent to proceed with treatment after reviewing the details related to the planned course of therapy. The consent form was witnessed and verified by the simulation staff.  Then, the patient was set-up in a stable reproducible  supine position for radiation therapy.  CT images were obtained.  Surface markings were placed.  The CT images were loaded into the planning software.  Then the target and avoidance structures were contoured.  Treatment planning then occurred.  The radiation prescription was entered and confirmed.  Then, I designed and supervised the construction of a total of 6 medically necessary complex treatment devices, including a BodyFix immobilization mold custom fitted to the patient along with 5 multileaf collimators conformally shaped radiation around the treatment target while shielding critical structures such as the heart and spinal cord maximally.  I have requested : 3D Simulation  I have requested a DVH of the following structures: Left lung, right lung, spinal cord, heart, esophagus, and target.  I have ordered:Nutrition Consult  SPECIAL TREATMENT PROCEDURE:  The planned course of therapy using radiation constitutes a special treatment procedure. Special care is required in the management of this patient for the following reasons.  The  patient will be receiving concurrent chemotherapy requiring careful monitoring for increased toxicities of treatment including periodic laboratory values.  The special nature of the planned course of radiotherapy will require increased physician supervision and oversight to ensure patient's safety with optimal treatment outcomes.  PLAN:  The patient will receive 66 Gy in 33 fractions.  ________________________________  Sheral Apley Tammi Klippel, M.D.

## 2021-01-31 DIAGNOSIS — C3432 Malignant neoplasm of lower lobe, left bronchus or lung: Secondary | ICD-10-CM | POA: Diagnosis not present

## 2021-01-31 DIAGNOSIS — Z87891 Personal history of nicotine dependence: Secondary | ICD-10-CM | POA: Diagnosis not present

## 2021-01-31 DIAGNOSIS — Z510 Encounter for antineoplastic radiation therapy: Secondary | ICD-10-CM | POA: Diagnosis not present

## 2021-02-01 ENCOUNTER — Ambulatory Visit (HOSPITAL_COMMUNITY)
Admission: RE | Admit: 2021-02-01 | Discharge: 2021-02-01 | Disposition: A | Discharge status: Home / Self Care | Payer: Medicare Other | Source: Ambulatory Visit | Attending: Radiation Oncology | Admitting: Radiation Oncology

## 2021-02-01 ENCOUNTER — Other Ambulatory Visit: Payer: Self-pay | Admitting: Neurology

## 2021-02-01 ENCOUNTER — Other Ambulatory Visit: Payer: Self-pay

## 2021-02-01 ENCOUNTER — Encounter: Payer: Self-pay | Admitting: *Deleted

## 2021-02-01 ENCOUNTER — Inpatient Hospital Stay: Payer: Medicare Other | Attending: Hematology and Oncology

## 2021-02-01 ENCOUNTER — Inpatient Hospital Stay (HOSPITAL_BASED_OUTPATIENT_CLINIC_OR_DEPARTMENT_OTHER): Payer: Medicare Other | Admitting: Internal Medicine

## 2021-02-01 ENCOUNTER — Other Ambulatory Visit: Payer: Self-pay | Admitting: Internal Medicine

## 2021-02-01 ENCOUNTER — Encounter: Payer: Self-pay | Admitting: Internal Medicine

## 2021-02-01 VITALS — BP 140/62 | HR 90 | Temp 97.8°F | Resp 18 | Wt 212.0 lb

## 2021-02-01 DIAGNOSIS — C3432 Malignant neoplasm of lower lobe, left bronchus or lung: Secondary | ICD-10-CM | POA: Insufficient documentation

## 2021-02-01 DIAGNOSIS — C3492 Malignant neoplasm of unspecified part of left bronchus or lung: Secondary | ICD-10-CM

## 2021-02-01 DIAGNOSIS — I10 Essential (primary) hypertension: Secondary | ICD-10-CM

## 2021-02-01 DIAGNOSIS — Z87891 Personal history of nicotine dependence: Secondary | ICD-10-CM | POA: Diagnosis not present

## 2021-02-01 DIAGNOSIS — E119 Type 2 diabetes mellitus without complications: Secondary | ICD-10-CM | POA: Diagnosis not present

## 2021-02-01 DIAGNOSIS — D509 Iron deficiency anemia, unspecified: Secondary | ICD-10-CM

## 2021-02-01 DIAGNOSIS — G20 Parkinson's disease: Secondary | ICD-10-CM

## 2021-02-01 DIAGNOSIS — M160 Bilateral primary osteoarthritis of hip: Secondary | ICD-10-CM | POA: Diagnosis not present

## 2021-02-01 DIAGNOSIS — Z7984 Long term (current) use of oral hypoglycemic drugs: Secondary | ICD-10-CM | POA: Diagnosis not present

## 2021-02-01 DIAGNOSIS — J449 Chronic obstructive pulmonary disease, unspecified: Secondary | ICD-10-CM

## 2021-02-01 DIAGNOSIS — Z5111 Encounter for antineoplastic chemotherapy: Secondary | ICD-10-CM | POA: Insufficient documentation

## 2021-02-01 DIAGNOSIS — R0789 Other chest pain: Secondary | ICD-10-CM | POA: Diagnosis not present

## 2021-02-01 DIAGNOSIS — Z9981 Dependence on supplemental oxygen: Secondary | ICD-10-CM

## 2021-02-01 DIAGNOSIS — C3490 Malignant neoplasm of unspecified part of unspecified bronchus or lung: Secondary | ICD-10-CM | POA: Diagnosis not present

## 2021-02-01 DIAGNOSIS — G319 Degenerative disease of nervous system, unspecified: Secondary | ICD-10-CM | POA: Diagnosis not present

## 2021-02-01 LAB — CBC WITH DIFFERENTIAL (CANCER CENTER ONLY)
Abs Immature Granulocytes: 0.02 10*3/uL (ref 0.00–0.07)
Basophils Absolute: 0 10*3/uL (ref 0.0–0.1)
Basophils Relative: 1 %
Eosinophils Absolute: 0.2 10*3/uL (ref 0.0–0.5)
Eosinophils Relative: 2 %
HCT: 27 % — ABNORMAL LOW (ref 39.0–52.0)
Hemoglobin: 7.5 g/dL — ABNORMAL LOW (ref 13.0–17.0)
Immature Granulocytes: 0 %
Lymphocytes Relative: 17 %
Lymphs Abs: 1.2 10*3/uL (ref 0.7–4.0)
MCH: 19.7 pg — ABNORMAL LOW (ref 26.0–34.0)
MCHC: 27.8 g/dL — ABNORMAL LOW (ref 30.0–36.0)
MCV: 70.9 fL — ABNORMAL LOW (ref 80.0–100.0)
Monocytes Absolute: 0.7 10*3/uL (ref 0.1–1.0)
Monocytes Relative: 10 %
Neutro Abs: 5.1 10*3/uL (ref 1.7–7.7)
Neutrophils Relative %: 70 %
Platelet Count: 355 10*3/uL (ref 150–400)
RBC: 3.81 MIL/uL — ABNORMAL LOW (ref 4.22–5.81)
RDW: 18.1 % — ABNORMAL HIGH (ref 11.5–15.5)
WBC Count: 7.3 10*3/uL (ref 4.0–10.5)
nRBC: 0 % (ref 0.0–0.2)

## 2021-02-01 LAB — CMP (CANCER CENTER ONLY)
ALT: <5 U/L (ref 0–44)
AST: 7 U/L — ABNORMAL LOW (ref 15–41)
Albumin: 3.1 g/dL — ABNORMAL LOW (ref 3.5–5.0)
Alkaline Phosphatase: 75 U/L (ref 38–126)
Anion gap: 11 (ref 5–15)
BUN: 10 mg/dL (ref 8–23)
CO2: 30 mmol/L (ref 22–32)
Calcium: 9.1 mg/dL (ref 8.9–10.3)
Chloride: 102 mmol/L (ref 98–111)
Creatinine: 0.77 mg/dL (ref 0.61–1.24)
GFR, Estimated: >60 mL/min (ref >60)
Glucose, Bld: 127 mg/dL — ABNORMAL HIGH (ref 70–99)
Potassium: 2.9 mmol/L — ABNORMAL LOW (ref 3.5–5.1)
Sodium: 143 mmol/L (ref 135–145)
Total Bilirubin: 0.4 mg/dL (ref 0.3–1.2)
Total Protein: 6.4 g/dL — ABNORMAL LOW (ref 6.5–8.1)

## 2021-02-01 IMAGING — MR MR PELVIS WO/W CM
9 series · 48 of 48 positions shown · IV contrast (gadavist)
Comparison: PET-CT [DATE]

CLINICAL DATA: Lung cancer. Abnormal PET-CT with focal
hypermetabolism anterior to the right femoral neck.

EXAM:
MRI PELVIS WITHOUT AND WITH CONTRAST
TECHNIQUE: Multiplanar multisequence MR imaging of the pelvis was performed
both before and after administration of intravenous contrast.
CONTRAST:  10mL GADAVIST GADOBUTROL 1 MMOL/ML IV SOLN

[Series 3: 3 plane loc · axial · 3.0mm · 1.56mm/px · z∈[-727,-509]mm · 5 of 27 slices shown]
[im 1/27]
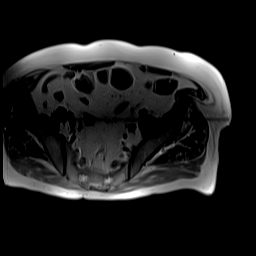
[im 7/27]
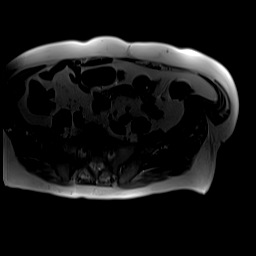
[im 14/27]
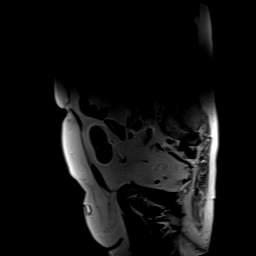
[im 20/27]
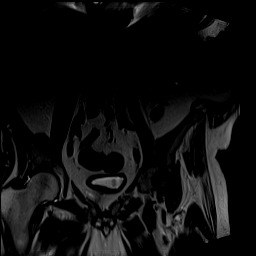
[im 27/27]
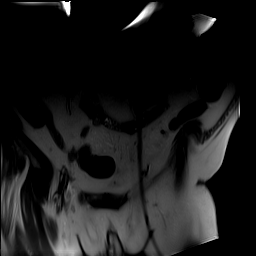

[Series 4: T1 · coronal · 5.0mm · 1.25mm/px · 5 of 28 slices shown (1 of 3)]
[im 1/28]
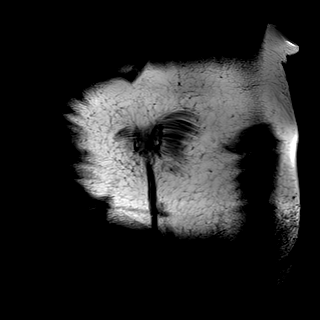
[im 7/28]
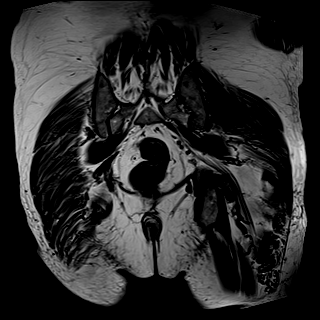
[im 14/28]
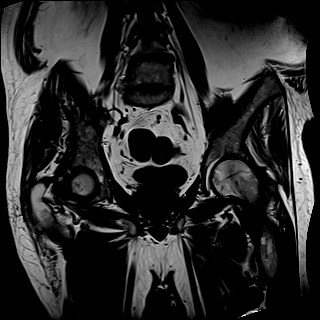
[im 21/28]
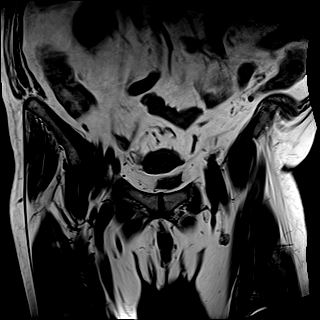
[im 28/28]
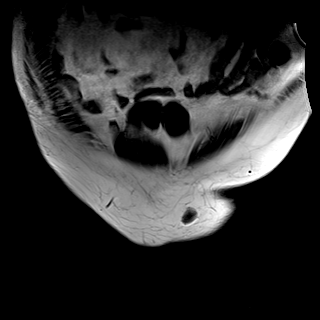

[Series 5: STIR · coronal · 5.0mm · 1.25mm/px · 4 of 28 slices shown]
[im 1/28]
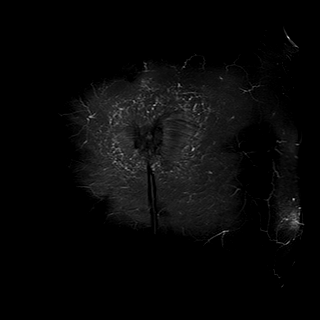
[im 10/28]
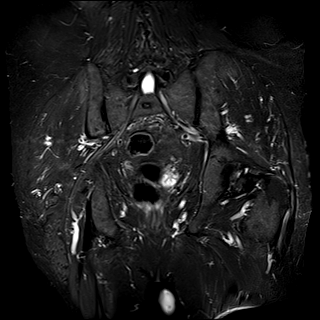
[im 19/28]
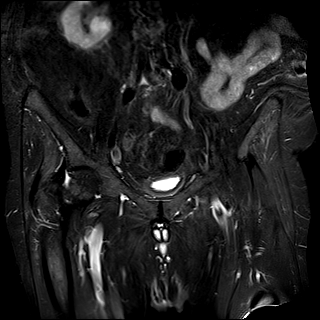
[im 28/28]
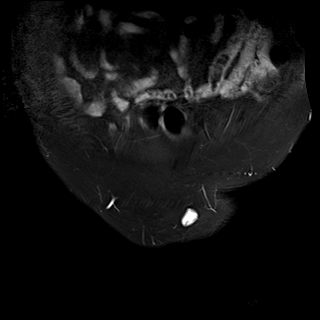

[Series 6: T1 · axial · 5.0mm · 1.25mm/px · z∈[-907,-637]mm · 6 of 41 slices shown (2 of 3)]
[im 1/41]
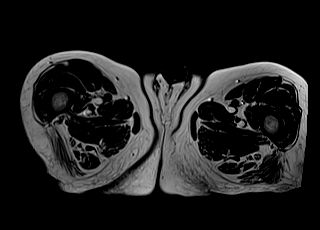
[im 9/41]
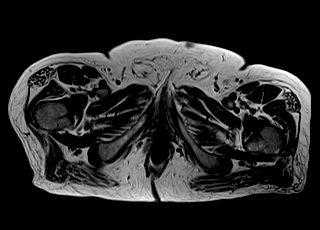
[im 17/41]
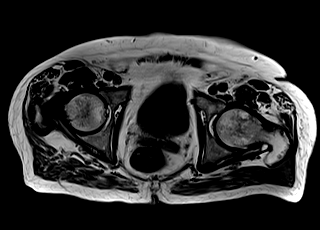
[im 25/41]
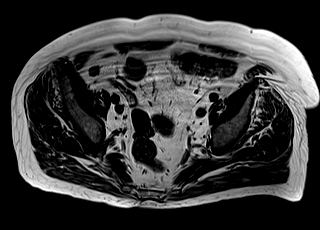
[im 33/41]
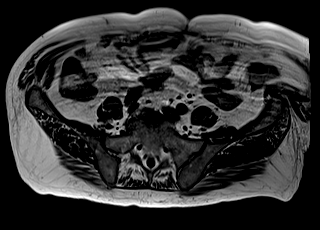
[im 41/41]
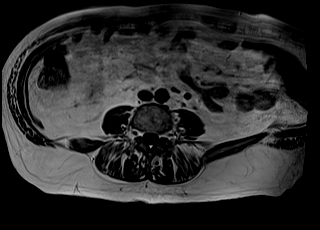

[Series 7: T2 fat-sat · axial · 5.0mm · 1.25mm/px · z∈[-907,-637]mm · 6 of 41 slices shown (1 of 2)]
[im 1/41]
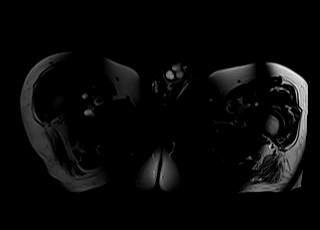
[im 9/41]
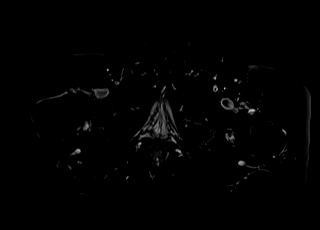
[im 17/41]
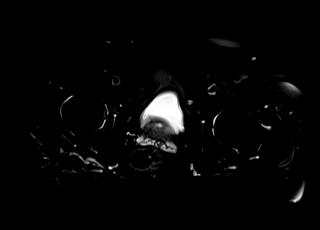
[im 25/41]
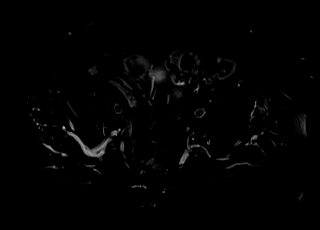
[im 33/41]
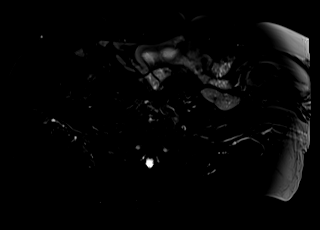
[im 41/41]
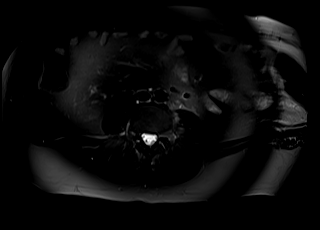

[Series 8: T2 fat-sat · sagittal · 6.0mm · 1.03mm/px · 6 of 43 slices shown (2 of 2)]
[im 1/43]
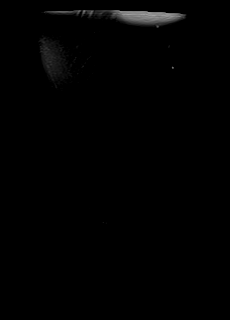
[im 9/43]
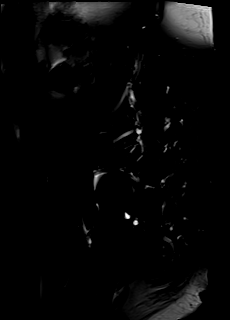
[im 17/43]
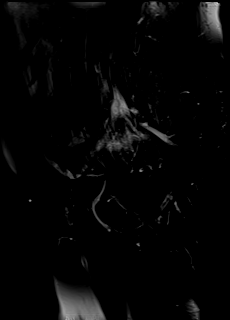
[im 26/43]
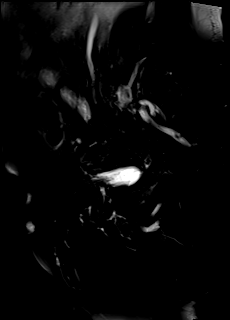
[im 34/43]
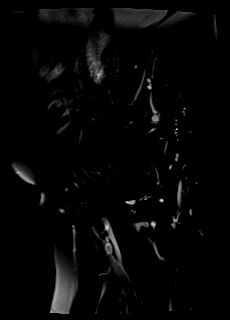
[im 43/43]
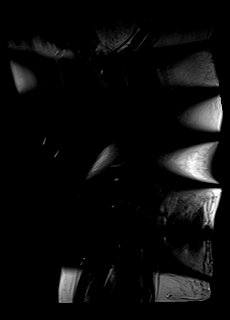

[Series 9: T1 · axial · non-contrast · 5.0mm · 1.25mm/px · z∈[-907,-637]mm · 6 of 41 slices shown (3 of 3)]
[im 1/41]
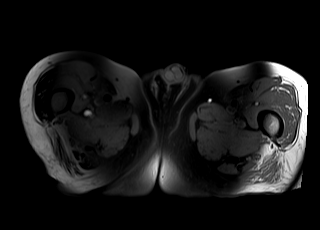
[im 9/41]
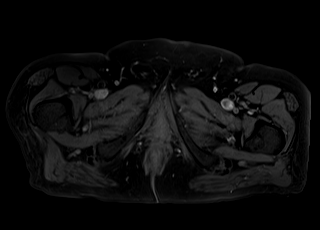
[im 17/41]
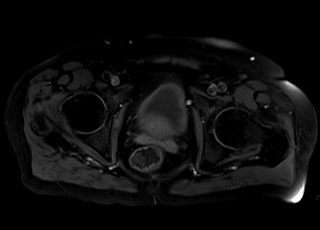
[im 25/41]
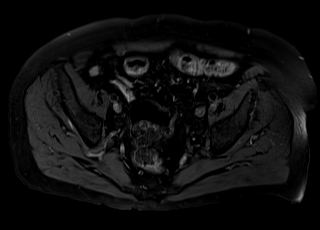
[im 33/41]
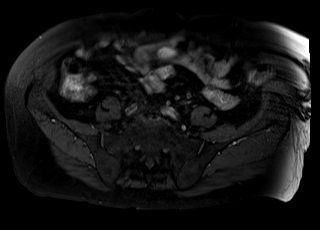
[im 41/41]
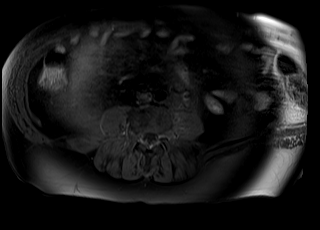

[Series 10: T1 post-contrast · axial · 5.0mm · 1.25mm/px · z∈[-907,-637]mm · 6 of 41 slices shown (1 of 2)]
[im 1/41]
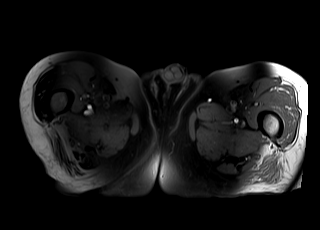
[im 9/41]
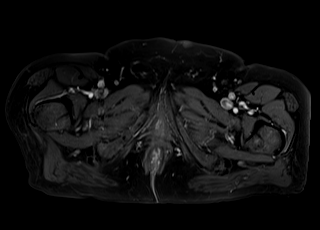
[im 17/41]
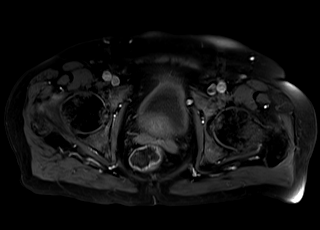
[im 25/41]
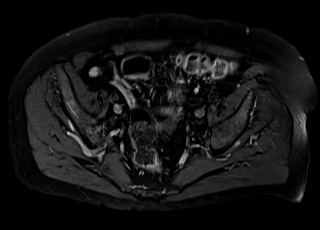
[im 33/41]
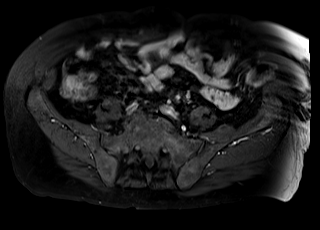
[im 41/41]
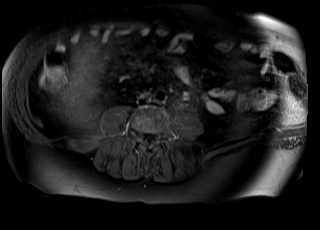

[Series 11: T1 post-contrast · coronal · 5.0mm · 1.25mm/px · 4 of 28 slices shown (2 of 2)]
[im 1/28]
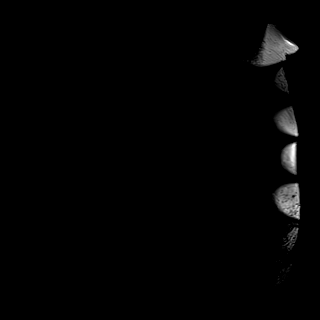
[im 10/28]
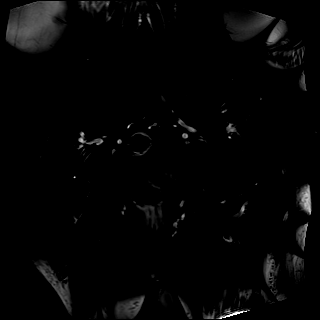
[im 19/28]
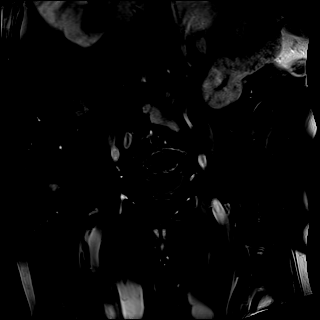
[im 28/28]
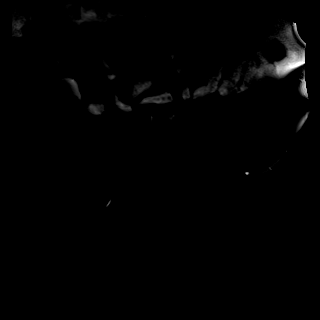

[48 of 48 positions shown; findings below may reference images not displayed]

FINDINGS: Bones/Joint/Cartilage

No acute fracture. No dislocation. No femoral head avascular
necrosis. Bony pelvis intact without diastasis. Mild chondral
thinning of both hip joints. No hip joint effusion. SI joints and
pubic symphysis within normal limits. No bone marrow edema. No
marrow replacing bone lesion.

Ligaments

Intact.

Muscles and Tendons

The bilateral gluteal, hamstring, iliopsoas, rectus femoris, and
adductor tendons appear intact without tear or significant
tendinosis. Normal muscle bulk and signal intensity without edema,
atrophy, or fatty infiltration.

Soft tissues

Adjacent to the anterior margin of the right femoral neck is a small
nodular focal enhancing lesion measuring 1.9 x 1.1 x 1.4 cm (series
10, image 26; series 11, image 20). Lesion is intermediate to low
signal on both T1 and T2 weighted imaging. It is difficult determine
whether this lesion is intra-articular or extra-articular on large
field-of-view imaging of the pelvis and lack of intra-articular
fluid within the right hip joint. No additional enhancing masses are
identified.

Incidentally noted 2.1 cm subcutaneous cyst along the anterior lower
abdominopelvic wall, left of midline, likely sebaceous cyst. Soft
tissues are otherwise within normal limits. No soft tissue edema. No
pelvic or inguinal lymphadenopathy.
IMPRESSION: 1. Adjacent to the anterior margin of the right femoral neck is a
small nodular focal enhancing lesion measuring 1.9 x 1.1 x 1.4 cm.
It is difficult determine whether this lesion is intra-articular or
extra-articular on large field-of-view imaging of the pelvis and
lack of intra-articular fluid within the right hip joint.
Differential considerations include a small metastatic lesion versus
focal synovitis.
2. No acute osseous findings. No evidence of osseous metastatic
disease.
3. Mild bilateral hip osteoarthritis.

## 2021-02-01 IMAGING — MR MR HEAD WO/W CM
15 series · 48 of 48 positions shown · IV contrast (gadavist)
Comparison: Prior PET-CT from [DATE].

CLINICAL DATA: Initial evaluation for non-small cell lung cancer,
staging.

EXAM:
MRI HEAD WITHOUT AND WITH CONTRAST
TECHNIQUE: Multiplanar, multiecho pulse sequences of the brain and surrounding
structures were obtained without and with intravenous contrast.
CONTRAST:  10mL GADAVIST GADOBUTROL 1 MMOL/ML IV SOLN

[Series 5: DWI · axial · 3.0mm · 1.36mm/px · z∈[-54,+99]mm · 5 of 96 slices shown (1 of 2)]
[im 1/96]
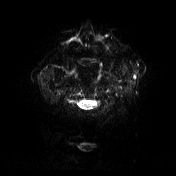
[im 24/96]
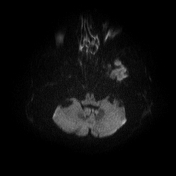
[im 48/96]
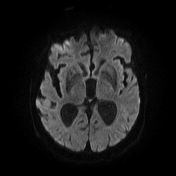
[im 72/96]
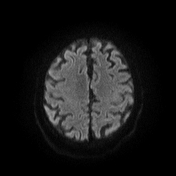
[im 96/96]
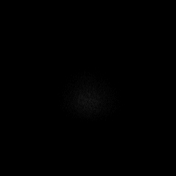

[Series 6: DWI · axial · 3.0mm · 1.36mm/px · z∈[-54,+99]mm · 3 of 48 slices shown (2 of 2)]
[im 1/48]
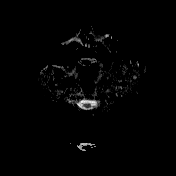
[im 24/48]
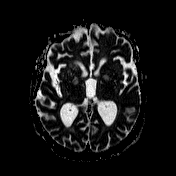
[im 48/48]
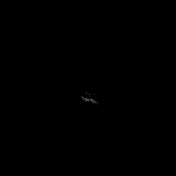

[Series 7: T1 · sagittal · 5.0mm · 0.75mm/px · 1 of 24 slices shown (1 of 4)]
[im 1/24]
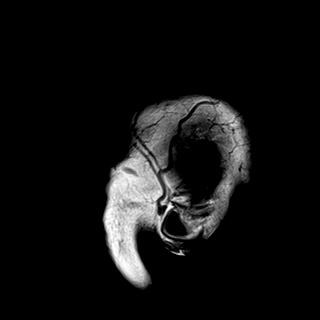

[Series 8: T2 · axial · 5.0mm · 0.62mm/px · 1 of 26 slices shown (1 of 2)]
[im 1/26]
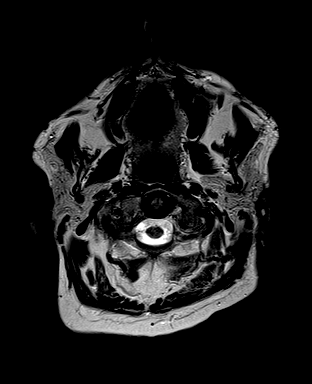

[Series 9: FLAIR · axial · 3.0mm · 0.75mm/px · z∈[-49,+103]mm · 3 of 52 slices shown]
[im 1/52]
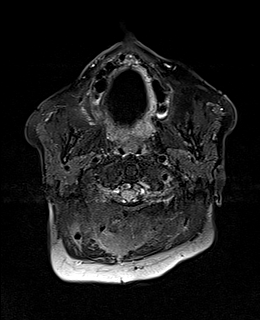
[im 26/52]
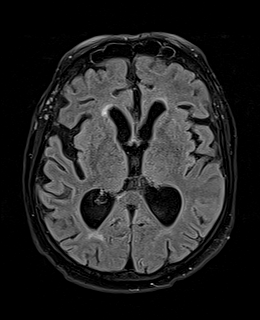
[im 52/52]
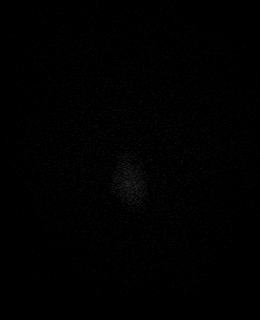

[Series 10: swi_images · axial · 3.0mm · 0.75mm/px · z∈[-53,+107]mm · 3 of 60 slices shown]
[im 1/60]
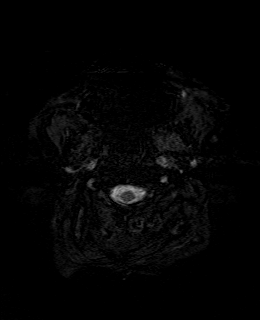
[im 30/60]
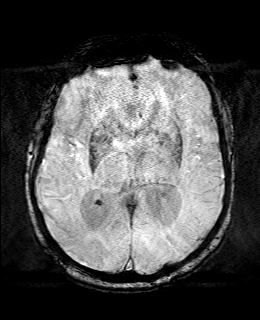
[im 60/60]
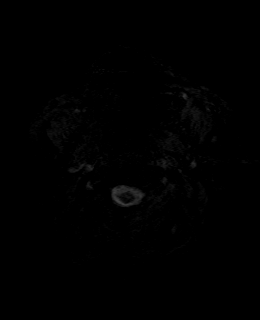

[Series 12: T1 · axial · 1.0mm · 0.94mm/px · z∈[-43,+101]mm · 9 of 160 slices shown (2 of 4)]
[im 1/160]
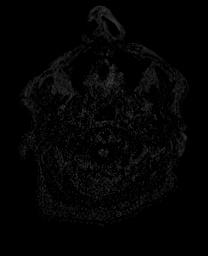
[im 20/160]
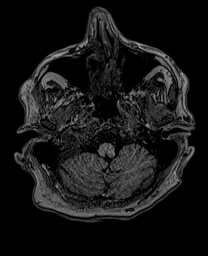
[im 40/160]
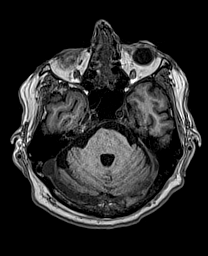
[im 60/160]
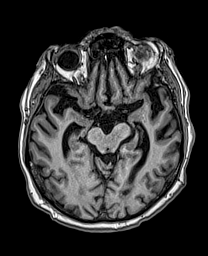
[im 80/160]
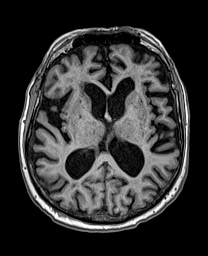
[im 100/160]
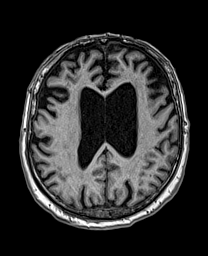
[im 120/160]
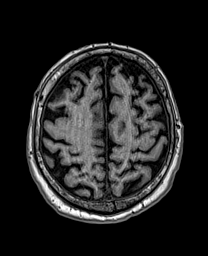
[im 140/160]
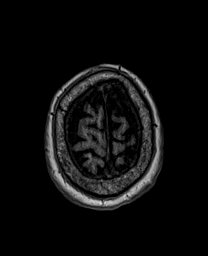
[im 160/160]
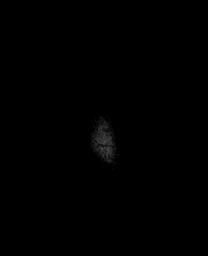

[Series 13: cor dwi_tracew · coronal · 5.0mm · 1.53mm/px · 3 of 60 slices shown]
[im 1/60]
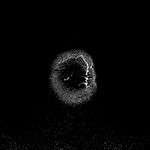
[im 30/60]
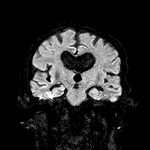
[im 60/60]
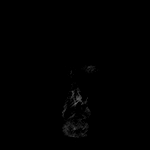

[Series 14: cor dwi_adc · coronal · 5.0mm · 1.53mm/px · 2 of 30 slices shown]
[im 1/30]
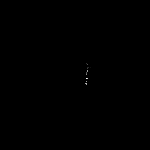
[im 30/30]
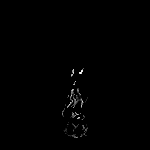

[Series 15: T2 · coronal · 5.0mm · 0.57mm/px · 2 of 30 slices shown (2 of 2)]
[im 1/30]
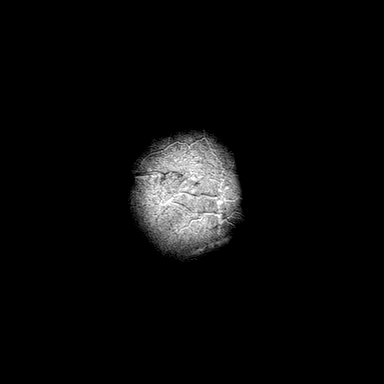
[im 30/30]
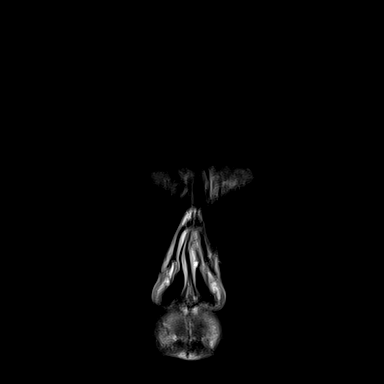

[Series 28: T1 post-contrast · axial · 1.0mm · 0.94mm/px · z∈[-43,+101]mm · 9 of 160 slices shown (1 of 3)]
[im 1/160]
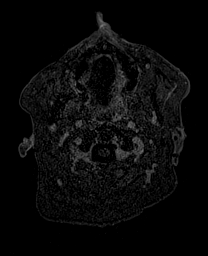
[im 20/160]
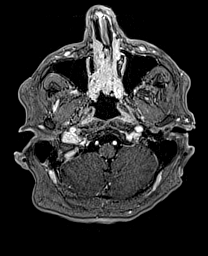
[im 40/160]
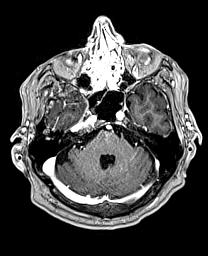
[im 60/160]
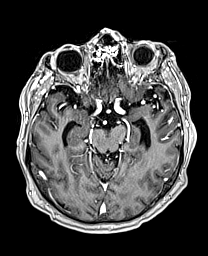
[im 80/160]
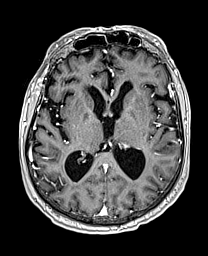
[im 100/160]
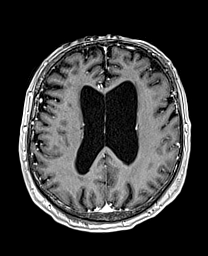
[im 120/160]
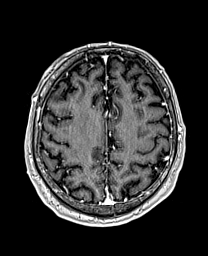
[im 140/160]
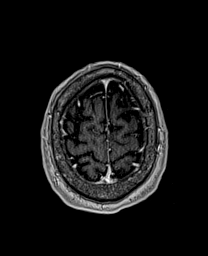
[im 160/160]
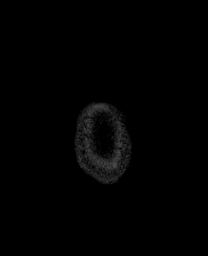

[Series 29: T1 · sagittal · 4.0mm · 0.94mm/px · 2 of 37 slices shown (3 of 4)]
[im 1/37]
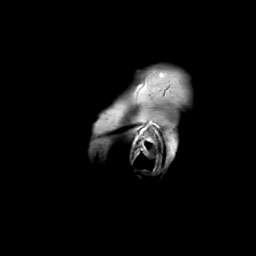
[im 37/37]
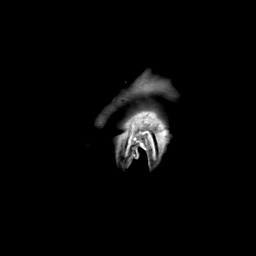

[Series 30: T1 · coronal · 4.0mm · 0.94mm/px · 2 of 44 slices shown (4 of 4)]
[im 1/44]
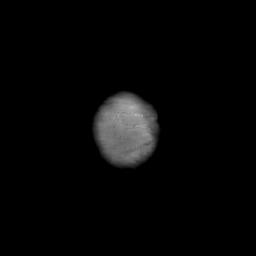
[im 44/44]
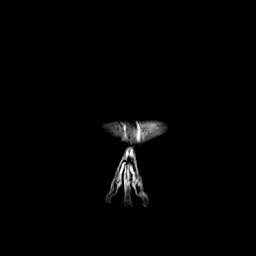

[Series 31: T1 post-contrast · coronal · 5.0mm · 0.43mm/px · 2 of 30 slices shown (2 of 3)]
[im 1/30]
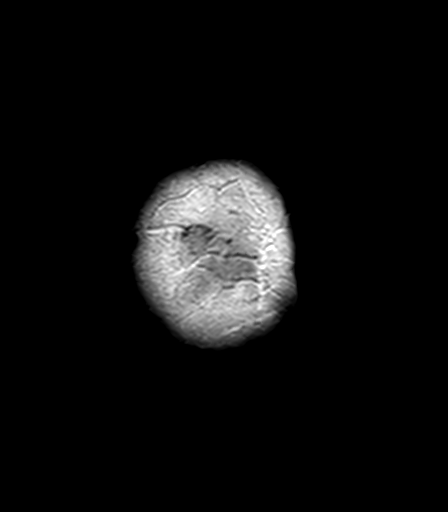
[im 30/30]
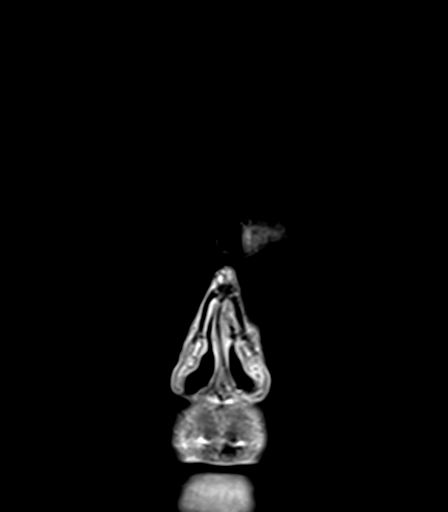

[Series 32: T1 post-contrast · sagittal · 5.0mm · 0.75mm/px · 1 of 24 slices shown (3 of 3)]
[im 1/24]
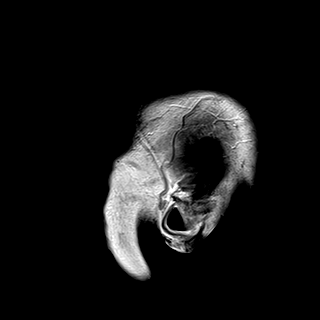

[48 of 48 positions shown; findings below may reference images not displayed]

FINDINGS: Brain: Generalized age-related cerebral atrophy. Scattered patchy
T2/FLAIR hyperintensity involving the periventricular and deep white
matter both cerebral hemispheres as well as the pons, most
consistent with chronic small vessel ischemic disease, mild in
nature.

No evidence for acute or subacute infarct. Gray-white matter
differentiation maintained. No encephalomalacia to suggest chronic
cortical infarction. No evidence for acute or chronic intracranial
hemorrhage.

No mass lesion, midline shift or mass effect. Mild ventricular
prominence related global parenchymal volume loss without
hydrocephalus. Pituitary gland suprasellar region within normal
limits. Midline structures intact and normal.

No abnormal enhancement or evidence for intracranial metastatic
disease.

Vascular: Major intracranial vascular flow voids are maintained.

Skull and upper cervical spine: Craniocervical junction within
normal limits. Bone marrow signal intensity normal. No visible focal
marrow replacing lesion. No scalp soft tissue abnormality.

Sinuses/Orbits: Patient status post bilateral ocular lens
replacement. Globes and orbital soft tissues demonstrate no acute
finding. Scattered mucosal thickening noted throughout the paranasal
sinuses. Trace left mastoid effusion noted, of doubtful
significance.

Other: None.
IMPRESSION: 1. No evidence for intracranial metastatic disease.
2. Mild age-related cerebral atrophy with chronic small vessel
ischemic disease.

## 2021-02-01 MED ORDER — POTASSIUM CHLORIDE CRYS ER 20 MEQ PO TBCR: 20.0000 meq | EXTENDED_RELEASE_TABLET | Freq: Two times a day (BID) | ORAL | 0 refills | Status: DC

## 2021-02-01 MED ORDER — TRAMADOL HCL 50 MG PO TABS
50.0000 mg | ORAL_TABLET | Freq: Four times a day (QID) | ORAL | 0 refills | Status: DC | PRN
Start: 2021-02-01 — End: 2021-04-03

## 2021-02-01 MED ORDER — GADOBUTROL 1 MMOL/ML IV SOLN
10.0000 mL | Freq: Once | INTRAVENOUS | Status: AC | PRN
  Administered 2021-02-01: 10 mL via INTRAVENOUS

## 2021-02-01 MED ORDER — PROCHLORPERAZINE MALEATE 10 MG PO TABS: 10.0000 mg | ORAL_TABLET | Freq: Four times a day (QID) | ORAL | 0 refills | Status: AC | PRN

## 2021-02-01 NOTE — Progress Notes (Signed)
Oncology Nurse Navigator Documentation  Oncology Nurse Navigator Flowsheets 02/01/2021 01/29/2021  Abnormal Finding Date - 10/25/2020  Confirmed Diagnosis Date - 01/28/2021  Diagnosis Status Confirmed Diagnosis Complete Pathology Pending  Planned Course of Treatment Chemo/Radiation Concurrent -  Phase of Treatment Radiation -  Navigator Follow Up Date: 02/05/2021 02/01/2021  Navigator Follow Up Reason: Appointment Review New Patient Appointment  Navigator Location [place]  Referral Date to RadOnc/MedOnc - 01/28/2021  Navigator Encounter Type Initial MedOnc Other:  Patient Visit Type Initial;MedOnc Other  Treatment Phase Pre-Tx/Tx Discussion Pre-Tx/Tx Discussion  Barriers/Navigation Needs Coordination of Care;Education/I spoke with patient and his wife today.  He is newly dx stage IIB lung cancer with additional work up pending. I called radiology and was able to get patient his MRI Brain and Pelvis scheduled for tonight. They verbalized understanding of appt time and place. I also gave and explained dx and treatment plan.  Coordination of Care  Education Newly Diagnosed Cancer Education;Other -  Interventions Coordination of Care;Psycho-Social Support;Education Coordination of Care  Acuity Level 4-High Needs (Greater Than 4 Barriers Identified) Level 3-Moderate Needs (3-4 Barriers Identified)  Coordination of Care Appts Other  Education Method Verbal;Written -  Time Spent with Patient 51 70

## 2021-02-01 NOTE — Progress Notes (Signed)
START ON PATHWAY REGIMEN - Non-Small Cell Lung     Administer weekly:     Paclitaxel      Carboplatin   **Always confirm dose/schedule in your pharmacy ordering system**  Patient Characteristics: Preoperative or Nonsurgical Candidate (Clinical Staging), Stage III - Nonsurgical Candidate (Nonsquamous and Squamous), PS = 0, 1 Therapeutic Status: Preoperative or Nonsurgical Candidate (Clinical Staging) AJCC T Category: cT3 AJCC N Category: cN1 AJCC M Category: cM0 AJCC 8 Stage Grouping: IIIA ECOG Performance Status: 1 Intent of Therapy: Curative Intent, Discussed with Patient

## 2021-02-01 NOTE — Progress Notes (Signed)
[place] [phone]   [phone]  CONSULT NOTE  REFERRING PHYSICIAN: Dr. Leory Plowman Icard  REASON FOR CONSULTATION:  75 years old white male recently diagnosed with lung cancer  HPI James [name] is a 75 y.o. male with past medical history significant for multiple medical problems including hypertension, dyslipidemia, diabetes mellitus, COPD, depression, osteoarthritis, iron deficiency anemia, glaucoma, diverticulosis as well as rosacea, Parkinson's disease and long history of smoking.  The patient was followed by the pulmonary medicine team for his COPD and he has a known pulmonary nodules.  He had repeat CT scan of the chest without contrast on May 10, 2020 and that showed unchanged nodule of the dependent right lung base measuring 1.2 x 0.7 cm in addition to unchanged 0.4 cm nodule in the medial superior segment of the left lower lobe.  In July 2022 the patient presented with COPD exacerbation and hemoptysis.  Chest x-ray on 09/12/2020 showed small to moderate left pleural effusion with left basilar atelectasis.  He underwent ultrasound-guided left thoracentesis by interventional radiology.  The final cytology showed no malignant cells identified.  He also had repeat ultrasound-guided left thoracentesis on 10/25/2020 and the final cytology showed atypical cells present.  Repeat CT scan of the chest on 11/17/2020 showed possible obstructing central neoplasm in the left lower lobe with postobstructive atelectasis and airspace consolidation throughout the left lung and moderate parapneumonic left pleural effusion.  A PET scan was performed on December 26, 2020 and that showed central left lower lobe pulmonary mass with central necrosis with marked peripheral hypermetabolism consistent with primary neoplasm.  There was FDG accumulation in the collapsed lung peripheral to the central lesion that indeterminate and may be infectious/inflammatory but tumor spread was  not excluded.  There was no hypermetabolic lymphadenopathy in the mediastinum or right hilum and no evidence of distant metastatic disease in the abdomen or pelvis.  There was a small focus of hypermetabolism anterior to the right femoral neck without underlying bony or soft tissue lesion that need close monitoring.  The patient was referred to Dr. Valeta Harms and on January 28, 2021 he underwent video bronchoscopy procedure with endobronchial cryobiopsies, cryotherapy, balloon dilatation and lesion excision and relief of obstruction. 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 The patient is scheduled to have MRI of the brain as well as hip later tonight. When seen today he continues to complain of the substernal chest pain as well as fatigue.  He also has shortness of breath at baseline and currently on oxygen.  He has cough with occasional hemoptysis.  He lost around 25 pounds in the last 6 months.  He has occasional nausea and diarrhea but no vomiting, abdominal pain or constipation.  He has no headache but has blurry vision. Family history significant for mother died at age 42 with COPD.  Father died from alcohol abuse at age 40. The patient is married and has 1 stepson.  He used to work for [employer] and [employer].  He was accompanied today by his wife James [name].  The patient has a history for smoking more than 1 pack/day for around 58 years and quit in 2021.  He also has a history of alcohol abuse but not in the last 5 years.  He has no history of drug abuse.  HPI  Past Medical History:  Diagnosis Date   Allergy    seasonal   Blind left eye    legally   Blood transfusion  2002   COPD (chronic obstructive pulmonary disease) (HCC)    Depression    Diabetes mellitus, type 2 (HCC)    Diverticulosis    Dyspnea    ED (erectile dysfunction)    Glaucoma    Hyperlipidemia    Hypertension    Insomnia    Internal and external bleeding  hemorrhoids    Iron deficiency anemia    Osteoarthritis    Parkinson's disease ([place]) 2014   Dr. [name] PCP  and Dr. [name] at [facility]   Rosacea    Tremor of both hands    Tubular adenoma of colon 03/28/2011    Past Surgical History:  Procedure Laterality Date   ARTHROSCOPIC REPAIR ACL     BALLOON DILATION  01/28/2021   Procedure: BALLOON DILATION;  Surgeon: [name], DO;  Location: [facility];  Service: Pulmonary;;   BRONCHIAL BIOPSY  01/28/2021   Procedure: BRONCHIAL BIOPSIES;  Surgeon: [name], DO;  Location: [place];  Service: Pulmonary;;   [place]   right   COLONOSCOPY WITH PROPOFOL N/A 07/25/2015   Procedure: COLONOSCOPY WITH PROPOFOL;  Surgeon: [name], MD;  Location: [facility];  Service: Endoscopy;  Laterality: N/A;   CRYOTHERAPY  01/28/2021   Procedure: CRYOTHERAPY;  Surgeon: [name], DO;  Location: [facility] [facility];  Service: Pulmonary;;   ESOPHAGOGASTRODUODENOSCOPY (EGD) WITH PROPOFOL N/A 07/25/2015   Procedure: ESOPHAGOGASTRODUODENOSCOPY (EGD) WITH PROPOFOL;  Surgeon: [name], MD;  Location: [facility];  Service: Endoscopy;  Laterality: N/A;   HEMOSTASIS CONTROL  01/28/2021   Procedure: HEMOSTASIS CONTROL;  Surgeon: [name], DO;  Location: [place];  Service: Pulmonary;;   INTRAVASCULAR PRESSURE WIRE/FFR STUDY N/A 01/22/2021   Procedure: INTRAVASCULAR PRESSURE WIRE/FFR STUDY;  Surgeon: [name], Peter M, MD;  Location: [facility];  Service: Cardiovascular;  Laterality: N/A;   IR THORACENTESIS ASP PLEURAL SPACE W/IMG GUIDE  09/17/2020   IR THORACENTESIS ASP PLEURAL SPACE W/IMG GUIDE  10/25/2020   KNEE ARTHROSCOPY  1996   LEFT HEART CATH AND CORONARY ANGIOGRAPHY N/A 01/22/2021   Procedure: LEFT HEART CATH AND CORONARY ANGIOGRAPHY;  Surgeon: [name], Peter M, MD;  Location: [facility];  Service: Cardiovascular;  Laterality: N/A;   PARTIAL COLECTOMY  2008   THYROID CYST  EXCISION     TONSILLECTOMY     VIDEO BRONCHOSCOPY Left 01/28/2021   Procedure: VIDEO BRONCHOSCOPY WITHOUT FLUORO;  Surgeon: [name], DO;  Location: [facility];  Service: Pulmonary;  Laterality: Left;    Family History  Problem Relation Age of Onset   CAD Father    Alcohol abuse Father    Heart disease Father    COPD Mother    Asthma Mother    Emphysema Mother    Cancer Maternal Grandmother    Colon cancer Neg Hx     Social History Social History   Tobacco Use   Smoking status: Former    Packs/day: 1.50    Years: 50.00    Pack years: 75.00    Types: Cigarettes    Quit date: 04/30/2015    Years since quitting: 5.7   Smokeless tobacco: Never   Tobacco comments:    States he sometimes sneaks a cigarette  Vaping Use   Vaping Use: Never used  Substance Use Topics   Alcohol use: Not Currently    Comment: once yearly   Drug use: No    Allergies  Allergen Reactions  Aspirin Other (See Comments)    GI bleed   Codeine Itching   Morphine Itching    Current Outpatient Medications  Medication Sig Dispense Refill   atorvastatin (LIPITOR) 20 MG tablet Take 20 mg by mouth at bedtime.      buPROPion (WELLBUTRIN XL) 300 MG 24 hr tablet Take 150 mg by mouth 2 (two) times daily.     calcium-vitamin D (OSCAL WITH D) 250-125 MG-UNIT per tablet Take 1 tablet by mouth daily.     carbidopa-levodopa (SINEMET IR) 25-100 MG tablet Take 1.5 tablets by mouth 3 (three) times daily. 405 tablet 3   Carbidopa-Levodopa ER (SINEMET CR) 25-100 MG tablet controlled release Take 1 tablet by mouth 2 (two) times daily. 180 tablet 3   cetirizine (ZYRTEC) 10 MG tablet Take 10 mg by mouth 2 (two) times daily.     donepezil (ARICEPT) 10 MG tablet Take 1 tablet (10 mg total) by mouth at bedtime. 90 tablet 3   ferrous gluconate (FERGON) 324 MG tablet TAKE 1 TABLET BY MOUTH DAILY WITH BREAKFAST 90 tablet 3   formoterol (PERFOROMIST) 20 MCG/2ML nebulizer solution Take 2 mLs (20 mcg total) by  nebulization 2 (two) times daily. (Patient taking differently: Take 20 mcg by nebulization daily as needed (cough).) 120 mL 5   Green Tea 315 MG CAPS Take 315 mg by mouth daily.     hydrochlorothiazide (HYDRODIURIL) 25 MG tablet Take 25 mg by mouth daily.     HYDROcodone bit-homatropine (HYCODAN) 5-1.5 MG/5ML syrup Take 5 mLs by mouth every 6 (six) hours as needed for cough. (Patient not taking: Reported on 01/29/2021) 240 mL 0   ibuprofen (ADVIL) 800 MG tablet Take 1 tablet (800 mg total) by mouth every 8 (eight) hours as needed for moderate pain. 30 tablet 1   isosorbide mononitrate (IMDUR) 60 MG 24 hr tablet Take 1 tablet (60 mg total) by mouth daily. (Patient taking differently: Take 20 mg by mouth daily.) 30 tablet 3   MEGARED OMEGA-3 KRILL OIL PO Take 1 capsule by mouth daily.     metFORMIN (GLUCOPHAGE) 500 MG tablet Take 1 tablet (500 mg total) by mouth 2 (two) times daily with a meal.     mirtazapine (REMERON) 15 MG tablet Take 15 mg by mouth at bedtime.     modafinil (PROVIGIL) 100 MG tablet Take 1 tablet (100 mg total) by mouth daily. (Patient not taking: Reported on 01/29/2021) 30 tablet 5   Multiple Vitamins-Minerals (MULTIVITAMIN WITH MINERALS) tablet Take 1 tablet by mouth daily. Centrum     nitroGLYCERIN (NITROSTAT) 0.4 MG SL tablet Place 1 tablet (0.4 mg total) under the tongue every 5 (five) minutes as needed for chest pain. 25 tablet 3   omeprazole (PRILOSEC OTC) 20 MG tablet Take 20 mg by mouth daily with breakfast.     OXYGEN Inhale 4-6 L into the lungs continuous.     revefenacin (YUPELRI) 175 MCG/3ML nebulizer solution Take 3 mLs (175 mcg total) by nebulization daily. 90 mL 5   sertraline (ZOLOFT) 100 MG tablet Take 150 mg by mouth daily.     traMADol (ULTRAM) 50 MG tablet Take 1 tablet (50 mg total) by mouth every 12 (twelve) hours as needed for moderate pain. 40 tablet 0   No current facility-administered medications for this visit.    Review of Systems  Constitutional:  positive for fatigue and weight loss Eyes: negative Ears, nose, mouth, throat, and face: negative Respiratory: positive for cough, dyspnea on exertion, pleurisy/chest pain, and  wheezing Cardiovascular: negative Gastrointestinal: positive for diarrhea and nausea Genitourinary:negative Integument/breast: negative Hematologic/lymphatic: negative Musculoskeletal:negative Neurological: negative Behavioral/Psych: negative Endocrine: negative Allergic/Immunologic: negative  Physical Exam  FAO:ZHYQM, healthy, no distress, well nourished, and well developed SKIN: skin color, texture, turgor are normal, no rashes or significant lesions HEAD: Normocephalic, No masses, lesions, tenderness or abnormalities EYES: normal, PERRLA, Conjunctiva are pink and non-injected EARS: External ears normal, Canals clear OROPHARYNX:no exudate, no erythema, and lips, buccal mucosa, and tongue normal  NECK: supple, no adenopathy, no JVD LYMPH:  no palpable lymphadenopathy, no hepatosplenomegaly LUNGS: coarse sounds heard, decreased breath sounds HEART: regular rate & rhythm, no murmurs, and no gallops ABDOMEN:abdomen soft, non-tender, normal bowel sounds, and no masses or organomegaly BACK: Back symmetric, no curvature., No CVA tenderness EXTREMITIES:no joint deformities, effusion, or inflammation, no edema  NEURO: alert & oriented x 3 with fluent speech, no focal motor/sensory deficits  PERFORMANCE STATUS: ECOG 1  LABORATORY DATA: Lab Results  Component Value Date   WBC 6.8 01/15/2021   HGB 8.4 (L) 01/15/2021   HCT 29.3 (L) 01/15/2021   MCV 71 (L) 01/15/2021   PLT 407 01/15/2021      Chemistry      Component Value Date/Time   NA 149 (H) 01/15/2021 1414   K 3.9 01/15/2021 1414   CL 104 01/15/2021 1414   CO2 29 01/15/2021 1414   BUN 12 01/15/2021 1414   CREATININE 0.84 01/15/2021 1414      Component Value Date/Time   CALCIUM 9.6 01/15/2021 1414       RADIOGRAPHIC STUDIES: CARDIAC  CATHETERIZATION  Result Date: 01/22/2021   Mid LM to Dist LM lesion is 50% stenosed.   Prox RCA to Mid RCA lesion is 25% stenosed.   The left ventricular systolic function is normal.   LV end diastolic pressure is normal.   The left ventricular ejection fraction is 55-65% by visual estimate. Nonobstructive CAD. There is a 50% distal left main stenosis but this is not hemodynamically significant by flow wire assessment. Normal LV function Normal LVEDP Plan: patient is cleared to proceed with evaluation and treatment of his lung mass. Risk factor modification.    ASSESSMENT: This is a very pleasant 75 years old white male with likely stage IIIa/IV (T3, N1, M0/M1a) non-small cell lung cancer, squamous cell carcinoma presented with central area obstructive left lung mass with probably involvement of left hilar adenopathy and suspicious but not confirmed malignant pleural effusion.  This was diagnosed in December 2022.   PLAN: I had a lengthy discussion with the patient and his wife today about his current disease stage, prognosis and treatment options. I personally and independently reviewed the scan images and discussed the result and showed the images to the patient and his wife. I recommended for the patient to complete the staging work-up by ordering MRI of the brain.  He is also scheduled to have MRI of the right hip to rule out any metastatic lesion in that area. I explained to the patient that with the locally advanced disease we may consider him for treatment with a course of concurrent chemoradiation with weekly carboplatin for AUC of 2 and paclitaxel 45 Mg/M2 for 6-7 weeks followed by immunotherapy as the patient has no evidence of disease progression. The patient also understands that he may have stage IV if the cytology from the pleural fluid becomes positive at any point or if he has any other metastatic disease on the MRI of the brain or MRI of the right hip.  I discussed with the patient the  adverse effect of the chemotherapy including but not limited to alopecia, myelosuppression, nausea and vomiting, peripheral neuropathy, liver or renal dysfunction. He is expected to start the first cycle of his treatment on February 11, 2021. He will have a chemotherapy education class before the first dose of his treatment. I will call his pharmacy with prescription for Compazine 10 mg p.o. every 6 hours as needed for nausea.  I will also give the patient prescription for tramadol for pain management.  He has allergy to morphine and codeine but mainly in the form of itching. The patient will come back for follow-up visit with the start of the first cycle of his treatment.  He was advised to call immediately if he has any other concerning symptoms in the interval. The patient voices understanding of current disease status and treatment options and is in agreement with the current care plan.  All questions were answered. The patient knows to call the clinic with any problems, questions or concerns. We can certainly see the patient much sooner if necessary.  Thank you so much for allowing me to participate in the care of [facility] I will continue to follow up the patient with you and assist in his care.  The total time spent in the appointment was 90 minutes.  Disclaimer: This note was dictated with voice recognition software. Similar sounding words can inadvertently be transcribed and may not be corrected upon review.   Eilleen Kempf February 01, 2021, 8:48 AM

## 2021-02-02 DIAGNOSIS — C3490 Malignant neoplasm of unspecified part of unspecified bronchus or lung: Secondary | ICD-10-CM | POA: Diagnosis not present

## 2021-02-04 ENCOUNTER — Telehealth: Payer: Self-pay | Admitting: Radiation Oncology

## 2021-02-04 ENCOUNTER — Ambulatory Visit
Admission: RE | Admit: 2021-02-04 | Discharge: 2021-02-04 | Disposition: A | Discharge status: Home / Self Care | Payer: Medicare Other | Source: Ambulatory Visit | Attending: Radiation Oncology | Admitting: Radiation Oncology

## 2021-02-04 ENCOUNTER — Other Ambulatory Visit: Payer: Self-pay

## 2021-02-04 DIAGNOSIS — Z87891 Personal history of nicotine dependence: Secondary | ICD-10-CM | POA: Diagnosis not present

## 2021-02-04 DIAGNOSIS — C3432 Malignant neoplasm of lower lobe, left bronchus or lung: Secondary | ICD-10-CM | POA: Diagnosis not present

## 2021-02-04 DIAGNOSIS — Z510 Encounter for antineoplastic radiation therapy: Secondary | ICD-10-CM | POA: Diagnosis not present

## 2021-02-04 NOTE — Telephone Encounter (Signed)
I called and left a voicemail with the patient's wife regarding his MRI results both of the brain and the MRI of the pelvis. I will review the pelvic MRI with Dr. Lisbeth Renshaw and let them know what the recommendations are moving forward.

## 2021-02-04 NOTE — Progress Notes (Signed)
Pharmacist Chemotherapy Monitoring - Initial Assessment    Anticipated start date: 02/11/21   The following has been reviewed per standard work regarding the patient's treatment regimen: The patient's diagnosis, treatment plan and drug doses, and organ/hematologic function Lab orders and baseline tests specific to treatment regimen  The treatment plan start date, drug sequencing, and pre-medications Prior authorization status  Patient's documented medication list, including drug-drug interaction screen and prescriptions for anti-emetics and supportive care specific to the treatment regimen The drug concentrations, fluid compatibility, administration routes, and timing of the medications to be used The patient's access for treatment and lifetime cumulative dose history, if applicable  The patient's medication allergies and previous infusion related reactions, if applicable   Changes made to treatment plan:  N/A  Follow up needed:  Pending authorization for treatment     Kennith Center, Pharm.D., CPP 02/04/2021@3 :42 PM

## 2021-02-05 ENCOUNTER — Other Ambulatory Visit: Payer: Self-pay

## 2021-02-05 ENCOUNTER — Ambulatory Visit
Admission: RE | Admit: 2021-02-05 | Discharge: 2021-02-05 | Disposition: A | Discharge status: Home / Self Care | Payer: Medicare Other | Source: Ambulatory Visit | Attending: Radiation Oncology | Admitting: Radiation Oncology

## 2021-02-05 DIAGNOSIS — Z510 Encounter for antineoplastic radiation therapy: Secondary | ICD-10-CM | POA: Diagnosis not present

## 2021-02-05 DIAGNOSIS — C3432 Malignant neoplasm of lower lobe, left bronchus or lung: Secondary | ICD-10-CM | POA: Diagnosis not present

## 2021-02-05 DIAGNOSIS — Z87891 Personal history of nicotine dependence: Secondary | ICD-10-CM | POA: Diagnosis not present

## 2021-02-05 NOTE — Progress Notes (Signed)
[place] OFFICE PROGRESS NOTE  [name], MD [address] [place] [address]  DIAGNOSIS:  stage IIIa/IV (T3, N1, M0/M1a) non-small cell lung cancer, squamous cell carcinoma presented with central area obstructive left lung mass with probably involvement of left hilar adenopathy and suspicious but not confirmed malignant pleural effusion and right hip lesion.  This was diagnosed in December 2022.  PRIOR THERAPY: None  CURRENT THERAPY: Concurrent chemoradiation with weekly carboplatin for AUC of 2 and paclitaxel 45 Mg/M2 for 6-7 weeks followed by immunotherapy as the patient has no evidence of disease progression. First dose expected on 02/20/21.   INTERVAL HISTORY: James [name] 75 y.o. male returns to the clinic today for a follow-up visit accompanied by his wife.  The patient was last seen in the clinic on 02/01/2021.  At that time, the patient was recently diagnosed with at least stage III lung cancer.  There was an indeterminant right hip lesion which an MRI of the hip was performed. This showed an indeterminate area which could represent a small metastatic lesion versus focal synovitis.  The patient's brain MRI was negative for metastatic disease to the brain.  The patient has an indeterminant pleural effusion which may or may not be malignant and the patient understands that if this is found to be malignant in the future then it would be considered stage IV.  The patient is here to start cycle #1 of treatment today. The patient is tachycardic today with a pulse of 160. He denies history of arrhythmia. He did have a cardiac catherization on 01/22/21 per patient wife report. The patient has occasional chest discomfort and shortness of breath for which he is currently on oxygen 4L via nasal canula. He localizes his chest discomfort to his sternum for which he takes 800 mg of motrin and 50 mg BID of tramadol for pain. He does not have chest pain at this  time. He saw cardiology for follow up last week without any concerns or abnormal pulse at that time. He is reporting weakness and lightheadedness with standing. The patient is here today for evaluation before starting cycle #1.   MEDICAL HISTORY: Past Medical History:  Diagnosis Date   Allergy    seasonal   Blind left eye    legally   Blood transfusion    2002   COPD (chronic obstructive pulmonary disease) (HCC)    Depression    Diabetes mellitus, type 2 (HCC)    Diverticulosis    Dyspnea    ED (erectile dysfunction)    Glaucoma    Hyperlipidemia    Hypertension    Insomnia    Internal and external bleeding hemorrhoids    Iron deficiency anemia    Osteoarthritis    Parkinson's disease ([place]) 2014   Dr. [name] PCP  and Dr. [name] at [facility]   Rosacea    Tremor of both hands    Tubular adenoma of colon 03/28/2011    ALLERGIES:  is allergic to aspirin, codeine, and morphine.  MEDICATIONS:  Current Outpatient Medications  Medication Sig Dispense Refill   atorvastatin (LIPITOR) 20 MG tablet Take 20 mg by mouth at bedtime.      buPROPion (WELLBUTRIN XL) 300 MG 24 hr tablet Take 150 mg by mouth 2 (two) times daily.     calcium-vitamin D (OSCAL WITH D) 250-125 MG-UNIT per tablet Take 1 tablet by mouth daily.     carbidopa-levodopa (SINEMET IR) 25-100 MG tablet TAKE 1.5 TABLETS BY MOUTH  3 TIMES DAILY. 405 tablet 3   cetirizine (ZYRTEC) 10 MG tablet Take 10 mg by mouth 2 (two) times daily.     donepezil (ARICEPT) 10 MG tablet Take 1 tablet (10 mg total) by mouth at bedtime. 90 tablet 3   ferrous gluconate (FERGON) 324 MG tablet TAKE 1 TABLET BY MOUTH DAILY WITH BREAKFAST 90 tablet 3   formoterol (PERFOROMIST) 20 MCG/2ML nebulizer solution Take 2 mLs (20 mcg total) by nebulization 2 (two) times daily. (Patient taking differently: Take 20 mcg by nebulization daily as needed (cough).) 120 mL 5   Green Tea 315 MG CAPS Take 315 mg by mouth daily.     hydrochlorothiazide (HYDRODIURIL) 25 MG  tablet Take 25 mg by mouth daily.     HYDROcodone bit-homatropine (HYCODAN) 5-1.5 MG/5ML syrup Take 5 mLs by mouth every 6 (six) hours as needed for cough. 240 mL 0   ibuprofen (ADVIL) 800 MG tablet Take 1 tablet (800 mg total) by mouth every 8 (eight) hours as needed for moderate pain. 30 tablet 1   MEGARED OMEGA-3 KRILL OIL PO Take 1 capsule by mouth daily.     metFORMIN (GLUCOPHAGE) 500 MG tablet Take 1 tablet (500 mg total) by mouth 2 (two) times daily with a meal.     mirtazapine (REMERON) 15 MG tablet Take 15 mg by mouth at bedtime.     modafinil (PROVIGIL) 100 MG tablet Take 1 tablet (100 mg total) by mouth daily. 30 tablet 5   Multiple Vitamins-Minerals (MULTIVITAMIN WITH MINERALS) tablet Take 1 tablet by mouth daily. Centrum     nitroGLYCERIN (NITROSTAT) 0.4 MG SL tablet Place 1 tablet (0.4 mg total) under the tongue every 5 (five) minutes as needed for chest pain. 25 tablet 3   omeprazole (PRILOSEC OTC) 20 MG tablet Take 20 mg by mouth daily with breakfast.     OXYGEN Inhale 4-6 L into the lungs continuous.     potassium chloride SA (KLOR-CON M) 20 MEQ tablet Take 1 tablet (20 mEq total) by mouth 2 (two) times daily. 14 tablet 0   prochlorperazine (COMPAZINE) 10 MG tablet Take 1 tablet (10 mg total) by mouth every 6 (six) hours as needed for nausea or vomiting. 30 tablet 0   revefenacin (YUPELRI) 175 MCG/3ML nebulizer solution Take 3 mLs (175 mcg total) by nebulization daily. 90 mL 5   sertraline (ZOLOFT) 100 MG tablet Take 150 mg by mouth daily.     traMADol (ULTRAM) 50 MG tablet Take 1 tablet (50 mg total) by mouth every 6 (six) hours as needed. 60 tablet 0   No current facility-administered medications for this visit.    SURGICAL HISTORY:  Past Surgical History:  Procedure Laterality Date   ARTHROSCOPIC REPAIR ACL     BALLOON DILATION  01/28/2021   Procedure: BALLOON DILATION;  Surgeon: [name], DO;  Location: [facility];  Service: Pulmonary;;   BRONCHIAL BIOPSY   01/28/2021   Procedure: BRONCHIAL BIOPSIES;  Surgeon: [name], DO;  Location: [facility];  Service: Pulmonary;;   [place]   right   COLONOSCOPY WITH PROPOFOL N/A 07/25/2015   Procedure: COLONOSCOPY WITH PROPOFOL;  Surgeon: [name], MD;  Location: [facility];  Service: Endoscopy;  Laterality: N/A;   CRYOTHERAPY  01/28/2021   Procedure: CRYOTHERAPY;  Surgeon: [name], DO;  Location: [facility];  Service: Pulmonary;;   ESOPHAGOGASTRODUODENOSCOPY (EGD) WITH PROPOFOL N/A 07/25/2015   Procedure: ESOPHAGOGASTRODUODENOSCOPY (EGD)  WITH PROPOFOL;  Surgeon: [name], MD;  Location: [facility];  Service: Endoscopy;  Laterality: N/A;   HEMOSTASIS CONTROL  01/28/2021   Procedure: HEMOSTASIS CONTROL;  Surgeon: [name], DO;  Location: [facility];  Service: Pulmonary;;   INTRAVASCULAR PRESSURE WIRE/FFR STUDY N/A 01/22/2021   Procedure: INTRAVASCULAR PRESSURE WIRE/FFR STUDY;  Surgeon: [name], Peter M, MD;  Location: [facility];  Service: Cardiovascular;  Laterality: N/A;   IR THORACENTESIS ASP PLEURAL SPACE W/IMG GUIDE  09/17/2020   IR THORACENTESIS ASP PLEURAL SPACE W/IMG GUIDE  10/25/2020   KNEE ARTHROSCOPY  1996   LEFT HEART CATH AND CORONARY ANGIOGRAPHY N/A 01/22/2021   Procedure: LEFT HEART CATH AND CORONARY ANGIOGRAPHY;  Surgeon: [name], Peter M, MD;  Location: [facility];  Service: Cardiovascular;  Laterality: N/A;   PARTIAL COLECTOMY  2008   THYROID CYST EXCISION     TONSILLECTOMY     VIDEO BRONCHOSCOPY Left 01/28/2021   Procedure: VIDEO BRONCHOSCOPY WITHOUT FLUORO;  Surgeon: [name], DO;  Location: [facility];  Service: Pulmonary;  Laterality: Left;    REVIEW OF SYSTEMS:   Review of Systems  Constitutional: Positive for fatigue and generalized weakness. Negative for appetite change, chills, fever and unexpected weight change.  HENT: Negative for mouth sores, nosebleeds, sore throat and  trouble swallowing.   Eyes: Negative for eye problems and icterus.  Respiratory: Positive for shortness of breath and cough. Negative for hemoptysis and wheezing.   Cardiovascular: Positive for occasional sternal chest pain. Negative for leg swelling.  Gastrointestinal: Negative for abdominal pain, constipation, diarrhea, nausea and vomiting.  Genitourinary: Negative for bladder incontinence, difficulty urinating, dysuria, frequency and hematuria.   Musculoskeletal: Negative for back pain, gait problem, neck pain and neck stiffness.  Skin: Negative for itching and rash.  Neurological: Negative for dizziness, extremity weakness, gait problem, headaches, light-headedness and seizures.  Hematological: Negative for adenopathy. Does not bruise/bleed easily.  Psychiatric/Behavioral: Negative for confusion, depression and sleep disturbance. The patient is not nervous/anxious.     PHYSICAL EXAMINATION:  Blood pressure 121/74, pulse (!) 155, temperature 98.1 F (36.7 C), temperature source Tympanic, resp. rate 17, height $RemoveBe'6\' 1"'AmOMZgaqj$  (1.854 m), weight 201 lb 3.2 oz (91.3 kg), SpO2 97 %.  ECOG PERFORMANCE STATUS: 2-3  Physical Exam  Constitutional: Oriented to person, place, and time and chronically ill appearing male, and in no distress.  HENT:  Head: Normocephalic and atraumatic.  Mouth/Throat: Oropharynx is clear and moist. No oropharyngeal exudate.  Eyes: Conjunctivae are normal. Right eye exhibits no discharge. Left eye exhibits no discharge. No scleral icterus.  Neck: Normal range of motion. Neck supple.  Cardiovascular: Tachycardic, regular rhythm, normal heart sounds and intact distal pulses.   Pulmonary/Chest: Effort normal. Positive for rhonchi. No respiratory distress. On supplemental oxygen.  Abdominal: Soft. Bowel sounds are normal. Exhibits no distension and no mass. There is no tenderness.  Musculoskeletal: Normal range of motion. Exhibits no edema.  Lymphadenopathy:    No cervical  adenopathy.  Neurological: Alert and oriented to person, place, and time. Exhibits normal muscle tone. Gait normal. Coordination normal.  Skin: Skin is warm and dry. No rash noted. Not diaphoretic. No erythema. No pallor.  Psychiatric: Mood, memory and judgment normal.  Vitals reviewed.  LABORATORY DATA: Lab Results  Component Value Date   WBC 10.8 (H) 02/11/2021   HGB 8.0 (L) 02/11/2021   HCT 29.0 (L) 02/11/2021   MCV 70.4 (L) 02/11/2021   PLT 524 (H) 02/11/2021  Chemistry      Component Value Date/Time   NA 142 02/11/2021 1121   NA 149 (H) 01/15/2021 1414   K 3.7 02/11/2021 1121   CL 103 02/11/2021 1121   CO2 27 02/11/2021 1121   BUN 13 02/11/2021 1121   BUN 12 01/15/2021 1414   CREATININE 0.90 02/11/2021 1121      Component Value Date/Time   CALCIUM 9.7 02/11/2021 1121   ALKPHOS 74 02/11/2021 1121   AST 9 (L) 02/11/2021 1121   ALT <5 02/11/2021 1121   BILITOT 0.4 02/11/2021 1121       RADIOGRAPHIC STUDIES:  MR Brain W Wo Contrast  Result Date: 02/02/2021 CLINICAL DATA:  Initial evaluation for non-small cell lung cancer, staging. EXAM: MRI HEAD WITHOUT AND WITH CONTRAST TECHNIQUE: Multiplanar, multiecho pulse sequences of the brain and surrounding structures were obtained without and with intravenous contrast. CONTRAST:  76mL GADAVIST GADOBUTROL 1 MMOL/ML IV SOLN COMPARISON:  Prior PET-CT from 12/26/2020. FINDINGS: Brain: Generalized age-related cerebral atrophy. Scattered patchy T2/FLAIR hyperintensity involving the periventricular and deep white matter both cerebral hemispheres as well as the pons, most consistent with chronic small vessel ischemic disease, mild in nature. No evidence for acute or subacute infarct. Gray-white matter differentiation maintained. No encephalomalacia to suggest chronic cortical infarction. No evidence for acute or chronic intracranial hemorrhage. No mass lesion, midline shift or mass effect. Mild ventricular prominence related global  parenchymal volume loss without hydrocephalus. Pituitary gland suprasellar region within normal limits. Midline structures intact and normal. No abnormal enhancement or evidence for intracranial metastatic disease. Vascular: Major intracranial vascular flow voids are maintained. Skull and upper cervical spine: Craniocervical junction within normal limits. Bone marrow signal intensity normal. No visible focal marrow replacing lesion. No scalp soft tissue abnormality. Sinuses/Orbits: Patient status post bilateral ocular lens replacement. Globes and orbital soft tissues demonstrate no acute finding. Scattered mucosal thickening noted throughout the paranasal sinuses. Trace left mastoid effusion noted, of doubtful significance. Other: None. IMPRESSION: 1. No evidence for intracranial metastatic disease. 2. Mild age-related cerebral atrophy with chronic small vessel ischemic disease. Electronically Signed   By: Jeannine Boga M.D.   On: 02/02/2021 02:41   MR Pelvis W Wo Contrast  Result Date: 02/02/2021 CLINICAL DATA:  Lung cancer. Abnormal PET-CT with focal hypermetabolism anterior to the right femoral neck. EXAM: MRI PELVIS WITHOUT AND WITH CONTRAST TECHNIQUE: Multiplanar multisequence MR imaging of the pelvis was performed both before and after administration of intravenous contrast. CONTRAST:  20mL GADAVIST GADOBUTROL 1 MMOL/ML IV SOLN COMPARISON:  PET-CT 12/26/2020 FINDINGS: Bones/Joint/Cartilage No acute fracture. No dislocation. No femoral head avascular necrosis. Bony pelvis intact without diastasis. Mild chondral thinning of both hip joints. No hip joint effusion. SI joints and pubic symphysis within normal limits. No bone marrow edema. No marrow replacing bone lesion. Ligaments Intact. Muscles and Tendons The bilateral gluteal, hamstring, iliopsoas, rectus femoris, and adductor tendons appear intact without tear or significant tendinosis. Normal muscle bulk and signal intensity without edema,  atrophy, or fatty infiltration. Soft tissues Adjacent to the anterior margin of the right femoral neck is a small nodular focal enhancing lesion measuring 1.9 x 1.1 x 1.4 cm (series 10, image 26; series 11, image 20). Lesion is intermediate to low signal on both T1 and T2 weighted imaging. It is difficult determine whether this lesion is intra-articular or extra-articular on large field-of-view imaging of the pelvis and lack of intra-articular fluid within the right hip joint. No additional enhancing masses are identified. Incidentally noted 2.1 cm  subcutaneous cyst along the anterior lower abdominopelvic wall, left of midline, likely sebaceous cyst. Soft tissues are otherwise within normal limits. No soft tissue edema. No pelvic or inguinal lymphadenopathy. IMPRESSION: 1. Adjacent to the anterior margin of the right femoral neck is a small nodular focal enhancing lesion measuring 1.9 x 1.1 x 1.4 cm. It is difficult determine whether this lesion is intra-articular or extra-articular on large field-of-view imaging of the pelvis and lack of intra-articular fluid within the right hip joint. Differential considerations include a small metastatic lesion versus focal synovitis. 2. No acute osseous findings. No evidence of osseous metastatic disease. 3. Mild bilateral hip osteoarthritis. Electronically Signed   By: Davina Poke D.O.   On: 02/02/2021 11:10   CARDIAC CATHETERIZATION  Result Date: 01/22/2021   Mid LM to Dist LM lesion is 50% stenosed.   Prox RCA to Mid RCA lesion is 25% stenosed.   The left ventricular systolic function is normal.   LV end diastolic pressure is normal.   The left ventricular ejection fraction is 55-65% by visual estimate. Nonobstructive CAD. There is a 50% distal left main stenosis but this is not hemodynamically significant by flow wire assessment. Normal LV function Normal LVEDP Plan: patient is cleared to proceed with evaluation and treatment of his lung mass. Risk factor  modification.     ASSESSMENT/PLAN:  This is a very pleasant 75 year old Caucasian male with likely stage IIIa/stage IV (T3, N1, M0/M1a) non-small cell lung cancer, squamous cell carcinoma.  He presented with a central area obstructive left lung mass with probable involvement of the left hilar adenopathy and suspicious but not confirmed malignant pleural effusion.  The patient was diagnosed in December 2022.  PD-L1 expression pending.   The patient underwent an MRI of the right hip which was found to show indeterminate small metastatic lesion vs. Focal synovitis. The plan is to continue with treatment as planned with close monitoring of this area.    Mohamed still recommends undergoing giving the patient the benefit of the doubt and undergoing weekly concurrent chemoradiation with carboplatin for AUC of 2 and paclitaxel 45 mg per metered square.  He is expected to start his first cycle of treatment today. However, his pulse is 160 today.   An EKG was performed which continues to show tachycardia with a pulse of 160 bpm. The EKG read shows atrial flutter. He denies cardiac arrhythmia history. His wife tells me he had a cardiac cath on 01/22/21. He is feeling short of breath, weak, and lightheaded upon standing. No chest pain today although he intermittently has sternal chest discomfort for which he takes tramadol 50 mg BID and motrin 800 mg daily.   Given his tachycardia, we will not proceed with his first cycle of treatment today. We will send him to the ER for evaluation and close monitoring. We will reschedule him to tentatively start his first cycle of chemotherapy next week on 02/20/21. We will see him that day for evaluation and to ensure it is safe to proceed with treatment that day as scheduled.   The patient was seen with Dr. Julien Nordmann today.  Dr. Julien Nordmann had a lengthy discussion with the patient today about his current condition and recommended treatment options.  Dr. Julien Nordmann recommends that the  patient proceed with cycle #1 today scheduled  We will see the patient back for follow-up visit in 1 weeks for evaluation before starting cycle #1. His Hbg is low today. Will arrange for sample to blood next week incase he  needs a blood transfusion at that time as well. He will likely receive a blood transfusion while admitted to the hospital.   The patient was advised to call immediately if he has any concerning symptoms in the interval. The patient voices understanding of current disease status and treatment options and is in agreement with the current care plan. All questions were answered. The patient knows to call the clinic with any problems, questions or concerns. We can certainly see the patient much sooner if necessary     Orders Placed This Encounter  Procedures   EKG 12-Lead    Ordered by Ascencion Coye PA-C    EKG 12-Lead    Ordered by Enos Muhl PA-C    Sample to Blood Bank    Standing Status:   Standing    Number of Occurrences:   2    Standing Expiration Date:   02/11/2022    The total time spent in the appointment was 30-39 minutes in this encounter  Luwana Butrick L Iylah Dworkin, PA-C 02/11/21

## 2021-02-05 NOTE — Progress Notes (Signed)
Cardiology Office Note   Date:  02/06/2021   ID:  Cassiel, Fernandez 11-01-1945, MRN [id number]  PCP:  [name], MD  Cardiologist:   Minus Breeding, MD   Chief Complaint  Patient presents with   Chest Pain       History of Present Illness: James [name] is a 75 y.o. male who presents for evaluation of chest pain.  He was being seen prior to a bronch and possible debulking of a lung mass.  He had chest pain and had a cardiac cath with non obstructive CAD as below. 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   He has a diagnostic thoracentesis pending.    He had no evidence of mets on brain MRI this week but there is a hip lesion that is in question on MRI.      He says that he still getting the chest discomfort but it is felt to be related to his lung cancer.  He is weak.  He has chronic shortness of breath and is on oxygen.  He has just started radiation in the past week and is going to start chemotherapy with an eye towards possible resection is what he tells me.  He denies any new cardiovascular symptoms.  He is not really describing PND or orthopnea.   Past Medical History:  Diagnosis Date   Allergy    seasonal   Blind left eye    legally   Blood transfusion    2002   COPD (chronic obstructive pulmonary disease) (HCC)    Depression    Diabetes mellitus, type 2 (HCC)    Diverticulosis    Dyspnea    ED (erectile dysfunction)    Glaucoma    Hyperlipidemia    Hypertension    Insomnia    Internal and external bleeding hemorrhoids    Iron deficiency anemia    Osteoarthritis    Parkinson's disease ([place]) 2014   Dr. [name] PCP  and Dr. [name] at [facility]   Rosacea    Tremor of both hands    Tubular adenoma of colon 03/28/2011    Past Surgical History:  Procedure Laterality Date   ARTHROSCOPIC REPAIR ACL     BALLOON DILATION  01/28/2021   Procedure: BALLOON DILATION;  Surgeon: [name], DO;  Location: [facility];   Service: Pulmonary;;   BRONCHIAL BIOPSY  01/28/2021   Procedure: BRONCHIAL BIOPSIES;  Surgeon: [name], DO;  Location: [place];  Service: Pulmonary;;   [place]   right   COLONOSCOPY WITH PROPOFOL N/A 07/25/2015   Procedure: COLONOSCOPY WITH PROPOFOL;  Surgeon: [name], MD;  Location: [facility];  Service: Endoscopy;  Laterality: N/A;   CRYOTHERAPY  01/28/2021   Procedure: CRYOTHERAPY;  Surgeon: [name], DO;  Location: [place];  Service: Pulmonary;;   ESOPHAGOGASTRODUODENOSCOPY (EGD) WITH PROPOFOL N/A 07/25/2015   Procedure: ESOPHAGOGASTRODUODENOSCOPY (EGD) WITH PROPOFOL;  Surgeon: [name], MD;  Location: [facility];  Service: Endoscopy;  Laterality: N/A;   HEMOSTASIS CONTROL  01/28/2021   Procedure: HEMOSTASIS CONTROL;  Surgeon: [name], DO;  Location: [place];  Service: Pulmonary;;   INTRAVASCULAR PRESSURE WIRE/FFR STUDY N/A 01/22/2021   Procedure: INTRAVASCULAR PRESSURE WIRE/FFR STUDY;  Surgeon: [name], Peter M, MD;  Location: [facility];  Service: Cardiovascular;  Laterality: N/A;   IR THORACENTESIS ASP PLEURAL SPACE W/IMG GUIDE  09/17/2020  IR THORACENTESIS ASP PLEURAL SPACE W/IMG GUIDE  10/25/2020   KNEE ARTHROSCOPY  1996   LEFT HEART CATH AND CORONARY ANGIOGRAPHY N/A 01/22/2021   Procedure: LEFT HEART CATH AND CORONARY ANGIOGRAPHY;  Surgeon: [name], Peter M, MD;  Location: [place];  Service: Cardiovascular;  Laterality: N/A;   PARTIAL COLECTOMY  2008   THYROID CYST EXCISION     TONSILLECTOMY     VIDEO BRONCHOSCOPY Left 01/28/2021   Procedure: VIDEO BRONCHOSCOPY WITHOUT FLUORO;  Surgeon: [name], DO;  Location: [place];  Service: Pulmonary;  Laterality: Left;     Current Outpatient Medications  Medication Sig Dispense Refill   atorvastatin (LIPITOR) 20 MG tablet Take 20 mg by mouth at bedtime.      buPROPion (WELLBUTRIN XL) 300 MG 24 hr tablet Take 150  mg by mouth 2 (two) times daily.     calcium-vitamin D (OSCAL WITH D) 250-125 MG-UNIT per tablet Take 1 tablet by mouth daily.     carbidopa-levodopa (SINEMET IR) 25-100 MG tablet TAKE 1.5 TABLETS BY MOUTH 3 TIMES DAILY. 405 tablet 3   cetirizine (ZYRTEC) 10 MG tablet Take 10 mg by mouth 2 (two) times daily.     donepezil (ARICEPT) 10 MG tablet Take 1 tablet (10 mg total) by mouth at bedtime. 90 tablet 3   ferrous gluconate (FERGON) 324 MG tablet TAKE 1 TABLET BY MOUTH DAILY WITH BREAKFAST 90 tablet 3   formoterol (PERFOROMIST) 20 MCG/2ML nebulizer solution Take 2 mLs (20 mcg total) by nebulization 2 (two) times daily. (Patient taking differently: Take 20 mcg by nebulization daily as needed (cough).) 120 mL 5   Green Tea 315 MG CAPS Take 315 mg by mouth daily.     hydrochlorothiazide (HYDRODIURIL) 25 MG tablet Take 25 mg by mouth daily.     HYDROcodone bit-homatropine (HYCODAN) 5-1.5 MG/5ML syrup Take 5 mLs by mouth every 6 (six) hours as needed for cough. 240 mL 0   ibuprofen (ADVIL) 800 MG tablet Take 1 tablet (800 mg total) by mouth every 8 (eight) hours as needed for moderate pain. 30 tablet 1   MEGARED OMEGA-3 KRILL OIL PO Take 1 capsule by mouth daily.     metFORMIN (GLUCOPHAGE) 500 MG tablet Take 1 tablet (500 mg total) by mouth 2 (two) times daily with a meal.     mirtazapine (REMERON) 15 MG tablet Take 15 mg by mouth at bedtime.     modafinil (PROVIGIL) 100 MG tablet Take 1 tablet (100 mg total) by mouth daily. 30 tablet 5   Multiple Vitamins-Minerals (MULTIVITAMIN WITH MINERALS) tablet Take 1 tablet by mouth daily. Centrum     nitroGLYCERIN (NITROSTAT) 0.4 MG SL tablet Place 1 tablet (0.4 mg total) under the tongue every 5 (five) minutes as needed for chest pain. 25 tablet 3   omeprazole (PRILOSEC OTC) 20 MG tablet Take 20 mg by mouth daily with breakfast.     OXYGEN Inhale 4-6 L into the lungs continuous.     potassium chloride SA (KLOR-CON M) 20 MEQ tablet Take 1 tablet (20 mEq total)  by mouth 2 (two) times daily. 14 tablet 0   prochlorperazine (COMPAZINE) 10 MG tablet Take 1 tablet (10 mg total) by mouth every 6 (six) hours as needed for nausea or vomiting. 30 tablet 0   revefenacin (YUPELRI) 175 MCG/3ML nebulizer solution Take 3 mLs (175 mcg total) by nebulization daily. 90 mL 5   sertraline (ZOLOFT) 100 MG tablet Take 150 mg by mouth daily.  traMADol (ULTRAM) 50 MG tablet Take 1 tablet (50 mg total) by mouth every 6 (six) hours as needed. 60 tablet 0   No current facility-administered medications for this visit.    Allergies:   Aspirin, Codeine, and Morphine    ROS:  Please see the history of present illness.   Otherwise, review of systems are positive for none.   All other systems are reviewed and negative.    PHYSICAL EXAM: VS:  BP 110/60 (BP Location: Left Arm, Patient Position: Sitting, Cuff Size: Normal)    Pulse 80    Ht 6\' 1"  (1.854 m)    Wt 202 lb (91.6 kg)    SpO2 98%    BMI 26.65 kg/m  , BMI Body mass index is 26.65 kg/m. GEN:  No distress, chronically ill-appearing NECK:  No jugular venous distention at 90 degrees, waveform within normal limits, carotid upstroke brisk and symmetric, no bruits, no thyromegaly LYMPHATICS:  No cervical adenopathy LUNGS: Decreased breath sounds bilaterally without wheezing or rhonchi BACK:  No CVA tenderness CHEST:  Unremarkable HEART:  S1 and S2 within normal limits, no S3, no S4, no clicks, no rubs, no murmurs ABD:  Positive bowel sounds normal in frequency in pitch, no bruits, no rebound, no guarding, unable to assess midline mass or bruit with the patient seated. EXT:  2 plus pulses throughout, no edema, no cyanosis no clubbing SKIN:  No rashes no nodules NEURO:  Cranial nerves II through XII grossly intact, motor grossly intact throughout PSYCH:  Cognitively intact, oriented to person place and time   EKG:  EKG is not ordered today.  CATH     Recent Labs: 02/01/2021: ALT <5; BUN 10; Creatinine 0.77;  Hemoglobin 7.5; Platelet Count 355; Potassium 2.9; Sodium 143    Lipid Panel No results found for: CHOL, TRIG, HDL, CHOLHDL, VLDL, LDLCALC, LDLDIRECT    Wt Readings from Last 3 Encounters:  02/06/21 202 lb (91.6 kg)  02/01/21 212 lb (96.2 kg)  01/29/21 214 lb (97.1 kg)      Other studies Reviewed: Additional studies/ records that were reviewed today include: Oncology records, cardiac cath. Review of the above records demonstrates:  Please see elsewhere in the note.     ASSESSMENT AND PLAN:  Precordial chest pain: There is no indication that this is cardiac.  Is probably related to the lung cancer.  He has nonobstructive coronary disease.  I have suggested he stop his Imdur.  No further cardiovascular testing is suggested.  I think they can return as needed.  Lung mass: He is acceptable risk for the planned therapies which if necessary includes any lung surgeries.      Current medicines are reviewed at length with the patient today.  The patient does not have concerns regarding medicines.  The following changes have been made:  no change  Labs/ tests ordered today include: None No orders of the defined types were placed in this encounter.    Disposition:   FU as needed.      Signed, Minus Breeding, MD  02/06/2021 12:14 PM    [facility]

## 2021-02-06 ENCOUNTER — Encounter: Payer: Self-pay | Admitting: Cardiology

## 2021-02-06 ENCOUNTER — Telehealth: Payer: Self-pay | Admitting: Radiation Oncology

## 2021-02-06 ENCOUNTER — Ambulatory Visit (INDEPENDENT_AMBULATORY_CARE_PROVIDER_SITE_OTHER): Payer: Medicare Other | Admitting: Cardiology

## 2021-02-06 ENCOUNTER — Ambulatory Visit
Admission: RE | Admit: 2021-02-06 | Discharge: 2021-02-06 | Disposition: A | Discharge status: Home / Self Care | Payer: Medicare Other | Source: Ambulatory Visit | Attending: Radiation Oncology | Admitting: Radiation Oncology

## 2021-02-06 ENCOUNTER — Inpatient Hospital Stay: Payer: Medicare Other

## 2021-02-06 ENCOUNTER — Other Ambulatory Visit: Payer: Medicare Other

## 2021-02-06 VITALS — BP 110/60 | HR 80 | Ht 73.0 in | Wt 202.0 lb

## 2021-02-06 DIAGNOSIS — R079 Chest pain, unspecified: Secondary | ICD-10-CM | POA: Diagnosis not present

## 2021-02-06 DIAGNOSIS — C3432 Malignant neoplasm of lower lobe, left bronchus or lung: Secondary | ICD-10-CM | POA: Diagnosis not present

## 2021-02-06 DIAGNOSIS — Z510 Encounter for antineoplastic radiation therapy: Secondary | ICD-10-CM | POA: Diagnosis not present

## 2021-02-06 DIAGNOSIS — Z87891 Personal history of nicotine dependence: Secondary | ICD-10-CM | POA: Diagnosis not present

## 2021-02-06 DIAGNOSIS — I240 Acute coronary thrombosis not resulting in myocardial infarction: Secondary | ICD-10-CM

## 2021-02-06 NOTE — Patient Instructions (Signed)
Medication Instructions:  Stop taking Imdur *If you need a refill on your cardiac medications before your next appointment, please call your pharmacy*  Follow-Up: At [facility], you and your health needs are our priority.  As part of our continuing mission to provide you with exceptional heart care, we have created designated Provider Care Teams.  These Care Teams include your primary Cardiologist (physician) and Advanced Practice Providers (APPs -  Physician Assistants and Nurse Practitioners) who all work together to provide you with the care you need, when you need it.  We recommend signing up for the patient portal called "MyChart".  Sign up information is provided on this After Visit Summary.  MyChart is used to connect with patients for Virtual Visits (Telemedicine).  Patients are able to view lab/test results, encounter notes, upcoming appointments, etc.  Non-urgent messages can be sent to your provider as well.   To learn more about what you can do with MyChart, go to [email or web address].

## 2021-02-06 NOTE — Telephone Encounter (Signed)
-----   Message from Tyler Pita, MD sent at 02/06/2021 11:57 AM EST ----- Very interesting.  So, it is a small enhancing lesion that is hypermetabolic, could be a metastasis versus focal synovitis.  It is a very unusual location for a metastasis.  In my mind, we ruled out a femoral neck metastasis that could be a fracture risk.  So, I would give him the benefit of the doubt that it is a benign process, but, monitor.   ----- Message ----- From: Hayden Pedro, PA-C Sent: 02/05/2021   8:56 AM EST To: Tyler Pita, MD, Ashlyn Bruning, PA-C  Good morning- would you take a look at this MRI? I'm not sure if you think it needs to be biopsied? ----- Message ----- From: Interface, Rad Results In Sent: 02/02/2021  11:12 AM EST To: Hayden Pedro, PA-C

## 2021-02-06 NOTE — Telephone Encounter (Signed)
I called and spoke with the patient's wife. His MRI pelvis showed a lesion near the right femur. Dr. Tammi Klippel reviewed the films and prior imaging and felt that this should be watched closely but not felt that it was definitive for malignancy. The patient's wife was pleased and he will proceed with chemoRT to his chest.

## 2021-02-07 ENCOUNTER — Other Ambulatory Visit: Payer: Self-pay

## 2021-02-07 ENCOUNTER — Ambulatory Visit
Admission: RE | Admit: 2021-02-07 | Discharge: 2021-02-07 | Disposition: A | Discharge status: Home / Self Care | Payer: Medicare Other | Source: Ambulatory Visit | Attending: Radiation Oncology | Admitting: Radiation Oncology

## 2021-02-07 DIAGNOSIS — Z87891 Personal history of nicotine dependence: Secondary | ICD-10-CM | POA: Diagnosis not present

## 2021-02-07 DIAGNOSIS — Z510 Encounter for antineoplastic radiation therapy: Secondary | ICD-10-CM | POA: Diagnosis not present

## 2021-02-07 DIAGNOSIS — C3432 Malignant neoplasm of lower lobe, left bronchus or lung: Secondary | ICD-10-CM | POA: Diagnosis not present

## 2021-02-08 ENCOUNTER — Encounter: Payer: Self-pay | Admitting: *Deleted

## 2021-02-08 ENCOUNTER — Ambulatory Visit
Admission: RE | Admit: 2021-02-08 | Discharge: 2021-02-08 | Disposition: A | Discharge status: Home / Self Care | Payer: Medicare Other | Source: Ambulatory Visit | Attending: Radiation Oncology | Admitting: Radiation Oncology

## 2021-02-08 ENCOUNTER — Encounter: Payer: Self-pay | Admitting: Internal Medicine

## 2021-02-08 ENCOUNTER — Other Ambulatory Visit: Payer: Self-pay

## 2021-02-08 DIAGNOSIS — Z510 Encounter for antineoplastic radiation therapy: Secondary | ICD-10-CM | POA: Diagnosis not present

## 2021-02-08 DIAGNOSIS — Z87891 Personal history of nicotine dependence: Secondary | ICD-10-CM | POA: Diagnosis not present

## 2021-02-08 DIAGNOSIS — C3492 Malignant neoplasm of unspecified part of left bronchus or lung: Secondary | ICD-10-CM

## 2021-02-08 DIAGNOSIS — C3432 Malignant neoplasm of lower lobe, left bronchus or lung: Secondary | ICD-10-CM | POA: Diagnosis not present

## 2021-02-08 MED FILL — Dexamethasone Sodium Phosphate Inj 100 MG/10ML: INTRAMUSCULAR | Qty: 1 | Status: AC

## 2021-02-08 NOTE — Progress Notes (Signed)
Called pt to introduce myself as his Arboriculturist and to discuss the [employer].  Unfortunately there aren't any foundations offering copay assistance for his Dx and the type of ins he has.  I spoke to his wife because pt wasn't feeling well so I informed her of the [employer], went over what it covers and gave her the income requirement.  She gave me their income and he is overqualified for the grant at this time.

## 2021-02-08 NOTE — Progress Notes (Signed)
Oncology Nurse Navigator Documentation  Oncology Nurse Navigator Flowsheets 02/08/2021 02/01/2021 01/29/2021  Abnormal Finding Date - - 10/25/2020  Confirmed Diagnosis Date - - 01/28/2021  Diagnosis Status - Confirmed Diagnosis Complete Pathology Pending  Planned Course of Treatment - Chemo/Radiation Concurrent -  Phase of Treatment Radiation Radiation -  Chemotherapy Actual Start Date: 02/11/2021 - -  Radiation Actual Start Date: 02/04/2021 - -  Navigator Follow Up Date: 02/26/2021 02/05/2021 02/01/2021  Navigator Follow Up Reason: Follow-up Appointment Appointment Review New Patient Appointment  Navigator Location [place]  Referral Date to RadOnc/MedOnc - - 01/28/2021  Navigator Encounter Type Other:;Appt/Treatment Plan Review Initial MedOnc Other:  Treatment Initiated Date 02/04/2021 - -  Patient Visit Type Other Initial;MedOnc Other  Treatment Phase Pre-Tx/Tx Discussion Pre-Tx/Tx Discussion Pre-Tx/Tx Discussion  Barriers/Navigation Needs Coordination of Care Coordination of Care;Education Coordination of Care  Education - Newly Diagnosed Cancer Education;Other -  Interventions Coordination of Care/I followed up on Mr. Herter's treatment plan schedule. He is set up with radiation and will receive 1st infusion on 12/19.  MRI brain has been completed.   Coordination of Care;Psycho-Social Support;Education Coordination of Care  Acuity Level 2-Minimal Needs (1-2 Barriers Identified) Level 4-High Needs (Greater Than 4 Barriers Identified) Level 3-Moderate Needs (3-4 Barriers Identified)  Coordination of Care Other Appts Other  Education Method - Verbal;Written -  Time Spent with Patient [phone]

## 2021-02-11 ENCOUNTER — Other Ambulatory Visit: Payer: Self-pay

## 2021-02-11 ENCOUNTER — Inpatient Hospital Stay (HOSPITAL_COMMUNITY)
Admission: EM | Admit: 2021-02-11 | Discharge: 2021-02-16 | DRG: 177 | Disposition: A | Discharge status: Home Health | Payer: Medicare Other | Attending: Internal Medicine | Admitting: Internal Medicine

## 2021-02-11 ENCOUNTER — Other Ambulatory Visit: Payer: Medicare Other

## 2021-02-11 ENCOUNTER — Ambulatory Visit: Payer: Medicare Other

## 2021-02-11 ENCOUNTER — Inpatient Hospital Stay: Payer: Medicare Other

## 2021-02-11 ENCOUNTER — Emergency Department (HOSPITAL_COMMUNITY): Payer: Medicare Other

## 2021-02-11 ENCOUNTER — Inpatient Hospital Stay (HOSPITAL_BASED_OUTPATIENT_CLINIC_OR_DEPARTMENT_OTHER): Payer: Medicare Other | Admitting: Physician Assistant

## 2021-02-11 ENCOUNTER — Encounter (HOSPITAL_COMMUNITY): Payer: Self-pay | Admitting: Emergency Medicine

## 2021-02-11 VITALS — BP 121/74 | HR 155 | Temp 98.1°F | Resp 17 | Ht 73.0 in | Wt 201.2 lb

## 2021-02-11 DIAGNOSIS — E1165 Type 2 diabetes mellitus with hyperglycemia: Secondary | ICD-10-CM | POA: Diagnosis not present

## 2021-02-11 DIAGNOSIS — I4892 Unspecified atrial flutter: Secondary | ICD-10-CM | POA: Diagnosis present

## 2021-02-11 DIAGNOSIS — H5462 Unqualified visual loss, left eye, normal vision right eye: Secondary | ICD-10-CM | POA: Diagnosis present

## 2021-02-11 DIAGNOSIS — Z886 Allergy status to analgesic agent status: Secondary | ICD-10-CM

## 2021-02-11 DIAGNOSIS — G20 Parkinson's disease: Secondary | ICD-10-CM | POA: Diagnosis present

## 2021-02-11 DIAGNOSIS — I10 Essential (primary) hypertension: Secondary | ICD-10-CM | POA: Diagnosis present

## 2021-02-11 DIAGNOSIS — K219 Gastro-esophageal reflux disease without esophagitis: Secondary | ICD-10-CM | POA: Diagnosis present

## 2021-02-11 DIAGNOSIS — E785 Hyperlipidemia, unspecified: Secondary | ICD-10-CM | POA: Diagnosis present

## 2021-02-11 DIAGNOSIS — D649 Anemia, unspecified: Secondary | ICD-10-CM | POA: Diagnosis not present

## 2021-02-11 DIAGNOSIS — I440 Atrioventricular block, first degree: Secondary | ICD-10-CM | POA: Diagnosis present

## 2021-02-11 DIAGNOSIS — D509 Iron deficiency anemia, unspecified: Secondary | ICD-10-CM | POA: Diagnosis present

## 2021-02-11 DIAGNOSIS — D849 Immunodeficiency, unspecified: Secondary | ICD-10-CM | POA: Diagnosis present

## 2021-02-11 DIAGNOSIS — E441 Mild protein-calorie malnutrition: Secondary | ICD-10-CM | POA: Diagnosis present

## 2021-02-11 DIAGNOSIS — G4700 Insomnia, unspecified: Secondary | ICD-10-CM | POA: Diagnosis present

## 2021-02-11 DIAGNOSIS — J439 Emphysema, unspecified: Secondary | ICD-10-CM | POA: Diagnosis present

## 2021-02-11 DIAGNOSIS — I2510 Atherosclerotic heart disease of native coronary artery without angina pectoris: Secondary | ICD-10-CM | POA: Diagnosis present

## 2021-02-11 DIAGNOSIS — J9621 Acute and chronic respiratory failure with hypoxia: Secondary | ICD-10-CM | POA: Diagnosis present

## 2021-02-11 DIAGNOSIS — R7989 Other specified abnormal findings of blood chemistry: Secondary | ICD-10-CM | POA: Diagnosis not present

## 2021-02-11 DIAGNOSIS — Z87891 Personal history of nicotine dependence: Secondary | ICD-10-CM | POA: Diagnosis not present

## 2021-02-11 DIAGNOSIS — Z825 Family history of asthma and other chronic lower respiratory diseases: Secondary | ICD-10-CM

## 2021-02-11 DIAGNOSIS — D500 Iron deficiency anemia secondary to blood loss (chronic): Secondary | ICD-10-CM | POA: Diagnosis not present

## 2021-02-11 DIAGNOSIS — E43 Unspecified severe protein-calorie malnutrition: Secondary | ICD-10-CM

## 2021-02-11 DIAGNOSIS — E46 Unspecified protein-calorie malnutrition: Secondary | ICD-10-CM

## 2021-02-11 DIAGNOSIS — F32A Depression, unspecified: Secondary | ICD-10-CM | POA: Diagnosis present

## 2021-02-11 DIAGNOSIS — D630 Anemia in neoplastic disease: Secondary | ICD-10-CM | POA: Diagnosis present

## 2021-02-11 DIAGNOSIS — Z7984 Long term (current) use of oral hypoglycemic drugs: Secondary | ICD-10-CM

## 2021-02-11 DIAGNOSIS — U071 COVID-19: Secondary | ICD-10-CM | POA: Diagnosis present

## 2021-02-11 DIAGNOSIS — R946 Abnormal results of thyroid function studies: Secondary | ICD-10-CM | POA: Diagnosis present

## 2021-02-11 DIAGNOSIS — Z8249 Family history of ischemic heart disease and other diseases of the circulatory system: Secondary | ICD-10-CM | POA: Diagnosis not present

## 2021-02-11 DIAGNOSIS — R0602 Shortness of breath: Secondary | ICD-10-CM | POA: Diagnosis not present

## 2021-02-11 DIAGNOSIS — F419 Anxiety disorder, unspecified: Secondary | ICD-10-CM | POA: Diagnosis present

## 2021-02-11 DIAGNOSIS — I484 Atypical atrial flutter: Secondary | ICD-10-CM | POA: Diagnosis present

## 2021-02-11 DIAGNOSIS — H409 Unspecified glaucoma: Secondary | ICD-10-CM | POA: Diagnosis present

## 2021-02-11 DIAGNOSIS — J302 Other seasonal allergic rhinitis: Secondary | ICD-10-CM | POA: Diagnosis present

## 2021-02-11 DIAGNOSIS — T380X5A Adverse effect of glucocorticoids and synthetic analogues, initial encounter: Secondary | ICD-10-CM | POA: Diagnosis not present

## 2021-02-11 DIAGNOSIS — E860 Dehydration: Secondary | ICD-10-CM | POA: Diagnosis present

## 2021-02-11 DIAGNOSIS — I493 Ventricular premature depolarization: Secondary | ICD-10-CM | POA: Diagnosis present

## 2021-02-11 DIAGNOSIS — R079 Chest pain, unspecified: Secondary | ICD-10-CM | POA: Diagnosis not present

## 2021-02-11 DIAGNOSIS — C3492 Malignant neoplasm of unspecified part of left bronchus or lung: Secondary | ICD-10-CM

## 2021-02-11 DIAGNOSIS — C3432 Malignant neoplasm of lower lobe, left bronchus or lung: Secondary | ICD-10-CM | POA: Diagnosis not present

## 2021-02-11 DIAGNOSIS — R531 Weakness: Secondary | ICD-10-CM | POA: Diagnosis not present

## 2021-02-11 DIAGNOSIS — R000 Tachycardia, unspecified: Secondary | ICD-10-CM | POA: Diagnosis not present

## 2021-02-11 DIAGNOSIS — Z79899 Other long term (current) drug therapy: Secondary | ICD-10-CM

## 2021-02-11 DIAGNOSIS — Z6826 Body mass index (BMI) 26.0-26.9, adult: Secondary | ICD-10-CM

## 2021-02-11 DIAGNOSIS — E042 Nontoxic multinodular goiter: Secondary | ICD-10-CM | POA: Diagnosis not present

## 2021-02-11 DIAGNOSIS — L719 Rosacea, unspecified: Secondary | ICD-10-CM | POA: Diagnosis present

## 2021-02-11 DIAGNOSIS — Z9981 Dependence on supplemental oxygen: Secondary | ICD-10-CM

## 2021-02-11 DIAGNOSIS — E1169 Type 2 diabetes mellitus with other specified complication: Secondary | ICD-10-CM | POA: Diagnosis present

## 2021-02-11 DIAGNOSIS — E119 Type 2 diabetes mellitus without complications: Secondary | ICD-10-CM

## 2021-02-11 DIAGNOSIS — Z9049 Acquired absence of other specified parts of digestive tract: Secondary | ICD-10-CM

## 2021-02-11 DIAGNOSIS — M160 Bilateral primary osteoarthritis of hip: Secondary | ICD-10-CM | POA: Diagnosis present

## 2021-02-11 DIAGNOSIS — Z885 Allergy status to narcotic agent status: Secondary | ICD-10-CM

## 2021-02-11 LAB — CMP (CANCER CENTER ONLY)
ALT: <5 U/L (ref 0–44)
AST: 9 U/L — ABNORMAL LOW (ref 15–41)
Albumin: 3.1 g/dL — ABNORMAL LOW (ref 3.5–5.0)
Alkaline Phosphatase: 74 U/L (ref 38–126)
Anion gap: 12 (ref 5–15)
BUN: 13 mg/dL (ref 8–23)
CO2: 27 mmol/L (ref 22–32)
Calcium: 9.7 mg/dL (ref 8.9–10.3)
Chloride: 103 mmol/L (ref 98–111)
Creatinine: 0.9 mg/dL (ref 0.61–1.24)
GFR, Estimated: >60 mL/min (ref >60)
Glucose, Bld: 131 mg/dL — ABNORMAL HIGH (ref 70–99)
Potassium: 3.7 mmol/L (ref 3.5–5.1)
Sodium: 142 mmol/L (ref 135–145)
Total Bilirubin: 0.4 mg/dL (ref 0.3–1.2)
Total Protein: 7 g/dL (ref 6.5–8.1)

## 2021-02-11 LAB — CBC WITH DIFFERENTIAL (CANCER CENTER ONLY)
Abs Immature Granulocytes: 0.05 10*3/uL (ref 0.00–0.07)
Basophils Absolute: 0.1 10*3/uL (ref 0.0–0.1)
Basophils Relative: 1 %
Eosinophils Absolute: 0.1 10*3/uL (ref 0.0–0.5)
Eosinophils Relative: 1 %
HCT: 29 % — ABNORMAL LOW (ref 39.0–52.0)
Hemoglobin: 8 g/dL — ABNORMAL LOW (ref 13.0–17.0)
Immature Granulocytes: 1 %
Lymphocytes Relative: 9 %
Lymphs Abs: 1 10*3/uL (ref 0.7–4.0)
MCH: 19.4 pg — ABNORMAL LOW (ref 26.0–34.0)
MCHC: 27.6 g/dL — ABNORMAL LOW (ref 30.0–36.0)
MCV: 70.4 fL — ABNORMAL LOW (ref 80.0–100.0)
Monocytes Absolute: 1.1 10*3/uL — ABNORMAL HIGH (ref 0.1–1.0)
Monocytes Relative: 11 %
Neutro Abs: 8.4 10*3/uL — ABNORMAL HIGH (ref 1.7–7.7)
Neutrophils Relative %: 77 %
Platelet Count: 524 10*3/uL — ABNORMAL HIGH (ref 150–400)
RBC: 4.12 MIL/uL — ABNORMAL LOW (ref 4.22–5.81)
RDW: 17.5 % — ABNORMAL HIGH (ref 11.5–15.5)
WBC Count: 10.8 10*3/uL — ABNORMAL HIGH (ref 4.0–10.5)
nRBC: 0 % (ref 0.0–0.2)

## 2021-02-11 LAB — CBC
HCT: 29.5 % — ABNORMAL LOW (ref 39.0–52.0)
Hemoglobin: 7.8 g/dL — ABNORMAL LOW (ref 13.0–17.0)
MCH: 18.9 pg — ABNORMAL LOW (ref 26.0–34.0)
MCHC: 26.4 g/dL — ABNORMAL LOW (ref 30.0–36.0)
MCV: 71.6 fL — ABNORMAL LOW (ref 80.0–100.0)
Platelets: 463 10*3/uL — ABNORMAL HIGH (ref 150–400)
RBC: 4.12 MIL/uL — ABNORMAL LOW (ref 4.22–5.81)
RDW: 17.5 % — ABNORMAL HIGH (ref 11.5–15.5)
WBC: 9.2 10*3/uL (ref 4.0–10.5)
nRBC: 0 % (ref 0.0–0.2)

## 2021-02-11 LAB — COMPREHENSIVE METABOLIC PANEL
ALT: 8 U/L (ref 0–44)
AST: 10 U/L — ABNORMAL LOW (ref 15–41)
Albumin: 3.3 g/dL — ABNORMAL LOW (ref 3.5–5.0)
Alkaline Phosphatase: 65 U/L (ref 38–126)
Anion gap: 9 (ref 5–15)
BUN: 15 mg/dL (ref 8–23)
CO2: 28 mmol/L (ref 22–32)
Calcium: 9.3 mg/dL (ref 8.9–10.3)
Chloride: 102 mmol/L (ref 98–111)
Creatinine, Ser: 0.87 mg/dL (ref 0.61–1.24)
GFR, Estimated: >60 mL/min (ref >60)
Glucose, Bld: 108 mg/dL — ABNORMAL HIGH (ref 70–99)
Potassium: 3.5 mmol/L (ref 3.5–5.1)
Sodium: 139 mmol/L (ref 135–145)
Total Bilirubin: 0.6 mg/dL (ref 0.3–1.2)
Total Protein: 7.1 g/dL (ref 6.5–8.1)

## 2021-02-11 LAB — ABO/RH: ABO/RH(D): O NEG

## 2021-02-11 LAB — D-DIMER, QUANTITATIVE: D-Dimer, Quant: 2.35 ug/mL-FEU — ABNORMAL HIGH (ref 0.00–0.50)

## 2021-02-11 LAB — PHOSPHORUS: Phosphorus: 3.3 mg/dL (ref 2.5–4.6)

## 2021-02-11 LAB — GLUCOSE, CAPILLARY: Glucose-Capillary: 107 mg/dL — ABNORMAL HIGH (ref 70–99)

## 2021-02-11 LAB — RESP PANEL BY RT-PCR (FLU A&B, COVID) ARPGX2
Influenza A by PCR: NEGATIVE
Influenza B by PCR: NEGATIVE
SARS Coronavirus 2 by RT PCR: POSITIVE — AB

## 2021-02-11 LAB — PROCALCITONIN: Procalcitonin: <0.1 ng/mL

## 2021-02-11 LAB — BRAIN NATRIURETIC PEPTIDE: B Natriuretic Peptide: 288.6 pg/mL — ABNORMAL HIGH (ref 0.0–100.0)

## 2021-02-11 LAB — FERRITIN: Ferritin: 51 ng/mL (ref 24–336)

## 2021-02-11 LAB — MAGNESIUM: Magnesium: 1.9 mg/dL (ref 1.7–2.4)

## 2021-02-11 LAB — PREPARE RBC (CROSSMATCH)

## 2021-02-11 IMAGING — DX DG CHEST 1V PORT
2 series · 2 of 2 positions shown · non-contrast
Comparison: [DATE]

CLINICAL DATA: Pt c/o chest pain, sternum area, constant pain x 1
day that has since improved, SOB, general weakness that has
progressively gotten worse. HX: COPD, lung cancer (left side mass)
diagnosed [DATE].

EXAM:
PORTABLE CHEST - 1 VIEW

[chest ap (1 of 2)]
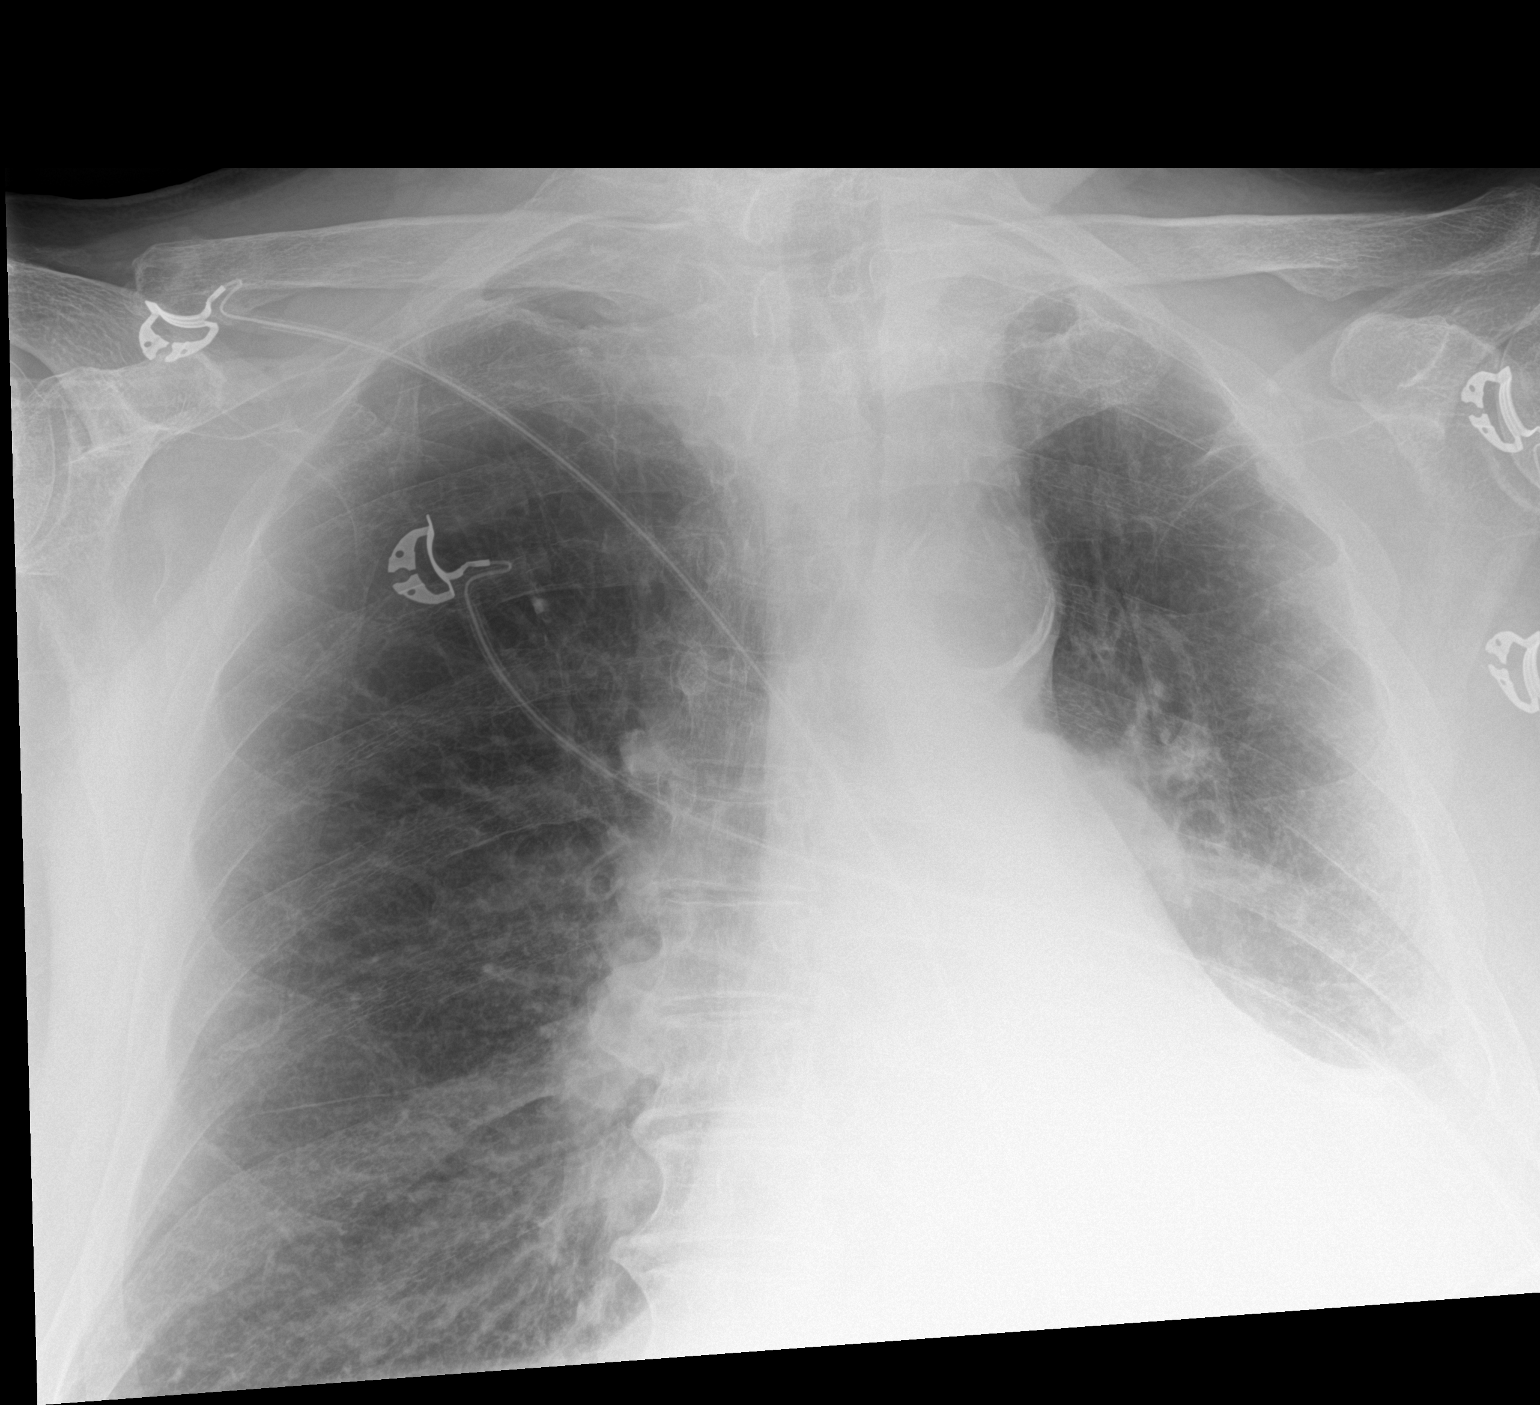

[chest ap (2 of 2)]
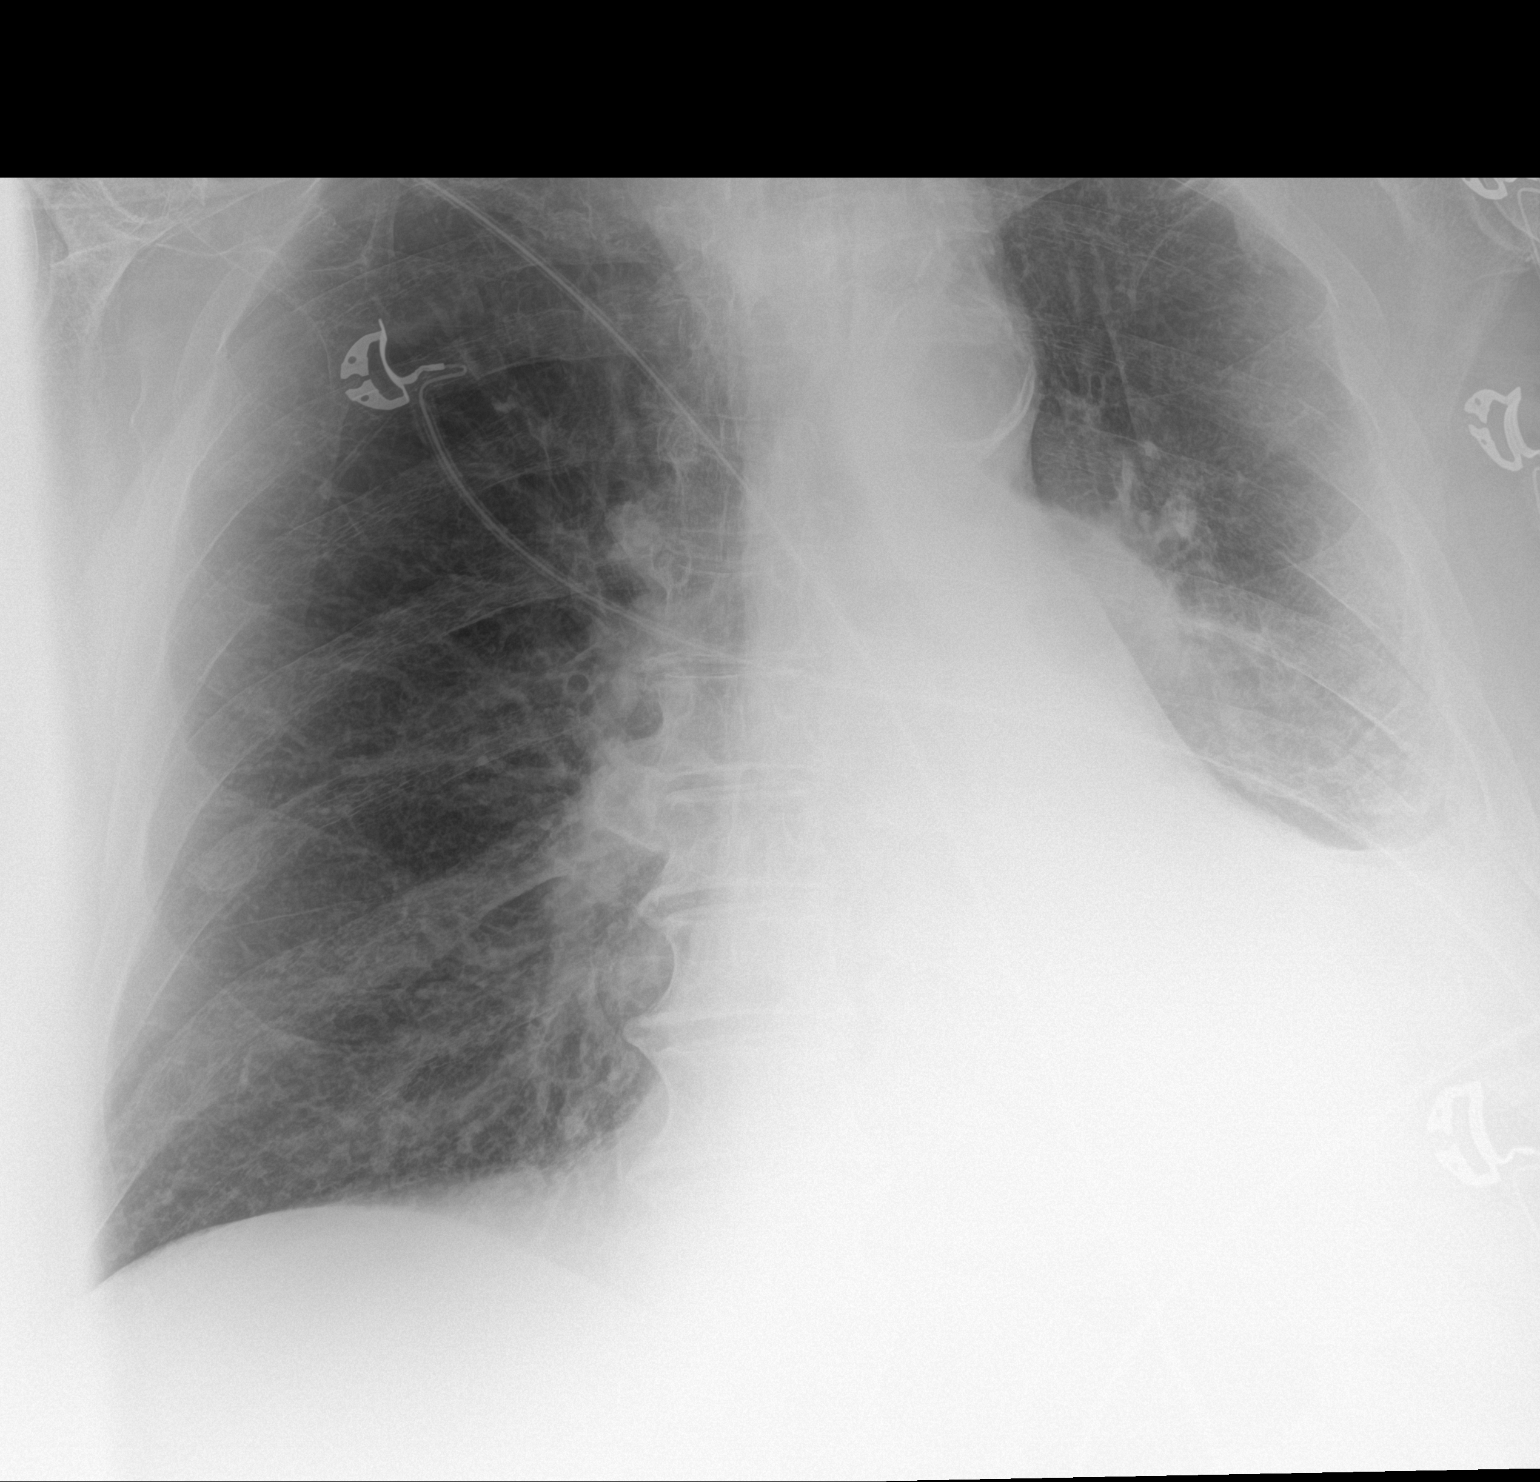

[2 of 2 positions shown; findings below may reference images not displayed]

FINDINGS: Interval improved aeration in left upper lung. Persistent dense
consolidation/atelectasis in the left lower lung with possible
associated effusion. The right lung remains clear.

Heart size difficult to assess due to adjacent opacities. Aortic
Atherosclerosis ([ED]-170.0).

No pneumothorax.

Vertebral endplate spurring at multiple levels in the lower thoracic
spine.
IMPRESSION: Dense left lower lung consolidation/atelectasis with possible
effusion

## 2021-02-11 MED ORDER — HYDROCHLOROTHIAZIDE 12.5 MG PO TABS
12.5000 mg | ORAL_TABLET | Freq: Every day | ORAL | Status: DC
  Administered 2021-02-12: 10:00:00 12.5 mg via ORAL
  Filled 2021-02-11: qty 1

## 2021-02-11 MED ORDER — POTASSIUM CHLORIDE CRYS ER 20 MEQ PO TBCR
40.0000 meq | EXTENDED_RELEASE_TABLET | Freq: Once | ORAL | Status: AC
  Administered 2021-02-11: 16:00:00 40 meq via ORAL
  Filled 2021-02-11: qty 2

## 2021-02-11 MED ORDER — SODIUM CHLORIDE 0.9 % IV SOLN
500.0000 mg | INTRAVENOUS | Status: DC
  Administered 2021-02-11 – 2021-02-13 (×3): 500 mg via INTRAVENOUS
  Filled 2021-02-11 (×3): qty 5

## 2021-02-11 MED ORDER — METFORMIN HCL 500 MG PO TABS
500.0000 mg | ORAL_TABLET | Freq: Two times a day (BID) | ORAL | Status: DC
  Administered 2021-02-12: 10:00:00 500 mg via ORAL
  Filled 2021-02-11: qty 1

## 2021-02-11 MED ORDER — POTASSIUM CHLORIDE CRYS ER 20 MEQ PO TBCR
20.0000 meq | EXTENDED_RELEASE_TABLET | Freq: Two times a day (BID) | ORAL | Status: DC
Start: 2021-02-11 — End: 2021-02-16
  Administered 2021-02-11 – 2021-02-16 (×10): 20 meq via ORAL
  Filled 2021-02-11 (×10): qty 1

## 2021-02-11 MED ORDER — MIRTAZAPINE 15 MG PO TABS
15.0000 mg | ORAL_TABLET | Freq: Every day | ORAL | Status: DC
  Administered 2021-02-11 – 2021-02-15 (×5): 15 mg via ORAL
  Filled 2021-02-11 (×4): qty 2
  Filled 2021-02-11 (×2): qty 1

## 2021-02-11 MED ORDER — HYDROCODONE BIT-HOMATROP MBR 5-1.5 MG/5ML PO SOLN
5.0000 mL | Freq: Four times a day (QID) | ORAL | Status: DC | PRN
  Administered 2021-02-12 – 2021-02-13 (×2): 5 mL via ORAL
  Filled 2021-02-11 (×2): qty 473

## 2021-02-11 MED ORDER — OMEPRAZOLE MAGNESIUM 20 MG PO TBEC: 20.0000 mg | DELAYED_RELEASE_TABLET | Freq: Every evening | ORAL | Status: DC

## 2021-02-11 MED ORDER — ATORVASTATIN CALCIUM 40 MG PO TABS: 20.0000 mg | ORAL_TABLET | Freq: Every day | ORAL | Status: DC

## 2021-02-11 MED ORDER — CARBIDOPA-LEVODOPA 25-100 MG PO TABS
1.5000 | ORAL_TABLET | Freq: Three times a day (TID) | ORAL | Status: DC
  Administered 2021-02-11 – 2021-02-16 (×14): 1.5 via ORAL
  Filled 2021-02-11 (×14): qty 2

## 2021-02-11 MED ORDER — NIRMATRELVIR/RITONAVIR (PAXLOVID)TABLET
3.0000 | ORAL_TABLET | Freq: Two times a day (BID) | ORAL | Status: DC
  Administered 2021-02-11 – 2021-02-12 (×2): 3 via ORAL
  Filled 2021-02-11: qty 30

## 2021-02-11 MED ORDER — FERROUS GLUCONATE 324 (38 FE) MG PO TABS
324.0000 mg | ORAL_TABLET | Freq: Every day | ORAL | Status: DC
  Administered 2021-02-12 – 2021-02-16 (×5): 324 mg via ORAL
  Filled 2021-02-11 (×5): qty 1

## 2021-02-11 MED ORDER — BUPROPION HCL ER (XL) 150 MG PO TB24
150.0000 mg | ORAL_TABLET | Freq: Two times a day (BID) | ORAL | Status: DC
  Administered 2021-02-11 – 2021-02-16 (×10): 150 mg via ORAL
  Filled 2021-02-11 (×10): qty 1

## 2021-02-11 MED ORDER — TRAMADOL HCL 50 MG PO TABS
50.0000 mg | ORAL_TABLET | Freq: Four times a day (QID) | ORAL | Status: DC | PRN
  Administered 2021-02-11 – 2021-02-16 (×8): 50 mg via ORAL
  Filled 2021-02-11 (×9): qty 1

## 2021-02-11 MED ORDER — LACTATED RINGERS IV BOLUS
500.0000 mL | Freq: Once | INTRAVENOUS | Status: AC
  Administered 2021-02-11: 14:00:00 500 mL via INTRAVENOUS

## 2021-02-11 MED ORDER — SODIUM CHLORIDE 0.9 % IV SOLN
2.0000 g | INTRAVENOUS | Status: AC
  Administered 2021-02-11 – 2021-02-15 (×5): 2 g via INTRAVENOUS
  Filled 2021-02-11 (×5): qty 20

## 2021-02-11 MED ORDER — ACETAMINOPHEN 650 MG RE SUPP: 650.0000 mg | Freq: Four times a day (QID) | RECTAL | Status: DC | PRN

## 2021-02-11 MED ORDER — ACETAMINOPHEN 325 MG PO TABS
650.0000 mg | ORAL_TABLET | Freq: Four times a day (QID) | ORAL | Status: DC | PRN
  Administered 2021-02-13 – 2021-02-15 (×2): 650 mg via ORAL
  Filled 2021-02-11 (×2): qty 2

## 2021-02-11 MED ORDER — DONEPEZIL HCL 5 MG PO TABS
10.0000 mg | ORAL_TABLET | Freq: Every day | ORAL | Status: DC
  Administered 2021-02-11 – 2021-02-15 (×5): 10 mg via ORAL
  Filled 2021-02-11 (×5): qty 2

## 2021-02-11 MED ORDER — NITROGLYCERIN 0.4 MG SL SUBL: 0.4000 mg | SUBLINGUAL_TABLET | SUBLINGUAL | Status: DC | PRN

## 2021-02-11 MED ORDER — SERTRALINE HCL 50 MG PO TABS
150.0000 mg | ORAL_TABLET | Freq: Every day | ORAL | Status: DC
  Administered 2021-02-12 – 2021-02-16 (×5): 150 mg via ORAL
  Filled 2021-02-11 (×5): qty 3

## 2021-02-11 MED ORDER — MODAFINIL 200 MG PO TABS
100.0000 mg | ORAL_TABLET | Freq: Every day | ORAL | Status: DC
  Administered 2021-02-12 – 2021-02-16 (×5): 100 mg via ORAL
  Filled 2021-02-11 (×5): qty 1

## 2021-02-11 MED ORDER — MAGNESIUM SULFATE 2 GM/50ML IV SOLN
2.0000 g | Freq: Once | INTRAVENOUS | Status: AC
  Administered 2021-02-11: 17:00:00 2 g via INTRAVENOUS
  Filled 2021-02-11: qty 50

## 2021-02-11 MED ORDER — SODIUM CHLORIDE 0.9 % IV SOLN
10.0000 mL/h | Freq: Once | INTRAVENOUS | Status: AC
  Administered 2021-02-11: 21:00:00 10 mL/h via INTRAVENOUS

## 2021-02-11 MED ORDER — DILTIAZEM HCL 30 MG PO TABS: 30.0000 mg | ORAL_TABLET | Freq: Once | ORAL | Status: DC

## 2021-02-11 MED ORDER — INSULIN ASPART 100 UNIT/ML IJ SOLN
0.0000 [IU] | Freq: Three times a day (TID) | INTRAMUSCULAR | Status: DC
Start: 2021-02-12 — End: 2021-02-16
  Administered 2021-02-12: 18:00:00 3 [IU] via SUBCUTANEOUS
  Administered 2021-02-13 (×2): 5 [IU] via SUBCUTANEOUS
  Administered 2021-02-14: 18:00:00 3 [IU] via SUBCUTANEOUS
  Administered 2021-02-14 – 2021-02-15 (×3): 2 [IU] via SUBCUTANEOUS
  Administered 2021-02-15 (×2): 3 [IU] via SUBCUTANEOUS
  Administered 2021-02-16 (×2): 2 [IU] via SUBCUTANEOUS

## 2021-02-11 MED ORDER — HYDROCHLOROTHIAZIDE 12.5 MG PO TABS: 25.0000 mg | ORAL_TABLET | Freq: Every day | ORAL | Status: DC

## 2021-02-11 MED ORDER — PANTOPRAZOLE SODIUM 40 MG PO TBEC
40.0000 mg | DELAYED_RELEASE_TABLET | Freq: Every day | ORAL | Status: DC
  Administered 2021-02-12 – 2021-02-16 (×5): 40 mg via ORAL
  Filled 2021-02-11 (×5): qty 1

## 2021-02-11 MED ORDER — DILTIAZEM HCL ER COATED BEADS 120 MG PO CP24
120.0000 mg | ORAL_CAPSULE | Freq: Every day | ORAL | Status: DC
  Filled 2021-02-11 (×2): qty 1

## 2021-02-11 MED ORDER — PROCHLORPERAZINE MALEATE 10 MG PO TABS: 10.0000 mg | ORAL_TABLET | Freq: Four times a day (QID) | ORAL | Status: DC | PRN

## 2021-02-11 NOTE — ED Triage Notes (Signed)
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

## 2021-02-11 NOTE — H&P (Signed)
History and Physical    James [name] YNW:[id number] DOB: 06-19-1945 DOA: 02/11/2021  PCP: James Pretty, MD   Patient coming from: Home.  I have personally briefly reviewed patient's old medical records in [place]  Chief Complaint: Dizziness.  Found to be in a flutter at the cancer center.  HPI: James [name] is a 75 y.o. male with medical history significant of seasonal allergy, left eye blindness, COPD, depression, type II DM, diverticulosis, glaucoma, hyperlipidemia, hypertension, insomnia, internal and external bleeding hemorrhoids, iron deficiency anemia, osteoarthritis, Parkinson's disease, rosacea, Parkinson's disease, recently diagnosed with stage III lung cancer who presented today for his first cycle of chemotherapy through the counseling center with complaints of dizziness and was found to be in atrial flutter.  He was subsequently transferred to the emergency department.  He has been having on and off palpitations for several weeks.  ED Course: Initial vital signs were temperature 98.3 F, pulse 152, respiration 18, BP 141/93 mmHg and O2 sat 100% on 4 L via nasal cannula.  The patient received 500 mL of LR bolus.  He was ordered 1 unit of PRBC.  Lab work: His CMP showed a glucose of 108 mg/dL, albumin of 3.3 g/dL and AST of 10 units/L.  The rest of the CMP values were unremarkable.  CBC showed a white count of 9.2, hemoglobin 7.8 g/dL platelets 463.  Coronavirus PCR was positive.  Review of Systems: As per HPI otherwise all other systems reviewed and are negative.  Past Medical History:  Diagnosis Date   Allergy    seasonal   Blind left eye    legally   Blood transfusion    2002   COPD (chronic obstructive pulmonary disease) (HCC)    Depression    Diabetes mellitus, type 2 (HCC)    Diverticulosis    Dyspnea    ED (erectile dysfunction)    Glaucoma    Hyperlipidemia    Hypertension    Insomnia    Internal and external bleeding hemorrhoids    Iron  deficiency anemia    Osteoarthritis    Parkinson's disease ([place]) 2014   Dr. [name] PCP  and Dr. [name] at [facility]   Rosacea    Tremor of both hands    Tubular adenoma of colon 03/28/2011   Past Surgical History:  Procedure Laterality Date   ARTHROSCOPIC REPAIR ACL     BALLOON DILATION  01/28/2021   Procedure: BALLOON DILATION;  Surgeon: [name], DO;  Location: [facility] ENDOSCOPY;  Service: Pulmonary;;   BRONCHIAL BIOPSY  01/28/2021   Procedure: BRONCHIAL BIOPSIES;  Surgeon: [name], DO;  Location: [place];  Service: Pulmonary;;   [place]   right   COLONOSCOPY WITH PROPOFOL N/A 07/25/2015   Procedure: COLONOSCOPY WITH PROPOFOL;  Surgeon: [name], MD;  Location: [facility] ENDOSCOPY;  Service: Endoscopy;  Laterality: N/A;   CRYOTHERAPY  01/28/2021   Procedure: CRYOTHERAPY;  Surgeon: [name], DO;  Location: [place] ENDOSCOPY;  Service: Pulmonary;;   ESOPHAGOGASTRODUODENOSCOPY (EGD) WITH PROPOFOL N/A 07/25/2015   Procedure: ESOPHAGOGASTRODUODENOSCOPY (EGD) WITH PROPOFOL;  Surgeon: [name], MD;  Location: [facility] ENDOSCOPY;  Service: Endoscopy;  Laterality: N/A;   HEMOSTASIS CONTROL  01/28/2021   Procedure: HEMOSTASIS CONTROL;  Surgeon: [name], DO;  Location: [place];  Service: Pulmonary;;   INTRAVASCULAR PRESSURE WIRE/FFR STUDY N/A 01/22/2021   Procedure: INTRAVASCULAR PRESSURE WIRE/FFR STUDY;  Surgeon: [name], Peter M, MD;  Location: [place] CV LAB;  Service: Cardiovascular;  Laterality: N/A;   IR THORACENTESIS ASP PLEURAL SPACE W/IMG GUIDE  09/17/2020   IR THORACENTESIS ASP PLEURAL SPACE W/IMG GUIDE  10/25/2020   KNEE ARTHROSCOPY  1996   LEFT HEART CATH AND CORONARY ANGIOGRAPHY N/A 01/22/2021   Procedure: LEFT HEART CATH AND CORONARY ANGIOGRAPHY;  Surgeon: [name], Peter M, MD;  Location: [place] CV LAB;  Service: Cardiovascular;  Laterality: N/A;   PARTIAL COLECTOMY  2008   THYROID CYST EXCISION      TONSILLECTOMY     VIDEO BRONCHOSCOPY Left 01/28/2021   Procedure: VIDEO BRONCHOSCOPY WITHOUT FLUORO;  Surgeon: [name], DO;  Location: [place];  Service: Pulmonary;  Laterality: Left;   Social History  reports that he quit smoking about 5 years ago. His smoking use included cigarettes. He has a 75.00 pack-year smoking history. He has never used smokeless tobacco. He reports that he does not currently use alcohol. He reports that he does not use drugs.  Allergies  Allergen Reactions   Aspirin Other (See Comments)    GI bleed   Codeine Itching   Morphine Itching   Family History  Problem Relation Age of Onset   CAD Father    Alcohol abuse Father    Heart disease Father    COPD Mother    Asthma Mother    Emphysema Mother    Cancer Maternal Grandmother    Colon cancer Neg Hx    Prior to Admission medications   Medication Sig Start Date End Date Taking? Authorizing Provider  atorvastatin (LIPITOR) 20 MG tablet Take 20 mg by mouth at bedtime.  05/29/12  Yes [provider]  buPROPion (WELLBUTRIN XL) 300 MG 24 hr tablet Take 150 mg by mouth 2 (two) times daily. 05/31/12  Yes [provider]  calcium-vitamin D (OSCAL WITH D) 250-125 MG-UNIT per tablet Take 1 tablet by mouth daily.   Yes [provider]  carbidopa-levodopa (SINEMET IR) 25-100 MG tablet TAKE 1.5 TABLETS BY MOUTH 3 TIMES DAILY. Patient taking differently: Take 1.5 tablets by mouth 3 (three) times daily. 02/05/21  Yes Lomax, Amy, NP  cetirizine (ZYRTEC) 10 MG tablet Take 10 mg by mouth 2 (two) times daily.   Yes [provider]  donepezil (ARICEPT) 10 MG tablet Take 1 tablet (10 mg total) by mouth at bedtime. 05/08/20  Yes Star Age, MD  ferrous gluconate (FERGON) 324 MG tablet TAKE 1 TABLET BY MOUTH DAILY WITH BREAKFAST Patient taking differently: 324 mg daily with breakfast. 09/26/19  Yes Nicholas Lose, MD  formoterol (PERFOROMIST) 20 MCG/2ML nebulizer solution Take 2 mLs (20 mcg  total) by nebulization 2 (two) times daily. Patient taking differently: Take 20 mcg by nebulization daily as needed (cough). 10/01/20  Yes Chesley Mires, MD  Nyoka Cowden Tea 315 MG CAPS Take 315 mg by mouth daily.   Yes [provider]  hydrochlorothiazide (HYDRODIURIL) 25 MG tablet Take 25 mg by mouth daily. 06/06/12  Yes [provider]  HYDROcodone bit-homatropine (HYCODAN) 5-1.5 MG/5ML syrup Take 5 mLs by mouth every 6 (six) hours as needed for cough. 12/19/20  Yes Magdalen Spatz, NP  ibuprofen (ADVIL) 800 MG tablet Take 1 tablet (800 mg total) by mouth every 8 (eight) hours as needed for moderate pain. 12/31/20  Yes Sood, Elisabeth Cara, MD  MEGARED OMEGA-3 KRILL OIL PO Take 1 capsule by mouth daily.   Yes [provider]  metFORMIN (GLUCOPHAGE) 500 MG tablet Take 1 tablet (500 mg total)  by mouth 2 (two) times daily with a meal. 01/25/21  Yes [name], Peter M, MD  mirtazapine (REMERON) 15 MG tablet Take 15 mg by mouth at bedtime. 06/04/20  Yes [provider]  Multiple Vitamins-Minerals (MULTIVITAMIN WITH MINERALS) tablet Take 1 tablet by mouth daily. Centrum   Yes [provider]  nitroGLYCERIN (NITROSTAT) 0.4 MG SL tablet Place 1 tablet (0.4 mg total) under the tongue every 5 (five) minutes as needed for chest pain. 10/24/20 02/11/21 Yes [name], MD  omeprazole (PRILOSEC OTC) 20 MG tablet Take 20 mg by mouth every evening.   Yes [provider]  OXYGEN Inhale 4-6 L into the lungs continuous.   Yes [provider]  potassium chloride SA (KLOR-CON M) 20 MEQ tablet Take 1 tablet (20 mEq total) by mouth 2 (two) times daily. 02/01/21  Yes Curt Bears, MD  prochlorperazine (COMPAZINE) 10 MG tablet Take 1 tablet (10 mg total) by mouth every 6 (six) hours as needed for nausea or vomiting. 02/01/21  Yes Curt Bears, MD  sertraline (ZOLOFT) 100 MG tablet Take 150 mg by mouth daily. 06/07/12  Yes [provider]  traMADol (ULTRAM) 50 MG  tablet Take 1 tablet (50 mg total) by mouth every 6 (six) hours as needed. Patient taking differently: Take 50 mg by mouth every 6 (six) hours as needed for moderate pain. 02/01/21  Yes Curt Bears, MD  modafinil (PROVIGIL) 100 MG tablet Take 1 tablet (100 mg total) by mouth daily. 01/10/21   Chesley Mires, MD  revefenacin (YUPELRI) 175 MCG/3ML nebulizer solution Take 3 mLs (175 mcg total) by nebulization daily. 09/14/20   Chesley Mires, MD   Physical Exam: Vitals:   02/11/21 1357 02/11/21 1430 02/11/21 1715 02/11/21 1754  BP:  (!) 147/59 (!) 136/58 (!) 146/76  Pulse:  (!) 50 (!) 102 (!) 106  Resp:  17 20 18   Temp: 98.9 F (37.2 C)   97.7 F (36.5 C)  TempSrc: Rectal   Axillary  SpO2:  100% 100% 99%  Weight:  91.3 kg    Height:  6\' 1"  (1.854 m)     Constitutional: NAD, calm, comfortable Eyes: PERRL, lids and conjunctivae normal ENMT: Mucous membranes are moist. Posterior pharynx clear of any exudate or lesions. Neck: normal, supple, no masses, no thyromegaly Respiratory: clear to auscultation bilaterally, no wheezing, no crackles. Normal respiratory effort. No accessory muscle use.  Cardiovascular: Regular rate and rhythm, no murmurs / rubs / gallops. No extremity edema. 2+ pedal pulses. No carotid bruits.  Abdomen: no tenderness, no masses palpated. No hepatosplenomegaly. Bowel sounds positive.  Musculoskeletal: no clubbing / cyanosis. No joint deformity upper and lower extremities. Good ROM, no contractures. Normal muscle tone.  Skin: no rashes, lesions, ulcers. No induration Neurologic: CN 2-12 grossly intact. Sensation intact, DTR normal. Strength 5/5 in all 4.  Psychiatric: Normal judgment and insight. Alert and oriented x 3. Normal mood.   Labs on Admission: I have personally reviewed following labs and imaging studies  CBC: Recent Labs  Lab 02/11/21 1121 02/11/21 1313  WBC 10.8* 9.2  NEUTROABS 8.4*  --   HGB 8.0* 7.8*  HCT 29.0* 29.5*  MCV 70.4* 71.6*  PLT 524* 463*     Basic Metabolic Panel: Recent Labs  Lab 02/11/21 1121 02/11/21 1313  NA 142 139  K 3.7 3.5  CL 103 102  CO2 27 28  GLUCOSE 131* 108*  BUN 13 15  CREATININE 0.90 0.87  CALCIUM 9.7 9.3  MG  --  1.9  PHOS  --  3.3    GFR: Estimated Creatinine Clearance: 82.9 mL/min (by C-G formula based on SCr of 0.87 mg/dL).  Liver Function Tests: Recent Labs  Lab 02/11/21 1121 02/11/21 1313  AST 9* 10*  ALT <5 8  ALKPHOS 74 65  BILITOT 0.4 0.6  PROT 7.0 7.1  ALBUMIN 3.1* 3.3*    Urine analysis: No results found for: COLORURINE, APPEARANCEUR, LABSPEC, PHURINE, GLUCOSEU, HGBUR, BILIRUBINUR, KETONESUR, PROTEINUR, UROBILINOGEN, NITRITE, LEUKOCYTESUR  Radiological Exams on Admission: DG Chest Port 1 View  Result Date: 02/11/2021 CLINICAL DATA:  Pt c/o chest pain, sternum area, constant pain x 1 day that has since improved, SOB, general weakness that has progressively gotten worse. HX: COPD, lung cancer (left side mass) diagnosed December 2022. EXAM: PORTABLE CHEST - 1 VIEW COMPARISON:  10/25/2020 FINDINGS: Interval improved aeration in left upper lung. Persistent dense consolidation/atelectasis in the left lower lung with possible associated effusion. The right lung remains clear. Heart size difficult to assess due to adjacent opacities. Aortic Atherosclerosis (ICD10-170.0). No pneumothorax. Vertebral endplate spurring at multiple levels in the lower thoracic spine. IMPRESSION: Dense left lower lung consolidation/atelectasis with possible effusion Electronically Signed   By: Lucrezia Europe M.D.   On: 02/11/2021 13:38    EKG: Independently reviewed.  EKG #1 Vent. rate 159 BPM PR interval * ms QRS duration 90 ms QT/QTcB 274/445 ms P-R-T axes * 20 243 Supraventricular tachycardia Nonspecific ST and T wave abnormality Abnormal ECG  EKG #2 Vent. rate 125 BPM PR interval * ms QRS duration 122 ms QT/QTcB 390/456 ms P-R-T axes * 41 97 Atrial flutter Nonspecific intraventricular  conduction delay Borderline ST elevation, inferior leads  Assessment/Plan Principal Problem:   New onset atrial flutter (HCC) Resolve with carotid massage/IVF. Observation/PCU. Cardizem 30 mg p.o. x1. Cardizem CD 120 mg p.o. every 24 hours. Decrease diuretic dose. Anticoagulation deferred given chronic blood loss. Check echocardiogram. Has been listed on the cardiology rounding list.  Active Problems:   2019 novel coronavirus detected Supplemental oxygen as needed. Bronchodilators as needed. Flutter valve and incentive spirometer. Check inflammatory markers. Given LLL consolidation, worsening dyspnea Will IV antibiotic coverage for CAP. Check procalcitonin level. Begin Paxlovid BID. Follow-up CBC, CMP and inflammatory markers in AM.    Hypertension Decrease HCTZ to 12.5 mg p.o. daily. Begin Cardizem CD 120 mg p.o. daily. Monitor BP, heart rate, renal function electrolytes.    COPD with emphysema ([place]) Supplemental oxygen as needed.    Iron deficiency anemia Secondary to chronic blood loss Transfuse 1 unit of PRBC. Monitor hematocrit and hemoglobin.    Hyperlipidemia Continue atorvastatin 20 mg p.o. daily.    Diabetes mellitus, type 2 (HCC) Carbohydrate modified diet.    Parkinson's disease ([name]) Continue Sinemet.    Depression Continue Zoloft 100 mg p.o. daily. Continue mirtazapine 50 mg p.o. bedtime. Continue Wellbutrin 300 mg p.o. daily. Continue donezepil 10 mg p.o. daily.    Mild protein malnutrition (HCC) Protein supplementation. Consider nutritional services evaluation.    DVT prophylaxis: SCDs. Code Status:   Full code. Family Communication:  His wife was at bedside. Disposition Plan:   Patient is from:  Home.  Anticipated DC to:  Home.  Anticipated DC date:  02/12/2021 or 02/13/2021.  Anticipated DC barriers: Clinical status.  Consults called:  Cardiology will evaluate tomorrow. Admission status:  Observation/PCU.    Severity of  Illness:High severity in the setting of new onset a flutter with symptomatic iron deficiency anemia.  Reubin Milan MD [facility]  How to contact the [facility] Attending or Consulting provider [place] or covering provider during after hours [place], for this patient?   Check the care team in [facility] and look for a) attending/consulting [facility] provider listed and b) the [facility] team listed Log into [email or web address] and use [place]'s universal password to access. If you do not have the password, please contact the hospital operator. Locate the [facility] provider you are looking for under [facility] and page to a number that you can be directly reached. If you still have difficulty reaching the provider, please page the [facility] (Director on Call) for the Hospitalists listed on amion for assistance.  02/11/2021, 5:59 PM   This document was prepared using Dragon voice recognition software and may contain some unintended transcription errors.

## 2021-02-11 NOTE — ED Provider Notes (Signed)
[facility] DEPT Provider Note   CSN: [id number] Arrival date & time: 02/11/21  1247     History No chief complaint on file.   James [name] is a 75 y.o. male.  HPI 75 year old male history of COPD, chronic anemia, Parkinson's disease, hyperlipidemia, hypertension, status postresection of the colon for polyps, with new diagnosis of stage III lung cancer who has been recently diagnosed and was to start cycle 1 of treatment today.  However, on arrival there they noted that his heart rate was 160.  Patient has noted increased weakness although he states that he is generally weak and he cannot specifically state exactly when this started.  He has had some ongoing cough that he does not think has changed from baseline.  He has increased weakness, states he has a poor appetite. He denies fever, chills, headache, vision change, chest pain, nausea, vomiting, diarrhea     Past Medical History:  Diagnosis Date   Allergy    seasonal   Blind left eye    legally   Blood transfusion    2002   COPD (chronic obstructive pulmonary disease) ([place])    Depression    Diabetes mellitus, type 2 ([place])    Diverticulosis    Dyspnea    ED (erectile dysfunction)    Glaucoma    Hyperlipidemia    Hypertension    Insomnia    Internal and external bleeding hemorrhoids    Iron deficiency anemia    Osteoarthritis    Parkinson's disease ([place]) 2014   Dr. [name] PCP  and Dr. 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   Iron deficiency anemia    Heme positive stool     History of colonic polyps    Primary cancer of left lower lobe of lung ([place]) 04/14/2013   Upper airway cough syndrome 04/14/2013   Hypoxemia 06/08/2012   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    Past Surgical History:  Procedure Laterality Date   ARTHROSCOPIC REPAIR ACL     BALLOON DILATION  01/28/2021   Procedure: BALLOON DILATION;  Surgeon: [name], DO;  Location: [facility];  Service: Pulmonary;;   BRONCHIAL BIOPSY  01/28/2021   Procedure: BRONCHIAL BIOPSIES;  Surgeon: [name], DO;  Location: [facility];  Service: Pulmonary;;   [place]   right   COLONOSCOPY WITH PROPOFOL N/A 07/25/2015   Procedure: COLONOSCOPY WITH PROPOFOL;  Surgeon: [name], MD;  Location: [facility];  Service: Endoscopy;  Laterality: N/A;   CRYOTHERAPY  01/28/2021   Procedure: CRYOTHERAPY;  Surgeon: [name], DO;  Location: [facility];  Service: Pulmonary;;   ESOPHAGOGASTRODUODENOSCOPY (EGD) WITH PROPOFOL N/A 07/25/2015   Procedure: ESOPHAGOGASTRODUODENOSCOPY (EGD) WITH PROPOFOL;  Surgeon: [name], MD;  Location: [facility];  Service: Endoscopy;  Laterality: N/A;   HEMOSTASIS CONTROL  01/28/2021   Procedure: HEMOSTASIS CONTROL;  Surgeon: [name], DO;  Location: [facility];  Service: Pulmonary;;   INTRAVASCULAR PRESSURE WIRE/FFR STUDY N/A 01/22/2021  Procedure: INTRAVASCULAR PRESSURE WIRE/FFR STUDY;  Surgeon: [name], Peter M, MD;  Location: [facility];  Service: Cardiovascular;  Laterality: N/A;   IR THORACENTESIS ASP PLEURAL SPACE W/IMG GUIDE  09/17/2020   IR THORACENTESIS ASP PLEURAL SPACE W/IMG GUIDE  10/25/2020   KNEE ARTHROSCOPY  1996   LEFT HEART CATH AND CORONARY ANGIOGRAPHY N/A 01/22/2021   Procedure: LEFT HEART CATH AND CORONARY ANGIOGRAPHY;  Surgeon: [name], Peter M, MD;  Location: [facility];  Service: Cardiovascular;  Laterality: N/A;   PARTIAL COLECTOMY  2008   THYROID CYST EXCISION      TONSILLECTOMY     VIDEO BRONCHOSCOPY Left 01/28/2021   Procedure: VIDEO BRONCHOSCOPY WITHOUT FLUORO;  Surgeon: [name], DO;  Location: [facility];  Service: Pulmonary;  Laterality: Left;       Family History  Problem Relation Age of Onset   CAD Father    Alcohol abuse Father    Heart disease Father    COPD Mother    Asthma Mother    Emphysema Mother    Cancer Maternal Grandmother    Colon cancer Neg Hx     Social History   Tobacco Use   Smoking status: Former    Packs/day: 1.50    Years: 50.00    Pack years: 75.00    Types: Cigarettes    Quit date: 04/30/2015    Years since quitting: 5.7   Smokeless tobacco: Never   Tobacco comments:    States he sometimes sneaks a cigarette  Vaping Use   Vaping Use: Never used  Substance Use Topics   Alcohol use: Not Currently    Comment: once yearly   Drug use: No    Home Medications Prior to Admission medications   Medication Sig Start Date End Date Taking? Authorizing Provider  atorvastatin (LIPITOR) 20 MG tablet Take 20 mg by mouth at bedtime.  05/29/12   [provider]  buPROPion (WELLBUTRIN XL) 300 MG 24 hr tablet Take 150 mg by mouth 2 (two) times daily. 05/31/12   [provider]  calcium-vitamin D (OSCAL WITH D) 250-125 MG-UNIT per tablet Take 1 tablet by mouth daily.    [provider]  carbidopa-levodopa (SINEMET IR) 25-100 MG tablet TAKE 1.5 TABLETS BY MOUTH 3 TIMES DAILY. 02/05/21   Lomax, Amy, NP  cetirizine (ZYRTEC) 10 MG tablet Take 10 mg by mouth 2 (two) times daily.    [provider]  donepezil (ARICEPT) 10 MG tablet Take 1 tablet (10 mg total) by mouth at bedtime. 05/08/20   Star Age, MD  ferrous gluconate (FERGON) 324 MG tablet TAKE 1 TABLET BY MOUTH DAILY WITH BREAKFAST 09/26/19   Nicholas Lose, MD  formoterol (PERFOROMIST) 20 MCG/2ML nebulizer solution Take 2 mLs (20 mcg total) by nebulization 2 (two) times daily. Patient taking differently: Take 20 mcg by  nebulization daily as needed (cough). 10/01/20   Chesley Mires, MD  Nyoka Cowden Tea 315 MG CAPS Take 315 mg by mouth daily.    [provider]  hydrochlorothiazide (HYDRODIURIL) 25 MG tablet Take 25 mg by mouth daily. 06/06/12   [provider]  HYDROcodone bit-homatropine (HYCODAN) 5-1.5 MG/5ML syrup Take 5 mLs by mouth every 6 (six) hours as needed for cough. 12/19/20   Magdalen Spatz, NP  ibuprofen (ADVIL) 800 MG tablet Take 1 tablet (800 mg total) by mouth every 8 (eight) hours as needed for moderate pain. 12/31/20   Chesley Mires, MD  MEGARED OMEGA-3 KRILL OIL PO Take 1  capsule by mouth daily.    [provider]  metFORMIN (GLUCOPHAGE) 500 MG tablet Take 1 tablet (500 mg total) by mouth 2 (two) times daily with a meal. 01/25/21   [name], Peter M, MD  mirtazapine (REMERON) 15 MG tablet Take 15 mg by mouth at bedtime. 06/04/20   [provider]  modafinil (PROVIGIL) 100 MG tablet Take 1 tablet (100 mg total) by mouth daily. 01/10/21   Chesley Mires, MD  Multiple Vitamins-Minerals (MULTIVITAMIN WITH MINERALS) tablet Take 1 tablet by mouth daily. Centrum    [provider]  nitroGLYCERIN (NITROSTAT) 0.4 MG SL tablet Place 1 tablet (0.4 mg total) under the tongue every 5 (five) minutes as needed for chest pain. 10/24/20 02/06/21  [name], MD  omeprazole (PRILOSEC OTC) 20 MG tablet Take 20 mg by mouth daily with breakfast.    [provider]  OXYGEN Inhale 4-6 L into the lungs continuous.    [provider]  potassium chloride SA (KLOR-CON M) 20 MEQ tablet Take 1 tablet (20 mEq total) by mouth 2 (two) times daily. 02/01/21   Curt Bears, MD  prochlorperazine (COMPAZINE) 10 MG tablet Take 1 tablet (10 mg total) by mouth every 6 (six) hours as needed for nausea or vomiting. 02/01/21   Curt Bears, MD  revefenacin [facility]) 175 MCG/3ML nebulizer solution Take 3 mLs (175 mcg total) by nebulization daily. 09/14/20   Chesley Mires, MD   sertraline (ZOLOFT) 100 MG tablet Take 150 mg by mouth daily. 06/07/12   [provider]  traMADol (ULTRAM) 50 MG tablet Take 1 tablet (50 mg total) by mouth every 6 (six) hours as needed. 02/01/21   Curt Bears, MD    Allergies    Aspirin, Codeine, and Morphine  Review of Systems   Review of Systems  All other systems reviewed and are negative.  Physical Exam Updated Vital Signs BP (!) 147/59    Pulse (!) 50    Temp 98.9 F (37.2 C) (Rectal)    Resp 17    Ht 1.854 m (6\' 1" )    Wt 91.3 kg    SpO2 100%    BMI 26.55 kg/m   Physical Exam Vitals and nursing note reviewed.  Constitutional:      General: He is not in acute distress.    Appearance: Normal appearance. He is ill-appearing.  HENT:     Head: Normocephalic.     Right Ear: External ear normal.     Left Ear: External ear normal.     Nose: Nose normal.     Mouth/Throat:     Pharynx: Oropharynx is clear.  Eyes:     Extraocular Movements: Extraocular movements intact.  Cardiovascular:     Rate and Rhythm: Tachycardia present. Rhythm irregular.  Pulmonary:     Effort: Pulmonary effort is normal.     Comments: Rhonchi left lower lobe Abdominal:     General: Bowel sounds are normal.     Palpations: Abdomen is soft.  Musculoskeletal:        General: No swelling. Normal range of motion.     Cervical back: Normal range of motion.     Right lower leg: No edema.     Left lower leg: No edema.  Skin:    General: Skin is warm and dry.     Capillary Refill: Capillary refill takes less than 2 seconds.  Neurological:     General: No focal deficit present.     Mental Status: He is alert.  Cranial Nerves: No cranial nerve deficit.  Psychiatric:        Mood and Affect: Mood normal.        Behavior: Behavior normal.    ED Results / Procedures / Treatments   Labs (all labs ordered are listed, but only abnormal results are displayed) Labs Reviewed  CBC - Abnormal; Notable for the following components:       Result Value   RBC 4.12 (*)    Hemoglobin 7.8 (*)    HCT 29.5 (*)    MCV 71.6 (*)    MCH 18.9 (*)    MCHC 26.4 (*)    RDW 17.5 (*)    Platelets 463 (*)    All other components within normal limits  COMPREHENSIVE METABOLIC PANEL - Abnormal; Notable for the following components:   Glucose, Bld 108 (*)    Albumin 3.3 (*)    AST 10 (*)    All other components within normal limits  RESP PANEL BY RT-PCR (FLU A&B, COVID) ARPGX2  URINALYSIS, ROUTINE W REFLEX MICROSCOPIC  PREPARE RBC (CROSSMATCH)    EKG EKG Interpretation  Date/Time:  Monday February 11 2021 13:03:17 EST Ventricular Rate:  125 PR Interval:    QRS Duration: 122 QT Interval:  390 QTC Calculation: 456 R Axis:   41 Text Interpretation: Atrial flutter Nonspecific intraventricular conduction delay Borderline ST elevation, inferior leads Confirmed by Pattricia Boss [phone]) on 02/11/2021 2:21:31 PM  Radiology DG Chest Port 1 View  Result Date: 02/11/2021 CLINICAL DATA:  Pt c/o chest pain, sternum area, constant pain x 1 day that has since improved, SOB, general weakness that has progressively gotten worse. HX: COPD, lung cancer (left side mass) diagnosed December 2022. EXAM: PORTABLE CHEST - 1 VIEW COMPARISON:  10/25/2020 FINDINGS: Interval improved aeration in left upper lung. Persistent dense consolidation/atelectasis in the left lower lung with possible associated effusion. The right lung remains clear. Heart size difficult to assess due to adjacent opacities. Aortic Atherosclerosis (ICD10-170.0). No pneumothorax. Vertebral endplate spurring at multiple levels in the lower thoracic spine. IMPRESSION: Dense left lower lung consolidation/atelectasis with possible effusion Electronically Signed   By: Lucrezia Europe M.D.   On: 02/11/2021 13:38    Procedures Procedures   Medications Ordered in ED Medications  0.9 %  sodium chloride infusion (has no administration in time range)  lactated ringers bolus 500 mL (500 mLs Intravenous  New Bag/Given 02/11/21 1333)    ED Course  I have reviewed the triage vital signs and the nursing notes.  Pertinent labs & imaging results that were available during my care of the patient were reviewed by me and considered in my medical decision making (see chart for details).  Clinical Course as of 02/11/21 1445  Mon Feb 11, 2021  1443 Discussed care with Dr. Olevia Bowens who will see for admission  [DR]    Clinical Course User Index [DR] Pattricia Boss, MD   MDM Rules/Calculators/A&P                          75 year old male with multiple health problems, new lung cancer, presents today with new onset atrial flutter with rapid rate.  Patient's rate decreased with carotid sinus massage.  He appeared to be somewhat volume depleted and has received IV fluids.  Blood pressure has been stable.  Patient's hemoglobin today is 7.8.  Patient reports ongoing history of anemia.  Review of records reveal anemia of 8.7 in August, 8.4 November, 7.5 on  December 9.  Patient is not currently on anticoagulation Patient with left lower lobe effusion and infiltrate on chest x-Wynnie Pacetti.  Appears that there was infiltrate and consolidation in this area on previous CT of chest.  He has chronic cough and does not endorse any new fever, chills, but has had some change in the color of his sputum. 1 atrial flutter patient not anticoagulated has been hemodynamically stable and have been IV fluids.  Heart rate has decreased to 113.  Discussed with Trish on for cardiology.  They will see tomorrow in consult.  Will make admitting team aware that they can consult them sooner if they have any ongoing problems 2-patient with newly diagnosed lung cancer.  He appears to have effusion versus infiltrate in left lower lobe.  Patient is not febrile.  COVID test is pending. 3 patient with ongoing anemia hemoglobin 7.5.  Will order transfusion of 1 unit. Final Clinical Impression(s) / ED Diagnoses Final diagnoses:  Atrial flutter, unspecified  type ([place])  Anemia, unspecified type    Rx / DC Orders ED Discharge Orders     None        Pattricia Boss, MD 02/11/21 1446

## 2021-02-11 NOTE — Discharge Instructions (Addendum)
Information on my medicine - ELIQUIS (apixaban)  Why was Eliquis prescribed for you? Eliquis was prescribed for you to reduce the risk of a blood clot forming that can cause a stroke if you have a medical condition called atrial fibrillation or atrial flutter (a type of irregular heartbeat).  What do You need to know about Eliquis ? Take your Eliquis TWICE DAILY - one tablet in the morning and one tablet in the evening with or without food. If you have difficulty swallowing the tablet whole please discuss with your pharmacist how to take the medication safely.  Take Eliquis exactly as prescribed by your doctor and DO NOT stop taking Eliquis without talking to the doctor who prescribed the medication.  Stopping may increase your risk of developing a stroke.  Refill your prescription before you run out.  After discharge, you should have regular check-up appointments with your healthcare provider that is prescribing your Eliquis.  In the future your dose may need to be changed if your kidney function or weight changes by a significant amount or as you get older.  What do you do if you miss a dose? If you miss a dose, take it as soon as you remember on the same day and resume taking twice daily.  Do not take more than one dose of ELIQUIS at the same time to make up a missed dose.  Important Safety Information A possible side effect of Eliquis is bleeding. You should call your healthcare provider right away if you experience any of the following: Bleeding from an injury or your nose that does not stop. Unusual colored urine (red or dark brown) or unusual colored stools (red or black). Unusual bruising for unknown reasons. A serious fall or if you hit your head (even if there is no bleeding).  Some medicines may interact with Eliquis and might increase your risk of bleeding or clotting while on Eliquis. To help avoid this, consult your healthcare provider or pharmacist prior to using any  new prescription or non-prescription medications, including herbals, vitamins, non-steroidal anti-inflammatory drugs (NSAIDs) and supplements.  This website has more information on Eliquis (apixaban): http://www.eliquis.com/eliquis/home

## 2021-02-12 ENCOUNTER — Ambulatory Visit
Admission: RE | Admit: 2021-02-12 | Discharge: 2021-02-12 | Disposition: A | Discharge status: Home / Self Care | Payer: Medicare Other | Source: Ambulatory Visit | Attending: Radiation Oncology | Admitting: Radiation Oncology

## 2021-02-12 ENCOUNTER — Observation Stay (HOSPITAL_COMMUNITY): Payer: Medicare Other

## 2021-02-12 DIAGNOSIS — E785 Hyperlipidemia, unspecified: Secondary | ICD-10-CM | POA: Diagnosis present

## 2021-02-12 DIAGNOSIS — D630 Anemia in neoplastic disease: Secondary | ICD-10-CM | POA: Diagnosis present

## 2021-02-12 DIAGNOSIS — D849 Immunodeficiency, unspecified: Secondary | ICD-10-CM | POA: Diagnosis present

## 2021-02-12 DIAGNOSIS — T380X5A Adverse effect of glucocorticoids and synthetic analogues, initial encounter: Secondary | ICD-10-CM | POA: Diagnosis not present

## 2021-02-12 DIAGNOSIS — R7989 Other specified abnormal findings of blood chemistry: Secondary | ICD-10-CM | POA: Diagnosis not present

## 2021-02-12 DIAGNOSIS — I4892 Unspecified atrial flutter: Secondary | ICD-10-CM | POA: Diagnosis present

## 2021-02-12 DIAGNOSIS — H5462 Unqualified visual loss, left eye, normal vision right eye: Secondary | ICD-10-CM | POA: Diagnosis present

## 2021-02-12 DIAGNOSIS — K219 Gastro-esophageal reflux disease without esophagitis: Secondary | ICD-10-CM | POA: Diagnosis present

## 2021-02-12 DIAGNOSIS — I493 Ventricular premature depolarization: Secondary | ICD-10-CM | POA: Diagnosis present

## 2021-02-12 DIAGNOSIS — D500 Iron deficiency anemia secondary to blood loss (chronic): Secondary | ICD-10-CM | POA: Diagnosis present

## 2021-02-12 DIAGNOSIS — J439 Emphysema, unspecified: Secondary | ICD-10-CM | POA: Diagnosis present

## 2021-02-12 DIAGNOSIS — Z87891 Personal history of nicotine dependence: Secondary | ICD-10-CM | POA: Diagnosis not present

## 2021-02-12 DIAGNOSIS — I2510 Atherosclerotic heart disease of native coronary artery without angina pectoris: Secondary | ICD-10-CM | POA: Diagnosis present

## 2021-02-12 DIAGNOSIS — Z8249 Family history of ischemic heart disease and other diseases of the circulatory system: Secondary | ICD-10-CM | POA: Diagnosis not present

## 2021-02-12 DIAGNOSIS — U071 COVID-19: Secondary | ICD-10-CM | POA: Diagnosis present

## 2021-02-12 DIAGNOSIS — F419 Anxiety disorder, unspecified: Secondary | ICD-10-CM | POA: Diagnosis present

## 2021-02-12 DIAGNOSIS — G20 Parkinson's disease: Secondary | ICD-10-CM | POA: Diagnosis present

## 2021-02-12 DIAGNOSIS — E1165 Type 2 diabetes mellitus with hyperglycemia: Secondary | ICD-10-CM | POA: Diagnosis not present

## 2021-02-12 DIAGNOSIS — Z825 Family history of asthma and other chronic lower respiratory diseases: Secondary | ICD-10-CM | POA: Diagnosis not present

## 2021-02-12 DIAGNOSIS — F32A Depression, unspecified: Secondary | ICD-10-CM | POA: Diagnosis present

## 2021-02-12 DIAGNOSIS — E042 Nontoxic multinodular goiter: Secondary | ICD-10-CM | POA: Diagnosis not present

## 2021-02-12 DIAGNOSIS — I10 Essential (primary) hypertension: Secondary | ICD-10-CM | POA: Diagnosis present

## 2021-02-12 DIAGNOSIS — J9621 Acute and chronic respiratory failure with hypoxia: Secondary | ICD-10-CM | POA: Diagnosis present

## 2021-02-12 DIAGNOSIS — E860 Dehydration: Secondary | ICD-10-CM | POA: Diagnosis present

## 2021-02-12 DIAGNOSIS — E441 Mild protein-calorie malnutrition: Secondary | ICD-10-CM | POA: Diagnosis present

## 2021-02-12 DIAGNOSIS — C3432 Malignant neoplasm of lower lobe, left bronchus or lung: Secondary | ICD-10-CM | POA: Diagnosis present

## 2021-02-12 LAB — COMPREHENSIVE METABOLIC PANEL
ALT: <5 U/L (ref 0–44)
AST: 11 U/L — ABNORMAL LOW (ref 15–41)
Albumin: 3.1 g/dL — ABNORMAL LOW (ref 3.5–5.0)
Alkaline Phosphatase: 62 U/L (ref 38–126)
Anion gap: 8 (ref 5–15)
BUN: 13 mg/dL (ref 8–23)
CO2: 30 mmol/L (ref 22–32)
Calcium: 9.2 mg/dL (ref 8.9–10.3)
Chloride: 103 mmol/L (ref 98–111)
Creatinine, Ser: 0.79 mg/dL (ref 0.61–1.24)
GFR, Estimated: >60 mL/min (ref >60)
Glucose, Bld: 102 mg/dL — ABNORMAL HIGH (ref 70–99)
Potassium: 4.1 mmol/L (ref 3.5–5.1)
Sodium: 141 mmol/L (ref 135–145)
Total Bilirubin: 0.7 mg/dL (ref 0.3–1.2)
Total Protein: 6.4 g/dL — ABNORMAL LOW (ref 6.5–8.1)

## 2021-02-12 LAB — GLUCOSE, CAPILLARY
Glucose-Capillary: 95 mg/dL (ref 70–99)
Glucose-Capillary: 118 mg/dL — ABNORMAL HIGH (ref 70–99)
Glucose-Capillary: 155 mg/dL — ABNORMAL HIGH (ref 70–99)
Glucose-Capillary: 199 mg/dL — ABNORMAL HIGH (ref 70–99)

## 2021-02-12 LAB — PROTIME-INR
INR: 1.1 (ref 0.8–1.2)
Prothrombin Time: 14.6 seconds (ref 11.4–15.2)

## 2021-02-12 LAB — CBC WITH DIFFERENTIAL/PLATELET
Abs Immature Granulocytes: 0.03 10*3/uL (ref 0.00–0.07)
Basophils Absolute: 0 10*3/uL (ref 0.0–0.1)
Basophils Relative: 0 %
Eosinophils Absolute: 0.1 10*3/uL (ref 0.0–0.5)
Eosinophils Relative: 2 %
HCT: 31.2 % — ABNORMAL LOW (ref 39.0–52.0)
Hemoglobin: 8.5 g/dL — ABNORMAL LOW (ref 13.0–17.0)
Immature Granulocytes: 0 %
Lymphocytes Relative: 8 %
Lymphs Abs: 0.7 10*3/uL (ref 0.7–4.0)
MCH: 20.3 pg — ABNORMAL LOW (ref 26.0–34.0)
MCHC: 27.2 g/dL — ABNORMAL LOW (ref 30.0–36.0)
MCV: 74.6 fL — ABNORMAL LOW (ref 80.0–100.0)
Monocytes Absolute: 1 10*3/uL (ref 0.1–1.0)
Monocytes Relative: 12 %
Neutro Abs: 6.6 10*3/uL (ref 1.7–7.7)
Neutrophils Relative %: 78 %
Platelets: 412 10*3/uL — ABNORMAL HIGH (ref 150–400)
RBC: 4.18 MIL/uL — ABNORMAL LOW (ref 4.22–5.81)
RDW: 17.7 % — ABNORMAL HIGH (ref 11.5–15.5)
WBC: 8.4 10*3/uL (ref 4.0–10.5)
nRBC: 0 % (ref 0.0–0.2)

## 2021-02-12 LAB — ECHOCARDIOGRAM COMPLETE
AR max vel: 2.87 cm2
AV Area VTI: 2.87 cm2
AV Area mean vel: 2.69 cm2
AV Mean grad: 4 mmHg
AV Peak grad: 8 mmHg
Ao pk vel: 1.42 m/s
Area-P 1/2: 5.08 cm2
Calc EF: 55.2 %
Height: 73 in
S' Lateral: 3.2 cm
Single Plane A2C EF: 55.7 %
Single Plane A4C EF: 54.6 %
Weight: 3219.2 oz

## 2021-02-12 LAB — FERRITIN: Ferritin: 52 ng/mL (ref 24–336)

## 2021-02-12 LAB — TYPE AND SCREEN
ABO/RH(D): O NEG
Antibody Screen: NEGATIVE
Unit division: 0

## 2021-02-12 LAB — C-REACTIVE PROTEIN
CRP: 8.2 mg/dL — ABNORMAL HIGH (ref <1.0)
CRP: 8.6 mg/dL — ABNORMAL HIGH (ref <1.0)

## 2021-02-12 LAB — HEMOGLOBIN A1C
Hgb A1c MFr Bld: 5.8 % — ABNORMAL HIGH (ref 4.8–5.6)
Mean Plasma Glucose: 119.76 mg/dL

## 2021-02-12 LAB — D-DIMER, QUANTITATIVE: D-Dimer, Quant: 2.02 ug/mL-FEU — ABNORMAL HIGH (ref 0.00–0.50)

## 2021-02-12 LAB — BPAM RBC
Blood Product Expiration Date: 202301162359
ISSUE DATE / TIME: 202212191746
Unit Type and Rh: 9500

## 2021-02-12 LAB — HEPARIN LEVEL (UNFRACTIONATED): Heparin Unfractionated: <0.1 IU/mL — ABNORMAL LOW (ref 0.30–0.70)

## 2021-02-12 LAB — MAGNESIUM: Magnesium: 2.1 mg/dL (ref 1.7–2.4)

## 2021-02-12 LAB — PHOSPHORUS: Phosphorus: 3.3 mg/dL (ref 2.5–4.6)

## 2021-02-12 LAB — APTT: aPTT: 30 seconds (ref 24–36)

## 2021-02-12 MED ORDER — VITAMIN D 25 MCG (1000 UNIT) PO TABS
1000.0000 [IU] | ORAL_TABLET | Freq: Every day | ORAL | Status: DC
  Administered 2021-02-12 – 2021-02-16 (×5): 1000 [IU] via ORAL
  Filled 2021-02-12 (×5): qty 1

## 2021-02-12 MED ORDER — DILTIAZEM HCL-DEXTROSE 125-5 MG/125ML-% IV SOLN (PREMIX)
5.0000 mg/h | INTRAVENOUS | Status: DC
  Administered 2021-02-12 – 2021-02-13 (×2): 5 mg/h via INTRAVENOUS
  Filled 2021-02-12 (×2): qty 125

## 2021-02-12 MED ORDER — METHYLPREDNISOLONE SODIUM SUCC 40 MG IJ SOLR
40.0000 mg | Freq: Every day | INTRAMUSCULAR | Status: DC
  Administered 2021-02-12 – 2021-02-13 (×2): 40 mg via INTRAVENOUS
  Filled 2021-02-12 (×2): qty 1

## 2021-02-12 MED ORDER — BENZONATATE 100 MG PO CAPS
200.0000 mg | ORAL_CAPSULE | Freq: Three times a day (TID) | ORAL | Status: DC
  Administered 2021-02-12 – 2021-02-15 (×8): 200 mg via ORAL
  Filled 2021-02-12 (×10): qty 2

## 2021-02-12 MED ORDER — ENSURE ENLIVE PO LIQD
237.0000 mL | Freq: Two times a day (BID) | ORAL | Status: DC
  Administered 2021-02-13 (×2): 237 mL via ORAL

## 2021-02-12 MED ORDER — PROSOURCE PLUS PO LIQD
30.0000 mL | Freq: Two times a day (BID) | ORAL | Status: DC
  Administered 2021-02-12 – 2021-02-15 (×6): 30 mL via ORAL
  Filled 2021-02-12 (×6): qty 30

## 2021-02-12 MED ORDER — DILTIAZEM LOAD VIA INFUSION
10.0000 mg | Freq: Once | INTRAVENOUS | Status: AC
  Administered 2021-02-12: 12:00:00 10 mg via INTRAVENOUS
  Filled 2021-02-12: qty 10

## 2021-02-12 MED ORDER — IPRATROPIUM-ALBUTEROL 20-100 MCG/ACT IN AERS
1.0000 | INHALATION_SPRAY | Freq: Four times a day (QID) | RESPIRATORY_TRACT | Status: DC
Start: 2021-02-12 — End: 2021-02-14
  Administered 2021-02-12 – 2021-02-14 (×8): 1 via RESPIRATORY_TRACT
  Filled 2021-02-12: qty 4

## 2021-02-12 MED ORDER — SODIUM CHLORIDE 0.9 % IV SOLN
200.0000 mg | Freq: Once | INTRAVENOUS | Status: AC
  Administered 2021-02-12: 14:00:00 200 mg via INTRAVENOUS
  Filled 2021-02-12: qty 40

## 2021-02-12 MED ORDER — HEPARIN BOLUS VIA INFUSION
3000.0000 [IU] | Freq: Once | INTRAVENOUS | Status: AC
  Administered 2021-02-12: 13:00:00 3000 [IU] via INTRAVENOUS
  Filled 2021-02-12: qty 3000

## 2021-02-12 MED ORDER — ASCORBIC ACID 500 MG PO TABS
500.0000 mg | ORAL_TABLET | Freq: Every day | ORAL | Status: DC
  Administered 2021-02-12 – 2021-02-16 (×5): 500 mg via ORAL
  Filled 2021-02-12 (×5): qty 1

## 2021-02-12 MED ORDER — SODIUM CHLORIDE 0.9 % IV SOLN
100.0000 mg | Freq: Every day | INTRAVENOUS | Status: AC
  Administered 2021-02-13 – 2021-02-16 (×4): 100 mg via INTRAVENOUS
  Filled 2021-02-12 (×4): qty 20

## 2021-02-12 MED ORDER — ZINC SULFATE 220 (50 ZN) MG PO CAPS
220.0000 mg | ORAL_CAPSULE | Freq: Every day | ORAL | Status: DC
  Administered 2021-02-12 – 2021-02-16 (×5): 220 mg via ORAL
  Filled 2021-02-12 (×5): qty 1

## 2021-02-12 MED ORDER — ADULT MULTIVITAMIN W/MINERALS CH
1.0000 | ORAL_TABLET | Freq: Every day | ORAL | Status: DC
  Administered 2021-02-12 – 2021-02-16 (×5): 1 via ORAL
  Filled 2021-02-12 (×5): qty 1

## 2021-02-12 MED ORDER — HEPARIN (PORCINE) 25000 UT/250ML-% IV SOLN
1900.0000 [IU]/h | INTRAVENOUS | Status: DC
  Administered 2021-02-12: 13:00:00 1300 [IU]/h via INTRAVENOUS
  Administered 2021-02-13: 06:00:00 1900 [IU]/h via INTRAVENOUS
  Filled 2021-02-12 (×2): qty 250

## 2021-02-12 MED ORDER — ATORVASTATIN CALCIUM 20 MG PO TABS
20.0000 mg | ORAL_TABLET | Freq: Every day | ORAL | Status: DC
  Administered 2021-02-12 – 2021-02-15 (×4): 20 mg via ORAL
  Filled 2021-02-12 (×4): qty 1

## 2021-02-12 NOTE — Consult Note (Addendum)
Cardiology Consultation:   Patient ID: James [name] MRN: [id number]; DOB: 1945/05/26  Admit date: 02/11/2021 Date of Consult: 02/12/2021  PCP:  [name], MD   [facility] Providers Cardiologist:  Minus Breeding, MD   {    Patient Profile:   James [name] is a 75 y.o. male with a hx of recently diagnosed stage III lung cancer, Parkinson's disease, COPD, type II DM, HLD, HTN, glaucoma, iron deficiency anemia,  who is being seen 02/12/2021 for the evaluation of new onset atrial flutter at the request of Dr. Olevia Bowens.  History of Present Illness:   James [name] presented on 12/19 for his first cycle of chemotherapy through the counseling center and complained of dizziness. Patient was found to be in atrial flutter with a HR of 160 BPM and was transferred to the emergency department. Reported intermittent palpitations, ongoing cough (chronic and unchanged from baseline). Denied fever, chest pain, n/v/d. Labs in the ED showed Na 142, K 3.7, BUN 13, Creatinine 0.90, albumin 3.1, Hemoglobin 8.0. EKG showed atrial flutter with 2:1 A-V conduction. BNP 288.6. Patient was COVID positive.  On exam, patient reports having chest pain along the sternum. Pain is constant, does not radiate. Not worse with exertion or relieved by rest. Relieved with his pain medication. Reports that this pain has been ongoing since yesterday. Reports having SOB that is similar to his baseline. Denies palpitations.   Patient is followed by cardiology. Patient was seen on 01/14/2021 for cardiovascular risk stratification following the finding of a lung mass on PET scan. At that visit, patient complained of chest pain that had been ongoing for several months. Pain was worse with minimal exacerbation and relieved by rest and 02 supplementation. Associated presyncope but no syncope. Patient underwent a left heart cath and coronary angiography on 01/22/2021 that showed 50% stenosis of the Mid LM-Dist LM, 25% stenosis of the  Prox RCA-Mid RCA, normal LV systolic function and end diastolic pressure. LVEF is 55-65%. Patient was cleared to undergo surgery for the removal of his lung mass.   Patient underwent a video bronchoscopy with biopsy and debulking of a left main stem tumor on 01/28/2021. Procedure was without complications. Patient was hospitalized following the procedure and was started on radiation treatments with plans for outpatient chemotherapy. Cardiology saw the patient on 02/06/2021 while he was still hospitalized, and he reported chest discomfort that was likely related to his lung cancer. Denied any new cardiovascular symptoms, PND, orthopnea.   Most recent echocardiogram was 06/06/2020 and showed LVEF 60-65%, normal LV function. LV diastolic parameters consistent with Grade I diastolic dysfunction. RV function normal. No significant valvular disorders. Patient had a 24-48 holter monitor in 2019 that showed Sinus rhythm/ 1st degree AV block/ bradycardia/ tachycardia. Rare isolated PVC's. Frequent PAC's/ no pairs, runs or pauses.  Past Medical History:  Diagnosis Date   Allergy    seasonal   Blind left eye    legally   Blood transfusion    2002   COPD (chronic obstructive pulmonary disease) (HCC)    Depression    Diabetes mellitus, type 2 (HCC)    Diverticulosis    Dyspnea    ED (erectile dysfunction)    Glaucoma    Hyperlipidemia    Hypertension    Insomnia    Internal and external bleeding hemorrhoids    Iron deficiency anemia    Osteoarthritis    Parkinson's disease ([place]) 2014   Dr. [name] PCP  and Dr. [name] at [facility]   Rosacea  Tremor of both hands    Tubular adenoma of colon 03/28/2011    Past Surgical History:  Procedure Laterality Date   ARTHROSCOPIC REPAIR ACL     BALLOON DILATION  01/28/2021   Procedure: BALLOON DILATION;  Surgeon: [name], DO;  Location: [place] [facility];  Service: Pulmonary;;   BRONCHIAL BIOPSY  01/28/2021   Procedure: BRONCHIAL BIOPSIES;  Surgeon: [name], DO;  Location: [place];  Service: Pulmonary;;   [place]   right   COLONOSCOPY WITH PROPOFOL N/A 07/25/2015   Procedure: COLONOSCOPY WITH PROPOFOL;  Surgeon: [name], MD;  Location: [facility];  Service: Endoscopy;  Laterality: N/A;   CRYOTHERAPY  01/28/2021   Procedure: CRYOTHERAPY;  Surgeon: [name], DO;  Location: [place] [facility];  Service: Pulmonary;;   ESOPHAGOGASTRODUODENOSCOPY (EGD) WITH PROPOFOL N/A 07/25/2015   Procedure: ESOPHAGOGASTRODUODENOSCOPY (EGD) WITH PROPOFOL;  Surgeon: [name], MD;  Location: [facility];  Service: Endoscopy;  Laterality: N/A;   HEMOSTASIS CONTROL  01/28/2021   Procedure: HEMOSTASIS CONTROL;  Surgeon: [name], DO;  Location: [place];  Service: Pulmonary;;   INTRAVASCULAR PRESSURE WIRE/FFR STUDY N/A 01/22/2021   Procedure: INTRAVASCULAR PRESSURE WIRE/FFR STUDY;  Surgeon: [name], Peter M, MD;  Location: [place] [facility];  Service: Cardiovascular;  Laterality: N/A;   IR THORACENTESIS ASP PLEURAL SPACE W/IMG GUIDE  09/17/2020   IR THORACENTESIS ASP PLEURAL SPACE W/IMG GUIDE  10/25/2020   KNEE ARTHROSCOPY  1996   LEFT HEART CATH AND CORONARY ANGIOGRAPHY N/A 01/22/2021   Procedure: LEFT HEART CATH AND CORONARY ANGIOGRAPHY;  Surgeon: [name], Peter M, MD;  Location: [place] [facility];  Service: Cardiovascular;  Laterality: N/A;   PARTIAL COLECTOMY  2008   THYROID CYST EXCISION     TONSILLECTOMY     VIDEO BRONCHOSCOPY Left 01/28/2021   Procedure: VIDEO BRONCHOSCOPY WITHOUT FLUORO;  Surgeon: [name], DO;  Location: [place];  Service: Pulmonary;  Laterality: Left;     Home Medications:  Prior to Admission medications   Medication Sig Start Date End Date Taking? Authorizing Provider  atorvastatin (LIPITOR) 20 MG tablet Take 20 mg by mouth at bedtime.  05/29/12  Yes [provider]  buPROPion (WELLBUTRIN XL) 300 MG 24 hr tablet Take 150 mg by mouth 2  (two) times daily. 05/31/12  Yes [provider]  calcium-vitamin D (OSCAL WITH D) 250-125 MG-UNIT per tablet Take 1 tablet by mouth daily.   Yes [provider]  carbidopa-levodopa (SINEMET IR) 25-100 MG tablet TAKE 1.5 TABLETS BY MOUTH 3 TIMES DAILY. Patient taking differently: Take 1.5 tablets by mouth 3 (three) times daily. 02/05/21  Yes Lomax, Amy, NP  cetirizine (ZYRTEC) 10 MG tablet Take 10 mg by mouth 2 (two) times daily.   Yes [provider]  donepezil (ARICEPT) 10 MG tablet Take 1 tablet (10 mg total) by mouth at bedtime. 05/08/20  Yes Star Age, MD  ferrous gluconate (FERGON) 324 MG tablet TAKE 1 TABLET BY MOUTH DAILY WITH BREAKFAST Patient taking differently: 324 mg daily with breakfast. 09/26/19  Yes Nicholas Lose, MD  formoterol (PERFOROMIST) 20 MCG/2ML nebulizer solution Take 2 mLs (20 mcg total) by nebulization 2 (two) times daily. Patient taking differently: Take 20 mcg by nebulization daily as needed (cough). 10/01/20  Yes Chesley Mires, MD  Nyoka Cowden Tea 315 MG CAPS Take 315 mg by mouth daily.   Yes [provider]  hydrochlorothiazide (HYDRODIURIL) 25 MG tablet  Take 25 mg by mouth daily. 06/06/12  Yes [provider]  HYDROcodone bit-homatropine (HYCODAN) 5-1.5 MG/5ML syrup Take 5 mLs by mouth every 6 (six) hours as needed for cough. 12/19/20  Yes Magdalen Spatz, NP  ibuprofen (ADVIL) 800 MG tablet Take 1 tablet (800 mg total) by mouth every 8 (eight) hours as needed for moderate pain. 12/31/20  Yes Sood, Elisabeth Cara, MD  MEGARED OMEGA-3 KRILL OIL PO Take 1 capsule by mouth daily.   Yes [provider]  metFORMIN (GLUCOPHAGE) 500 MG tablet Take 1 tablet (500 mg total) by mouth 2 (two) times daily with a meal. 01/25/21  Yes [name], Peter M, MD  mirtazapine (REMERON) 15 MG tablet Take 15 mg by mouth at bedtime. 06/04/20  Yes [provider]  Multiple Vitamins-Minerals (MULTIVITAMIN WITH MINERALS) tablet Take 1 tablet by mouth daily.  Centrum   Yes [provider]  nitroGLYCERIN (NITROSTAT) 0.4 MG SL tablet Place 1 tablet (0.4 mg total) under the tongue every 5 (five) minutes as needed for chest pain. 10/24/20 02/11/21 Yes [name], MD  omeprazole (PRILOSEC OTC) 20 MG tablet Take 20 mg by mouth every evening.   Yes [provider]  OXYGEN Inhale 4-6 L into the lungs continuous.   Yes [provider]  potassium chloride SA (KLOR-CON M) 20 MEQ tablet Take 1 tablet (20 mEq total) by mouth 2 (two) times daily. 02/01/21  Yes Curt Bears, MD  prochlorperazine (COMPAZINE) 10 MG tablet Take 1 tablet (10 mg total) by mouth every 6 (six) hours as needed for nausea or vomiting. 02/01/21  Yes Curt Bears, MD  sertraline (ZOLOFT) 100 MG tablet Take 150 mg by mouth daily. 06/07/12  Yes [provider]  traMADol (ULTRAM) 50 MG tablet Take 1 tablet (50 mg total) by mouth every 6 (six) hours as needed. Patient taking differently: Take 50 mg by mouth every 6 (six) hours as needed for moderate pain. 02/01/21  Yes Curt Bears, MD  modafinil (PROVIGIL) 100 MG tablet Take 1 tablet (100 mg total) by mouth daily. 01/10/21   Chesley Mires, MD  revefenacin (YUPELRI) 175 MCG/3ML nebulizer solution Take 3 mLs (175 mcg total) by nebulization daily. 09/14/20   Chesley Mires, MD    Inpatient Medications: Scheduled Meds:  buPROPion  150 mg Oral BID   carbidopa-levodopa  1.5 tablet Oral TID   diltiazem  120 mg Oral Daily   diltiazem  30 mg Oral Once   donepezil  10 mg Oral QHS   ferrous gluconate  324 mg Oral Q breakfast   hydrochlorothiazide  12.5 mg Oral Daily   insulin aspart  0-15 Units Subcutaneous TID WC   metFORMIN  500 mg Oral BID WC   mirtazapine  15 mg Oral QHS   modafinil  100 mg Oral Daily   nirmatrelvir/ritonavir EUA  3 tablet Oral BID   pantoprazole  40 mg Oral Daily   potassium chloride SA  20 mEq Oral BID   sertraline  150 mg Oral Daily   Continuous Infusions:  azithromycin 500  mg (02/11/21 2205)   cefTRIAXone (ROCEPHIN)  IV 2 g (02/11/21 2128)   PRN Meds: acetaminophen **OR** acetaminophen, HYDROcodone bit-homatropine, nitroGLYCERIN, prochlorperazine, traMADol  Allergies:    Allergies  Allergen Reactions   Aspirin Other (See Comments)    GI bleed   Codeine Itching   Morphine Itching    Social History:   Social History   Socioeconomic History   Marital status: Married    Spouse name:  Not on file   Number of children: 1   Years of education: HS   Highest education level: Not on file  Occupational History   Occupation: retired    Fish farm manager: RETIRED  Tobacco Use   Smoking status: Former    Packs/day: 1.50    Years: 50.00    Pack years: 75.00    Types: Cigarettes    Quit date: 04/30/2015    Years since quitting: 5.7   Smokeless tobacco: Never   Tobacco comments:    States he sometimes sneaks a cigarette  Vaping Use   Vaping Use: Never used  Substance and Sexual Activity   Alcohol use: Not Currently    Comment: once yearly   Drug use: No   Sexual activity: Not on file  Other Topics Concern   Not on file  Social History Narrative   Drinks 1-2 Pepsi a day    Social Determinants of Health   Financial Resource Strain: Not on file  Food Insecurity: Not on file  Transportation Needs: Not on file  Physical Activity: Not on file  Stress: Not on file  Social Connections: Not on file  Intimate Partner Violence: Not on file    Family History:    Family History  Problem Relation Age of Onset   CAD Father    Alcohol abuse Father    Heart disease Father    COPD Mother    Asthma Mother    Emphysema Mother    Cancer Maternal Grandmother    Colon cancer Neg Hx      ROS:  Please see the history of present illness.   All other ROS reviewed and negative.     Physical Exam/Data:   Vitals:   02/11/21 1843 02/11/21 2115 02/11/21 2250 02/12/21 0317  BP: 123/73 (!) 135/59  139/61  Pulse: (!) 57 (!) 51  (!) 55  Resp: 20 18 16 18   Temp:  99.2 F (37.3 C) 98.8 F (37.1 C)  98.5 F (36.9 C)  TempSrc: Oral Oral  Oral  SpO2: 100% 100%  98%  Weight:      Height:        Intake/Output Summary (Last 24 hours) at 02/12/2021 0748 Last data filed at 02/12/2021 0420 Gross per 24 hour  Intake 1112.62 ml  Output --  Net 1112.62 ml   Last 3 Weights 02/11/2021 02/11/2021 02/06/2021  Weight (lbs) 201 lb 3.2 oz 201 lb 3.2 oz 202 lb  Weight (kg) 91.264 kg 91.264 kg 91.627 kg     Body mass index is 26.55 kg/m.  General:  Well nourished, appears chronically ill, no acute distress, nasal cannula with 4L oxygen    HEENT: normal Vascular: No carotid bruits; Radial distal pulses 2+ bilaterally Cardiac:  normal S1, S2; no murmur, irregular  Lungs: Rhonchi heard throughout all lung fields Abd: soft, tender in the epigastric region  Ext: no edema Musculoskeletal:  No deformities Skin: warm and dry  Neuro:  CNs 2-12 intact, no focal abnormalities noted Psych:  Normal affect   EKG:  The EKG was personally reviewed and demonstrates:  Atrial flutter with 2:1 conduction  Telemetry:  Telemetry was personally reviewed and demonstrates:  Atrial flutter   Relevant CV Studies:  Cardiac Cath 01/22/2021   Mid LM to Dist LM lesion is 50% stenosed.   Prox RCA to Mid RCA lesion is 25% stenosed.   The left ventricular systolic function is normal.   LV end diastolic pressure is normal.   The left ventricular  ejection fraction is 55-65% by visual estimate.   Nonobstructive CAD. There is a 50% distal left main stenosis but this is not hemodynamically significant by flow wire assessment. Normal LV function Normal LVEDP   Plan: patient is cleared to proceed with evaluation and treatment of his lung mass. Risk factor modification.   Echo 06/06/2020 1. Left ventricular ejection fraction, by estimation, is 60 to 65%. The  left ventricle has normal function. The left ventricle has no regional  wall motion abnormalities. Left ventricular diastolic  parameters are  consistent with Grade I diastolic  dysfunction (impaired relaxation).   2. Right ventricular systolic function is normal. The right ventricular  size is normal. Tricuspid regurgitation signal is inadequate for assessing  PA pressure.   3. The mitral valve is normal in structure. No evidence of mitral valve  regurgitation. No evidence of mitral stenosis.   4. The aortic valve is tricuspid. Aortic valve regurgitation is trivial.  No aortic stenosis is present.   5. Aortic dilatation noted. There is moderate dilatation of the aortic  root, measuring 45 mm.   6. The inferior vena cava is normal in size with greater than 50%  respiratory variability, suggesting right atrial pressure of 3 mmHg.   Laboratory Data:  High Sensitivity Troponin:  No results for input(s): TROPONINIHS in the last 720 hours.   Chemistry Recent Labs  Lab 02/11/21 1121 02/11/21 1313 02/12/21 0416  NA 142 139 141  K 3.7 3.5 4.1  CL 103 102 103  CO2 27 28 30   GLUCOSE 131* 108* 102*  BUN 13 15 13   CREATININE 0.90 0.87 0.79  CALCIUM 9.7 9.3 9.2  MG  --  1.9 2.1  GFRNONAA >60 >60 >60  ANIONGAP 12 9 8     Recent Labs  Lab 02/11/21 1121 02/11/21 1313 02/12/21 0416  PROT 7.0 7.1 6.4*  ALBUMIN 3.1* 3.3* 3.1*  AST 9* 10* 11*  ALT <5 8 <5  ALKPHOS 74 65 62  BILITOT 0.4 0.6 0.7   Lipids No results for input(s): CHOL, TRIG, HDL, LABVLDL, LDLCALC, CHOLHDL in the last 168 hours.  Hematology Recent Labs  Lab 02/11/21 1121 02/11/21 1313 02/12/21 0416  WBC 10.8* 9.2 8.4  RBC 4.12* 4.12* 4.18*  HGB 8.0* 7.8* 8.5*  HCT 29.0* 29.5* 31.2*  MCV 70.4* 71.6* 74.6*  MCH 19.4* 18.9* 20.3*  MCHC 27.6* 26.4* 27.2*  RDW 17.5* 17.5* 17.7*  PLT 524* 463* 412*   Thyroid No results for input(s): TSH, FREET4 in the last 168 hours.  BNP Recent Labs  Lab 02/11/21 2225  BNP 288.6*    DDimer  Recent Labs  Lab 02/11/21 2225 02/12/21 0416  DDIMER 2.35* 2.02*     Radiology/Studies:  DG Chest  Port 1 View  Result Date: 02/11/2021 CLINICAL DATA:  Pt c/o chest pain, sternum area, constant pain x 1 day that has since improved, SOB, general weakness that has progressively gotten worse. HX: COPD, lung cancer (left side mass) diagnosed December 2022. EXAM: PORTABLE CHEST - 1 VIEW COMPARISON:  10/25/2020 FINDINGS: Interval improved aeration in left upper lung. Persistent dense consolidation/atelectasis in the left lower lung with possible associated effusion. The right lung remains clear. Heart size difficult to assess due to adjacent opacities. Aortic Atherosclerosis (ICD10-170.0). No pneumothorax. Vertebral endplate spurring at multiple levels in the lower thoracic spine. IMPRESSION: Dense left lower lung consolidation/atelectasis with possible effusion Electronically Signed   By: Lucrezia Europe M.D.   On: 02/11/2021 13:38     Assessment and  Plan:   New onset atrial flutter: Patient remains in atrial flutter per telemetry. Possibly related to recent surgery, lung disease, COVID infection.   - Ordered cardizem 30mg  PO x1, then started on Cardizem CD 120mg  PO daily, however not given due to bradycardia. On telemetry review, bradycardia not present  - Start heparin for anticoagulation, plan to transition to [place] if hematocrit and hemoglobin tolerate  - Echocardiogram results pending   HTN  - Decreased HCTZ to 12.5 mg in the setting of dehydration, consider increasing back to home dose as BP allows   COPD : - Supplemental oxygen as needed, patient currently on 4 L via nasal cannula   Iron deficiency anemia: Chronic, patient reports that he has been worked up by GI in the past but was never diagnosed with specific GI bleed  - Patient was transfused with 1 unit PRBC  - Monitor hematocrit and hemoglobin   HLD: - Continue Lipitor 20 mg   DM: -Patient on sliding scale insulin and metformin   COVID infection: - Treatment per primary team  Risk Assessment/Risk Scores:   CHA2DS2-VASc Score =  5  This indicates a 7.2% annual risk of stroke. The patient's score is based upon: CHF History: 0 HTN History: 1 Diabetes History: 1 Stroke History: 0 Vascular Disease History: 1 Age Score: 2 Gender Score: 0    For questions or updates, please contact [facility] Please consult [email or web address] for contact info under   Signed, Gustavus Haskin, PA-C  02/12/2021 7:48 AM As above, patient seen and examined.  Briefly he is a 75 year old male with past medical history of stage III lung cancer status post resection, Parkinson's, COPD, diabetes mellitus, hypertension, hyperlipidemia for evaluation of atrial flutter.  Patient presented for his first round of chemotherapy for his lung cancer on December 19 and was found to be tachycardic.  He was found to be in atrial flutter and also COVID-positive.  Cardiology asked to evaluate.  He denies palpitations, increased dyspnea.  He has chest pain when he drinks something "cold".  His LV function has been normal in the past. Electrocardiogram shows atrial flutter with rapid ventricular response, nonspecific ST changes.  Hemoglobin 8.5 with MCV 74.6. 1 atrial flutter-newly diagnosed.  Patient is asymptomatic.  Heart rate is elevated.  Will add IV Cardizem for rate control.  Await echocardiogram for LV function.  CHA2DS2-VASc is 5.  He is anemic but there is no evidence of active bleeding.  We will place him on IV heparin to see if he tolerates and if so transition to apixaban.  2 hypertension-given that we are initiating IV Cardizem I will discontinue hydrochlorothiazide.  Follow blood pressure and adjust medications as needed.  3 lung cancer-Per oncology.  [place] per primary care.  Kirk Ruths, MD

## 2021-02-12 NOTE — Progress Notes (Signed)
James [name] for Heparin Indication: Atrial Flutter  Allergies  Allergen Reactions   Aspirin Other (See Comments)    GI bleed   Codeine Itching   Morphine Itching    Patient Measurements: Height: 6\' 1"  (185.4 cm) Weight: 91.3 kg (201 lb 3.2 oz) IBW/kg (Calculated) : 79.9 Heparin Dosing Weight: 83.3 kg  Vital Signs: Temp: 98.5 F (36.9 C) (12/20 0821) Temp Source: Oral (12/20 0821) BP: 139/61 (12/20 0317) Pulse Rate: 55 (12/20 0317)  Labs: Recent Labs    02/11/21 1121 02/11/21 1313 02/12/21 0416  HGB 8.0* 7.8* 8.5*  HCT 29.0* 29.5* 31.2*  PLT 524* 463* 412*  CREATININE 0.90 0.87 0.79    Estimated Creatinine Clearance: 90.2 mL/min (by C-G formula based on SCr of 0.79 mg/dL).   Medical History: Past Medical History:  Diagnosis Date   Allergy    seasonal   Blind left eye    legally   Blood transfusion    2002   COPD (chronic obstructive pulmonary disease) (HCC)    Depression    Diabetes mellitus, type 2 (HCC)    Diverticulosis    Dyspnea    ED (erectile dysfunction)    Glaucoma    Hyperlipidemia    Hypertension    Insomnia    Internal and external bleeding hemorrhoids    Iron deficiency anemia    Osteoarthritis    Parkinson's disease ([place]) 2014   Dr. [name] PCP  and Dr. [name] at [facility]   Rosacea    Tremor of both hands    Tubular adenoma of colon 03/28/2011    Medications:  Scheduled:   buPROPion  150 mg Oral BID   carbidopa-levodopa  1.5 tablet Oral TID   diltiazem  10 mg Intravenous Once   donepezil  10 mg Oral QHS   ferrous gluconate  324 mg Oral Q breakfast   insulin aspart  0-15 Units Subcutaneous TID WC   metFORMIN  500 mg Oral BID WC   mirtazapine  15 mg Oral QHS   modafinil  100 mg Oral Daily   nirmatrelvir/ritonavir EUA  3 tablet Oral BID   pantoprazole  40 mg Oral Daily   potassium chloride SA  20 mEq Oral BID   sertraline  150 mg Oral Daily   Infusions:   azithromycin 500 mg (02/11/21 2205)    cefTRIAXone (ROCEPHIN)  IV 2 g (02/11/21 2128)   diltiazem (CARDIZEM) infusion     PRN: acetaminophen **OR** acetaminophen, HYDROcodone bit-homatropine, nitroGLYCERIN, prochlorperazine, traMADol  Assessment: 75 y/o M recently diagnosed with stage III lung cancer, presented for first cycle of chemotherapy at [facility], was noted to be dizzy and found to be in atrial flutter; admitted through ED.  Orders received from cardiology to begin heparin infusion.  Plans noted for transition to [place] if Hct and Hgb remain acceptable on IV heparin.   Goal of Therapy:  Heparin level 0.3-0.7 units/ml Monitor platelets by anticoagulation protocol: Yes   Plan:  -Baseline aPTT, PT -Heparin 3000 units IV bolus -Heparin 1300 units/hr IV infusion -Check heparin level in 8 hours -Heparin level, CBC daily -Follow for any reports of bleeding  Clayburn Pert, PharmD, [place] [phone] 02/12/2021  10:42 AM

## 2021-02-12 NOTE — Progress Notes (Deleted)
Hypoglycemic Event  CBG: 36  Treatment: D50 ( 1 ampu)  Symptoms: Pale, Sweaty, Hungry, Nervous/irritable, and Vision changes  Follow-up CBG: Time:1730 CBG Result:193  Possible Reasons for Event: Unknown  Comments/MD notified:1725    [name], [name]

## 2021-02-12 NOTE — Plan of Care (Signed)

## 2021-02-12 NOTE — Progress Notes (Signed)
Pharmacy - IV heparin  Assessment:    Please see note from Clayburn Pert, PharmD earlier today for full details.  Briefly, 76 y.o. male on IV heparin for Aflutter  First heparin level undetectable on 1300 units/hr No infusion or bleeding issues per RN  Plan:  Increase heparin to 1600 units/hr Recheck heparin level in 8 hr  Reuel Boom, PharmD, BCPS [phone] 02/12/2021, 7:49 PM

## 2021-02-12 NOTE — Progress Notes (Signed)
Pt started on Cardizem earlier in the shift, tolerating, HR decreased since the start of Cardizem. SRP, RN

## 2021-02-12 NOTE — Progress Notes (Signed)
Initial Nutrition Assessment  DOCUMENTATION CODES:   Not applicable  INTERVENTION:  - will order Ensure Enlive BID, each supplement provides 350 kcal and 20 grams of protein. - will order 30 ml Prosource Plus BID, each supplement provides 100 kcal and 15 grams protein.  - will order 1 tablet multivitamin with minerals/day. - complete NFPE when feasible.    NUTRITION DIAGNOSIS:   Increased nutrient needs related to acute illness, cancer and cancer related treatments, catabolic illness as evidenced by estimated needs.  GOAL:   Patient will meet greater than or equal to 90% of their needs  MONITOR:   PO intake, Supplement acceptance, Labs, Weight trends  REASON FOR ASSESSMENT:   Malnutrition Screening Tool  ASSESSMENT:   75 y.o. male with medical history of seasonal allergy, L eye blindness, COPD, depression, type 2 DM, diverticulosis, glaucoma, HLD, HTN, insomnia, internal and external bleeding hemorrhoids, iron deficiency anemia, osteoarthritis, Parkinson's disease, rosacea, and recently diagnosed with stage 3 lung cancer. He presented to the [facility] for his first cycle of chemo and reported dizziness and on and off palpitation for several weeks; was found to be in aflutter. He was subsequently transferred to the ED.  Unable to see patient at this time. No meal completion percentages documented this hospitalization.   Patient is currently listed as Observation status.   He has not been seen by a [place] RD at any time in the past.   Weight yesterday was 201 lb and weight on 01/14/21 was 210 lb. This indicates 9 lb weight loss (4.2% body weight) in the past 1 month.  Per notes: - new onset aflutter - COVID-19 positive - iron deficiency anemia s/p 1 unit PRBC - mild protein malnutrition--no documentation on indicators   Labs reviewed; CBG: 95 mg/dl.  Medications reviewed; sliding scale novolog, 2 g IV Mg sulfate x1 run 12/19, 500 mg metformin BID, 40 mg oral  protonix/day, 20 mEq Klor-Con BID, 40 mEq Klor-Con x1 dose 12/19.    NUTRITION - FOCUSED PHYSICAL EXAM:  Unable to complete at this time.   Diet Order:   Diet Order             Diet Heart Room service appropriate? Yes; Fluid consistency: Thin  Diet effective now                   EDUCATION NEEDS:   No education needs have been identified at this time  Skin:  Skin Assessment: Reviewed RN Assessment  Last BM:  PTA/unknown  Height:   Ht Readings from Last 1 Encounters:  02/11/21 6\' 1"  (1.854 m)    Weight:   Wt Readings from Last 1 Encounters:  02/11/21 91.3 kg     Estimated Nutritional Needs:  Kcal:  2280-2500 kcal Protein:  115-130 grams Fluid:  >/= 2.4 L/day      Jarome Matin, MS, RD, LDN, CNSC Inpatient Clinical Dietitian RD pager # available in AMION  After hours/weekend pager # available in [facility]

## 2021-02-12 NOTE — Progress Notes (Signed)
Brief oncology note:  I was notified regarding the patient's admission but I am currently off site.  I have reviewed the patient's chart.  Briefly, Mr. Veith is newly diagnosed with stage IIIa/IV non-small cell lung cancer, squamous cell carcinoma.  He was scheduled to begin concurrent chemoradiation with weekly carboplatin for an AUC of 2 and paclitaxel 45 mg per metered squared followed by immunotherapy if he has no evidence for disease progression.  First dose of chemotherapy is currently planned for 12/28.  He was seen in our office yesterday and was actually scheduled to begin his first cycle of chemotherapy on 12/19.  However, he had tachycardia with a heart rate of 160.  He was sent to the emergency department for further evaluation.  He is now admitted for COVID-19 viral infection with concern for COVID infection with bacterial pneumonia as well as new onset A. fib in the setting of COVID-19 viral infection.  He is also experiencing acute on chronic hypoxic respiratory failure secondary to COVID-19 infection with superimposed bacterial pneumonia.  Admission lab work reviewed.  He has microcytic anemia.  Ferritin is low normal at 52.  However, difficult to interpret in the setting of COVID-19 infection.  Iron and TIBC have been ordered and are pending.  If iron or percent saturation low, may benefit from IV iron.  From our standpoint, will begin systemic chemotherapy in accordance with our office policy.  May need to delay chemotherapy appointment in the setting of COVID-19 infection.  We will reach out to the office to confirm.  Radiation oncology has been notified of his admission and COVID-19 status.  They are checking with their director to determine when they can begin radiation.  Mikey Bussing, DNP, AGPCNP-BC, AOCNP

## 2021-02-12 NOTE — Progress Notes (Addendum)
PROGRESS NOTE  James [name] LKG:[id number] DOB: 29-Mar-1945 DOA: 02/11/2021 PCP: [name], MD  HPI/Recap of past 24 hours: James [name] is a 75 y.o. male with medical history significant of chronic hypoxic respiratory failure on 4 L nasal cannula continuously, seasonal allergy, left eye blindness, COPD, depression, type II DM, diverticulosis, glaucoma, hyperlipidemia, hypertension, insomnia, internal and external bleeding hemorrhoids, iron deficiency anemia, osteoarthritis, Parkinson's disease, rosacea, recently diagnosed with stage III lung cancer (last radiation on 02/08/2021), who presented for his first cycle of chemotherapy at the Cancer center but had complaints of dizziness.  He was found to be in atrial flutter and was sent to the ED for further evaluation and management of his symptomatology.    Work-up revealed COVID-19 viral infection, new onset atrial flutter he was started on oral Cardizem and antiviral.  2D echo was ordered, results are pending.  He was seen by cardiology with further recommendations.  02/12/2021: Patient was seen and examined at his bedside.  Reports generalized weakness.  Assessment/Plan: Principal Problem:   New onset atrial flutter (HCC) Active Problems:   COPD with emphysema (HCC)   Iron deficiency anemia   Hypertension   Hyperlipidemia   Diabetes mellitus, type 2 ([place])   Parkinson's disease ([place])   Depression   Mild protein malnutrition ([place])   2019 novel coronavirus detected  COVID-19 viral infection with concern for COVID infection with bacterial pneumonia, POA. Complaint of dizziness on the day of his admission. Thought to be secondary to COVID-19 viral infection which has multi organ system involvement. Received a dose of Paxil on admission.   On 02/12/2021, switched to IV remdesivir, day number 1 out of 5. Continue Rocephin and azithromycin started on admission. Pulmonary toilet Antitussives Incentive spirometer, flutter  valve, maintain O2 saturation above 92%. Vitamin C, thiamine D3, zinc. Trend inflammatory markers.  New onset atrial flutter in the setting of COVID-19 viral infection Is currently on Cardizem drip and heparin drip, started by cardiology. Continue to closely monitor on telemetry. TSH 2D echo  Acute on chronic hypoxic respiratory failure secondary to COVID-19 viral infection with concern for superimposed bacterial pneumonia. At baseline he is on 4 L nasal cannula continuously and on 5 L with ambulation Currently on 4 L to maintain O2 saturation greater than 90%. Continue to treat underlying conditions Will add IV Solu-Medrol 40 mg x5 days. Maintain a saturation greater than 90%. Pulmonary toilet, incentive spirometer, flutter valve.  Newly diagnosed stage III lung cancer post radiation on 02/08/2021 Radiation plan on 02/12/2021 Plan to start chemotherapy, missed first cycle dose on 02/11/2021. Medical oncology, Dr. Julien Nordmann consulted on 02/12/2021.  Anemia of chronic disease in the setting of malignancy Hemoglobin at baseline 8.5, MCV 74. Obtain iron studies in the morning, follow results.  Type 2 diabetes with hyperglycemia, likely will be exacerbated by IV steroids Update hemoglobin A1c Hold off home metformin Continue insulin sliding scale  Chronic anxiety/depression Stable Resume home regimen.  GERD Continue home PPI  Physical debility/ambulatory dysfunction PT OT to assess Fall precautions Mobilize as tolerated Out of bed to chair with every shift.    Code Status: Full code  Family Communication: None at bedside  Disposition Plan: Likely will discharge to home with home health services with cardiologist also.   Consultants: Cardiology Medical oncology  Procedures: 2D echo on 02/12/2021.  Antimicrobials: Rocephin Azithromycin  DVT prophylaxis: Heparin drip.  Status is: Observation         Objective: Vitals:   02/11/21 2115 02/11/21 2250  02/12/21 0317 02/12/21 0821  BP: (!) 135/59  139/61   Pulse: (!) 51  (!) 55   Resp: 18 16 18    Temp: 98.8 F (37.1 C)  98.5 F (36.9 C) 98.5 F (36.9 C)  TempSrc: Oral  Oral Oral  SpO2: 100%  98%   Weight:      Height:        Intake/Output Summary (Last 24 hours) at 02/12/2021 1410 Last data filed at 02/12/2021 0076 Gross per 24 hour  Intake 1112.62 ml  Output --  Net 1112.62 ml   Filed Weights   02/11/21 1430  Weight: 91.3 kg    Exam:  General: 75 y.o. year-old male well developed well nourished in no acute distress.  Alert and oriented x3. Cardiovascular: Regular rate and rhythm with no rubs or gallops.  No thyromegaly or JVD noted.   Respiratory: Clear to auscultation with no wheezes or rales. Good inspiratory effort. Abdomen: Soft nontender nondistended with normal bowel sounds x4 quadrants. Musculoskeletal: No lower extremity edema. 2/4 pulses in all 4 extremities. Skin: No ulcerative lesions noted or rashes, Psychiatry: Mood is appropriate for condition and setting   Data Reviewed: CBC: Recent Labs  Lab 02/11/21 1121 02/11/21 1313 02/12/21 0416  WBC 10.8* 9.2 8.4  NEUTROABS 8.4*  --  6.6  HGB 8.0* 7.8* 8.5*  HCT 29.0* 29.5* 31.2*  MCV 70.4* 71.6* 74.6*  PLT 524* 463* 226*   Basic Metabolic Panel: Recent Labs  Lab 02/11/21 1121 02/11/21 1313 02/12/21 0416  NA 142 139 141  K 3.7 3.5 4.1  CL 103 102 103  CO2 27 28 30   GLUCOSE 131* 108* 102*  BUN 13 15 13   CREATININE 0.90 0.87 0.79  CALCIUM 9.7 9.3 9.2  MG  --  1.9 2.1  PHOS  --  3.3 3.3   GFR: Estimated Creatinine Clearance: 90.2 mL/min (by C-G formula based on SCr of 0.79 mg/dL). Liver Function Tests: Recent Labs  Lab 02/11/21 1121 02/11/21 1313 02/12/21 0416  AST 9* 10* 11*  ALT <5 8 <5  ALKPHOS 74 65 62  BILITOT 0.4 0.6 0.7  PROT 7.0 7.1 6.4*  ALBUMIN 3.1* 3.3* 3.1*   No results for input(s): LIPASE, AMYLASE in the last 168 hours. No results for input(s): AMMONIA in the last  168 hours. Coagulation Profile: Recent Labs  Lab 02/12/21 1059  INR 1.1   Cardiac Enzymes: No results for input(s): CKTOTAL, CKMB, CKMBINDEX, TROPONINI in the last 168 hours. BNP (last 3 results) No results for input(s): PROBNP in the last 8760 hours. HbA1C: Recent Labs    02/12/21 0416  HGBA1C 5.8*   CBG: Recent Labs  Lab 02/11/21 2202 02/12/21 0808 02/12/21 1158  GLUCAP 107* 95 118*   Lipid Profile: No results for input(s): CHOL, HDL, LDLCALC, TRIG, CHOLHDL, LDLDIRECT in the last 72 hours. Thyroid Function Tests: No results for input(s): TSH, T4TOTAL, FREET4, T3FREE, THYROIDAB in the last 72 hours. Anemia Panel: Recent Labs    02/11/21 2225 02/12/21 0416  FERRITIN 51 52   Urine analysis: No results found for: COLORURINE, APPEARANCEUR, LABSPEC, PHURINE, GLUCOSEU, HGBUR, BILIRUBINUR, KETONESUR, PROTEINUR, UROBILINOGEN, NITRITE, LEUKOCYTESUR Sepsis Labs: @LABRCNTIP (procalcitonin:4,lacticidven:4)  ) Recent Results (from the past 240 hour(s))  Resp Panel by RT-PCR (Flu A&B, Covid) Nasopharyngeal Swab     Status: Abnormal   Collection Time: 02/11/21  3:26 PM   Specimen: Nasopharyngeal Swab; Nasopharyngeal(NP) swabs in vial transport medium  Result Value Ref Range Status   SARS Coronavirus 2 by RT PCR  POSITIVE (A) NEGATIVE Final    Comment: (NOTE) SARS-CoV-2 target nucleic acids are DETECTED.  The SARS-CoV-2 RNA is generally detectable in upper respiratory specimens during the acute phase of infection. Positive results are indicative of the presence of the identified virus, but do not rule out bacterial infection or co-infection with other pathogens not detected by the test. Clinical correlation with patient history and other diagnostic information is necessary to determine patient infection status. The expected result is Negative.  Fact Sheet for Patients: EntrepreneurPulse.com.au  Fact Sheet for Healthcare  Providers: IncredibleEmployment.be  This test is not yet approved or cleared by the [place] FDA and  has been authorized for detection and/or diagnosis of SARS-CoV-2 by FDA under an Emergency Use Authorization (EUA).  This EUA will remain in effect (meaning this test can be used) for the duration of  the COVID-19 declaration under Section 564(b)(1) of the A ct, 21 U.S.C. section 360bbb-3(b)(1), unless the authorization is terminated or revoked sooner.     Influenza A by PCR NEGATIVE NEGATIVE Final   Influenza B by PCR NEGATIVE NEGATIVE Final    Comment: (NOTE) The Xpert Xpress SARS-CoV-2/FLU/RSV plus assay is intended as an aid in the diagnosis of influenza from Nasopharyngeal swab specimens and should not be used as a sole basis for treatment. Nasal washings and aspirates are unacceptable for Xpert Xpress SARS-CoV-2/FLU/RSV testing.  Fact Sheet for Patients: EntrepreneurPulse.com.au  Fact Sheet for Healthcare Providers: IncredibleEmployment.be  This test is not yet approved or cleared by the [place] FDA and has been authorized for detection and/or diagnosis of SARS-CoV-2 by FDA under an Emergency Use Authorization (EUA). This EUA will remain in effect (meaning this test can be used) for the duration of the COVID-19 declaration under Section 564(b)(1) of the Act, 21 U.S.C. section 360bbb-3(b)(1), unless the authorization is terminated or revoked.  Performed at [facility], [place] [address] [place], [place] [address]       Studies: ECHOCARDIOGRAM COMPLETE  Result Date: 02/12/2021    ECHOCARDIOGRAM REPORT   Patient Name:   LYNDELL ALLAIRE Date of Exam: 02/12/2021 Medical Rec #:  [id number]         Height:       73.0 in Accession #:    [id number]        Weight:       201.2 lb Date of Birth:  07-Jan-1946          BSA:          2.157 m Patient Age:    47 years          BP:           139/61 mmHg  Patient Gender: M                 HR:           108 bpm. Exam Location:  Inpatient Procedure: 2D Echo, Cardiac Doppler and Color Doppler Indications:    Aflutter  History:        Patient has prior history of Echocardiogram examinations, most                 recent 06/06/2020. COPD, Arrythmias:Tachycardia,                 Signs/Symptoms:Chest Pain; Risk Factors:Hypertension and                 Diabetes.  Sonographer:    Glo Herring Referring Phys: [id number] [place]  1. Left ventricular ejection fraction, by estimation, is 55 to 60%. The left ventricle has normal function. The left ventricle has no regional wall motion abnormalities. There is mild concentric left ventricular hypertrophy. Left ventricular diastolic function could not be evaluated.  2. Right ventricular systolic function is normal. The right ventricular size is normal. There is normal pulmonary artery systolic pressure.  3. Left atrial size was mild to moderately dilated.  4. Right atrial size was mild to moderately dilated.  5. A small pericardial effusion is present. There is no evidence of cardiac tamponade.  6. The mitral valve is normal in structure. No evidence of mitral valve regurgitation. No evidence of mitral stenosis.  7. The aortic valve is grossly normal. Aortic valve regurgitation is not visualized. No aortic stenosis is present.  8. There is mild dilatation of the ascending aorta, measuring 40 mm.  9. The inferior vena cava is dilated in size with >50% respiratory variability, suggesting right atrial pressure of 8 mmHg. Comparison(s): Changes from prior study are noted. Now in atrial flutter. Conclusion(s)/Recommendation(s): Otherwise normal echocardiogram, with minor abnormalities described in the report. In atrial flutter throughout the study. FINDINGS  Left Ventricle: Left ventricular ejection fraction, by estimation, is 55 to 60%. The left ventricle has normal function. The left ventricle has no regional wall  motion abnormalities. The left ventricular internal cavity size was normal in size. There is  mild concentric left ventricular hypertrophy. Left ventricular diastolic function could not be evaluated due to nondiagnostic images. Left ventricular diastolic function could not be evaluated. Right Ventricle: The right ventricular size is normal. No increase in right ventricular wall thickness. Right ventricular systolic function is normal. There is normal pulmonary artery systolic pressure. The tricuspid regurgitant velocity is 2.45 m/s, and  with an assumed right atrial pressure of 8 mmHg, the estimated right ventricular systolic pressure is 75.9 mmHg. Left Atrium: Left atrial size was mild to moderately dilated. Right Atrium: Right atrial size was mild to moderately dilated. Pericardium: A small pericardial effusion is present. There is no evidence of cardiac tamponade. Mitral Valve: The mitral valve is normal in structure. No evidence of mitral valve regurgitation. No evidence of mitral valve stenosis. Tricuspid Valve: The tricuspid valve is normal in structure. Tricuspid valve regurgitation is trivial. No evidence of tricuspid stenosis. Aortic Valve: The aortic valve is grossly normal. Aortic valve regurgitation is not visualized. No aortic stenosis is present. Aortic valve mean gradient measures 4.0 mmHg. Aortic valve peak gradient measures 8.0 mmHg. Aortic valve area, by VTI measures 2.87 cm. Pulmonic Valve: The pulmonic valve was not well visualized. Pulmonic valve regurgitation is not visualized. Aorta: The aortic root, ascending aorta, aortic arch and descending aorta are all structurally normal, with no evidence of dilitation or obstruction. There is mild dilatation of the ascending aorta, measuring 40 mm. Venous: The inferior vena cava is dilated in size with greater than 50% respiratory variability, suggesting right atrial pressure of 8 mmHg. IAS/Shunts: The atrial septum is grossly normal.  LEFT VENTRICLE  PLAX 2D LVIDd:         4.70 cm      Diastology LVIDs:         3.20 cm      LV e' medial:    8.49 cm/s LV PW:         1.20 cm      LV E/e' medial:  9.8 LV IVS:        1.20 cm      LV e'  lateral:   12.13 cm/s LVOT diam:     2.00 cm      LV E/e' lateral: 6.9 LV SV:         72 LV SV Index:   33 LVOT Area:     3.14 cm  LV Volumes (MOD) LV vol d, MOD A2C: 105.0 ml LV vol d, MOD A4C: 93.3 ml LV vol s, MOD A2C: 46.5 ml LV vol s, MOD A4C: 42.4 ml LV SV MOD A2C:     58.5 ml LV SV MOD A4C:     93.3 ml LV SV MOD BP:      56.7 ml RIGHT VENTRICLE             IVC RV Basal diam:  4.30 cm     IVC diam: 2.50 cm RV S prime:     14.60 cm/s LEFT ATRIUM             Index        RIGHT ATRIUM           Index LA diam:        4.60 cm 2.13 cm/m   RA Area:     28.00 cm LA Vol (A2C):   86.3 ml 40.01 ml/m  RA Volume:   93.50 ml  43.34 ml/m LA Vol (A4C):   56.7 ml 26.29 ml/m LA Biplane Vol: 70.6 ml 32.73 ml/m  AORTIC VALVE                    PULMONIC VALVE AV Area (Vmax):    2.87 cm     PV Vmax:       0.99 m/s AV Area (Vmean):   2.69 cm     PV Peak grad:  3.9 mmHg AV Area (VTI):     2.87 cm AV Vmax:           141.67 cm/s AV Vmean:          94.533 cm/s AV VTI:            0.249 m AV Peak Grad:      8.0 mmHg AV Mean Grad:      4.0 mmHg LVOT Vmax:         129.33 cm/s LVOT Vmean:        80.867 cm/s LVOT VTI:          0.228 m LVOT/AV VTI ratio: 0.91  AORTA Ao Root diam: 4.00 cm Ao Asc diam:  3.40 cm MITRAL VALVE               TRICUSPID VALVE MV Area (PHT): 5.08 cm    TR Peak grad:   24.0 mmHg MV Decel Time: 149 msec    TR Vmax:        245.00 cm/s MV E velocity: 83.13 cm/s                            SHUNTS                            Systemic VTI:  0.23 m                            Systemic Diam: 2.00 cm Buford Dresser MD Electronically signed by Buford Dresser MD Signature Date/Time: 02/12/2021/11:39:35 AM    Final     Scheduled Meds:  (  feeding supplement) PROSource Plus  30 mL Oral BID BM   vitamin C  500 mg Oral Daily    atorvastatin  20 mg Oral QHS   benzonatate  200 mg Oral TID   buPROPion  150 mg Oral BID   carbidopa-levodopa  1.5 tablet Oral TID   cholecalciferol  1,000 Units Oral Daily   donepezil  10 mg Oral QHS   feeding supplement  237 mL Oral BID BM   ferrous gluconate  324 mg Oral Q breakfast   insulin aspart  0-15 Units Subcutaneous TID WC   Ipratropium-Albuterol  1 puff Inhalation Q6H   methylPREDNISolone (SOLU-MEDROL) injection  40 mg Intravenous Daily   mirtazapine  15 mg Oral QHS   modafinil  100 mg Oral Daily   multivitamin with minerals  1 tablet Oral Daily   pantoprazole  40 mg Oral Daily   potassium chloride SA  20 mEq Oral BID   sertraline  150 mg Oral Daily   zinc sulfate  220 mg Oral Daily    Continuous Infusions:  azithromycin 500 mg (02/11/21 2205)   cefTRIAXone (ROCEPHIN)  IV 2 g (02/11/21 2128)   diltiazem (CARDIZEM) infusion 5 mg/hr (02/12/21 1216)   heparin 1,300 Units/hr (02/12/21 1240)   remdesivir 200 mg in sodium chloride 0.9% 250 mL IVPB     Followed by   Derrill Memo ON 02/13/2021] remdesivir 100 mg in NS 100 mL       LOS: 0 days     Kayleen Memos, MD [facility] Pager [phone]  If 7PM-7AM, please contact night-coverage [email or web address] Password [email or web address] 02/12/2021, 2:10 PM

## 2021-02-13 ENCOUNTER — Ambulatory Visit
Admission: RE | Admit: 2021-02-13 | Discharge: 2021-02-13 | Disposition: A | Discharge status: Home / Self Care | Payer: Medicare Other | Source: Ambulatory Visit | Attending: Radiation Oncology | Admitting: Radiation Oncology

## 2021-02-13 DIAGNOSIS — U071 COVID-19: Principal | ICD-10-CM

## 2021-02-13 DIAGNOSIS — J9621 Acute and chronic respiratory failure with hypoxia: Secondary | ICD-10-CM

## 2021-02-13 LAB — IRON AND TIBC
Iron: 16 ug/dL — ABNORMAL LOW (ref 45–182)
Saturation Ratios: 5 % — ABNORMAL LOW (ref 17.9–39.5)
TIBC: 325 ug/dL (ref 250–450)
UIBC: 309 ug/dL

## 2021-02-13 LAB — CBC WITH DIFFERENTIAL/PLATELET
Abs Immature Granulocytes: 0.03 10*3/uL (ref 0.00–0.07)
Basophils Absolute: 0 10*3/uL (ref 0.0–0.1)
Basophils Relative: 0 %
Eosinophils Absolute: 0 10*3/uL (ref 0.0–0.5)
Eosinophils Relative: 0 %
HCT: 30.6 % — ABNORMAL LOW (ref 39.0–52.0)
Hemoglobin: 8.7 g/dL — ABNORMAL LOW (ref 13.0–17.0)
Immature Granulocytes: 1 %
Lymphocytes Relative: 7 %
Lymphs Abs: 0.3 10*3/uL — ABNORMAL LOW (ref 0.7–4.0)
MCH: 20.1 pg — ABNORMAL LOW (ref 26.0–34.0)
MCHC: 28.4 g/dL — ABNORMAL LOW (ref 30.0–36.0)
MCV: 70.8 fL — ABNORMAL LOW (ref 80.0–100.0)
Monocytes Absolute: 0.2 10*3/uL (ref 0.1–1.0)
Monocytes Relative: 3 %
Neutro Abs: 4.3 10*3/uL (ref 1.7–7.7)
Neutrophils Relative %: 89 %
Platelets: 478 10*3/uL — ABNORMAL HIGH (ref 150–400)
RBC: 4.32 MIL/uL (ref 4.22–5.81)
RDW: 17.9 % — ABNORMAL HIGH (ref 11.5–15.5)
WBC: 4.8 10*3/uL (ref 4.0–10.5)
nRBC: 0 % (ref 0.0–0.2)

## 2021-02-13 LAB — COMPREHENSIVE METABOLIC PANEL
ALT: 7 U/L (ref 0–44)
AST: 12 U/L — ABNORMAL LOW (ref 15–41)
Albumin: 3.1 g/dL — ABNORMAL LOW (ref 3.5–5.0)
Alkaline Phosphatase: 62 U/L (ref 38–126)
Anion gap: 10 (ref 5–15)
BUN: 16 mg/dL (ref 8–23)
CO2: 27 mmol/L (ref 22–32)
Calcium: 9.3 mg/dL (ref 8.9–10.3)
Chloride: 100 mmol/L (ref 98–111)
Creatinine, Ser: 0.82 mg/dL (ref 0.61–1.24)
GFR, Estimated: >60 mL/min (ref >60)
Glucose, Bld: 164 mg/dL — ABNORMAL HIGH (ref 70–99)
Potassium: 4.3 mmol/L (ref 3.5–5.1)
Sodium: 137 mmol/L (ref 135–145)
Total Bilirubin: 0.5 mg/dL (ref 0.3–1.2)
Total Protein: 6.8 g/dL (ref 6.5–8.1)

## 2021-02-13 LAB — GLUCOSE, CAPILLARY
Glucose-Capillary: 177 mg/dL — ABNORMAL HIGH (ref 70–99)
Glucose-Capillary: 206 mg/dL — ABNORMAL HIGH (ref 70–99)
Glucose-Capillary: 240 mg/dL — ABNORMAL HIGH (ref 70–99)
Glucose-Capillary: 168 mg/dL — ABNORMAL HIGH (ref 70–99)

## 2021-02-13 LAB — HEPARIN LEVEL (UNFRACTIONATED): Heparin Unfractionated: <0.1 IU/mL — ABNORMAL LOW (ref 0.30–0.70)

## 2021-02-13 LAB — C-REACTIVE PROTEIN: CRP: 9.9 mg/dL — ABNORMAL HIGH (ref <1.0)

## 2021-02-13 LAB — FERRITIN: Ferritin: 78 ng/mL (ref 24–336)

## 2021-02-13 LAB — TSH: TSH: 0.224 u[IU]/mL — ABNORMAL LOW (ref 0.350–4.500)

## 2021-02-13 LAB — D-DIMER, QUANTITATIVE: D-Dimer, Quant: 2.36 ug/mL-FEU — ABNORMAL HIGH (ref 0.00–0.50)

## 2021-02-13 LAB — MAGNESIUM: Magnesium: 2.1 mg/dL (ref 1.7–2.4)

## 2021-02-13 LAB — PHOSPHORUS: Phosphorus: 3.3 mg/dL (ref 2.5–4.6)

## 2021-02-13 LAB — T4, FREE: Free T4: 1.31 ng/dL — ABNORMAL HIGH (ref 0.61–1.12)

## 2021-02-13 MED ORDER — HEPARIN BOLUS VIA INFUSION
3000.0000 [IU] | Freq: Once | INTRAVENOUS | Status: AC
  Administered 2021-02-13: 06:00:00 3000 [IU] via INTRAVENOUS
  Filled 2021-02-13: qty 3000

## 2021-02-13 MED ORDER — DEXAMETHASONE 4 MG PO TABS
6.0000 mg | ORAL_TABLET | Freq: Every day | ORAL | Status: DC
  Administered 2021-02-14 – 2021-02-16 (×3): 6 mg via ORAL
  Filled 2021-02-13 (×3): qty 1

## 2021-02-13 MED ORDER — APIXABAN 5 MG PO TABS
5.0000 mg | ORAL_TABLET | Freq: Two times a day (BID) | ORAL | Status: DC
  Administered 2021-02-13 – 2021-02-16 (×7): 5 mg via ORAL
  Filled 2021-02-13 (×7): qty 1

## 2021-02-13 MED ORDER — METHYLPREDNISOLONE SODIUM SUCC 40 MG IJ SOLR: 40.0000 mg | Freq: Every day | INTRAMUSCULAR | Status: DC

## 2021-02-13 MED ORDER — DILTIAZEM HCL ER COATED BEADS 180 MG PO CP24
180.0000 mg | ORAL_CAPSULE | Freq: Every day | ORAL | Status: DC
  Administered 2021-02-13: 12:00:00 180 mg via ORAL
  Filled 2021-02-13 (×2): qty 1

## 2021-02-13 MED ORDER — DILTIAZEM HCL 25 MG/5ML IV SOLN
10.0000 mg | Freq: Once | INTRAVENOUS | Status: AC
  Administered 2021-02-13: 21:00:00 10 mg via INTRAVENOUS
  Filled 2021-02-13: qty 5

## 2021-02-13 NOTE — Progress Notes (Signed)
[place] for Heparin Indication: Atrial Flutter  Allergies  Allergen Reactions   Aspirin Other (See Comments)    GI bleed   Codeine Itching   Morphine Itching    Patient Measurements: Height: 6\' 1"  (185.4 cm) Weight: 91.3 kg (201 lb 3.2 oz) IBW/kg (Calculated) : 79.9 Heparin Dosing Weight: 83.3 kg  Vital Signs: Temp: 97.9 F (36.6 C) (12/21 0506) Temp Source: Oral (12/21 0506) BP: 136/76 (12/21 0506) Pulse Rate: 50 (12/21 0506)  Labs: Recent Labs    02/11/21 1313 02/12/21 0416 02/12/21 1059 02/12/21 1849 02/13/21 0439  HGB 7.8* 8.5*  --   --  8.7*  HCT 29.5* 31.2*  --   --  30.6*  PLT 463* 412*  --   --  478*  APTT  --   --  30  --   --   LABPROT  --   --  14.6  --   --   INR  --   --  1.1  --   --   HEPARINUNFRC  --   --   --  <0.10* <0.10*  CREATININE 0.87 0.79  --   --  0.82     Estimated Creatinine Clearance: 88 mL/min (by C-G formula based on SCr of 0.82 mg/dL).   Medical History: Past Medical History:  Diagnosis Date   Allergy    seasonal   Blind left eye    legally   Blood transfusion    2002   COPD (chronic obstructive pulmonary disease) (HCC)    Depression    Diabetes mellitus, type 2 (HCC)    Diverticulosis    Dyspnea    ED (erectile dysfunction)    Glaucoma    Hyperlipidemia    Hypertension    Insomnia    Internal and external bleeding hemorrhoids    Iron deficiency anemia    Osteoarthritis    Parkinson's disease ([place]) 2014   Dr. [name] PCP  and Dr. [name] at [facility]   Rosacea    Tremor of both hands    Tubular adenoma of colon 03/28/2011    Medications:  Scheduled:   (feeding supplement) PROSource Plus  30 mL Oral BID BM   vitamin C  500 mg Oral Daily   atorvastatin  20 mg Oral QHS   benzonatate  200 mg Oral TID   buPROPion  150 mg Oral BID   carbidopa-levodopa  1.5 tablet Oral TID   cholecalciferol  1,000 Units Oral Daily   donepezil  10 mg Oral QHS   feeding supplement  237 mL Oral  BID BM   ferrous gluconate  324 mg Oral Q breakfast   insulin aspart  0-15 Units Subcutaneous TID WC   Ipratropium-Albuterol  1 puff Inhalation Q6H   methylPREDNISolone (SOLU-MEDROL) injection  40 mg Intravenous Daily   mirtazapine  15 mg Oral QHS   modafinil  100 mg Oral Daily   multivitamin with minerals  1 tablet Oral Daily   pantoprazole  40 mg Oral Daily   potassium chloride SA  20 mEq Oral BID   sertraline  150 mg Oral Daily   zinc sulfate  220 mg Oral Daily   Infusions:   azithromycin 500 mg (02/12/21 2300)   cefTRIAXone (ROCEPHIN)  IV 2 g (02/12/21 2138)   diltiazem (CARDIZEM) infusion 5 mg/hr (02/12/21 2259)   heparin 1,600 Units/hr (02/12/21 2129)   remdesivir 100 mg in NS 100 mL     PRN: acetaminophen **OR** acetaminophen, HYDROcodone  bit-homatropine, nitroGLYCERIN, prochlorperazine, traMADol  Assessment: 75 y/o M recently diagnosed with stage III lung cancer, presented for first cycle of chemotherapy at [facility], was noted to be dizzy and found to be in atrial flutter; admitted through ED.  Orders received from cardiology to begin heparin infusion.  Plans noted for transition to [place] if Hct and Hgb remain acceptable on IV heparin.  02/13/2021: Heparin level <0.1 on 1600 units/hr CBC: Hg low/stable at 8.7, pltc WNL No bleeding or infusion related issues reported by RN  Goal of Therapy:  Heparin level 0.3-0.7 units/ml Monitor platelets by anticoagulation protocol: Yes   Plan:  --Heparin 3000 units IV bolus - Increase Heparin infusion to 1900 units/hr IV  -Check heparin level in 8 hours -Heparin level, CBC daily -Follow for any reports of bleeding  Netta Cedars, PharmD, [place] [phone] 02/13/2021  5:39 AM

## 2021-02-13 NOTE — Assessment & Plan Note (Addendum)
-   On 4 L chronically at home at rest and 5 L with ambulation - Continue O2, currently at baseline - Continue steroids as per above

## 2021-02-13 NOTE — Hospital Course (Signed)
James [name] is a 75 y.o. male with PMH chronic hypoxic respiratory failure on 4 L O2 at home, allergies, left eye blindness, COPD, depression, DM II, diverticulosis, glaucoma, HLD, HTN, insomnia, hemorrhoids, iron deficiency anemia, OA, Parkinson's disease who was recently diagnosed with stage III left-sided lung cancer.  James [name] has recently been started on radiation outpatient with plans for chemotherapy to start on 02/20/2021. James [name] was undergoing evaluation at the cancer center prior to admission when James [name] was found to have elevated heart rate and was sent to the ER for further evaluation.  James [name] was then found to be in new onset atrial fibrillation with RVR.  James [name] was admitted for further work-up and cardiology evaluation.

## 2021-02-13 NOTE — Assessment & Plan Note (Addendum)
-   At risk for progression of disease given underlying malignancy and immunocompromised state - started on rocephin and azithro empirically as well due to concern for risk of bacterial infection - Given drug interactions, remdesivir is preferred drug of choice at this time.  Completed 5 day course of remdesivir on 12/24 -Remainder of Decadron course prescribed at discharge

## 2021-02-13 NOTE — Assessment & Plan Note (Addendum)
-   Felt to be precipitated by underlying COVID infection and possibly from underlying cancer and recent radiation treatments and/or abnormal thyroid - Initially started on Cardizem drip and heparin drip - Cardiology following, assistance appreciated - Now is transitioned to Eliquis and cardizem PO - did require cardizem overnight of 12/21 due to RVR; his cardizem dose was increased further per cardiology to 300 mg daily - HR still uncontrolled and sustained with rates in the 130s to 140s prior to discharge but trial of Lopressor IV given with good response and HR back to 80s, and since blood pressure already low/normal on his dose of Cardizem, therefore adding low-dose Lopressor at discharge and parameters given for when to hold; he will need to follow-up closely with cardiology after discharge for any further adjustments - regimen at discharge: Cardizem 300 mg daily and Lopressor 12.5 mg BID - see abnormal TSH also

## 2021-02-13 NOTE — Progress Notes (Addendum)
Pt transported to Radiation. SRP, RN

## 2021-02-13 NOTE — Evaluation (Signed)
Occupational Therapy Evaluation Patient Details Name: James [name] MRN: [id number] DOB: 07-18-1945 Today's Date: 02/13/2021   History of Present Illness Pt is a 75 year old male who presented for his first chemo cycle (stage III lung cancer), had c/o dizziness and sent to ED. Work-up revealed COVID-19 viral infection, new onset atrial flutter he was started on oral Cardizem and antiviral. PMH significant for chronic hypoxic respiratory failure on 4 L nasal cannula continuously, seasonal allergy, left eye blindness, COPD, depression, type II DM, diverticulosis, glaucoma, hyperlipidemia, hypertension, insomnia, internal and external bleeding hemorrhoids, iron deficiency anemia, osteoarthritis, Parkinson's disease, rosacea, recently diagnosed with stage III lung cancer (last radiation on 02/08/2021),   Clinical Impression   Chart reviweed, pt greeted at edge of bed with PT, agreeable to OT evaluation. Pt is alert and orieted x4, frequent vcs required throughout for safe use of RW during mobility. PTA pt reports assist from wife PRN with ADL/IADL. Pt unsteady with RW on this date requiring +2 for safety during functional mobility. Pt performs LB dressing with MIN A, grooming at edge of bed with SET UP. Pt educated on home safety, will benefit from ongoing education. Pt is left semi supine in bed, NAD, all needs met. OT recommends discharge home with HHOT, BSC, hospital bed; Recommend pt sleep on first floor if able, sink bathe at 1/2 bath on first floor. Pt in agreement with recommendations, OT will continue to follow.     Recommendations for follow up therapy are one component of a multi-disciplinary discharge planning process, led by the attending physician.  Recommendations may be updated based on patient status, additional functional criteria and insurance authorization.   Follow Up Recommendations  Home health OT    Assistance Recommended at Discharge Frequent or constant  Supervision/Assistance  Functional Status Assessment  Patient has had a recent decline in their functional status and demonstrates the ability to make significant improvements in function in a reasonable and predictable amount of time.  Equipment Recommendations  BSC/3in1;Hospital bed    Recommendations for Other Services       Precautions / Restrictions Precautions Precautions: Fall Restrictions Weight Bearing Restrictions: No      Mobility Bed Mobility Overal bed mobility: Needs Assistance             General bed mobility comments: recieved at edge of bed    Transfers Overall transfer level: Needs assistance Equipment used: Rolling walker (2 wheels) Transfers: Sit to/from Stand Sit to Stand: Min guard           General transfer comment: frequent vcs for safety      Balance Overall balance assessment: Needs assistance Sitting-balance support: Feet supported Sitting balance-Leahy Scale: Good     Standing balance support: Bilateral upper extremity supported;During functional activity;Reliant on assistive device for balance Standing balance-Leahy Scale: Fair                             ADL either performed or assessed with clinical judgement   ADL Overall ADL's : Needs assistance/impaired Eating/Feeding: Set up   Grooming: Wash/dry face;Set up;Sitting Grooming Details (indicate cue type and reason): at edge of bed             Lower Body Dressing: Sit to/from stand;Minimal assistance Lower Body Dressing Details (indicate cue type and reason): underwear Toilet Transfer: Minimal assistance;+2 for physical assistance Toilet Transfer Details (indicate cue type and reason): simulated Toileting- Clothing Manipulation and Hygiene: Moderate assistance  Functional mobility during ADLs: Min guard;+2 for safety/equipment;Minimal assistance General ADL Comments: performing below PLOF per pt report     Vision Patient Visual Report: No  change from baseline       Perception     Praxis      Pertinent Vitals/Pain Pain Assessment: Faces Pain Score: 3  Pain Location: generalized Pain Intervention(s): Limited activity within patient's tolerance;Repositioned;Monitored during session     Hand Dominance     Extremity/Trunk Assessment Upper Extremity Assessment Upper Extremity Assessment: Generalized weakness           Communication     Cognition Arousal/Alertness: Awake/alert Behavior During Therapy: WFL for tasks assessed/performed Overall Cognitive Status: Within Functional Limits for tasks assessed                                 General Comments: Pt with fair safety awareness requiring frequent vcs for safe use of RW     General Comments  HR up to 147 with activity, SPO2 >95% throughout.    Exercises Other Exercises Other Exercises: education re: role of OT, role of rehab, safety with mobility, ADL safety   Shoulder Instructions      Home Living Family/patient expects to be discharged to:: Private residence Living Arrangements: Spouse/significant other Available Help at Discharge: Available 24 hours/day;Family Type of Home: House       Home Layout: Two level Alternate Level Stairs-Number of Steps: flight             Home Equipment: Advice worker (2 wheels);Wheelchair - manual          Prior Functioning/Environment Prior Level of Function : Needs assist       Physical Assist : ADLs (physical)   ADLs (physical): Dressing;IADLs   ADLs Comments: asssit from wife PRN due to [place]        OT Problem List: Decreased strength;Decreased activity tolerance;Decreased knowledge of use of DME or AE;Impaired balance (sitting and/or standing);Decreased safety awareness      OT Treatment/Interventions: Self-care/ADL training;DME and/or AE instruction;Therapeutic activities;Balance training;Therapeutic exercise;Cognitive remediation/compensation;Patient/family  education    OT Goals(Current goals can be found in the care plan section) Acute Rehab OT Goals Patient Stated Goal: to go home OT Goal Formulation: With patient Time For Goal Achievement: 02/27/21 Potential to Achieve Goals: Good ADL Goals Pt Will Perform Grooming: with set-up;standing Pt Will Perform Lower Body Dressing: with modified independence;sit to/from stand Pt Will Transfer to Toilet: with supervision;bedside commode Pt Will Perform Toileting - Clothing Manipulation and hygiene: with supervision;sit to/from stand  OT Frequency: Min 2X/week   Barriers to D/C:    increased physical assist required for ADL/mobility compared to PLOF       Co-evaluation PT/OT/SLP Co-Evaluation/Treatment: Yes Reason for Co-Treatment: For patient/therapist safety;To address functional/ADL transfers   OT goals addressed during session: ADL's and self-care      AM-PAC OT "6 Clicks" Daily Activity     Outcome Measure Help from another person eating meals?: None Help from another person taking care of personal grooming?: A Little Help from another person toileting, which includes using toliet, bedpan, or urinal?: A Little Help from another person bathing (including washing, rinsing, drying)?: A Little Help from another person to put on and taking off regular upper body clothing?: A Little Help from another person to put on and taking off regular lower body clothing?: A Lot 6 Click Score: 18   End of Session Equipment  Utilized During Treatment: Gait belt;Rolling walker (2 wheels) Nurse Communication: Mobility status  Activity Tolerance: Patient tolerated treatment well Patient left: in bed;with call bell/phone within reach;with bed alarm set  OT Visit Diagnosis: Unsteadiness on feet (R26.81);Other abnormalities of gait and mobility (R26.89);Repeated falls (R29.6)                Time: 9093-1121 OT Time Calculation (min): 22 min Charges:  OT General Charges $OT Visit: 1 Visit OT  Evaluation $OT Eval Moderate Complexity: 1 Mod  Shanon Payor, OTD OTR/L  02/13/21, 2:24 PM

## 2021-02-13 NOTE — Assessment & Plan Note (Signed)
-   continue nebs/breathing treatments - continue IS and flutter

## 2021-02-13 NOTE — Assessment & Plan Note (Addendum)
-   A1c 5.8% on 02/12/2021 - continue SSI and CBG monitoring

## 2021-02-13 NOTE — Assessment & Plan Note (Signed)
-   follows outpatient with oncology - started on radiation within past 1-2 weeks and currently continuing radiation in hospital - Chemo was to begin on 02/20/2021 as well

## 2021-02-13 NOTE — Plan of Care (Signed)

## 2021-02-13 NOTE — Progress Notes (Signed)
Progress Note    James [name]   JYN:[id number]  DOB: 02/03/1946  DOA: 02/11/2021     1 PCP: [name], MD  Initial CC: elevated HR  Hospital Course: James [name] is a 75 y.o. male with PMH chronic hypoxic respiratory failure on 4 L O2 at home, allergies, left eye blindness, COPD, depression, DM II, diverticulosis, glaucoma, HLD, HTN, insomnia, hemorrhoids, iron deficiency anemia, OA, Parkinson's disease who was recently diagnosed with stage III left-sided lung cancer.  He has recently been started on radiation outpatient with plans for chemotherapy to start on 02/20/2021. He was undergoing evaluation at the cancer center prior to admission when he was found to have elevated heart rate and was sent to the ER for further evaluation.  He was then found to be in new onset atrial fibrillation with RVR.  He was admitted for further work-up and cardiology evaluation.  Interval History:  Seen this morning in his room.  He was comfortable and resting in bed.  Breathing was baseline.  Denied any chest pain or palpitations.  Assessment & Plan: * New onset atrial flutter (HCC) - Felt to be precipitated by underlying COVID infection and possibly from underlying cancer and recent radiation treatments - Initially started on Cardizem drip and heparin drip - Cardiology following, patient assistance - Now is transitioned to Eliquis and cardizem PO  COVID-19 virus infection - At risk for progression of disease given underlying malignancy and immunocompromised state - started on rocephin and azithro empirically as well due to concern for risk of bacterial infection - Given drug interactions, remdesivir is preferred drug of choice at this time.  Will need at least 3-day course and we will monitor his response clinically -Trend inflammatory markers -Continue steroids as well given chronic hypoxia at home  Acute on chronic respiratory failure with hypoxia (HCC) - On 4 L chronically at home at  rest and 5 L with ambulation - Continue O2, currently at baseline - Continue steroids  Iron deficiency anemia - Iron stores low.  Agree he likely needs iron transfusion but will hold off in setting of acute infection and can consider infusion towards end of hospitalization  Primary cancer of left lower lobe of lung ([place]) - follows outpatient with oncology - started on radiation within past 1-2 weeks and currently continuing radiation in hospital - Chemo was to begin on 02/20/2021 as well  COPD with emphysema ([place]) - continue nebs/breathing treatments - continue IS and flutter   Depression - Continue sertraline  Parkinson's disease ([place]) - Continue Sinemet  Diabetes mellitus, type 2 ([place]) - follow up A1c - continue SSI and CBG monitoring   Hyperlipidemia - hold treatment for now  Hypertension - Continue Cardizem    Old records reviewed in assessment of this patient  Antimicrobials: Remdesivir 02/13/2021 >> current  DVT prophylaxis: Eliquis  Code Status:   Code Status: Full Code  Disposition Plan: Home Status is: Inpatient  Objective: Blood pressure (!) 153/119, pulse 76, temperature 98.1 F (36.7 C), temperature source Oral, resp. rate 18, height 6\' 1"  (1.854 m), weight 91.3 kg, SpO2 98 %.  Examination:  Physical Exam Constitutional:      General: He is not in acute distress.    Appearance: Normal appearance.  HENT:     Head: Normocephalic and atraumatic.     Mouth/Throat:     Mouth: Mucous membranes are moist.  Eyes:     Extraocular Movements: Extraocular movements intact.  Cardiovascular:     Rate and  Rhythm: Normal rate. Rhythm irregular.     Heart sounds: Normal heart sounds.  Pulmonary:     Effort: Pulmonary effort is normal. No respiratory distress.     Breath sounds: Normal breath sounds. No wheezing.  Abdominal:     General: Bowel sounds are normal. There is no distension.     Palpations: Abdomen is soft.     Tenderness: There is no  abdominal tenderness.  Musculoskeletal:        General: Normal range of motion.     Cervical back: Normal range of motion and neck supple.  Skin:    General: Skin is warm and dry.  Neurological:     General: No focal deficit present.     Mental Status: He is alert.  Psychiatric:        Mood and Affect: Mood normal.        Behavior: Behavior normal.     Consultants:  Cardiology  Procedures:    Data Reviewed: I have personally reviewed labs and imaging studies    LOS: 1 day  Time spent: Greater than 50% of the 35 minute visit was spent in counseling/coordination of care for the patient as laid out in the A&P.   Dwyane Dee, MD [facility] 02/13/2021, 6:52 PM

## 2021-02-13 NOTE — Evaluation (Signed)
Physical Therapy Evaluation Patient Details Name: James [name] MRN: [id number] DOB: June 21, 1945 Today's Date: 02/13/2021  History of Present Illness  Pt is a 75 year old male who presented for his first chemo cycle (stage III lung cancer), had c/o dizziness and sent to ED. Work-up revealed COVID-19 viral infection, new onset atrial flutter he was started on oral Cardizem and antiviral. PMH significant for chronic hypoxic respiratory failure on 4 L nasal cannula continuously, seasonal allergy, left eye blindness, COPD, depression, type II DM, diverticulosis, glaucoma, hyperlipidemia, hypertension, insomnia, internal and external bleeding hemorrhoids, iron deficiency anemia, osteoarthritis, Parkinson's disease, rosacea, recently diagnosed with stage III lung cancer (last radiation on 02/08/2021),  Clinical Impression  The patient demonstrates very unsteady gait, requiring support at times. Patient  with posterior balance loss upon standing from bed x 1 and required therapist support.  Patient ambulated x 30' with RW,  HR up to 155 . SPO2 on 4 L > 94% . Patient is unsteady and will require assistance for any mobility for safety.Patient's Bed and bath on second level and will benefit from a bed on first floor, ? Hospital bed if eligible. Will consult with wife about  family support  as patient has a decline in  functional independence .  Pt admitted with above diagnosis.  Pt currently with functional limitations due to the deficits listed below (see PT Problem List). Pt will benefit from skilled PT to increase their independence and safety with mobility to allow discharge to the venue listed below.        Recommendations for follow up therapy are one component of a multi-disciplinary discharge planning process, led by the attending physician.  Recommendations may be updated based on patient status, additional functional criteria and insurance authorization.  Follow Up Recommendations Home health  PT    Assistance Recommended at Discharge Frequent or constant Supervision/Assistance  Functional Status Assessment Patient has had a recent decline in their functional status and demonstrates the ability to make significant improvements in function in a reasonable and predictable amount of time.  Equipment Recommendations  None recommended by PT    Recommendations for Other Services       Precautions / Restrictions Precautions Precautions: Fall Precaution Comments: monitor sats and HR- up to 150's Restrictions Weight Bearing Restrictions: No      Mobility  Bed Mobility Overal bed mobility: Independent;Needs Assistance Bed Mobility: Supine to Sit     Supine to sit: Min guard     General bed mobility comments: cues for safety    Transfers Overall transfer level: Needs assistance Equipment used: Rolling walker (2 wheels) Transfers: Sit to/from Stand Sit to Stand: Min guard;Mod assist           General transfer comment: frequent vcs for safety, on second stand to scoot up along bed, patient reared back and therapist  had to support  patient    Ambulation/Gait Ambulation/Gait assistance: Mod assist Gait Distance (Feet): 30 Feet Assistive device: Rolling walker (2 wheels) Gait Pattern/deviations: Step-through pattern;Staggering left;Staggering right Gait velocity: decr     General Gait Details: noted knees buckling at times, staggers  and requires support to steady, +2 for safety and equipment, easily distracted with conversation  Stairs            Wheelchair Mobility    Modified Rankin (Stroke Patients Only)       Balance Overall balance assessment: History of Falls;Needs assistance Sitting-balance support: Feet supported;No upper extremity supported Sitting balance-Leahy Scale: Good  Standing balance support: Bilateral upper extremity supported;During functional activity;Reliant on assistive device for balance Standing balance-Leahy Scale:  Poor Standing balance comment: poor dynamic at times,  required steady assistance when ambulating, Also posterir thrust when stood up                             Pertinent Vitals/Pain Pain Assessment: Faces Pain Score: 3  Pain Location: generalized Pain Descriptors / Indicators: Discomfort Pain Intervention(s): Monitored during session    Home Living Family/patient expects to be discharged to:: Private residence Living Arrangements: Spouse/significant other Available Help at Discharge: Available 24 hours/day Type of Home: House Home Access: Stairs to enter Entrance Stairs-Rails: None Entrance Stairs-Number of Steps: 3 Alternate Level Stairs-Number of Steps: flight with landing Home Layout: Two level Home Equipment: Advice worker (2 wheels);Wheelchair - manual      Prior Function Prior Level of Function : Needs assist  Cognitive Assist : Mobility (cognitive)     Physical Assist : ADLs (physical)   ADLs (physical): Dressing;IADLs Mobility Comments: safety, tends to  get up when cued not to ADLs Comments: asssit from wife PRN due to [place]     Hand Dominance   Dominant Hand: Right    Extremity/Trunk Assessment   Upper Extremity Assessment Upper Extremity Assessment:  (significant tremors.)    Lower Extremity Assessment Lower Extremity Assessment: Generalized weakness    Cervical / Trunk Assessment Cervical / Trunk Assessment: Kyphotic  Communication   Communication: No difficulties  Cognition Arousal/Alertness: Awake/alert Behavior During Therapy: WFL for tasks assessed/performed;Impulsive Overall Cognitive Status: No family/caregiver present to determine baseline cognitive functioning Area of Impairment: Awareness                         Safety/Judgement: Decreased awareness of safety;Decreased awareness of deficits     General Comments: Pt with fair safety awareness requiring frequent vcs for safe use of RW, repeats" I am  going hhome today after radiation"        General Comments General comments (skin integrity, edema, etc.): HR up to 147 with activity, SPO2 >95% throughout.    Exercises Other Exercises Other Exercises: education re: role of OT, role of rehab, safety with mobility, ADL safety   Assessment/Plan    PT Assessment Patient needs continued PT services  PT Problem List Decreased strength;Decreased mobility;Decreased safety awareness;Decreased knowledge of precautions;Decreased coordination;Decreased activity tolerance;Decreased cognition;Cardiopulmonary status limiting activity;Decreased balance       PT Treatment Interventions DME instruction;Therapeutic activities;Cognitive remediation;Gait training;Therapeutic exercise;Patient/family education;Stair training;Functional mobility training    PT Goals (Current goals can be found in the Care Plan section)  Acute Rehab PT Goals Patient Stated Goal: I am going home tonight. PT Goal Formulation: With patient/family Time For Goal Achievement: 02/27/21 Potential to Achieve Goals: Fair    Frequency Min 3X/week   Barriers to discharge        Co-evaluation PT/OT/SLP Co-Evaluation/Treatment: Yes Reason for Co-Treatment: For patient/therapist safety;To address functional/ADL transfers PT goals addressed during session: Mobility/safety with mobility OT goals addressed during session: ADL's and self-care       AM-PAC PT "6 Clicks" Mobility  Outcome Measure Help needed turning from your back to your side while in a flat bed without using bedrails?: None Help needed moving from lying on your back to sitting on the side of a flat bed without using bedrails?: None Help needed moving to and from a bed to a chair (including  a wheelchair)?: A Lot Help needed standing up from a chair using your arms (e.g., wheelchair or bedside chair)?: A Lot Help needed to walk in hospital room?: A Lot Help needed climbing 3-5 steps with a railing? : Total 6  Click Score: 15    End of Session Equipment Utilized During Treatment: Gait belt;Oxygen Activity Tolerance: Patient limited by fatigue Patient left: in bed;with call bell/phone within reach;with bed alarm set Nurse Communication: Mobility status PT Visit Diagnosis: Unsteadiness on feet (R26.81);Difficulty in walking, not elsewhere classified (R26.2)    Time: 3570-1779 PT Time Calculation (min) (ACUTE ONLY): 37 min   Charges:   PT Evaluation $PT Eval Moderate Complexity: [place] Pager [phone] Office [phone]   Claretha Cooper 02/13/2021, 2:33 PM

## 2021-02-13 NOTE — Assessment & Plan Note (Signed)
-   Continue Cardizem

## 2021-02-13 NOTE — Assessment & Plan Note (Signed)
Continue sertraline 

## 2021-02-13 NOTE — Assessment & Plan Note (Addendum)
-   statin resumed at d/c

## 2021-02-13 NOTE — Assessment & Plan Note (Signed)
Continue Sinemet ?

## 2021-02-13 NOTE — Progress Notes (Signed)
Progress Note  Patient Name: Briton Sellman Date of Encounter: 02/13/2021  [facility] Cardiologist: Minus Breeding, MD   Subjective   No CP or dyspnea  Inpatient Medications    Scheduled Meds:  (feeding supplement) PROSource Plus  30 mL Oral BID BM   vitamin C  500 mg Oral Daily   atorvastatin  20 mg Oral QHS   benzonatate  200 mg Oral TID   buPROPion  150 mg Oral BID   carbidopa-levodopa  1.5 tablet Oral TID   cholecalciferol  1,000 Units Oral Daily   donepezil  10 mg Oral QHS   feeding supplement  237 mL Oral BID BM   ferrous gluconate  324 mg Oral Q breakfast   insulin aspart  0-15 Units Subcutaneous TID WC   Ipratropium-Albuterol  1 puff Inhalation Q6H   methylPREDNISolone (SOLU-MEDROL) injection  40 mg Intravenous Daily   mirtazapine  15 mg Oral QHS   modafinil  100 mg Oral Daily   multivitamin with minerals  1 tablet Oral Daily   pantoprazole  40 mg Oral Daily   potassium chloride SA  20 mEq Oral BID   sertraline  150 mg Oral Daily   zinc sulfate  220 mg Oral Daily   Continuous Infusions:  azithromycin 500 mg (02/12/21 2300)   cefTRIAXone (ROCEPHIN)  IV 2 g (02/12/21 2138)   diltiazem (CARDIZEM) infusion 5 mg/hr (02/13/21 0617)   heparin 1,900 Units/hr (02/13/21 0616)   remdesivir 100 mg in NS 100 mL     PRN Meds: acetaminophen **OR** acetaminophen, HYDROcodone bit-homatropine, nitroGLYCERIN, prochlorperazine, traMADol   Vital Signs    Vitals:   02/12/21 0821 02/12/21 2031 02/13/21 0151 02/13/21 0506  BP:  110/79 127/66 136/76  Pulse:  69 94 (!) 50  Resp:  20 15 15   Temp: 98.5 F (36.9 C) 98.9 F (37.2 C) 98.9 F (37.2 C) 97.9 F (36.6 C)  TempSrc: Oral Oral Oral Oral  SpO2:  95% 95% 98%  Weight:      Height:        Intake/Output Summary (Last 24 hours) at 02/13/2021 0921 Last data filed at 02/13/2021 0149 Gross per 24 hour  Intake 1103.74 ml  Output 300 ml  Net 803.74 ml   Last 3 Weights 02/11/2021 02/11/2021 02/06/2021  Weight  (lbs) 201 lb 3.2 oz 201 lb 3.2 oz 202 lb  Weight (kg) 91.264 kg 91.264 kg 91.627 kg      Telemetry    Atrial flutter - Personally Reviewed  Physical Exam   GEN: No acute distress.   Neck: No JVD Cardiac: irregular Respiratory: Clear to auscultation bilaterally. GI: Soft, nontender, non-distended  MS: No edema Neuro:  Nonfocal  Psych: Normal affect   Labs   Chemistry Recent Labs  Lab 02/11/21 1313 02/12/21 0416 02/13/21 0439  NA 139 141 137  K 3.5 4.1 4.3  CL 102 103 100  CO2 28 30 27   GLUCOSE 108* 102* 164*  BUN 15 13 16   CREATININE 0.87 0.79 0.82  CALCIUM 9.3 9.2 9.3  MG 1.9 2.1 2.1  PROT 7.1 6.4* 6.8  ALBUMIN 3.3* 3.1* 3.1*  AST 10* 11* 12*  ALT 8 <5 7  ALKPHOS 65 62 62  BILITOT 0.6 0.7 0.5  GFRNONAA >60 >60 >60  ANIONGAP 9 8 10      Hematology Recent Labs  Lab 02/11/21 1313 02/12/21 0416 02/13/21 0439  WBC 9.2 8.4 4.8  RBC 4.12* 4.18* 4.32  HGB 7.8* 8.5* 8.7*  HCT 29.5*  31.2* 30.6*  MCV 71.6* 74.6* 70.8*  MCH 18.9* 20.3* 20.1*  MCHC 26.4* 27.2* 28.4*  RDW 17.5* 17.7* 17.9*  PLT 463* 412* 478*   Thyroid  Recent Labs  Lab 02/13/21 0439  TSH 0.224*    BNP Recent Labs  Lab 02/11/21 2225  BNP 288.6*    DDimer  Recent Labs  Lab 02/11/21 2225 02/12/21 0416 02/13/21 0439  DDIMER 2.35* 2.02* 2.36*     Radiology    DG Chest Port 1 View  Result Date: 02/11/2021 CLINICAL DATA:  Pt c/o chest pain, sternum area, constant pain x 1 day that has since improved, SOB, general weakness that has progressively gotten worse. HX: COPD, lung cancer (left side mass) diagnosed December 2022. EXAM: PORTABLE CHEST - 1 VIEW COMPARISON:  10/25/2020 FINDINGS: Interval improved aeration in left upper lung. Persistent dense consolidation/atelectasis in the left lower lung with possible associated effusion. The right lung remains clear. Heart size difficult to assess due to adjacent opacities. Aortic Atherosclerosis (ICD10-170.0). No pneumothorax. Vertebral  endplate spurring at multiple levels in the lower thoracic spine. IMPRESSION: Dense left lower lung consolidation/atelectasis with possible effusion Electronically Signed   By: Lucrezia Europe M.D.   On: 02/11/2021 13:38   ECHOCARDIOGRAM COMPLETE  Result Date: 02/12/2021    ECHOCARDIOGRAM REPORT   Patient Name:   SUMEET GETER Date of Exam: 02/12/2021 Medical Rec #:  [id number]         Height:       73.0 in Accession #:    [id number]        Weight:       201.2 lb Date of Birth:  Jun 08, 1945          BSA:          2.157 m Patient Age:    76 years          BP:           139/61 mmHg Patient Gender: M                 HR:           108 bpm. Exam Location:  Inpatient Procedure: 2D Echo, Cardiac Doppler and Color Doppler Indications:    Aflutter  History:        Patient has prior history of Echocardiogram examinations, most                 recent 06/06/2020. COPD, Arrythmias:Tachycardia,                 Signs/Symptoms:Chest Pain; Risk Factors:Hypertension and                 Diabetes.  Sonographer:    Glo Herring Referring Phys: [id number] [place]  1. Left ventricular ejection fraction, by estimation, is 55 to 60%. The left ventricle has normal function. The left ventricle has no regional wall motion abnormalities. There is mild concentric left ventricular hypertrophy. Left ventricular diastolic function could not be evaluated.  2. Right ventricular systolic function is normal. The right ventricular size is normal. There is normal pulmonary artery systolic pressure.  3. Left atrial size was mild to moderately dilated.  4. Right atrial size was mild to moderately dilated.  5. A small pericardial effusion is present. There is no evidence of cardiac tamponade.  6. The mitral valve is normal in structure. No evidence of mitral valve regurgitation. No evidence of mitral stenosis.  7. The aortic valve is grossly normal. Aortic valve  regurgitation is not visualized. No aortic stenosis is present.  8. There  is mild dilatation of the ascending aorta, measuring 40 mm.  9. The inferior vena cava is dilated in size with >50% respiratory variability, suggesting right atrial pressure of 8 mmHg. Comparison(s): Changes from prior study are noted. Now in atrial flutter. Conclusion(s)/Recommendation(s): Otherwise normal echocardiogram, with minor abnormalities described in the report. In atrial flutter throughout the study. FINDINGS  Left Ventricle: Left ventricular ejection fraction, by estimation, is 55 to 60%. The left ventricle has normal function. The left ventricle has no regional wall motion abnormalities. The left ventricular internal cavity size was normal in size. There is  mild concentric left ventricular hypertrophy. Left ventricular diastolic function could not be evaluated due to nondiagnostic images. Left ventricular diastolic function could not be evaluated. Right Ventricle: The right ventricular size is normal. No increase in right ventricular wall thickness. Right ventricular systolic function is normal. There is normal pulmonary artery systolic pressure. The tricuspid regurgitant velocity is 2.45 m/s, and  with an assumed right atrial pressure of 8 mmHg, the estimated right ventricular systolic pressure is 62.1 mmHg. Left Atrium: Left atrial size was mild to moderately dilated. Right Atrium: Right atrial size was mild to moderately dilated. Pericardium: A small pericardial effusion is present. There is no evidence of cardiac tamponade. Mitral Valve: The mitral valve is normal in structure. No evidence of mitral valve regurgitation. No evidence of mitral valve stenosis. Tricuspid Valve: The tricuspid valve is normal in structure. Tricuspid valve regurgitation is trivial. No evidence of tricuspid stenosis. Aortic Valve: The aortic valve is grossly normal. Aortic valve regurgitation is not visualized. No aortic stenosis is present. Aortic valve mean gradient measures 4.0 mmHg. Aortic valve peak gradient measures  8.0 mmHg. Aortic valve area, by VTI measures 2.87 cm. Pulmonic Valve: The pulmonic valve was not well visualized. Pulmonic valve regurgitation is not visualized. Aorta: The aortic root, ascending aorta, aortic arch and descending aorta are all structurally normal, with no evidence of dilitation or obstruction. There is mild dilatation of the ascending aorta, measuring 40 mm. Venous: The inferior vena cava is dilated in size with greater than 50% respiratory variability, suggesting right atrial pressure of 8 mmHg. IAS/Shunts: The atrial septum is grossly normal.  LEFT VENTRICLE PLAX 2D LVIDd:         4.70 cm      Diastology LVIDs:         3.20 cm      LV e' medial:    8.49 cm/s LV PW:         1.20 cm      LV E/e' medial:  9.8 LV IVS:        1.20 cm      LV e' lateral:   12.13 cm/s LVOT diam:     2.00 cm      LV E/e' lateral: 6.9 LV SV:         72 LV SV Index:   33 LVOT Area:     3.14 cm  LV Volumes (MOD) LV vol d, MOD A2C: 105.0 ml LV vol d, MOD A4C: 93.3 ml LV vol s, MOD A2C: 46.5 ml LV vol s, MOD A4C: 42.4 ml LV SV MOD A2C:     58.5 ml LV SV MOD A4C:     93.3 ml LV SV MOD BP:      56.7 ml RIGHT VENTRICLE             IVC RV Basal  diam:  4.30 cm     IVC diam: 2.50 cm RV S prime:     14.60 cm/s LEFT ATRIUM             Index        RIGHT ATRIUM           Index LA diam:        4.60 cm 2.13 cm/m   RA Area:     28.00 cm LA Vol (A2C):   86.3 ml 40.01 ml/m  RA Volume:   93.50 ml  43.34 ml/m LA Vol (A4C):   56.7 ml 26.29 ml/m LA Biplane Vol: 70.6 ml 32.73 ml/m  AORTIC VALVE                    PULMONIC VALVE AV Area (Vmax):    2.87 cm     PV Vmax:       0.99 m/s AV Area (Vmean):   2.69 cm     PV Peak grad:  3.9 mmHg AV Area (VTI):     2.87 cm AV Vmax:           141.67 cm/s AV Vmean:          94.533 cm/s AV VTI:            0.249 m AV Peak Grad:      8.0 mmHg AV Mean Grad:      4.0 mmHg LVOT Vmax:         129.33 cm/s LVOT Vmean:        80.867 cm/s LVOT VTI:          0.228 m LVOT/AV VTI ratio: 0.91  AORTA Ao Root  diam: 4.00 cm Ao Asc diam:  3.40 cm MITRAL VALVE               TRICUSPID VALVE MV Area (PHT): 5.08 cm    TR Peak grad:   24.0 mmHg MV Decel Time: 149 msec    TR Vmax:        245.00 cm/s MV E velocity: 83.13 cm/s                            SHUNTS                            Systemic VTI:  0.23 m                            Systemic Diam: 2.00 cm Buford Dresser MD Electronically signed by Buford Dresser MD Signature Date/Time: 02/12/2021/11:39:35 AM    Final      Patient Profile     75 year old male with past medical history of stage III lung cancer status post resection, Parkinson's, COPD, diabetes mellitus, hypertension, hyperlipidemia for evaluation of atrial flutter.  Patient presented for his first round of chemotherapy for his lung cancer on December 19 and was found to be tachycardic.  He was found to be in atrial flutter and also COVID-positive.  Cardiology asked to evaluate.    Assessment & Plan    1 atrial flutter-patient remains in atrial flutter this morning.  He remains asymptomatic and heart rate has improved.  Discontinue IV Cardizem and begin Cardizem CD 180 mg daily.  Discontinue IV heparin and treat with apixaban 5 mg twice daily.  Patient can follow-up in atrial fibrillation clinic  2 to 4 weeks following discharge.  Can consider cardioversion after 3 weeks of therapeutic anticoagulation if atrial flutter persists though may be difficult to maintain sinus rhythm in the setting of his lung cancer.  Would not proceed with cardioversion in the setting of acute COVID infection.   2 hypertension-blood Pressure controlled.  We will continue off of hydrochlorothiazide.   3 lung cancer-Per oncology.   [place] per primary care.  Patient could be discharged from a cardiac standpoint with Cardizem CD 180 mg daily, apixaban 5 mg twice daily and off of HCTZ.  We will arrange follow-up in atrial fibrillation clinic 2 to 4 weeks following discharge.  For questions  or updates, please contact [place] Please consult [email or web address] for contact info under        Signed, Kirk Ruths, MD  02/13/2021, 9:21 AM

## 2021-02-13 NOTE — Assessment & Plan Note (Addendum)
-   Iron stores low. Infusion deferred inpatient in setting of infection

## 2021-02-14 ENCOUNTER — Inpatient Hospital Stay (HOSPITAL_COMMUNITY): Payer: Medicare Other

## 2021-02-14 ENCOUNTER — Ambulatory Visit
Admission: RE | Admit: 2021-02-14 | Discharge: 2021-02-14 | Disposition: A | Discharge status: Home / Self Care | Payer: Medicare Other | Source: Ambulatory Visit | Attending: Radiation Oncology | Admitting: Radiation Oncology

## 2021-02-14 ENCOUNTER — Ambulatory Visit: Payer: Medicare Other | Admitting: Cardiology

## 2021-02-14 DIAGNOSIS — R7989 Other specified abnormal findings of blood chemistry: Secondary | ICD-10-CM

## 2021-02-14 LAB — GLUCOSE, CAPILLARY
Glucose-Capillary: 146 mg/dL — ABNORMAL HIGH (ref 70–99)
Glucose-Capillary: 149 mg/dL — ABNORMAL HIGH (ref 70–99)
Glucose-Capillary: 185 mg/dL — ABNORMAL HIGH (ref 70–99)
Glucose-Capillary: 286 mg/dL — ABNORMAL HIGH (ref 70–99)

## 2021-02-14 LAB — COMPREHENSIVE METABOLIC PANEL
ALT: 7 U/L (ref 0–44)
AST: 11 U/L — ABNORMAL LOW (ref 15–41)
Albumin: 3.1 g/dL — ABNORMAL LOW (ref 3.5–5.0)
Alkaline Phosphatase: 59 U/L (ref 38–126)
Anion gap: 9 (ref 5–15)
BUN: 21 mg/dL (ref 8–23)
CO2: 28 mmol/L (ref 22–32)
Calcium: 9.5 mg/dL (ref 8.9–10.3)
Chloride: 103 mmol/L (ref 98–111)
Creatinine, Ser: 0.8 mg/dL (ref 0.61–1.24)
GFR, Estimated: >60 mL/min (ref >60)
Glucose, Bld: 161 mg/dL — ABNORMAL HIGH (ref 70–99)
Potassium: 4.4 mmol/L (ref 3.5–5.1)
Sodium: 140 mmol/L (ref 135–145)
Total Bilirubin: 0.4 mg/dL (ref 0.3–1.2)
Total Protein: 6.8 g/dL (ref 6.5–8.1)

## 2021-02-14 LAB — CBC WITH DIFFERENTIAL/PLATELET
Abs Immature Granulocytes: 0.07 10*3/uL (ref 0.00–0.07)
Basophils Absolute: 0 10*3/uL (ref 0.0–0.1)
Basophils Relative: 0 %
Eosinophils Absolute: 0 10*3/uL (ref 0.0–0.5)
Eosinophils Relative: 0 %
HCT: 32.5 % — ABNORMAL LOW (ref 39.0–52.0)
Hemoglobin: 9.1 g/dL — ABNORMAL LOW (ref 13.0–17.0)
Immature Granulocytes: 1 %
Lymphocytes Relative: 4 %
Lymphs Abs: 0.5 10*3/uL — ABNORMAL LOW (ref 0.7–4.0)
MCH: 20 pg — ABNORMAL LOW (ref 26.0–34.0)
MCHC: 28 g/dL — ABNORMAL LOW (ref 30.0–36.0)
MCV: 71.3 fL — ABNORMAL LOW (ref 80.0–100.0)
Monocytes Absolute: 1 10*3/uL (ref 0.1–1.0)
Monocytes Relative: 9 %
Neutro Abs: 9.9 10*3/uL — ABNORMAL HIGH (ref 1.7–7.7)
Neutrophils Relative %: 86 %
Platelets: 540 10*3/uL — ABNORMAL HIGH (ref 150–400)
RBC: 4.56 MIL/uL (ref 4.22–5.81)
RDW: 18.4 % — ABNORMAL HIGH (ref 11.5–15.5)
WBC: 11.4 10*3/uL — ABNORMAL HIGH (ref 4.0–10.5)
nRBC: 0 % (ref 0.0–0.2)

## 2021-02-14 LAB — FERRITIN: Ferritin: 74 ng/mL (ref 24–336)

## 2021-02-14 LAB — C-REACTIVE PROTEIN: CRP: 4.3 mg/dL — ABNORMAL HIGH (ref <1.0)

## 2021-02-14 LAB — D-DIMER, QUANTITATIVE: D-Dimer, Quant: 2.72 ug/mL-FEU — ABNORMAL HIGH (ref 0.00–0.50)

## 2021-02-14 LAB — PHOSPHORUS: Phosphorus: 2.9 mg/dL (ref 2.5–4.6)

## 2021-02-14 LAB — MAGNESIUM: Magnesium: 2.2 mg/dL (ref 1.7–2.4)

## 2021-02-14 LAB — LACTATE DEHYDROGENASE: LDH: 89 U/L — ABNORMAL LOW (ref 98–192)

## 2021-02-14 LAB — T3: T3, Total: 84 ng/dL (ref 71–180)

## 2021-02-14 IMAGING — US US THYROID
1 series · 14 of 25 positions shown · non-contrast
Comparison: [DATE] and previous

CLINICAL DATA: Abnormal TSH, COVID

EXAM:
THYROID ULTRASOUND
TECHNIQUE: Ultrasound examination of the thyroid gland and adjacent soft
tissues was performed.

[Series 1: us thyroid mc & wl · 14 of 53 slices shown]
[im 1/53]
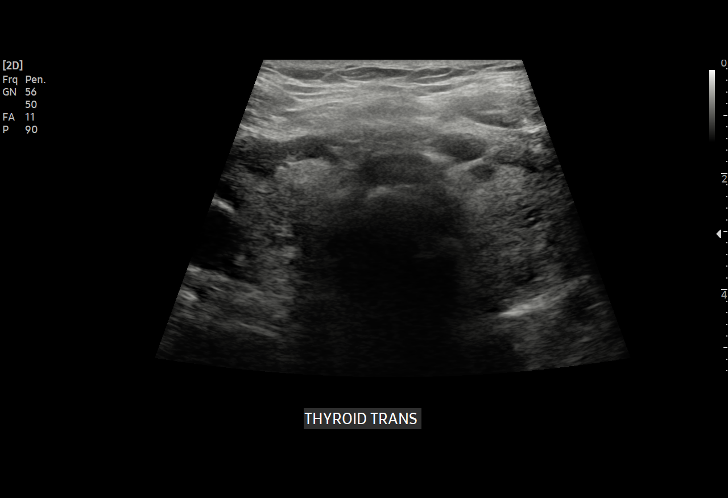
[im 5/53]
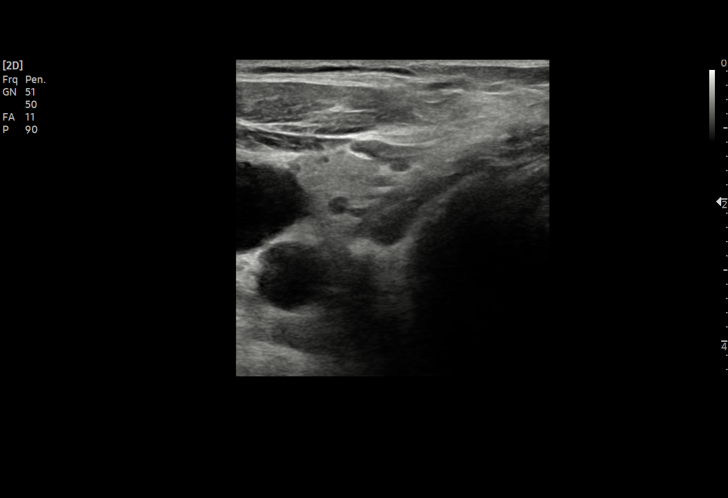
[im 9/53]
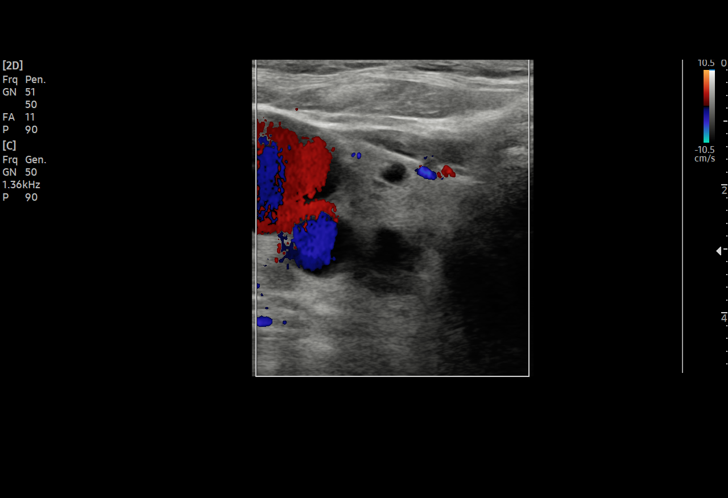
[im 14/53]
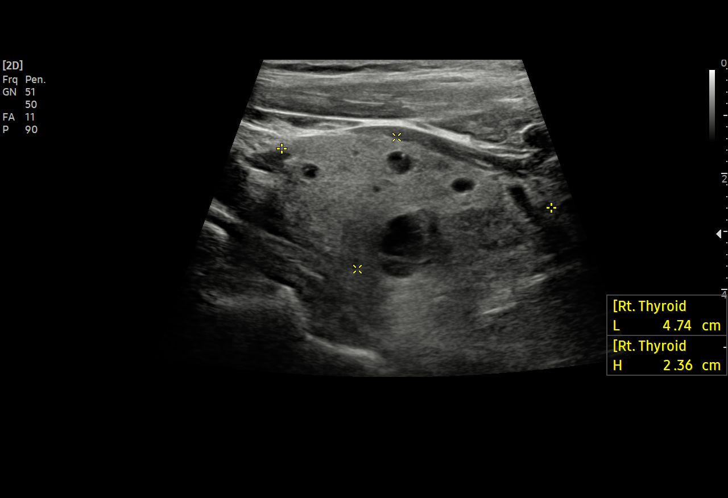
[im 18/53]
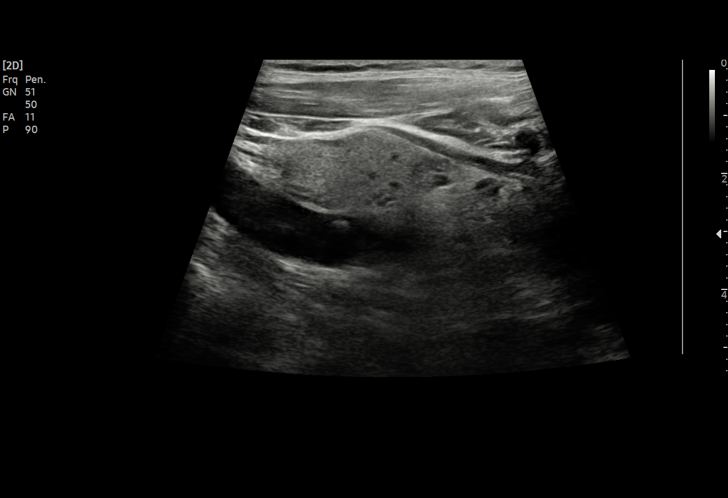
[im 20/53]
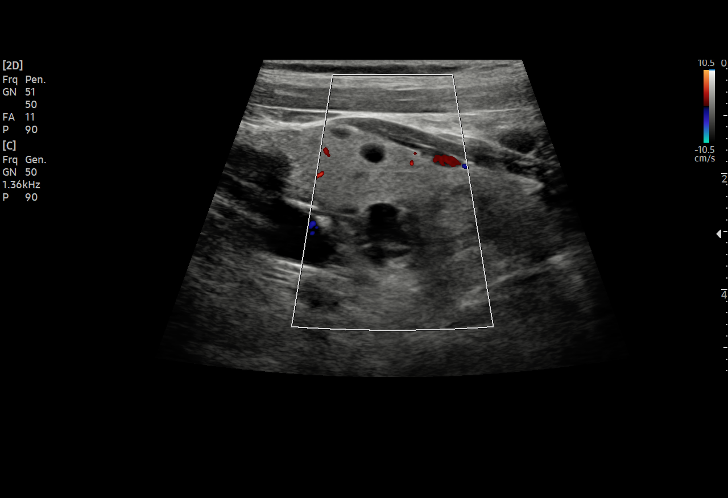
[im 24/53]
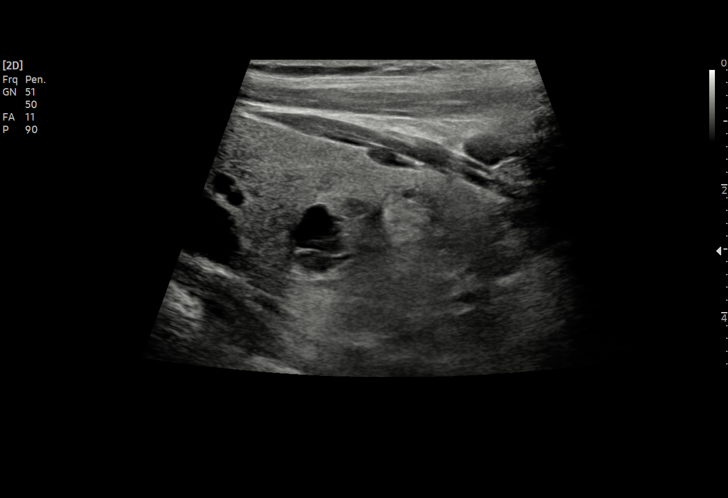
[im 29/53]
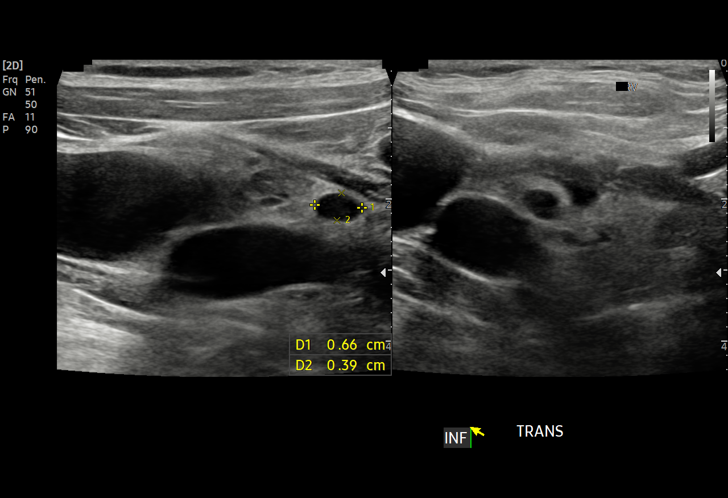
[im 33/53]
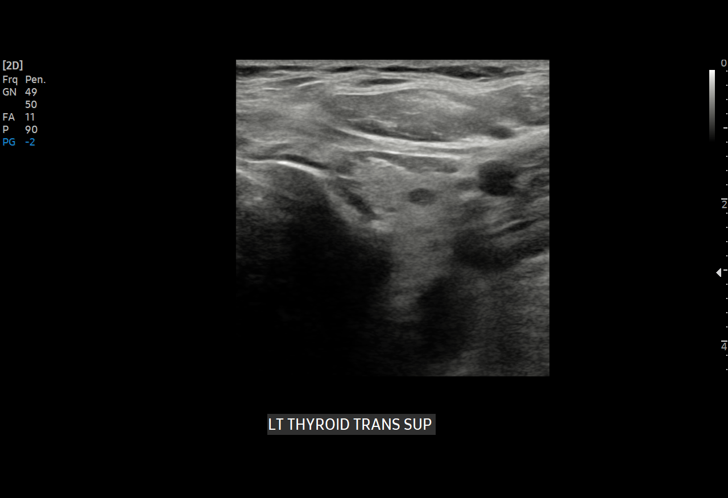
[im 35/53]
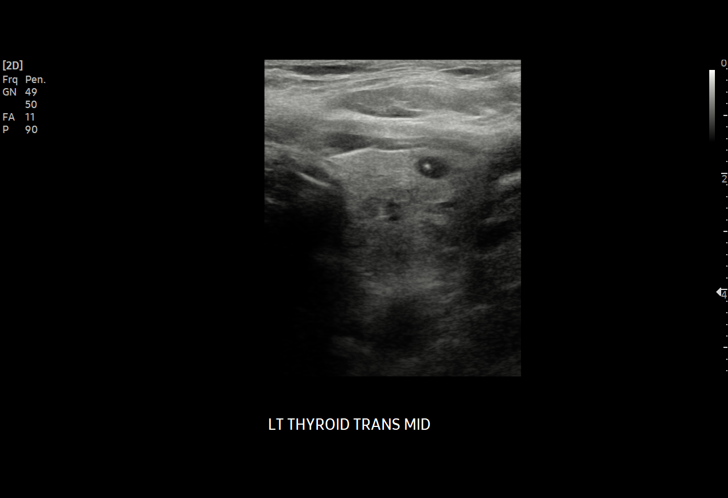
[im 40/53]
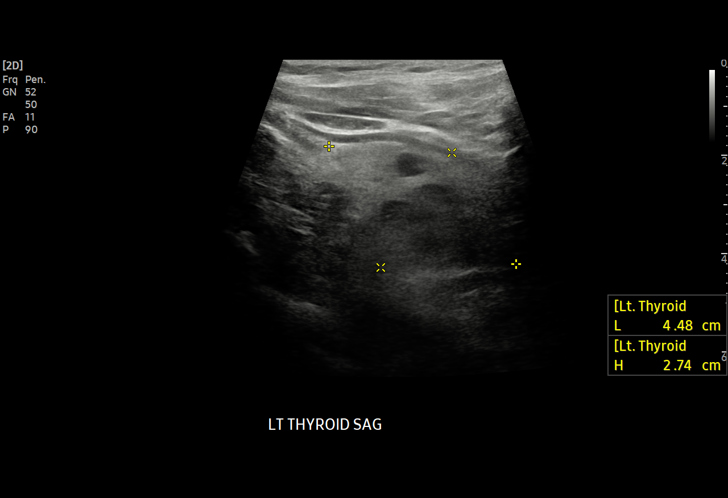
[im 44/53]
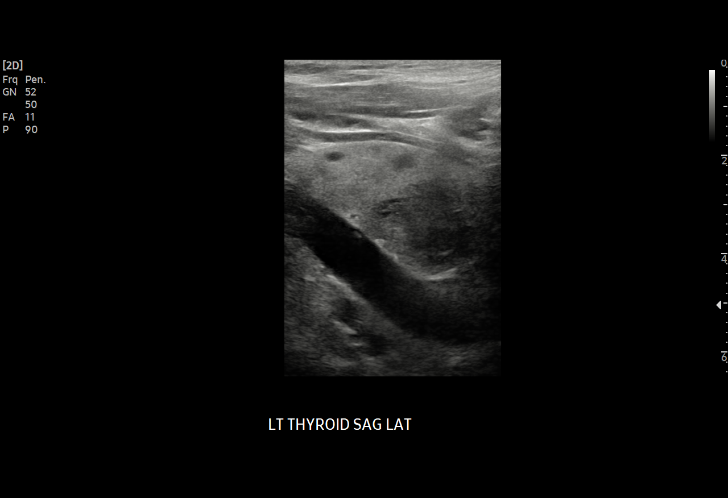
[im 48/53]
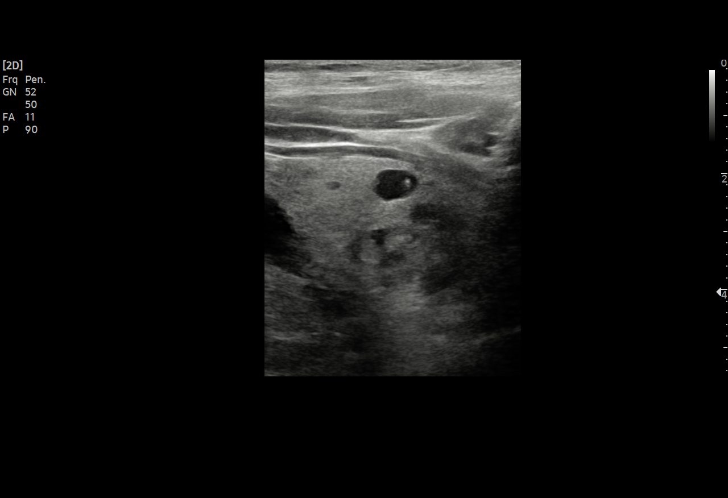
[im 53/53]
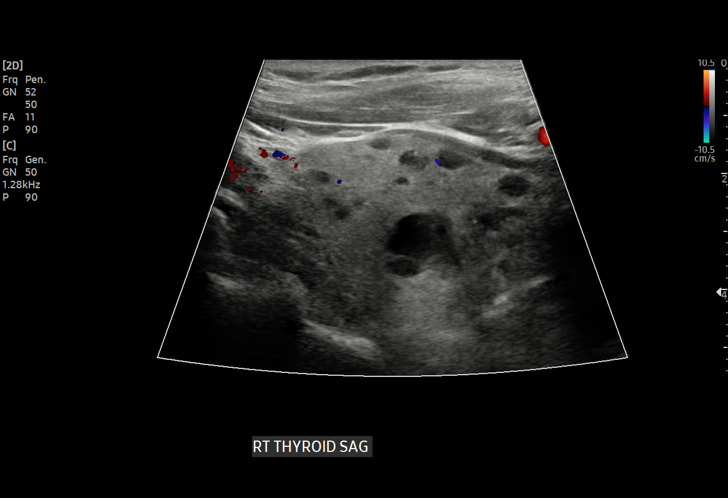

[14 of 25 positions shown; findings below may reference images not displayed]

FINDINGS: Parenchymal Echotexture: Moderately heterogenous

Isthmus: 0.2 cm thickness, previously

Right lobe: 4.7 x 2.4 x 1.9 cm, previously 5.6 x 2.7 x

Left lobe: 4.5 x 2.7 x 2.5 cm, previously 5.1 x 2.1 x

_________________________________________________________

Estimated total number of nodules >/= 1 cm: 3

Number of spongiform nodules >/=  2 cm not described below (TR1): 0

Number of mixed cystic and solid nodules >/= 1.5 cm not described
below (TR2): 0

_________________________________________________________

Nodule 1: 1.5 cm complex cyst, mid right. This was previously
measured as 3 separate lesions on previous study of [DATE] but
in retrospect is unchanged. Stability for greater than 5 years
implies benignity. This nodule does NOT meet TI-RADS criteria for
biopsy or dedicated follow-up.

Nodule 2: 0.7 cm benign cyst, inferolateral right.

Nodule 3: 1 cm hyperechoic nodule, stable since previous. Stability
for greater than 5 years implies benignity. This nodule does NOT
meet TI-RADS criteria for biopsy or dedicated follow-up.

Nodule # 4:

Prior biopsy: No

Location: Left; inferior

Maximum size: 2.1 cm; Other 2 dimensions: 1.7 x 1.8 cm, previously,
1.1 x 0.9 x 0.9 cm

Composition: solid/almost completely solid (2)

Echogenicity: hypoechoic (2)

Shape: not taller-than-wide (0)

Margins: ill-defined (0)

Echogenic foci: none (0)

ACR TI-RADS total points: 4.

ACR TI-RADS risk category:  TR 4.

Significant change in size (>/= 20% in two dimensions and minimal
increase of 2 mm): Yes

Change in features: No

Change in ACR TI-RADS risk category: No

ACR TI-RADS recommendations:

**Given size (>/= 1.5 cm) and appearance, fine needle aspiration of
this moderately suspicious nodule should be considered based on
TI-RADS criteria.

Nodule 5: 0.7 cm complex cyst, mid left; This nodule does NOT meet
TI-RADS criteria for biopsy or dedicated follow-up.

_________________________________________________________

No regional cervical adenopathy identified.
IMPRESSION: 1. Borderline thyromegaly with bilateral nodules.
2. Recommend FNA biopsy of enlarging 2.1 cm moderately suspicious
inferior left nodule.

The above is in keeping with the ACR TI-RADS recommendations - [HOSPITAL] [1T];[DATE].

## 2021-02-14 MED ORDER — IPRATROPIUM-ALBUTEROL 20-100 MCG/ACT IN AERS: 1.0000 | INHALATION_SPRAY | Freq: Four times a day (QID) | RESPIRATORY_TRACT | Status: DC | PRN

## 2021-02-14 MED ORDER — IPRATROPIUM-ALBUTEROL 20-100 MCG/ACT IN AERS
1.0000 | INHALATION_SPRAY | Freq: Two times a day (BID) | RESPIRATORY_TRACT | Status: DC
  Administered 2021-02-15 – 2021-02-16 (×3): 1 via RESPIRATORY_TRACT
  Filled 2021-02-14: qty 4

## 2021-02-14 MED ORDER — AZITHROMYCIN 250 MG PO TABS
500.0000 mg | ORAL_TABLET | Freq: Every day | ORAL | Status: AC
  Administered 2021-02-14 – 2021-02-15 (×2): 500 mg via ORAL
  Filled 2021-02-14 (×2): qty 2

## 2021-02-14 MED ORDER — DILTIAZEM HCL ER COATED BEADS 180 MG PO CP24
300.0000 mg | ORAL_CAPSULE | Freq: Every day | ORAL | Status: DC
  Administered 2021-02-14 – 2021-02-16 (×3): 300 mg via ORAL
  Filled 2021-02-14 (×3): qty 1

## 2021-02-14 MED ORDER — DILTIAZEM HCL-DEXTROSE 125-5 MG/125ML-% IV SOLN (PREMIX)
5.0000 mg/h | INTRAVENOUS | Status: DC
  Administered 2021-02-14: 02:00:00 5 mg/h via INTRAVENOUS
  Filled 2021-02-14: qty 125

## 2021-02-14 NOTE — Plan of Care (Signed)
Cardiazem gtt DC'ed at 1109. Pt maintained HR 70-90. Pt remains on baseline of 4L O2. Pt satisfied with pain regimen.   Problem: Education: Goal: Knowledge of General Education information will improve Description: Including pain rating scale, medication(s)/side effects and non-pharmacologic comfort measures Outcome: Progressing   Problem: Health Behavior/Discharge Planning: Goal: Ability to manage health-related needs will improve Outcome: Progressing   Problem: Clinical Measurements: Goal: Ability to maintain clinical measurements within normal limits will improve Outcome: Progressing Goal: Will remain free from infection Outcome: Progressing Goal: Diagnostic test results will improve Outcome: Progressing Goal: Cardiovascular complication will be avoided Outcome: Progressing   Problem: Activity: Goal: Risk for activity intolerance will decrease Outcome: Progressing   Problem: Pain Managment: Goal: General experience of comfort will improve Outcome: Progressing   Problem: Safety: Goal: Ability to remain free from injury will improve Outcome: Progressing   Problem: Skin Integrity: Goal: Risk for impaired skin integrity will decrease Outcome: Progressing   Problem: Clinical Measurements: Goal: Respiratory complications will improve Outcome: Completed/Met   Problem: Coping: Goal: Level of anxiety will decrease Outcome: Completed/Met

## 2021-02-14 NOTE — Assessment & Plan Note (Addendum)
-   TSH suppressed on admission, 0.224 - No known history of thyroid dysfunction - Free T4 also elevated, 1.31.  Total T3 noted to be in normal range but not as pertinent given fast systemic metabolism -Ultrasound shows multiple nodules bilaterally.  Concerning nodule is the left inferior nodule measuring 2.1 cm which has increased in size from 1.1 cm - FNA recommended per TI-RADS criteria of thyroid nodule; will need further evaluation of this at follow up and will forward d/c summary to PCP as well

## 2021-02-14 NOTE — Progress Notes (Signed)
Progress Note  Patient Name: James [name] Date of Encounter: 02/14/2021  [facility] Cardiologist: Minus Breeding, MD   Subjective   Pt denies CP or dyspnea  Inpatient Medications    Scheduled Meds:  (feeding supplement) PROSource Plus  30 mL Oral BID BM   apixaban  5 mg Oral BID   vitamin C  500 mg Oral Daily   atorvastatin  20 mg Oral QHS   azithromycin  500 mg Oral Daily   benzonatate  200 mg Oral TID   buPROPion  150 mg Oral BID   carbidopa-levodopa  1.5 tablet Oral TID   cholecalciferol  1,000 Units Oral Daily   dexamethasone  6 mg Oral Daily   diltiazem  180 mg Oral Daily   donepezil  10 mg Oral QHS   feeding supplement  237 mL Oral BID BM   ferrous gluconate  324 mg Oral Q breakfast   insulin aspart  0-15 Units Subcutaneous TID WC   Ipratropium-Albuterol  1 puff Inhalation Q6H   mirtazapine  15 mg Oral QHS   modafinil  100 mg Oral Daily   multivitamin with minerals  1 tablet Oral Daily   pantoprazole  40 mg Oral Daily   potassium chloride SA  20 mEq Oral BID   sertraline  150 mg Oral Daily   zinc sulfate  220 mg Oral Daily   Continuous Infusions:  cefTRIAXone (ROCEPHIN)  IV 2 g (02/13/21 2239)   diltiazem (CARDIZEM) infusion 5 mg/hr (02/14/21 0158)   remdesivir 100 mg in NS 100 mL 100 mg (02/13/21 1145)   PRN Meds: acetaminophen **OR** acetaminophen, HYDROcodone bit-homatropine, nitroGLYCERIN, prochlorperazine, traMADol   Vital Signs    Vitals:   02/13/21 1956 02/13/21 2242 02/14/21 0124 02/14/21 0424  BP: 124/83 130/86 (!) 143/101 112/87  Pulse: (!) 150 (!) 130 (!) 149   Resp: 20 18 18 18   Temp: 98 F (36.7 C) 98.6 F (37 C) 98.3 F (36.8 C) 97.8 F (36.6 C)  TempSrc: Oral Oral Oral Oral  SpO2: 97% 98% 97% 97%  Weight:      Height:        Intake/Output Summary (Last 24 hours) at 02/14/2021 0911 Last data filed at 02/14/2021 0454 Gross per 24 hour  Intake 240 ml  Output 1500 ml  Net -1260 ml    Last 3 Weights 02/11/2021  02/11/2021 02/06/2021  Weight (lbs) 201 lb 3.2 oz 201 lb 3.2 oz 202 lb  Weight (kg) 91.264 kg 91.264 kg 91.627 kg      Telemetry    Atrial flutter HR controlled this AM but elevated yesterday- Personally Reviewed  Physical Exam   GEN: NAD Neck: supple Cardiac: irregular, no murmur Respiratory: CTA GI: Soft, NT/ND MS: No edema Neuro: Grossly intact Psych: Normal affect   Labs   Chemistry Recent Labs  Lab 02/12/21 0416 02/13/21 0439 02/14/21 0404  NA 141 137 140  K 4.1 4.3 4.4  CL 103 100 103  CO2 30 27 28   GLUCOSE 102* 164* 161*  BUN 13 16 21   CREATININE 0.79 0.82 0.80  CALCIUM 9.2 9.3 9.5  MG 2.1 2.1 2.2  PROT 6.4* 6.8 6.8  ALBUMIN 3.1* 3.1* 3.1*  AST 11* 12* 11*  ALT 5 7 7   ALKPHOS 62 62 59  BILITOT 0.7 0.5 0.4  GFRNONAA >60 >60 >60  ANIONGAP 8 10 9       Hematology Recent Labs  Lab 02/12/21 0416 02/13/21 0439 02/14/21 0404  WBC 8.4 4.8 11.4*  RBC 4.18* 4.32 4.56  HGB 8.5* 8.7* 9.1*  HCT 31.2* 30.6* 32.5*  MCV 74.6* 70.8* 71.3*  MCH 20.3* 20.1* 20.0*  MCHC 27.2* 28.4* 28.0*  RDW 17.7* 17.9* 18.4*  PLT 412* 478* 540*    Thyroid  Recent Labs  Lab 02/13/21 0439 02/13/21 1015  TSH 0.224*  --   FREET4  --  1.31*     BNP Recent Labs  Lab 02/11/21 2225  BNP 288.6*     DDimer  Recent Labs  Lab 02/12/21 0416 02/13/21 0439 02/14/21 0404  DDIMER 2.02* 2.36* 2.72*      Radiology    ECHOCARDIOGRAM COMPLETE  Result Date: 02/12/2021    ECHOCARDIOGRAM REPORT   Patient Name:   James [name] Date of Exam: 02/12/2021 Medical Rec #:  [id number]         Height:       73.0 in Accession #:    [id number]        Weight:       201.2 lb Date of Birth:  1946-02-05          BSA:          2.157 m Patient Age:    52 years          BP:           139/61 mmHg Patient Gender: M                 HR:           108 bpm. Exam Location:  Inpatient Procedure: 2D Echo, Cardiac Doppler and Color Doppler Indications:    Aflutter  History:        Patient has prior  history of Echocardiogram examinations, most                 recent 06/06/2020. COPD, Arrythmias:Tachycardia,                 Signs/Symptoms:Chest Pain; Risk Factors:Hypertension and                 Diabetes.  Sonographer:    Glo Herring Referring Phys: [id number] [place]  1. Left ventricular ejection fraction, by estimation, is 55 to 60%. The left ventricle has normal function. The left ventricle has no regional wall motion abnormalities. There is mild concentric left ventricular hypertrophy. Left ventricular diastolic function could not be evaluated.  2. Right ventricular systolic function is normal. The right ventricular size is normal. There is normal pulmonary artery systolic pressure.  3. Left atrial size was mild to moderately dilated.  4. Right atrial size was mild to moderately dilated.  5. A small pericardial effusion is present. There is no evidence of cardiac tamponade.  6. The mitral valve is normal in structure. No evidence of mitral valve regurgitation. No evidence of mitral stenosis.  7. The aortic valve is grossly normal. Aortic valve regurgitation is not visualized. No aortic stenosis is present.  8. There is mild dilatation of the ascending aorta, measuring 40 mm.  9. The inferior vena cava is dilated in size with >50% respiratory variability, suggesting right atrial pressure of 8 mmHg. Comparison(s): Changes from prior study are noted. Now in atrial flutter. Conclusion(s)/Recommendation(s): Otherwise normal echocardiogram, with minor abnormalities described in the report. In atrial flutter throughout the study. FINDINGS  Left Ventricle: Left ventricular ejection fraction, by estimation, is 55 to 60%. The left ventricle has normal function. The left ventricle has no regional wall motion abnormalities. The left  ventricular internal cavity size was normal in size. There is  mild concentric left ventricular hypertrophy. Left ventricular diastolic function could not be evaluated  due to nondiagnostic images. Left ventricular diastolic function could not be evaluated. Right Ventricle: The right ventricular size is normal. No increase in right ventricular wall thickness. Right ventricular systolic function is normal. There is normal pulmonary artery systolic pressure. The tricuspid regurgitant velocity is 2.45 m/s, and  with an assumed right atrial pressure of 8 mmHg, the estimated right ventricular systolic pressure is 53.9 mmHg. Left Atrium: Left atrial size was mild to moderately dilated. Right Atrium: Right atrial size was mild to moderately dilated. Pericardium: A small pericardial effusion is present. There is no evidence of cardiac tamponade. Mitral Valve: The mitral valve is normal in structure. No evidence of mitral valve regurgitation. No evidence of mitral valve stenosis. Tricuspid Valve: The tricuspid valve is normal in structure. Tricuspid valve regurgitation is trivial. No evidence of tricuspid stenosis. Aortic Valve: The aortic valve is grossly normal. Aortic valve regurgitation is not visualized. No aortic stenosis is present. Aortic valve mean gradient measures 4.0 mmHg. Aortic valve peak gradient measures 8.0 mmHg. Aortic valve area, by VTI measures 2.87 cm. Pulmonic Valve: The pulmonic valve was not well visualized. Pulmonic valve regurgitation is not visualized. Aorta: The aortic root, ascending aorta, aortic arch and descending aorta are all structurally normal, with no evidence of dilitation or obstruction. There is mild dilatation of the ascending aorta, measuring 40 mm. Venous: The inferior vena cava is dilated in size with greater than 50% respiratory variability, suggesting right atrial pressure of 8 mmHg. IAS/Shunts: The atrial septum is grossly normal.  LEFT VENTRICLE PLAX 2D LVIDd:         4.70 cm      Diastology LVIDs:         3.20 cm      LV e' medial:    8.49 cm/s LV PW:         1.20 cm      LV E/e' medial:  9.8 LV IVS:        1.20 cm      LV e' lateral:    12.13 cm/s LVOT diam:     2.00 cm      LV E/e' lateral: 6.9 LV SV:         72 LV SV Index:   33 LVOT Area:     3.14 cm  LV Volumes (MOD) LV vol d, MOD A2C: 105.0 ml LV vol d, MOD A4C: 93.3 ml LV vol s, MOD A2C: 46.5 ml LV vol s, MOD A4C: 42.4 ml LV SV MOD A2C:     58.5 ml LV SV MOD A4C:     93.3 ml LV SV MOD BP:      56.7 ml RIGHT VENTRICLE             IVC RV Basal diam:  4.30 cm     IVC diam: 2.50 cm RV S prime:     14.60 cm/s LEFT ATRIUM             Index        RIGHT ATRIUM           Index LA diam:        4.60 cm 2.13 cm/m   RA Area:     28.00 cm LA Vol (A2C):   86.3 ml 40.01 ml/m  RA Volume:   93.50 ml  43.34 ml/m LA Vol (A4C):  56.7 ml 26.29 ml/m LA Biplane Vol: 70.6 ml 32.73 ml/m  AORTIC VALVE                    PULMONIC VALVE AV Area (Vmax):    2.87 cm     PV Vmax:       0.99 m/s AV Area (Vmean):   2.69 cm     PV Peak grad:  3.9 mmHg AV Area (VTI):     2.87 cm AV Vmax:           141.67 cm/s AV Vmean:          94.533 cm/s AV VTI:            0.249 m AV Peak Grad:      8.0 mmHg AV Mean Grad:      4.0 mmHg LVOT Vmax:         129.33 cm/s LVOT Vmean:        80.867 cm/s LVOT VTI:          0.228 m LVOT/AV VTI ratio: 0.91  AORTA Ao Root diam: 4.00 cm Ao Asc diam:  3.40 cm MITRAL VALVE               TRICUSPID VALVE MV Area (PHT): 5.08 cm    TR Peak grad:   24.0 mmHg MV Decel Time: 149 msec    TR Vmax:        245.00 cm/s MV E velocity: 83.13 cm/s                            SHUNTS                            Systemic VTI:  0.23 m                            Systemic Diam: 2.00 cm Buford Dresser MD Electronically signed by Buford Dresser MD Signature Date/Time: 02/12/2021/11:39:35 AM    Final      Patient Profile     75 year old male with past medical history of stage III lung cancer status post resection, Parkinson's, COPD, diabetes mellitus, hypertension, hyperlipidemia for evaluation of atrial flutter.  Patient presented for his first round of chemotherapy for his lung cancer on December 19  and was found to be tachycardic.  He was found to be in atrial flutter and also COVID-positive.  Cardiology asked to evaluate.    Assessment & Plan    1 atrial flutter-patient remains in atrial flutter this morning.  Heart rate increased and IV Cardizem initiated yesterday.  We will discontinue IV Cardizem and treat with CD 300 mg daily.  Continue apixaban.  Patient can follow-up in atrial fibrillation clinic 2 to 4 weeks following discharge.  Can consider cardioversion after 3 weeks of therapeutic anticoagulation if atrial flutter persists though may be difficult to maintain sinus rhythm in the setting of his lung cancer.  Would not proceed with cardioversion in the setting of acute COVID infection.   2 hypertension-blood Pressure controlled.  Continue cardizem   3 lung cancer-Per oncology.   [place] per primary care.  Patient could be discharged from a cardiac standpoint with Cardizem CD 300 mg daily, apixaban 5 mg twice daily; continue off of HCTZ.  We will arrange follow-up in atrial fibrillation clinic 2 to 4 weeks following discharge.  Cardiology will sign off.  Please call with questions.  For questions or updates, please contact [place] Please consult [email or web address] for contact info under        Signed, Kirk Ruths, MD  02/14/2021, 9:11 AM

## 2021-02-14 NOTE — TOC Progression Note (Signed)
Transition of Care [facility]) - Progression Note    Patient Details  Name: James [name] MRN: [id number] Date of Birth: 1945-06-05  Transition of Care [facility] CM/SW Contact  Purcell Mouton, RN Phone Number: 02/14/2021, 3:18 PM  Clinical Narrative:    Spoke with pt's wife concerning HH. Pt will continue with [facility] for Home Health.    Expected Discharge Plan: [place] Barriers to Discharge: No Barriers Identified  Expected Discharge Plan and Services Expected Discharge Plan: [place] arrangements for the past 2 months: Single Family Home                           HH Arranged: PT [place]: [place] ([facility]) Date HH Agency Contacted: 02/14/21 Time [place]: [facility] Representative spoke with at [place]: [place] ([place]) Interventions    Readmission Risk Interventions No flowsheet data found.

## 2021-02-14 NOTE — Progress Notes (Signed)
Progress Note    James [name]   XBD:[id number]  DOB: Jun 27, 1945  DOA: 02/11/2021     2 PCP: [name], MD  Initial CC: elevated HR  Hospital Course: James [name] is a 75 y.o. male with PMH chronic hypoxic respiratory failure on 4 L O2 at home, allergies, left eye blindness, COPD, depression, DM II, diverticulosis, glaucoma, HLD, HTN, insomnia, hemorrhoids, iron deficiency anemia, OA, Parkinson's disease who was recently diagnosed with stage III left-sided lung cancer.  He has recently been started on radiation outpatient with plans for chemotherapy to start on 02/20/2021. He was undergoing evaluation at the cancer center prior to admission when he was found to have elevated heart rate and was sent to the ER for further evaluation.  He was then found to be in new onset atrial fibrillation with RVR.  He was admitted for further work-up and cardiology evaluation.  Interval History:  Patient went back into RVR overnight requiring reinitiation of Cardizem drip.  This morning oral Cardizem dose was increased per cardiology.   Patient otherwise asymptomatic and feels okay.  He understands need for remaining in hospital for ongoing COVID treatment.  He has been receiving radiation during hospitalization as well for his lung cancer.  Assessment & Plan: * New onset atrial flutter ([place])- (present on admission) - Felt to be precipitated by underlying COVID infection and possibly from underlying cancer and recent radiation treatments - Initially started on Cardizem drip and heparin drip - Cardiology following, patient assistance - Now is transitioned to Eliquis and cardizem PO - did require cardizem overnight of 12/21 due to RVR; his cardizem dose was increased further this am per cardiology and drip will the discontinued once again - follow up HR response with above changes  - see abnormal TSH also   COVID-19 virus infection- (present on admission) - At risk for progression of disease  given underlying malignancy and immunocompromised state - started on rocephin and azithro empirically as well due to concern for risk of bacterial infection - Given drug interactions, remdesivir is preferred drug of choice at this time.  Will need at least 3-day course and we will monitor his response clinically -Trend inflammatory markers -Continue steroids as well given chronic hypoxia at home  Abnormal TSH - TSH suppressed on admission, 0.224 - No known history of thyroid dysfunction - Free T4 also elevated, 1.31.  Total T3 noted to be in normal range but not as pertinent given fast systemic metabolism -Obtain thyroid ultrasound for further evaluation - Further plan to be determined after ultrasound  Acute on chronic respiratory failure with hypoxia (HCC) - On 4 L chronically at home at rest and 5 L with ambulation - Continue O2, currently at baseline - Continue steroids  Iron deficiency anemia- (present on admission) - Iron stores low.  Agree he likely needs iron transfusion but will hold off in setting of acute infection and can consider infusion towards end of hospitalization  Primary cancer of left lower lobe of lung ([place])- (present on admission) - follows outpatient with oncology - started on radiation within past 1-2 weeks and currently continuing radiation in hospital - Chemo was to begin on 02/20/2021 as well  COPD with emphysema ([place])- (present on admission) - continue nebs/breathing treatments - continue IS and flutter   Depression- (present on admission) - Continue sertraline  Parkinson's disease ([place])- (present on admission) - Continue Sinemet  Diabetes mellitus, type 2 ([place]) - follow up A1c - continue SSI and CBG monitoring  Hyperlipidemia- (present on admission) - hold treatment for now  Hypertension- (present on admission) - Continue Cardizem    Old records reviewed in assessment of this patient  Antimicrobials: Remdesivir 02/13/2021 >>  current  DVT prophylaxis: Eliquis  Code Status:   Code Status: Full Code  Disposition Plan: Home Status is: Inpatient  Objective: Blood pressure 119/69, pulse 75, temperature 97.6 F (36.4 C), temperature source Axillary, resp. rate 16, height 6\' 1"  (1.854 m), weight 91.3 kg, SpO2 97 %.  Examination:  Physical Exam Constitutional:      General: He is not in acute distress.    Appearance: Normal appearance.  HENT:     Head: Normocephalic and atraumatic.     Mouth/Throat:     Mouth: Mucous membranes are moist.  Eyes:     Extraocular Movements: Extraocular movements intact.  Cardiovascular:     Rate and Rhythm: Normal rate. Rhythm irregular.     Heart sounds: Normal heart sounds.  Pulmonary:     Effort: Pulmonary effort is normal. No respiratory distress.     Breath sounds: Normal breath sounds. No wheezing.  Abdominal:     General: Bowel sounds are normal. There is no distension.     Palpations: Abdomen is soft.     Tenderness: There is no abdominal tenderness.  Musculoskeletal:        General: Normal range of motion.     Cervical back: Normal range of motion and neck supple.  Skin:    General: Skin is warm and dry.  Neurological:     General: No focal deficit present.     Mental Status: He is alert.  Psychiatric:        Mood and Affect: Mood normal.        Behavior: Behavior normal.     Consultants:  Cardiology  Procedures:    Data Reviewed: I have personally reviewed labs and imaging studies    LOS: 2 days  Time spent: Greater than 50% of the 35 minute visit was spent in counseling/coordination of care for the patient as laid out in the A&P.   Dwyane Dee, MD [facility] 02/14/2021, 3:25 PM

## 2021-02-15 ENCOUNTER — Ambulatory Visit
Admission: RE | Admit: 2021-02-15 | Discharge: 2021-02-15 | Disposition: A | Discharge status: Home / Self Care | Payer: Medicare Other | Source: Ambulatory Visit | Attending: Radiation Oncology | Admitting: Radiation Oncology

## 2021-02-15 DIAGNOSIS — J9621 Acute and chronic respiratory failure with hypoxia: Secondary | ICD-10-CM

## 2021-02-15 DIAGNOSIS — R7989 Other specified abnormal findings of blood chemistry: Secondary | ICD-10-CM

## 2021-02-15 LAB — CBC WITH DIFFERENTIAL/PLATELET
Abs Immature Granulocytes: 0.11 10*3/uL — ABNORMAL HIGH (ref 0.00–0.07)
Basophils Absolute: 0 10*3/uL (ref 0.0–0.1)
Basophils Relative: 0 %
Eosinophils Absolute: 0 10*3/uL (ref 0.0–0.5)
Eosinophils Relative: 0 %
HCT: 32.2 % — ABNORMAL LOW (ref 39.0–52.0)
Hemoglobin: 9 g/dL — ABNORMAL LOW (ref 13.0–17.0)
Immature Granulocytes: 1 %
Lymphocytes Relative: 4 %
Lymphs Abs: 0.4 10*3/uL — ABNORMAL LOW (ref 0.7–4.0)
MCH: 20.1 pg — ABNORMAL LOW (ref 26.0–34.0)
MCHC: 28 g/dL — ABNORMAL LOW (ref 30.0–36.0)
MCV: 71.9 fL — ABNORMAL LOW (ref 80.0–100.0)
Monocytes Absolute: 1 10*3/uL (ref 0.1–1.0)
Monocytes Relative: 10 %
Neutro Abs: 8.6 10*3/uL — ABNORMAL HIGH (ref 1.7–7.7)
Neutrophils Relative %: 85 %
Platelets: 506 10*3/uL — ABNORMAL HIGH (ref 150–400)
RBC: 4.48 MIL/uL (ref 4.22–5.81)
RDW: 18.6 % — ABNORMAL HIGH (ref 11.5–15.5)
WBC: 10.1 10*3/uL (ref 4.0–10.5)
nRBC: 0.2 % (ref 0.0–0.2)

## 2021-02-15 LAB — GLUCOSE, CAPILLARY
Glucose-Capillary: 144 mg/dL — ABNORMAL HIGH (ref 70–99)
Glucose-Capillary: 186 mg/dL — ABNORMAL HIGH (ref 70–99)
Glucose-Capillary: 173 mg/dL — ABNORMAL HIGH (ref 70–99)
Glucose-Capillary: 261 mg/dL — ABNORMAL HIGH (ref 70–99)

## 2021-02-15 LAB — COMPREHENSIVE METABOLIC PANEL
ALT: <5 U/L (ref 0–44)
AST: 9 U/L — ABNORMAL LOW (ref 15–41)
Albumin: 3.1 g/dL — ABNORMAL LOW (ref 3.5–5.0)
Alkaline Phosphatase: 57 U/L (ref 38–126)
Anion gap: 8 (ref 5–15)
BUN: 19 mg/dL (ref 8–23)
CO2: 30 mmol/L (ref 22–32)
Calcium: 9.5 mg/dL (ref 8.9–10.3)
Chloride: 102 mmol/L (ref 98–111)
Creatinine, Ser: 0.81 mg/dL (ref 0.61–1.24)
GFR, Estimated: >60 mL/min (ref >60)
Glucose, Bld: 130 mg/dL — ABNORMAL HIGH (ref 70–99)
Potassium: 4.3 mmol/L (ref 3.5–5.1)
Sodium: 140 mmol/L (ref 135–145)
Total Bilirubin: 0.4 mg/dL (ref 0.3–1.2)
Total Protein: 6.5 g/dL (ref 6.5–8.1)

## 2021-02-15 LAB — MAGNESIUM: Magnesium: 2.4 mg/dL (ref 1.7–2.4)

## 2021-02-15 LAB — FERRITIN: Ferritin: 58 ng/mL (ref 24–336)

## 2021-02-15 LAB — PHOSPHORUS: Phosphorus: 2.9 mg/dL (ref 2.5–4.6)

## 2021-02-15 LAB — D-DIMER, QUANTITATIVE: D-Dimer, Quant: 3.07 ug/mL-FEU — ABNORMAL HIGH (ref 0.00–0.50)

## 2021-02-15 LAB — C-REACTIVE PROTEIN: CRP: 1.8 mg/dL — ABNORMAL HIGH (ref <1.0)

## 2021-02-15 LAB — LACTATE DEHYDROGENASE: LDH: 107 U/L (ref 98–192)

## 2021-02-15 NOTE — Progress Notes (Addendum)
PT Cancellation Note  Patient Details Name: James [name] MRN: [id number] DOB: 25-Jul-1945   Cancelled Treatment:    Reason Eval/Treat Not Completed: Pt declined to participate at this time. He reports he has been having some "chest issues". He requested PT be held until he sees MD today. Will check back as schedule allows.    [facility]  Office: [phone] Pager: [phone]

## 2021-02-15 NOTE — Care Management Important Message (Signed)
Important Message  Patient Details IM Letter placed in Patients room. Name: James [name] MRN: [id number] Date of Birth: 10-17-1945   Medicare Important Message Given:  Yes     Kerin Salen 02/15/2021, 1:38 PM

## 2021-02-15 NOTE — Progress Notes (Signed)
Progress Note    James [name]   HMC:[id number]  DOB: May 02, 1945  DOA: 02/11/2021     3 PCP: [name], MD  Initial CC: elevated HR  Hospital Course: James [name] is a 75 y.o. male with PMH chronic hypoxic respiratory failure on 4 L O2 at home, allergies, left eye blindness, COPD, depression, DM II, diverticulosis, glaucoma, HLD, HTN, insomnia, hemorrhoids, iron deficiency anemia, OA, Parkinson's disease who was recently diagnosed with stage III left-sided lung cancer.  He has recently been started on radiation outpatient with plans for chemotherapy to start on 02/20/2021. He was undergoing evaluation at the cancer center prior to admission when he was found to have elevated heart rate and was sent to the ER for further evaluation.  He was then found to be in new onset atrial fibrillation with RVR.  He was admitted for further work-up and cardiology evaluation.  Interval History:  No events overnight.  Resting in bed comfortably this morning.  Feeling okay in general.  He has tolerated radiation treatment during hospitalization.  Tentative plan is for discharge home tomorrow after last dose of Remdesivir.   Assessment & Plan: * New onset atrial flutter ([place])- (present on admission) - Felt to be precipitated by underlying COVID infection and possibly from underlying cancer and recent radiation treatments and/or abnormal thyroid - Initially started on Cardizem drip and heparin drip - Cardiology following, patient assistance - Now is transitioned to Eliquis and cardizem PO - did require cardizem overnight of 12/21 due to RVR; his cardizem dose was increased further this am per cardiology and drip will the discontinued once again - follow up HR response with above changes  - see abnormal TSH also   COVID-19 virus infection- (present on admission) - At risk for progression of disease given underlying malignancy and immunocompromised state - started on rocephin and azithro  empirically as well due to concern for risk of bacterial infection - Given drug interactions, remdesivir is preferred drug of choice at this time.  Will need at least 3-day course and we will monitor his response clinically -Trend inflammatory markers -Continue steroids as well given chronic hypoxia at home  Abnormal TSH - TSH suppressed on admission, 0.224 - No known history of thyroid dysfunction - Free T4 also elevated, 1.31.  Total T3 noted to be in normal range but not as pertinent given fast systemic metabolism -Ultrasound shows multiple nodules bilaterally.  Concerning nodule is the left inferior nodule measuring 2.1 cm which has increased in size from 1.1 cm - FNA recommended per TI-RADS criteria of thyroid nodule; will try to coordinate this as close outpatient followup given planned d/c Saturday   Acute on chronic respiratory failure with hypoxia ([place]) - On 4 L chronically at home at rest and 5 L with ambulation - Continue O2, currently at baseline - Continue steroids  Iron deficiency anemia- (present on admission) - Iron stores low.  Agree he likely needs iron transfusion but will hold off in setting of acute infection and can consider infusion towards end of hospitalization  Primary cancer of left lower lobe of lung ([place])- (present on admission) - follows outpatient with oncology - started on radiation within past 1-2 weeks and currently continuing radiation in hospital - Chemo was to begin on 02/20/2021 as well  COPD with emphysema ([place])- (present on admission) - continue nebs/breathing treatments - continue IS and flutter   Depression- (present on admission) - Continue sertraline  Parkinson's disease ([place])- (present on admission) - Continue  Sinemet  Diabetes mellitus, type 2 ([facility]) - A1c 5.8% on 02/12/2021 - continue SSI and CBG monitoring   Hyperlipidemia- (present on admission) - hold treatment for now  Hypertension- (present on admission) - Continue  Cardizem    Old records reviewed in assessment of this patient  Antimicrobials: Remdesivir 02/13/2021 >> current  DVT prophylaxis: Eliquis  Code Status:   Code Status: Full Code  Disposition Plan: Home Status is: Inpatient  Objective: Blood pressure (!) 149/69, pulse (!) 101, temperature 98 F (36.7 C), temperature source Oral, resp. rate 18, height 6\' 1"  (1.854 m), weight 91.3 kg, SpO2 95 %.  Examination:  Physical Exam Constitutional:      General: He is not in acute distress.    Appearance: Normal appearance.  HENT:     Head: Normocephalic and atraumatic.     Mouth/Throat:     Mouth: Mucous membranes are moist.  Eyes:     Extraocular Movements: Extraocular movements intact.  Cardiovascular:     Rate and Rhythm: Normal rate and regular rhythm.     Heart sounds: Normal heart sounds.  Pulmonary:     Effort: Pulmonary effort is normal. No respiratory distress.     Breath sounds: Normal breath sounds. No wheezing.  Abdominal:     General: Bowel sounds are normal. There is no distension.     Palpations: Abdomen is soft.     Tenderness: There is no abdominal tenderness.  Musculoskeletal:        General: Normal range of motion.     Cervical back: Normal range of motion and neck supple.  Skin:    General: Skin is warm and dry.  Neurological:     General: No focal deficit present.     Mental Status: He is alert.  Psychiatric:        Mood and Affect: Mood normal.        Behavior: Behavior normal.     Consultants:  Cardiology  Procedures:    Data Reviewed: I have personally reviewed labs and imaging studies    LOS: 3 days  Time spent: Greater than 50% of the 35 minute visit was spent in counseling/coordination of care for the patient as laid out in the A&P.   Dwyane Dee, MD [facility] 02/15/2021, 5:51 PM

## 2021-02-16 DIAGNOSIS — D500 Iron deficiency anemia secondary to blood loss (chronic): Secondary | ICD-10-CM

## 2021-02-16 LAB — LACTATE DEHYDROGENASE: LDH: 131 U/L (ref 98–192)

## 2021-02-16 LAB — CBC WITH DIFFERENTIAL/PLATELET
Abs Immature Granulocytes: 0.08 10*3/uL — ABNORMAL HIGH (ref 0.00–0.07)
Basophils Absolute: 0 10*3/uL (ref 0.0–0.1)
Basophils Relative: 0 %
Eosinophils Absolute: 0 10*3/uL (ref 0.0–0.5)
Eosinophils Relative: 0 %
HCT: 32.8 % — ABNORMAL LOW (ref 39.0–52.0)
Hemoglobin: 9.3 g/dL — ABNORMAL LOW (ref 13.0–17.0)
Immature Granulocytes: 1 %
Lymphocytes Relative: 6 %
Lymphs Abs: 0.4 10*3/uL — ABNORMAL LOW (ref 0.7–4.0)
MCH: 20.3 pg — ABNORMAL LOW (ref 26.0–34.0)
MCHC: 28.4 g/dL — ABNORMAL LOW (ref 30.0–36.0)
MCV: 71.5 fL — ABNORMAL LOW (ref 80.0–100.0)
Monocytes Absolute: 0.8 10*3/uL (ref 0.1–1.0)
Monocytes Relative: 9 %
Neutro Abs: 6.8 10*3/uL (ref 1.7–7.7)
Neutrophils Relative %: 84 %
Platelets: 472 10*3/uL — ABNORMAL HIGH (ref 150–400)
RBC: 4.59 MIL/uL (ref 4.22–5.81)
RDW: 18.6 % — ABNORMAL HIGH (ref 11.5–15.5)
WBC: 8.1 10*3/uL (ref 4.0–10.5)
nRBC: 0.2 % (ref 0.0–0.2)

## 2021-02-16 LAB — COMPREHENSIVE METABOLIC PANEL WITH GFR
ALT: <5 U/L (ref 0–44)
AST: 13 U/L — ABNORMAL LOW (ref 15–41)
Albumin: 3 g/dL — ABNORMAL LOW (ref 3.5–5.0)
Alkaline Phosphatase: 64 U/L (ref 38–126)
Anion gap: 9 (ref 5–15)
BUN: 17 mg/dL (ref 8–23)
CO2: 29 mmol/L (ref 22–32)
Calcium: 9.6 mg/dL (ref 8.9–10.3)
Chloride: 100 mmol/L (ref 98–111)
Creatinine, Ser: 0.68 mg/dL (ref 0.61–1.24)
GFR, Estimated: >60 mL/min
Glucose, Bld: 174 mg/dL — ABNORMAL HIGH (ref 70–99)
Potassium: 4.3 mmol/L (ref 3.5–5.1)
Sodium: 138 mmol/L (ref 135–145)
Total Bilirubin: 0.4 mg/dL (ref 0.3–1.2)
Total Protein: 6.5 g/dL (ref 6.5–8.1)

## 2021-02-16 LAB — GLUCOSE, CAPILLARY: Glucose-Capillary: 136 mg/dL — ABNORMAL HIGH (ref 70–99)

## 2021-02-16 LAB — C-REACTIVE PROTEIN: CRP: 1.2 mg/dL — ABNORMAL HIGH

## 2021-02-16 LAB — PHOSPHORUS: Phosphorus: 3.3 mg/dL (ref 2.5–4.6)

## 2021-02-16 LAB — FERRITIN: Ferritin: 48 ng/mL (ref 24–336)

## 2021-02-16 LAB — D-DIMER, QUANTITATIVE: D-Dimer, Quant: 2.49 ug{FEU}/mL — ABNORMAL HIGH (ref 0.00–0.50)

## 2021-02-16 LAB — MAGNESIUM: Magnesium: 2.4 mg/dL (ref 1.7–2.4)

## 2021-02-16 MED ORDER — APIXABAN 5 MG PO TABS: 5.0000 mg | ORAL_TABLET | Freq: Two times a day (BID) | ORAL | 3 refills | Status: AC

## 2021-02-16 MED ORDER — METOPROLOL TARTRATE 25 MG PO TABS: 12.5000 mg | ORAL_TABLET | Freq: Two times a day (BID) | ORAL | Status: DC

## 2021-02-16 MED ORDER — METOPROLOL TARTRATE 25 MG PO TABS
12.5000 mg | ORAL_TABLET | Freq: Once | ORAL | Status: AC
  Administered 2021-02-16: 14:00:00 12.5 mg via ORAL
  Filled 2021-02-16: qty 1

## 2021-02-16 MED ORDER — DEXAMETHASONE 6 MG PO TABS: 6.0000 mg | ORAL_TABLET | Freq: Every day | ORAL | 0 refills | Status: AC

## 2021-02-16 MED ORDER — DILTIAZEM HCL ER COATED BEADS 300 MG PO CP24: 300.0000 mg | ORAL_CAPSULE | Freq: Every day | ORAL | 3 refills | Status: DC

## 2021-02-16 MED ORDER — METOPROLOL TARTRATE 25 MG PO TABS: 12.5000 mg | ORAL_TABLET | Freq: Two times a day (BID) | ORAL | 3 refills | Status: DC

## 2021-02-16 MED ORDER — METOPROLOL TARTRATE 5 MG/5ML IV SOLN
5.0000 mg | Freq: Once | INTRAVENOUS | Status: AC
  Administered 2021-02-16: 14:00:00 5 mg via INTRAVENOUS
  Filled 2021-02-16: qty 5

## 2021-02-16 NOTE — Plan of Care (Signed)
Pt HR sustained 110s much of morning, but around 1330 pt HR sustained 130-150s. MD notified. New meds ordered and administered. Pt belongings gathered by spouse. DC paperwork reviewed with pt and pouse. PIV removed.   Problem: Education: Goal: Knowledge of General Education information will improve Description: Including pain rating scale, medication(s)/side effects and non-pharmacologic comfort measures Outcome: Completed/Met   Problem: Health Behavior/Discharge Planning: Goal: Ability to manage health-related needs will improve Outcome: Completed/Met   Problem: Clinical Measurements: Goal: Ability to maintain clinical measurements within normal limits will improve Outcome: Completed/Met Goal: Will remain free from infection Outcome: Completed/Met Goal: Diagnostic test results will improve Outcome: Completed/Met Goal: Cardiovascular complication will be avoided Outcome: Completed/Met   Problem: Activity: Goal: Risk for activity intolerance will decrease Outcome: Completed/Met   Problem: Pain Managment: Goal: General experience of comfort will improve Outcome: Completed/Met   Problem: Safety: Goal: Ability to remain free from injury will improve Outcome: Completed/Met   Problem: Skin Integrity: Goal: Risk for impaired skin integrity will decrease Outcome: Completed/Met

## 2021-02-16 NOTE — Discharge Summary (Signed)
Physician Discharge Summary   James [name] GYI:[id number] DOB: 1945-09-13 DOA: 02/11/2021  PCP: [name], MD  Admit date: 02/11/2021 Discharge date: 02/16/2021   Admitted From: Home Disposition:  Home Discharging physician: Dwyane Dee, MD  Recommendations for Outpatient Follow-up:  Needs FNA of left thyroid nodule Added lopressor in addition to cardizem prior to discharge; may need adjustment depending on HR/BP Follow up with cardiology May need initiation of iron supplementation or infusion at next oncology follow up  Outpatient chemo and radiation plan per oncology   Home Health:  Equipment/Devices:   Discharge Condition: stable CODE STATUS: Full Diet recommendation:  Diet Orders (From admission, onward)     Start     Ordered   02/16/21 0000  Diet - low sodium heart healthy        02/16/21 1054   02/11/21 1745  Diet Heart Room service appropriate? Yes; Fluid consistency: Thin  Diet effective now       Question Answer Comment  Room service appropriate? Yes   Fluid consistency: Thin      02/11/21 1746            Hospital Course: James [name] is a 75 y.o. male with PMH chronic hypoxic respiratory failure on 4 L O2 at home, allergies, left eye blindness, COPD, depression, DM II, diverticulosis, glaucoma, HLD, HTN, insomnia, hemorrhoids, iron deficiency anemia, OA, Parkinson's disease who was recently diagnosed with stage III left-sided lung cancer.  He has recently been started on radiation outpatient with plans for chemotherapy to start on 02/20/2021. He was undergoing evaluation at the cancer center prior to admission when he was found to have elevated heart rate and was sent to the ER for further evaluation.  He was then found to be in new onset atrial fibrillation with RVR.  He was admitted for further work-up and cardiology evaluation.  * New onset atrial flutter ([place])- (present on admission) - Felt to be precipitated by underlying COVID infection  and possibly from underlying cancer and recent radiation treatments and/or abnormal thyroid - Initially started on Cardizem drip and heparin drip - Cardiology following, assistance appreciated - Now is transitioned to Eliquis and cardizem PO - did require cardizem overnight of 12/21 due to RVR; his cardizem dose was increased further per cardiology to 300 mg daily - HR still uncontrolled and sustained with rates in the 130s to 140s prior to discharge but trial of Lopressor IV given with good response and HR back to 80s, and since blood pressure already low/normal on his dose of Cardizem, therefore adding low-dose Lopressor at discharge and parameters given for when to hold; he will need to follow-up closely with cardiology after discharge for any further adjustments - regimen at discharge: Cardizem 300 mg daily and Lopressor 12.5 mg BID - see abnormal TSH also   COVID-19 virus infection- (present on admission) - At risk for progression of disease given underlying malignancy and immunocompromised state - started on rocephin and azithro empirically as well due to concern for risk of bacterial infection - Given drug interactions, remdesivir is preferred drug of choice at this time.  Completed 5 day course of remdesivir on 12/24 -Remainder of Decadron course prescribed at discharge  Abnormal TSH - TSH suppressed on admission, 0.224 - No known history of thyroid dysfunction - Free T4 also elevated, 1.31.  Total T3 noted to be in normal range but not as pertinent given fast systemic metabolism -Ultrasound shows multiple nodules bilaterally.  Concerning nodule is the left inferior  nodule measuring 2.1 cm which has increased in size from 1.1 cm - FNA recommended per TI-RADS criteria of thyroid nodule; will need further evaluation of this at follow up and will forward d/c summary to PCP as well   Acute on chronic respiratory failure with hypoxia ([place]) - On 4 L chronically at home at rest and 5 L with  ambulation - Continue O2, currently at baseline - Continue steroids as per above   Iron deficiency anemia- (present on admission) - Iron stores low. Infusion deferred inpatient in setting of infection  Primary cancer of left lower lobe of lung ([place])- (present on admission) - follows outpatient with oncology - started on radiation within past 1-2 weeks and currently continuing radiation in hospital - Chemo was to begin on 02/20/2021 as well  COPD with emphysema ([place])- (present on admission) - continue nebs/breathing treatments - continue IS and flutter   Depression- (present on admission) - Continue sertraline  Parkinson's disease ([place])- (present on admission) - Continue Sinemet  Diabetes mellitus, type 2 ([place]) - A1c 5.8% on 02/12/2021 - continue SSI and CBG monitoring   Hyperlipidemia- (present on admission) - statin resumed at d/c   Hypertension- (present on admission) - Continue Cardizem    The patient's chronic medical conditions were treated accordingly per the patient's home medication regimen except as noted.  On day of discharge, patient was felt deemed stable for discharge. Patient/family member advised to call PCP or come back to ER if needed.   Principal Diagnosis: New onset atrial flutter [facility])  Discharge Diagnoses: Principal Problem:   New onset atrial flutter ([facility]) Active Problems:   COVID-19 virus infection   Abnormal TSH   Acute on chronic respiratory failure with hypoxia ([facility])   Primary cancer of left lower lobe of lung ([facility])   Iron deficiency anemia   COPD with emphysema ([facility])   Hypertension   Hyperlipidemia   Diabetes mellitus, type 2 (H[facility])   Parkinson's disease ([place])   Depression   Mild protein malnutrition ([place])   Discharge Instructions     Amb referral to AFIB Clinic   Complete by: As directed    Diet - low sodium heart healthy   Complete by: As directed    Increase activity slowly   Complete by: As directed       Allergies as of  02/16/2021       Reactions   Aspirin Other (See Comments)   GI bleed   Codeine Itching   Morphine Itching        Medication List     STOP taking these medications    hydrochlorothiazide 25 MG tablet Commonly known as: HYDRODIURIL   HYDROcodone bit-homatropine 5-1.5 MG/5ML syrup Commonly known as: HYCODAN       TAKE these medications    apixaban 5 MG Tabs tablet Commonly known as: ELIQUIS Take 1 tablet (5 mg total) by mouth 2 (two) times daily.   atorvastatin 20 MG tablet Commonly known as: LIPITOR Take 20 mg by mouth at bedtime.   buPROPion 300 MG 24 hr tablet Commonly known as: WELLBUTRIN XL Take 150 mg by mouth 2 (two) times daily.   calcium-vitamin D 250-125 MG-UNIT tablet Commonly known as: OSCAL WITH D Take 1 tablet by mouth daily.   carbidopa-levodopa 25-100 MG tablet Commonly known as: SINEMET IR TAKE 1.5 TABLETS BY MOUTH 3 TIMES DAILY. What changed: See the new instructions.   cetirizine 10 MG tablet Commonly known as: ZYRTEC Take 10 mg by mouth 2 (two) times  daily.   dexamethasone 6 MG tablet Commonly known as: DECADRON Take 1 tablet (6 mg total) by mouth daily for 5 days. Start taking on: February 17, 2021   diltiazem 300 MG 24 hr capsule Commonly known as: CARDIZEM CD Take 1 capsule (300 mg total) by mouth daily. Start taking on: February 17, 2021   donepezil 10 MG tablet Commonly known as: ARICEPT Take 1 tablet (10 mg total) by mouth at bedtime.   ferrous gluconate 324 MG tablet Commonly known as: FERGON TAKE 1 TABLET BY MOUTH DAILY WITH BREAKFAST What changed: how to take this   formoterol 20 MCG/2ML nebulizer solution Commonly known as: PERFOROMIST Take 2 mLs (20 mcg total) by nebulization 2 (two) times daily. What changed:  when to take this reasons to take this   Green Tea 315 MG Caps Take 315 mg by mouth daily.   ibuprofen 800 MG tablet Commonly known as: ADVIL Take 1 tablet (800 mg total) by mouth every 8 (eight)  hours as needed for moderate pain.   MEGARED OMEGA-3 KRILL OIL PO Take 1 capsule by mouth daily.   metFORMIN 500 MG tablet Commonly known as: GLUCOPHAGE Take 1 tablet (500 mg total) by mouth 2 (two) times daily with a meal.   metoprolol tartrate 25 MG tablet Commonly known as: LOPRESSOR Take 0.5 tablets (12.5 mg total) by mouth 2 (two) times daily. Hold if heart rate less than 60 or blood pressure less than 100/60   mirtazapine 15 MG tablet Commonly known as: REMERON Take 15 mg by mouth at bedtime.   modafinil 100 MG tablet Commonly known as: PROVIGIL Take 1 tablet (100 mg total) by mouth daily.   multivitamin with minerals tablet Take 1 tablet by mouth daily. Centrum   nitroGLYCERIN 0.4 MG SL tablet Commonly known as: NITROSTAT Place 1 tablet (0.4 mg total) under the tongue every 5 (five) minutes as needed for chest pain.   omeprazole 20 MG tablet Commonly known as: PRILOSEC OTC Take 20 mg by mouth every evening.   OXYGEN Inhale 4-6 L into the lungs continuous.   potassium chloride SA 20 MEQ tablet Commonly known as: KLOR-CON M Take 1 tablet (20 mEq total) by mouth 2 (two) times daily.   prochlorperazine 10 MG tablet Commonly known as: COMPAZINE Take 1 tablet (10 mg total) by mouth every 6 (six) hours as needed for nausea or vomiting.   revefenacin 175 MCG/3ML nebulizer solution Commonly known as: YUPELRI Take 3 mLs (175 mcg total) by nebulization daily.   sertraline 100 MG tablet Commonly known as: ZOLOFT Take 150 mg by mouth daily.   traMADol 50 MG tablet Commonly known as: ULTRAM Take 1 tablet (50 mg total) by mouth every 6 (six) hours as needed. What changed: reasons to take this        Follow-up Information     [name], MD. Schedule an appointment as soon as possible for a visit in 1 week(s).   Specialty: Internal Medicine Why: Needs further evaluation of left thyroid nodule Contact information: [place] [address] [phone]         Minus Breeding, MD. Schedule an appointment as soon as possible for a visit in 1 week(s).   Specialty: Cardiology Contact information: [address] [place] [address] [phone]                Allergies  Allergen Reactions   Aspirin Other (See Comments)    GI bleed  Codeine Itching   Morphine Itching    Consultations: Cardiology  Discharge Exam: BP 114/60 (BP Location: Left Arm)    Pulse 91    Temp 98.1 F (36.7 C) (Oral)    Resp 18    Ht 6\' 1"  (1.854 m)    Wt 91.3 kg    SpO2 97%    BMI 26.55 kg/m  Physical Exam Constitutional:      General: He is not in acute distress.    Appearance: Normal appearance.  HENT:     Head: Normocephalic and atraumatic.     Mouth/Throat:     Mouth: Mucous membranes are moist.  Eyes:     Extraocular Movements: Extraocular movements intact.  Cardiovascular:     Rate and Rhythm: Normal rate and regular rhythm.     Heart sounds: Normal heart sounds.  Pulmonary:     Effort: Pulmonary effort is normal. No respiratory distress.     Breath sounds: Normal breath sounds. No wheezing.  Abdominal:     General: Bowel sounds are normal. There is no distension.     Palpations: Abdomen is soft.     Tenderness: There is no abdominal tenderness.  Musculoskeletal:        General: Normal range of motion.     Cervical back: Normal range of motion and neck supple.  Skin:    General: Skin is warm and dry.  Neurological:     General: No focal deficit present.     Mental Status: He is alert.  Psychiatric:        Mood and Affect: Mood normal.        Behavior: Behavior normal.     The results of significant diagnostics from this hospitalization (including imaging, microbiology, ancillary and laboratory) are listed below for reference.   Microbiology: Recent Results (from the past 240 hour(s))  Resp Panel by RT-PCR (Flu A&B, Covid) Nasopharyngeal Swab     Status: Abnormal   Collection Time:  02/11/21  3:26 PM   Specimen: Nasopharyngeal Swab; Nasopharyngeal(NP) swabs in vial transport medium  Result Value Ref Range Status   SARS Coronavirus 2 by RT PCR POSITIVE (A) NEGATIVE Final    Comment: (NOTE) SARS-CoV-2 target nucleic acids are DETECTED.  The SARS-CoV-2 RNA is generally detectable in upper respiratory specimens during the acute phase of infection. Positive results are indicative of the presence of the identified virus, but do not rule out bacterial infection or co-infection with other pathogens not detected by the test. Clinical correlation with patient history and other diagnostic information is necessary to determine patient infection status. The expected result is Negative.  Fact Sheet for Patients: EntrepreneurPulse.com.au  Fact Sheet for Healthcare Providers: IncredibleEmployment.be  This test is not yet approved or cleared by the [place] FDA and  has been authorized for detection and/or diagnosis of SARS-CoV-2 by FDA under an Emergency Use Authorization (EUA).  This EUA will remain in effect (meaning this test can be used) for the duration of  the COVID-19 declaration under Section 564(b)(1) of the A ct, 21 U.S.C. section 360bbb-3(b)(1), unless the authorization is terminated or revoked sooner.     Influenza A by PCR NEGATIVE NEGATIVE Final   Influenza B by PCR NEGATIVE NEGATIVE Final    Comment: (NOTE) The Xpert Xpress SARS-CoV-2/FLU/RSV plus assay is intended as an aid in the diagnosis of influenza from Nasopharyngeal swab specimens and should not be used as a sole basis for treatment. Nasal washings and aspirates are unacceptable for Xpert Xpress SARS-CoV-2/FLU/RSV  testing.  Fact Sheet for Patients: EntrepreneurPulse.com.au  Fact Sheet for Healthcare Providers: IncredibleEmployment.be  This test is not yet approved or cleared by the [place] FDA and has been  authorized for detection and/or diagnosis of SARS-CoV-2 by FDA under an Emergency Use Authorization (EUA). This EUA will remain in effect (meaning this test can be used) for the duration of the COVID-19 declaration under Section 564(b)(1) of the Act, 21 U.S.C. section 360bbb-3(b)(1), unless the authorization is terminated or revoked.  Performed at [facility], [place] [address], [place], [place] [address]      Labs: BNP (last 3 results) Recent Labs    02/11/21 2225  BNP 676.1*   Basic Metabolic Panel: Recent Labs  Lab 02/12/21 0416 02/13/21 0439 02/14/21 0404 02/15/21 0434 02/16/21 0358  NA 141 137 140 140 138  K 4.1 4.3 4.4 4.3 4.3  CL 103 100 103 102 100  CO2 30 27 28 30 29   GLUCOSE 102* 164* 161* 130* 174*  BUN 13 16 21 19 17   CREATININE 0.79 0.82 0.80 0.81 0.68  CALCIUM 9.2 9.3 9.5 9.5 9.6  MG 2.1 2.1 2.2 2.4 2.4  PHOS 3.3 3.3 2.9 2.9 3.3   Liver Function Tests: Recent Labs  Lab 02/12/21 0416 02/13/21 0439 02/14/21 0404 02/15/21 0434 02/16/21 0358  AST 11* 12* 11* 9* 13*  ALT 5 7 7  <5 <5  ALKPHOS 62 62 59 57 64  BILITOT 0.7 0.5 0.4 0.4 0.4  PROT 6.4* 6.8 6.8 6.5 6.5  ALBUMIN 3.1* 3.1* 3.1* 3.1* 3.0*   No results for input(s): LIPASE, AMYLASE in the last 168 hours. No results for input(s): AMMONIA in the last 168 hours. CBC: Recent Labs  Lab 02/12/21 0416 02/13/21 0439 02/14/21 0404 02/15/21 0434 02/16/21 0358  WBC 8.4 4.8 11.4* 10.1 8.1  NEUTROABS 6.6 4.3 9.9* 8.6* 6.8  HGB 8.5* 8.7* 9.1* 9.0* 9.3*  HCT 31.2* 30.6* 32.5* 32.2* 32.8*  MCV 74.6* 70.8* 71.3* 71.9* 71.5*  PLT 412* 478* 540* 506* 472*   Cardiac Enzymes: No results for input(s): CKTOTAL, CKMB, CKMBINDEX, TROPONINI in the last 168 hours. BNP: Invalid input(s): POCBNP CBG: Recent Labs  Lab 02/15/21 0738 02/15/21 1153 02/15/21 1641 02/15/21 2037 02/16/21 0745  GLUCAP 144* 186* 173* 261* 136*   D-Dimer Recent Labs    02/15/21 0434 02/16/21 0358   DDIMER 3.07* 2.49*   Hgb A1c No results for input(s): HGBA1C in the last 72 hours. Lipid Profile No results for input(s): CHOL, HDL, LDLCALC, TRIG, CHOLHDL, LDLDIRECT in the last 72 hours. Thyroid function studies No results for input(s): TSH, T4TOTAL, T3FREE, THYROIDAB in the last 72 hours.  Invalid input(s): FREET3 Anemia work up Recent Labs    02/15/21 0434 02/16/21 0358  FERRITIN 58 48   Urinalysis No results found for: COLORURINE, APPEARANCEUR, LABSPEC, [place], GLUCOSEU, [place], [place], [place], PROTEINUR, UROBILINOGEN, NITRITE, LEUKOCYTESUR Sepsis Labs Invalid input(s): PROCALCITONIN,  WBC,  LACTICIDVEN Microbiology Recent Results (from the past 240 hour(s))  Resp Panel by RT-PCR (Flu A&B, Covid) Nasopharyngeal Swab     Status: Abnormal   Collection Time: 02/11/21  3:26 PM   Specimen: Nasopharyngeal Swab; Nasopharyngeal(NP) swabs in vial transport medium  Result Value Ref Range Status   SARS Coronavirus 2 by RT PCR POSITIVE (A) NEGATIVE Final    Comment: (NOTE) SARS-CoV-2 target nucleic acids are DETECTED.  The SARS-CoV-2 RNA is generally detectable in upper respiratory specimens during the acute phase of infection. Positive results are indicative of the presence of the  identified virus, but do not rule out bacterial infection or co-infection with other pathogens not detected by the test. Clinical correlation with patient history and other diagnostic information is necessary to determine patient infection status. The expected result is Negative.  Fact Sheet for Patients: EntrepreneurPulse.com.au  Fact Sheet for Healthcare Providers: IncredibleEmployment.be  This test is not yet approved or cleared by the [place] FDA and  has been authorized for detection and/or diagnosis of SARS-CoV-2 by FDA under an Emergency Use Authorization (EUA).  This EUA will remain in effect (meaning this test can be used) for the duration  of  the COVID-19 declaration under Section 564(b)(1) of the A ct, 21 U.S.C. section 360bbb-3(b)(1), unless the authorization is terminated or revoked sooner.     Influenza A by PCR NEGATIVE NEGATIVE Final   Influenza B by PCR NEGATIVE NEGATIVE Final    Comment: (NOTE) The Xpert Xpress SARS-CoV-2/FLU/RSV plus assay is intended as an aid in the diagnosis of influenza from Nasopharyngeal swab specimens and should not be used as a sole basis for treatment. Nasal washings and aspirates are unacceptable for Xpert Xpress SARS-CoV-2/FLU/RSV testing.  Fact Sheet for Patients: EntrepreneurPulse.com.au  Fact Sheet for Healthcare Providers: IncredibleEmployment.be  This test is not yet approved or cleared by the [place] FDA and has been authorized for detection and/or diagnosis of SARS-CoV-2 by FDA under an Emergency Use Authorization (EUA). This EUA will remain in effect (meaning this test can be used) for the duration of the COVID-19 declaration under Section 564(b)(1) of the Act, 21 U.S.C. section 360bbb-3(b)(1), unless the authorization is terminated or revoked.  Performed at [facility], [place] [address], [place], [place] [address]     Procedures/Studies: MR Brain W Wo Contrast  Result Date: 02/02/2021 CLINICAL DATA:  Initial evaluation for non-small cell lung cancer, staging. EXAM: MRI HEAD WITHOUT AND WITH CONTRAST TECHNIQUE: Multiplanar, multiecho pulse sequences of the brain and surrounding structures were obtained without and with intravenous contrast. CONTRAST:  76mL GADAVIST GADOBUTROL 1 MMOL/ML IV SOLN COMPARISON:  Prior PET-CT from 12/26/2020. FINDINGS: Brain: Generalized age-related cerebral atrophy. Scattered patchy T2/FLAIR hyperintensity involving the periventricular and deep white matter both cerebral hemispheres as well as the pons, most consistent with chronic small vessel ischemic disease, mild in nature. No  evidence for acute or subacute infarct. Gray-white matter differentiation maintained. No encephalomalacia to suggest chronic cortical infarction. No evidence for acute or chronic intracranial hemorrhage. No mass lesion, midline shift or mass effect. Mild ventricular prominence related global parenchymal volume loss without hydrocephalus. Pituitary gland suprasellar region within normal limits. Midline structures intact and normal. No abnormal enhancement or evidence for intracranial metastatic disease. Vascular: Major intracranial vascular flow voids are maintained. Skull and upper cervical spine: Craniocervical junction within normal limits. Bone marrow signal intensity normal. No visible focal marrow replacing lesion. No scalp soft tissue abnormality. Sinuses/Orbits: Patient status post bilateral ocular lens replacement. Globes and orbital soft tissues demonstrate no acute finding. Scattered mucosal thickening noted throughout the paranasal sinuses. Trace left mastoid effusion noted, of doubtful significance. Other: None. IMPRESSION: 1. No evidence for intracranial metastatic disease. 2. Mild age-related cerebral atrophy with chronic small vessel ischemic disease. Electronically Signed   By: Jeannine Boga M.D.   On: 02/02/2021 02:41   MR Pelvis W Wo Contrast  Result Date: 02/02/2021 CLINICAL DATA:  Lung cancer. Abnormal PET-CT with focal hypermetabolism anterior to the right femoral neck. EXAM: MRI PELVIS WITHOUT AND WITH CONTRAST TECHNIQUE: Multiplanar multisequence MR imaging of the pelvis  was performed both before and after administration of intravenous contrast. CONTRAST:  24mL GADAVIST GADOBUTROL 1 MMOL/ML IV SOLN COMPARISON:  PET-CT 12/26/2020 FINDINGS: Bones/Joint/Cartilage No acute fracture. No dislocation. No femoral head avascular necrosis. Bony pelvis intact without diastasis. Mild chondral thinning of both hip joints. No hip joint effusion. SI joints and pubic symphysis within normal  limits. No bone marrow edema. No marrow replacing bone lesion. Ligaments Intact. Muscles and Tendons The bilateral gluteal, hamstring, iliopsoas, rectus femoris, and adductor tendons appear intact without tear or significant tendinosis. Normal muscle bulk and signal intensity without edema, atrophy, or fatty infiltration. Soft tissues Adjacent to the anterior margin of the right femoral neck is a small nodular focal enhancing lesion measuring 1.9 x 1.1 x 1.4 cm (series 10, image 26; series 11, image 20). Lesion is intermediate to low signal on both T1 and T2 weighted imaging. It is difficult determine whether this lesion is intra-articular or extra-articular on large field-of-view imaging of the pelvis and lack of intra-articular fluid within the right hip joint. No additional enhancing masses are identified. Incidentally noted 2.1 cm subcutaneous cyst along the anterior lower abdominopelvic wall, left of midline, likely sebaceous cyst. Soft tissues are otherwise within normal limits. No soft tissue edema. No pelvic or inguinal lymphadenopathy. IMPRESSION: 1. Adjacent to the anterior margin of the right femoral neck is a small nodular focal enhancing lesion measuring 1.9 x 1.1 x 1.4 cm. It is difficult determine whether this lesion is intra-articular or extra-articular on large field-of-view imaging of the pelvis and lack of intra-articular fluid within the right hip joint. Differential considerations include a small metastatic lesion versus focal synovitis. 2. No acute osseous findings. No evidence of osseous metastatic disease. 3. Mild bilateral hip osteoarthritis. Electronically Signed   By: Davina Poke D.O.   On: 02/02/2021 11:10   CARDIAC CATHETERIZATION  Result Date: 01/22/2021   Mid LM to Dist LM lesion is 50% stenosed.   Prox RCA to Mid RCA lesion is 25% stenosed.   The left ventricular systolic function is normal.   LV end diastolic pressure is normal.   The left ventricular ejection fraction is  55-65% by visual estimate. Nonobstructive CAD. There is a 50% distal left main stenosis but this is not hemodynamically significant by flow wire assessment. Normal LV function Normal LVEDP Plan: patient is cleared to proceed with evaluation and treatment of his lung mass. Risk factor modification.   DG Chest Port 1 View  Result Date: 02/11/2021 CLINICAL DATA:  Pt c/o chest pain, sternum area, constant pain x 1 day that has since improved, SOB, general weakness that has progressively gotten worse. HX: COPD, lung cancer (left side mass) diagnosed December 2022. EXAM: PORTABLE CHEST - 1 VIEW COMPARISON:  10/25/2020 FINDINGS: Interval improved aeration in left upper lung. Persistent dense consolidation/atelectasis in the left lower lung with possible associated effusion. The right lung remains clear. Heart size difficult to assess due to adjacent opacities. Aortic Atherosclerosis (ICD10-170.0). No pneumothorax. Vertebral endplate spurring at multiple levels in the lower thoracic spine. IMPRESSION: Dense left lower lung consolidation/atelectasis with possible effusion Electronically Signed   By: Lucrezia Europe M.D.   On: 02/11/2021 13:38   ECHOCARDIOGRAM COMPLETE  Result Date: 02/12/2021    ECHOCARDIOGRAM REPORT   Patient Name:   ZYMARION FAVORITE Date of Exam: 02/12/2021 Medical Rec #:  [id number]         Height:       73.0 in Accession #:    [id number]  Weight:       201.2 lb Date of Birth:  September 18, 1945          BSA:          2.157 m Patient Age:    41 years          BP:           139/61 mmHg Patient Gender: M                 HR:           108 bpm. Exam Location:  Inpatient Procedure: 2D Echo, Cardiac Doppler and Color Doppler Indications:    Aflutter  History:        Patient has prior history of Echocardiogram examinations, most                 recent 06/06/2020. COPD, Arrythmias:Tachycardia,                 Signs/Symptoms:Chest Pain; Risk Factors:Hypertension and                 Diabetes.  Sonographer:     Glo Herring Referring Phys: [id number] [place]  1. Left ventricular ejection fraction, by estimation, is 55 to 60%. The left ventricle has normal function. The left ventricle has no regional wall motion abnormalities. There is mild concentric left ventricular hypertrophy. Left ventricular diastolic function could not be evaluated.  2. Right ventricular systolic function is normal. The right ventricular size is normal. There is normal pulmonary artery systolic pressure.  3. Left atrial size was mild to moderately dilated.  4. Right atrial size was mild to moderately dilated.  5. A small pericardial effusion is present. There is no evidence of cardiac tamponade.  6. The mitral valve is normal in structure. No evidence of mitral valve regurgitation. No evidence of mitral stenosis.  7. The aortic valve is grossly normal. Aortic valve regurgitation is not visualized. No aortic stenosis is present.  8. There is mild dilatation of the ascending aorta, measuring 40 mm.  9. The inferior vena cava is dilated in size with >50% respiratory variability, suggesting right atrial pressure of 8 mmHg. Comparison(s): Changes from prior study are noted. Now in atrial flutter. Conclusion(s)/Recommendation(s): Otherwise normal echocardiogram, with minor abnormalities described in the report. In atrial flutter throughout the study. FINDINGS  Left Ventricle: Left ventricular ejection fraction, by estimation, is 55 to 60%. The left ventricle has normal function. The left ventricle has no regional wall motion abnormalities. The left ventricular internal cavity size was normal in size. There is  mild concentric left ventricular hypertrophy. Left ventricular diastolic function could not be evaluated due to nondiagnostic images. Left ventricular diastolic function could not be evaluated. Right Ventricle: The right ventricular size is normal. No increase in right ventricular wall thickness. Right ventricular systolic  function is normal. There is normal pulmonary artery systolic pressure. The tricuspid regurgitant velocity is 2.45 m/s, and  with an assumed right atrial pressure of 8 mmHg, the estimated right ventricular systolic pressure is 00.9 mmHg. Left Atrium: Left atrial size was mild to moderately dilated. Right Atrium: Right atrial size was mild to moderately dilated. Pericardium: A small pericardial effusion is present. There is no evidence of cardiac tamponade. Mitral Valve: The mitral valve is normal in structure. No evidence of mitral valve regurgitation. No evidence of mitral valve stenosis. Tricuspid Valve: The tricuspid valve is normal in structure. Tricuspid valve regurgitation is trivial. No evidence of tricuspid stenosis. Aortic  Valve: The aortic valve is grossly normal. Aortic valve regurgitation is not visualized. No aortic stenosis is present. Aortic valve mean gradient measures 4.0 mmHg. Aortic valve peak gradient measures 8.0 mmHg. Aortic valve area, by VTI measures 2.87 cm. Pulmonic Valve: The pulmonic valve was not well visualized. Pulmonic valve regurgitation is not visualized. Aorta: The aortic root, ascending aorta, aortic arch and descending aorta are all structurally normal, with no evidence of dilitation or obstruction. There is mild dilatation of the ascending aorta, measuring 40 mm. Venous: The inferior vena cava is dilated in size with greater than 50% respiratory variability, suggesting right atrial pressure of 8 mmHg. IAS/Shunts: The atrial septum is grossly normal.  LEFT VENTRICLE PLAX 2D LVIDd:         4.70 cm      Diastology LVIDs:         3.20 cm      LV e' medial:    8.49 cm/s LV PW:         1.20 cm      LV E/e' medial:  9.8 LV IVS:        1.20 cm      LV e' lateral:   12.13 cm/s LVOT diam:     2.00 cm      LV E/e' lateral: 6.9 LV SV:         72 LV SV Index:   33 LVOT Area:     3.14 cm  LV Volumes (MOD) LV vol d, MOD A2C: 105.0 ml LV vol d, MOD A4C: 93.3 ml LV vol s, MOD A2C: 46.5 ml LV  vol s, MOD A4C: 42.4 ml LV SV MOD A2C:     58.5 ml LV SV MOD A4C:     93.3 ml LV SV MOD BP:      56.7 ml RIGHT VENTRICLE             IVC RV Basal diam:  4.30 cm     IVC diam: 2.50 cm RV S prime:     14.60 cm/s LEFT ATRIUM             Index        RIGHT ATRIUM           Index LA diam:        4.60 cm 2.13 cm/m   RA Area:     28.00 cm LA Vol (A2C):   86.3 ml 40.01 ml/m  RA Volume:   93.50 ml  43.34 ml/m LA Vol (A4C):   56.7 ml 26.29 ml/m LA Biplane Vol: 70.6 ml 32.73 ml/m  AORTIC VALVE                    PULMONIC VALVE AV Area (Vmax):    2.87 cm     PV Vmax:       0.99 m/s AV Area (Vmean):   2.69 cm     PV Peak grad:  3.9 mmHg AV Area (VTI):     2.87 cm AV Vmax:           141.67 cm/s AV Vmean:          94.533 cm/s AV VTI:            0.249 m AV Peak Grad:      8.0 mmHg AV Mean Grad:      4.0 mmHg LVOT Vmax:         129.33 cm/s LVOT Vmean:  80.867 cm/s LVOT VTI:          0.228 m LVOT/AV VTI ratio: 0.91  AORTA Ao Root diam: 4.00 cm Ao Asc diam:  3.40 cm MITRAL VALVE               TRICUSPID VALVE MV Area (PHT): 5.08 cm    TR Peak grad:   24.0 mmHg MV Decel Time: 149 msec    TR Vmax:        245.00 cm/s MV E velocity: 83.13 cm/s                            SHUNTS                            Systemic VTI:  0.23 m                            Systemic Diam: 2.00 cm Buford Dresser MD Electronically signed by Buford Dresser MD Signature Date/Time: 02/12/2021/11:39:35 AM    Final    US THYROID  Result Date: 02/15/2021 CLINICAL DATA:  Abnormal TSH, COVID EXAM: THYROID ULTRASOUND TECHNIQUE: Ultrasound examination of the thyroid gland and adjacent soft tissues was performed. COMPARISON:  02/12/2010 and previous FINDINGS: Parenchymal Echotexture: Moderately heterogenous Isthmus: 0.2 cm thickness, previously 0.3 Right lobe: 4.7 x 2.4 x 1.9 cm, previously 5.6 x 2.7 x 2.6 Left lobe: 4.5 x 2.7 x 2.5 cm, previously 5.1 x 2.1 x 2.7 _________________________________________________________ Estimated total  number of nodules >/= 1 cm: 3 Number of spongiform nodules >/=  2 cm not described below (TR1): 0 Number of mixed cystic and solid nodules >/= 1.5 cm not described below ([place]): 0 _________________________________________________________ Nodule 1: 1.5 cm complex cyst, mid right. This was previously measured as 3 separate lesions on previous study of 02/15/2009 but in retrospect is unchanged. Stability for greater than 5 years implies benignity. This nodule does NOT meet TI-RADS criteria for biopsy or dedicated follow-up. Nodule 2: 0.7 cm benign cyst, inferolateral right. Nodule 3: 1 cm hyperechoic nodule, stable since previous. Stability for greater than 5 years implies benignity. This nodule does NOT meet TI-RADS criteria for biopsy or dedicated follow-up. Nodule # 4: Prior biopsy: No Location: Left; inferior Maximum size: 2.1 cm; Other 2 dimensions: 1.7 x 1.8 cm, previously, 1.1 x 0.9 x 0.9 cm Composition: solid/almost completely solid (2) Echogenicity: hypoechoic (2) Shape: not taller-than-wide (0) Margins: ill-defined (0) Echogenic foci: none (0) ACR TI-RADS total points: 4. ACR TI-RADS risk category:  TR 4. Significant change in size (>/= 20% in two dimensions and minimal increase of 2 mm): Yes Change in features: No Change in ACR TI-RADS risk category: No ACR TI-RADS recommendations: **Given size (>/= 1.5 cm) and appearance, fine needle aspiration of this moderately suspicious nodule should be considered based on TI-RADS criteria. Nodule 5: 0.7 cm complex cyst, mid left; This nodule does NOT meet TI-RADS criteria for biopsy or dedicated follow-up. _________________________________________________________ No regional cervical adenopathy identified. IMPRESSION: 1. Borderline thyromegaly with bilateral nodules. 2. Recommend FNA biopsy of enlarging 2.1 cm moderately suspicious inferior left nodule. The above is in keeping with the ACR TI-RADS recommendations - J Am Coll Radiol 2017;14:587-595. Electronically Signed    By: Lucrezia Europe M.D.   On: 02/15/2021 11:01     Time coordinating discharge: Over 30 minutes    Dwyane Dee, MD  [facility] 02/16/2021, 3:31 PM

## 2021-02-17 DIAGNOSIS — C3490 Malignant neoplasm of unspecified part of unspecified bronchus or lung: Secondary | ICD-10-CM | POA: Diagnosis not present

## 2021-02-18 ENCOUNTER — Encounter (HOSPITAL_COMMUNITY): Payer: Self-pay | Admitting: Emergency Medicine

## 2021-02-18 ENCOUNTER — Other Ambulatory Visit: Payer: Self-pay

## 2021-02-18 ENCOUNTER — Emergency Department (HOSPITAL_COMMUNITY)
Admission: EM | Admit: 2021-02-18 | Discharge: 2021-02-18 | Disposition: A | Discharge status: Home / Self Care | Payer: Medicare Other | Attending: Emergency Medicine | Admitting: Emergency Medicine

## 2021-02-18 ENCOUNTER — Emergency Department (HOSPITAL_COMMUNITY): Payer: Medicare Other

## 2021-02-18 DIAGNOSIS — Z8616 Personal history of COVID-19: Secondary | ICD-10-CM | POA: Diagnosis not present

## 2021-02-18 DIAGNOSIS — R5383 Other fatigue: Secondary | ICD-10-CM | POA: Insufficient documentation

## 2021-02-18 DIAGNOSIS — C3432 Malignant neoplasm of lower lobe, left bronchus or lung: Secondary | ICD-10-CM | POA: Diagnosis not present

## 2021-02-18 DIAGNOSIS — Z7984 Long term (current) use of oral hypoglycemic drugs: Secondary | ICD-10-CM | POA: Insufficient documentation

## 2021-02-18 DIAGNOSIS — Z7951 Long term (current) use of inhaled steroids: Secondary | ICD-10-CM | POA: Diagnosis not present

## 2021-02-18 DIAGNOSIS — G4700 Insomnia, unspecified: Secondary | ICD-10-CM | POA: Diagnosis not present

## 2021-02-18 DIAGNOSIS — K5790 Diverticulosis of intestine, part unspecified, without perforation or abscess without bleeding: Secondary | ICD-10-CM | POA: Diagnosis not present

## 2021-02-18 DIAGNOSIS — Z85118 Personal history of other malignant neoplasm of bronchus and lung: Secondary | ICD-10-CM | POA: Diagnosis not present

## 2021-02-18 DIAGNOSIS — E161 Other hypoglycemia: Secondary | ICD-10-CM | POA: Diagnosis not present

## 2021-02-18 DIAGNOSIS — Z20822 Contact with and (suspected) exposure to covid-19: Secondary | ICD-10-CM | POA: Insufficient documentation

## 2021-02-18 DIAGNOSIS — J189 Pneumonia, unspecified organism: Secondary | ICD-10-CM | POA: Diagnosis not present

## 2021-02-18 DIAGNOSIS — E041 Nontoxic single thyroid nodule: Secondary | ICD-10-CM | POA: Diagnosis not present

## 2021-02-18 DIAGNOSIS — J449 Chronic obstructive pulmonary disease, unspecified: Secondary | ICD-10-CM | POA: Diagnosis not present

## 2021-02-18 DIAGNOSIS — Z79899 Other long term (current) drug therapy: Secondary | ICD-10-CM | POA: Diagnosis not present

## 2021-02-18 DIAGNOSIS — J439 Emphysema, unspecified: Secondary | ICD-10-CM | POA: Diagnosis not present

## 2021-02-18 DIAGNOSIS — Z87891 Personal history of nicotine dependence: Secondary | ICD-10-CM | POA: Insufficient documentation

## 2021-02-18 DIAGNOSIS — I4892 Unspecified atrial flutter: Secondary | ICD-10-CM | POA: Diagnosis not present

## 2021-02-18 DIAGNOSIS — E1169 Type 2 diabetes mellitus with other specified complication: Secondary | ICD-10-CM | POA: Diagnosis not present

## 2021-02-18 DIAGNOSIS — H409 Unspecified glaucoma: Secondary | ICD-10-CM | POA: Diagnosis not present

## 2021-02-18 DIAGNOSIS — E162 Hypoglycemia, unspecified: Secondary | ICD-10-CM | POA: Diagnosis not present

## 2021-02-18 DIAGNOSIS — Z9181 History of falling: Secondary | ICD-10-CM | POA: Diagnosis not present

## 2021-02-18 DIAGNOSIS — R531 Weakness: Secondary | ICD-10-CM | POA: Diagnosis not present

## 2021-02-18 DIAGNOSIS — D509 Iron deficiency anemia, unspecified: Secondary | ICD-10-CM | POA: Diagnosis not present

## 2021-02-18 DIAGNOSIS — G20 Parkinson's disease: Secondary | ICD-10-CM | POA: Diagnosis not present

## 2021-02-18 DIAGNOSIS — R296 Repeated falls: Secondary | ICD-10-CM | POA: Diagnosis not present

## 2021-02-18 DIAGNOSIS — J90 Pleural effusion, not elsewhere classified: Secondary | ICD-10-CM | POA: Diagnosis not present

## 2021-02-18 DIAGNOSIS — I10 Essential (primary) hypertension: Secondary | ICD-10-CM | POA: Diagnosis not present

## 2021-02-18 DIAGNOSIS — J9621 Acute and chronic respiratory failure with hypoxia: Secondary | ICD-10-CM | POA: Diagnosis not present

## 2021-02-18 DIAGNOSIS — U071 COVID-19: Secondary | ICD-10-CM | POA: Diagnosis not present

## 2021-02-18 DIAGNOSIS — M1990 Unspecified osteoarthritis, unspecified site: Secondary | ICD-10-CM | POA: Diagnosis not present

## 2021-02-18 DIAGNOSIS — E441 Mild protein-calorie malnutrition: Secondary | ICD-10-CM | POA: Diagnosis not present

## 2021-02-18 DIAGNOSIS — F32A Depression, unspecified: Secondary | ICD-10-CM | POA: Diagnosis not present

## 2021-02-18 DIAGNOSIS — Z7952 Long term (current) use of systemic steroids: Secondary | ICD-10-CM | POA: Diagnosis not present

## 2021-02-18 DIAGNOSIS — Z7901 Long term (current) use of anticoagulants: Secondary | ICD-10-CM | POA: Insufficient documentation

## 2021-02-18 DIAGNOSIS — K648 Other hemorrhoids: Secondary | ICD-10-CM | POA: Diagnosis not present

## 2021-02-18 DIAGNOSIS — Z743 Need for continuous supervision: Secondary | ICD-10-CM | POA: Diagnosis not present

## 2021-02-18 DIAGNOSIS — E785 Hyperlipidemia, unspecified: Secondary | ICD-10-CM | POA: Insufficient documentation

## 2021-02-18 DIAGNOSIS — H5462 Unqualified visual loss, left eye, normal vision right eye: Secondary | ICD-10-CM | POA: Diagnosis not present

## 2021-02-18 DIAGNOSIS — R000 Tachycardia, unspecified: Secondary | ICD-10-CM | POA: Diagnosis not present

## 2021-02-18 DIAGNOSIS — E119 Type 2 diabetes mellitus without complications: Secondary | ICD-10-CM | POA: Diagnosis not present

## 2021-02-18 DIAGNOSIS — I959 Hypotension, unspecified: Secondary | ICD-10-CM | POA: Diagnosis not present

## 2021-02-18 LAB — TSH: TSH: 1.111 u[IU]/mL (ref 0.350–4.500)

## 2021-02-18 LAB — CBC WITH DIFFERENTIAL/PLATELET
Abs Immature Granulocytes: 0.14 10*3/uL — ABNORMAL HIGH (ref 0.00–0.07)
Basophils Absolute: 0 10*3/uL (ref 0.0–0.1)
Basophils Relative: 0 %
Eosinophils Absolute: 0.2 10*3/uL (ref 0.0–0.5)
Eosinophils Relative: 1 %
HCT: 33.8 % — ABNORMAL LOW (ref 39.0–52.0)
Hemoglobin: 9.1 g/dL — ABNORMAL LOW (ref 13.0–17.0)
Immature Granulocytes: 1 %
Lymphocytes Relative: 5 %
Lymphs Abs: 0.7 10*3/uL (ref 0.7–4.0)
MCH: 19.4 pg — ABNORMAL LOW (ref 26.0–34.0)
MCHC: 26.9 g/dL — ABNORMAL LOW (ref 30.0–36.0)
MCV: 71.9 fL — ABNORMAL LOW (ref 80.0–100.0)
Monocytes Absolute: 1.1 10*3/uL — ABNORMAL HIGH (ref 0.1–1.0)
Monocytes Relative: 7 %
Neutro Abs: 12.2 10*3/uL — ABNORMAL HIGH (ref 1.7–7.7)
Neutrophils Relative %: 86 %
Platelets: 469 10*3/uL — ABNORMAL HIGH (ref 150–400)
RBC: 4.7 MIL/uL (ref 4.22–5.81)
RDW: 19.1 % — ABNORMAL HIGH (ref 11.5–15.5)
WBC: 14.3 10*3/uL — ABNORMAL HIGH (ref 4.0–10.5)
nRBC: 0 % (ref 0.0–0.2)

## 2021-02-18 LAB — BLOOD GAS, VENOUS
Acid-Base Excess: 3.6 mmol/L — ABNORMAL HIGH (ref 0.0–2.0)
Bicarbonate: 28.7 mmol/L — ABNORMAL HIGH (ref 20.0–28.0)
O2 Saturation: 98.3 %
Patient temperature: 98.6
pCO2, Ven: 49.2 mmHg (ref 44.0–60.0)
pH, Ven: 7.384 (ref 7.250–7.430)
pO2, Ven: 124 mmHg — ABNORMAL HIGH (ref 32.0–45.0)

## 2021-02-18 LAB — COMPREHENSIVE METABOLIC PANEL
ALT: <5 U/L (ref 0–44)
AST: 10 U/L — ABNORMAL LOW (ref 15–41)
Albumin: 2.9 g/dL — ABNORMAL LOW (ref 3.5–5.0)
Alkaline Phosphatase: 69 U/L (ref 38–126)
Anion gap: 8 (ref 5–15)
BUN: 27 mg/dL — ABNORMAL HIGH (ref 8–23)
CO2: 27 mmol/L (ref 22–32)
Calcium: 9 mg/dL (ref 8.9–10.3)
Chloride: 102 mmol/L (ref 98–111)
Creatinine, Ser: 0.9 mg/dL (ref 0.61–1.24)
GFR, Estimated: >60 mL/min (ref >60)
Glucose, Bld: 89 mg/dL (ref 70–99)
Potassium: 3.8 mmol/L (ref 3.5–5.1)
Sodium: 137 mmol/L (ref 135–145)
Total Bilirubin: 0.7 mg/dL (ref 0.3–1.2)
Total Protein: 6.2 g/dL — ABNORMAL LOW (ref 6.5–8.1)

## 2021-02-18 LAB — RESP PANEL BY RT-PCR (FLU A&B, COVID) ARPGX2
Influenza A by PCR: NEGATIVE
Influenza B by PCR: NEGATIVE
SARS Coronavirus 2 by RT PCR: NEGATIVE

## 2021-02-18 LAB — GLUCOSE, CAPILLARY: Glucose-Capillary: 159 mg/dL — ABNORMAL HIGH (ref 70–99)

## 2021-02-18 LAB — TROPONIN I (HIGH SENSITIVITY)
Troponin I (High Sensitivity): 8 ng/L (ref <18)
Troponin I (High Sensitivity): 6 ng/L (ref <18)

## 2021-02-18 LAB — BRAIN NATRIURETIC PEPTIDE: B Natriuretic Peptide: 91.6 pg/mL (ref 0.0–100.0)

## 2021-02-18 IMAGING — CR DG CHEST 2V
4 series · 4 of 4 positions shown · non-contrast
Comparison: [DATE]

CLINICAL DATA: Weakness

EXAM:
CHEST - 2 VIEW

[w chest lat (1 of 2)]
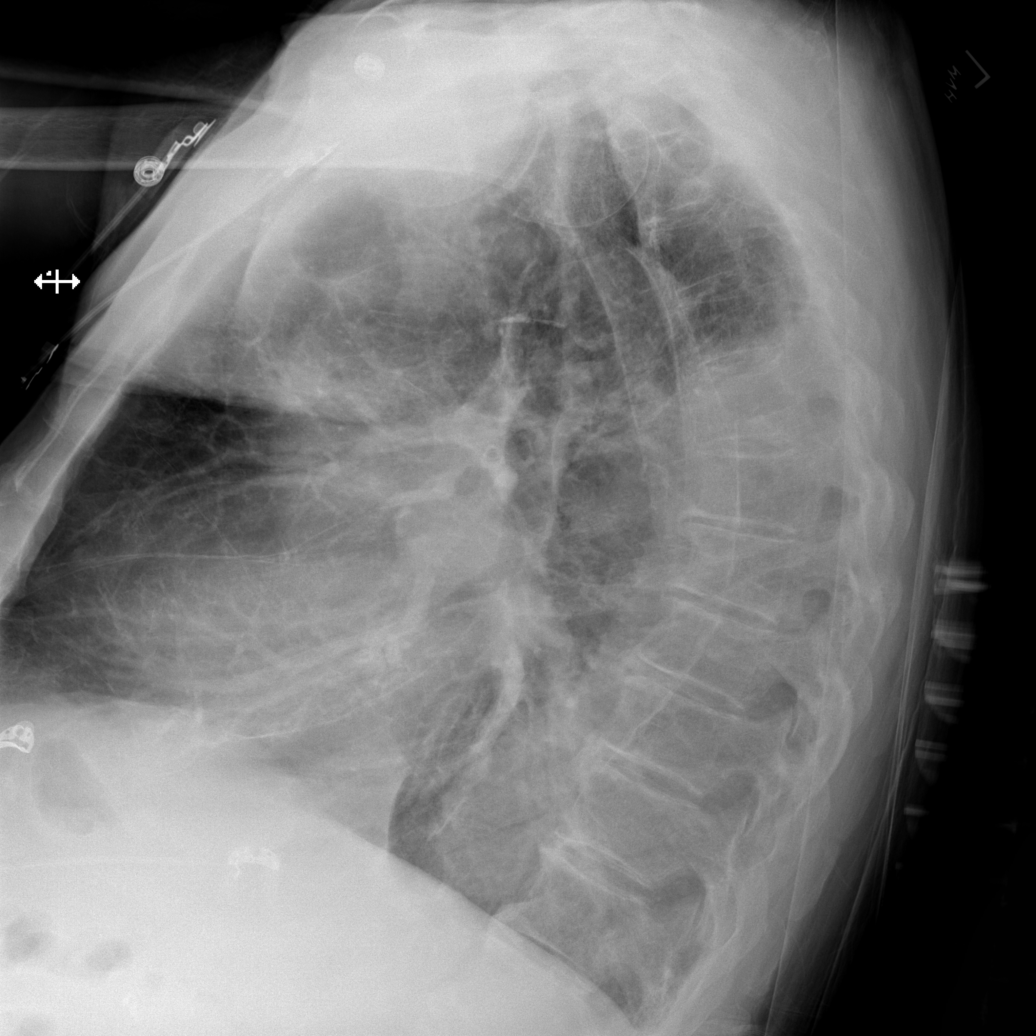

[w chest lat (2 of 2)]
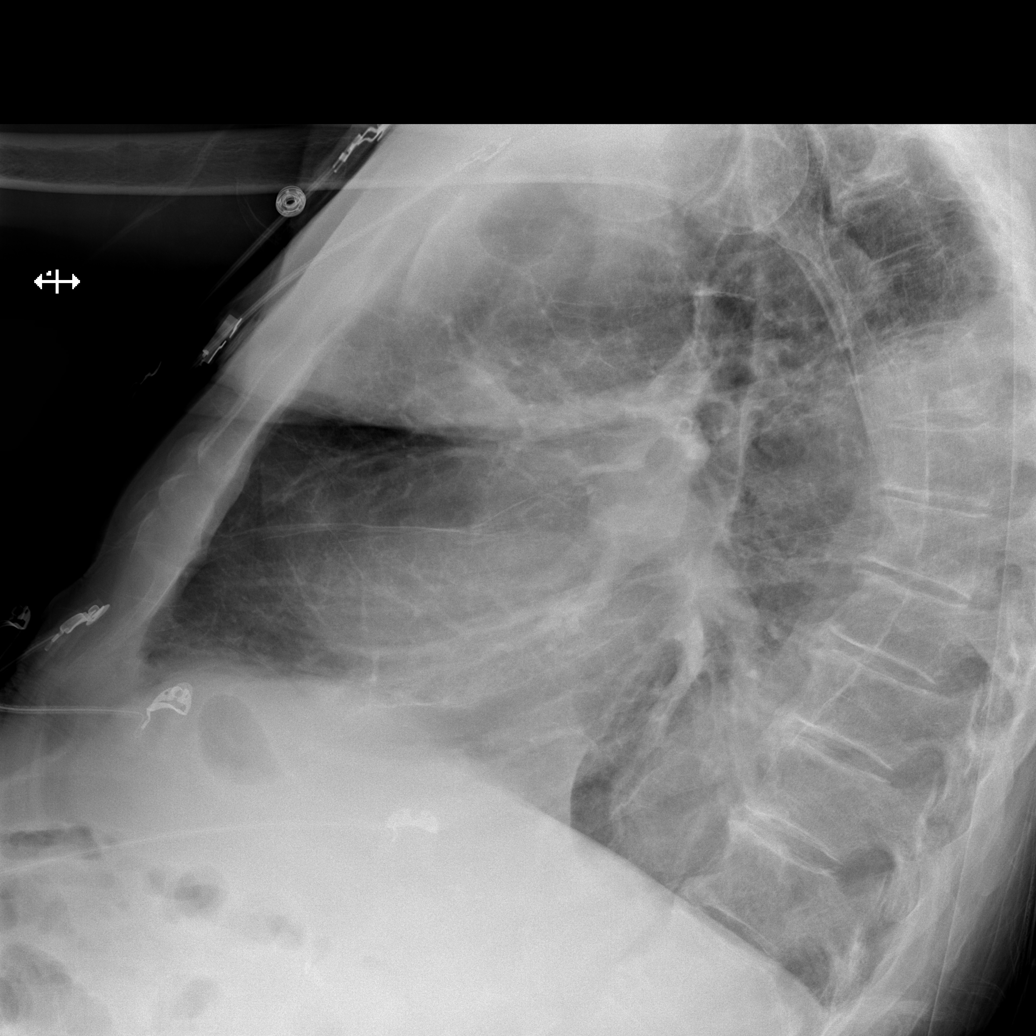

[x chest ap (1 of 2)]
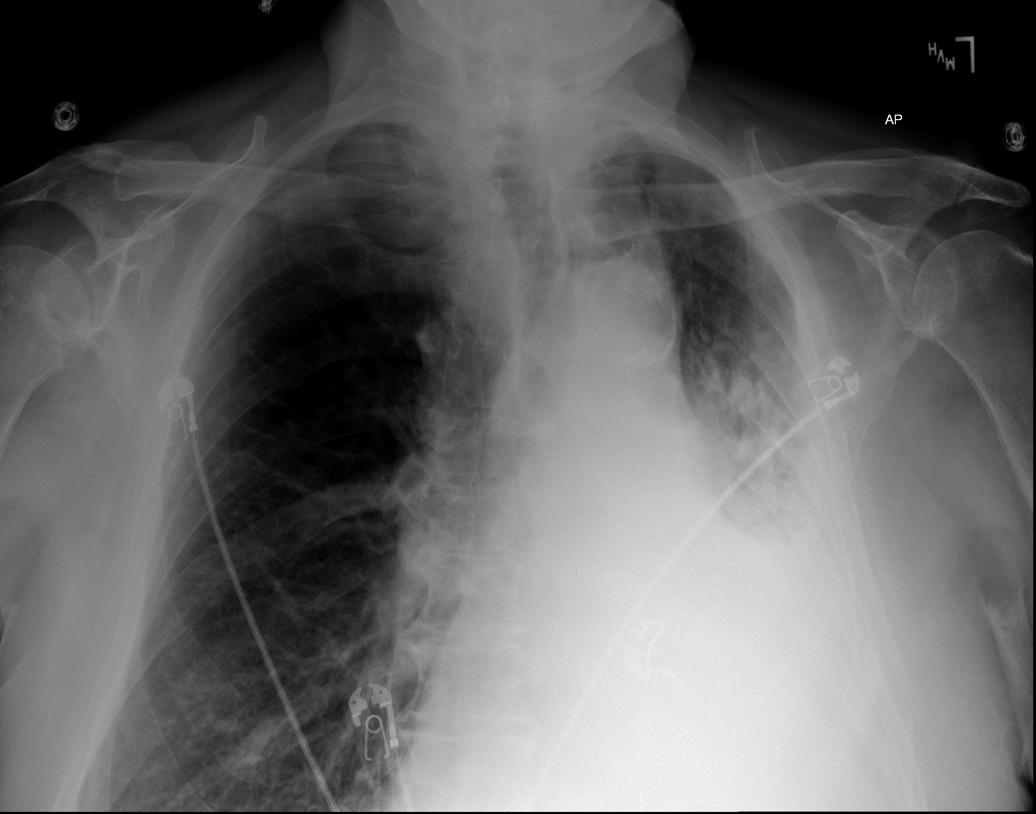

[x chest ap (2 of 2)]
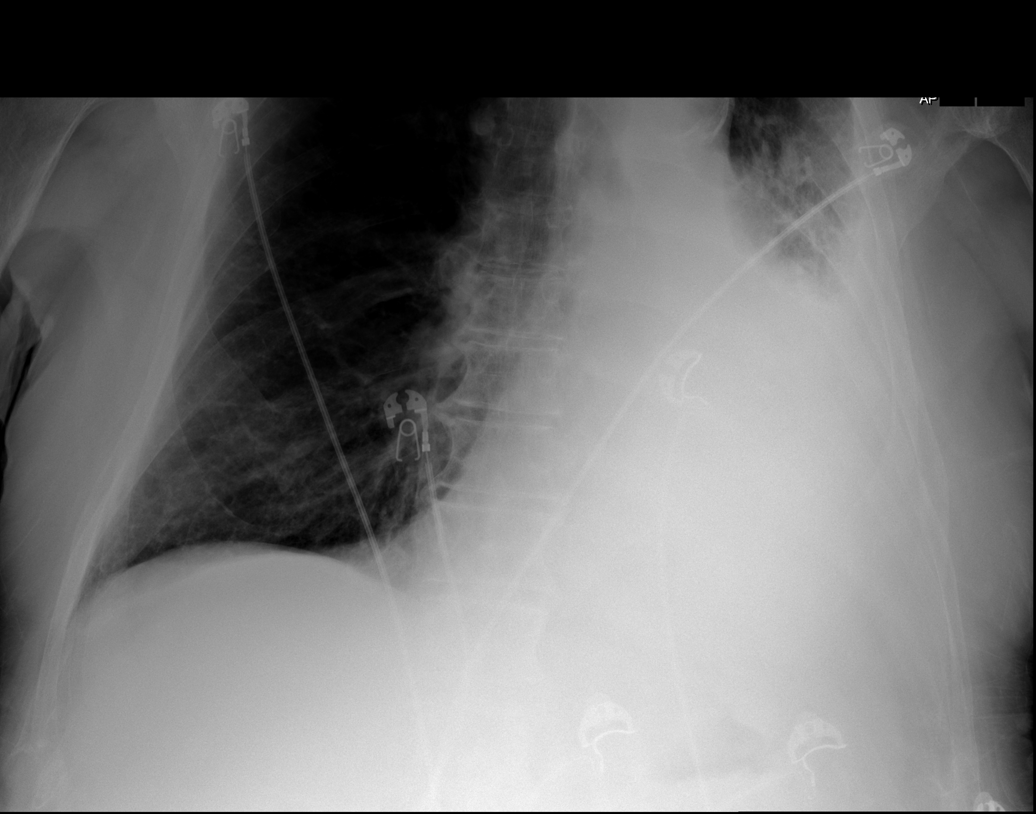

[4 of 4 positions shown; findings below may reference images not displayed]

FINDINGS: Left pleural effusion and extensive airspace disease throughout the
left lung with volume loss. Aeration opacities worsening since prior
study. Right lung clear. Heart is likely mildly enlarged. Aortic
atherosclerosis.
IMPRESSION: Volume loss with left effusion and left lung airspace disease,
worsened since prior study. Findings concerning for pneumonia.

## 2021-02-18 MED ORDER — AMOXICILLIN-POT CLAVULANATE 875-125 MG PO TABS
1.0000 | ORAL_TABLET | Freq: Once | ORAL | Status: AC
  Administered 2021-02-18: 22:00:00 1 via ORAL
  Filled 2021-02-18: qty 1

## 2021-02-18 MED ORDER — AMOXICILLIN-POT CLAVULANATE 875-125 MG PO TABS: 1.0000 | ORAL_TABLET | Freq: Two times a day (BID) | ORAL | 0 refills | Status: DC

## 2021-02-18 MED ORDER — AZITHROMYCIN 250 MG PO TABS: 250.0000 mg | ORAL_TABLET | Freq: Every day | ORAL | 0 refills | Status: DC

## 2021-02-18 NOTE — ED Provider Notes (Signed)
[facility] DEPT Provider Note   CSN: [id number] Arrival date & time: 02/18/21  1651     History Chief Complaint  Patient presents with   Weakness    James [name] is a 75 y.o. male.  This is a 75 y.o. male with significant medical history as below, including recently diagnosed atrial fibrillation on Cardizem/Eliquis, endobronchial cancer undergoing radiation treatment Dr. Earlie Server (scheduled start chemotherapy 12/28) who presents to the ED with complaint of weakness.  Patient was discharged in the hospital 2 days ago secondary to new onset atrial fibrillation/flutter.  Patient was stabilized discharged home.  He has reported that he has been having some ongoing weakness over the past month.  Weakness is generalized and he is appreciating it and when he is standing attempting to ambulate.  He has ongoing chest pain over the past 2 to 3 months.  Feels like it begins in his shoulder blades and progresses to his sternum.  Pain is constant, not associate with exertion or deep inspiration.  He has been taking all his medications as prescribed, no recent falls.  No fevers, chills, nausea, vomiting, dyspnea.  Lives with wife.   D/w family [place], [place] [phone]     The history is provided by the patient. No language interpreter was used.  Weakness Associated symptoms: chest pain   Associated symptoms: no abdominal pain, no cough, no fever, no headaches, no nausea, no shortness of breath and no vomiting       Past Medical History:  Diagnosis Date   Allergy    seasonal   Blind left eye    legally   Blood transfusion    2002   COPD (chronic obstructive pulmonary disease) (HCC)    Depression    Diabetes mellitus, type 2 (HCC)    Diverticulosis    Dyspnea    ED (erectile dysfunction)    Glaucoma    Hyperlipidemia    Hypertension    Insomnia    Internal and external bleeding hemorrhoids    Iron deficiency anemia    Osteoarthritis     Parkinson's disease ([place]) 2014   Dr. [name] PCP  and Dr. [name] at [facility]   Rosacea    Tremor of both hands    Tubular adenoma of colon 03/28/2011    Patient Active Problem List   Diagnosis Date Noted   Abnormal TSH 02/14/2021   Acute on chronic respiratory failure with hypoxia ([place]) 02/13/2021   Tachycardia, unspecified 02/11/2021   New onset atrial flutter ([place]) 02/11/2021   COVID-19 virus infection 02/11/2021   Hypertension    Hyperlipidemia    Diabetes mellitus, type 2 ([place])    Depression    Mild protein malnutrition ([place])    Chest pain 02/06/2021   Encounter for antineoplastic chemotherapy 02/01/2021   Endobronchial cancer, left ([place])    Chest pain, precordial 01/22/2021   AVM (arteriovenous malformation) of small bowel, 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   Iron deficiency anemia    Heme positive stool    History of colonic polyps    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/14/2013   Upper airway cough syndrome 04/14/2013   Hypoxemia 06/08/2012   Parkinson's disease ([place]) 2014   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    Past Surgical History:  Procedure Laterality Date   ARTHROSCOPIC REPAIR ACL     BALLOON DILATION  01/28/2021   Procedure: BALLOON DILATION;  Surgeon: [name], DO;  Location: [facility];  Service: Pulmonary;;  BRONCHIAL BIOPSY  01/28/2021   Procedure: BRONCHIAL BIOPSIES;  Surgeon: [name], DO;  Location: [facility];  Service: Pulmonary;;   [place]   right   COLONOSCOPY WITH PROPOFOL N/A 07/25/2015   Procedure: COLONOSCOPY WITH PROPOFOL;  Surgeon: [name], MD;  Location: [facility];  Service: Endoscopy;  Laterality: N/A;   CRYOTHERAPY  01/28/2021   Procedure: CRYOTHERAPY;  Surgeon: [name], DO;  Location: [facility]OSCOPY;  Service: Pulmonary;;   ESOPHAGOGASTRODUODENOSCOPY (EGD) WITH PROPOFOL N/A 07/25/2015   Procedure: ESOPHAGOGASTRODUODENOSCOPY (EGD) WITH PROPOFOL;  Surgeon: [name], MD;  Location: [facility];  Service: Endoscopy;  Laterality: N/A;   HEMOSTASIS CONTROL  01/28/2021   Procedure: HEMOSTASIS CONTROL;  Surgeon: [name], DO;  Location: [facility];  Service: Pulmonary;;   INTRAVASCULAR PRESSURE WIRE/FFR STUDY N/A 01/22/2021   Procedure: INTRAVASCULAR PRESSURE WIRE/FFR STUDY;  Surgeon: [name], Peter M, MD;  Location: [facility] CV LAB;  Service: Cardiovascular;  Laterality: N/A;   IR THORACENTESIS ASP PLEURAL SPACE W/IMG GUIDE  09/17/2020   IR THORACENTESIS ASP PLEURAL SPACE W/IMG GUIDE  10/25/2020   KNEE ARTHROSCOPY  1996   LEFT HEART CATH AND CORONARY ANGIOGRAPHY N/A 01/22/2021   Procedure: LEFT HEART CATH AND CORONARY ANGIOGRAPHY;  Surgeon: [name], Peter M, MD;  Location: [facility];  Service: Cardiovascular;  Laterality: N/A;   PARTIAL COLECTOMY  2008   THYROID CYST EXCISION     TONSILLECTOMY     VIDEO BRONCHOSCOPY Left 01/28/2021   Procedure: VIDEO BRONCHOSCOPY WITHOUT FLUORO;  Surgeon: [name], DO;  Location: [facility];  Service: Pulmonary;  Laterality: Left;       Family History  Problem Relation Age of Onset   CAD Father    Alcohol abuse Father    Heart disease Father    COPD Mother    Asthma Mother    Emphysema Mother    Cancer Maternal Grandmother    Colon cancer Neg Hx     Social History   Tobacco Use   Smoking status: Former    Packs/day: 1.50    Years: 50.00    Pack years: 75.00    Types: Cigarettes    Quit date: 04/30/2015    Years since quitting: 5.8   Smokeless tobacco: Never   Tobacco comments:    States he sometimes sneaks a cigarette  Vaping Use   Vaping Use: Never used  Substance Use Topics   Alcohol use: Not Currently    Comment: once yearly   Drug use: No    Home Medications Prior to Admission medications   Medication Sig Start Date End Date Taking? Authorizing Provider  amoxicillin-clavulanate (AUGMENTIN) 875-125 MG tablet Take 1 tablet by mouth every 12 (twelve) hours.  02/18/21  Yes Wynona Dove A, DO  azithromycin (ZITHROMAX) 250 MG tablet Take 1 tablet (250 mg total) by mouth daily. Take first 2 tablets together, then 1 every day until finished. 02/18/21  Yes Jeanell Sparrow, DO  apixaban (ELIQUIS) 5 MG TABS tablet Take 1 tablet (5 mg total) by mouth 2 (two) times daily. 02/16/21   Dwyane Dee, MD  atorvastatin (LIPITOR) 20 MG tablet Take 20 mg by mouth at bedtime.  05/29/12   [provider]  buPROPion (WELLBUTRIN XL) 300 MG 24 hr tablet Take 150 mg by mouth 2 (two) times daily. 05/31/12   [provider]  calcium-vitamin D (OSCAL WITH D) 250-125 MG-UNIT per tablet  Take 1 tablet by mouth daily.    [provider]  carbidopa-levodopa (SINEMET IR) 25-100 MG tablet TAKE 1.5 TABLETS BY MOUTH 3 TIMES DAILY. Patient taking differently: Take 1.5 tablets by mouth 3 (three) times daily. 02/05/21   Lomax, Amy, NP  cetirizine (ZYRTEC) 10 MG tablet Take 10 mg by mouth 2 (two) times daily.    [provider]  dexamethasone (DECADRON) 6 MG tablet Take 1 tablet (6 mg total) by mouth daily for 5 days. 02/17/21 02/22/21  Dwyane Dee, MD  diltiazem (CARDIZEM CD) 300 MG 24 hr capsule Take 1 capsule (300 mg total) by mouth daily. 02/17/21   Dwyane Dee, MD  donepezil (ARICEPT) 10 MG tablet Take 1 tablet (10 mg total) by mouth at bedtime. 05/08/20   Star Age, MD  ferrous gluconate (FERGON) 324 MG tablet TAKE 1 TABLET BY MOUTH DAILY WITH BREAKFAST Patient taking differently: 324 mg daily with breakfast. 09/26/19   Nicholas Lose, MD  formoterol (PERFOROMIST) 20 MCG/2ML nebulizer solution Take 2 mLs (20 mcg total) by nebulization 2 (two) times daily. Patient taking differently: Take 20 mcg by nebulization daily as needed (cough). 10/01/20   Chesley Mires, MD  Nyoka Cowden Tea 315 MG CAPS Take 315 mg by mouth daily.    [provider]  ibuprofen (ADVIL) 800 MG tablet Take 1 tablet (800 mg total) by mouth every 8 (eight) hours as needed for  moderate pain. 12/31/20   Chesley Mires, MD  MEGARED OMEGA-3 KRILL OIL PO Take 1 capsule by mouth daily.    [provider]  metFORMIN (GLUCOPHAGE) 500 MG tablet Take 1 tablet (500 mg total) by mouth 2 (two) times daily with a meal. 01/25/21   [name], Peter M, MD  metoprolol tartrate (LOPRESSOR) 25 MG tablet Take 0.5 tablets (12.5 mg total) by mouth 2 (two) times daily. Hold if heart rate less than 60 or blood pressure less than 100/60 02/16/21   Dwyane Dee, MD  mirtazapine (REMERON) 15 MG tablet Take 15 mg by mouth at bedtime. 06/04/20   [provider]  modafinil (PROVIGIL) 100 MG tablet Take 1 tablet (100 mg total) by mouth daily. 01/10/21   Chesley Mires, MD  Multiple Vitamins-Minerals (MULTIVITAMIN WITH MINERALS) tablet Take 1 tablet by mouth daily. Centrum    [provider]  nitroGLYCERIN (NITROSTAT) 0.4 MG SL tablet Place 1 tablet (0.4 mg total) under the tongue every 5 (five) minutes as needed for chest pain. 10/24/20 02/11/21  [name], MD  omeprazole (PRILOSEC OTC) 20 MG tablet Take 20 mg by mouth every evening.    [provider]  OXYGEN Inhale 4-6 L into the lungs continuous.    [provider]  potassium chloride SA (KLOR-CON M) 20 MEQ tablet Take 1 tablet (20 mEq total) by mouth 2 (two) times daily. 02/01/21   Curt Bears, MD  prochlorperazine (COMPAZINE) 10 MG tablet Take 1 tablet (10 mg total) by mouth every 6 (six) hours as needed for nausea or vomiting. 02/01/21   Curt Bears, MD  revefenacin [facility]) 175 MCG/3ML nebulizer solution Take 3 mLs (175 mcg total) by nebulization daily. 09/14/20   Chesley Mires, MD  sertraline (ZOLOFT) 100 MG tablet Take 150 mg by mouth daily. 06/07/12   [provider]  traMADol (ULTRAM) 50 MG tablet Take 1 tablet (50 mg total) by mouth every 6 (six) hours as needed. Patient taking differently: Take 50 mg by mouth every 6 (six) hours as needed for moderate pain. 02/01/21   Mohamed,  Mohamed, MD    Allergies    Aspirin, Codeine, and Morphine  Review of Systems   Review of Systems  Constitutional:  Positive for fatigue. Negative for chills and fever.  HENT:  Negative for facial swelling and trouble swallowing.   Eyes:  Negative for photophobia and visual disturbance.  Respiratory:  Negative for cough and shortness of breath.   Cardiovascular:  Positive for chest pain. Negative for palpitations.  Gastrointestinal:  Negative for abdominal pain, nausea and vomiting.  Endocrine: Negative for polydipsia and polyuria.  Genitourinary:  Negative for difficulty urinating and hematuria.  Musculoskeletal:  Negative for gait problem and joint swelling.  Skin:  Negative for pallor and rash.  Neurological:  Positive for weakness. Negative for syncope and headaches.  Psychiatric/Behavioral:  Negative for agitation and confusion.    Physical Exam Updated Vital Signs BP 105/69    Pulse 70    Temp (!) 97.4 F (36.3 C)    Resp 19    Ht 6\' 1"  (1.854 m)    Wt 91 kg    SpO2 97%    BMI 26.47 kg/m   Physical Exam  ED Results / Procedures / Treatments   Labs (all labs ordered are listed, but only abnormal results are displayed) Labs Reviewed  COMPREHENSIVE METABOLIC PANEL - Abnormal; Notable for the following components:      Result Value   BUN 27 (*)    Total Protein 6.2 (*)    Albumin 2.9 (*)    AST 10 (*)    All other components within normal limits  CBC WITH DIFFERENTIAL/PLATELET - Abnormal; Notable for the following components:   WBC 14.3 (*)    Hemoglobin 9.1 (*)    HCT 33.8 (*)    MCV 71.9 (*)    MCH 19.4 (*)    MCHC 26.9 (*)    RDW 19.1 (*)    Platelets 469 (*)    Neutro Abs 12.2 (*)    Monocytes Absolute 1.1 (*)    Abs Immature Granulocytes 0.14 (*)    All other components within normal limits  T4, FREE - Abnormal; Notable for the following components:   Free T4 1.29 (*)    All other components within normal limits  BLOOD GAS, VENOUS - Abnormal; Notable for  the following components:   pO2, Ven 124.0 (*)    Bicarbonate 28.7 (*)    Acid-Base Excess 3.6 (*)    All other components within normal limits  RESP PANEL BY RT-PCR (FLU A&B, COVID) ARPGX2  BRAIN NATRIURETIC PEPTIDE  TSH  OSMOLALITY  TROPONIN I (HIGH SENSITIVITY)  TROPONIN I (HIGH SENSITIVITY)    EKG None  Radiology DG Chest 2 View  Result Date: 02/18/2021 CLINICAL DATA:  Weakness EXAM: CHEST - 2 VIEW COMPARISON:  02/11/2021 FINDINGS: Left pleural effusion and extensive airspace disease throughout the left lung with volume loss. Aeration opacities worsening since prior study. Right lung clear. Heart is likely mildly enlarged. Aortic atherosclerosis. IMPRESSION: Volume loss with left effusion and left lung airspace disease, worsened since prior study. Findings concerning for pneumonia. Electronically Signed   By: Rolm Baptise M.D.   On: 02/18/2021 18:02    Procedures Procedures   Medications Ordered in ED Medications  amoxicillin-clavulanate (AUGMENTIN) 875-125 MG per tablet 1 tablet (1 tablet Oral Given 02/18/21 2134)    ED Course  I have reviewed the triage vital signs and the nursing notes.  Pertinent labs & imaging results that were available during my care of the patient  were reviewed by me and considered in my medical decision making (see chart for details).  Clinical Course as of 02/20/21 0005  Mon Feb 18, 2021  1904 DG Chest 2 View [SG]    Clinical Course User Index [SG] Jeanell Sparrow, DO   MDM Rules/Calculators/A&P                          CC: weakness  This patient complains of weakness; this involves an extensive number of treatment options and is a complaint that carries with it a high risk of complications and morbidity. Vital signs were reviewed. Serious etiologies considered.  Record review:  Previous records obtained and reviewed    Work up as above, notable for:  Labs & imaging results that were available during my care of the patient were  reviewed by me and considered in my medical decision making.   I ordered imaging studies which included CXR and I independently visualized and interpreted imaging which showed PNA   Pt with pna on CXR, leukocytosis, will start antibiotics for presumed PNA, this is likely etiology of his complaints today. No acute respiratory distress, reports that he is feeling much better at this time.   F/u with pcp regarding thyroid labs  The patient improved significantly and was discharged in stable condition. Detailed discussions were had with the patient regarding current findings, and need for close f/u with PCP or on call doctor. The patient has been instructed to return immediately if the symptoms worsen in any way for re-evaluation. Patient verbalized understanding and is in agreement with current care plan. All questions answered prior to discharge.          This chart was dictated using voice recognition software.  Despite best efforts to proofread,  errors can occur which can change the documentation meaning.    Final Clinical Impression(s) / ED Diagnoses Final diagnoses:  Community acquired pneumonia, unspecified laterality  Atrial flutter, unspecified type [facility])    Rx / DC Orders ED Discharge Orders          Ordered    amoxicillin-clavulanate (AUGMENTIN) 875-125 MG tablet  Every 12 hours        02/18/21 2113    azithromycin (ZITHROMAX) 250 MG tablet  Daily        02/18/21 2113             Jeanell Sparrow, DO 02/20/21 0005

## 2021-02-18 NOTE — Discharge Instructions (Addendum)
Please follow-up with your primary care doctor and cardiologist.  Please return to the emergency department for any worsening or worrisome symptoms.

## 2021-02-18 NOTE — ED Triage Notes (Signed)
Patient BIB EMS from home c/o weakness x1 day. Per report pt had his radiation yesterday.  Per report pt got dizzy and lightheaded few times today. Denies N/V/D. Denies fall. Pt a/ox4. Pt hx of lung Ca.  CBG 80 BP 122/66 HR 81 RR 20 O2sat 100% on 6L [place]

## 2021-02-19 ENCOUNTER — Ambulatory Visit
Admission: RE | Admit: 2021-02-19 | Discharge: 2021-02-19 | Disposition: A | Discharge status: Home / Self Care | Payer: Medicare Other | Source: Ambulatory Visit | Attending: Radiation Oncology | Admitting: Radiation Oncology

## 2021-02-19 ENCOUNTER — Other Ambulatory Visit: Payer: Self-pay

## 2021-02-19 DIAGNOSIS — Z87891 Personal history of nicotine dependence: Secondary | ICD-10-CM | POA: Diagnosis not present

## 2021-02-19 DIAGNOSIS — Z510 Encounter for antineoplastic radiation therapy: Secondary | ICD-10-CM | POA: Diagnosis not present

## 2021-02-19 DIAGNOSIS — C3432 Malignant neoplasm of lower lobe, left bronchus or lung: Secondary | ICD-10-CM | POA: Diagnosis not present

## 2021-02-19 LAB — OSMOLALITY: Osmolality: 293 mOsm/kg (ref 275–295)

## 2021-02-19 LAB — T4, FREE: Free T4: 1.29 ng/dL — ABNORMAL HIGH (ref 0.61–1.12)

## 2021-02-20 ENCOUNTER — Ambulatory Visit: Payer: Medicare Other

## 2021-02-20 ENCOUNTER — Encounter (HOSPITAL_COMMUNITY): Payer: Self-pay | Admitting: Emergency Medicine

## 2021-02-20 ENCOUNTER — Ambulatory Visit
Admission: RE | Admit: 2021-02-20 | Discharge: 2021-02-20 | Disposition: A | Discharge status: Home / Self Care | Payer: Medicare Other | Source: Ambulatory Visit | Attending: Radiation Oncology | Admitting: Radiation Oncology

## 2021-02-20 ENCOUNTER — Other Ambulatory Visit: Payer: Medicare Other

## 2021-02-20 ENCOUNTER — Ambulatory Visit: Payer: Medicare Other | Admitting: Physician Assistant

## 2021-02-20 ENCOUNTER — Telehealth: Payer: Self-pay | Admitting: Cardiology

## 2021-02-20 DIAGNOSIS — Z510 Encounter for antineoplastic radiation therapy: Secondary | ICD-10-CM | POA: Diagnosis not present

## 2021-02-20 DIAGNOSIS — Z87891 Personal history of nicotine dependence: Secondary | ICD-10-CM | POA: Diagnosis not present

## 2021-02-20 DIAGNOSIS — C3432 Malignant neoplasm of lower lobe, left bronchus or lung: Secondary | ICD-10-CM | POA: Diagnosis not present

## 2021-02-20 NOTE — Telephone Encounter (Signed)
Spoke with wife to let her know I scheduled patient with Dr. Caryl Comes at [address] on 12/30 at 2:30 pm. She thanked me.

## 2021-02-20 NOTE — Telephone Encounter (Signed)
Pt is returning call to triage

## 2021-02-20 NOTE — Telephone Encounter (Signed)
New Message:     Wife called and said patient was seen in  [facility] ER on 02-18-21.  She said he was told that he needs to see his Cardiologist asap, he is having  Atrial Flutter, also chest. pain off and on. There asre no available slots asap. Wife said the patient needs to be seen asap.

## 2021-02-21 ENCOUNTER — Other Ambulatory Visit: Payer: Self-pay

## 2021-02-21 ENCOUNTER — Telehealth: Payer: Self-pay

## 2021-02-21 ENCOUNTER — Ambulatory Visit
Admission: RE | Admit: 2021-02-21 | Discharge: 2021-02-21 | Disposition: A | Discharge status: Home / Self Care | Payer: Medicare Other | Source: Ambulatory Visit | Attending: Radiation Oncology | Admitting: Radiation Oncology

## 2021-02-21 ENCOUNTER — Encounter: Payer: Self-pay | Admitting: Pulmonary Disease

## 2021-02-21 ENCOUNTER — Ambulatory Visit (INDEPENDENT_AMBULATORY_CARE_PROVIDER_SITE_OTHER): Payer: Medicare Other | Admitting: Pulmonary Disease

## 2021-02-21 VITALS — BP 114/62 | HR 52 | Temp 97.8°F | Ht 73.0 in | Wt 199.0 lb

## 2021-02-21 DIAGNOSIS — C3492 Malignant neoplasm of unspecified part of left bronchus or lung: Secondary | ICD-10-CM

## 2021-02-21 DIAGNOSIS — R7989 Other specified abnormal findings of blood chemistry: Secondary | ICD-10-CM | POA: Diagnosis not present

## 2021-02-21 DIAGNOSIS — G20 Parkinson's disease: Secondary | ICD-10-CM | POA: Diagnosis not present

## 2021-02-21 DIAGNOSIS — E119 Type 2 diabetes mellitus without complications: Secondary | ICD-10-CM | POA: Diagnosis not present

## 2021-02-21 DIAGNOSIS — Z510 Encounter for antineoplastic radiation therapy: Secondary | ICD-10-CM | POA: Diagnosis not present

## 2021-02-21 DIAGNOSIS — U071 COVID-19: Secondary | ICD-10-CM | POA: Diagnosis not present

## 2021-02-21 DIAGNOSIS — Z87891 Personal history of nicotine dependence: Secondary | ICD-10-CM | POA: Diagnosis not present

## 2021-02-21 DIAGNOSIS — E041 Nontoxic single thyroid nodule: Secondary | ICD-10-CM | POA: Diagnosis not present

## 2021-02-21 DIAGNOSIS — Z09 Encounter for follow-up examination after completed treatment for conditions other than malignant neoplasm: Secondary | ICD-10-CM | POA: Diagnosis not present

## 2021-02-21 DIAGNOSIS — Z8616 Personal history of COVID-19: Secondary | ICD-10-CM | POA: Diagnosis not present

## 2021-02-21 DIAGNOSIS — C3432 Malignant neoplasm of lower lobe, left bronchus or lung: Secondary | ICD-10-CM | POA: Diagnosis not present

## 2021-02-21 DIAGNOSIS — D509 Iron deficiency anemia, unspecified: Secondary | ICD-10-CM | POA: Diagnosis not present

## 2021-02-21 DIAGNOSIS — J9621 Acute and chronic respiratory failure with hypoxia: Secondary | ICD-10-CM | POA: Diagnosis not present

## 2021-02-21 DIAGNOSIS — D508 Other iron deficiency anemias: Secondary | ICD-10-CM | POA: Diagnosis not present

## 2021-02-21 DIAGNOSIS — I4892 Unspecified atrial flutter: Secondary | ICD-10-CM | POA: Diagnosis not present

## 2021-02-21 DIAGNOSIS — R918 Other nonspecific abnormal finding of lung field: Secondary | ICD-10-CM

## 2021-02-21 DIAGNOSIS — Z8679 Personal history of other diseases of the circulatory system: Secondary | ICD-10-CM | POA: Diagnosis not present

## 2021-02-21 DIAGNOSIS — J9611 Chronic respiratory failure with hypoxia: Secondary | ICD-10-CM

## 2021-02-21 NOTE — Telephone Encounter (Signed)
Spoke with patient wife told her that patient doesn't need to come in for labs tomorrow.  She appreciates the call and understands.  Nothing else follows.

## 2021-02-21 NOTE — Progress Notes (Signed)
Synopsis: Referred in November 2020 through for lung mass by [name], MD  Subjective:   PATIENT ID: James [name] GENDER: male DOB: March 26, 1945, MRN: [id number]  Chief Complaint  Patient presents with   Follow-up    Follow up on bronch    This is a 75 year old gentleman, past medical history of hypertension hyperlipidemia, COPD, diabetes.Patient was taken on 01/28/2021 for bronchoscopy.  Patient was found to have a large obstructing mass.Large component of left-sided mainstem obstruction from tumor.  Were successful in opening up the left upper lobe.  The left lower lobe was fully obstructed with mass.  Patient has subsequently establish care with Dr. Earlie Server.  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  Small pleural effusion not confirmed malignant.  From respiratory standpoint he still having some shortness of breath.  Unfortunately he was recently admitted to the hospital with atrial flutter.  He has started his radiation treatments.  Unfortunately has not been able to start his chemotherapy treatments yet.  He has follow-up with medical oncology this week.  He is out of the hospital and his rate is better controlled at this time.  Still has some chest pressure on occasion and shortness of breath.  He was started on anticoagulation with Eliquis.  Has not had any additional hemoptysis.   Past Medical History:  Diagnosis Date   Allergy    seasonal   Blind left eye    legally   Blood transfusion    2002   COPD (chronic obstructive pulmonary disease) (HCC)    Depression    Diabetes mellitus, type 2 (HCC)    Diverticulosis    Dyspnea    ED (erectile dysfunction)    Glaucoma    Hyperlipidemia    Hypertension    Insomnia    Internal and external bleeding hemorrhoids    Iron deficiency anemia    Osteoarthritis    Parkinson's disease ([place]) 2014   Dr. [name] PCP  and Dr. [name] at [facility]   Rosacea    Tremor of  both hands    Tubular adenoma of colon 03/28/2011     Family History  Problem Relation Age of Onset   CAD Father    Alcohol abuse Father    Heart disease Father    COPD Mother    Asthma Mother    Emphysema Mother    Cancer Maternal Grandmother    Colon cancer Neg Hx      Past Surgical History:  Procedure Laterality Date   ARTHROSCOPIC REPAIR ACL     BALLOON DILATION  01/28/2021   Procedure: BALLOON DILATION;  Surgeon: [name], DO;  Location: [facility];  Service: Pulmonary;;   BRONCHIAL BIOPSY  01/28/2021   Procedure: BRONCHIAL BIOPSIES;  Surgeon: [name], DO;  Location: [place];  Service: Pulmonary;;   [place]   right   COLONOSCOPY WITH PROPOFOL N/A 07/25/2015   Procedure: COLONOSCOPY WITH PROPOFOL;  Surgeon: [name], MD;  Location: [facility];  Service: Endoscopy;  Laterality: N/A;   CRYOTHERAPY  01/28/2021   Procedure: CRYOTHERAPY;  Surgeon: [name], DO;  Location: [facility];  Service: Pulmonary;;   ESOPHAGOGASTRODUODENOSCOPY (EGD) WITH PROPOFOL N/A 07/25/2015   Procedure: ESOPHAGOGASTRODUODENOSCOPY (EGD) WITH PROPOFOL;  Surgeon: [name], MD;  Location: [facility];  Service: Endoscopy;  Laterality: N/A;   HEMOSTASIS CONTROL  01/28/2021   Procedure: HEMOSTASIS  CONTROL;  Surgeon: [name], DO;  Location: [place];  Service: Pulmonary;;   INTRAVASCULAR PRESSURE WIRE/FFR STUDY N/A 01/22/2021   Procedure: INTRAVASCULAR PRESSURE WIRE/FFR STUDY;  Surgeon: [name], Peter M, MD;  Location: [place] [facility];  Service: Cardiovascular;  Laterality: N/A;   IR THORACENTESIS ASP PLEURAL SPACE W/IMG GUIDE  09/17/2020   IR THORACENTESIS ASP PLEURAL SPACE W/IMG GUIDE  10/25/2020   KNEE ARTHROSCOPY  1996   LEFT HEART CATH AND CORONARY ANGIOGRAPHY N/A 01/22/2021   Procedure: LEFT HEART CATH AND CORONARY ANGIOGRAPHY;  Surgeon: [name], Peter M, MD;  Location: [place];  Service:  Cardiovascular;  Laterality: N/A;   PARTIAL COLECTOMY  2008   THYROID CYST EXCISION     TONSILLECTOMY     VIDEO BRONCHOSCOPY Left 01/28/2021   Procedure: VIDEO BRONCHOSCOPY WITHOUT FLUORO;  Surgeon: [name], DO;  Location: [place];  Service: Pulmonary;  Laterality: Left;    Social History   Socioeconomic History   Marital status: Married    Spouse name: Not on file   Number of children: 1   Years of education: HS   Highest education level: Not on file  Occupational History   Occupation: retired    Fish farm manager: RETIRED  Tobacco Use   Smoking status: Former    Packs/day: 1.50    Years: 50.00    Pack years: 75.00    Types: Cigarettes    Quit date: 04/30/2015    Years since quitting: 5.8   Smokeless tobacco: Never   Tobacco comments:    States he sometimes sneaks a cigarette  Vaping Use   Vaping Use: Never used  Substance and Sexual Activity   Alcohol use: Not Currently    Comment: once yearly   Drug use: No   Sexual activity: Not on file  Other Topics Concern   Not on file  Social History Narrative   Drinks 1-2 P[employer] a day    Social Determinants of Health   Financial Resource Strain: Not on file  Food Insecurity: Not on file  Transportation Needs: Not on file  Physical Activity: Not on file  Stress: Not on file  Social Connections: Not on file  Intimate Partner Violence: Not on file     Allergies  Allergen Reactions   Aspirin Other (See Comments)    GI bleed   Codeine Itching   Morphine Itching     Outpatient Medications Prior to Visit  Medication Sig Dispense Refill   amoxicillin-clavulanate (AUGMENTIN) 875-125 MG tablet Take 1 tablet by mouth every 12 (twelve) hours. 14 tablet 0   apixaban (ELIQUIS) 5 MG TABS tablet Take 1 tablet (5 mg total) by mouth 2 (two) times daily. 60 tablet 3   atorvastatin (LIPITOR) 20 MG tablet Take 20 mg by mouth at bedtime.      azithromycin (ZITHROMAX) 250 MG tablet Take 1 tablet (250 mg total) by mouth daily. Take  first 2 tablets together, then 1 every day until finished. 6 tablet 0   buPROPion (WELLBUTRIN XL) 300 MG 24 hr tablet Take 150 mg by mouth 2 (two) times daily.     calcium-vitamin D (OSCAL WITH D) 250-125 MG-UNIT per tablet Take 1 tablet by mouth daily.     carbidopa-levodopa (SINEMET IR) 25-100 MG tablet TAKE 1.5 TABLETS BY MOUTH 3 TIMES DAILY. (Patient taking differently: Take 1.5 tablets by mouth 3 (three) times daily.) 405 tablet 3   cetirizine (ZYRTEC) 10 MG tablet Take 10 mg by mouth 2 (two) times  daily.     dexamethasone (DECADRON) 6 MG tablet Take 1 tablet (6 mg total) by mouth daily for 5 days. 5 tablet 0   diltiazem (CARDIZEM CD) 300 MG 24 hr capsule Take 1 capsule (300 mg total) by mouth daily. 30 capsule 3   donepezil (ARICEPT) 10 MG tablet Take 1 tablet (10 mg total) by mouth at bedtime. 90 tablet 3   ferrous gluconate (FERGON) 324 MG tablet TAKE 1 TABLET BY MOUTH DAILY WITH BREAKFAST (Patient taking differently: 324 mg daily with breakfast.) 90 tablet 3   formoterol (PERFOROMIST) 20 MCG/2ML nebulizer solution Take 2 mLs (20 mcg total) by nebulization 2 (two) times daily. (Patient taking differently: Take 20 mcg by nebulization daily as needed (cough).) 120 mL 5   Green Tea 315 MG CAPS Take 315 mg by mouth daily.     ibuprofen (ADVIL) 800 MG tablet Take 1 tablet (800 mg total) by mouth every 8 (eight) hours as needed for moderate pain. 30 tablet 1   MEGARED OMEGA-3 KRILL OIL PO Take 1 capsule by mouth daily.     metFORMIN (GLUCOPHAGE) 500 MG tablet Take 1 tablet (500 mg total) by mouth 2 (two) times daily with a meal.     metoprolol tartrate (LOPRESSOR) 25 MG tablet Take 0.5 tablets (12.5 mg total) by mouth 2 (two) times daily. Hold if heart rate less than 60 or blood pressure less than 100/60 30 tablet 3   mirtazapine (REMERON) 15 MG tablet Take 15 mg by mouth at bedtime.     modafinil (PROVIGIL) 100 MG tablet Take 1 tablet (100 mg total) by mouth daily. 30 tablet 5   Multiple  Vitamins-Minerals (MULTIVITAMIN WITH MINERALS) tablet Take 1 tablet by mouth daily. Centrum     omeprazole (PRILOSEC OTC) 20 MG tablet Take 20 mg by mouth every evening.     OXYGEN Inhale 4-6 L into the lungs continuous.     prochlorperazine (COMPAZINE) 10 MG tablet Take 1 tablet (10 mg total) by mouth every 6 (six) hours as needed for nausea or vomiting. 30 tablet 0   revefenacin (YUPELRI) 175 MCG/3ML nebulizer solution Take 3 mLs (175 mcg total) by nebulization daily. 90 mL 5   sertraline (ZOLOFT) 100 MG tablet Take 150 mg by mouth daily.     traMADol (ULTRAM) 50 MG tablet Take 1 tablet (50 mg total) by mouth every 6 (six) hours as needed. (Patient taking differently: Take 50 mg by mouth every 6 (six) hours as needed for moderate pain.) 60 tablet 0   nitroGLYCERIN (NITROSTAT) 0.4 MG SL tablet Place 1 tablet (0.4 mg total) under the tongue every 5 (five) minutes as needed for chest pain. 25 tablet 3   potassium chloride SA (KLOR-CON M) 20 MEQ tablet Take 1 tablet (20 mEq total) by mouth 2 (two) times daily. 14 tablet 0   No facility-administered medications prior to visit.    Review of Systems  Constitutional:  Negative for chills, fever, malaise/fatigue and weight loss.  HENT:  Negative for hearing loss, sore throat and tinnitus.   Eyes:  Negative for blurred vision and double vision.  Respiratory:  Positive for shortness of breath. Negative for cough, hemoptysis, sputum production, wheezing and stridor.   Cardiovascular:  Negative for chest pain, palpitations, orthopnea, leg swelling and PND.  Gastrointestinal:  Negative for abdominal pain, constipation, diarrhea, heartburn, nausea and vomiting.  Genitourinary:  Negative for dysuria, hematuria and urgency.  Musculoskeletal:  Negative for joint pain and myalgias.  Skin:  Negative for  itching and rash.  Neurological:  Negative for dizziness, tingling, weakness and headaches.  Endo/Heme/Allergies:  Negative for environmental allergies. Does  not bruise/bleed easily.  Psychiatric/Behavioral:  Negative for depression. The patient is not nervous/anxious and does not have insomnia.   All other systems reviewed and are negative.   Objective:  Physical Exam Vitals reviewed.  Constitutional:      General: He is not in acute distress.    Appearance: He is well-developed.  HENT:     Head: Normocephalic and atraumatic.  Eyes:     General: No scleral icterus.    Conjunctiva/sclera: Conjunctivae normal.     Pupils: Pupils are equal, round, and reactive to light.  Neck:     Vascular: No JVD.     Trachea: No tracheal deviation.  Cardiovascular:     Rate and Rhythm: Normal rate and regular rhythm.     Heart sounds: Normal heart sounds. No murmur heard. Pulmonary:     Effort: Pulmonary effort is normal. No tachypnea, accessory muscle usage or respiratory distress.     Breath sounds: No stridor. Rhonchi (in the left chest) present. No rales.     Comments: Near absent breath sounds in the left base Abdominal:     General: There is no distension.     Palpations: Abdomen is soft.     Tenderness: There is no abdominal tenderness.  Musculoskeletal:        General: No tenderness.     Cervical back: Neck supple.  Lymphadenopathy:     Cervical: No cervical adenopathy.  Skin:    General: Skin is warm and dry.     Capillary Refill: Capillary refill takes less than 2 seconds.     Findings: No rash.  Neurological:     Mental Status: He is alert and oriented to person, place, and time.  Psychiatric:        Behavior: Behavior normal.     Vitals:   02/21/21 1217  BP: 114/62  Pulse: (!) 52  Temp: 97.8 F (36.6 C)  TempSrc: Oral  SpO2: 97%  Weight: 199 lb (90.3 kg)  Height: 6\' 1"  (1.854 m)   97% on RA BMI Readings from Last 3 Encounters:  02/21/21 26.25 kg/m  02/18/21 26.47 kg/m  02/11/21 26.55 kg/m   Wt Readings from Last 3 Encounters:  02/21/21 199 lb (90.3 kg)  02/18/21 200 lb 9.9 oz (91 kg)  02/11/21 201 lb 3.2 oz  (91.3 kg)     CBC    Component Value Date/Time   WBC 14.3 (H) 02/18/2021 1906   RBC 4.70 02/18/2021 1906   HGB 9.1 (L) 02/18/2021 1906   HGB 8.0 (L) 02/11/2021 1121   HGB 8.4 (L) 01/15/2021 1414   HCT 33.8 (L) 02/18/2021 1906   HCT 29.3 (L) 01/15/2021 1414   PLT 469 (H) 02/18/2021 1906   PLT 524 (H) 02/11/2021 1121   PLT 407 01/15/2021 1414   MCV 71.9 (L) 02/18/2021 1906   MCV 71 (L) 01/15/2021 1414   MCH 19.4 (L) 02/18/2021 1906   MCHC 26.9 (L) 02/18/2021 1906   RDW 19.1 (H) 02/18/2021 1906   RDW 17.5 (H) 01/15/2021 1414   LYMPHSABS 0.7 02/18/2021 1906   MONOABS 1.1 (H) 02/18/2021 1906   EOSABS 0.2 02/18/2021 1906   BASOSABS 0.0 02/18/2021 1906    Chest Imaging:  02/18/2021 chest x-ray: Volume loss on the left side, shift to the airways into the left.  Known left obstructing tumor. The patient's images have been independently  reviewed by me.    Pulmonary Functions Testing Results: PFT Results Latest Ref Rng & Units 05/02/2020 06/26/2015  FVC-Pre L 3.51 3.24  FVC-Predicted Pre % 73 65  FVC-Post L 3.72 3.85  FVC-Predicted Post % 77 77  Pre FEV1/FVC % % 52 55  Post FEV1/FCV % % 55 57  FEV1-Pre L 1.84 1.79  FEV1-Predicted Pre % 52 48  FEV1-Post L 2.04 2.19  DLCO uncorrected ml/min/mmHg 11.54 16.61  DLCO UNC% % 41 45  DLCO corrected ml/min/mmHg 11.54 17.01  DLCO COR %Predicted % 41 46  DLVA Predicted % 49 54  TLC L 7.84 7.51  TLC % Predicted % 102 98  RV % Predicted % 142 113    FeNO:   Pathology:   Echocardiogram:   Heart Catheterization:     Assessment & Plan:     ICD-10-CM   1. Lung mass  R91.8     2. Chronic respiratory failure with hypoxia (HCC)  J96.11     3. Parkinson's disease ([place])  G20     4. 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  No additional hemoptysis following  bronchoscopy.  Also currently undergoing radiation treatments.  Plan: Follow-up with medical oncology to start chemo Continue follow-up with radiation oncology to complete radiation treatment course. Due to the patient's endobronchial tumor and the amount of bulky disease that he had is likely to benefit from repeat bronchoscopy and consideration for cryotherapy. I will have him see [place] again at the end of February/beginning of March hopefully after he has completed his chemotherapy and radiation treatments for consideration of repeat bronchoscopy for airway inspection and consideration for cryotherapy.    Current Outpatient Medications:    amoxicillin-clavulanate (AUGMENTIN) 875-125 MG tablet, Take 1 tablet by mouth every 12 (twelve) hours., Disp: 14 tablet, Rfl: 0   apixaban (ELIQUIS) 5 MG TABS tablet, Take 1 tablet (5 mg total) by mouth 2 (two) times daily., Disp: 60 tablet, Rfl: 3   atorvastatin (LIPITOR) 20 MG tablet, Take 20 mg by mouth at bedtime. , Disp: , Rfl:    azithromycin (ZITHROMAX) 250 MG tablet, Take 1 tablet (250 mg total) by mouth daily. Take first 2 tablets together, then 1 every day until finished., Disp: 6 tablet, Rfl: 0   buPROPion (WELLBUTRIN XL) 300 MG 24 hr tablet, Take 150 mg by mouth 2 (two) times daily., Disp: , Rfl:    calcium-vitamin D (OSCAL WITH D) 250-125 MG-UNIT per tablet, Take 1 tablet by mouth daily., Disp: , Rfl:    carbidopa-levodopa (SINEMET IR) 25-100 MG tablet, TAKE 1.5 TABLETS BY MOUTH 3 TIMES DAILY. (Patient taking differently: Take 1.5 tablets by mouth 3 (three) times daily.), Disp: 405 tablet, Rfl: 3   cetirizine (ZYRTEC) 10 MG tablet, Take 10 mg by mouth 2 (two) times daily., Disp: , Rfl:    dexamethasone (DECADRON) 6 MG tablet, Take 1 tablet (6 mg total) by mouth daily for 5 days., Disp: 5 tablet, Rfl: 0   diltiazem (CARDIZEM CD) 300 MG 24 hr capsule, Take 1 capsule (300 mg total) by mouth daily., Disp: 30 capsule, Rfl: 3   donepezil (ARICEPT) 10 MG  tablet, Take 1 tablet (10 mg total) by mouth at bedtime., Disp: 90 tablet, Rfl: 3   ferrous gluconate (FERGON) 324 MG tablet, TAKE 1 TABLET BY MOUTH DAILY  WITH BREAKFAST (Patient taking differently: 324 mg daily with breakfast.), Disp: 90 tablet, Rfl: 3   formoterol (PERFOROMIST) 20 MCG/2ML nebulizer solution, Take 2 mLs (20 mcg total) by nebulization 2 (two) times daily. (Patient taking differently: Take 20 mcg by nebulization daily as needed (cough).), Disp: 120 mL, Rfl: 5   Green Tea 315 MG CAPS, Take 315 mg by mouth daily., Disp: , Rfl:    ibuprofen (ADVIL) 800 MG tablet, Take 1 tablet (800 mg total) by mouth every 8 (eight) hours as needed for moderate pain., Disp: 30 tablet, Rfl: 1   MEGARED OMEGA-3 KRILL OIL PO, Take 1 capsule by mouth daily., Disp: , Rfl:    metFORMIN (GLUCOPHAGE) 500 MG tablet, Take 1 tablet (500 mg total) by mouth 2 (two) times daily with a meal., Disp: , Rfl:    metoprolol tartrate (LOPRESSOR) 25 MG tablet, Take 0.5 tablets (12.5 mg total) by mouth 2 (two) times daily. Hold if heart rate less than 60 or blood pressure less than 100/60, Disp: 30 tablet, Rfl: 3   mirtazapine (REMERON) 15 MG tablet, Take 15 mg by mouth at bedtime., Disp: , Rfl:    modafinil (PROVIGIL) 100 MG tablet, Take 1 tablet (100 mg total) by mouth daily., Disp: 30 tablet, Rfl: 5   Multiple Vitamins-Minerals (MULTIVITAMIN WITH MINERALS) tablet, Take 1 tablet by mouth daily. Centrum, Disp: , Rfl:    omeprazole (PRILOSEC OTC) 20 MG tablet, Take 20 mg by mouth every evening., Disp: , Rfl:    OXYGEN, Inhale 4-6 L into the lungs continuous., Disp: , Rfl:    prochlorperazine (COMPAZINE) 10 MG tablet, Take 1 tablet (10 mg total) by mouth every 6 (six) hours as needed for nausea or vomiting., Disp: 30 tablet, Rfl: 0   revefenacin (YUPELRI) 175 MCG/3ML nebulizer solution, Take 3 mLs (175 mcg total) by nebulization daily., Disp: 90 mL, Rfl: 5   sertraline (ZOLOFT) 100 MG tablet, Take 150 mg by mouth daily., Disp: ,  Rfl:    traMADol (ULTRAM) 50 MG tablet, Take 1 tablet (50 mg total) by mouth every 6 (six) hours as needed. (Patient taking differently: Take 50 mg by mouth every 6 (six) hours as needed for moderate pain.), Disp: 60 tablet, Rfl: 0   nitroGLYCERIN (NITROSTAT) 0.4 MG SL tablet, Place 1 tablet (0.4 mg total) under the tongue every 5 (five) minutes as needed for chest pain., Disp: 25 tablet, Rfl: 3   potassium chloride SA (KLOR-CON M) 20 MEQ tablet, Take 1 tablet (20 mEq total) by mouth 2 (two) times daily., Disp: 14 tablet, Rfl: 0  I spent 42 minutes dedicated to the care of this patient on the date of this encounter to include pre-visit review of records, face-to-face time with the patient discussing conditions above, post visit ordering of testing, clinical documentation with the electronic health record, making appropriate referrals as documented, and communicating necessary findings to members of the patients care team.   [name], DO [facility] 02/21/2021 12:25 PM

## 2021-02-21 NOTE — Patient Instructions (Signed)
Thank you for visiting Dr. Valeta Harms at [facility]. Today we recommend the following:  We will discuss repeat bronchoscopy at next office visit after chemo complete.   Return in about 8 weeks (around 04/18/2021) for w/ Dr. Valeta Harms .    Please do your part to reduce the spread of COVID-19.

## 2021-02-22 ENCOUNTER — Encounter: Payer: Self-pay | Admitting: Internal Medicine

## 2021-02-22 ENCOUNTER — Ambulatory Visit
Admission: RE | Admit: 2021-02-22 | Discharge: 2021-02-22 | Disposition: A | Discharge status: Home / Self Care | Payer: Medicare Other | Source: Ambulatory Visit | Attending: Radiation Oncology | Admitting: Radiation Oncology

## 2021-02-22 ENCOUNTER — Telehealth: Payer: Self-pay

## 2021-02-22 ENCOUNTER — Encounter: Payer: Self-pay | Admitting: Hematology and Oncology

## 2021-02-22 ENCOUNTER — Ambulatory Visit (INDEPENDENT_AMBULATORY_CARE_PROVIDER_SITE_OTHER): Payer: Medicare Other | Admitting: Internal Medicine

## 2021-02-22 ENCOUNTER — Other Ambulatory Visit: Payer: Medicare Other

## 2021-02-22 ENCOUNTER — Other Ambulatory Visit: Payer: Self-pay

## 2021-02-22 VITALS — BP 110/62 | HR 79 | Ht 73.0 in | Wt 199.0 lb

## 2021-02-22 DIAGNOSIS — I4892 Unspecified atrial flutter: Secondary | ICD-10-CM

## 2021-02-22 DIAGNOSIS — C3432 Malignant neoplasm of lower lobe, left bronchus or lung: Secondary | ICD-10-CM

## 2021-02-22 DIAGNOSIS — Z87891 Personal history of nicotine dependence: Secondary | ICD-10-CM | POA: Diagnosis not present

## 2021-02-22 DIAGNOSIS — Z510 Encounter for antineoplastic radiation therapy: Secondary | ICD-10-CM | POA: Diagnosis not present

## 2021-02-22 MED FILL — Dexamethasone Sodium Phosphate Inj 100 MG/10ML: INTRAMUSCULAR | Qty: 1 | Status: AC

## 2021-02-22 NOTE — Telephone Encounter (Signed)
I spoke with pts wife, [name], to ensure they understood the COVID protocol for his appt next week 02/27/20. She understands they are to call the CC from the car to advise he is here for tx but has recent COVID infection (dx 02/11/21) and a nurse will come to get him from the car. She expressed understanding of this information.

## 2021-02-25 NOTE — Progress Notes (Signed)
Not seen

## 2021-02-26 ENCOUNTER — Inpatient Hospital Stay: Payer: Medicare Other

## 2021-02-26 ENCOUNTER — Ambulatory Visit
Admission: RE | Admit: 2021-02-26 | Discharge: 2021-02-26 | Disposition: A | Discharge status: Home / Self Care | Payer: Medicare Other | Source: Ambulatory Visit | Attending: Radiation Oncology | Admitting: Radiation Oncology

## 2021-02-26 ENCOUNTER — Inpatient Hospital Stay (HOSPITAL_BASED_OUTPATIENT_CLINIC_OR_DEPARTMENT_OTHER): Payer: Medicare Other | Admitting: Physician Assistant

## 2021-02-26 ENCOUNTER — Ambulatory Visit: Payer: Medicare Other | Admitting: Internal Medicine

## 2021-02-26 ENCOUNTER — Other Ambulatory Visit: Payer: Medicare Other

## 2021-02-26 ENCOUNTER — Other Ambulatory Visit: Payer: Self-pay

## 2021-02-26 ENCOUNTER — Ambulatory Visit: Payer: Medicare Other | Admitting: Hematology and Oncology

## 2021-02-26 VITALS — BP 118/66 | HR 79 | Temp 98.9°F | Resp 18

## 2021-02-26 DIAGNOSIS — C3432 Malignant neoplasm of lower lobe, left bronchus or lung: Secondary | ICD-10-CM

## 2021-02-26 DIAGNOSIS — D509 Iron deficiency anemia, unspecified: Secondary | ICD-10-CM | POA: Insufficient documentation

## 2021-02-26 DIAGNOSIS — Z5111 Encounter for antineoplastic chemotherapy: Secondary | ICD-10-CM | POA: Insufficient documentation

## 2021-02-26 DIAGNOSIS — D649 Anemia, unspecified: Secondary | ICD-10-CM

## 2021-02-26 DIAGNOSIS — Z510 Encounter for antineoplastic radiation therapy: Secondary | ICD-10-CM | POA: Insufficient documentation

## 2021-02-26 DIAGNOSIS — C3492 Malignant neoplasm of unspecified part of left bronchus or lung: Secondary | ICD-10-CM | POA: Insufficient documentation

## 2021-02-26 DIAGNOSIS — Z87891 Personal history of nicotine dependence: Secondary | ICD-10-CM | POA: Insufficient documentation

## 2021-02-26 LAB — CBC WITH DIFFERENTIAL (CANCER CENTER ONLY)
Abs Immature Granulocytes: 0.05 10*3/uL (ref 0.00–0.07)
Basophils Absolute: 0 10*3/uL (ref 0.0–0.1)
Basophils Relative: 0 %
Eosinophils Absolute: 0.1 10*3/uL (ref 0.0–0.5)
Eosinophils Relative: 1 %
HCT: 30.7 % — ABNORMAL LOW (ref 39.0–52.0)
Hemoglobin: 8.7 g/dL — ABNORMAL LOW (ref 13.0–17.0)
Immature Granulocytes: 0 %
Lymphocytes Relative: 5 %
Lymphs Abs: 0.5 10*3/uL — ABNORMAL LOW (ref 0.7–4.0)
MCH: 20 pg — ABNORMAL LOW (ref 26.0–34.0)
MCHC: 28.3 g/dL — ABNORMAL LOW (ref 30.0–36.0)
MCV: 70.7 fL — ABNORMAL LOW (ref 80.0–100.0)
Monocytes Absolute: 1 10*3/uL (ref 0.1–1.0)
Monocytes Relative: 9 %
Neutro Abs: 9.7 10*3/uL — ABNORMAL HIGH (ref 1.7–7.7)
Neutrophils Relative %: 85 %
Platelet Count: 340 10*3/uL (ref 150–400)
RBC: 4.34 MIL/uL (ref 4.22–5.81)
RDW: 19.4 % — ABNORMAL HIGH (ref 11.5–15.5)
WBC Count: 11.4 10*3/uL — ABNORMAL HIGH (ref 4.0–10.5)
nRBC: 0 % (ref 0.0–0.2)

## 2021-02-26 LAB — CMP (CANCER CENTER ONLY)
ALT: <5 U/L (ref 0–44)
AST: 7 U/L — ABNORMAL LOW (ref 15–41)
Albumin: 3.2 g/dL — ABNORMAL LOW (ref 3.5–5.0)
Alkaline Phosphatase: 72 U/L (ref 38–126)
Anion gap: 8 (ref 5–15)
BUN: 16 mg/dL (ref 8–23)
CO2: 30 mmol/L (ref 22–32)
Calcium: 9.3 mg/dL (ref 8.9–10.3)
Chloride: 102 mmol/L (ref 98–111)
Creatinine: 0.8 mg/dL (ref 0.61–1.24)
GFR, Estimated: >60 mL/min (ref >60)
Glucose, Bld: 172 mg/dL — ABNORMAL HIGH (ref 70–99)
Potassium: 3.7 mmol/L (ref 3.5–5.1)
Sodium: 140 mmol/L (ref 135–145)
Total Bilirubin: 0.4 mg/dL (ref 0.3–1.2)
Total Protein: 6.3 g/dL — ABNORMAL LOW (ref 6.5–8.1)

## 2021-02-26 LAB — SAMPLE TO BLOOD BANK

## 2021-02-26 MED ORDER — PALONOSETRON HCL INJECTION 0.25 MG/5ML
0.2500 mg | Freq: Once | INTRAVENOUS | Status: AC
  Administered 2021-02-26: 0.25 mg via INTRAVENOUS
  Filled 2021-02-26: qty 5

## 2021-02-26 MED ORDER — DIPHENHYDRAMINE HCL 50 MG/ML IJ SOLN
50.0000 mg | Freq: Once | INTRAMUSCULAR | Status: AC
  Administered 2021-02-26: 50 mg via INTRAVENOUS
  Filled 2021-02-26: qty 1

## 2021-02-26 MED ORDER — SODIUM CHLORIDE 0.9 % IV SOLN: Freq: Once | INTRAVENOUS | Status: AC

## 2021-02-26 MED ORDER — SODIUM CHLORIDE 0.9 % IV SOLN
10.0000 mg | Freq: Once | INTRAVENOUS | Status: AC
  Administered 2021-02-26: 10 mg via INTRAVENOUS
  Filled 2021-02-26: qty 10

## 2021-02-26 MED ORDER — SODIUM CHLORIDE 0.9 % IV SOLN
45.0000 mg/m2 | Freq: Once | INTRAVENOUS | Status: AC
  Administered 2021-02-26: 102 mg via INTRAVENOUS
  Filled 2021-02-26: qty 17

## 2021-02-26 MED ORDER — SODIUM CHLORIDE 0.9 % IV SOLN
223.6000 mg | Freq: Once | INTRAVENOUS | Status: AC
  Administered 2021-02-26: 220 mg via INTRAVENOUS
  Filled 2021-02-26: qty 22

## 2021-02-26 MED ORDER — LIDOCAINE-PRILOCAINE 2.5-2.5 % EX CREA: 1.0000 "application " | TOPICAL_CREAM | CUTANEOUS | 2 refills | Status: AC | PRN

## 2021-02-26 MED ORDER — HYDROCODONE-ACETAMINOPHEN 5-325 MG PO TABS: 1.0000 | ORAL_TABLET | Freq: Four times a day (QID) | ORAL | 0 refills | Status: DC | PRN

## 2021-02-26 MED ORDER — FAMOTIDINE 20 MG IN NS 100 ML IVPB
20.0000 mg | Freq: Once | INTRAVENOUS | Status: AC
  Administered 2021-02-26: 20 mg via INTRAVENOUS
  Filled 2021-02-26: qty 100

## 2021-02-26 NOTE — Progress Notes (Signed)
[facility] OFFICE PROGRESS NOTE  [name], MD [address] [place] [id number]  DIAGNOSIS: Stage IIIa/IV (T3, N1, M0/M1a) non-small cell lung cancer, squamous cell carcinoma presented with central area obstructive left lung mass with probably involvement of left hilar adenopathy and suspicious but not confirmed malignant pleural effusion and right hip lesion.  This was diagnosed in December 2022.  PD-L1: 15%  No actionable mutations by guardant 360.  PRIOR THERAPY: None  CURRENT THERAPY: Concurrent chemoradiation with weekly carboplatin for AUC of 2 and paclitaxel 45 Mg/M2 for 6-7 weeks followed by immunotherapy as the patient has no evidence of disease progression. First dose expected on 02/27/20.   INTERVAL HISTORY: James [name] 76 y.o. male returns to the clinic today for a follow-up visit accompanied by his wife. When the patient was seen in the clinic on 02/01/2021, he was diagnosed with at least stage III lung cancer.  There was an indeterminant right hip lesion which an MRI of the hip was performed. This showed an indeterminate area which could represent a small metastatic lesion versus focal synovitis.  The patient's brain MRI was negative for metastatic disease to the brain.  The patient has an indeterminant pleural effusion which may or may not be malignant and the patient understands that if this is found to be malignant in the future then it would be considered stage IV.  The patient then presented to the clinic on 02/11/2021 to undergo his first cycle of treatment.  Unfortunately, the patient had new onset tachycardia with a pulse of 160.  The patient was found to be in atrial flutter.  He denies any history of arrhythmia.  The patient was sent to the emergency room and admitted from 02/11/21-02/16/21 for the new onset atrial flutter, COVID-19, and acute on chronic respiratory failure for which the patient is on 4 to 6 L of supplemental  oxygen.  He recently followed up with pulmonologist, Dr. Valeta Harms.  Dr. Valeta Harms is going to see him at the end of February/beginning of March after he completes chemo and radiation for consideration of repeat bronchoscopy for airway inspection and consideration of cryotherapy of the bulky disease if needed.   The patient does continue to have shortness of breath and states today that he is "fine as long as I have my oxygen".  He reports a mild cough intermittently but reports this is overall better from when he was first diagnosed.  He still has intermittent blood-tinged sputum.  He intermittently has sternal and right rib discomfort for which he takes tramadol.  He notes that tramadol does not always control his pain and they are wondering if he can have an alternative. He is noted to have an allergy to codeine in his chart.  Of note, the patient is presently taking Hycodan which has codeine in it and he is tolerating this well.  The patient states that his allergy was over 30 years ago.  He states that he would like to try taking Norco to see if it helps with his pain.  He is not having any pain at this time.  He is not able to take NSAIDs as he is on a blood thinner.  Overall, the patient has generalized weakness.  He is going to start physical therapy and Occupational Therapy Wednesdays and Fridays.  The patient is independent with his activities of daily living at home and has a cane and a walker.  When he goes down stairs, the patient goes  down on his bottom.  He reports progressive weakness over the last 3 weeks when he was in the hospital.  Overall, the patient states he does feel well enough to proceed with treatment as planned today.  Patient has a longstanding history of anemia.  He was previously seen in the clinic by Dr. Lindi Adie and undergoes repeat IV iron infusions every 3 months with Venofer.  His most recent infusion was on 11/12/2020.  The patient is also compliant with taking his ferrous gluconate  p.o. daily.  The patient states that he has had a longstanding history of anemia of unclear etiology for over 30 years.  His baseline hemoglobin is 8.7 today.  He required 1 unit of blood while admitted to the hospital recently.  He is due for another IV iron infusion.  His discharge summary mentions his low iron store which was checked while admitted to the hospital.  He denies any fever or chills.  He reports he has baseline night sweats for many years.  The patient denies any headaches or visual changes except for his baseline visual changes secondary to having cataract and glaucoma.  Denies any unusual nausea, vomiting, diarrhea, or constipation.  He is here today for evaluation and repeat blood work before undergoing his first cycle of treatment.  The patient and his wife are interested in having a Port-A-Cath placed.     MEDICAL HISTORY: Past Medical History:  Diagnosis Date   Allergy    seasonal   Blind left eye    legally   Blood transfusion    2002   COPD (chronic obstructive pulmonary disease) (HCC)    Depression    Diabetes mellitus, type 2 (HCC)    Diverticulosis    Dyspnea    ED (erectile dysfunction)    Glaucoma    Hyperlipidemia    Hypertension    Insomnia    Internal and external bleeding hemorrhoids    Iron deficiency anemia    Osteoarthritis    Parkinson's disease ([place]) 2014   Dr. [name] PCP  and Dr. [name] at [facility]   Rosacea    Tremor of both hands    Tubular adenoma of colon 03/28/2011    ALLERGIES:  is allergic to aspirin, codeine, and morphine.  MEDICATIONS:  Current Outpatient Medications  Medication Sig Dispense Refill   HYDROcodone-acetaminophen (NORCO) 5-325 MG tablet Take 1 tablet by mouth every 6 (six) hours as needed for moderate pain. 30 tablet 0   lidocaine-prilocaine (EMLA) cream Apply 1 application topically as needed. 30 g 2   amoxicillin-clavulanate (AUGMENTIN) 875-125 MG tablet Take 1 tablet by mouth every 12 (twelve) hours. 14 tablet 0    apixaban (ELIQUIS) 5 MG TABS tablet Take 1 tablet (5 mg total) by mouth 2 (two) times daily. 60 tablet 3   atorvastatin (LIPITOR) 20 MG tablet Take 20 mg by mouth at bedtime.      azithromycin (ZITHROMAX) 250 MG tablet Take 1 tablet (250 mg total) by mouth daily. Take first 2 tablets together, then 1 every day until finished. 6 tablet 0   buPROPion (WELLBUTRIN XL) 300 MG 24 hr tablet Take 150 mg by mouth 2 (two) times daily.     calcium-vitamin D (OSCAL WITH D) 250-125 MG-UNIT per tablet Take 1 tablet by mouth daily.     carbidopa-levodopa (SINEMET IR) 25-100 MG tablet TAKE 1.5 TABLETS BY MOUTH 3 TIMES DAILY. (Patient taking differently: Take 1.5 tablets by mouth 3 (three) times daily.) 405 tablet 3   cetirizine (ZYRTEC)  10 MG tablet Take 10 mg by mouth 2 (two) times daily.     diltiazem (CARDIZEM CD) 300 MG 24 hr capsule Take 1 capsule (300 mg total) by mouth daily. 30 capsule 3   donepezil (ARICEPT) 10 MG tablet Take 1 tablet (10 mg total) by mouth at bedtime. 90 tablet 3   ferrous gluconate (FERGON) 324 MG tablet TAKE 1 TABLET BY MOUTH DAILY WITH BREAKFAST (Patient taking differently: 324 mg daily with breakfast.) 90 tablet 3   formoterol (PERFOROMIST) 20 MCG/2ML nebulizer solution Take 2 mLs (20 mcg total) by nebulization 2 (two) times daily. (Patient taking differently: Take 20 mcg by nebulization daily as needed (cough).) 120 mL 5   Green Tea 315 MG CAPS Take 315 mg by mouth daily.     ibuprofen (ADVIL) 800 MG tablet Take 1 tablet (800 mg total) by mouth every 8 (eight) hours as needed for moderate pain. 30 tablet 1   MEGARED OMEGA-3 KRILL OIL PO Take 1 capsule by mouth daily.     metFORMIN (GLUCOPHAGE) 500 MG tablet Take 1 tablet (500 mg total) by mouth 2 (two) times daily with a meal.     metoprolol tartrate (LOPRESSOR) 25 MG tablet Take 0.5 tablets (12.5 mg total) by mouth 2 (two) times daily. Hold if heart rate less than 60 or blood pressure less than 100/60 30 tablet 3   mirtazapine  (REMERON) 15 MG tablet Take 15 mg by mouth at bedtime.     modafinil (PROVIGIL) 100 MG tablet Take 1 tablet (100 mg total) by mouth daily. 30 tablet 5   Multiple Vitamins-Minerals (MULTIVITAMIN WITH MINERALS) tablet Take 1 tablet by mouth daily. Centrum     nitroGLYCERIN (NITROSTAT) 0.4 MG SL tablet Place 1 tablet (0.4 mg total) under the tongue every 5 (five) minutes as needed for chest pain. 25 tablet 3   omeprazole (PRILOSEC OTC) 20 MG tablet Take 20 mg by mouth every evening.     OXYGEN Inhale 4-6 L into the lungs continuous.     prochlorperazine (COMPAZINE) 10 MG tablet Take 1 tablet (10 mg total) by mouth every 6 (six) hours as needed for nausea or vomiting. 30 tablet 0   revefenacin (YUPELRI) 175 MCG/3ML nebulizer solution Take 3 mLs (175 mcg total) by nebulization daily. 90 mL 5   sertraline (ZOLOFT) 100 MG tablet Take 150 mg by mouth daily.     traMADol (ULTRAM) 50 MG tablet Take 1 tablet (50 mg total) by mouth every 6 (six) hours as needed. (Patient taking differently: Take 50 mg by mouth every 6 (six) hours as needed for moderate pain.) 60 tablet 0   No current facility-administered medications for this visit.   Facility-Administered Medications Ordered in Other Visits  Medication Dose Route Frequency Provider Last Rate Last Admin   CARBOplatin (PARAPLATIN) 220 mg in sodium chloride 0.9 % 100 mL chemo infusion  220 mg Intravenous Once Curt Bears, MD       PACLitaxel (TAXOL) 102 mg in sodium chloride 0.9 % 250 mL chemo infusion (</= $RemoveBefor'80mg'JIgwYEIYoRMP$ /m2)  45 mg/m2 (Treatment Plan Recorded) Intravenous Once Curt Bears, MD        SURGICAL HISTORY:  Past Surgical History:  Procedure Laterality Date   ARTHROSCOPIC REPAIR ACL     BALLOON DILATION  01/28/2021   Procedure: BALLOON DILATION;  Surgeon: [name], DO;  Location: [facility];  Service: Pulmonary;;   BRONCHIAL BIOPSY  01/28/2021   Procedure: BRONCHIAL BIOPSIES;  Surgeon: [name], DO;  Location: [facility]  ENDOSCOPY;   Service: Pulmonary;;   [place]   right   COLONOSCOPY WITH PROPOFOL N/A 07/25/2015   Procedure: COLONOSCOPY WITH PROPOFOL;  Surgeon: [name], MD;  Location: [facility];  Service: Endoscopy;  Laterality: N/A;   CRYOTHERAPY  01/28/2021   Procedure: CRYOTHERAPY;  Surgeon: [name], DO;  Location: [facility];  Service: Pulmonary;;   ESOPHAGOGASTRODUODENOSCOPY (EGD) WITH PROPOFOL N/A 07/25/2015   Procedure: ESOPHAGOGASTRODUODENOSCOPY (EGD) WITH PROPOFOL;  Surgeon: [name], MD;  Location: [facility];  Service: Endoscopy;  Laterality: N/A;   HEMOSTASIS CONTROL  01/28/2021   Procedure: HEMOSTASIS CONTROL;  Surgeon: [name], DO;  Location: [facility];  Service: Pulmonary;;   INTRAVASCULAR PRESSURE WIRE/FFR STUDY N/A 01/22/2021   Procedure: INTRAVASCULAR PRESSURE WIRE/FFR STUDY;  Surgeon: [name], Peter M, MD;  Location: [facility] CV LAB;  Service: Cardiovascular;  Laterality: N/A;   IR THORACENTESIS ASP PLEURAL SPACE W/IMG GUIDE  09/17/2020   IR THORACENTESIS ASP PLEURAL SPACE W/IMG GUIDE  10/25/2020   KNEE ARTHROSCOPY  1996   LEFT HEART CATH AND CORONARY ANGIOGRAPHY N/A 01/22/2021   Procedure: LEFT HEART CATH AND CORONARY ANGIOGRAPHY;  Surgeon: [name], Peter M, MD;  Location: [facility];  Service: Cardiovascular;  Laterality: N/A;   PARTIAL COLECTOMY  2008   THYROID CYST EXCISION     TONSILLECTOMY     VIDEO BRONCHOSCOPY Left 01/28/2021   Procedure: VIDEO BRONCHOSCOPY WITHOUT FLUORO;  Surgeon: [name], DO;  Location: [facility];  Service: Pulmonary;  Laterality: Left;    REVIEW OF SYSTEMS:   Review of Systems  Constitutional: Positive for fatigue and generalized weakness.  Negative for appetite change, chills, fever and unexpected weight change.  HENT: Negative for mouth sores, nosebleeds, sore throat and trouble swallowing.   Eyes: Negative for eye problems and icterus.  Respiratory: Positive for  dyspnea on exertion.  Positive for mild cough.  Positive for occasional blood-tinged sputum.   Cardiovascular: Positive for intermittent right rib discomfort.  Negative for leg swelling.  Gastrointestinal: Negative for abdominal pain, constipation, diarrhea, nausea and vomiting.  Genitourinary: Negative for bladder incontinence, difficulty urinating, dysuria, frequency and hematuria.   Musculoskeletal: Negative for back pain, gait problem, neck pain and neck stiffness.  Skin: Negative for itching and rash.  Neurological: Negative for dizziness, extremity weakness, gait problem, headaches, light-headedness and seizures.  Hematological: Negative for adenopathy. Does not bruise/bleed easily.  Psychiatric/Behavioral: Negative for confusion, depression and sleep disturbance. The patient is not nervous/anxious.     PHYSICAL EXAMINATION:  There were no vitals taken for this visit.  ECOG PERFORMANCE STATUS: 2  Physical Exam  Constitutional: Oriented to person, place, and time and well-developed, well-nourished, and in no distress.  HENT:  Head: Normocephalic and atraumatic.  Mouth/Throat: Oropharynx is clear and moist. No oropharyngeal exudate.  Eyes: Conjunctivae are normal. Right eye exhibits no discharge. Left eye exhibits no discharge. No scleral icterus.  Neck: Normal range of motion. Neck supple.  Cardiovascular: Normal rate, regular rhythm, normal heart sounds and intact distal pulses.   Pulmonary/Chest: Effort normal.  Positive for rhonchi bilaterally.  On supplemental oxygen with 4 L.  No respiratory distress. No rales.  Abdominal: Soft. Bowel sounds are normal. Exhibits no distension and no mass. There is no tenderness.  Musculoskeletal: Normal range of motion. Exhibits no edema.  Lymphadenopathy:    No cervical adenopathy.  Neurological: Alert and oriented to person, place, and time.  Examined in  the chair.  Skin: Skin is warm and dry. No rash noted. Not diaphoretic. No erythema. No  pallor.  Psychiatric: Mood, memory and judgment normal.  Vitals reviewed.  LABORATORY DATA: Lab Results  Component Value Date   WBC 11.4 (H) 02/26/2021   HGB 8.7 (L) 02/26/2021   HCT 30.7 (L) 02/26/2021   MCV 70.7 (L) 02/26/2021   PLT 340 02/26/2021      Chemistry      Component Value Date/Time   NA 140 02/26/2021 1203   NA 149 (H) 01/15/2021 1414   K 3.7 02/26/2021 1203   CL 102 02/26/2021 1203   CO2 30 02/26/2021 1203   BUN 16 02/26/2021 1203   BUN 12 01/15/2021 1414   CREATININE 0.80 02/26/2021 1203      Component Value Date/Time   CALCIUM 9.3 02/26/2021 1203   ALKPHOS 72 02/26/2021 1203   AST 7 (L) 02/26/2021 1203   ALT <5 02/26/2021 1203   BILITOT 0.4 02/26/2021 1203       RADIOGRAPHIC STUDIES:  DG Chest 2 View  Result Date: 02/18/2021 CLINICAL DATA:  Weakness EXAM: CHEST - 2 VIEW COMPARISON:  02/11/2021 FINDINGS: Left pleural effusion and extensive airspace disease throughout the left lung with volume loss. Aeration opacities worsening since prior study. Right lung clear. Heart is likely mildly enlarged. Aortic atherosclerosis. IMPRESSION: Volume loss with left effusion and left lung airspace disease, worsened since prior study. Findings concerning for pneumonia. Electronically Signed   By: Rolm Baptise M.D.   On: 02/18/2021 18:02   MR Brain W Wo Contrast  Result Date: 02/02/2021 CLINICAL DATA:  Initial evaluation for non-small cell lung cancer, staging. EXAM: MRI HEAD WITHOUT AND WITH CONTRAST TECHNIQUE: Multiplanar, multiecho pulse sequences of the brain and surrounding structures were obtained without and with intravenous contrast. CONTRAST:  24mL GADAVIST GADOBUTROL 1 MMOL/ML IV SOLN COMPARISON:  Prior PET-CT from 12/26/2020. FINDINGS: Brain: Generalized age-related cerebral atrophy. Scattered patchy T2/FLAIR hyperintensity involving the periventricular and deep white matter both cerebral hemispheres as well as the pons, most consistent with chronic small  vessel ischemic disease, mild in nature. No evidence for acute or subacute infarct. Gray-white matter differentiation maintained. No encephalomalacia to suggest chronic cortical infarction. No evidence for acute or chronic intracranial hemorrhage. No mass lesion, midline shift or mass effect. Mild ventricular prominence related global parenchymal volume loss without hydrocephalus. Pituitary gland suprasellar region within normal limits. Midline structures intact and normal. No abnormal enhancement or evidence for intracranial metastatic disease. Vascular: Major intracranial vascular flow voids are maintained. Skull and upper cervical spine: Craniocervical junction within normal limits. Bone marrow signal intensity normal. No visible focal marrow replacing lesion. No scalp soft tissue abnormality. Sinuses/Orbits: Patient status post bilateral ocular lens replacement. Globes and orbital soft tissues demonstrate no acute finding. Scattered mucosal thickening noted throughout the paranasal sinuses. Trace left mastoid effusion noted, of doubtful significance. Other: None. IMPRESSION: 1. No evidence for intracranial metastatic disease. 2. Mild age-related cerebral atrophy with chronic small vessel ischemic disease. Electronically Signed   By: Jeannine Boga M.D.   On: 02/02/2021 02:41   MR Pelvis W Wo Contrast  Result Date: 02/02/2021 CLINICAL DATA:  Lung cancer. Abnormal PET-CT with focal hypermetabolism anterior to the right femoral neck. EXAM: MRI PELVIS WITHOUT AND WITH CONTRAST TECHNIQUE: Multiplanar multisequence MR imaging of the pelvis was performed both before and after administration of intravenous contrast. CONTRAST:  63mL GADAVIST GADOBUTROL 1 MMOL/ML IV SOLN COMPARISON:  PET-CT 12/26/2020 FINDINGS: Bones/Joint/Cartilage No acute fracture. No dislocation.  No femoral head avascular necrosis. Bony pelvis intact without diastasis. Mild chondral thinning of both hip joints. No hip joint effusion. SI  joints and pubic symphysis within normal limits. No bone marrow edema. No marrow replacing bone lesion. Ligaments Intact. Muscles and Tendons The bilateral gluteal, hamstring, iliopsoas, rectus femoris, and adductor tendons appear intact without tear or significant tendinosis. Normal muscle bulk and signal intensity without edema, atrophy, or fatty infiltration. Soft tissues Adjacent to the anterior margin of the right femoral neck is a small nodular focal enhancing lesion measuring 1.9 x 1.1 x 1.4 cm (series 10, image 26; series 11, image 20). Lesion is intermediate to low signal on both T1 and T2 weighted imaging. It is difficult determine whether this lesion is intra-articular or extra-articular on large field-of-view imaging of the pelvis and lack of intra-articular fluid within the right hip joint. No additional enhancing masses are identified. Incidentally noted 2.1 cm subcutaneous cyst along the anterior lower abdominopelvic wall, left of midline, likely sebaceous cyst. Soft tissues are otherwise within normal limits. No soft tissue edema. No pelvic or inguinal lymphadenopathy. IMPRESSION: 1. Adjacent to the anterior margin of the right femoral neck is a small nodular focal enhancing lesion measuring 1.9 x 1.1 x 1.4 cm. It is difficult determine whether this lesion is intra-articular or extra-articular on large field-of-view imaging of the pelvis and lack of intra-articular fluid within the right hip joint. Differential considerations include a small metastatic lesion versus focal synovitis. 2. No acute osseous findings. No evidence of osseous metastatic disease. 3. Mild bilateral hip osteoarthritis. Electronically Signed   By: Davina Poke D.O.   On: 02/02/2021 11:10   DG Chest Port 1 View  Result Date: 02/11/2021 CLINICAL DATA:  Pt c/o chest pain, sternum area, constant pain x 1 day that has since improved, SOB, general weakness that has progressively gotten worse. HX: COPD, lung cancer (left side  mass) diagnosed December 2022. EXAM: PORTABLE CHEST - 1 VIEW COMPARISON:  10/25/2020 FINDINGS: Interval improved aeration in left upper lung. Persistent dense consolidation/atelectasis in the left lower lung with possible associated effusion. The right lung remains clear. Heart size difficult to assess due to adjacent opacities. Aortic Atherosclerosis (ICD10-170.0). No pneumothorax. Vertebral endplate spurring at multiple levels in the lower thoracic spine. IMPRESSION: Dense left lower lung consolidation/atelectasis with possible effusion Electronically Signed   By: Lucrezia Europe M.D.   On: 02/11/2021 13:38   ECHOCARDIOGRAM COMPLETE  Result Date: 02/12/2021    ECHOCARDIOGRAM REPORT   Patient Name:   James [name] Date of Exam: 02/12/2021 Medical Rec #:  [id number]         Height:       73.0 in Accession #:    [id number]        Weight:       201.2 lb Date of Birth:  Dec 13, 1945          BSA:          2.157 m Patient Age:    21 years          BP:           139/61 mmHg Patient Gender: M                 HR:           108 bpm. Exam Location:  Inpatient Procedure: 2D Echo, Cardiac Doppler and Color Doppler Indications:    Aflutter  History:        Patient has prior history of  Echocardiogram examinations, most                 recent 06/06/2020. COPD, Arrythmias:Tachycardia,                 Signs/Symptoms:Chest Pain; Risk Factors:Hypertension and                 Diabetes.  Sonographer:    Glo Herring Referring Phys: [id number] [place]  1. Left ventricular ejection fraction, by estimation, is 55 to 60%. The left ventricle has normal function. The left ventricle has no regional wall motion abnormalities. There is mild concentric left ventricular hypertrophy. Left ventricular diastolic function could not be evaluated.  2. Right ventricular systolic function is normal. The right ventricular size is normal. There is normal pulmonary artery systolic pressure.  3. Left atrial size was mild to moderately  dilated.  4. Right atrial size was mild to moderately dilated.  5. A small pericardial effusion is present. There is no evidence of cardiac tamponade.  6. The mitral valve is normal in structure. No evidence of mitral valve regurgitation. No evidence of mitral stenosis.  7. The aortic valve is grossly normal. Aortic valve regurgitation is not visualized. No aortic stenosis is present.  8. There is mild dilatation of the ascending aorta, measuring 40 mm.  9. The inferior vena cava is dilated in size with >50% respiratory variability, suggesting right atrial pressure of 8 mmHg. Comparison(s): Changes from prior study are noted. Now in atrial flutter. Conclusion(s)/Recommendation(s): Otherwise normal echocardiogram, with minor abnormalities described in the report. In atrial flutter throughout the study. FINDINGS  Left Ventricle: Left ventricular ejection fraction, by estimation, is 55 to 60%. The left ventricle has normal function. The left ventricle has no regional wall motion abnormalities. The left ventricular internal cavity size was normal in size. There is  mild concentric left ventricular hypertrophy. Left ventricular diastolic function could not be evaluated due to nondiagnostic images. Left ventricular diastolic function could not be evaluated. Right Ventricle: The right ventricular size is normal. No increase in right ventricular wall thickness. Right ventricular systolic function is normal. There is normal pulmonary artery systolic pressure. The tricuspid regurgitant velocity is 2.45 m/s, and  with an assumed right atrial pressure of 8 mmHg, the estimated right ventricular systolic pressure is 81.8 mmHg. Left Atrium: Left atrial size was mild to moderately dilated. Right Atrium: Right atrial size was mild to moderately dilated. Pericardium: A small pericardial effusion is present. There is no evidence of cardiac tamponade. Mitral Valve: The mitral valve is normal in structure. No evidence of mitral valve  regurgitation. No evidence of mitral valve stenosis. Tricuspid Valve: The tricuspid valve is normal in structure. Tricuspid valve regurgitation is trivial. No evidence of tricuspid stenosis. Aortic Valve: The aortic valve is grossly normal. Aortic valve regurgitation is not visualized. No aortic stenosis is present. Aortic valve mean gradient measures 4.0 mmHg. Aortic valve peak gradient measures 8.0 mmHg. Aortic valve area, by VTI measures 2.87 cm. Pulmonic Valve: The pulmonic valve was not well visualized. Pulmonic valve regurgitation is not visualized. Aorta: The aortic root, ascending aorta, aortic arch and descending aorta are all structurally normal, with no evidence of dilitation or obstruction. There is mild dilatation of the ascending aorta, measuring 40 mm. Venous: The inferior vena cava is dilated in size with greater than 50% respiratory variability, suggesting right atrial pressure of 8 mmHg. IAS/Shunts: The atrial septum is grossly normal.  LEFT VENTRICLE PLAX 2D LVIDd:  4.70 cm      Diastology LVIDs:         3.20 cm      LV e' medial:    8.49 cm/s LV PW:         1.20 cm      LV E/e' medial:  9.8 LV IVS:        1.20 cm      LV e' lateral:   12.13 cm/s LVOT diam:     2.00 cm      LV E/e' lateral: 6.9 LV SV:         72 LV SV Index:   33 LVOT Area:     3.14 cm  LV Volumes (MOD) LV vol d, MOD A2C: 105.0 ml LV vol d, MOD A4C: 93.3 ml LV vol s, MOD A2C: 46.5 ml LV vol s, MOD A4C: 42.4 ml LV SV MOD A2C:     58.5 ml LV SV MOD A4C:     93.3 ml LV SV MOD BP:      56.7 ml RIGHT VENTRICLE             IVC RV Basal diam:  4.30 cm     IVC diam: 2.50 cm RV S prime:     14.60 cm/s LEFT ATRIUM             Index        RIGHT ATRIUM           Index LA diam:        4.60 cm 2.13 cm/m   RA Area:     28.00 cm LA Vol (A2C):   86.3 ml 40.01 ml/m  RA Volume:   93.50 ml  43.34 ml/m LA Vol (A4C):   56.7 ml 26.29 ml/m LA Biplane Vol: 70.6 ml 32.73 ml/m  AORTIC VALVE                    PULMONIC VALVE AV Area (Vmax):     2.87 cm     PV Vmax:       0.99 m/s AV Area (Vmean):   2.69 cm     PV Peak grad:  3.9 mmHg AV Area (VTI):     2.87 cm AV Vmax:           141.67 cm/s AV Vmean:          94.533 cm/s AV VTI:            0.249 m AV Peak Grad:      8.0 mmHg AV Mean Grad:      4.0 mmHg LVOT Vmax:         129.33 cm/s LVOT Vmean:        80.867 cm/s LVOT VTI:          0.228 m LVOT/AV VTI ratio: 0.91  AORTA Ao Root diam: 4.00 cm Ao Asc diam:  3.40 cm MITRAL VALVE               TRICUSPID VALVE MV Area (PHT): 5.08 cm    TR Peak grad:   24.0 mmHg MV Decel Time: 149 msec    TR Vmax:        245.00 cm/s MV E velocity: 83.13 cm/s                            SHUNTS  Systemic VTI:  0.23 m                            Systemic Diam: 2.00 cm Buford Dresser MD Electronically signed by Buford Dresser MD Signature Date/Time: 02/12/2021/11:39:35 AM    Final    US THYROID  Result Date: 02/15/2021 CLINICAL DATA:  Abnormal TSH, COVID EXAM: THYROID ULTRASOUND TECHNIQUE: Ultrasound examination of the thyroid gland and adjacent soft tissues was performed. COMPARISON:  02/12/2010 and previous FINDINGS: Parenchymal Echotexture: Moderately heterogenous Isthmus: 0.2 cm thickness, previously 0.3 Right lobe: 4.7 x 2.4 x 1.9 cm, previously 5.6 x 2.7 x 2.6 Left lobe: 4.5 x 2.7 x 2.5 cm, previously 5.1 x 2.1 x 2.7 _________________________________________________________ Estimated total number of nodules >/= 1 cm: 3 Number of spongiform nodules >/=  2 cm not described below (TR1): 0 Number of mixed cystic and solid nodules >/= 1.5 cm not described below ([place]): 0 _________________________________________________________ Nodule 1: 1.5 cm complex cyst, mid right. This was previously measured as 3 separate lesions on previous study of 02/15/2009 but in retrospect is unchanged. Stability for greater than 5 years implies benignity. This nodule does NOT meet TI-RADS criteria for biopsy or dedicated follow-up. Nodule 2: 0.7 cm benign  cyst, inferolateral right. Nodule 3: 1 cm hyperechoic nodule, stable since previous. Stability for greater than 5 years implies benignity. This nodule does NOT meet TI-RADS criteria for biopsy or dedicated follow-up. Nodule # 4: Prior biopsy: No Location: Left; inferior Maximum size: 2.1 cm; Other 2 dimensions: 1.7 x 1.8 cm, previously, 1.1 x 0.9 x 0.9 cm Composition: solid/almost completely solid (2) Echogenicity: hypoechoic (2) Shape: not taller-than-wide (0) Margins: ill-defined (0) Echogenic foci: none (0) ACR TI-RADS total points: 4. ACR TI-RADS risk category:  TR 4. Significant change in size (>/= 20% in two dimensions and minimal increase of 2 mm): Yes Change in features: No Change in ACR TI-RADS risk category: No ACR TI-RADS recommendations: **Given size (>/= 1.5 cm) and appearance, fine needle aspiration of this moderately suspicious nodule should be considered based on TI-RADS criteria. Nodule 5: 0.7 cm complex cyst, mid left; This nodule does NOT meet TI-RADS criteria for biopsy or dedicated follow-up. _________________________________________________________ No regional cervical adenopathy identified. IMPRESSION: 1. Borderline thyromegaly with bilateral nodules. 2. Recommend FNA biopsy of enlarging 2.1 cm moderately suspicious inferior left nodule. The above is in keeping with the ACR TI-RADS recommendations - J Am Coll Radiol 2017;14:587-595. Electronically Signed   By: Lucrezia Europe M.D.   On: 02/15/2021 11:01     ASSESSMENT/PLAN:  This is a very pleasant 76 year old Caucasian male with likely stage IIIa/stage IV (T3, N1, M0/M1a) non-small cell lung cancer, squamous cell carcinoma.  He presented with a central area obstructive left lung mass with probable involvement of the left hilar adenopathy and suspicious but not confirmed malignant pleural effusion.  The patient's PD-L1 expression is 15% by guardant 360.  The patient does not have any actionable mutations.  The patient underwent an MRI of the  right hip which was found to show indeterminate small metastatic lesion vs. Focal synovitis. The plan is to continue with treatment as planned with close monitoring of this area.    Mohamed still recommends undergoing giving the patient the benefit of the doubt and undergoing weekly concurrent chemoradiation with carboplatin for AUC of 2 and paclitaxel 45 mg per metered square.  This patient is scheduled to receive his first cycle of treatment today.   Labs were  reviewed.  The patient has baseline microcytic anemia.  We will monitor this closely every week.  I will add a sample to blood bank weekly and arrange for blood transfusion if his hemoglobin drops less than 8.  The patient also takes ferrous gluconate p.o. daily.  He will continue to take that.  He receives [place] for infusions 300 mg weekly x3 every 3 months for iron deficiency.  His last infusion was on 11/12/2020.  I will arrange for him to receive weekly venofer 300 mg x3 starting this week or next week.  I have placed an order to recheck his iron and ferritin in 3 months.  The patient will proceed with cycle #1 today as scheduled.  The patient is interested in receiving a Port-A-Cath placement.  I have placed an order.  I have also sent a prescription for Emla cream to the pharmacy.  For the patient's generalized weakness, this is likely multifactorial due to his anemia, malignancy, COVID-19 infection, and recent hospitalization.  He is going to start physical therapy and Occupational Therapy.  I advised the patient his wife to call us if he needs to be evaluated sooner due to worsening weakness.  He has a cane and a walker for ambulation if needed.  The patient is prescribed tramadol for right-sided chest discomfort.  He is not in any pain at this time.  Unable to take NSAIDs due to chemotherapy and blood thinner use.  Advised the patient to take Tylenol but to ensure he does not exceed the daily dose.  Discussed that I can try sending Norco to  the pharmacy in place of tramadol as that is not effective for him.  The patient has a documented allergy to codeine.  The patient is presently taking Hycodan which contains codeine.  He is tolerating this well.  When I asked the patient what his reaction was, he states he is not sure as this was over 30 years ago.  It is reported his itching.  Discussed with the patient I was hesitant to prescribe Norco given the listed allergy but he states that he would be interested in trying this.  Cautioned him if he develops any itching to discontinue Norco and take Benadryl.  Also cautioned him if he develops any facial swelling/breathing concerns to call 911.   The patient was advised to call immediately if he has any concerning symptoms in the interval. The patient voices understanding of current disease status and treatment options and is in agreement with the current care plan. All questions were answered. The patient knows to call the clinic with any problems, questions or concerns. We can certainly see the patient much sooner if necessary Orders Placed This Encounter  Procedures   IR IMAGING GUIDED PORT INSERTION    Standing Status:   Future    Standing Expiration Date:   02/26/2022    Order Specific Question:   Reason for Exam (SYMPTOM  OR DIAGNOSIS REQUIRED)    Answer:   chemotherapy weekly on mondays. Please schedule on Thursday or Friday    Order Specific Question:   Preferred Imaging Location?    Answer:   [facility]   Iron and Iron Binding [facility]acity ([facility] only)    Standing Status:   Future    Standing Expiration Date:   02/26/2022   Ferritin    Standing Status:   Future    Standing Expiration Date:   02/26/2022   CBC with Differential ([place] Only)    Standing  Status:   Standing    Number of Occurrences:   6    Standing Expiration Date:   02/26/2022   CMP ([place] only)    Standing Status:   Standing    Number of Occurrences:   6    Standing Expiration Date:    02/26/2022   Sample to Blood Bank    Standing Status:   Standing    Number of Occurrences:   6    Standing Expiration Date:   02/26/2022     The total time spent in the appointment was 30-39 minutes.   Undray Allman L Zakhai Meisinger, PA-C 02/26/21

## 2021-02-26 NOTE — Patient Instructions (Signed)
[facility]  Discharge Instructions: Thank you for choosing [facility] to provide your oncology and hematology care.   If you have a lab appointment with the Cancer Center, please go directly to the Cancer Center and check in at the registration area.   Wear comfortable clothing and clothing appropriate for easy access to any Portacath or PICC line.   We strive to give you quality time with your provider. You may need to reschedule your appointment if you arrive late (15 or more minutes).  Arriving late affects you and other patients whose appointments are after yours.  Also, if you miss three or more appointments without notifying the office, you may be dismissed from the clinic at the provider's discretion.      For prescription refill requests, have your pharmacy contact our office and allow 72 hours for refills to be completed.    Today you received the following chemotherapy and/or immunotherapy agents Taxol and Carboplatin       To help prevent nausea and vomiting after your treatment, we encourage you to take your nausea medication as directed.  BELOW ARE SYMPTOMS THAT SHOULD BE REPORTED IMMEDIATELY: *FEVER GREATER THAN 100.4 F (38 C) OR HIGHER *CHILLS OR SWEATING *NAUSEA AND VOMITING THAT IS NOT CONTROLLED WITH YOUR NAUSEA MEDICATION *UNUSUAL SHORTNESS OF BREATH *UNUSUAL BRUISING OR BLEEDING *URINARY PROBLEMS (pain or burning when urinating, or frequent urination) *BOWEL PROBLEMS (unusual diarrhea, constipation, pain near the anus) TENDERNESS IN MOUTH AND THROAT WITH OR WITHOUT PRESENCE OF ULCERS (sore throat, sores in mouth, or a toothache) UNUSUAL RASH, SWELLING OR PAIN  UNUSUAL VAGINAL DISCHARGE OR ITCHING   Items with * indicate a potential emergency and should be followed up as soon as possible or go to the Emergency Department if any problems should occur.  Please show the CHEMOTHERAPY ALERT CARD or IMMUNOTHERAPY ALERT CARD at  check-in to the Emergency Department and triage nurse.  Should you have questions after your visit or need to cancel or reschedule your appointment, please contact [facility]  Dept: [phone]  and follow the prompts.  Office hours are 8:00 a.m. to 4:30 p.m. Monday - Friday. Please note that voicemails left after 4:00 p.m. may not be returned until the following business day.  We are closed weekends and major holidays. You have access to a nurse at all times for urgent questions. Please call the main number to the clinic Dept: [phone] and follow the prompts.   For any non-urgent questions, you may also contact your provider using MyChart. We now offer e-Visits for anyone 18 and older to request care online for non-urgent symptoms. For details visit [email or web address].   Also download the MyChart app! Go to the app store, search "MyChart", open the app, select [facility], and log in with your MyChart username and password.  Due to Covid, a mask is required upon entering the hospital/clinic. If you do not have a mask, one will be given to you upon arrival. For doctor visits, patients may have 1 support person aged 18 or older with them. For treatment visits, patients cannot have anyone with them due to current Covid guidelines and our immunocompromised population.   

## 2021-02-27 ENCOUNTER — Inpatient Hospital Stay: Payer: Medicare Other | Admitting: Physician Assistant

## 2021-02-27 ENCOUNTER — Encounter: Payer: Self-pay | Admitting: Student

## 2021-02-27 ENCOUNTER — Ambulatory Visit
Admission: RE | Admit: 2021-02-27 | Discharge: 2021-02-27 | Disposition: A | Discharge status: Home / Self Care | Payer: Medicare Other | Source: Ambulatory Visit | Attending: Radiation Oncology | Admitting: Radiation Oncology

## 2021-02-27 ENCOUNTER — Ambulatory Visit (INDEPENDENT_AMBULATORY_CARE_PROVIDER_SITE_OTHER): Payer: Medicare Other | Admitting: Student

## 2021-02-27 VITALS — BP 127/79 | HR 130 | Resp 20 | Ht 73.0 in | Wt 199.0 lb

## 2021-02-27 DIAGNOSIS — E118 Type 2 diabetes mellitus with unspecified complications: Secondary | ICD-10-CM

## 2021-02-27 DIAGNOSIS — I2510 Atherosclerotic heart disease of native coronary artery without angina pectoris: Secondary | ICD-10-CM | POA: Diagnosis not present

## 2021-02-27 DIAGNOSIS — Z510 Encounter for antineoplastic radiation therapy: Secondary | ICD-10-CM | POA: Diagnosis not present

## 2021-02-27 DIAGNOSIS — I10 Essential (primary) hypertension: Secondary | ICD-10-CM

## 2021-02-27 DIAGNOSIS — J439 Emphysema, unspecified: Secondary | ICD-10-CM | POA: Diagnosis not present

## 2021-02-27 DIAGNOSIS — I4892 Unspecified atrial flutter: Secondary | ICD-10-CM | POA: Diagnosis not present

## 2021-02-27 DIAGNOSIS — C3490 Malignant neoplasm of unspecified part of unspecified bronchus or lung: Secondary | ICD-10-CM | POA: Diagnosis not present

## 2021-02-27 DIAGNOSIS — Z87891 Personal history of nicotine dependence: Secondary | ICD-10-CM | POA: Diagnosis not present

## 2021-02-27 DIAGNOSIS — E785 Hyperlipidemia, unspecified: Secondary | ICD-10-CM

## 2021-02-27 DIAGNOSIS — C3432 Malignant neoplasm of lower lobe, left bronchus or lung: Secondary | ICD-10-CM | POA: Diagnosis not present

## 2021-02-27 MED ORDER — METOPROLOL TARTRATE 25 MG PO TABS: 25.0000 mg | ORAL_TABLET | Freq: Two times a day (BID) | ORAL | 3 refills | Status: DC

## 2021-02-27 NOTE — Patient Instructions (Addendum)
Medication Instructions:  INCREASE Metoprolol Tartrate (Lopressor) to 25 mg 2 times a day  *If you need a refill on your cardiac medications before your next appointment, please call your pharmacy*  Lab Work: NONE ordered at this time of appointment   If you have labs (blood work) drawn today and your tests are completely normal, you will receive your results only by: [place] (if you have MyChart) OR A paper copy in the mail If you have any lab test that is abnormal or we need to change your treatment, we will call you to review the results.  Testing/Procedures: Your physician has recommended that you have a Cardioversion (DCCV). Electrical Cardioversion uses a jolt of electricity to your heart either through paddles or wired patches attached to your chest. This is a controlled, usually prescheduled, procedure. Defibrillation is done under light anesthesia in the hospital, and you usually go home the day of the procedure. This is done to get your heart back into a normal rhythm. You are not awake for the procedure. Please see the instruction sheet given to you today.  Scheduled for Monday 03/04/21 with Dr. Johney Frame at 10 AM   Follow-Up: At [facility], you and your health needs are our priority.  As part of our continuing mission to provide you with exceptional heart care, we have created designated Provider Care Teams.  These Care Teams include your primary Cardiologist (physician) and Advanced Practice Providers (APPs -  Physician Assistants and Nurse Practitioners) who all work together to provide you with the care you need, when you need it.  Your next appointment:   3 month(s)  The format for your next appointment:   In Person  Provider:   [name], MD  or  APP         Other Instructions  Dear James [name],  You are scheduled for a TEE/Cardioversion on Monday 03/04/21.  with Dr. Johney Frame.  Please arrive at the [facility] (Main Entrance A) at [facility]: [address] [place], [place] [address] at 9:00 AM.)  DIET: Nothing to eat or drink after midnight except a sip of water with medications (see medication instructions below)  FYI: For your safety, and to allow [place] to monitor your vital signs accurately during the surgery/procedure we request that   if you have artificial nails, gel coating, SNS etc. Please have those removed prior to your surgery/procedure. Not having the nail coverings /polish removed may result in cancellation or delay of your surgery/procedure.  Medication Instructions: Continue your anticoagulant: Eliquis (Apixaban) You will need to continue your anticoagulant after your procedure until you  are told by your  Provider that it is safe to stop   You must have a responsible person to drive you home and stay in the waiting area during your procedure. Failure to do so could result in cancellation.  Bring your insurance cards.  *Special Note: Every effort is made to have your procedure done on time. Occasionally there are emergencies that occur at the hospital that may cause delays. Please be patient if a delay does occur.

## 2021-02-27 NOTE — Progress Notes (Signed)
Cardiology Office Note:    Date:  02/27/2021   ID:  James [name], DOB 07/04/1945, MRN [id number]  PCP:  [name], MD  Cardiologist:  [name], MD  Electrophysiologist:  None   Referring MD: [name], MD   Chief Complaint: hospital follow-up of atrial flutter  History of Present Illness:    James [name] is a 76 y.o. male with a history of not CAD on cardiac catheterization in 12/2020, recently diagnosed atrial flutter on Eliquis, recurrent exudative pleural effusion s/p thoracentesis x2, hypertension, hyperlipidemia, type 2 diabetes, COPD, iron deficiency anemia anemia, Parkinson's disease, and recently diagnosed stage III lung cancer who presents today for hospital follow-up of atrial flutter.  Patient was referred to Dr. [name] in 09/2020 for further evaluation of exertional chest pain.  He was started on Imdur with some improvement but not complete resolution.  LHC in 12/2020 showed 50% stenosis of mid to distal left main that was not hemodynamically significant by flow wire assessment and 25% stenosis of proximal to mid RCA.  Medical therapy was recommended. Following this, he had a biopsy of a lung mass and was found to have stage III lung cancer. He has started radiation and chemotherapy.  Patient was recently admitted 02/11/2021 to 02/16/2021 for new onset atrial flutter with rates as high as the 160s.  Patient relatively asymptomatic with this at the time and this was discovered when he was noted to be tachycardic at an oncology appointment.  In the ED, he was also found to have COVID.  Showed LVEF of 55 to 60% with normal wall motion and mild LVH, normal RV, via bilateral atrial enlargement, small moderate pericardial effusion, and mild dilatation of the ascending aorta measuring 40 mm.  He was initially started on IV Cardizem and IV heparin and then transition to p.o. Cardizem, Lopressor, and Eliquis.  Plan was for follow-up in the atrial fibrillation clinic  with consideration of cardioversion after 3 weeks of therapeutic anticoagulation if atrial flutter persists.  DCCV was not pursued during admission due to acute COVID infection.  Patient presents today for follow-up.  Here with wife who is a former Marine scientist.  He is in atrial flutter with rate of 130 today.  However, he is asymptomatic with this.  He denies any palpitations.  He describes some lightheadedness if he changes positions too quickly but otherwise no lightheadedness, dizziness, near syncope/syncope.  He continues to have the same chest pain that he has had since he was first seen by Cardiology but this is stable. The pain starts in his shoulder blades and radiates anteriorly to his chest.  It occurs at rest is not worse with exertion (although he is not very active at this time).  This has been felt to be due to his lung mass/cancer.  He states his breathing is stable.  He is on 4 to 6 L of supplemental O2 via nasal cannula 24/7 at home but denies any significant shortness of breath follow-up on this.  No orthopnea, PND, lower extremity edema.  He denies any abnormal bleeding in his urine or stools on the Eliquis.  His wife does report that he missed 1 dose of Eliquis shortly after discharge.  In regards to his lung cancer, he is currently receiving radiation and should finish this on 03/25/2021.  He also recently started chemotherapy and will be receiving this every 2 weeks through February.  Of note, Oncology would like to place a Port-A-Cath soon.  Past Medical History:  Diagnosis Date   Allergy    seasonal   Blind left eye    legally   Blood transfusion    2002   COPD (chronic obstructive pulmonary disease) (HCC)    Depression    Diabetes mellitus, type 2 (HCC)    Diverticulosis    Dyspnea    ED (erectile dysfunction)    Glaucoma    Hyperlipidemia    Hypertension    Insomnia    Internal and external bleeding hemorrhoids    Iron deficiency anemia    Osteoarthritis    Parkinson's  disease ([place]) 2014   Dr. [name] PCP  and Dr. [name] at [facility]   Rosacea    Tremor of both hands    Tubular adenoma of colon 03/28/2011    Past Surgical History:  Procedure Laterality Date   ARTHROSCOPIC REPAIR ACL     BALLOON DILATION  01/28/2021   Procedure: BALLOON DILATION;  Surgeon: [name], DO;  Location: [place] [facility];  Service: Pulmonary;;   BRONCHIAL BIOPSY  01/28/2021   Procedure: BRONCHIAL BIOPSIES;  Surgeon: [name], DO;  Location: [place];  Service: Pulmonary;;   [place]   right   COLONOSCOPY WITH PROPOFOL N/A 07/25/2015   Procedure: COLONOSCOPY WITH PROPOFOL;  Surgeon: [name], MD;  Location: [facility];  Service: Endoscopy;  Laterality: N/A;   CRYOTHERAPY  01/28/2021   Procedure: CRYOTHERAPY;  Surgeon: [name], DO;  Location: [place] [facility];  Service: Pulmonary;;   ESOPHAGOGASTRODUODENOSCOPY (EGD) WITH PROPOFOL N/A 07/25/2015   Procedure: ESOPHAGOGASTRODUODENOSCOPY (EGD) WITH PROPOFOL;  Surgeon: [name], MD;  Location: [facility];  Service: Endoscopy;  Laterality: N/A;   HEMOSTASIS CONTROL  01/28/2021   Procedure: HEMOSTASIS CONTROL;  Surgeon: [name], DO;  Location: [place];  Service: Pulmonary;;   INTRAVASCULAR PRESSURE WIRE/FFR STUDY N/A 01/22/2021   Procedure: INTRAVASCULAR PRESSURE WIRE/FFR STUDY;  Surgeon: [name], Peter M, MD;  Location: [place] [facility];  Service: Cardiovascular;  Laterality: N/A;   IR THORACENTESIS ASP PLEURAL SPACE W/IMG GUIDE  09/17/2020   IR THORACENTESIS ASP PLEURAL SPACE W/IMG GUIDE  10/25/2020   KNEE ARTHROSCOPY  1996   LEFT HEART CATH AND CORONARY ANGIOGRAPHY N/A 01/22/2021   Procedure: LEFT HEART CATH AND CORONARY ANGIOGRAPHY;  Surgeon: [name], Peter M, MD;  Location: [place] [facility];  Service: Cardiovascular;  Laterality: N/A;   PARTIAL COLECTOMY  2008   THYROID CYST EXCISION     TONSILLECTOMY     VIDEO BRONCHOSCOPY Left 01/28/2021    Procedure: VIDEO BRONCHOSCOPY WITHOUT FLUORO;  Surgeon: [name], DO;  Location: [place];  Service: Pulmonary;  Laterality: Left;    Current Medications: Current Meds  Medication Sig   apixaban (ELIQUIS) 5 MG TABS tablet Take 1 tablet (5 mg total) by mouth 2 (two) times daily.   atorvastatin (LIPITOR) 20 MG tablet Take 20 mg by mouth at bedtime.    buPROPion (WELLBUTRIN XL) 300 MG 24 hr tablet Take 150 mg by mouth 2 (two) times daily.   calcium-vitamin D (OSCAL WITH D) 250-125 MG-UNIT per tablet Take 1 tablet by mouth daily.   carbidopa-levodopa (SINEMET IR) 25-100 MG tablet TAKE 1.5 TABLETS BY MOUTH 3 TIMES DAILY. (Patient taking differently: Take 1.5 tablets by mouth 3 (three) times daily.)   cetirizine (ZYRTEC) 10 MG tablet Take 10 mg by mouth 2 (two) times daily.   diltiazem (CARDIZEM CD) 300 MG 24 hr capsule Take  1 capsule (300 mg total) by mouth daily.   donepezil (ARICEPT) 10 MG tablet Take 1 tablet (10 mg total) by mouth at bedtime.   ferrous gluconate (FERGON) 324 MG tablet TAKE 1 TABLET BY MOUTH DAILY WITH BREAKFAST (Patient taking differently: 324 mg daily with breakfast.)   formoterol (PERFOROMIST) 20 MCG/2ML nebulizer solution Take 2 mLs (20 mcg total) by nebulization 2 (two) times daily. (Patient taking differently: Take 20 mcg by nebulization daily as needed (cough).)   Green Tea 315 MG CAPS Take 315 mg by mouth daily.   HYDROcodone-acetaminophen (NORCO) 5-325 MG tablet Take 1 tablet by mouth every 6 (six) hours as needed for moderate pain.   ibuprofen (ADVIL) 800 MG tablet Take 1 tablet (800 mg total) by mouth every 8 (eight) hours as needed for moderate pain.   lidocaine-prilocaine (EMLA) cream Apply 1 application topically as needed.   MEGARED OMEGA-3 KRILL OIL PO Take 1 capsule by mouth daily.   metFORMIN (GLUCOPHAGE) 500 MG tablet Take 1 tablet (500 mg total) by mouth 2 (two) times daily with a meal.   mirtazapine (REMERON) 15 MG tablet Take 15 mg by mouth at  bedtime.   modafinil (PROVIGIL) 100 MG tablet Take 1 tablet (100 mg total) by mouth daily.   Multiple Vitamins-Minerals (MULTIVITAMIN WITH MINERALS) tablet Take 1 tablet by mouth daily. Centrum   omeprazole (PRILOSEC OTC) 20 MG tablet Take 20 mg by mouth every evening.   OXYGEN Inhale 4-6 L into the lungs continuous.   prochlorperazine (COMPAZINE) 10 MG tablet Take 1 tablet (10 mg total) by mouth every 6 (six) hours as needed for nausea or vomiting.   revefenacin (YUPELRI) 175 MCG/3ML nebulizer solution Take 3 mLs (175 mcg total) by nebulization daily.   sertraline (ZOLOFT) 100 MG tablet Take 150 mg by mouth daily.   traMADol (ULTRAM) 50 MG tablet Take 1 tablet (50 mg total) by mouth every 6 (six) hours as needed. (Patient taking differently: Take 50 mg by mouth every 6 (six) hours as needed for moderate pain.)   [DISCONTINUED] metoprolol tartrate (LOPRESSOR) 25 MG tablet Take 0.5 tablets (12.5 mg total) by mouth 2 (two) times daily. Hold if heart rate less than 60 or blood pressure less than 100/60     Allergies:   Aspirin, Codeine, and Morphine   Social History   Socioeconomic History   Marital status: Married    Spouse name: Not on file   Number of children: 1   Years of education: HS   Highest education level: Not on file  Occupational History   Occupation: retired    Fish farm manager: RETIRED  Tobacco Use   Smoking status: Former    Packs/day: 1.50    Years: 50.00    Pack years: 75.00    Types: Cigarettes    Quit date: 04/30/2015    Years since quitting: 5.8   Smokeless tobacco: Never   Tobacco comments:    States he sometimes sneaks a cigarette  Vaping Use   Vaping Use: Never used  Substance and Sexual Activity   Alcohol use: Not Currently    Comment: once yearly   Drug use: No   Sexual activity: Not on file  Other Topics Concern   Not on file  Social History Narrative   Drinks 1-2 Pepsi a day    Social Determinants of Health   Financial Resource Strain: Not on file   Food Insecurity: Not on file  Transportation Needs: Not on file  Physical Activity: Not on file  Stress: Not on file  Social Connections: Not on file     Family History: The patient's family history includes Alcohol abuse in his father; Asthma in his mother; CAD in his father; COPD in his mother; Cancer in his maternal grandmother; Emphysema in his mother; Heart disease in his father. There is no history of Colon cancer.  ROS:   Please see the history of present illness.     EKGs/Labs/Other Studies Reviewed:    The following studies were reviewed today:  Left Cardiac Catheterization 01/22/2021:   Mid LM to Dist LM lesion is 50% stenosed.   Prox RCA to Mid RCA lesion is 25% stenosed.   The left ventricular systolic function is normal.   LV end diastolic pressure is normal.   The left ventricular ejection fraction is 55-65% by visual estimate.   Nonobstructive CAD. There is a 50% distal left main stenosis but this is not hemodynamically significant by flow wire assessment. Normal LV function Normal LVEDP   Plan: patient is cleared to proceed with evaluation and treatment of his lung mass. Risk factor modification.   Diagnostic Dominance: Right  _______________  Echocardiogram 02/12/2021: Impressions:  1. Left ventricular ejection fraction, by estimation, is 55 to 60%. The  left ventricle has normal function. The left ventricle has no regional  wall motion abnormalities. There is mild concentric left ventricular  hypertrophy. Left ventricular diastolic  function could not be evaluated.   2. Right ventricular systolic function is normal. The right ventricular  size is normal. There is normal pulmonary artery systolic pressure.   3. Left atrial size was mild to moderately dilated.   4. Right atrial size was mild to moderately dilated.   5. A small pericardial effusion is present. There is no evidence of  cardiac tamponade.   6. The mitral valve is normal in structure. No  evidence of mitral valve  regurgitation. No evidence of mitral stenosis.   7. The aortic valve is grossly normal. Aortic valve regurgitation is not  visualized. No aortic stenosis is present.   8. There is mild dilatation of the ascending aorta, measuring 40 mm.   9. The inferior vena cava is dilated in size with >50% respiratory  variability, suggesting right atrial pressure of 8 mmHg.   Comparison(s): Changes from prior study are noted. Now in atrial flutter.   Conclusion(s)/Recommendation(s): Otherwise normal echocardiogram, with  minor abnormalities described in the report. In atrial flutter throughout  the study.    EKG:  EKG ordered today. EKG personally reviewed and demonstrates a flutter, rate 130 bpm, with variable AV block and mild ST depressions in inferior leads and leads V3-V6 (likely rate related).  Recent Labs: 02/16/2021: Magnesium 2.4 02/18/2021: B Natriuretic Peptide 91.6; TSH 1.111 02/26/2021: ALT <5; BUN 16; Creatinine 0.80; Hemoglobin 8.7; Platelet Count 340; Potassium 3.7; Sodium 140  Recent Lipid Panel No results found for: CHOL, TRIG, HDL, CHOLHDL, VLDL, LDLCALC, LDLDIRECT  Physical Exam:    Vital Signs: BP 127/79    Pulse (!) 130    Resp 20    Ht 6\' 1"  (1.854 m)    Wt 199 lb (90.3 kg)    SpO2 93%    BMI 26.25 kg/m     Wt Readings from Last 3 Encounters:  02/27/21 199 lb (90.3 kg)  02/22/21 199 lb (90.3 kg)  02/21/21 199 lb (90.3 kg)     General: 76 y.o. Caucasian male in no acute distress. On home O2 via nasal cannula. HEENT: Normocephalic and atraumatic.  Sclera clear.  Neck: Supple. No carotid bruits. No JVD. Heart: Tachycardic with irregular rhythm.  Distinct S1 and S2. No murmurs, gallops, or rubs. Radial pulses 2+ and equal bilaterally. Lungs: No increased work of breathing. Course breath sounds throughout with diffuse rhonchi but no significant wheezes or crackles appreciated. Abdomen: Soft, non-distended, and non-tender to palpation. Extremities:  No lower extremity edema.    Skin: Warm and dry. Neuro: Alert and oriented x3. No focal deficits. Psych: Normal affect. Responds appropriately.  Assessment:    1. Atrial flutter with rapid ventricular response ([place])   2. Coronary artery disease involving native coronary artery of native heart without angina pectoris   3. Primary hypertension   4. Hyperlipidemia, unspecified hyperlipidemia type   5. Type 2 diabetes mellitus with complication, without long-term current use of insulin (HCC)   6. Pulmonary emphysema, unspecified emphysema type ([place])   7. Malignant neoplasm of lung, unspecified laterality, unspecified part of lung ([place])     Plan:    Atrial Flutter with RVR Patient recently admitted with new onset atrial flutter. Echo showed normal LV function. He also tested positive for COVID that admission so plan was for outpatient cardiac catheterization as outpatient after 3 weeks of uninterrupted anticoagulation. - Still in atrial flutter today with rates in the 130s. Asymptomatic with this.  - Will increase Lopressor to 25mg  twice daily. Patient has some soft BP at times so may be difficult to increase this more. - Continue PO Cardizem 300mg  daily. - Continue Eliquis 5mg  twice daily. - Patient states he missed 1 dose of Eliquis shortly after recent discharge. Patient is currently asymptomatic but I worry that he will develop tachymediated cardiomyopathy if he remains in rapid atrial flutter. Suspect rate control will be difficult especially with soft BP at time. Therefore, recommend TEE/DCCV as soon as possible. Discussed with DOD (Dr. Harl Bowie) who agreed with this plan.   Patient scheduled to TEE/DCCV with Dr. Johney Frame on 03/04/2021. Will place ordered. Emphasized that if patient develops CHF symptoms or lightheadedness/dizziness/near syncope prior to this, he will need to go to the ED.   Shared Decision Making/Informed Consent{ The risks [stroke, cardiac arrhythmias rarely resulting in  the need for a temporary or permanent pacemaker, skin irritation or burns, esophageal damage, perforation (1:10,000 risk), bleeding, pharyngeal hematoma as well as other potential complications associated with conscious sedation including aspiration, arrhythmia, respiratory failure and death], benefits (treatment guidance, restoration of normal sinus rhythm, diagnostic support) and alternatives of a transesophageal echocardiogram guided cardioversion were discussed in detail with Mr. Monarch and he is willing to proceed.  CAD Recent LHC in 12/2020 showed non-obstructive CAD. He did have 50% stenosis of mid to distal left main but this was not hemodynamically significant by flow wire assessment. - He has chronic chest pain that is stable and has been felt to be due to his lung cancer. - No Aspirin due to need for Eliquis. - Continue statin.  Hypertension BP well controlled. - Continue Cardizem CD 300mg  daily. - Will increase Lopressor as above for additional rate control.  Hyperlipidemia Lipid panel in 11/2020 (per [facility]): Total Cholesterol 98, Triglycerides 138, HDL 38, LDL 30.  - Continue Lipitor 20mg  daily.   Type 2 Diabetes Mellitus Hemoglobin A1c 5.8 in 01/2021.  - Management per PCP.  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 Currently receiving radiation and chemotherapy for his lung cancer.  - Followed by Pulmonology and HemOnc.  Disposition: He already has a follow-up in the A.Fib Clinic scheduled for 03/14/2021. Recommend keeping this and will arrange follow-up in our office in 3 months.    Medication Adjustments/Labs and Tests Ordered: Current medicines are reviewed at length with the patient today.  Concerns regarding medicines are outlined above.  Orders Placed This Encounter  Procedures   EKG 12-Lead   Meds ordered this encounter  Medications   metoprolol tartrate (LOPRESSOR) 25 MG tablet     Sig: Take 1 tablet (25 mg total) by mouth 2 (two) times daily. Hold if heart rate less than 60 or blood pressure top number is less than 95    Dispense:  180 tablet    Refill:  3    Patient Instructions  Medication Instructions:  INCREASE Metoprolol Tartrate (Lopressor) to 25 mg 2 times a day  *If you need a refill on your cardiac medications before your next appointment, please call your pharmacy*  Lab Work: NONE ordered at this time of appointment   If you have labs (blood work) drawn today and your tests are completely normal, you will receive your results only by: [place] (if you have MyChart) OR A paper copy in the mail If you have any lab test that is abnormal or we need to change your treatment, we will call you to review the results.  Testing/Procedures: Your physician has recommended that you have a Cardioversion (DCCV). Electrical Cardioversion uses a jolt of electricity to your heart either through paddles or wired patches attached to your chest. This is a controlled, usually prescheduled, procedure. Defibrillation is done under light anesthesia in the hospital, and you usually go home the day of the procedure. This is done to get your heart back into a normal rhythm. You are not awake for the procedure. Please see the instruction sheet given to you today.  Scheduled for Monday 03/04/21 with Dr. Johney Frame at 10 AM   Follow-Up: At [facility], you and your health needs are our priority.  As part of our continuing mission to provide you with exceptional heart care, we have created designated Provider Care Teams.  These Care Teams include your primary Cardiologist (physician) and Advanced Practice Providers (APPs -  Physician Assistants and Nurse Practitioners) who all work together to provide you with the care you need, when you need it.  Your next appointment:   3 month(s)  The format for your next appointment:   In Person  Provider:   [name], MD  or   APP         Other Instructions  Dear [name],  You are scheduled for a TEE/Cardioversion on Monday 03/04/21.  with Dr. Johney Frame.  Please arrive at the [facility] (Main Entrance A) at [facility]: [address] [place], [place] [address] at 9:00 AM.)  DIET: Nothing to eat or drink after midnight except a sip of water with medications (see medication instructions below)  FYI: For your safety, and to allow [place] to monitor your vital signs accurately during the surgery/procedure we request that   if you have artificial nails, gel coating, SNS etc. Please have those removed prior to your surgery/procedure. Not having the nail coverings /polish removed may result in cancellation or delay of your surgery/procedure.  Medication Instructions: Continue your anticoagulant: Eliquis (Apixaban) You will need to continue your anticoagulant after your procedure until you  are told by your  Provider that it is safe to stop   You must have a  responsible person to drive you home and stay in the waiting area during your procedure. Failure to do so could result in cancellation.  Bring your insurance cards.  *Special Note: Every effort is made to have your procedure done on time. Occasionally there are emergencies that occur at the hospital that may cause delays. Please be patient if a delay does occur.      Signed, Darreld Mclean, PA-C  02/27/2021 2:37 PM    [facility]

## 2021-02-27 NOTE — H&P (View-Only) (Signed)
Cardiology Office Note:    Date:  02/27/2021   ID:  James [name], DOB 1945-03-13, MRN [id number]  PCP:  [name], MD  Cardiologist:  [name], MD  Electrophysiologist:  None   Referring MD: [name], MD   Chief Complaint: hospital follow-up of atrial flutter  History of Present Illness:    James [name] is a 76 y.o. male with a history of not CAD on cardiac catheterization in 12/2020, recently diagnosed atrial flutter on Eliquis, recurrent exudative pleural effusion s/p thoracentesis x2, hypertension, hyperlipidemia, type 2 diabetes, COPD, iron deficiency anemia anemia, Parkinson's disease, and recently diagnosed stage III lung cancer who presents today for hospital follow-up of atrial flutter.  Patient was referred to Dr. [name] in 09/2020 for further evaluation of exertional chest pain.  He was started on Imdur with some improvement but not complete resolution.  LHC in 12/2020 showed 50% stenosis of mid to distal left main that was not hemodynamically significant by flow wire assessment and 25% stenosis of proximal to mid RCA.  Medical therapy was recommended. Following this, he had a biopsy of a lung mass and was found to have stage III lung cancer. He has started radiation and chemotherapy.  Patient was recently admitted 02/11/2021 to 02/16/2021 for new onset atrial flutter with rates as high as the 160s.  Patient relatively asymptomatic with this at the time and this was discovered when he was noted to be tachycardic at an oncology appointment.  In the ED, he was also found to have COVID.  Showed LVEF of 55 to 60% with normal wall motion and mild LVH, normal RV, via bilateral atrial enlargement, small moderate pericardial effusion, and mild dilatation of the ascending aorta measuring 40 mm.  He was initially started on IV Cardizem and IV heparin and then transition to p.o. Cardizem, Lopressor, and Eliquis.  Plan was for follow-up in the atrial fibrillation clinic  with consideration of cardioversion after 3 weeks of therapeutic anticoagulation if atrial flutter persists.  DCCV was not pursued during admission due to acute COVID infection.  Patient presents today for follow-up.  Here with wife who is a former Marine scientist.  He is in atrial flutter with rate of 130 today.  However, he is asymptomatic with this.  He denies any palpitations.  He describes some lightheadedness if he changes positions too quickly but otherwise no lightheadedness, dizziness, near syncope/syncope.  He continues to have the same chest pain that he has had since he was first seen by Cardiology but this is stable. The pain starts in his shoulder blades and radiates anteriorly to his chest.  It occurs at rest is not worse with exertion (although he is not very active at this time).  This has been felt to be due to his lung mass/cancer.  He states his breathing is stable.  He is on 4 to 6 L of supplemental O2 via nasal cannula 24/7 at home but denies any significant shortness of breath follow-up on this.  No orthopnea, PND, lower extremity edema.  He denies any abnormal bleeding in his urine or stools on the Eliquis.  His wife does report that he missed 1 dose of Eliquis shortly after discharge.  In regards to his lung cancer, he is currently receiving radiation and should finish this on 03/25/2021.  He also recently started chemotherapy and will be receiving this every 2 weeks through February.  Of note, Oncology would like to place a Port-A-Cath soon.  Past Medical History:  Diagnosis Date   Allergy    seasonal   Blind left eye    legally   Blood transfusion    2002   COPD (chronic obstructive pulmonary disease) (HCC)    Depression    Diabetes mellitus, type 2 (HCC)    Diverticulosis    Dyspnea    ED (erectile dysfunction)    Glaucoma    Hyperlipidemia    Hypertension    Insomnia    Internal and external bleeding hemorrhoids    Iron deficiency anemia    Osteoarthritis    Parkinson's  disease ([place]) 2014   Dr. [name] PCP  and Dr. [name] at [facility]   Rosacea    Tremor of both hands    Tubular adenoma of colon 03/28/2011    Past Surgical History:  Procedure Laterality Date   ARTHROSCOPIC REPAIR ACL     BALLOON DILATION  01/28/2021   Procedure: BALLOON DILATION;  Surgeon: [name], DO;  Location: [facility];  Service: Pulmonary;;   BRONCHIAL BIOPSY  01/28/2021   Procedure: BRONCHIAL BIOPSIES;  Surgeon: [name], DO;  Location: [facility];  Service: Pulmonary;;   [place]   right   COLONOSCOPY WITH PROPOFOL N/A 07/25/2015   Procedure: COLONOSCOPY WITH PROPOFOL;  Surgeon: [name], MD;  Location: [facility];  Service: Endoscopy;  Laterality: N/A;   CRYOTHERAPY  01/28/2021   Procedure: CRYOTHERAPY;  Surgeon: [name], DO;  Location: [facility];  Service: Pulmonary;;   ESOPHAGOGASTRODUODENOSCOPY (EGD) WITH PROPOFOL N/A 07/25/2015   Procedure: ESOPHAGOGASTRODUODENOSCOPY (EGD) WITH PROPOFOL;  Surgeon: [name], MD;  Location: [facility];  Service: Endoscopy;  Laterality: N/A;   HEMOSTASIS CONTROL  01/28/2021   Procedure: HEMOSTASIS CONTROL;  Surgeon: [name], DO;  Location: [facility];  Service: Pulmonary;;   INTRAVASCULAR PRESSURE WIRE/FFR STUDY N/A 01/22/2021   Procedure: INTRAVASCULAR PRESSURE WIRE/FFR STUDY;  Surgeon: [name], Peter M, MD;  Location: [facility];  Service: Cardiovascular;  Laterality: N/A;   IR THORACENTESIS ASP PLEURAL SPACE W/IMG GUIDE  09/17/2020   IR THORACENTESIS ASP PLEURAL SPACE W/IMG GUIDE  10/25/2020   KNEE ARTHROSCOPY  1996   LEFT HEART CATH AND CORONARY ANGIOGRAPHY N/A 01/22/2021   Procedure: LEFT HEART CATH AND CORONARY ANGIOGRAPHY;  Surgeon: [name], Peter M, MD;  Location: [facility];  Service: Cardiovascular;  Laterality: N/A;   PARTIAL COLECTOMY  2008   THYROID CYST EXCISION     TONSILLECTOMY     VIDEO BRONCHOSCOPY Left 01/28/2021    Procedure: VIDEO BRONCHOSCOPY WITHOUT FLUORO;  Surgeon: [name], DO;  Location: [facility];  Service: Pulmonary;  Laterality: Left;    Current Medications: Current Meds  Medication Sig   apixaban (ELIQUIS) 5 MG TABS tablet Take 1 tablet (5 mg total) by mouth 2 (two) times daily.   atorvastatin (LIPITOR) 20 MG tablet Take 20 mg by mouth at bedtime.    buPROPion (WELLBUTRIN XL) 300 MG 24 hr tablet Take 150 mg by mouth 2 (two) times daily.   calcium-vitamin D (OSCAL WITH D) 250-125 MG-UNIT per tablet Take 1 tablet by mouth daily.   carbidopa-levodopa (SINEMET IR) 25-100 MG tablet TAKE 1.5 TABLETS BY MOUTH 3 TIMES DAILY. (Patient taking differently: Take 1.5 tablets by mouth 3 (three) times daily.)   cetirizine (ZYRTEC) 10 MG tablet Take 10 mg by mouth 2 (two) times daily.   diltiazem (CARDIZEM CD) 300 MG 24 hr capsule Take  1 capsule (300 mg total) by mouth daily.   donepezil (ARICEPT) 10 MG tablet Take 1 tablet (10 mg total) by mouth at bedtime.   ferrous gluconate (FERGON) 324 MG tablet TAKE 1 TABLET BY MOUTH DAILY WITH BREAKFAST (Patient taking differently: 324 mg daily with breakfast.)   formoterol (PERFOROMIST) 20 MCG/2ML nebulizer solution Take 2 mLs (20 mcg total) by nebulization 2 (two) times daily. (Patient taking differently: Take 20 mcg by nebulization daily as needed (cough).)   Green Tea 315 MG CAPS Take 315 mg by mouth daily.   HYDROcodone-acetaminophen (NORCO) 5-325 MG tablet Take 1 tablet by mouth every 6 (six) hours as needed for moderate pain.   ibuprofen (ADVIL) 800 MG tablet Take 1 tablet (800 mg total) by mouth every 8 (eight) hours as needed for moderate pain.   lidocaine-prilocaine (EMLA) cream Apply 1 application topically as needed.   MEGARED OMEGA-3 KRILL OIL PO Take 1 capsule by mouth daily.   metFORMIN (GLUCOPHAGE) 500 MG tablet Take 1 tablet (500 mg total) by mouth 2 (two) times daily with a meal.   mirtazapine (REMERON) 15 MG tablet Take 15 mg by mouth at  bedtime.   modafinil (PROVIGIL) 100 MG tablet Take 1 tablet (100 mg total) by mouth daily.   Multiple Vitamins-Minerals (MULTIVITAMIN WITH MINERALS) tablet Take 1 tablet by mouth daily. Centrum   omeprazole (PRILOSEC OTC) 20 MG tablet Take 20 mg by mouth every evening.   OXYGEN Inhale 4-6 L into the lungs continuous.   prochlorperazine (COMPAZINE) 10 MG tablet Take 1 tablet (10 mg total) by mouth every 6 (six) hours as needed for nausea or vomiting.   revefenacin (YUPELRI) 175 MCG/3ML nebulizer solution Take 3 mLs (175 mcg total) by nebulization daily.   sertraline (ZOLOFT) 100 MG tablet Take 150 mg by mouth daily.   traMADol (ULTRAM) 50 MG tablet Take 1 tablet (50 mg total) by mouth every 6 (six) hours as needed. (Patient taking differently: Take 50 mg by mouth every 6 (six) hours as needed for moderate pain.)   [DISCONTINUED] metoprolol tartrate (LOPRESSOR) 25 MG tablet Take 0.5 tablets (12.5 mg total) by mouth 2 (two) times daily. Hold if heart rate less than 60 or blood pressure less than 100/60     Allergies:   Aspirin, Codeine, and Morphine   Social History   Socioeconomic History   Marital status: Married    Spouse name: Not on file   Number of children: 1   Years of education: HS   Highest education level: Not on file  Occupational History   Occupation: retired    Fish farm manager: RETIRED  Tobacco Use   Smoking status: Former    Packs/day: 1.50    Years: 50.00    Pack years: 75.00    Types: Cigarettes    Quit date: 04/30/2015    Years since quitting: 5.8   Smokeless tobacco: Never   Tobacco comments:    States he sometimes sneaks a cigarette  Vaping Use   Vaping Use: Never used  Substance and Sexual Activity   Alcohol use: Not Currently    Comment: once yearly   Drug use: No   Sexual activity: Not on file  Other Topics Concern   Not on file  Social History Narrative   Drinks 1-2 Pepsi a day    Social Determinants of Health   Financial Resource Strain: Not on file   Food Insecurity: Not on file  Transportation Needs: Not on file  Physical Activity: Not on file  Stress: Not on file  Social Connections: Not on file     Family History: The patient's family history includes Alcohol abuse in his father; Asthma in his mother; CAD in his father; COPD in his mother; Cancer in his maternal grandmother; Emphysema in his mother; Heart disease in his father. There is no history of Colon cancer.  ROS:   Please see the history of present illness.     EKGs/Labs/Other Studies Reviewed:    The following studies were reviewed today:  Left Cardiac Catheterization 01/22/2021:   Mid LM to Dist LM lesion is 50% stenosed.   Prox RCA to Mid RCA lesion is 25% stenosed.   The left ventricular systolic function is normal.   LV end diastolic pressure is normal.   The left ventricular ejection fraction is 55-65% by visual estimate.   Nonobstructive CAD. There is a 50% distal left main stenosis but this is not hemodynamically significant by flow wire assessment. Normal LV function Normal LVEDP   Plan: patient is cleared to proceed with evaluation and treatment of his lung mass. Risk factor modification.   Diagnostic Dominance: Right  _______________  Echocardiogram 02/12/2021: Impressions:  1. Left ventricular ejection fraction, by estimation, is 55 to 60%. The  left ventricle has normal function. The left ventricle has no regional  wall motion abnormalities. There is mild concentric left ventricular  hypertrophy. Left ventricular diastolic  function could not be evaluated.   2. Right ventricular systolic function is normal. The right ventricular  size is normal. There is normal pulmonary artery systolic pressure.   3. Left atrial size was mild to moderately dilated.   4. Right atrial size was mild to moderately dilated.   5. A small pericardial effusion is present. There is no evidence of  cardiac tamponade.   6. The mitral valve is normal in structure. No  evidence of mitral valve  regurgitation. No evidence of mitral stenosis.   7. The aortic valve is grossly normal. Aortic valve regurgitation is not  visualized. No aortic stenosis is present.   8. There is mild dilatation of the ascending aorta, measuring 40 mm.   9. The inferior vena cava is dilated in size with >50% respiratory  variability, suggesting right atrial pressure of 8 mmHg.   Comparison(s): Changes from prior study are noted. Now in atrial flutter.   Conclusion(s)/Recommendation(s): Otherwise normal echocardiogram, with  minor abnormalities described in the report. In atrial flutter throughout  the study.    EKG:  EKG ordered today. EKG personally reviewed and demonstrates a flutter, rate 130 bpm, with variable AV block and mild ST depressions in inferior leads and leads V3-V6 (likely rate related).  Recent Labs: 02/16/2021: Magnesium 2.4 02/18/2021: B Natriuretic Peptide 91.6; TSH 1.111 02/26/2021: ALT <5; BUN 16; Creatinine 0.80; Hemoglobin 8.7; Platelet Count 340; Potassium 3.7; Sodium 140  Recent Lipid Panel No results found for: CHOL, TRIG, HDL, CHOLHDL, VLDL, LDLCALC, LDLDIRECT  Physical Exam:    Vital Signs: BP 127/79    Pulse (!) 130    Resp 20    Ht 6\' 1"  (1.854 m)    Wt 199 lb (90.3 kg)    SpO2 93%    BMI 26.25 kg/m     Wt Readings from Last 3 Encounters:  02/27/21 199 lb (90.3 kg)  02/22/21 199 lb (90.3 kg)  02/21/21 199 lb (90.3 kg)     General: 76 y.o. Caucasian male in no acute distress. On home O2 via nasal cannula. HEENT: Normocephalic and atraumatic.  Sclera clear.  Neck: Supple. No carotid bruits. No JVD. Heart: Tachycardic with irregular rhythm.  Distinct S1 and S2. No murmurs, gallops, or rubs. Radial pulses 2+ and equal bilaterally. Lungs: No increased work of breathing. Course breath sounds throughout with diffuse rhonchi but no significant wheezes or crackles appreciated. Abdomen: Soft, non-distended, and non-tender to palpation. Extremities:  No lower extremity edema.    Skin: Warm and dry. Neuro: Alert and oriented x3. No focal deficits. Psych: Normal affect. Responds appropriately.  Assessment:    1. Atrial flutter with rapid ventricular response ([place])   2. Coronary artery disease involving native coronary artery of native heart without angina pectoris   3. Primary hypertension   4. Hyperlipidemia, unspecified hyperlipidemia type   5. Type 2 diabetes mellitus with complication, without long-term current use of insulin (HCC)   6. Pulmonary emphysema, unspecified emphysema type ([place])   7. Malignant neoplasm of lung, unspecified laterality, unspecified part of lung ([place])     Plan:    Atrial Flutter with RVR Patient recently admitted with new onset atrial flutter. Echo showed normal LV function. He also tested positive for COVID that admission so plan was for outpatient cardiac catheterization as outpatient after 3 weeks of uninterrupted anticoagulation. - Still in atrial flutter today with rates in the 130s. Asymptomatic with this.  - Will increase Lopressor to 25mg  twice daily. Patient has some soft BP at times so may be difficult to increase this more. - Continue PO Cardizem 300mg  daily. - Continue Eliquis 5mg  twice daily. - Patient states he missed 1 dose of Eliquis shortly after recent discharge. Patient is currently asymptomatic but I worry that he will develop tachymediated cardiomyopathy if he remains in rapid atrial flutter. Suspect rate control will be difficult especially with soft BP at time. Therefore, recommend TEE/DCCV as soon as possible. Discussed with DOD (Dr. Harl Bowie) who agreed with this plan.   Patient scheduled to TEE/DCCV with Dr. Johney Frame on 03/04/2021. Will place ordered. Emphasized that if patient develops CHF symptoms or lightheadedness/dizziness/near syncope prior to this, he will need to go to the ED.   Shared Decision Making/Informed Consent{ The risks [stroke, cardiac arrhythmias rarely resulting in  the need for a temporary or permanent pacemaker, skin irritation or burns, esophageal damage, perforation (1:10,000 risk), bleeding, pharyngeal hematoma as well as other potential complications associated with conscious sedation including aspiration, arrhythmia, respiratory failure and death], benefits (treatment guidance, restoration of normal sinus rhythm, diagnostic support) and alternatives of a transesophageal echocardiogram guided cardioversion were discussed in detail with Mr. Karel and he is willing to proceed.  CAD Recent LHC in 12/2020 showed non-obstructive CAD. He did have 50% stenosis of mid to distal left main but this was not hemodynamically significant by flow wire assessment. - He has chronic chest pain that is stable and has been felt to be due to his lung cancer. - No Aspirin due to need for Eliquis. - Continue statin.  Hypertension BP well controlled. - Continue Cardizem CD 300mg  daily. - Will increase Lopressor as above for additional rate control.  Hyperlipidemia Lipid panel in 11/2020 (per [facility]): Total Cholesterol 98, Triglycerides 138, HDL 38, LDL 30.  - Continue Lipitor 20mg  daily.   Type 2 Diabetes Mellitus Hemoglobin A1c 5.8 in 01/2021.  - Management per PCP.  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 Currently receiving radiation and chemotherapy for his lung cancer.  - Followed by Pulmonology and HemOnc.  Disposition: He already has a follow-up in the A.Fib Clinic scheduled for 03/14/2021. Recommend keeping this and will arrange follow-up in our office in 3 months.    Medication Adjustments/Labs and Tests Ordered: Current medicines are reviewed at length with the patient today.  Concerns regarding medicines are outlined above.  Orders Placed This Encounter  Procedures   EKG 12-Lead   Meds ordered this encounter  Medications   metoprolol tartrate (LOPRESSOR) 25 MG tablet     Sig: Take 1 tablet (25 mg total) by mouth 2 (two) times daily. Hold if heart rate less than 60 or blood pressure top number is less than 95    Dispense:  180 tablet    Refill:  3    Patient Instructions  Medication Instructions:  INCREASE Metoprolol Tartrate (Lopressor) to 25 mg 2 times a day  *If you need a refill on your cardiac medications before your next appointment, please call your pharmacy*  Lab Work: NONE ordered at this time of appointment   If you have labs (blood work) drawn today and your tests are completely normal, you will receive your results only by: [place] (if you have MyChart) OR A paper copy in the mail If you have any lab test that is abnormal or we need to change your treatment, we will call you to review the results.  Testing/Procedures: Your physician has recommended that you have a Cardioversion (DCCV). Electrical Cardioversion uses a jolt of electricity to your heart either through paddles or wired patches attached to your chest. This is a controlled, usually prescheduled, procedure. Defibrillation is done under light anesthesia in the hospital, and you usually go home the day of the procedure. This is done to get your heart back into a normal rhythm. You are not awake for the procedure. Please see the instruction sheet given to you today.  Scheduled for Monday 03/04/21 with Dr. Johney Frame at 10 AM   Follow-Up: At [facility], you and your health needs are our priority.  As part of our continuing mission to provide you with exceptional heart care, we have created designated Provider Care Teams.  These Care Teams include your primary Cardiologist (physician) and Advanced Practice Providers (APPs -  Physician Assistants and Nurse Practitioners) who all work together to provide you with the care you need, when you need it.  Your next appointment:   3 month(s)  The format for your next appointment:   In Person  Provider:   [name], MD  or   APP         Other Instructions  Dear [name],  You are scheduled for a TEE/Cardioversion on Monday 03/04/21.  with Dr. Johney Frame.  Please arrive at the [facility] (Main Entrance A) at [facility]: [address] [place], [place] [address] at 9:00 AM.)  DIET: Nothing to eat or drink after midnight except a sip of water with medications (see medication instructions below)  FYI: For your safety, and to allow [place] to monitor your vital signs accurately during the surgery/procedure we request that   if you have artificial nails, gel coating, SNS etc. Please have those removed prior to your surgery/procedure. Not having the nail coverings /polish removed may result in cancellation or delay of your surgery/procedure.  Medication Instructions: Continue your anticoagulant: Eliquis (Apixaban) You will need to continue your anticoagulant after your procedure until you  are told by your  Provider that it is safe to stop   You must have a  responsible person to drive you home and stay in the waiting area during your procedure. Failure to do so could result in cancellation.  Bring your insurance cards.  *Special Note: Every effort is made to have your procedure done on time. Occasionally there are emergencies that occur at the hospital that may cause delays. Please be patient if a delay does occur.      Signed, Darreld Mclean, PA-C  02/27/2021 2:37 PM    [facility]

## 2021-02-28 ENCOUNTER — Other Ambulatory Visit: Payer: Self-pay

## 2021-02-28 ENCOUNTER — Ambulatory Visit
Admission: RE | Admit: 2021-02-28 | Discharge: 2021-02-28 | Disposition: A | Discharge status: Home / Self Care | Payer: Medicare Other | Source: Ambulatory Visit | Attending: Radiation Oncology | Admitting: Radiation Oncology

## 2021-02-28 ENCOUNTER — Ambulatory Visit: Payer: Medicare Other | Admitting: Cardiology

## 2021-02-28 DIAGNOSIS — I4892 Unspecified atrial flutter: Secondary | ICD-10-CM | POA: Diagnosis not present

## 2021-02-28 DIAGNOSIS — G20 Parkinson's disease: Secondary | ICD-10-CM | POA: Diagnosis not present

## 2021-02-28 DIAGNOSIS — Z87891 Personal history of nicotine dependence: Secondary | ICD-10-CM | POA: Diagnosis not present

## 2021-02-28 DIAGNOSIS — J9621 Acute and chronic respiratory failure with hypoxia: Secondary | ICD-10-CM | POA: Diagnosis not present

## 2021-02-28 DIAGNOSIS — C3432 Malignant neoplasm of lower lobe, left bronchus or lung: Secondary | ICD-10-CM | POA: Diagnosis not present

## 2021-02-28 DIAGNOSIS — Z510 Encounter for antineoplastic radiation therapy: Secondary | ICD-10-CM | POA: Diagnosis not present

## 2021-03-01 ENCOUNTER — Ambulatory Visit
Admission: RE | Admit: 2021-03-01 | Discharge: 2021-03-01 | Disposition: A | Discharge status: Home / Self Care | Payer: Medicare Other | Source: Ambulatory Visit | Attending: Radiation Oncology | Admitting: Radiation Oncology

## 2021-03-01 ENCOUNTER — Telehealth: Payer: Self-pay | Admitting: Student

## 2021-03-01 DIAGNOSIS — Z87891 Personal history of nicotine dependence: Secondary | ICD-10-CM | POA: Diagnosis not present

## 2021-03-01 DIAGNOSIS — I4892 Unspecified atrial flutter: Secondary | ICD-10-CM | POA: Diagnosis not present

## 2021-03-01 DIAGNOSIS — J9621 Acute and chronic respiratory failure with hypoxia: Secondary | ICD-10-CM | POA: Diagnosis not present

## 2021-03-01 DIAGNOSIS — C3432 Malignant neoplasm of lower lobe, left bronchus or lung: Secondary | ICD-10-CM | POA: Diagnosis not present

## 2021-03-01 DIAGNOSIS — G20 Parkinson's disease: Secondary | ICD-10-CM | POA: Diagnosis not present

## 2021-03-01 DIAGNOSIS — U071 COVID-19: Secondary | ICD-10-CM | POA: Diagnosis not present

## 2021-03-01 DIAGNOSIS — E119 Type 2 diabetes mellitus without complications: Secondary | ICD-10-CM | POA: Diagnosis not present

## 2021-03-01 DIAGNOSIS — Z510 Encounter for antineoplastic radiation therapy: Secondary | ICD-10-CM | POA: Diagnosis not present

## 2021-03-01 NOTE — Telephone Encounter (Signed)
Pts wife states that he missed his dose of Eliquis last Friday at night... just wanted to notify you.. please advise.

## 2021-03-04 ENCOUNTER — Encounter (HOSPITAL_COMMUNITY)
Admission: RE | Disposition: A | Discharge status: Home / Self Care | Payer: Self-pay | Source: Home / Self Care | Attending: Cardiology

## 2021-03-04 ENCOUNTER — Ambulatory Visit: Payer: Medicare Other

## 2021-03-04 ENCOUNTER — Ambulatory Visit (HOSPITAL_COMMUNITY): Payer: Medicare Other | Admitting: General Practice

## 2021-03-04 ENCOUNTER — Ambulatory Visit (HOSPITAL_BASED_OUTPATIENT_CLINIC_OR_DEPARTMENT_OTHER): Payer: Medicare Other

## 2021-03-04 ENCOUNTER — Ambulatory Visit (HOSPITAL_COMMUNITY)
Admission: RE | Admit: 2021-03-04 | Discharge: 2021-03-04 | Disposition: A | Discharge status: Home / Self Care | Payer: Medicare Other | Attending: Cardiology | Admitting: Cardiology

## 2021-03-04 ENCOUNTER — Encounter (HOSPITAL_COMMUNITY): Payer: Self-pay | Admitting: Cardiology

## 2021-03-04 ENCOUNTER — Other Ambulatory Visit: Payer: Self-pay

## 2021-03-04 DIAGNOSIS — I351 Nonrheumatic aortic (valve) insufficiency: Secondary | ICD-10-CM | POA: Insufficient documentation

## 2021-03-04 DIAGNOSIS — Z9981 Dependence on supplemental oxygen: Secondary | ICD-10-CM | POA: Insufficient documentation

## 2021-03-04 DIAGNOSIS — Z79899 Other long term (current) drug therapy: Secondary | ICD-10-CM | POA: Diagnosis not present

## 2021-03-04 DIAGNOSIS — Z8616 Personal history of COVID-19: Secondary | ICD-10-CM | POA: Insufficient documentation

## 2021-03-04 DIAGNOSIS — E785 Hyperlipidemia, unspecified: Secondary | ICD-10-CM | POA: Insufficient documentation

## 2021-03-04 DIAGNOSIS — Z7901 Long term (current) use of anticoagulants: Secondary | ICD-10-CM | POA: Diagnosis not present

## 2021-03-04 DIAGNOSIS — I10 Essential (primary) hypertension: Secondary | ICD-10-CM | POA: Diagnosis not present

## 2021-03-04 DIAGNOSIS — E119 Type 2 diabetes mellitus without complications: Secondary | ICD-10-CM | POA: Insufficient documentation

## 2021-03-04 DIAGNOSIS — I4892 Unspecified atrial flutter: Secondary | ICD-10-CM | POA: Insufficient documentation

## 2021-03-04 DIAGNOSIS — C3490 Malignant neoplasm of unspecified part of unspecified bronchus or lung: Secondary | ICD-10-CM | POA: Insufficient documentation

## 2021-03-04 DIAGNOSIS — J449 Chronic obstructive pulmonary disease, unspecified: Secondary | ICD-10-CM | POA: Insufficient documentation

## 2021-03-04 DIAGNOSIS — I2510 Atherosclerotic heart disease of native coronary artery without angina pectoris: Secondary | ICD-10-CM | POA: Insufficient documentation

## 2021-03-04 DIAGNOSIS — I340 Nonrheumatic mitral (valve) insufficiency: Secondary | ICD-10-CM

## 2021-03-04 DIAGNOSIS — I4891 Unspecified atrial fibrillation: Secondary | ICD-10-CM | POA: Diagnosis not present

## 2021-03-04 HISTORY — PX: CARDIOVERSION: SHX1299

## 2021-03-04 HISTORY — PX: TEE WITHOUT CARDIOVERSION: SHX5443

## 2021-03-04 LAB — GLUCOSE, CAPILLARY: Glucose-Capillary: 125 mg/dL — ABNORMAL HIGH (ref 70–99)

## 2021-03-04 SURGERY — ECHOCARDIOGRAM, TRANSESOPHAGEAL
Anesthesia: General

## 2021-03-04 MED ORDER — LIDOCAINE 2% (20 MG/ML) 5 ML SYRINGE
INTRAMUSCULAR | Status: DC | PRN
  Administered 2021-03-04: 100 mg via INTRAVENOUS

## 2021-03-04 MED ORDER — PROPOFOL 10 MG/ML IV BOLUS
INTRAVENOUS | Status: DC | PRN
Start: 2021-03-04 — End: 2021-03-04
  Administered 2021-03-04 (×2): 20 mg via INTRAVENOUS

## 2021-03-04 MED ORDER — PROPOFOL 500 MG/50ML IV EMUL
INTRAVENOUS | Status: DC | PRN
  Administered 2021-03-04: 100 ug/kg/min via INTRAVENOUS

## 2021-03-04 MED ORDER — SODIUM CHLORIDE 0.9 % IV SOLN: INTRAVENOUS | Status: DC

## 2021-03-04 MED ORDER — EPHEDRINE SULFATE-NACL 50-0.9 MG/10ML-% IV SOSY
PREFILLED_SYRINGE | INTRAVENOUS | Status: DC | PRN
  Administered 2021-03-04 (×2): 5 mg via INTRAVENOUS

## 2021-03-04 MED FILL — Dexamethasone Sodium Phosphate Inj 100 MG/10ML: INTRAMUSCULAR | Qty: 1 | Status: AC

## 2021-03-04 NOTE — Transfer of Care (Signed)
Immediate Anesthesia Transfer of Care Note  Patient: James [name]  Procedure(s) Performed: TRANSESOPHAGEAL ECHOCARDIOGRAM (TEE) CARDIOVERSION  Patient Location: Endoscopy Unit  Anesthesia Type:MAC  Level of Consciousness: awake and drowsy  Airway & Oxygen Therapy: Patient Spontanous Breathing and Patient connected to nasal cannula oxygen  Post-op Assessment: Report given to RN and Post -op Vital signs reviewed and stable  Post vital signs: Reviewed and stable  Last Vitals:  Vitals Value Taken Time  BP 99/52 03/04/21 1034  Temp    Pulse 58 03/04/21 1034  Resp 19 03/04/21 1034  SpO2 100 % 03/04/21 1034  Vitals shown include unvalidated device data.  Last Pain:  Vitals:   03/04/21 0923  TempSrc: Oral  PainSc: 0-No pain         Complications: No notable events documented.

## 2021-03-04 NOTE — CV Procedure (Signed)
° ° ° °  Transesophageal Echocardiogram Note  James [name] [id number] [birth year]  Procedure: Transesophageal Echocardiogram Indications: Atrial flutter  Procedure Details Consent: Obtained Time Out: Verified patient identification, verified procedure, site/side was marked, verified correct patient position, special equipment/implants available, Radiology Safety Procedures followed,  medications/allergies/relevent history reviewed, required imaging and test results available.  Performed  Medications: Propofol: 186mg  Lidocaine: 100mg   Left Ventrical:  LVEF 55%  Mitral Valve: Mild MR  Aortic Valve: Mild AI  Tricuspid Valve: Trivial TR  Pulmonic Valve: Trivial PI  Left Atrium/ Left atrial appendage: No evidence of LAA thrombus  Atrial septum: Grossly normal  Aorta: Mild plaquing  Following TEE, patient was successfully cardioverted with 150J. Please see separate note for details.   Complications: No apparent complications Patient did tolerate procedure well.  James Bergeron, MD 03/04/2021, 10:31 AM

## 2021-03-04 NOTE — Interval H&P Note (Signed)
History and Physical Interval Note:  03/04/2021 9:59 AM  James [name]  has presented today for surgery, with the diagnosis of afib.  The various methods of treatment have been discussed with the patient and family. After consideration of risks, benefits and other options for treatment, the patient has consented to  Procedure(s): TRANSESOPHAGEAL ECHOCARDIOGRAM (TEE) (N/A) CARDIOVERSION (N/A) as a surgical intervention.  The patient's history has been reviewed, patient examined, no change in status, stable for surgery.  I have reviewed the patient's chart and labs.  Questions were answered to the patient's satisfaction.     [name]

## 2021-03-04 NOTE — Anesthesia Procedure Notes (Addendum)
Procedure Name: MAC Date/Time: 03/04/2021 10:11 AM Performed by: Dorann Lodge, CRNA Pre-anesthesia Checklist: Patient identified, Emergency Drugs available, Suction available, Patient being monitored and Timeout performed Patient Re-evaluated:Patient Re-evaluated prior to induction Oxygen Delivery Method: Nasal cannula Preoxygenation: Pre-oxygenation with 100% oxygen Airway Equipment and Method: Bite block Dental Injury: Teeth and Oropharynx as per pre-operative assessment

## 2021-03-04 NOTE — Discharge Instructions (Signed)

## 2021-03-04 NOTE — Progress Notes (Signed)
°  Echocardiogram Echocardiogram Transesophageal has been performed.  James [name] 03/04/2021, 10:52 AM

## 2021-03-04 NOTE — Anesthesia Preprocedure Evaluation (Signed)
Anesthesia Evaluation  Patient identified by MRN, date of birth, ID band Patient awake    Reviewed: Allergy & Precautions, NPO status , Patient's Chart, lab work & pertinent test results  Airway Mallampati: II  TM Distance: >3 FB Neck ROM: Full    Dental  (+) Dental Advisory Given, Missing, Chipped   Pulmonary shortness of breath and Long-Term Oxygen Therapy, COPD,  oxygen dependent, former smoker,    Pulmonary exam normal breath sounds clear to auscultation       Cardiovascular hypertension, + dysrhythmias Atrial Fibrillation  Rhythm:Irregular Rate:Abnormal     Neuro/Psych PSYCHIATRIC DISORDERS Depression PD    GI/Hepatic negative GI ROS, Neg liver ROS,   Endo/Other  diabetes, Type 2  Renal/GU negative Renal ROS     Musculoskeletal  (+) Arthritis ,   Abdominal   Peds  Hematology  (+) Blood dyscrasia, anemia ,   Anesthesia Other Findings Day of surgery medications reviewed with the patient.  Reproductive/Obstetrics                             Anesthesia Physical Anesthesia Plan  ASA: 3  Anesthesia Plan: General   Post-op Pain Management:    Induction: Intravenous  PONV Risk Score and Plan: 2 and TIVA and Treatment may vary due to age or medical condition  Airway Management Planned: Natural Airway and Nasal Cannula  Additional Equipment:   Intra-op Plan:   Post-operative Plan:   Informed Consent: I have reviewed the patients History and Physical, chart, labs and discussed the procedure including the risks, benefits and alternatives for the proposed anesthesia with the patient or authorized representative who has indicated his/her understanding and acceptance.     Dental advisory given  Plan Discussed with: CRNA  Anesthesia Plan Comments:         Anesthesia Quick Evaluation

## 2021-03-04 NOTE — Anesthesia Postprocedure Evaluation (Signed)
Anesthesia Post Note  Patient: James [name]  Procedure(s) Performed: TRANSESOPHAGEAL ECHOCARDIOGRAM (TEE) CARDIOVERSION     Patient location during evaluation: Endoscopy Anesthesia Type: General Level of consciousness: awake and alert Pain management: pain level controlled Vital Signs Assessment: post-procedure vital signs reviewed and stable Respiratory status: spontaneous breathing, nonlabored ventilation, respiratory function stable and patient connected to nasal cannula oxygen Cardiovascular status: blood pressure returned to baseline and stable Postop Assessment: no apparent nausea or vomiting Anesthetic complications: no   No notable events documented.  Last Vitals:  Vitals:   03/04/21 1045 03/04/21 1054  BP: (!) 95/57 (!) 106/56  Pulse: 61   Resp: (!) 25   Temp:    SpO2: 100%     Last Pain:  Vitals:   03/04/21 1054  TempSrc:   PainSc: 0-No pain                 Catalina Gravel

## 2021-03-04 NOTE — CV Procedure (Signed)
Procedure: Electrical Cardioversion Indications:  Atrial Flutter  Procedure Details:  Consent: Risks of procedure as well as the alternatives and risks of each were explained to the (patient/caregiver).  Consent for procedure obtained.  Time Out: Verified patient identification, verified procedure, site/side was marked, verified correct patient position, special equipment/implants available, medications/allergies/relevent history reviewed, required imaging and test results available. PERFORMED.  Patient placed on cardiac monitor, pulse oximetry, supplemental oxygen as necessary.  Sedation given:  Propofol 186mg ; lidocaine 100mg  Pacer pads placed anterior and posterior chest.  Cardioverted 1 time(s).  Cardioversion with synchronized biphasic 150J shock.  Evaluation: Findings: Post procedure EKG shows: NSR Complications: None Patient did tolerate procedure well.  Time Spent Directly with the Patient:  53minutes   [name] 03/04/2021, 10:33 AM

## 2021-03-05 ENCOUNTER — Ambulatory Visit: Payer: Medicare Other

## 2021-03-05 ENCOUNTER — Encounter (HOSPITAL_COMMUNITY): Payer: Self-pay | Admitting: Cardiology

## 2021-03-05 ENCOUNTER — Ambulatory Visit: Payer: Medicare Other | Admitting: Internal Medicine

## 2021-03-05 ENCOUNTER — Other Ambulatory Visit: Payer: Medicare Other

## 2021-03-05 ENCOUNTER — Inpatient Hospital Stay: Payer: Medicare Other

## 2021-03-05 ENCOUNTER — Ambulatory Visit
Admission: RE | Admit: 2021-03-05 | Discharge: 2021-03-05 | Disposition: A | Discharge status: Home / Self Care | Payer: Medicare Other | Source: Ambulatory Visit | Attending: Radiation Oncology | Admitting: Radiation Oncology

## 2021-03-05 VITALS — BP 117/59 | HR 67 | Temp 97.8°F | Resp 18 | Wt 199.5 lb

## 2021-03-05 DIAGNOSIS — C3432 Malignant neoplasm of lower lobe, left bronchus or lung: Secondary | ICD-10-CM | POA: Diagnosis not present

## 2021-03-05 DIAGNOSIS — D509 Iron deficiency anemia, unspecified: Secondary | ICD-10-CM

## 2021-03-05 DIAGNOSIS — Z87891 Personal history of nicotine dependence: Secondary | ICD-10-CM | POA: Diagnosis not present

## 2021-03-05 DIAGNOSIS — Z510 Encounter for antineoplastic radiation therapy: Secondary | ICD-10-CM | POA: Diagnosis not present

## 2021-03-05 LAB — CBC WITH DIFFERENTIAL (CANCER CENTER ONLY)
Abs Immature Granulocytes: 0.03 10*3/uL (ref 0.00–0.07)
Basophils Absolute: 0 10*3/uL (ref 0.0–0.1)
Basophils Relative: 1 %
Eosinophils Absolute: 0.1 10*3/uL (ref 0.0–0.5)
Eosinophils Relative: 1 %
HCT: 27.8 % — ABNORMAL LOW (ref 39.0–52.0)
Hemoglobin: 7.7 g/dL — ABNORMAL LOW (ref 13.0–17.0)
Immature Granulocytes: 1 %
Lymphocytes Relative: 9 %
Lymphs Abs: 0.5 10*3/uL — ABNORMAL LOW (ref 0.7–4.0)
MCH: 20 pg — ABNORMAL LOW (ref 26.0–34.0)
MCHC: 27.7 g/dL — ABNORMAL LOW (ref 30.0–36.0)
MCV: 72.2 fL — ABNORMAL LOW (ref 80.0–100.0)
Monocytes Absolute: 0.3 10*3/uL (ref 0.1–1.0)
Monocytes Relative: 6 %
Neutro Abs: 4.2 10*3/uL (ref 1.7–7.7)
Neutrophils Relative %: 82 %
Platelet Count: 228 10*3/uL (ref 150–400)
RBC: 3.85 MIL/uL — ABNORMAL LOW (ref 4.22–5.81)
RDW: 19.9 % — ABNORMAL HIGH (ref 11.5–15.5)
WBC Count: 5.1 10*3/uL (ref 4.0–10.5)
nRBC: 0 % (ref 0.0–0.2)

## 2021-03-05 LAB — CMP (CANCER CENTER ONLY)
ALT: <5 U/L (ref 0–44)
AST: 8 U/L — ABNORMAL LOW (ref 15–41)
Albumin: 3.4 g/dL — ABNORMAL LOW (ref 3.5–5.0)
Alkaline Phosphatase: 72 U/L (ref 38–126)
Anion gap: 6 (ref 5–15)
BUN: 16 mg/dL (ref 8–23)
CO2: 33 mmol/L — ABNORMAL HIGH (ref 22–32)
Calcium: 9.5 mg/dL (ref 8.9–10.3)
Chloride: 103 mmol/L (ref 98–111)
Creatinine: 0.86 mg/dL (ref 0.61–1.24)
GFR, Estimated: >60 mL/min (ref >60)
Glucose, Bld: 113 mg/dL — ABNORMAL HIGH (ref 70–99)
Potassium: 3.9 mmol/L (ref 3.5–5.1)
Sodium: 142 mmol/L (ref 135–145)
Total Bilirubin: 0.3 mg/dL (ref 0.3–1.2)
Total Protein: 6.4 g/dL — ABNORMAL LOW (ref 6.5–8.1)

## 2021-03-05 LAB — SAMPLE TO BLOOD BANK

## 2021-03-05 MED ORDER — FAMOTIDINE 20 MG IN NS 100 ML IVPB
20.0000 mg | Freq: Once | INTRAVENOUS | Status: AC
  Administered 2021-03-05: 20 mg via INTRAVENOUS
  Filled 2021-03-05: qty 100

## 2021-03-05 MED ORDER — SODIUM CHLORIDE 0.9 % IV SOLN
300.0000 mg | Freq: Once | INTRAVENOUS | Status: AC
  Administered 2021-03-05: 300 mg via INTRAVENOUS
  Filled 2021-03-05: qty 300

## 2021-03-05 MED ORDER — SODIUM CHLORIDE 0.9 % IV SOLN: Freq: Once | INTRAVENOUS | Status: AC

## 2021-03-05 MED ORDER — SODIUM CHLORIDE 0.9 % IV SOLN
10.0000 mg | Freq: Once | INTRAVENOUS | Status: AC
  Administered 2021-03-05: 10 mg via INTRAVENOUS
  Filled 2021-03-05: qty 10
  Filled 2021-03-05: qty 1

## 2021-03-05 MED ORDER — ACETAMINOPHEN 325 MG PO TABS
650.0000 mg | ORAL_TABLET | Freq: Once | ORAL | Status: AC
  Administered 2021-03-05: 650 mg via ORAL
  Filled 2021-03-05: qty 2

## 2021-03-05 MED ORDER — PALONOSETRON HCL INJECTION 0.25 MG/5ML
0.2500 mg | Freq: Once | INTRAVENOUS | Status: AC
  Administered 2021-03-05: 0.25 mg via INTRAVENOUS
  Filled 2021-03-05: qty 5

## 2021-03-05 MED ORDER — SODIUM CHLORIDE 0.9 % IV SOLN
223.6000 mg | Freq: Once | INTRAVENOUS | Status: AC
  Administered 2021-03-05: 220 mg via INTRAVENOUS
  Filled 2021-03-05: qty 22

## 2021-03-05 MED ORDER — SODIUM CHLORIDE 0.9 % IV SOLN
45.0000 mg/m2 | Freq: Once | INTRAVENOUS | Status: AC
  Administered 2021-03-05: 102 mg via INTRAVENOUS
  Filled 2021-03-05: qty 17

## 2021-03-05 MED ORDER — DIPHENHYDRAMINE HCL 50 MG/ML IJ SOLN
50.0000 mg | Freq: Once | INTRAMUSCULAR | Status: AC
  Administered 2021-03-05: 50 mg via INTRAVENOUS
  Filled 2021-03-05: qty 1

## 2021-03-05 NOTE — Patient Instructions (Addendum)
PORT PLACEMENT APPT 03/18/21 @ 12:30 [facility]  Discharge Instructions: Thank you for choosing [facility] to provide your oncology and hematology care.   If you have a lab appointment with the [facility], please go directly to the [facility] and check in at the registration area.   Wear comfortable clothing and clothing appropriate for easy access to any Portacath or PICC line.   We strive to give you quality time with your provider. You may need to reschedule your appointment if you arrive late (15 or more minutes).  Arriving late affects you and other patients whose appointments are after yours.  Also, if you miss three or more appointments without notifying the office, you may be dismissed from the clinic at the providers discretion.      For prescription refill requests, have your pharmacy contact our office and allow 72 hours for refills to be completed.    Today you received the following chemotherapy and/or immunotherapy agents: Taxol & Carboplatin   To help prevent nausea and vomiting after your treatment, we encourage you to take your nausea medication as directed.  BELOW ARE SYMPTOMS THAT SHOULD BE REPORTED IMMEDIATELY: *FEVER GREATER THAN 100.4 F (38 C) OR HIGHER *CHILLS OR SWEATING *NAUSEA AND VOMITING THAT IS NOT CONTROLLED WITH YOUR NAUSEA MEDICATION *UNUSUAL SHORTNESS OF BREATH *UNUSUAL BRUISING OR BLEEDING *URINARY PROBLEMS (pain or burning when urinating, or frequent urination) *BOWEL PROBLEMS (unusual diarrhea, constipation, pain near the anus) TENDERNESS IN MOUTH AND THROAT WITH OR WITHOUT PRESENCE OF ULCERS (sore throat, sores in mouth, or a toothache) UNUSUAL RASH, SWELLING OR PAIN  UNUSUAL VAGINAL DISCHARGE OR ITCHING   Items with * indicate a potential emergency and should be followed up as soon as possible or go to the Emergency Department if any problems should occur.  Please show the CHEMOTHERAPY ALERT CARD  or IMMUNOTHERAPY ALERT CARD at check-in to the Emergency Department and triage nurse.  Should you have questions after your visit or need to cancel or reschedule your appointment, please contact [facility]  Dept: [phone]  and follow the prompts.  Office hours are 8:00 a.m. to 4:30 p.m. Monday - Friday. Please note that voicemails left after 4:00 p.m. may not be returned until the following business day.  We are closed weekends and major holidays. You have access to a nurse at all times for urgent questions. Please call the main number to the clinic Dept: [phone] and follow the prompts.   For any non-urgent questions, you may also contact your provider using MyChart. We now offer e-Visits for anyone 67 and older to request care online for non-urgent symptoms. For details visit [email or web address].   Also download the MyChart app! Go to the app store, search "MyChart", open the app, select [facility], and log in with your MyChart username and password.  Due to Covid, a mask is required upon entering the hospital/clinic. If you do not have a mask, one will be given to you upon arrival. For doctor visits, patients may have 1 support person aged 16 or older with them. For treatment visits, patients cannot have anyone with them due to current Covid guidelines and our immunocompromised population.   Iron Sucrose Injection What is this medication? IRON SUCROSE (EYE ern SOO krose) treats low levels of iron (iron deficiency anemia) in people with kidney disease. Iron is a mineral that plays an important role in making red blood cells, which carry oxygen from  your lungs to the rest of your body. This medicine may be used for other purposes; ask your health care provider or pharmacist if you have questions. COMMON BRAND NAME(S): Venofer What should I tell my care team before I take this medication? They need to know if you have any of these conditions: Anemia not  caused by low iron levels Heart disease High levels of iron in the blood Kidney disease Liver disease An unusual or allergic reaction to iron, other medications, foods, dyes, or preservatives Pregnant or trying to get pregnant Breast-feeding How should I use this medication? This medication is for infusion into a vein. It is given in a hospital or clinic setting. Talk to your care team about the use of this medication in children. While this medication may be prescribed for children as young as 2 years for selected conditions, precautions do apply. Overdosage: If you think you have taken too much of this medicine contact a poison control center or emergency room at once. NOTE: This medicine is only for you. Do not share this medicine with others. What if I miss a dose? It is important not to miss your dose. Call your care team if you are unable to keep an appointment. What may interact with this medication? Do not take this medication with any of the following: Deferoxamine Dimercaprol Other iron products This medication may also interact with the following: Chloramphenicol Deferasirox This list may not describe all possible interactions. Give your health care provider a list of all the medicines, herbs, non-prescription drugs, or dietary supplements you use. Also tell them if you smoke, drink alcohol, or use illegal drugs. Some items may interact with your medicine. What should I watch for while using this medication? Visit your care team regularly. Tell your care team if your symptoms do not start to get better or if they get worse. You may need blood work done while you are taking this medication. You may need to follow a special diet. Talk to your care team. Foods that contain iron include: whole grains/cereals, dried fruits, beans, or peas, leafy green vegetables, and organ meats (liver, kidney). What side effects may I notice from receiving this medication? Side effects that you  should report to your care team as soon as possible: Allergic reactions--skin rash, itching, hives, swelling of the face, lips, tongue, or throat Low blood pressure--dizziness, feeling faint or lightheaded, blurry vision Shortness of breath Side effects that usually do not require medical attention (report to your care team if they continue or are bothersome): Flushing Headache Joint pain Muscle pain Nausea Pain, redness, or irritation at injection site This list may not describe all possible side effects. Call your doctor for medical advice about side effects. You may report side effects to FDA at 1-800-FDA-1088. Where should I keep my medication? This medication is given in a hospital or clinic and will not be stored at home. NOTE: This sheet is a summary. It may not cover all possible information. If you have questions about this medicine, talk to your doctor, pharmacist, or health care provider.  2022 Elsevier/Gold Standard (2020-07-06 00:00:00)

## 2021-03-05 NOTE — Progress Notes (Signed)
Per Dr. Julien Nordmann OK to trt w/ Hgb 7.7 today

## 2021-03-06 ENCOUNTER — Ambulatory Visit
Admission: RE | Admit: 2021-03-06 | Discharge: 2021-03-06 | Disposition: A | Discharge status: Home / Self Care | Payer: Medicare Other | Source: Ambulatory Visit | Attending: Radiation Oncology | Admitting: Radiation Oncology

## 2021-03-06 ENCOUNTER — Other Ambulatory Visit: Payer: Self-pay

## 2021-03-06 DIAGNOSIS — Z87891 Personal history of nicotine dependence: Secondary | ICD-10-CM | POA: Diagnosis not present

## 2021-03-06 DIAGNOSIS — C3432 Malignant neoplasm of lower lobe, left bronchus or lung: Secondary | ICD-10-CM | POA: Diagnosis not present

## 2021-03-06 DIAGNOSIS — Z510 Encounter for antineoplastic radiation therapy: Secondary | ICD-10-CM | POA: Diagnosis not present

## 2021-03-06 NOTE — Telephone Encounter (Signed)
Sorry, I have been out of the office so I am just now seeing this. Patient notified me that he had missed a dose of Eliquis at our visit last week which is why we planned for the TEE prior to his cardioversion on Monday. It looks like his cardioversion was successful which is great. It is very important that following his cardioversion, he does not miss any doses of his Eliquis. I would recommend he keep his appointment [facility] on 03[facility]14/2021 to make sure he is not back in atrial flutter.  Thank you so much! [name]

## 2021-03-07 ENCOUNTER — Ambulatory Visit
Admission: RE | Admit: 2021-03-07 | Discharge: 2021-03-07 | Disposition: A | Discharge status: Home / Self Care | Payer: Medicare Other | Source: Ambulatory Visit | Attending: Radiation Oncology | Admitting: Radiation Oncology

## 2021-03-07 DIAGNOSIS — E119 Type 2 diabetes mellitus without complications: Secondary | ICD-10-CM | POA: Diagnosis not present

## 2021-03-07 DIAGNOSIS — U071 COVID-19: Secondary | ICD-10-CM | POA: Diagnosis not present

## 2021-03-07 DIAGNOSIS — Z87891 Personal history of nicotine dependence: Secondary | ICD-10-CM | POA: Diagnosis not present

## 2021-03-07 DIAGNOSIS — J9621 Acute and chronic respiratory failure with hypoxia: Secondary | ICD-10-CM | POA: Diagnosis not present

## 2021-03-07 DIAGNOSIS — Z510 Encounter for antineoplastic radiation therapy: Secondary | ICD-10-CM | POA: Diagnosis not present

## 2021-03-07 DIAGNOSIS — I4892 Unspecified atrial flutter: Secondary | ICD-10-CM | POA: Diagnosis not present

## 2021-03-07 DIAGNOSIS — G20 Parkinson's disease: Secondary | ICD-10-CM | POA: Diagnosis not present

## 2021-03-07 DIAGNOSIS — C3432 Malignant neoplasm of lower lobe, left bronchus or lung: Secondary | ICD-10-CM | POA: Diagnosis not present

## 2021-03-07 NOTE — Telephone Encounter (Signed)
Pt Wife informed of providers result & recommendations. Pt verbalized understanding. No further questions . She will make sure pt goes to 1-19 appt

## 2021-03-08 ENCOUNTER — Ambulatory Visit: Payer: Medicare Other | Admitting: Physician Assistant

## 2021-03-08 ENCOUNTER — Other Ambulatory Visit: Payer: Self-pay

## 2021-03-08 ENCOUNTER — Ambulatory Visit
Admission: RE | Admit: 2021-03-08 | Discharge: 2021-03-08 | Disposition: A | Discharge status: Home / Self Care | Payer: Medicare Other | Source: Ambulatory Visit | Attending: Radiation Oncology | Admitting: Radiation Oncology

## 2021-03-08 DIAGNOSIS — C3432 Malignant neoplasm of lower lobe, left bronchus or lung: Secondary | ICD-10-CM | POA: Diagnosis not present

## 2021-03-08 DIAGNOSIS — Z87891 Personal history of nicotine dependence: Secondary | ICD-10-CM | POA: Diagnosis not present

## 2021-03-08 DIAGNOSIS — Z510 Encounter for antineoplastic radiation therapy: Secondary | ICD-10-CM | POA: Diagnosis not present

## 2021-03-11 ENCOUNTER — Other Ambulatory Visit: Payer: Self-pay

## 2021-03-11 ENCOUNTER — Ambulatory Visit
Admission: RE | Admit: 2021-03-11 | Discharge: 2021-03-11 | Disposition: A | Discharge status: Home / Self Care | Payer: Medicare Other | Source: Ambulatory Visit | Attending: Radiation Oncology | Admitting: Radiation Oncology

## 2021-03-11 ENCOUNTER — Encounter (HOSPITAL_COMMUNITY): Payer: Self-pay

## 2021-03-11 ENCOUNTER — Ambulatory Visit: Payer: Medicare Other | Admitting: Pulmonary Disease

## 2021-03-11 DIAGNOSIS — I4892 Unspecified atrial flutter: Secondary | ICD-10-CM | POA: Diagnosis not present

## 2021-03-11 DIAGNOSIS — G20 Parkinson's disease: Secondary | ICD-10-CM | POA: Diagnosis not present

## 2021-03-11 DIAGNOSIS — U071 COVID-19: Secondary | ICD-10-CM | POA: Diagnosis not present

## 2021-03-11 DIAGNOSIS — E119 Type 2 diabetes mellitus without complications: Secondary | ICD-10-CM | POA: Diagnosis not present

## 2021-03-11 DIAGNOSIS — Z510 Encounter for antineoplastic radiation therapy: Secondary | ICD-10-CM | POA: Diagnosis not present

## 2021-03-11 DIAGNOSIS — Z87891 Personal history of nicotine dependence: Secondary | ICD-10-CM | POA: Diagnosis not present

## 2021-03-11 DIAGNOSIS — J9621 Acute and chronic respiratory failure with hypoxia: Secondary | ICD-10-CM | POA: Diagnosis not present

## 2021-03-11 DIAGNOSIS — C3432 Malignant neoplasm of lower lobe, left bronchus or lung: Secondary | ICD-10-CM | POA: Diagnosis not present

## 2021-03-12 ENCOUNTER — Other Ambulatory Visit: Payer: Self-pay

## 2021-03-12 ENCOUNTER — Inpatient Hospital Stay: Payer: Medicare Other

## 2021-03-12 ENCOUNTER — Ambulatory Visit
Admission: RE | Admit: 2021-03-12 | Discharge: 2021-03-12 | Disposition: A | Discharge status: Home / Self Care | Payer: Medicare Other | Source: Ambulatory Visit | Attending: Radiation Oncology | Admitting: Radiation Oncology

## 2021-03-12 ENCOUNTER — Other Ambulatory Visit: Payer: Self-pay | Admitting: Physician Assistant

## 2021-03-12 ENCOUNTER — Inpatient Hospital Stay (HOSPITAL_BASED_OUTPATIENT_CLINIC_OR_DEPARTMENT_OTHER): Payer: Medicare Other | Admitting: Internal Medicine

## 2021-03-12 ENCOUNTER — Ambulatory Visit: Payer: Medicare Other

## 2021-03-12 ENCOUNTER — Encounter: Payer: Self-pay | Admitting: Internal Medicine

## 2021-03-12 ENCOUNTER — Other Ambulatory Visit: Payer: Medicare Other

## 2021-03-12 VITALS — BP 129/56 | HR 57 | Temp 98.1°F | Resp 18

## 2021-03-12 VITALS — BP 106/45 | HR 64 | Temp 97.2°F | Resp 17 | Ht 73.0 in

## 2021-03-12 DIAGNOSIS — D500 Iron deficiency anemia secondary to blood loss (chronic): Secondary | ICD-10-CM | POA: Diagnosis not present

## 2021-03-12 DIAGNOSIS — R0602 Shortness of breath: Secondary | ICD-10-CM | POA: Diagnosis not present

## 2021-03-12 DIAGNOSIS — Z9981 Dependence on supplemental oxygen: Secondary | ICD-10-CM

## 2021-03-12 DIAGNOSIS — C3432 Malignant neoplasm of lower lobe, left bronchus or lung: Secondary | ICD-10-CM

## 2021-03-12 DIAGNOSIS — Z5111 Encounter for antineoplastic chemotherapy: Secondary | ICD-10-CM

## 2021-03-12 DIAGNOSIS — Z87891 Personal history of nicotine dependence: Secondary | ICD-10-CM | POA: Diagnosis not present

## 2021-03-12 DIAGNOSIS — Z510 Encounter for antineoplastic radiation therapy: Secondary | ICD-10-CM | POA: Diagnosis not present

## 2021-03-12 DIAGNOSIS — E876 Hypokalemia: Secondary | ICD-10-CM

## 2021-03-12 DIAGNOSIS — D509 Iron deficiency anemia, unspecified: Secondary | ICD-10-CM

## 2021-03-12 LAB — CBC WITH DIFFERENTIAL (CANCER CENTER ONLY)
Abs Immature Granulocytes: 0.05 10*3/uL (ref 0.00–0.07)
Basophils Absolute: 0 10*3/uL (ref 0.0–0.1)
Basophils Relative: 1 %
Eosinophils Absolute: 0 10*3/uL (ref 0.0–0.5)
Eosinophils Relative: 1 %
HCT: 27.5 % — ABNORMAL LOW (ref 39.0–52.0)
Hemoglobin: 7.9 g/dL — ABNORMAL LOW (ref 13.0–17.0)
Immature Granulocytes: 2 %
Lymphocytes Relative: 16 %
Lymphs Abs: 0.5 10*3/uL — ABNORMAL LOW (ref 0.7–4.0)
MCH: 21.3 pg — ABNORMAL LOW (ref 26.0–34.0)
MCHC: 28.7 g/dL — ABNORMAL LOW (ref 30.0–36.0)
MCV: 74.1 fL — ABNORMAL LOW (ref 80.0–100.0)
Monocytes Absolute: 0.4 10*3/uL (ref 0.1–1.0)
Monocytes Relative: 10 %
Neutro Abs: 2.4 10*3/uL (ref 1.7–7.7)
Neutrophils Relative %: 70 %
Platelet Count: 286 10*3/uL (ref 150–400)
RBC: 3.71 MIL/uL — ABNORMAL LOW (ref 4.22–5.81)
RDW: 23.9 % — ABNORMAL HIGH (ref 11.5–15.5)
WBC Count: 3.4 10*3/uL — ABNORMAL LOW (ref 4.0–10.5)
nRBC: 0.6 % — ABNORMAL HIGH (ref 0.0–0.2)

## 2021-03-12 LAB — CMP (CANCER CENTER ONLY)
ALT: <5 U/L (ref 0–44)
AST: 9 U/L — ABNORMAL LOW (ref 15–41)
Albumin: 3.4 g/dL — ABNORMAL LOW (ref 3.5–5.0)
Alkaline Phosphatase: 74 U/L (ref 38–126)
Anion gap: 7 (ref 5–15)
BUN: 14 mg/dL (ref 8–23)
CO2: 32 mmol/L (ref 22–32)
Calcium: 9.4 mg/dL (ref 8.9–10.3)
Chloride: 104 mmol/L (ref 98–111)
Creatinine: 0.91 mg/dL (ref 0.61–1.24)
GFR, Estimated: >60 mL/min (ref >60)
Glucose, Bld: 114 mg/dL — ABNORMAL HIGH (ref 70–99)
Potassium: 3.2 mmol/L — ABNORMAL LOW (ref 3.5–5.1)
Sodium: 143 mmol/L (ref 135–145)
Total Bilirubin: 0.3 mg/dL (ref 0.3–1.2)
Total Protein: 6.2 g/dL — ABNORMAL LOW (ref 6.5–8.1)

## 2021-03-12 LAB — SAMPLE TO BLOOD BANK

## 2021-03-12 LAB — PREPARE RBC (CROSSMATCH)

## 2021-03-12 MED ORDER — SODIUM CHLORIDE 0.9 % IV SOLN: Freq: Once | INTRAVENOUS | Status: AC

## 2021-03-12 MED ORDER — SODIUM CHLORIDE 0.9 % IV SOLN
223.6000 mg | Freq: Once | INTRAVENOUS | Status: AC
  Administered 2021-03-12: 220 mg via INTRAVENOUS
  Filled 2021-03-12: qty 22

## 2021-03-12 MED ORDER — PALONOSETRON HCL INJECTION 0.25 MG/5ML
0.2500 mg | Freq: Once | INTRAVENOUS | Status: AC
  Administered 2021-03-12: 0.25 mg via INTRAVENOUS
  Filled 2021-03-12: qty 5

## 2021-03-12 MED ORDER — DIPHENHYDRAMINE HCL 50 MG/ML IJ SOLN
50.0000 mg | Freq: Once | INTRAMUSCULAR | Status: AC
  Administered 2021-03-12: 50 mg via INTRAVENOUS
  Filled 2021-03-12: qty 1

## 2021-03-12 MED ORDER — POTASSIUM CHLORIDE CRYS ER 20 MEQ PO TBCR: 20.0000 meq | EXTENDED_RELEASE_TABLET | Freq: Every day | ORAL | 0 refills | Status: DC

## 2021-03-12 MED ORDER — FAMOTIDINE 20 MG IN NS 100 ML IVPB
20.0000 mg | Freq: Once | INTRAVENOUS | Status: AC
  Administered 2021-03-12: 20 mg via INTRAVENOUS
  Filled 2021-03-12: qty 100

## 2021-03-12 MED ORDER — SODIUM CHLORIDE 0.9 % IV SOLN
10.0000 mg | Freq: Once | INTRAVENOUS | Status: AC
  Administered 2021-03-12: 10 mg via INTRAVENOUS
  Filled 2021-03-12: qty 10

## 2021-03-12 MED ORDER — SODIUM CHLORIDE 0.9 % IV SOLN
45.0000 mg/m2 | Freq: Once | INTRAVENOUS | Status: AC
  Administered 2021-03-12: 102 mg via INTRAVENOUS
  Filled 2021-03-12: qty 17

## 2021-03-12 MED ORDER — ACETAMINOPHEN 325 MG PO TABS: 650.0000 mg | ORAL_TABLET | Freq: Once | ORAL | Status: DC

## 2021-03-12 NOTE — Patient Instructions (Signed)
[facility]  Discharge Instructions: Thank you for choosing [facility] to provide your oncology and hematology care.   If you have a lab appointment with the [facility], please go directly to the [facility] and check in at the registration area.   Wear comfortable clothing and clothing appropriate for easy access to any Portacath or PICC line.   We strive to give you quality time with your provider. You may need to reschedule your appointment if you arrive late (15 or more minutes).  Arriving late affects you and other patients whose appointments are after yours.  Also, if you miss three or more appointments without notifying the office, you may be dismissed from the clinic at the providers discretion.      For prescription refill requests, have your pharmacy contact our office and allow 72 hours for refills to be completed.    Today you received the following chemotherapy and/or immunotherapy agents: Paclitaxel, Carboplatin.       To help prevent nausea and vomiting after your treatment, we encourage you to take your nausea medication as directed.  BELOW ARE SYMPTOMS THAT SHOULD BE REPORTED IMMEDIATELY: *FEVER GREATER THAN 100.4 F (38 C) OR HIGHER *CHILLS OR SWEATING *NAUSEA AND VOMITING THAT IS NOT CONTROLLED WITH YOUR NAUSEA MEDICATION *UNUSUAL SHORTNESS OF BREATH *UNUSUAL BRUISING OR BLEEDING *URINARY PROBLEMS (pain or burning when urinating, or frequent urination) *BOWEL PROBLEMS (unusual diarrhea, constipation, pain near the anus) TENDERNESS IN MOUTH AND THROAT WITH OR WITHOUT PRESENCE OF ULCERS (sore throat, sores in mouth, or a toothache) UNUSUAL RASH, SWELLING OR PAIN  UNUSUAL VAGINAL DISCHARGE OR ITCHING   Items with * indicate a potential emergency and should be followed up as soon as possible or go to the Emergency Department if any problems should occur.  Please show the CHEMOTHERAPY ALERT CARD or IMMUNOTHERAPY ALERT CARD  at check-in to the Emergency Department and triage nurse.  Should you have questions after your visit or need to cancel or reschedule your appointment, please contact [facility]  Dept: [phone]  and follow the prompts.  Office hours are 8:00 a.m. to 4:30 p.m. Monday - Friday. Please note that voicemails left after 4:00 p.m. may not be returned until the following business day.  We are closed weekends and major holidays. You have access to a nurse at all times for urgent questions. Please call the main number to the clinic Dept: [phone] and follow the prompts.   For any non-urgent questions, you may also contact your provider using MyChart. We now offer e-Visits for anyone 49 and older to request care online for non-urgent symptoms. For details visit [email or web address].   Also download the MyChart app! Go to the app store, search "MyChart", open the app, select [facility], and log in with your MyChart username and password.  Due to Covid, a mask is required upon entering the hospital/clinic. If you do not have a mask, one will be given to you upon arrival. For doctor visits, patients may have 1 support person aged 34 or older with them. For treatment visits, patients cannot have anyone with them due to current Covid guidelines and our immunocompromised population.   Blood Transfusion, Adult, Care After This sheet gives you information about how to care for yourself after your procedure. Your health care provider may also give you more specific instructions. If you have problems or questions, contact your health care provider. What can I expect after the  procedure? After the procedure, it is common to have: Bruising and soreness where the IV was inserted. A headache. Follow these instructions at home: IV insertion site care   Follow instructions from your health care provider about how to take care of your IV insertion site. Make sure you: Wash your  hands with soap and water before and after you change your bandage (dressing). If soap and water are not available, use hand sanitizer. Change your dressing as told by your health care provider. Check your IV insertion site every day for signs of infection. Check for: Redness, swelling, or pain. Bleeding from the site. Warmth. Pus or a bad smell. General instructions Take over-the-counter and prescription medicines only as told by your health care provider. Rest as told by your health care provider. Return to your normal activities as told by your health care provider. Keep all follow-up visits as told by your health care provider. This is important. Contact a health care provider if: You have itching or red, swollen areas of skin (hives). You feel anxious. You feel weak after doing your normal activities. You have redness, swelling, warmth, or pain around the IV insertion site. You have blood coming from the IV insertion site that does not stop with pressure. You have pus or a bad smell coming from your IV insertion site. Get help right away if: You have symptoms of a serious allergic or immune system reaction, including: Trouble breathing or shortness of breath. Swelling of the face or feeling flushed. Fever or chills. Pain in the head, back, or chest. Dark urine or blood in the urine. Widespread rash. Fast heartbeat. Feeling dizzy or light-headed. If you receive your blood transfusion in an outpatient setting, you will be told whom to contact to report any reactions. These symptoms may represent a serious problem that is an emergency. Do not wait to see if the symptoms will go away. Get medical help right away. Call your local emergency services (911 in the [place] Do not drive yourself to the hospital. Summary Bruising and tenderness around the IV insertion site are common. Check your IV insertion site every day for signs of infection. Rest as told by your health care provider.  Return to your normal activities as told by your health care provider. Get help right away for symptoms of a serious allergic or immune system reaction to blood transfusion. This information is not intended to replace advice given to you by your health care provider. Make sure you discuss any questions you have with your health care provider. Document Revised: 06/07/2020 Document Reviewed: 08/05/2018 Elsevier Patient Education  [place].

## 2021-03-12 NOTE — Progress Notes (Signed)
[place] [phone]   Fax:([phone] [place], [place] James [name] [id number]  DIAGNOSIS:  1) Stage IIIA/IV (T3, N1, M0/M1a) non-small cell lung cancer, squamous cell carcinoma presented with central area obstructive left lung mass with probably involvement of left hilar adenopathy and suspicious but not confirmed malignant pleural effusion and right hip lesion.  This was diagnosed in December 2022. 2) history of iron deficiency anemia treated with IV iron infusion in the past.  PD-L1: 15%   No actionable mutations by guardant 360.   PRIOR THERAPY: None   CURRENT THERAPY: Concurrent chemoradiation with weekly carboplatin for AUC of 2 and paclitaxel 45 Mg/M2 for 6-7 weeks followed by immunotherapy as the patient has no evidence of disease progression. First dose started on 02/27/20.   INTERVAL HISTORY: James [name] 76 y.o. male returns to the clinic today for follow-up visit accompanied by his wife.  The patient continues to complain of increasing fatigue and weakness as well as dizzy spells and frequent falls.  He has no appetite.  He also complains of sternal pain that radiated to his back.  He denied having any cough or hemoptysis.  He continues to have the baseline shortness of breath and currently on home oxygen.  He has no nausea, vomiting, diarrhea or constipation.  He has no headache or visual changes.  He tolerated the first 2 weeks of his concurrent chemoradiation fairly well.  The patient is here today for evaluation before starting cycle #3.  MEDICAL HISTORY: Past Medical History:  Diagnosis Date   Allergy    seasonal   Blind left eye    legally   Blood transfusion    2002   COPD (chronic obstructive pulmonary disease) (HCC)    Depression    Diabetes mellitus, type 2 (HCC)    Diverticulosis    Dyspnea    ED (erectile dysfunction)    Glaucoma    Hyperlipidemia     Hypertension    Insomnia    Internal and external bleeding hemorrhoids    Iron deficiency anemia    Osteoarthritis    Parkinson's disease (James [name]) 2014   Dr. Shelia [name] PCP  and Dr. Rexene [name] at [facility]   Rosacea    Tremor of both hands    Tubular adenoma of colon 03/28/2011    ALLERGIES:  is allergic to aspirin, codeine, and morphine.  MEDICATIONS:  Current Outpatient Medications  Medication Sig Dispense Refill   apixaban (ELIQUIS) 5 MG TABS tablet Take 1 tablet (5 mg total) by mouth 2 (two) times daily. 60 tablet 3   atorvastatin (LIPITOR) 20 MG tablet Take 20 mg by mouth at bedtime.      buPROPion (WELLBUTRIN XL) 300 MG 24 hr tablet Take 150 mg by mouth 2 (two) times daily.     calcium-vitamin D (OSCAL WITH D) 250-125 MG-UNIT per tablet Take 1 tablet by mouth daily.     carbidopa-levodopa (SINEMET IR) 25-100 MG tablet TAKE 1.5 TABLETS BY MOUTH 3 TIMES DAILY. (Patient taking differently: Take 1.5 tablets by mouth 3 (three) times daily.) 405 tablet 3   cetirizine (ZYRTEC) 10 MG tablet Take 10 mg by mouth 2 (two) times daily.     diltiazem (CARDIZEM CD) 300 MG 24 hr capsule Take 1 capsule (300 mg total) by mouth daily. 30 capsule 3   donepezil (ARICEPT) 10 MG tablet Take 1 tablet (10 mg total) by mouth at bedtime. [place]  tablet 3   ferrous gluconate (FERGON) 324 MG tablet TAKE 1 TABLET BY MOUTH DAILY WITH BREAKFAST (Patient taking differently: 324 mg daily with breakfast.) 90 tablet 3   formoterol (PERFOROMIST) 20 MCG/2ML nebulizer solution Take 2 mLs (20 mcg total) by nebulization 2 (two) times daily. (Patient taking differently: Take 20 mcg by nebulization daily as needed (cough).) 120 mL 5   Green Tea 315 MG CAPS Take 315 mg by mouth daily.     HYDROcodone-acetaminophen (NORCO) 5-325 MG tablet Take 1 tablet by mouth every 6 (six) hours as needed for moderate pain. 30 tablet 0   ibuprofen (ADVIL) 800 MG tablet Take 1 tablet (800 mg total) by mouth every 8 (eight) hours as needed for moderate pain. 30  tablet 1   MEGARED OMEGA-3 KRILL OIL PO Take 1 capsule by mouth daily.     metFORMIN (GLUCOPHAGE) 500 MG tablet Take 1 tablet (500 mg total) by mouth 2 (two) times daily with a meal.     metoprolol tartrate (LOPRESSOR) 25 MG tablet Take 1 tablet (25 mg total) by mouth 2 (two) times daily. Hold if heart rate less than 60 or blood pressure top number is less than 95 180 tablet 3   mirtazapine (REMERON) 15 MG tablet Take 15 mg by mouth at bedtime.     modafinil (PROVIGIL) 100 MG tablet Take 1 tablet (100 mg total) by mouth daily. 30 tablet 5   Multiple Vitamins-Minerals (MULTIVITAMIN WITH MINERALS) tablet Take 1 tablet by mouth daily. Centrum     omeprazole (PRILOSEC OTC) 20 MG tablet Take 20 mg by mouth every evening.     OXYGEN Inhale 4-6 L into the lungs continuous.     prochlorperazine (COMPAZINE) 10 MG tablet Take 1 tablet (10 mg total) by mouth every 6 (six) hours as needed for nausea or vomiting. 30 tablet 0   revefenacin (YUPELRI) 175 MCG/3ML nebulizer solution Take 3 mLs (175 mcg total) by nebulization daily. 90 mL 5   sertraline (ZOLOFT) 100 MG tablet Take 150 mg by mouth daily.     traMADol (ULTRAM) 50 MG tablet Take 1 tablet (50 mg total) by mouth every 6 (six) hours as needed. (Patient taking differently: Take 50 mg by mouth every 6 (six) hours as needed for moderate pain.) 60 tablet 0   lidocaine-prilocaine (EMLA) cream Apply 1 application topically as needed. (Patient not taking: Reported on 03/04/2021) 30 g 2   nitroGLYCERIN (NITROSTAT) 0.4 MG SL tablet Place 1 tablet (0.4 mg total) under the tongue every 5 (five) minutes as needed for chest pain. 25 tablet 3   No current facility-administered medications for this visit.    SURGICAL HISTORY:  Past Surgical History:  Procedure Laterality Date   ARTHROSCOPIC REPAIR ACL     BALLOON DILATION  01/28/2021   Procedure: BALLOON DILATION;  Surgeon: [name], DO;  Location: [place];  Service: Pulmonary;;   BRONCHIAL BIOPSY   01/28/2021   Procedure: BRONCHIAL BIOPSIES;  Surgeon: [name], DO;  Location: [place];  Service: Pulmonary;;   CARDIOVERSION N/A 03/04/2021   Procedure: CARDIOVERSION;  Surgeon: [name], MD;  Location: [place];  Service: Cardiovascular;  Laterality: N/A;   [place]   right   COLONOSCOPY WITH PROPOFOL N/A 07/25/2015   Procedure: COLONOSCOPY WITH PROPOFOL;  Surgeon: [name], MD;  Location: [facility];  Service: Endoscopy;  Laterality: N/A;   CRYOTHERAPY  01/28/2021   Procedure: CRYOTHERAPY;  Surgeon: Josephine Igo, DO;  Location: [facility];  Service: Pulmonary;;   ESOPHAGOGASTRODUODENOSCOPY (EGD) WITH PROPOFOL N/A 07/25/2015   Procedure: ESOPHAGOGASTRODUODENOSCOPY (EGD) WITH PROPOFOL;  Surgeon: Meryl Dare, MD;  Location: [facility];  Service: Endoscopy;  Laterality: N/A;   HEMOSTASIS CONTROL  01/28/2021   Procedure: HEMOSTASIS CONTROL;  Surgeon: Josephine Igo, DO;  Location: [facility];  Service: Pulmonary;;   INTRAVASCULAR PRESSURE WIRE/FFR STUDY N/A 01/22/2021   Procedure: INTRAVASCULAR PRESSURE WIRE/FFR STUDY;  Surgeon: Swaziland, Peter M, MD;  Location: [facility] INVASIVE CV LAB;  Service: Cardiovascular;  Laterality: N/A;   IR THORACENTESIS ASP PLEURAL SPACE W/IMG GUIDE  09/17/2020   IR THORACENTESIS ASP PLEURAL SPACE W/IMG GUIDE  10/25/2020   KNEE ARTHROSCOPY  1996   LEFT HEART CATH AND CORONARY ANGIOGRAPHY N/A 01/22/2021   Procedure: LEFT HEART CATH AND CORONARY ANGIOGRAPHY;  Surgeon: Swaziland, Peter M, MD;  Location: [facility]VASIVE CV LAB;  Service: Cardiovascular;  Laterality: N/A;   PARTIAL COLECTOMY  2008   TEE WITHOUT CARDIOVERSION N/A 03/04/2021   Procedure: TRANSESOPHAGEAL ECHOCARDIOGRAM (TEE);  Surgeon: Meriam Sprague, MD;  Location: [facility] ENDOSCOPY;  Service: Cardiovascular;  Laterality: N/A;   THYROID CYST EXCISION     TONSILLECTOMY     VIDEO BRONCHOSCOPY Left 01/28/2021   Procedure: VIDEO  BRONCHOSCOPY WITHOUT FLUORO;  Surgeon: Josephine Igo, DO;  Location: [facility];  Service: Pulmonary;  Laterality: Left;    REVIEW OF SYSTEMS:  Constitutional: positive for anorexia, fatigue, and weight loss Eyes: negative Ears, nose, mouth, throat, and face: negative Respiratory: positive for dyspnea on exertion and pleurisy/chest pain Cardiovascular: negative Gastrointestinal: negative Genitourinary:negative Integument/breast: negative Hematologic/lymphatic: negative Musculoskeletal:positive for muscle weakness Neurological: negative Behavioral/Psych: negative Endocrine: negative Allergic/Immunologic: negative   PHYSICAL EXAMINATION: General appearance: alert, cooperative, fatigued, and no distress Head: Normocephalic, without obvious abnormality, atraumatic Neck: no adenopathy, no JVD, supple, symmetrical, trachea midline, and thyroid not enlarged, symmetric, no tenderness/mass/nodules Lymph nodes: Cervical, supraclavicular, and axillary nodes normal. Resp: clear to auscultation bilaterally Back: symmetric, no curvature. ROM normal. No CVA tenderness. Cardio: regular rate and rhythm, S1, S2 normal, no murmur, click, rub or gallop GI: soft, non-tender; bowel sounds normal; no masses,  no organomegaly Extremities: extremities normal, atraumatic, no cyanosis or edema Neurologic: Alert and oriented X 3, normal strength and tone. Normal symmetric reflexes. Normal coordination and gait  ECOG PERFORMANCE STATUS: 1 - Symptomatic but completely ambulatory  Blood pressure (!) 106/45, pulse 64, temperature (!) 97.2 F (36.2 C), temperature source Axillary, resp. rate 17, height 6\' 1"  (1.854 m), SpO2 93 %.  LABORATORY DATA: Lab Results  Component Value Date   WBC 3.4 (L) 03/12/2021   HGB 7.9 (L) 03/12/2021   HCT 27.5 (L) 03/12/2021   MCV 74.1 (L) 03/12/2021   PLT 286 03/12/2021      Chemistry      Component Value Date/Time   NA 142 03/05/2021 1249   NA 149 (H) 01/15/2021  1414   K 3.9 03/05/2021 1249   CL 103 03/05/2021 1249   CO2 33 (H) 03/05/2021 1249   BUN 16 03/05/2021 1249   BUN 12 01/15/2021 1414   CREATININE 0.86 03/05/2021 1249      Component Value Date/Time   CALCIUM 9.5 03/05/2021 1249   ALKPHOS 72 03/05/2021 1249   AST 8 (L) 03/05/2021 1249   ALT <5 03/05/2021 1249   BILITOT 0.3 03/05/2021 1249       RADIOGRAPHIC STUDIES: DG Chest 2 View  Result Date: 02/18/2021 CLINICAL DATA:  Weakness EXAM: CHEST - 2 VIEW COMPARISON:  02/11/2021 FINDINGS: Left pleural effusion and extensive airspace disease throughout the left lung with volume loss. Aeration opacities worsening since prior study. Right lung clear. Heart is likely mildly enlarged. Aortic atherosclerosis. IMPRESSION: Volume loss with left effusion and left lung airspace disease, worsened since prior study. Findings concerning for pneumonia. Electronically Signed   By: Rolm Baptise M.D.   On: 02/18/2021 18:02   DG Chest Port 1 View  Result Date: 02/11/2021 CLINICAL DATA:  Pt c/o chest pain, sternum area, constant pain x 1 day that has since improved, SOB, general weakness that has progressively gotten worse. HX: COPD, lung cancer (left side mass) diagnosed December 2022. EXAM: PORTABLE CHEST - 1 VIEW COMPARISON:  10/25/2020 FINDINGS: Interval improved aeration in left upper lung. Persistent dense consolidation/atelectasis in the left lower lung with possible associated effusion. The right lung remains clear. Heart size difficult to assess due to adjacent opacities. Aortic Atherosclerosis (ICD10-170.0). No pneumothorax. Vertebral endplate spurring at multiple levels in the lower thoracic spine. IMPRESSION: Dense left lower lung consolidation/atelectasis with possible effusion Electronically Signed   By: Lucrezia Europe M.D.   On: 02/11/2021 13:38   ECHOCARDIOGRAM COMPLETE  Result Date: 02/12/2021    ECHOCARDIOGRAM REPORT   Patient Name:   JAARON OLESON Date of Exam: 02/12/2021 Medical Rec #:   [id number]         Height:       73.0 in Accession #:    [id number]        Weight:       201.2 lb Date of Birth:  March 02, 1945          BSA:          2.157 m Patient Age:    31 years          BP:           139/61 mmHg Patient Gender: M                 HR:           108 bpm. Exam Location:  Inpatient Procedure: 2D Echo, Cardiac Doppler and Color Doppler Indications:    Aflutter  History:        Patient has prior history of Echocardiogram examinations, most                 recent 06/06/2020. COPD, Arrythmias:Tachycardia,                 Signs/Symptoms:Chest Pain; Risk Factors:Hypertension and                 Diabetes.  Sonographer:    Glo Herring Referring Phys: [id number] [place]  1. Left ventricular ejection fraction, by estimation, is 55 to 60%. The left ventricle has normal function. The left ventricle has no regional wall motion abnormalities. There is mild concentric left ventricular hypertrophy. Left ventricular diastolic function could not be evaluated.  2. Right ventricular systolic function is normal. The right ventricular size is normal. There is normal pulmonary artery systolic pressure.  3. Left atrial size was mild to moderately dilated.  4. Right atrial size was mild to moderately dilated.  5. A small pericardial effusion is present. There is no evidence of cardiac tamponade.  6. The mitral valve is normal in structure. No evidence of mitral valve regurgitation. No evidence of mitral stenosis.  7. The aortic valve is grossly normal. Aortic valve regurgitation is not visualized. No aortic  stenosis is present.  8. There is mild dilatation of the ascending aorta, measuring 40 mm.  9. The inferior vena cava is dilated in size with >50% respiratory variability, suggesting right atrial pressure of 8 mmHg. Comparison(s): Changes from prior study are noted. Now in atrial flutter. Conclusion(s)/Recommendation(s): Otherwise normal echocardiogram, with minor abnormalities described in the  report. In atrial flutter throughout the study. FINDINGS  Left Ventricle: Left ventricular ejection fraction, by estimation, is 55 to 60%. The left ventricle has normal function. The left ventricle has no regional wall motion abnormalities. The left ventricular internal cavity size was normal in size. There is  mild concentric left ventricular hypertrophy. Left ventricular diastolic function could not be evaluated due to nondiagnostic images. Left ventricular diastolic function could not be evaluated. Right Ventricle: The right ventricular size is normal. No increase in right ventricular wall thickness. Right ventricular systolic function is normal. There is normal pulmonary artery systolic pressure. The tricuspid regurgitant velocity is 2.45 m/s, and  with an assumed right atrial pressure of 8 mmHg, the estimated right ventricular systolic pressure is 29.4 mmHg. Left Atrium: Left atrial size was mild to moderately dilated. Right Atrium: Right atrial size was mild to moderately dilated. Pericardium: A small pericardial effusion is present. There is no evidence of cardiac tamponade. Mitral Valve: The mitral valve is normal in structure. No evidence of mitral valve regurgitation. No evidence of mitral valve stenosis. Tricuspid Valve: The tricuspid valve is normal in structure. Tricuspid valve regurgitation is trivial. No evidence of tricuspid stenosis. Aortic Valve: The aortic valve is grossly normal. Aortic valve regurgitation is not visualized. No aortic stenosis is present. Aortic valve mean gradient measures 4.0 mmHg. Aortic valve peak gradient measures 8.0 mmHg. Aortic valve area, by VTI measures 2.87 cm. Pulmonic Valve: The pulmonic valve was not well visualized. Pulmonic valve regurgitation is not visualized. Aorta: The aortic root, ascending aorta, aortic arch and descending aorta are all structurally normal, with no evidence of dilitation or obstruction. There is mild dilatation of the ascending aorta,  measuring 40 mm. Venous: The inferior vena cava is dilated in size with greater than 50% respiratory variability, suggesting right atrial pressure of 8 mmHg. IAS/Shunts: The atrial septum is grossly normal.  LEFT VENTRICLE PLAX 2D LVIDd:         4.70 cm      Diastology LVIDs:         3.20 cm      LV e' medial:    8.49 cm/s LV PW:         1.20 cm      LV E/e' medial:  9.8 LV IVS:        1.20 cm      LV e' lateral:   12.13 cm/s LVOT diam:     2.00 cm      LV E/e' lateral: 6.9 LV SV:         72 LV SV Index:   33 LVOT Area:     3.14 cm  LV Volumes (MOD) LV vol d, MOD A2C: 105.0 ml LV vol d, MOD A4C: 93.3 ml LV vol s, MOD A2C: 46.5 ml LV vol s, MOD A4C: 42.4 ml LV SV MOD A2C:     58.5 ml LV SV MOD A4C:     93.3 ml LV SV MOD BP:      56.7 ml RIGHT VENTRICLE             IVC RV Basal diam:  4.30 cm  IVC diam: 2.50 cm RV S prime:     14.60 cm/s LEFT ATRIUM             Index        RIGHT ATRIUM           Index LA diam:        4.60 cm 2.13 cm/m   RA Area:     28.00 cm LA Vol (A2C):   86.3 ml 40.01 ml/m  RA Volume:   93.50 ml  43.34 ml/m LA Vol (A4C):   56.7 ml 26.29 ml/m LA Biplane Vol: 70.6 ml 32.73 ml/m  AORTIC VALVE                    PULMONIC VALVE AV Area (Vmax):    2.87 cm     PV Vmax:       0.99 m/s AV Area (Vmean):   2.69 cm     PV Peak grad:  3.9 mmHg AV Area (VTI):     2.87 cm AV Vmax:           141.67 cm/s AV Vmean:          94.533 cm/s AV VTI:            0.249 m AV Peak Grad:      8.0 mmHg AV Mean Grad:      4.0 mmHg LVOT Vmax:         129.33 cm/s LVOT Vmean:        80.867 cm/s LVOT VTI:          0.228 m LVOT/AV VTI ratio: 0.91  AORTA Ao Root diam: 4.00 cm Ao Asc diam:  3.40 cm MITRAL VALVE               TRICUSPID VALVE MV Area (PHT): 5.08 cm    TR Peak grad:   24.0 mmHg MV Decel Time: 149 msec    TR Vmax:        245.00 cm/s MV E velocity: 83.13 cm/s                            SHUNTS                            Systemic VTI:  0.23 m                            Systemic Diam: 2.00 cm Buford Dresser MD Electronically signed by Buford Dresser MD Signature Date/Time: 02/12/2021/11:39:35 AM    Final    ECHO TEE  Result Date: 03/04/2021    TRANSESOPHOGEAL ECHO REPORT   Patient Name:   NORIEL GUTHRIE Date of Exam: 03/04/2021 Medical Rec #:  [id number]         Height:       73.0 in Accession #:    [id number]        Weight:       199.1 lb Date of Birth:  07-Aug-1945          BSA:          2.147 m Patient Age:    88 years          BP:           95/74 mmHg Patient Gender: M  HR:           77 bpm. Exam Location:  Inpatient Procedure: Transesophageal Echo, Cardiac Doppler and Color Doppler Indications:     R94.31 Abnormal EKG  History:         Patient has prior history of Echocardiogram examinations, most                  recent 02/12/2021. Abnormal ECG, COPD, Aortic Valve Disease,                  Arrythmias:Tachycardia; Risk Factors:Hypertension, Diabetes and                  Dyslipidemia.  Sonographer:     Roseanna Rainbow RDCS Referring Phys:  [id number] [place] Diagnosing Phys: Gwyndolyn Kaufman MD PROCEDURE: After discussion of the risks and benefits of a TEE, an informed consent was obtained from the patient. The transesophogeal probe was passed without difficulty through the esophogus of the patient. Imaged were obtained with the patient in a left lateral decubitus position. Sedation performed by different physician. The patient was monitored while under deep sedation. Anesthestetic sedation was provided intravenously by Anesthesiology: 186mg  of Propofol, 100mg  of Lidocaine. The patient developed no complications during the procedure. IMPRESSIONS  1. Left ventricular ejection fraction, by estimation, is 55 to 60%. The left ventricle has normal function.  2. Right ventricular systolic function is normal. The right ventricular size is mildly-to-moderately enlarged.  3. Left atrial size was moderately dilated. No left atrial/left atrial appendage thrombus was detected.  4. Right  atrial size was severely dilated.  5. The mitral valve is normal in structure. Mild mitral valve regurgitation.  6. The aortic valve is tricuspid. There is mild calcification of the aortic valve. There is mild thickening of the aortic valve. Aortic valve regurgitation is mild. Aortic valve sclerosis/calcification is present, without any evidence of aortic stenosis.  7. There is mild (Grade II) plaque.  8. Following TEE, the patient underwent successful DCCV with return to NSR. FINDINGS  Left Ventricle: Left ventricular ejection fraction, by estimation, is 55 to 60%. The left ventricle has normal function. The left ventricular internal cavity size was normal in size. Right Ventricle: The right ventricular size is mildly-to-moderately enlarged. No increase in right ventricular wall thickness. Right ventricular systolic function is normal. Left Atrium: Left atrial size was moderately dilated. No left atrial/left atrial appendage thrombus was detected. Right Atrium: Right atrial size was severely dilated. Pericardium: There is no evidence of pericardial effusion. Mitral Valve: The mitral valve is normal in structure. There is mild thickening of the mitral valve leaflet(s). There is mild calcification of the mitral valve leaflet(s). Mild mitral valve regurgitation. Tricuspid Valve: The tricuspid valve is normal in structure. Tricuspid valve regurgitation is mild. Aortic Valve: The aortic valve is tricuspid. There is mild calcification of the aortic valve. There is mild thickening of the aortic valve. Aortic valve regurgitation is mild. Aortic valve sclerosis/calcification is present, without any evidence of aortic stenosis. Pulmonic Valve: The pulmonic valve was normal in structure. Pulmonic valve regurgitation is trivial. Aorta: The aortic root is normal in size and structure. There is mild (Grade II) plaque. IAS/Shunts: No atrial level shunt detected by color flow Doppler.  TRICUSPID VALVE TR Peak grad:   14.7 mmHg TR  Vmax:        192.00 cm/s Gwyndolyn Kaufman MD Electronically signed by Gwyndolyn Kaufman MD Signature Date/Time: 03/04/2021/4:53:54 PM    Final    US THYROID  Result Date: 02/15/2021 CLINICAL DATA:  Abnormal TSH, COVID EXAM: THYROID ULTRASOUND TECHNIQUE: Ultrasound examination of the thyroid gland and adjacent soft tissues was performed. COMPARISON:  02/12/2010 and previous FINDINGS: Parenchymal Echotexture: Moderately heterogenous Isthmus: 0.2 cm thickness, previously 0.3 Right lobe: 4.7 x 2.4 x 1.9 cm, previously 5.6 x 2.7 x 2.6 Left lobe: 4.5 x 2.7 x 2.5 cm, previously 5.1 x 2.1 x 2.7 _________________________________________________________ Estimated total number of nodules >/= 1 cm: 3 Number of spongiform nodules >/=  2 cm not described below (TR1): 0 Number of mixed cystic and solid nodules >/= 1.5 cm not described below ([place]): 0 _________________________________________________________ Nodule 1: 1.5 cm complex cyst, mid right. This was previously measured as 3 separate lesions on previous study of 02/15/2009 but in retrospect is unchanged. Stability for greater than 5 years implies benignity. This nodule does NOT meet TI-RADS criteria for biopsy or dedicated follow-up. Nodule 2: 0.7 cm benign cyst, inferolateral right. Nodule 3: 1 cm hyperechoic nodule, stable since previous. Stability for greater than 5 years implies benignity. This nodule does NOT meet TI-RADS criteria for biopsy or dedicated follow-up. Nodule # 4: Prior biopsy: No Location: Left; inferior Maximum size: 2.1 cm; Other 2 dimensions: 1.7 x 1.8 cm, previously, 1.1 x 0.9 x 0.9 cm Composition: solid/almost completely solid (2) Echogenicity: hypoechoic (2) Shape: not taller-than-wide (0) Margins: ill-defined (0) Echogenic foci: none (0) ACR TI-RADS total points: 4. ACR TI-RADS risk category:  TR 4. Significant change in size (>/= 20% in two dimensions and minimal increase of 2 mm): Yes Change in features: No Change in ACR TI-RADS risk category:  No ACR TI-RADS recommendations: **Given size (>/= 1.5 cm) and appearance, fine needle aspiration of this moderately suspicious nodule should be considered based on TI-RADS criteria. Nodule 5: 0.7 cm complex cyst, mid left; This nodule does NOT meet TI-RADS criteria for biopsy or dedicated follow-up. _________________________________________________________ No regional cervical adenopathy identified. IMPRESSION: 1. Borderline thyromegaly with bilateral nodules. 2. Recommend FNA biopsy of enlarging 2.1 cm moderately suspicious inferior left nodule. The above is in keeping with the ACR TI-RADS recommendations - J Am Coll Radiol 2017;14:587-595. Electronically Signed   By: Lucrezia Europe M.D.   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  This was diagnosed in December 2022. The patient also has a history of iron deficiency anemia and was on treatment with iron infusion. He is currently undergoing a course of concurrent chemoradiation with weekly carboplatin for AUC of 2 and paclitaxel 45 Mg/M2 status post 2 cycles.  The patient has been tolerating this treatment well with no concerning complaints except for fatigue. I recommended for the patient to proceed with cycle #3 today as planned.  He will come back for follow-up visit in 2 weeks for evaluation before starting cycle #5. Regarding the iron deficiency anemia, will arrange for the patient to receive 2 units of PRBCs transfusion this week.  The patient will also continue on the iron infusion with Venofer 300 mg IV weekly for 2 more weeks. He was advised to call immediately if he has any other concerning symptoms in the interval. The  patient voices understanding of current disease status and treatment options and is in agreement  with the current care plan.  All questions were answered. The patient knows to call the clinic with any problems, questions or concerns. We can certainly see the patient much sooner if necessary.  The total time spent in the appointment was 30 minutes.  Disclaimer: This note was dictated with voice recognition software. Similar sounding words can inadvertently be transcribed and may not be corrected upon review.

## 2021-03-13 ENCOUNTER — Ambulatory Visit
Admission: RE | Admit: 2021-03-13 | Discharge: 2021-03-13 | Disposition: A | Discharge status: Home / Self Care | Payer: Medicare Other | Source: Ambulatory Visit | Attending: Radiation Oncology | Admitting: Radiation Oncology

## 2021-03-13 ENCOUNTER — Other Ambulatory Visit: Payer: Self-pay

## 2021-03-13 ENCOUNTER — Other Ambulatory Visit: Payer: Medicare Other

## 2021-03-13 ENCOUNTER — Ambulatory Visit: Payer: Medicare Other

## 2021-03-13 ENCOUNTER — Ambulatory Visit: Payer: Medicare Other | Admitting: Physician Assistant

## 2021-03-13 DIAGNOSIS — Z87891 Personal history of nicotine dependence: Secondary | ICD-10-CM | POA: Diagnosis not present

## 2021-03-13 DIAGNOSIS — Z510 Encounter for antineoplastic radiation therapy: Secondary | ICD-10-CM | POA: Diagnosis not present

## 2021-03-13 DIAGNOSIS — C3432 Malignant neoplasm of lower lobe, left bronchus or lung: Secondary | ICD-10-CM | POA: Diagnosis not present

## 2021-03-13 LAB — TYPE AND SCREEN
ABO/RH(D): O NEG
Antibody Screen: NEGATIVE
Unit division: 0
Unit division: 0

## 2021-03-13 LAB — BPAM RBC
Blood Product Expiration Date: 202302142359
Blood Product Expiration Date: 202302142359
ISSUE DATE / TIME: 202301171109
ISSUE DATE / TIME: 202301171109
Unit Type and Rh: 9500
Unit Type and Rh: 9500

## 2021-03-14 ENCOUNTER — Ambulatory Visit
Admission: RE | Admit: 2021-03-14 | Discharge: 2021-03-14 | Disposition: A | Discharge status: Home / Self Care | Payer: Medicare Other | Source: Ambulatory Visit | Attending: Radiation Oncology | Admitting: Radiation Oncology

## 2021-03-14 ENCOUNTER — Ambulatory Visit (HOSPITAL_COMMUNITY)
Admit: 2021-03-14 | Discharge: 2021-03-14 | Disposition: A | Discharge status: Home / Self Care | Payer: Medicare Other | Attending: Physician Assistant | Admitting: Physician Assistant

## 2021-03-14 ENCOUNTER — Other Ambulatory Visit (HOSPITAL_COMMUNITY): Payer: Medicare Other

## 2021-03-14 ENCOUNTER — Encounter (HOSPITAL_COMMUNITY): Payer: Self-pay | Admitting: Physician Assistant

## 2021-03-14 VITALS — BP 120/56 | HR 63 | Ht 73.0 in | Wt 195.4 lb

## 2021-03-14 DIAGNOSIS — Z7901 Long term (current) use of anticoagulants: Secondary | ICD-10-CM | POA: Diagnosis not present

## 2021-03-14 DIAGNOSIS — I2510 Atherosclerotic heart disease of native coronary artery without angina pectoris: Secondary | ICD-10-CM | POA: Insufficient documentation

## 2021-03-14 DIAGNOSIS — C3490 Malignant neoplasm of unspecified part of unspecified bronchus or lung: Secondary | ICD-10-CM | POA: Diagnosis not present

## 2021-03-14 DIAGNOSIS — E119 Type 2 diabetes mellitus without complications: Secondary | ICD-10-CM | POA: Insufficient documentation

## 2021-03-14 DIAGNOSIS — Z8616 Personal history of COVID-19: Secondary | ICD-10-CM | POA: Insufficient documentation

## 2021-03-14 DIAGNOSIS — D6869 Other thrombophilia: Secondary | ICD-10-CM | POA: Diagnosis not present

## 2021-03-14 DIAGNOSIS — E785 Hyperlipidemia, unspecified: Secondary | ICD-10-CM | POA: Diagnosis not present

## 2021-03-14 DIAGNOSIS — G20 Parkinson's disease: Secondary | ICD-10-CM | POA: Insufficient documentation

## 2021-03-14 DIAGNOSIS — I10 Essential (primary) hypertension: Secondary | ICD-10-CM | POA: Diagnosis not present

## 2021-03-14 DIAGNOSIS — J449 Chronic obstructive pulmonary disease, unspecified: Secondary | ICD-10-CM | POA: Diagnosis not present

## 2021-03-14 DIAGNOSIS — C3432 Malignant neoplasm of lower lobe, left bronchus or lung: Secondary | ICD-10-CM | POA: Diagnosis not present

## 2021-03-14 DIAGNOSIS — I484 Atypical atrial flutter: Secondary | ICD-10-CM | POA: Diagnosis not present

## 2021-03-14 DIAGNOSIS — I4892 Unspecified atrial flutter: Secondary | ICD-10-CM | POA: Insufficient documentation

## 2021-03-14 DIAGNOSIS — Z79899 Other long term (current) drug therapy: Secondary | ICD-10-CM | POA: Insufficient documentation

## 2021-03-14 DIAGNOSIS — Z510 Encounter for antineoplastic radiation therapy: Secondary | ICD-10-CM | POA: Diagnosis not present

## 2021-03-14 DIAGNOSIS — Z87891 Personal history of nicotine dependence: Secondary | ICD-10-CM | POA: Diagnosis not present

## 2021-03-14 NOTE — Progress Notes (Signed)
Primary Care Physician: [name], MD Primary Cardiologist: Dr [name]  Primary Electrophysiologist: none Referring Physician: Dr Roxy Manns James [name] is a 76 y.o. male with a history of stage III/IV lung cancer, Parkinson's, COPD, DM, HTN, HLD, CAD, atrial flutter who presents for consultation in the [facility].  The patient was initially diagnosed with atrial flutter after presenting for his first round of chemotherapy on 02/11/21 and was found to be tachycardic. He was also diagnosed with COVID at that time. Patient is on Eliquis for a CHADS2VASC score of 5. He underwent TEE guided DCCV on 03/04/21. He reports that he has done well since then. He is in SR today, no elevated heart rates at home. He denies any bleeding issues on anticoagulation.   Today, he denies symptoms of palpitations, chest pain, shortness of breath, orthopnea, PND, lower extremity edema, dizziness, presyncope, syncope, snoring, daytime somnolence, bleeding, or neurologic sequela. The patient is tolerating medications without difficulties and is otherwise without complaint today.    Atrial Fibrillation Risk Factors:  he does not have symptoms or diagnosis of sleep apnea. he does not have a history of rheumatic fever.   he has a BMI of Body mass index is 25.78 kg/m.Marland Kitchen Filed Weights   03/14/21 1053  Weight: 88.6 kg    Family History  Problem Relation Age of Onset   CAD Father    Alcohol abuse Father    Heart disease Father    COPD Mother    Asthma Mother    Emphysema Mother    Cancer Maternal Grandmother    Colon cancer Neg Hx      Atrial Fibrillation Management history:  Previous antiarrhythmic drugs: none Previous cardioversions: 03/04/21 Previous ablations: none CHADS2VASC score: 5 Anticoagulation history: Eliquis   Past Medical History:  Diagnosis Date   Allergy    seasonal   Blind left eye    legally   Blood transfusion    2002   COPD (chronic  obstructive pulmonary disease) ([place])    Depression    Diabetes mellitus, type 2 (HCC)    Diverticulosis    Dyspnea    ED (erectile dysfunction)    Glaucoma    Hyperlipidemia    Hypertension    Insomnia    Internal and external bleeding hemorrhoids    Iron deficiency anemia    Osteoarthritis    Parkinson's disease ([place]) 2014   Dr. [name] PCP  and Dr. [name] at [facility]   Rosacea    Tremor of both hands    Tubular adenoma of colon 03/28/2011   Past Surgical History:  Procedure Laterality Date   ARTHROSCOPIC REPAIR ACL     BALLOON DILATION  01/28/2021   Procedure: BALLOON DILATION;  Surgeon: [name], DO;  Location: [facility];  Service: Pulmonary;;   BRONCHIAL BIOPSY  01/28/2021   Procedure: BRONCHIAL BIOPSIES;  Surgeon: [name], DO;  Location: [place];  Service: Pulmonary;;   CARDIOVERSION N/A 03/04/2021   Procedure: CARDIOVERSION;  Surgeon: [name], MD;  Location: [place];  Service: Cardiovascular;  Laterality: N/A;   [place]   right   COLONOSCOPY WITH PROPOFOL N/A 07/25/2015   Procedure: COLONOSCOPY WITH PROPOFOL;  Surgeon: [name], MD;  Location: [facility];  Service: Endoscopy;  Laterality: N/A;   CRYOTHERAPY  01/28/2021   Procedure: CRYOTHERAPY;  Surgeon: [name], DO;  Location: [place];  Service:  Pulmonary;;   ESOPHAGOGASTRODUODENOSCOPY (EGD) WITH PROPOFOL N/A 07/25/2015   Procedure: ESOPHAGOGASTRODUODENOSCOPY (EGD) WITH PROPOFOL;  Surgeon: [name], MD;  Location: [facility];  Service: Endoscopy;  Laterality: N/A;   HEMOSTASIS CONTROL  01/28/2021   Procedure: HEMOSTASIS CONTROL;  Surgeon: [name], DO;  Location: [place];  Service: Pulmonary;;   INTRAVASCULAR PRESSURE WIRE/FFR STUDY N/A 01/22/2021   Procedure: INTRAVASCULAR PRESSURE WIRE/FFR STUDY;  Surgeon: [name], Peter M, MD;  Location: [place] [facility];  Service: Cardiovascular;  Laterality: N/A;   IR  THORACENTESIS ASP PLEURAL SPACE W/IMG GUIDE  09/17/2020   IR THORACENTESIS ASP PLEURAL SPACE W/IMG GUIDE  10/25/2020   KNEE ARTHROSCOPY  1996   LEFT HEART CATH AND CORONARY ANGIOGRAPHY N/A 01/22/2021   Procedure: LEFT HEART CATH AND CORONARY ANGIOGRAPHY;  Surgeon: [name], Peter M, MD;  Location: [place] [facility];  Service: Cardiovascular;  Laterality: N/A;   PARTIAL COLECTOMY  2008   TEE WITHOUT CARDIOVERSION N/A 03/04/2021   Procedure: TRANSESOPHAGEAL ECHOCARDIOGRAM (TEE);  Surgeon: [name], MD;  Location: [facility];  Service: Cardiovascular;  Laterality: N/A;   THYROID CYST EXCISION     TONSILLECTOMY     VIDEO BRONCHOSCOPY Left 01/28/2021   Procedure: VIDEO BRONCHOSCOPY WITHOUT FLUORO;  Surgeon: [name], DO;  Location: [place];  Service: Pulmonary;  Laterality: Left;    Current Outpatient Medications  Medication Sig Dispense Refill   apixaban (ELIQUIS) 5 MG TABS tablet Take 1 tablet (5 mg total) by mouth 2 (two) times daily. 60 tablet 3   atorvastatin (LIPITOR) 20 MG tablet Take 20 mg by mouth at bedtime.      buPROPion (WELLBUTRIN XL) 300 MG 24 hr tablet Take 150 mg by mouth 2 (two) times daily.     calcium-vitamin D (OSCAL WITH D) 250-125 MG-UNIT per tablet Take 1 tablet by mouth daily.     carbidopa-levodopa (SINEMET IR) 25-100 MG tablet TAKE 1.5 TABLETS BY MOUTH 3 TIMES DAILY. 405 tablet 3   cetirizine (ZYRTEC) 10 MG tablet Take 10 mg by mouth 2 (two) times daily.     diltiazem (CARDIZEM CD) 300 MG 24 hr capsule Take 1 capsule (300 mg total) by mouth daily. 30 capsule 3   donepezil (ARICEPT) 10 MG tablet Take 1 tablet (10 mg total) by mouth at bedtime. 90 tablet 3   ferrous gluconate (FERGON) 324 MG tablet TAKE 1 TABLET BY MOUTH DAILY WITH BREAKFAST 90 tablet 3   formoterol (PERFOROMIST) 20 MCG/2ML nebulizer solution Take 2 mLs (20 mcg total) by nebulization 2 (two) times daily. 120 mL 5   Green Tea 315 MG CAPS Take 315 mg by mouth daily.      HYDROcodone-acetaminophen (NORCO) 5-325 MG tablet Take 1 tablet by mouth every 6 (six) hours as needed for moderate pain. 30 tablet 0   ibuprofen (ADVIL) 800 MG tablet Take 1 tablet (800 mg total) by mouth every 8 (eight) hours as needed for moderate pain. 30 tablet 1   lidocaine-prilocaine (EMLA) cream Apply 1 application topically as needed. 30 g 2   MEGARED OMEGA-3 KRILL OIL PO Take 1 capsule by mouth daily.     metFORMIN (GLUCOPHAGE) 500 MG tablet Take 1 tablet (500 mg total) by mouth 2 (two) times daily with a meal.     metoprolol tartrate (LOPRESSOR) 25 MG tablet Take 1 tablet (25 mg total) by mouth 2 (two) times daily. Hold if heart rate less than 60 or blood pressure top number is less than 95 180  tablet 3   mirtazapine (REMERON) 15 MG tablet Take 15 mg by mouth at bedtime.     modafinil (PROVIGIL) 100 MG tablet Take 1 tablet (100 mg total) by mouth daily. 30 tablet 5   Multiple Vitamins-Minerals (MULTIVITAMIN WITH MINERALS) tablet Take 1 tablet by mouth daily. Centrum     nitroGLYCERIN (NITROSTAT) 0.4 MG SL tablet Place 1 tablet (0.4 mg total) under the tongue every 5 (five) minutes as needed for chest pain. 25 tablet 3   omeprazole (PRILOSEC OTC) 20 MG tablet Take 20 mg by mouth every evening.     OXYGEN Inhale 4-6 L into the lungs continuous.     potassium chloride SA (KLOR-CON M) 20 MEQ tablet Take 1 tablet (20 mEq total) by mouth daily. 5 tablet 0   prochlorperazine (COMPAZINE) 10 MG tablet Take 1 tablet (10 mg total) by mouth every 6 (six) hours as needed for nausea or vomiting. 30 tablet 0   revefenacin (YUPELRI) 175 MCG/3ML nebulizer solution Take 3 mLs (175 mcg total) by nebulization daily. 90 mL 5   sertraline (ZOLOFT) 100 MG tablet Take 150 mg by mouth daily.     traMADol (ULTRAM) 50 MG tablet Take 1 tablet (50 mg total) by mouth every 6 (six) hours as needed. (Patient taking differently: Take 50 mg by mouth every 6 (six) hours as needed for moderate pain.) 60 tablet 0   No  current facility-administered medications for this encounter.    Allergies  Allergen Reactions   Aspirin Other (See Comments)    GI bleed   Codeine Itching   Morphine Itching    Social History   Socioeconomic History   Marital status: Married    Spouse name: Not on file   Number of children: 1   Years of education: HS   Highest education level: Not on file  Occupational History   Occupation: retired    Fish farm manager: RETIRED  Tobacco Use   Smoking status: Former    Packs/day: 1.50    Years: 50.00    Pack years: 75.00    Types: Cigarettes    Quit date: 04/30/2015    Years since quitting: 5.8   Smokeless tobacco: Never   Tobacco comments:    Former smoker and stop sneaking cigarettes 03/14/21  Vaping Use   Vaping Use: Never used  Substance and Sexual Activity   Alcohol use: Not Currently    Comment: once yearly   Drug use: No   Sexual activity: Not on file  Other Topics Concern   Not on file  Social History Narrative   Drinks 1-2 Pepsi a day    Social Determinants of Health   Financial Resource Strain: Not on file  Food Insecurity: Not on file  Transportation Needs: Not on file  Physical Activity: Not on file  Stress: Not on file  Social Connections: Not on file  Intimate Partner Violence: Not on file     ROS- All systems are reviewed and negative except as per the HPI above.  Physical Exam: Vitals:   03/14/21 1053  BP: (!) 120/56  Pulse: 63  Weight: 88.6 kg  Height: 6\' 1"  (1.854 m)    GEN- The patient is a well appearing elderly male, alert and oriented x 3 today.   Head- normocephalic, atraumatic Eyes-  Sclera clear, conjunctiva pink Ears- hearing intact Oropharynx- clear Neck- supple  Lungs- diffuse coarse breath sounds, normal work of breathing Heart- Regular rate and rhythm, no murmurs, rubs or gallops  GI- soft, NT,  ND, + BS Extremities- no clubbing, cyanosis, or edema MS- no significant deformity or atrophy Skin- no rash or lesion Psych-  euthymic mood, full affect Neuro- strength and sensation are intact  Wt Readings from Last 3 Encounters:  03/14/21 88.6 kg  03/05/21 90.5 kg  03/04/21 90.3 kg    EKG today demonstrates  SR Vent. rate 63 BPM PR interval 172 ms QRS duration 88 ms QT/QTcB 442/452 ms  Echo 02/12/21 demonstrated  1. Left ventricular ejection fraction, by estimation, is 55 to 60%. The  left ventricle has normal function. The left ventricle has no regional  wall motion abnormalities. There is mild concentric left ventricular  hypertrophy. Left ventricular diastolic function could not be evaluated.   2. Right ventricular systolic function is normal. The right ventricular  size is normal. There is normal pulmonary artery systolic pressure.   3. Left atrial size was mild to moderately dilated.   4. Right atrial size was mild to moderately dilated.   5. A small pericardial effusion is present. There is no evidence of  cardiac tamponade.   6. The mitral valve is normal in structure. No evidence of mitral valve regurgitation. No evidence of mitral stenosis.   7. The aortic valve is grossly normal. Aortic valve regurgitation is not visualized. No aortic stenosis is present.   8. There is mild dilatation of the ascending aorta, measuring 40 mm.   9. The inferior vena cava is dilated in size with >50% respiratory  variability, suggesting right atrial pressure of 8 mmHg.   Comparison(s): Changes from prior study are noted. Now in atrial flutter.   Epic records are reviewed at length today  CHA2DS2-VASc Score = 5  The patient's score is based upon: CHF History: 0 HTN History: 1 Diabetes History: 1 Stroke History: 0 Vascular Disease History: 1 Age Score: 2 Gender Score: 0       ASSESSMENT AND PLAN: 1. Atrial flutter The patient's CHA2DS2-VASc score is 5, indicating a 7.2% annual risk of stroke.   S/p TEE/DCCV on 03/04/21 Patient appears to be maintaining SR. Continue Lopressor 25 mg BID Continue  diltiazem 300 mg daily Continue Eliquis 5 mg BID  2. Secondary Hypercoagulable State (ICD10:  D68.69) The patient is at significant risk for stroke/thromboembolism based upon his CHA2DS2-VASc Score of 5.  Continue Apixaban (Eliquis).   3. HTN Stable, no changes today.  4. CAD Nonobstructive on LHC No anginal symptoms. On statin   Follow up with Dr [name] as scheduled.    [facility] [address] [place], [place] [address] [phone] 03/14/2021 11:10 AM

## 2021-03-15 ENCOUNTER — Other Ambulatory Visit (HOSPITAL_COMMUNITY): Payer: Self-pay | Admitting: Physician Assistant

## 2021-03-15 ENCOUNTER — Other Ambulatory Visit: Payer: Self-pay

## 2021-03-15 ENCOUNTER — Ambulatory Visit
Admission: RE | Admit: 2021-03-15 | Discharge: 2021-03-15 | Disposition: A | Discharge status: Home / Self Care | Payer: Medicare Other | Source: Ambulatory Visit | Attending: Radiation Oncology | Admitting: Radiation Oncology

## 2021-03-15 DIAGNOSIS — Z87891 Personal history of nicotine dependence: Secondary | ICD-10-CM | POA: Diagnosis not present

## 2021-03-15 DIAGNOSIS — Z510 Encounter for antineoplastic radiation therapy: Secondary | ICD-10-CM | POA: Diagnosis not present

## 2021-03-15 DIAGNOSIS — C3432 Malignant neoplasm of lower lobe, left bronchus or lung: Secondary | ICD-10-CM | POA: Diagnosis not present

## 2021-03-15 DIAGNOSIS — E042 Nontoxic multinodular goiter: Secondary | ICD-10-CM | POA: Diagnosis not present

## 2021-03-15 DIAGNOSIS — E119 Type 2 diabetes mellitus without complications: Secondary | ICD-10-CM | POA: Diagnosis not present

## 2021-03-15 DIAGNOSIS — G20 Parkinson's disease: Secondary | ICD-10-CM | POA: Diagnosis not present

## 2021-03-15 DIAGNOSIS — J9621 Acute and chronic respiratory failure with hypoxia: Secondary | ICD-10-CM | POA: Diagnosis not present

## 2021-03-15 DIAGNOSIS — I4892 Unspecified atrial flutter: Secondary | ICD-10-CM | POA: Diagnosis not present

## 2021-03-15 DIAGNOSIS — U071 COVID-19: Secondary | ICD-10-CM | POA: Diagnosis not present

## 2021-03-15 DIAGNOSIS — E0590 Thyrotoxicosis, unspecified without thyrotoxic crisis or storm: Secondary | ICD-10-CM | POA: Diagnosis not present

## 2021-03-18 ENCOUNTER — Ambulatory Visit: Payer: Medicare Other

## 2021-03-18 ENCOUNTER — Ambulatory Visit (HOSPITAL_COMMUNITY)
Admission: RE | Admit: 2021-03-18 | Discharge: 2021-03-18 | Disposition: A | Discharge status: Home / Self Care | Payer: Medicare Other | Source: Ambulatory Visit | Attending: Physician Assistant | Admitting: Physician Assistant

## 2021-03-18 ENCOUNTER — Other Ambulatory Visit (HOSPITAL_COMMUNITY): Payer: Self-pay | Admitting: Physician Assistant

## 2021-03-18 ENCOUNTER — Inpatient Hospital Stay: Payer: Medicare Other

## 2021-03-18 ENCOUNTER — Other Ambulatory Visit: Payer: Self-pay

## 2021-03-18 VITALS — BP 116/55 | HR 57 | Temp 97.7°F | Resp 18

## 2021-03-18 DIAGNOSIS — G20 Parkinson's disease: Secondary | ICD-10-CM | POA: Insufficient documentation

## 2021-03-18 DIAGNOSIS — E785 Hyperlipidemia, unspecified: Secondary | ICD-10-CM | POA: Insufficient documentation

## 2021-03-18 DIAGNOSIS — Z87891 Personal history of nicotine dependence: Secondary | ICD-10-CM | POA: Diagnosis not present

## 2021-03-18 DIAGNOSIS — J449 Chronic obstructive pulmonary disease, unspecified: Secondary | ICD-10-CM | POA: Diagnosis not present

## 2021-03-18 DIAGNOSIS — D509 Iron deficiency anemia, unspecified: Secondary | ICD-10-CM

## 2021-03-18 DIAGNOSIS — I10 Essential (primary) hypertension: Secondary | ICD-10-CM | POA: Diagnosis not present

## 2021-03-18 DIAGNOSIS — E119 Type 2 diabetes mellitus without complications: Secondary | ICD-10-CM | POA: Diagnosis not present

## 2021-03-18 DIAGNOSIS — C3432 Malignant neoplasm of lower lobe, left bronchus or lung: Secondary | ICD-10-CM | POA: Diagnosis not present

## 2021-03-18 DIAGNOSIS — Z452 Encounter for adjustment and management of vascular access device: Secondary | ICD-10-CM | POA: Diagnosis not present

## 2021-03-18 HISTORY — PX: IR IMAGING GUIDED PORT INSERTION: IMG5740

## 2021-03-18 LAB — GLUCOSE, CAPILLARY: Glucose-Capillary: 111 mg/dL — ABNORMAL HIGH (ref 70–99)

## 2021-03-18 IMAGING — XA IR IMAGING GUIDED PORT INSERTION
1 series · 1 of 1 positions shown · non-contrast
Comparison: none

INDICATION: 75-year-old male referred for port catheter

[Series 1: ir fluoro/shunt/fist · 1 of 1 slices shown]
[im 1/1]
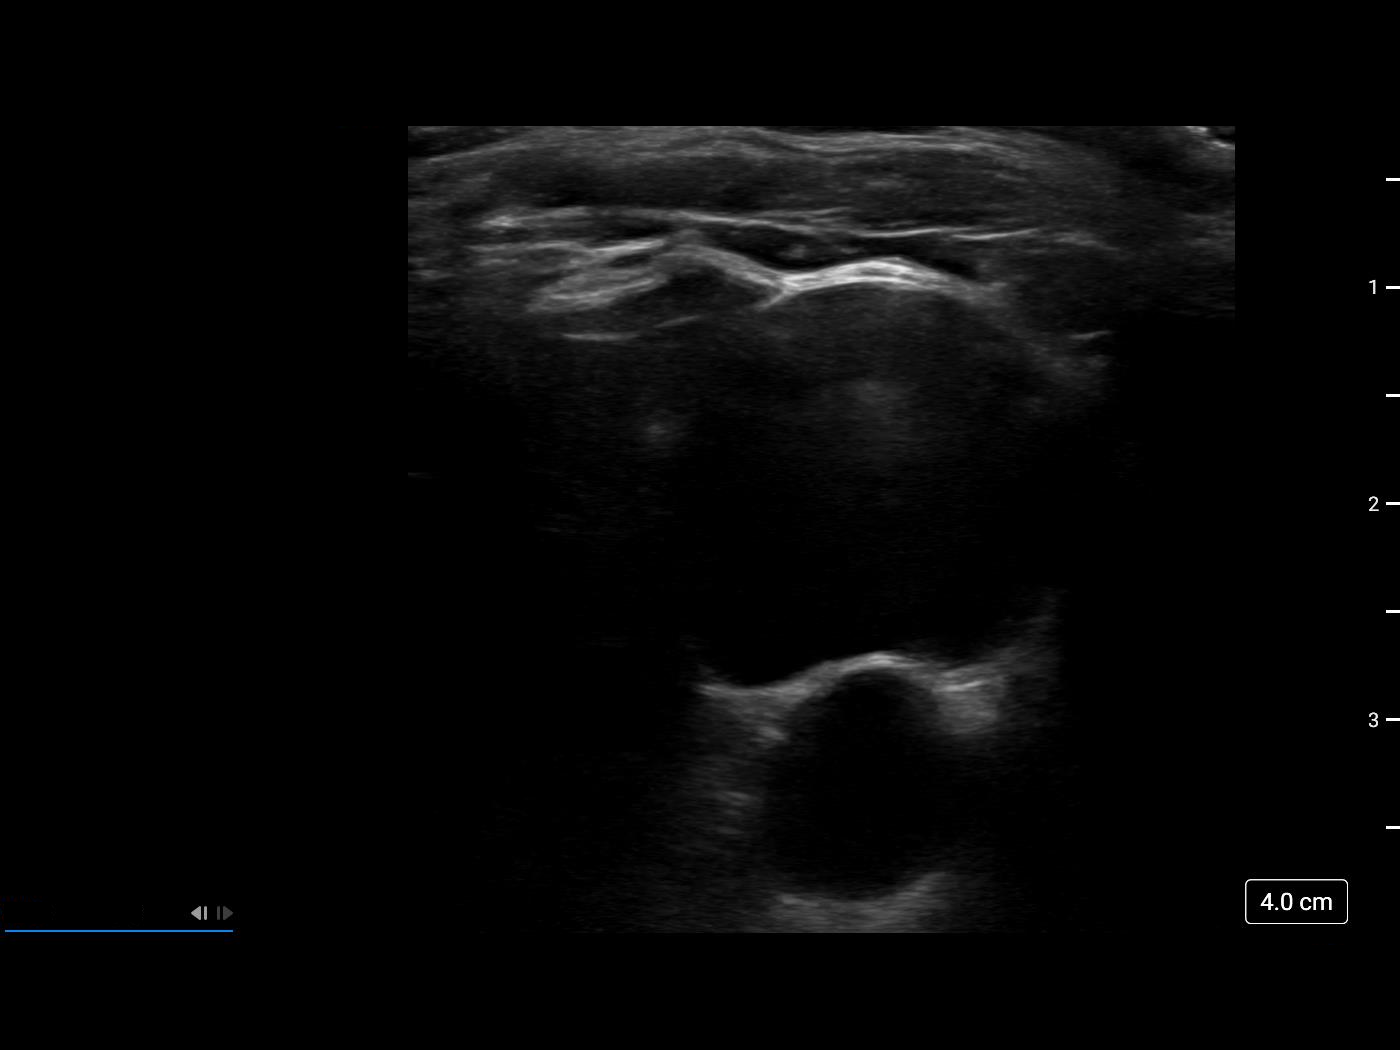

[1 of 1 positions shown; findings below may reference images not displayed]

EXAM:
IMAGE GUIDED PORT CATHETER

MEDICATIONS:
None

ANESTHESIA/SEDATION:
Moderate (conscious) sedation was employed during this procedure. A
total of Versed 1.0 mg and Fentanyl 50 mcg was administered
intravenously.

Moderate Sedation Time: 20 minutes. The patient's level of
consciousness and vital signs were monitored continuously by
radiology nursing throughout the procedure under my direct
supervision.

FLUOROSCOPY TIME:  Fluoroscopy Time: 0 minutes 18 seconds (2 mGy).

COMPLICATIONS:
None

PROCEDURE:
The procedure, risks, benefits, and alternatives were explained to
the patient. Questions regarding the procedure were encouraged and
answered. The patient understands and consents to the procedure.

Ultrasound survey was performed with images stored and sent to PACs.
Right IJ vein documented to be patent.

The right neck and chest was prepped with chlorhexidine, and draped
in the usual sterile fashion using maximum barrier technique (cap
and mask, sterile gown, sterile gloves, large sterile sheet, hand
hygiene and cutaneous antiseptic). Local anesthesia was attained by
infiltration with 1% lidocaine without epinephrine.

Ultrasound demonstrated patency of the right internal jugular vein,
and this was documented with an image. Under real-time ultrasound
guidance, this vein was accessed with a 21 gauge micropuncture
needle and image documentation was performed. A small dermatotomy
was made at the access site with an 11 scalpel. A 0.018" wire was
advanced into the SVC and used to estimate the length of the
internal catheter. The access needle exchanged for a 4F
micropuncture vascular sheath. The 0.018" wire was then removed and
a 0.035" wire advanced into the IVC.



The venous access site was then serially dilated and a peel away
vascular sheath placed over the wire. The wire was removed and the
port catheter advanced into position under fluoroscopic guidance.
The catheter tip is positioned in the cavoatrial junction. This was
documented with a spot image. The portacatheter was then tested and
found to flush and aspirate well. The port was flushed with saline
followed by 100 units/mL heparinized saline.

The pocket was then closed in two layers using first subdermal
inverted interrupted absorbable sutures followed by a running
subcuticular suture. The epidermis was then sealed with Dermabond.
The dermatotomy at the venous access site was also seal with
Dermabond.

Patient tolerated the procedure well and remained hemodynamically
stable throughout.

No complications encountered and no significant blood loss
encountered
IMPRESSION: Status post right IJ port catheter

## 2021-03-18 MED ORDER — DIPHENHYDRAMINE HCL 25 MG PO CAPS
25.0000 mg | ORAL_CAPSULE | Freq: Once | ORAL | Status: AC
  Administered 2021-03-18: 25 mg via ORAL
  Filled 2021-03-18: qty 1

## 2021-03-18 MED ORDER — SODIUM CHLORIDE 0.9 % IV SOLN
300.0000 mg | Freq: Once | INTRAVENOUS | Status: AC
  Administered 2021-03-18: 300 mg via INTRAVENOUS
  Filled 2021-03-18: qty 300

## 2021-03-18 MED ORDER — LIDOCAINE HCL 1 % IJ SOLN
INTRAMUSCULAR | Status: AC
  Filled 2021-03-18: qty 20

## 2021-03-18 MED ORDER — LIDOCAINE HCL (PF) 1 % IJ SOLN
INTRAMUSCULAR | Status: AC | PRN
  Administered 2021-03-18: 20 mL

## 2021-03-18 MED ORDER — SODIUM CHLORIDE 0.9 % IV SOLN: INTRAVENOUS | Status: DC

## 2021-03-18 MED ORDER — FENTANYL CITRATE (PF) 100 MCG/2ML IJ SOLN
INTRAMUSCULAR | Status: AC | PRN
Start: 2021-03-18 — End: 2021-03-18
  Administered 2021-03-18: 50 ug via INTRAVENOUS

## 2021-03-18 MED ORDER — LIDOCAINE-EPINEPHRINE (PF) 1 %-1:200000 IJ SOLN: INTRAMUSCULAR | Status: AC | PRN

## 2021-03-18 MED ORDER — ACETAMINOPHEN 325 MG PO TABS
650.0000 mg | ORAL_TABLET | Freq: Once | ORAL | Status: AC
  Administered 2021-03-18: 650 mg via ORAL
  Filled 2021-03-18: qty 2

## 2021-03-18 MED ORDER — HEPARIN SOD (PORK) LOCK FLUSH 100 UNIT/ML IV SOLN
INTRAVENOUS | Status: AC
  Administered 2021-03-18: 5 [IU]
  Filled 2021-03-18: qty 5

## 2021-03-18 MED ORDER — FENTANYL CITRATE (PF) 100 MCG/2ML IJ SOLN
INTRAMUSCULAR | Status: AC
  Filled 2021-03-18: qty 2

## 2021-03-18 MED ORDER — MIDAZOLAM HCL 2 MG/2ML IJ SOLN
INTRAMUSCULAR | Status: AC
  Filled 2021-03-18: qty 2

## 2021-03-18 MED ORDER — SODIUM CHLORIDE 0.9 % IV SOLN: Freq: Once | INTRAVENOUS | Status: AC

## 2021-03-18 MED ORDER — MIDAZOLAM HCL 2 MG/2ML IJ SOLN
INTRAMUSCULAR | Status: AC | PRN
Start: 2021-03-18 — End: 2021-03-18
  Administered 2021-03-18: 1 mg via INTRAVENOUS

## 2021-03-18 MED FILL — Dexamethasone Sodium Phosphate Inj 100 MG/10ML: INTRAMUSCULAR | Qty: 1 | Status: AC

## 2021-03-18 NOTE — H&P (Signed)
Referring Physician(s): Mohamed,M  Supervising Physician: Corrie Mckusick  Patient Status:  [facility]  Chief Complaint: "I'm getting a port a cath"   Subjective: Patient familiar to IR service from left thoracentesis x2 in 2022. He is a remote smoker and has a history of stage IIIA/IV (T3, N1, M0/M1a) non-small cell lung cancer, squamous cell carcinoma and presented with central area obstructive left lung mass with  left hilar adenopathy and suspicious but not confirmed malignant pleural effusion and right hip lesion.  This was diagnosed in December 2022.  Past medical history also significant for blindness in the left eye, COPD, depression, diabetes, diverticulosis, glaucoma, hypertension, hyperlipidemia, anemia, arthritis, and Parkinson's disease.  He presents today for Port-A-Cath placement to assist with treatment. He currently denies fever, headache, chest pain, abdominal/back pain, nausea, vomiting or bleeding.  Does have some chronic dyspnea with exertion and occasional cough.  Past Medical History:  Diagnosis Date   Allergy    seasonal   Blind left eye    legally   Blood transfusion    2002   COPD (chronic obstructive pulmonary disease) (HCC)    Depression    Diabetes mellitus, type 2 (HCC)    Diverticulosis    Dyspnea    ED (erectile dysfunction)    Glaucoma    Hyperlipidemia    Hypertension    Insomnia    Internal and external bleeding hemorrhoids    Iron deficiency anemia    Osteoarthritis    Parkinson's disease ([place]) 2014   Dr. [name] PCP  and Dr. [name] at [facility]   Rosacea    Tremor of both hands    Tubular adenoma of colon 03/28/2011   Past Surgical History:  Procedure Laterality Date   ARTHROSCOPIC REPAIR ACL     BALLOON DILATION  01/28/2021   Procedure: BALLOON DILATION;  Surgeon: [name], DO;  Location: [place];  Service: Pulmonary;;   BRONCHIAL BIOPSY  01/28/2021   Procedure: BRONCHIAL BIOPSIES;  Surgeon: [name], DO;  Location: [place];  Service: Pulmonary;;   CARDIOVERSION N/A 03/04/2021   Procedure: CARDIOVERSION;  Surgeon: [name], MD;  Location: [place];  Service: Cardiovascular;  Laterality: N/A;   [facility]   right   COLONOSCOPY WITH PROPOFOL N/A 07/25/2015   Procedure: COLONOSCOPY WITH PROPOFOL;  Surgeon: [name], MD;  [facility];  Service: Endoscopy;  Laterality: N/A;   CRYOTHERAPY  01/28/2021   Procedure: CRYOTHERAPY;  Surgeon: [name], DO;  Location: [place] [facility];  Service: Pulmonary;;   ESOPHAGOGASTRODUODENOSCOPY (EGD) WITH PROPOFOL N/A 07/25/2015   Procedure: ESOPHAGOGASTRODUODENOSCOPY (EGD) WITH PROPOFOL;  Surgeon: [name], MD;  [facility];  Service: Endoscopy;  Laterality: N/A;   HEMOSTASIS CONTROL  01/28/2021   Procedure: HEMOSTASIS CONTROL;  Surgeon: [name], DO;  Location: [place];  Service: Pulmonary;;   INTRAVASCULAR PRESSURE WIRE/FFR STUDY N/A 01/22/2021   Procedure: INTRAVASCULAR PRESSURE WIRE/FFR STUDY;  Surgeon: [name], Peter M, MD;  Location: [place] [facility];  Service: Cardiovascular;  Laterality: N/A;   IR THORACENTESIS ASP PLEURAL SPACE W/IMG GUIDE  09/17/2020   IR THORACENTESIS ASP PLEURAL SPACE W/IMG GUIDE  10/25/2020   KNEE ARTHROSCOPY  1996   LEFT HEART CATH AND CORONARY ANGIOGRAPHY N/A 01/22/2021   Procedure: LEFT HEART CATH AND CORONARY ANGIOGRAPHY;  Surgeon: [name], Peter M, MD;  Location: [place] [facility];  Service: Cardiovascular;  Laterality: N/A;  PARTIAL COLECTOMY  2008   TEE WITHOUT CARDIOVERSION N/A 03/04/2021   Procedure: TRANSESOPHAGEAL ECHOCARDIOGRAM (TEE);  Surgeon: [name], MD;  Location: [facility];  Service: Cardiovascular;  Laterality: N/A;   THYROID CYST EXCISION     TONSILLECTOMY     VIDEO BRONCHOSCOPY Left 01/28/2021   Procedure: VIDEO BRONCHOSCOPY WITHOUT FLUORO;  Surgeon: [name], DO;  Location: [place];  Service:  Pulmonary;  Laterality: Left;          Allergies: Aspirin, Codeine, and Morphine  Medications: Prior to Admission medications   Medication Sig Start Date End Date Taking? Authorizing Provider  apixaban (ELIQUIS) 5 MG TABS tablet Take 1 tablet (5 mg total) by mouth 2 (two) times daily. 02/16/21   Dwyane Dee, MD  atorvastatin (LIPITOR) 20 MG tablet Take 20 mg by mouth at bedtime.  05/29/12   [provider]  buPROPion (WELLBUTRIN XL) 300 MG 24 hr tablet Take 150 mg by mouth 2 (two) times daily. 05/31/12   [provider]  calcium-vitamin D (OSCAL WITH D) 250-125 MG-UNIT per tablet Take 1 tablet by mouth daily.    [provider]  carbidopa-levodopa (SINEMET IR) 25-100 MG tablet TAKE 1.5 TABLETS BY MOUTH 3 TIMES DAILY. 02/05/21   Lomax, Amy, NP  cetirizine (ZYRTEC) 10 MG tablet Take 10 mg by mouth 2 (two) times daily.    [provider]  diltiazem (CARDIZEM CD) 300 MG 24 hr capsule Take 1 capsule (300 mg total) by mouth daily. 02/17/21   Dwyane Dee, MD  donepezil (ARICEPT) 10 MG tablet Take 1 tablet (10 mg total) by mouth at bedtime. 05/08/20   Star Age, MD  ferrous gluconate (FERGON) 324 MG tablet TAKE 1 TABLET BY MOUTH DAILY WITH BREAKFAST 09/26/19   Nicholas Lose, MD  formoterol (PERFOROMIST) 20 MCG/2ML nebulizer solution Take 2 mLs (20 mcg total) by nebulization 2 (two) times daily. 10/01/20   Chesley Mires, MD  Nyoka Cowden Tea 315 MG CAPS Take 315 mg by mouth daily.    [provider]  HYDROcodone-acetaminophen (NORCO) 5-325 MG tablet Take 1 tablet by mouth every 6 (six) hours as needed for moderate pain. 02/26/21   Heilingoetter, Cassandra L, PA-C  ibuprofen (ADVIL) 800 MG tablet Take 1 tablet (800 mg total) by mouth every 8 (eight) hours as needed for moderate pain. 12/31/20   Chesley Mires, MD  lidocaine-prilocaine (EMLA) cream Apply 1 application topically as needed. 02/26/21   Heilingoetter, Cassandra L, PA-C  MEGARED OMEGA-3 KRILL OIL PO Take 1  capsule by mouth daily.    [provider]  metFORMIN (GLUCOPHAGE) 500 MG tablet Take 1 tablet (500 mg total) by mouth 2 (two) times daily with a meal. 01/25/21   [name], Peter M, MD  metoprolol tartrate (LOPRESSOR) 25 MG tablet Take 1 tablet (25 mg total) by mouth 2 (two) times daily. Hold if heart rate less than 60 or blood pressure top number is less than 95 02/27/21   Sande Rives E, PA-C  mirtazapine (REMERON) 15 MG tablet Take 15 mg by mouth at bedtime. 06/04/20   [provider]  modafinil (PROVIGIL) 100 MG tablet Take 1 tablet (100 mg total) by mouth daily. 01/10/21   Chesley Mires, MD  Multiple Vitamins-Minerals (MULTIVITAMIN WITH MINERALS) tablet Take 1 tablet by mouth daily. Centrum    [provider]  nitroGLYCERIN (NITROSTAT) 0.4 MG SL tablet Place 1 tablet (0.4 mg total) under the tongue every 5 (five) minutes as needed for chest pain. 10/24/20 03/14/21  [name],  Marcina Millard, MD  omeprazole (PRILOSEC OTC) 20 MG tablet Take 20 mg by mouth every evening.    [provider]  OXYGEN Inhale 4-6 L into the lungs continuous.    [provider]  potassium chloride SA (KLOR-CON M) 20 MEQ tablet Take 1 tablet (20 mEq total) by mouth daily. 03/12/21   Heilingoetter, Cassandra L, PA-C  prochlorperazine (COMPAZINE) 10 MG tablet Take 1 tablet (10 mg total) by mouth every 6 (six) hours as needed for nausea or vomiting. 02/01/21   Curt Bears, MD  revefenacin [facility]) 175 MCG/3ML nebulizer solution Take 3 mLs (175 mcg total) by nebulization daily. 09/14/20   Chesley Mires, MD  sertraline (ZOLOFT) 100 MG tablet Take 150 mg by mouth daily. 06/07/12   [provider]  traMADol (ULTRAM) 50 MG tablet Take 1 tablet (50 mg total) by mouth every 6 (six) hours as needed. Patient taking differently: Take 50 mg by mouth every 6 (six) hours as needed for moderate pain. 02/01/21   Curt Bears, MD     Vital Signs: BP (!) 120/58    Pulse (!) 50    Temp (!) 97.5  F (36.4 C) (Oral)    Resp 20    SpO2 100%   Physical Exam awake, alert.  Chest with clear but distant breath sounds bilaterally.  Heart with bradycardic but regular rhythm.  Abdomen soft, positive bowel sounds, nontender.  No lower extremity edema.  Imaging: No results found.  Labs:  CBC: Recent Labs    02/18/21 1906 02/26/21 1203 03/05/21 1249 03/12/21 0749  WBC 14.3* 11.4* 5.1 3.4*  HGB 9.1* 8.7* 7.7* 7.9*  HCT 33.8* 30.7* 27.8* 27.5*  PLT 469* 340 228 286    COAGS: Recent Labs    02/12/21 1059  INR 1.1  APTT 30    BMP: Recent Labs    02/18/21 1906 02/26/21 1203 03/05/21 1249 03/12/21 0749  NA 137 140 142 143  K 3.8 3.7 3.9 3.2*  CL 102 102 103 104  CO2 27 30 33* 32  GLUCOSE 89 172* 113* 114*  BUN 27* 16 16 14   CALCIUM 9.0 9.3 9.5 9.4  CREATININE 0.90 0.80 0.86 0.91  GFRNONAA >60 >60 >60 >60    LIVER FUNCTION TESTS: Recent Labs    02/18/21 1906 02/26/21 1203 03/05/21 1249 03/12/21 0749  BILITOT 0.7 0.4 0.3 0.3  AST 10* 7* 8* 9*  ALT <5 <5 <5 <5  ALKPHOS 69 72 72 74  PROT 6.2* 6.3* 6.4* 6.2*  ALBUMIN 2.9* 3.2* 3.4* 3.4*    Assessment and Plan: Patient familiar to IR service from left thoracentesis x2 in 2022. He is a remote smoker and has a history of stage IIIA/IV (T3, N1, M0/M1a) non-small cell lung cancer, squamous cell carcinoma and presented with central area obstructive left lung mass with  left hilar adenopathy and suspicious but not confirmed malignant pleural effusion and right hip lesion.  This was diagnosed in December 2022.  Past medical history also significant for blindness in the left eye, COPD, depression, diabetes, diverticulosis, glaucoma, hypertension, hyperlipidemia, anemia, arthritis, and Parkinson's disease.  He presents today for Port-A-Cath placement to assist with treatment.Risks and benefits of image guided port-a-catheter placement was discussed with the patient including, but not limited to bleeding, infection,  pneumothorax, or fibrin sheath development and need for additional procedures.  All of the patient's questions were answered, patient is agreeable to proceed. Consent signed and in chart.    Electronically Signed: D. Rowe Robert, PA-C 03/18/2021,  12:01 PM   I spent a total of 25 Minutes at the the patient's bedside AND on the patient's hospital floor or unit, greater than 50% of which was counseling/coordinating care for Port-A-Cath placement

## 2021-03-18 NOTE — Patient Instructions (Signed)

## 2021-03-18 NOTE — Discharge Instructions (Signed)
Interventional radiology phone numbers °[phone] °After hours [phone] ° ° ° °You have skin glue (dermabond) over your new port. Do not use the lidocaine cream (EMLA cream) over the skin glue until it has healed. The petroleum in the lidocaine cream will dissolve the skin glue resulting in an infection of your new port. Use ice in a zip lock bag for 1-2 minutes over your new port before the cancer center nurses access your port. ° ° °Implanted Port Insertion, Care After °This sheet gives you information about how to care for yourself after your procedure. Your health care provider may also give you more specific instructions. If you have problems or questions, contact your health care provider. °What can I expect after the procedure? °After the procedure, it is common to have: °Discomfort at the port insertion site. °Bruising on the skin over the port. This should improve over 3-4 days. °Follow these instructions at home: °Port care °After your port is placed, you will get a manufacturer's information card. The card has information about your port. Keep this card with you at all times. °Take care of the port as told by your health care provider. Ask your health care provider if you or a family member can get training for taking care of the port at home. A home health care nurse may also take care of the port. °Make sure to remember what type of port you have. °Incision care °Follow instructions from your health care provider about how to take care of your port insertion site. Make sure you: °Wash your hands with soap and water before and after you change your bandage (dressing). If soap and water are not available, use hand sanitizer. °Change your dressing as told by your health care provider. °Leave skin glue in place. These skin closures may need to stay in place for 2 weeks or longer.  °Check your port insertion site every day for signs of infection. Check for: °Redness, swelling, or pain. °Fluid or  blood. °Warmth. °Pus or a bad smell.  °  °  °Activity °Return to your normal activities as told by your health care provider. Ask your health care provider what activities are safe for you. °Do not lift anything that is heavier than 10 lb (4.5 kg), or the limit that you are told, until your health care provider says that it is safe. °General instructions °Take over-the-counter and prescription medicines only as told by your health care provider. °Do not take baths, swim, or use a hot tub until your health care provider approves.You may remove your dressing tomorrow and shower 24 hours after your procedure. °Do not drive for 24 hours if you were given a sedative during your procedure. °Wear a medical alert bracelet in case of an emergency. This will tell any health care providers that you have a port. °Keep all follow-up visits as told by your health care provider. This is important. °Contact a health care provider if: °You cannot flush your port with saline as directed, or you cannot draw blood from the port. °You have a fever or chills. °You have redness, swelling, or pain around your port insertion site. °You have fluid or blood coming from your port insertion site. °Your port insertion site feels warm to the touch. °You have pus or a bad smell coming from the port insertion site. °Get help right away if: °You have chest pain or shortness of breath. °You have bleeding from your port that you cannot control. °Summary °Take care of   the port as told by your health care provider. Keep the manufacturer's information card with you at all times. °Change your dressing as told by your health care provider. °Contact a health care provider if you have a fever or chills or if you have redness, swelling, or pain around your port insertion site. °Keep all follow-up visits as told by your health care provider. °This information is not intended to replace advice given to you by your health care provider. Make sure you discuss any  questions you have with your health care provider. °Document Revised: 09/08/2017 Document Reviewed: 09/08/2017 °Elsevier Patient Education © 2021 [employer] ° ° ° °Moderate Conscious Sedation, Adult, Care After °This sheet gives you information about how to care for yourself after your procedure. Your health care provider may also give you more specific instructions. If you have problems or questions, contact your health care provider. °What can I expect after the procedure? °After the procedure, it is common to have: °Sleepiness for several hours. °Impaired judgment for several hours. °Difficulty with balance. °Vomiting if you eat too soon. °Follow these instructions at home: °For the time period you were told by your health care provider: °Rest. °Do not participate in activities where you could fall or become injured. °Do not drive or use machinery. °Do not drink alcohol. °Do not take sleeping pills or medicines that cause drowsiness. °Do not make important decisions or sign legal documents. °Do not take care of children on your own.  °  °  °Eating and drinking °Follow the diet recommended by your health care provider. °Drink enough fluid to keep your urine pale yellow. °If you vomit: °Drink water, juice, or soup when you can drink without vomiting. °Make sure you have little or no nausea before eating solid foods.   °General instructions °Take over-the-counter and prescription medicines only as told by your health care provider. °Have a responsible adult stay with you for the time you are told. It is important to have someone help care for you until you are awake and alert. °Do not smoke. °Keep all follow-up visits as told by your health care provider. This is important. °Contact a health care provider if: °You are still sleepy or having trouble with balance after 24 hours. °You feel light-headed. °You keep feeling nauseous or you keep vomiting. °You develop a rash. °You have a fever. °You have redness or  swelling around the IV site. °Get help right away if: °You have trouble breathing. °You have new-onset confusion at home. °Summary °After the procedure, it is common to feel sleepy, have impaired judgment, or feel nauseous if you eat too soon. °Rest after you get home. Know the things you should not do after the procedure. °Follow the diet recommended by your health care provider and drink enough fluid to keep your urine pale yellow. °Get help right away if you have trouble breathing or new-onset confusion at home. °This information is not intended to replace advice given to you by your health care provider. Make sure you discuss any questions you have with your health care provider. °Document Revised: 06/10/2019 Document Reviewed: 01/06/2019 °Elsevier Patient Education © 2021 Elsevier Inc.  °

## 2021-03-18 NOTE — Procedures (Signed)
Interventional Radiology Procedure Note  Procedure: Placement of a right IJ approach single lumen PowerPort.  Tip is positioned at the superior cavoatrial junction and catheter is ready for immediate use.  Complications: None Recommendations:  - Ok to shower tomorrow - Do not submerge for 7 days - Routine line care   Signed,  Carmine Youngberg S. Makyle Eslick, DO   

## 2021-03-19 ENCOUNTER — Ambulatory Visit: Payer: Medicare Other | Admitting: Internal Medicine

## 2021-03-19 ENCOUNTER — Other Ambulatory Visit: Payer: Medicare Other

## 2021-03-19 ENCOUNTER — Other Ambulatory Visit: Payer: Self-pay | Admitting: Physician Assistant

## 2021-03-19 ENCOUNTER — Inpatient Hospital Stay: Payer: Medicare Other

## 2021-03-19 ENCOUNTER — Ambulatory Visit
Admission: RE | Admit: 2021-03-19 | Discharge: 2021-03-19 | Disposition: A | Discharge status: Home / Self Care | Payer: Medicare Other | Source: Ambulatory Visit | Attending: Radiation Oncology | Admitting: Radiation Oncology

## 2021-03-19 VITALS — BP 126/58 | HR 63 | Temp 97.7°F | Resp 20 | Wt 196.5 lb

## 2021-03-19 DIAGNOSIS — Z87891 Personal history of nicotine dependence: Secondary | ICD-10-CM | POA: Diagnosis not present

## 2021-03-19 DIAGNOSIS — D509 Iron deficiency anemia, unspecified: Secondary | ICD-10-CM

## 2021-03-19 DIAGNOSIS — C3432 Malignant neoplasm of lower lobe, left bronchus or lung: Secondary | ICD-10-CM

## 2021-03-19 DIAGNOSIS — Z95828 Presence of other vascular implants and grafts: Secondary | ICD-10-CM

## 2021-03-19 DIAGNOSIS — Z510 Encounter for antineoplastic radiation therapy: Secondary | ICD-10-CM | POA: Diagnosis not present

## 2021-03-19 DIAGNOSIS — E876 Hypokalemia: Secondary | ICD-10-CM

## 2021-03-19 LAB — CBC WITH DIFFERENTIAL (CANCER CENTER ONLY)
Abs Immature Granulocytes: 0.02 10*3/uL (ref 0.00–0.07)
Basophils Absolute: 0 10*3/uL (ref 0.0–0.1)
Basophils Relative: 1 %
Eosinophils Absolute: 0 10*3/uL (ref 0.0–0.5)
Eosinophils Relative: 0 %
HCT: 32.1 % — ABNORMAL LOW (ref 39.0–52.0)
Hemoglobin: 9.7 g/dL — ABNORMAL LOW (ref 13.0–17.0)
Immature Granulocytes: 1 %
Lymphocytes Relative: 9 %
Lymphs Abs: 0.3 10*3/uL — ABNORMAL LOW (ref 0.7–4.0)
MCH: 23.6 pg — ABNORMAL LOW (ref 26.0–34.0)
MCHC: 30.2 g/dL (ref 30.0–36.0)
MCV: 78.1 fL — ABNORMAL LOW (ref 80.0–100.0)
Monocytes Absolute: 0.3 10*3/uL (ref 0.1–1.0)
Monocytes Relative: 11 %
Neutro Abs: 2.4 10*3/uL (ref 1.7–7.7)
Neutrophils Relative %: 78 %
Platelet Count: 270 10*3/uL (ref 150–400)
RBC: 4.11 MIL/uL — ABNORMAL LOW (ref 4.22–5.81)
RDW: 27.1 % — ABNORMAL HIGH (ref 11.5–15.5)
WBC Count: 3 10*3/uL — ABNORMAL LOW (ref 4.0–10.5)
nRBC: 0 % (ref 0.0–0.2)

## 2021-03-19 LAB — CMP (CANCER CENTER ONLY)
ALT: <5 U/L (ref 0–44)
AST: 9 U/L — ABNORMAL LOW (ref 15–41)
Albumin: 3.3 g/dL — ABNORMAL LOW (ref 3.5–5.0)
Alkaline Phosphatase: 73 U/L (ref 38–126)
Anion gap: 6 (ref 5–15)
BUN: 13 mg/dL (ref 8–23)
CO2: 30 mmol/L (ref 22–32)
Calcium: 9.1 mg/dL (ref 8.9–10.3)
Chloride: 107 mmol/L (ref 98–111)
Creatinine: 0.89 mg/dL (ref 0.61–1.24)
GFR, Estimated: >60 mL/min (ref >60)
Glucose, Bld: 146 mg/dL — ABNORMAL HIGH (ref 70–99)
Potassium: 3.1 mmol/L — ABNORMAL LOW (ref 3.5–5.1)
Sodium: 143 mmol/L (ref 135–145)
Total Bilirubin: 0.4 mg/dL (ref 0.3–1.2)
Total Protein: 6.1 g/dL — ABNORMAL LOW (ref 6.5–8.1)

## 2021-03-19 LAB — SAMPLE TO BLOOD BANK

## 2021-03-19 MED ORDER — POTASSIUM CHLORIDE CRYS ER 20 MEQ PO TBCR: 20.0000 meq | EXTENDED_RELEASE_TABLET | Freq: Every day | ORAL | 0 refills | Status: DC

## 2021-03-19 MED ORDER — FAMOTIDINE 20 MG IN NS 100 ML IVPB
20.0000 mg | Freq: Once | INTRAVENOUS | Status: AC
  Administered 2021-03-19: 13:00:00 20 mg via INTRAVENOUS
  Filled 2021-03-19: qty 100

## 2021-03-19 MED ORDER — SODIUM CHLORIDE 0.9 % IV SOLN
10.0000 mg | Freq: Once | INTRAVENOUS | Status: AC
  Administered 2021-03-19: 13:00:00 10 mg via INTRAVENOUS
  Filled 2021-03-19: qty 10

## 2021-03-19 MED ORDER — PALONOSETRON HCL INJECTION 0.25 MG/5ML
0.2500 mg | Freq: Once | INTRAVENOUS | Status: AC
  Administered 2021-03-19: 12:00:00 0.25 mg via INTRAVENOUS
  Filled 2021-03-19: qty 5

## 2021-03-19 MED ORDER — SODIUM CHLORIDE 0.9% FLUSH
10.0000 mL | INTRAVENOUS | Status: DC | PRN
  Administered 2021-03-19: 16:00:00 10 mL

## 2021-03-19 MED ORDER — SODIUM CHLORIDE 0.9 % IV SOLN
223.6000 mg | Freq: Once | INTRAVENOUS | Status: AC
  Administered 2021-03-19: 15:00:00 220 mg via INTRAVENOUS
  Filled 2021-03-19: qty 22

## 2021-03-19 MED ORDER — HEPARIN SOD (PORK) LOCK FLUSH 100 UNIT/ML IV SOLN
500.0000 [IU] | Freq: Once | INTRAVENOUS | Status: AC | PRN
  Administered 2021-03-19: 16:00:00 500 [IU]

## 2021-03-19 MED ORDER — SODIUM CHLORIDE 0.9 % IV SOLN
45.0000 mg/m2 | Freq: Once | INTRAVENOUS | Status: AC
  Administered 2021-03-19: 14:00:00 102 mg via INTRAVENOUS
  Filled 2021-03-19: qty 17

## 2021-03-19 MED ORDER — DIPHENHYDRAMINE HCL 50 MG/ML IJ SOLN
50.0000 mg | Freq: Once | INTRAMUSCULAR | Status: AC
  Administered 2021-03-19: 12:00:00 50 mg via INTRAVENOUS
  Filled 2021-03-19: qty 1

## 2021-03-19 MED ORDER — SODIUM CHLORIDE 0.9 % IV SOLN: Freq: Once | INTRAVENOUS | Status: AC

## 2021-03-19 MED ORDER — SODIUM CHLORIDE 0.9% FLUSH
10.0000 mL | INTRAVENOUS | Status: AC | PRN
  Administered 2021-03-19: 11:00:00 10 mL

## 2021-03-19 NOTE — Patient Instructions (Signed)
[facility] MEDICAL [facility]COLOGY  Discharge Instructions: Thank you for choosing [facility] to provide your oncology and hematology care.   If you have a lab appointment with the Cancer Center, please go directly to the Cancer Center and check in at the registration area.   Wear comfortable clothing and clothing appropriate for easy access to any Portacath or PICC line.   We strive to give you quality time with your provider. You may need to reschedule your appointment if you arrive late (15 or more minutes).  Arriving late affects you and other patients whose appointments are after yours.  Also, if you miss three or more appointments without notifying the office, you may be dismissed from the clinic at the provider's discretion.      For prescription refill requests, have your pharmacy contact our office and allow 72 hours for refills to be completed.    Today you received the following chemotherapy and/or immunotherapy agents : Paclitaxel,  Carboplatin.   To help prevent nausea and vomiting after your treatment, we encourage you to take your nausea medication as directed.  BELOW ARE SYMPTOMS THAT SHOULD BE REPORTED IMMEDIATELY: *FEVER GREATER THAN 100.4 F (38 C) OR HIGHER *CHILLS OR SWEATING *NAUSEA AND VOMITING THAT IS NOT CONTROLLED WITH YOUR NAUSEA MEDICATION *UNUSUAL SHORTNESS OF BREATH *UNUSUAL BRUISING OR BLEEDING *URINARY PROBLEMS (pain or burning when urinating, or frequent urination) *BOWEL PROBLEMS (unusual diarrhea, constipation, pain near the anus) TENDERNESS IN MOUTH AND THROAT WITH OR WITHOUT PRESENCE OF ULCERS (sore throat, sores in mouth, or a toothache) UNUSUAL RASH, SWELLING OR PAIN  UNUSUAL VAGINAL DISCHARGE OR ITCHING   Items with * indicate a potential emergency and should be followed up as soon as possible or go to the Emergency Department if any problems should occur.  Please show the CHEMOTHERAPY ALERT CARD or IMMUNOTHERAPY ALERT CARD  at check-in to the Emergency Department and triage nurse.  Should you have questions after your visit or need to cancel or reschedule your appointment, please contact [facility]  Dept: [phone]  and follow the prompts.  Office hours are 8:00 a.m. to 4:30 p.m. Monday - Friday. Please note that voicemails left after 4:00 p.m. may not be returned until the following business day.  We are closed weekends and major holidays. You have access to a nurse at all times for urgent questions. Please call the main number to the clinic Dept: [phone] and follow the prompts.   For any non-urgent questions, you may also contact your provider using MyChart. We now offer e-Visits for anyone 18 and older to request care online for non-urgent symptoms. For details visit [email or web address].   Also download the MyChart app! Go to the app store, search "MyChart", open the app, select , and log in with your MyChart username and password.  Due to Covid, a mask is required upon entering the hospital/clinic. If you do not have a mask, one will be given to you upon arrival. For doctor visits, patients may have 1 support person aged 18 or older with them. For treatment visits, patients cannot have anyone with them due to current Covid guidelines and our immunocompromised population.   

## 2021-03-20 ENCOUNTER — Ambulatory Visit
Admission: RE | Admit: 2021-03-20 | Discharge: 2021-03-20 | Disposition: A | Discharge status: Home / Self Care | Payer: Medicare Other | Source: Ambulatory Visit | Attending: Radiation Oncology | Admitting: Radiation Oncology

## 2021-03-20 ENCOUNTER — Other Ambulatory Visit: Payer: Self-pay

## 2021-03-20 DIAGNOSIS — M1990 Unspecified osteoarthritis, unspecified site: Secondary | ICD-10-CM | POA: Diagnosis not present

## 2021-03-20 DIAGNOSIS — D509 Iron deficiency anemia, unspecified: Secondary | ICD-10-CM | POA: Diagnosis not present

## 2021-03-20 DIAGNOSIS — I10 Essential (primary) hypertension: Secondary | ICD-10-CM | POA: Diagnosis not present

## 2021-03-20 DIAGNOSIS — C3432 Malignant neoplasm of lower lobe, left bronchus or lung: Secondary | ICD-10-CM | POA: Diagnosis not present

## 2021-03-20 DIAGNOSIS — H5462 Unqualified visual loss, left eye, normal vision right eye: Secondary | ICD-10-CM | POA: Diagnosis not present

## 2021-03-20 DIAGNOSIS — R296 Repeated falls: Secondary | ICD-10-CM | POA: Diagnosis not present

## 2021-03-20 DIAGNOSIS — Z7901 Long term (current) use of anticoagulants: Secondary | ICD-10-CM | POA: Diagnosis not present

## 2021-03-20 DIAGNOSIS — G4700 Insomnia, unspecified: Secondary | ICD-10-CM | POA: Diagnosis not present

## 2021-03-20 DIAGNOSIS — J9621 Acute and chronic respiratory failure with hypoxia: Secondary | ICD-10-CM | POA: Diagnosis not present

## 2021-03-20 DIAGNOSIS — Z7951 Long term (current) use of inhaled steroids: Secondary | ICD-10-CM | POA: Diagnosis not present

## 2021-03-20 DIAGNOSIS — F32A Depression, unspecified: Secondary | ICD-10-CM | POA: Diagnosis not present

## 2021-03-20 DIAGNOSIS — U071 COVID-19: Secondary | ICD-10-CM | POA: Diagnosis not present

## 2021-03-20 DIAGNOSIS — Z87891 Personal history of nicotine dependence: Secondary | ICD-10-CM | POA: Diagnosis not present

## 2021-03-20 DIAGNOSIS — E441 Mild protein-calorie malnutrition: Secondary | ICD-10-CM | POA: Diagnosis not present

## 2021-03-20 DIAGNOSIS — K5790 Diverticulosis of intestine, part unspecified, without perforation or abscess without bleeding: Secondary | ICD-10-CM | POA: Diagnosis not present

## 2021-03-20 DIAGNOSIS — Z7984 Long term (current) use of oral hypoglycemic drugs: Secondary | ICD-10-CM | POA: Diagnosis not present

## 2021-03-20 DIAGNOSIS — E785 Hyperlipidemia, unspecified: Secondary | ICD-10-CM | POA: Diagnosis not present

## 2021-03-20 DIAGNOSIS — Z510 Encounter for antineoplastic radiation therapy: Secondary | ICD-10-CM | POA: Diagnosis not present

## 2021-03-20 DIAGNOSIS — J439 Emphysema, unspecified: Secondary | ICD-10-CM | POA: Diagnosis not present

## 2021-03-20 DIAGNOSIS — Z7952 Long term (current) use of systemic steroids: Secondary | ICD-10-CM | POA: Diagnosis not present

## 2021-03-20 DIAGNOSIS — H409 Unspecified glaucoma: Secondary | ICD-10-CM | POA: Diagnosis not present

## 2021-03-20 DIAGNOSIS — E041 Nontoxic single thyroid nodule: Secondary | ICD-10-CM | POA: Diagnosis not present

## 2021-03-20 DIAGNOSIS — E119 Type 2 diabetes mellitus without complications: Secondary | ICD-10-CM | POA: Diagnosis not present

## 2021-03-20 DIAGNOSIS — Z9181 History of falling: Secondary | ICD-10-CM | POA: Diagnosis not present

## 2021-03-20 DIAGNOSIS — K648 Other hemorrhoids: Secondary | ICD-10-CM | POA: Diagnosis not present

## 2021-03-20 DIAGNOSIS — G20 Parkinson's disease: Secondary | ICD-10-CM | POA: Diagnosis not present

## 2021-03-20 DIAGNOSIS — I4892 Unspecified atrial flutter: Secondary | ICD-10-CM | POA: Diagnosis not present

## 2021-03-21 ENCOUNTER — Ambulatory Visit
Admission: RE | Admit: 2021-03-21 | Discharge: 2021-03-21 | Disposition: A | Discharge status: Home / Self Care | Payer: Medicare Other | Source: Ambulatory Visit | Attending: Radiation Oncology | Admitting: Radiation Oncology

## 2021-03-21 DIAGNOSIS — C3432 Malignant neoplasm of lower lobe, left bronchus or lung: Secondary | ICD-10-CM | POA: Diagnosis not present

## 2021-03-21 DIAGNOSIS — I4892 Unspecified atrial flutter: Secondary | ICD-10-CM | POA: Diagnosis not present

## 2021-03-21 DIAGNOSIS — G20 Parkinson's disease: Secondary | ICD-10-CM | POA: Diagnosis not present

## 2021-03-21 DIAGNOSIS — Z510 Encounter for antineoplastic radiation therapy: Secondary | ICD-10-CM | POA: Diagnosis not present

## 2021-03-21 DIAGNOSIS — U071 COVID-19: Secondary | ICD-10-CM | POA: Diagnosis not present

## 2021-03-21 DIAGNOSIS — J9621 Acute and chronic respiratory failure with hypoxia: Secondary | ICD-10-CM | POA: Diagnosis not present

## 2021-03-21 DIAGNOSIS — E119 Type 2 diabetes mellitus without complications: Secondary | ICD-10-CM | POA: Diagnosis not present

## 2021-03-21 DIAGNOSIS — Z87891 Personal history of nicotine dependence: Secondary | ICD-10-CM | POA: Diagnosis not present

## 2021-03-22 ENCOUNTER — Ambulatory Visit: Payer: Medicare Other

## 2021-03-22 ENCOUNTER — Ambulatory Visit
Admission: RE | Admit: 2021-03-22 | Discharge: 2021-03-22 | Disposition: A | Discharge status: Home / Self Care | Payer: Medicare Other | Source: Ambulatory Visit | Attending: Radiation Oncology | Admitting: Radiation Oncology

## 2021-03-22 ENCOUNTER — Other Ambulatory Visit: Payer: Self-pay

## 2021-03-22 DIAGNOSIS — E119 Type 2 diabetes mellitus without complications: Secondary | ICD-10-CM | POA: Diagnosis not present

## 2021-03-22 DIAGNOSIS — U071 COVID-19: Secondary | ICD-10-CM | POA: Diagnosis not present

## 2021-03-22 DIAGNOSIS — C3432 Malignant neoplasm of lower lobe, left bronchus or lung: Secondary | ICD-10-CM | POA: Diagnosis not present

## 2021-03-22 DIAGNOSIS — Z510 Encounter for antineoplastic radiation therapy: Secondary | ICD-10-CM | POA: Diagnosis not present

## 2021-03-22 DIAGNOSIS — G20 Parkinson's disease: Secondary | ICD-10-CM | POA: Diagnosis not present

## 2021-03-22 DIAGNOSIS — J9621 Acute and chronic respiratory failure with hypoxia: Secondary | ICD-10-CM | POA: Diagnosis not present

## 2021-03-22 DIAGNOSIS — I4892 Unspecified atrial flutter: Secondary | ICD-10-CM | POA: Diagnosis not present

## 2021-03-22 DIAGNOSIS — Z87891 Personal history of nicotine dependence: Secondary | ICD-10-CM | POA: Diagnosis not present

## 2021-03-25 ENCOUNTER — Ambulatory Visit
Admission: RE | Admit: 2021-03-25 | Discharge: 2021-03-25 | Disposition: A | Discharge status: Home / Self Care | Payer: Medicare Other | Source: Ambulatory Visit | Attending: Radiation Oncology | Admitting: Radiation Oncology

## 2021-03-25 ENCOUNTER — Other Ambulatory Visit: Payer: Self-pay

## 2021-03-25 DIAGNOSIS — C3432 Malignant neoplasm of lower lobe, left bronchus or lung: Secondary | ICD-10-CM | POA: Diagnosis not present

## 2021-03-25 DIAGNOSIS — R432 Parageusia: Secondary | ICD-10-CM | POA: Diagnosis not present

## 2021-03-25 DIAGNOSIS — Z87891 Personal history of nicotine dependence: Secondary | ICD-10-CM | POA: Diagnosis not present

## 2021-03-25 DIAGNOSIS — E876 Hypokalemia: Secondary | ICD-10-CM | POA: Diagnosis not present

## 2021-03-25 DIAGNOSIS — Z510 Encounter for antineoplastic radiation therapy: Secondary | ICD-10-CM | POA: Diagnosis not present

## 2021-03-26 ENCOUNTER — Ambulatory Visit: Payer: Medicare Other

## 2021-03-26 ENCOUNTER — Ambulatory Visit
Admission: RE | Admit: 2021-03-26 | Discharge: 2021-03-26 | Disposition: A | Discharge status: Home / Self Care | Payer: Medicare Other | Source: Ambulatory Visit | Attending: Radiation Oncology | Admitting: Radiation Oncology

## 2021-03-26 ENCOUNTER — Other Ambulatory Visit: Payer: Self-pay

## 2021-03-26 DIAGNOSIS — I10 Essential (primary) hypertension: Secondary | ICD-10-CM | POA: Diagnosis not present

## 2021-03-26 DIAGNOSIS — C3432 Malignant neoplasm of lower lobe, left bronchus or lung: Secondary | ICD-10-CM | POA: Diagnosis not present

## 2021-03-26 DIAGNOSIS — F339 Major depressive disorder, recurrent, unspecified: Secondary | ICD-10-CM | POA: Diagnosis not present

## 2021-03-26 DIAGNOSIS — E119 Type 2 diabetes mellitus without complications: Secondary | ICD-10-CM | POA: Diagnosis not present

## 2021-03-26 DIAGNOSIS — Z510 Encounter for antineoplastic radiation therapy: Secondary | ICD-10-CM | POA: Diagnosis not present

## 2021-03-26 DIAGNOSIS — G4700 Insomnia, unspecified: Secondary | ICD-10-CM | POA: Diagnosis not present

## 2021-03-26 DIAGNOSIS — Z87891 Personal history of nicotine dependence: Secondary | ICD-10-CM | POA: Diagnosis not present

## 2021-03-26 MED FILL — Dexamethasone Sodium Phosphate Inj 100 MG/10ML: INTRAMUSCULAR | Qty: 1 | Status: AC

## 2021-03-27 ENCOUNTER — Inpatient Hospital Stay (HOSPITAL_BASED_OUTPATIENT_CLINIC_OR_DEPARTMENT_OTHER): Payer: Medicare Other | Admitting: Internal Medicine

## 2021-03-27 ENCOUNTER — Inpatient Hospital Stay: Payer: Medicare Other | Attending: Hematology and Oncology

## 2021-03-27 ENCOUNTER — Inpatient Hospital Stay: Payer: Medicare Other

## 2021-03-27 ENCOUNTER — Ambulatory Visit
Admission: RE | Admit: 2021-03-27 | Discharge: 2021-03-27 | Disposition: A | Discharge status: Home / Self Care | Payer: Medicare Other | Source: Ambulatory Visit | Attending: Radiation Oncology | Admitting: Radiation Oncology

## 2021-03-27 ENCOUNTER — Encounter: Payer: Self-pay | Admitting: *Deleted

## 2021-03-27 ENCOUNTER — Other Ambulatory Visit: Payer: Medicare Other

## 2021-03-27 ENCOUNTER — Encounter: Payer: Self-pay | Admitting: Internal Medicine

## 2021-03-27 ENCOUNTER — Encounter: Payer: Self-pay | Admitting: Urology

## 2021-03-27 ENCOUNTER — Ambulatory Visit: Payer: Medicare Other

## 2021-03-27 VITALS — BP 100/61 | HR 76 | Temp 97.8°F | Resp 18

## 2021-03-27 VITALS — BP 95/61 | HR 72 | Temp 97.4°F | Resp 20 | Ht 73.0 in | Wt 186.2 lb

## 2021-03-27 DIAGNOSIS — C3432 Malignant neoplasm of lower lobe, left bronchus or lung: Secondary | ICD-10-CM | POA: Diagnosis not present

## 2021-03-27 DIAGNOSIS — C3492 Malignant neoplasm of unspecified part of left bronchus or lung: Secondary | ICD-10-CM | POA: Insufficient documentation

## 2021-03-27 DIAGNOSIS — E860 Dehydration: Secondary | ICD-10-CM

## 2021-03-27 DIAGNOSIS — J90 Pleural effusion, not elsewhere classified: Secondary | ICD-10-CM | POA: Insufficient documentation

## 2021-03-27 DIAGNOSIS — Z9981 Dependence on supplemental oxygen: Secondary | ICD-10-CM | POA: Insufficient documentation

## 2021-03-27 DIAGNOSIS — D509 Iron deficiency anemia, unspecified: Secondary | ICD-10-CM

## 2021-03-27 DIAGNOSIS — R630 Anorexia: Secondary | ICD-10-CM | POA: Insufficient documentation

## 2021-03-27 DIAGNOSIS — I4891 Unspecified atrial fibrillation: Secondary | ICD-10-CM | POA: Insufficient documentation

## 2021-03-27 DIAGNOSIS — C3490 Malignant neoplasm of unspecified part of unspecified bronchus or lung: Secondary | ICD-10-CM

## 2021-03-27 LAB — CBC WITH DIFFERENTIAL (CANCER CENTER ONLY)
Abs Immature Granulocytes: 0.04 10*3/uL (ref 0.00–0.07)
Basophils Absolute: 0 10*3/uL (ref 0.0–0.1)
Basophils Relative: 1 %
Eosinophils Absolute: 0 10*3/uL (ref 0.0–0.5)
Eosinophils Relative: 0 %
HCT: 31.2 % — ABNORMAL LOW (ref 39.0–52.0)
Hemoglobin: 9.8 g/dL — ABNORMAL LOW (ref 13.0–17.0)
Immature Granulocytes: 1 %
Lymphocytes Relative: 6 %
Lymphs Abs: 0.3 10*3/uL — ABNORMAL LOW (ref 0.7–4.0)
MCH: 24.8 pg — ABNORMAL LOW (ref 26.0–34.0)
MCHC: 31.4 g/dL (ref 30.0–36.0)
MCV: 79 fL — ABNORMAL LOW (ref 80.0–100.0)
Monocytes Absolute: 0.7 10*3/uL (ref 0.1–1.0)
Monocytes Relative: 14 %
Neutro Abs: 4.1 10*3/uL (ref 1.7–7.7)
Neutrophils Relative %: 78 %
Platelet Count: 223 10*3/uL (ref 150–400)
RBC: 3.95 MIL/uL — ABNORMAL LOW (ref 4.22–5.81)
RDW: 30.1 % — ABNORMAL HIGH (ref 11.5–15.5)
WBC Count: 5.2 10*3/uL (ref 4.0–10.5)
nRBC: 0 % (ref 0.0–0.2)

## 2021-03-27 LAB — CMP (CANCER CENTER ONLY)
ALT: <5 U/L (ref 0–44)
AST: 11 U/L — ABNORMAL LOW (ref 15–41)
Albumin: 3.1 g/dL — ABNORMAL LOW (ref 3.5–5.0)
Alkaline Phosphatase: 73 U/L (ref 38–126)
Anion gap: 10 (ref 5–15)
BUN: 17 mg/dL (ref 8–23)
CO2: 27 mmol/L (ref 22–32)
Calcium: 9.4 mg/dL (ref 8.9–10.3)
Chloride: 105 mmol/L (ref 98–111)
Creatinine: 0.94 mg/dL (ref 0.61–1.24)
GFR, Estimated: >60 mL/min (ref >60)
Glucose, Bld: 180 mg/dL — ABNORMAL HIGH (ref 70–99)
Potassium: 3.2 mmol/L — ABNORMAL LOW (ref 3.5–5.1)
Sodium: 142 mmol/L (ref 135–145)
Total Bilirubin: 0.4 mg/dL (ref 0.3–1.2)
Total Protein: 6.3 g/dL — ABNORMAL LOW (ref 6.5–8.1)

## 2021-03-27 LAB — SAMPLE TO BLOOD BANK

## 2021-03-27 MED ORDER — HEPARIN SOD (PORK) LOCK FLUSH 100 UNIT/ML IV SOLN
500.0000 [IU] | Freq: Once | INTRAVENOUS | Status: AC | PRN
  Administered 2021-03-27: 500 [IU]

## 2021-03-27 MED ORDER — SODIUM CHLORIDE 0.9% FLUSH
10.0000 mL | Freq: Once | INTRAVENOUS | Status: AC | PRN
  Administered 2021-03-27: 10 mL

## 2021-03-27 MED ORDER — SONAFINE EX EMUL
1.0000 "application " | Freq: Two times a day (BID) | CUTANEOUS | Status: DC
  Administered 2021-03-27: 1 via TOPICAL

## 2021-03-27 MED ORDER — SODIUM CHLORIDE 0.9 % IV SOLN: INTRAVENOUS | Status: AC

## 2021-03-27 NOTE — Progress Notes (Signed)
Patient wife reports rash to his back left side on observation looks like radiation dermatitis.  Requested some cream for the area was given Sonafine at this time wife will use as directed.  Dr. Tammi Klippel was made aware.

## 2021-03-27 NOTE — Progress Notes (Signed)
Oncology Nurse Navigator Documentation  Oncology Nurse Navigator Flowsheets 03/27/2021 02/08/2021 02/01/2021 01/29/2021  Abnormal Finding Date - - - 10/25/2020  Confirmed Diagnosis Date - - - 01/28/2021  Diagnosis Status - - Confirmed Diagnosis Complete Pathology Pending  Planned Course of Treatment Chemotherapy;Radiation - Chemo/Radiation Concurrent -  Phase of Treatment Chemo Radiation Radiation -  Chemotherapy Actual Start Date: - 02/11/2021 - -  Chemotherapy Actual End Date: 03/19/2021 - - -  Radiation Actual Start Date: - 02/04/2021 - -  Radiation Actual End Date: 03/27/2021 - - -  Navigator Follow Up Date: 04/17/2021 02/26/2021 02/05/2021 02/01/2021  Navigator Follow Up Reason: Follow-up Appointment Follow-up Appointment Appointment Review New Patient Appointment  Navigator Location [place]  Referral Date to RadOnc/MedOnc - - - 01/28/2021  Navigator Encounter Type Clinic/MDC Other:;Appt/Treatment Plan Review Initial MedOnc Other:  Treatment Initiated Date - 02/04/2021 - -  Patient Visit Type MedOnc Other Initial;MedOnc Other  Treatment Phase Final Radiation Tx Pre-Tx/Tx Discussion Pre-Tx/Tx Discussion Pre-Tx/Tx Discussion  Barriers/Navigation Needs Education/I spoke to patient and his wife today.  His last XRT tx is today. I help to educate on follow up.  Coordination of Care Coordination of Care;Education Coordination of Care  Education Other - Newly Diagnosed Cancer Education;Other -  Interventions Education;Psycho-Social Support Coordination of Care Coordination of Care;Psycho-Social Support;Education Coordination of Care  Acuity Level 3-Moderate Needs (3-4 Barriers Identified) Level 2-Minimal Needs (1-2 Barriers Identified) Level 4-High Needs (Greater Than 4 Barriers Identified) Level 3-Moderate Needs (3-4 Barriers Identified)  Coordination of Care - Other Appts Other  Education Method Verbal - Verbal;Written -  Time Spent with Patient  [phone]

## 2021-03-27 NOTE — Progress Notes (Signed)
[place] [phone]   Fax:([phone]) [place], [place] [phone]  DIAGNOSIS:  1) Stage IIIA/IV (T3, N1, M0/M1a) non-small cell lung cancer, squamous cell carcinoma presented with central area obstructive left lung mass with probably involvement of left hilar adenopathy and suspicious but not confirmed malignant pleural effusion and right hip lesion.  This was diagnosed in December 2022. 2) history of iron deficiency anemia treated with IV iron infusion in the past.  PD-L1: 15%   No actionable mutations by guardant 360.   PRIOR THERAPY: None   CURRENT THERAPY: Concurrent chemoradiation with weekly carboplatin for AUC of 2 and paclitaxel 45 Mg/M2.  First dose started on 02/27/20.  Status post 4 cycles.  He completed the loss of reduction of radiotherapy on March 27, 2021.  INTERVAL HISTORY: James [name] 76 y.o. male returns to the clinic today for follow-up visit accompanied by his wife.  The patient continues to complain of increasing fatigue and weakness as well as weight loss.  He denied having any significant odynophagia but has epigastric pain.  He had several episodes of diarrhea recently and he was taking Lomotil on as-needed basis.  He denied having any current chest pain but has shortness of breath with exertion with no cough or hemoptysis.  He denied having any fever or chills.  He has no nausea, vomiting, abdominal pain or diarrhea.  He was supposed to start cycle #5 of his treatment today.  MEDICAL HISTORY: Past Medical History:  Diagnosis Date   Allergy    seasonal   Blind left eye    legally   Blood transfusion    2002   COPD (chronic obstructive pulmonary disease) (HCC)    Depression    Diabetes mellitus, type 2 (HCC)    Diverticulosis    Dyspnea    ED (erectile dysfunction)    Glaucoma    Hyperlipidemia    Hypertension    Insomnia    Internal and  external bleeding hemorrhoids    Iron deficiency anemia    Osteoarthritis    Parkinson's disease ([place]) 2014   Dr. [name] PCP  and Dr. [name] at [facility]   Rosacea    Tremor of both hands    Tubular adenoma of colon 03/28/2011    ALLERGIES:  is allergic to aspirin, codeine, and morphine.  MEDICATIONS:  Current Outpatient Medications  Medication Sig Dispense Refill   apixaban (ELIQUIS) 5 MG TABS tablet Take 1 tablet (5 mg total) by mouth 2 (two) times daily. 60 tablet 3   atorvastatin (LIPITOR) 20 MG tablet Take 20 mg by mouth at bedtime.      buPROPion (WELLBUTRIN XL) 300 MG 24 hr tablet Take 150 mg by mouth 2 (two) times daily.     calcium-vitamin D (OSCAL WITH D) 250-125 MG-UNIT per tablet Take 1 tablet by mouth daily.     carbidopa-levodopa (SINEMET IR) 25-100 MG tablet TAKE 1.5 TABLETS BY MOUTH 3 TIMES DAILY. 405 tablet 3   cetirizine (ZYRTEC) 10 MG tablet Take 10 mg by mouth 2 (two) times daily.     diltiazem (CARDIZEM CD) 300 MG 24 hr capsule Take 1 capsule (300 mg total) by mouth daily. 30 capsule 3   donepezil (ARICEPT) 10 MG tablet Take 1 tablet (10 mg total) by mouth at bedtime. 90 tablet 3   ferrous gluconate (FERGON) 324 MG tablet TAKE 1 TABLET BY MOUTH DAILY WITH  BREAKFAST 90 tablet 3   formoterol (PERFOROMIST) 20 MCG/2ML nebulizer solution Take 2 mLs (20 mcg total) by nebulization 2 (two) times daily. 120 mL 5   Green Tea 315 MG CAPS Take 315 mg by mouth daily.     HYDROcodone-acetaminophen (NORCO) 5-325 MG tablet Take 1 tablet by mouth every 6 (six) hours as needed for moderate pain. 30 tablet 0   ibuprofen (ADVIL) 800 MG tablet Take 1 tablet (800 mg total) by mouth every 8 (eight) hours as needed for moderate pain. 30 tablet 1   lidocaine-prilocaine (EMLA) cream Apply 1 application topically as needed. 30 g 2   MEGARED OMEGA-3 KRILL OIL PO Take 1 capsule by mouth daily.     metFORMIN (GLUCOPHAGE) 500 MG tablet Take 1 tablet (500 mg total) by mouth 2 (two) times daily with a  meal.     metoprolol tartrate (LOPRESSOR) 25 MG tablet Take 1 tablet (25 mg total) by mouth 2 (two) times daily. Hold if heart rate less than 60 or blood pressure top number is less than 95 180 tablet 3   mirtazapine (REMERON) 15 MG tablet Take 15 mg by mouth at bedtime.     modafinil (PROVIGIL) 100 MG tablet Take 1 tablet (100 mg total) by mouth daily. 30 tablet 5   Multiple Vitamins-Minerals (MULTIVITAMIN WITH MINERALS) tablet Take 1 tablet by mouth daily. Centrum     nitroGLYCERIN (NITROSTAT) 0.4 MG SL tablet Place 1 tablet (0.4 mg total) under the tongue every 5 (five) minutes as needed for chest pain. 25 tablet 3   omeprazole (PRILOSEC OTC) 20 MG tablet Take 20 mg by mouth every evening.     OXYGEN Inhale 4-6 L into the lungs continuous.     potassium chloride SA (KLOR-CON M) 20 MEQ tablet Take 1 tablet (20 mEq total) by mouth daily. 6 tablet 0   prochlorperazine (COMPAZINE) 10 MG tablet Take 1 tablet (10 mg total) by mouth every 6 (six) hours as needed for nausea or vomiting. 30 tablet 0   revefenacin (YUPELRI) 175 MCG/3ML nebulizer solution Take 3 mLs (175 mcg total) by nebulization daily. 90 mL 5   sertraline (ZOLOFT) 100 MG tablet Take 150 mg by mouth daily.     traMADol (ULTRAM) 50 MG tablet Take 1 tablet (50 mg total) by mouth every 6 (six) hours as needed. (Patient taking differently: Take 50 mg by mouth every 6 (six) hours as needed for moderate pain.) 60 tablet 0   No current facility-administered medications for this visit.    SURGICAL HISTORY:  Past Surgical History:  Procedure Laterality Date   ARTHROSCOPIC REPAIR ACL     BALLOON DILATION  01/28/2021   Procedure: BALLOON DILATION;  Surgeon: [name], DO;  Location: [place];  Service: Pulmonary;;   BRONCHIAL BIOPSY  01/28/2021   Procedure: BRONCHIAL BIOPSIES;  Surgeon: [name], DO;  Location: [place];  Service: Pulmonary;;   CARDIOVERSION N/A 03/04/2021   Procedure: CARDIOVERSION;  Surgeon: [name], MD;  Location: [place];  Service: Cardiovascular;  Laterality: N/A;   [place]   right   COLONOSCOPY WITH PROPOFOL N/A 07/25/2015   Procedure: COLONOSCOPY WITH PROPOFOL;  Surgeon: [name], MD;  Location: [facility];  Service: Endoscopy;  Laterality: N/A;   CRYOTHERAPY  01/28/2021   Procedure: CRYOTHERAPY;  Surgeon: [name], DO;  Location: [facility];  Service: Pulmonary;;   ESOPHAGOGASTRODUODENOSCOPY (EGD) WITH PROPOFOL N/A 07/25/2015  Procedure: ESOPHAGOGASTRODUODENOSCOPY (EGD) WITH PROPOFOL;  Surgeon: [name], MD;  Location: [facility];  Service: Endoscopy;  Laterality: N/A;   HEMOSTASIS CONTROL  01/28/2021   Procedure: HEMOSTASIS CONTROL;  Surgeon: [name], DO;  Location: [place];  Service: Pulmonary;;   INTRAVASCULAR PRESSURE WIRE/FFR STUDY N/A 01/22/2021   Procedure: INTRAVASCULAR PRESSURE WIRE/FFR STUDY;  Surgeon: [name], Peter M, MD;  Location: [facility];  Service: Cardiovascular;  Laterality: N/A;   IR IMAGING GUIDED PORT INSERTION  03/18/2021   IR THORACENTESIS ASP PLEURAL SPACE W/IMG GUIDE  09/17/2020   IR THORACENTESIS ASP PLEURAL SPACE W/IMG GUIDE  10/25/2020   KNEE ARTHROSCOPY  1996   LEFT HEART CATH AND CORONARY ANGIOGRAPHY N/A 01/22/2021   Procedure: LEFT HEART CATH AND CORONARY ANGIOGRAPHY;  Surgeon: [name], Peter M, MD;  Location: [place] [facility];  Service: Cardiovascular;  Laterality: N/A;   PARTIAL COLECTOMY  2008   TEE WITHOUT CARDIOVERSION N/A 03/04/2021   Procedure: TRANSESOPHAGEAL ECHOCARDIOGRAM (TEE);  Surgeon: [name], MD;  Location: [facility];  Service: Cardiovascular;  Laterality: N/A;   THYROID CYST EXCISION     TONSILLECTOMY     VIDEO BRONCHOSCOPY Left 01/28/2021   Procedure: VIDEO BRONCHOSCOPY WITHOUT FLUORO;  Surgeon: [name], DO;  Location: [place];  Service: Pulmonary;  Laterality: Left;    REVIEW OF SYSTEMS:  A  comprehensive review of systems was negative except for: Constitutional: positive for anorexia, fatigue, and weight loss Respiratory: positive for dyspnea on exertion Gastrointestinal: positive for diarrhea Musculoskeletal: positive for muscle weakness   PHYSICAL EXAMINATION: General appearance: alert, cooperative, fatigued, and no distress Head: Normocephalic, without obvious abnormality, atraumatic Neck: no adenopathy, no JVD, supple, symmetrical, trachea midline, and thyroid not enlarged, symmetric, no tenderness/mass/nodules Lymph nodes: Cervical, supraclavicular, and axillary nodes normal. Resp: clear to auscultation bilaterally Back: symmetric, no curvature. ROM normal. No CVA tenderness. Cardio: regular rate and rhythm, S1, S2 normal, no murmur, click, rub or gallop GI: soft, non-tender; bowel sounds normal; no masses,  no organomegaly Extremities: extremities normal, atraumatic, no cyanosis or edema  ECOG PERFORMANCE STATUS: 1 - Symptomatic but completely ambulatory  Blood pressure 95/61, pulse 72, temperature (!) 97.4 F (36.3 C), temperature source Tympanic, resp. rate 20, height $RemoveBe'6\' 1"'MQoyHmTek$  (1.854 m), weight 186 lb 3.2 oz (84.5 kg), SpO2 (!) 89 %.  LABORATORY DATA: Lab Results  Component Value Date   WBC 3.0 (L) 03/19/2021   HGB 9.7 (L) 03/19/2021   HCT 32.1 (L) 03/19/2021   MCV 78.1 (L) 03/19/2021   PLT 270 03/19/2021      Chemistry      Component Value Date/Time   NA 143 03/19/2021 1120   NA 149 (H) 01/15/2021 1414   K 3.1 (L) 03/19/2021 1120   CL 107 03/19/2021 1120   CO2 30 03/19/2021 1120   BUN 13 03/19/2021 1120   BUN 12 01/15/2021 1414   CREATININE 0.89 03/19/2021 1120      Component Value Date/Time   CALCIUM 9.1 03/19/2021 1120   ALKPHOS 73 03/19/2021 1120   AST 9 (L) 03/19/2021 1120   ALT <5 03/19/2021 1120   BILITOT 0.4 03/19/2021 1120       RADIOGRAPHIC STUDIES: ECHO TEE  Result Date: 03/04/2021    TRANSESOPHOGEAL ECHO REPORT   Patient Name:    James [name] Date of Exam: 03/04/2021 Medical Rec #:  [id number]         Height:       73.0 in Accession #:  [id number]        Weight:       199.1 lb Date of Birth:  1945/10/11          BSA:          2.147 m Patient Age:    77 years          BP:           95/74 mmHg Patient Gender: M                 HR:           77 bpm. Exam Location:  Inpatient Procedure: Transesophageal Echo, Cardiac Doppler and Color Doppler Indications:     R94.31 Abnormal EKG  History:         Patient has prior history of Echocardiogram examinations, most                  recent 02/12/2021. Abnormal ECG, COPD, Aortic Valve Disease,                  Arrythmias:Tachycardia; Risk Factors:Hypertension, Diabetes and                  Dyslipidemia.  Sonographer:     Roseanna Rainbow RDCS Referring Phys:  [id number] Greer Ee [place] Diagnosing Phys: Gwyndolyn Kaufman MD PROCEDURE: After discussion of the risks and benefits of a TEE, an informed consent was obtained from the patient. The transesophogeal probe was passed without difficulty through the esophogus of the patient. Imaged were obtained with the patient in a left lateral decubitus position. Sedation performed by different physician. The patient was monitored while under deep sedation. Anesthestetic sedation was provided intravenously by Anesthesiology: 186mg  of Propofol, 100mg  of Lidocaine. The patient developed no complications during the procedure. IMPRESSIONS  1. Left ventricular ejection fraction, by estimation, is 55 to 60%. The left ventricle has normal function.  2. Right ventricular systolic function is normal. The right ventricular size is mildly-to-moderately enlarged.  3. Left atrial size was moderately dilated. No left atrial/left atrial appendage thrombus was detected.  4. Right atrial size was severely dilated.  5. The mitral valve is normal in structure. Mild mitral valve regurgitation.  6. The aortic valve is tricuspid. There is mild calcification of the aortic valve. There is  mild thickening of the aortic valve. Aortic valve regurgitation is mild. Aortic valve sclerosis/calcification is present, without any evidence of aortic stenosis.  7. There is mild (Grade II) plaque.  8. Following TEE, the patient underwent successful DCCV with return to NSR. FINDINGS  Left Ventricle: Left ventricular ejection fraction, by estimation, is 55 to 60%. The left ventricle has normal function. The left ventricular internal cavity size was normal in size. Right Ventricle: The right ventricular size is mildly-to-moderately enlarged. No increase in right ventricular wall thickness. Right ventricular systolic function is normal. Left Atrium: Left atrial size was moderately dilated. No left atrial/left atrial appendage thrombus was detected. Right Atrium: Right atrial size was severely dilated. Pericardium: There is no evidence of pericardial effusion. Mitral Valve: The mitral valve is normal in structure. There is mild thickening of the mitral valve leaflet(s). There is mild calcification of the mitral valve leaflet(s). Mild mitral valve regurgitation. Tricuspid Valve: The tricuspid valve is normal in structure. Tricuspid valve regurgitation is mild. Aortic Valve: The aortic valve is tricuspid. There is mild calcification of the aortic valve. There is mild thickening of the aortic valve. Aortic valve regurgitation is mild. Aortic valve sclerosis/calcification is present,  without any evidence of aortic stenosis. Pulmonic Valve: The pulmonic valve was normal in structure. Pulmonic valve regurgitation is trivial. Aorta: The aortic root is normal in size and structure. There is mild (Grade II) plaque. IAS/Shunts: No atrial level shunt detected by color flow Doppler.  TRICUSPID VALVE TR Peak grad:   14.7 mmHg TR Vmax:        192.00 cm/s Gwyndolyn Kaufman MD Electronically signed by Gwyndolyn Kaufman MD Signature Date/Time: 03/04/2021/4:53:54 PM    Final    IR IMAGING GUIDED PORT INSERTION  Result Date:  03/18/2021 INDICATION: 76 year old male referred for port catheter EXAM: IMAGE GUIDED PORT CATHETER MEDICATIONS: None ANESTHESIA/SEDATION: Moderate (conscious) sedation was employed during this procedure. A total of Versed 1.0 mg and Fentanyl 50 mcg was administered intravenously. Moderate Sedation Time: 20 minutes. The patient's level of consciousness and vital signs were monitored continuously by radiology nursing throughout the procedure under my direct supervision. FLUOROSCOPY TIME:  Fluoroscopy Time: 0 minutes 18 seconds (2 mGy). COMPLICATIONS: None PROCEDURE: The procedure, risks, benefits, and alternatives were explained to the patient. Questions regarding the procedure were encouraged and answered. The patient understands and consents to the procedure. Ultrasound survey was performed with images stored and sent to PACs. Right IJ vein documented to be patent. The right neck and chest was prepped with chlorhexidine, and draped in the usual sterile fashion using maximum barrier technique (cap and mask, sterile gown, sterile gloves, large sterile sheet, hand hygiene and cutaneous antiseptic). Local anesthesia was attained by infiltration with 1% lidocaine without epinephrine. Ultrasound demonstrated patency of the right internal jugular vein, and this was documented with an image. Under real-time ultrasound guidance, this vein was accessed with a 21 gauge micropuncture needle and image documentation was performed. A small dermatotomy was made at the access site with an 11 scalpel. A 0.018" wire was advanced into the SVC and used to estimate the length of the internal catheter. The access needle exchanged for a 49F micropuncture vascular sheath. The 0.018" wire was then removed and a 0.035" wire advanced into the IVC. An appropriate location for the subcutaneous reservoir was selected below the clavicle and an incision was made through the skin and underlying soft tissues. The subcutaneous tissues were then  dissected using a combination of blunt and sharp surgical technique and a pocket was formed. A single lumen power injectable portacatheter was then tunneled through the subcutaneous tissues from the pocket to the dermatotomy and the port reservoir placed within the subcutaneous pocket. The venous access site was then serially dilated and a peel away vascular sheath placed over the wire. The wire was removed and the port catheter advanced into position under fluoroscopic guidance. The catheter tip is positioned in the cavoatrial junction. This was documented with a spot image. The portacatheter was then tested and found to flush and aspirate well. The port was flushed with saline followed by 100 units/mL heparinized saline. The pocket was then closed in two layers using first subdermal inverted interrupted absorbable sutures followed by a running subcuticular suture. The epidermis was then sealed with Dermabond. The dermatotomy at the venous access site was also seal with Dermabond. Patient tolerated the procedure well and remained hemodynamically stable throughout. No complications encountered and no significant blood loss encountered IMPRESSION: Status post right IJ port catheter Signed, Dulcy Fanny. Dellia Nims, RPVI [facility] Radiology Electronically Signed   By: Corrie Mckusick D.O.   On: 03/18/2021 16:18    ASSESSMENT AND PLAN: This is  a very pleasant 76 years old white male diagnosed with Stage IIIA/IV (T3, N1, M0/M1a) non-small cell lung cancer, squamous cell carcinoma presented with central area obstructive left lung mass with probably involvement of left hilar adenopathy and suspicious but not confirmed malignant pleural effusion and right hip lesion.  This was diagnosed in December 2022. The patient also has a history of iron deficiency anemia and was on treatment with iron infusion. He is currently undergoing a course of concurrent chemoradiation with weekly  carboplatin for AUC of 2 and paclitaxel 45 Mg/M2 status post 4 cycles.  T The patient completed the last fraction of radiotherapy today. I recommended for him to cancel the last few cycles of his chemotherapy. I will see him back for follow-up visit in 3 weeks for evaluation with repeat CT scan of the chest for restaging of his disease. For the dehydration and lack of appetite, I will arrange for the patient to receive 1 L of normal saline today. He was advised to call immediately if he has any other concerning symptoms in the interval. The patient voices understanding of current disease status and treatment options and is in agreement with the current care plan.  All questions were answered. The patient knows to call the clinic with any problems, questions or concerns. We can certainly see the patient much sooner if necessary.  Disclaimer: This note was dictated with voice recognition software. Similar sounding words can inadvertently be transcribed and may not be corrected upon review.

## 2021-03-27 NOTE — Patient Instructions (Signed)
Dehydration, Adult Dehydration is a condition in which there is not enough water or other fluids in the body. This happens when a person loses more fluids than he or she takes in. Important organs, such as the kidneys, brain, and heart, cannot function without a proper amount of fluids. Any loss of fluids from the body can lead to dehydration. Dehydration can be mild, moderate, or severe. It should be treated right away to prevent it from becoming severe. What are the causes? Dehydration may be caused by: Conditions that cause loss of water or other fluids, such as diarrhea, vomiting, or sweating or urinating a lot. Not drinking enough fluids, especially when you are ill or doing activities that require a lot of energy. Other illnesses and conditions, such as fever or infection. Certain medicines, such as medicines that remove excess fluid from the body (diuretics). Lack of safe drinking water. Not being able to get enough water and food. What increases the risk? The following factors may make you more likely to develop this condition: Having a long-term (chronic) illness that has not been treated properly, such as diabetes, heart disease, or kidney disease. Being 21 years of age or older. Having a disability. Living in a place that is high in altitude, where thinner, drier air causes more fluid loss. Doing exercises that put stress on your body for a long time (endurance sports). What are the signs or symptoms? Symptoms of dehydration depend on how severe it is. Mild or moderate dehydration Thirst. Dry lips or dry mouth. Dizziness or light-headedness, especially when standing up from a seated position. Muscle cramps. Dark urine. Urine may be the color of tea. Less urine or tears produced than usual. Headache. Severe dehydration Changes in skin. Your skin may be cold and clammy, blotchy, or pale. Your skin also may not return to normal after being lightly pinched and released. Little or  no tears, urine, or sweat. Changes in vital signs, such as rapid breathing and low blood pressure. Your pulse may be weak or may be faster than 100 beats a minute when you are sitting still. Other changes, such as: Feeling very thirsty. Sunken eyes. Cold hands and feet. Confusion. Being very tired (lethargic) or having trouble waking from sleep. Short-term weight loss. Loss of consciousness. How is this diagnosed? This condition is diagnosed based on your symptoms and a physical exam. You may have blood and urine tests to help confirm the diagnosis. How is this treated? Treatment for this condition depends on how severe it is. Treatment should be started right away. Do not wait until dehydration becomes severe. Severe dehydration is an emergency and needs to be treated in a hospital. Mild or moderate dehydration can be treated at home. You may be asked to: Drink more fluids. Drink an oral rehydration solution (ORS). This drink helps restore proper amounts of fluids and salts and minerals in the blood (electrolytes). Severe dehydration can be treated: With IV fluids. By correcting abnormal levels of electrolytes. This is often done by giving electrolytes through a tube that is passed through your nose and into your stomach (nasogastric tube, or NG tube). By treating the underlying cause of dehydration. Follow these instructions at home: Oral rehydration solution If told by your health care provider, drink an ORS: Make an ORS by following instructions on the package. Start by drinking small amounts, about  cup (120 mL) every 5-10 minutes. Slowly increase how much you drink until you have taken the amount recommended by your health  care provider. Eating and drinking     Drink enough clear fluid to keep your urine pale yellow. If you were told to drink an ORS, finish the ORS first and then start slowly drinking other clear fluids. Drink fluids such as: Water. Do not drink only water.  Doing that can lead to hyponatremia, which is having too little salt (sodium) in the body. Water from ice chips you suck on. Fruit juice that you have added water to (diluted fruit juice). Low-calorie sports drinks. Eat foods that contain a healthy balance of electrolytes, such as bananas, oranges, potatoes, tomatoes, and spinach. Do not drink alcohol. Avoid the following: Drinks that contain a lot of sugar. These include high-calorie sports drinks, fruit juice that is not diluted, and soda. Caffeine. Foods that are greasy or contain a lot of fat or sugar. General instructions Take over-the-counter and prescription medicines only as told by your health care provider. Do not take sodium tablets. Doing that can lead to having too much sodium in the body (hypernatremia). Return to your normal activities as told by your health care provider. Ask your health care provider what activities are safe for you. Keep all follow-up visits as told by your health care provider. This is important. Contact a health care provider if: You have muscle cramps, pain, or discomfort, such as: Pain in your abdomen and the pain gets worse or stays in one area (localizes). Stiff neck. You have a rash. You are more irritable than usual. You are sleepier or have a harder time waking than usual. You feel weak or dizzy. You feel very thirsty. Get help right away if you have: Any symptoms of severe dehydration. Symptoms of vomiting, such as: You cannot eat or drink without vomiting. Vomiting gets worse or does not go away. Vomit includes blood or green matter (bile). Symptoms that get worse with treatment. A fever. A severe headache. Problems with urination or bowel movements, such as: Diarrhea that gets worse or does not go away. Blood in your stool (feces). This may cause stool to look black and tarry. Not urinating, or urinating only a small amount of very dark urine, within 6-8 hours. Trouble  breathing. These symptoms may represent a serious problem that is an emergency. Do not wait to see if the symptoms will go away. Get medical help right away. Call your local emergency services (911 in the [place] Do not drive yourself to the hospital. Summary Dehydration is a condition in which there is not enough water or other fluids in the body. This happens when a person loses more fluids than he or she takes in. Treatment for this condition depends on how severe it is. Treatment should be started right away. Do not wait until dehydration becomes severe. Drink enough clear fluid to keep your urine pale yellow. If you were told to drink an oral rehydration solution (ORS), finish the ORS first and then start slowly drinking other clear fluids. Take over-the-counter and prescription medicines only as told by your health care provider. Get help right away if you have any symptoms of severe dehydration. This information is not intended to replace advice given to you by your health care provider. Make sure you discuss any questions you have with your health care provider. Document Revised: 09/23/2018 Document Reviewed: 09/23/2018 Elsevier Patient Education  [place].   I

## 2021-03-28 DIAGNOSIS — C3432 Malignant neoplasm of lower lobe, left bronchus or lung: Secondary | ICD-10-CM | POA: Diagnosis not present

## 2021-03-28 DIAGNOSIS — I4892 Unspecified atrial flutter: Secondary | ICD-10-CM | POA: Diagnosis not present

## 2021-03-28 DIAGNOSIS — J9621 Acute and chronic respiratory failure with hypoxia: Secondary | ICD-10-CM | POA: Diagnosis not present

## 2021-03-28 DIAGNOSIS — G20 Parkinson's disease: Secondary | ICD-10-CM | POA: Diagnosis not present

## 2021-03-28 DIAGNOSIS — E119 Type 2 diabetes mellitus without complications: Secondary | ICD-10-CM | POA: Diagnosis not present

## 2021-03-28 DIAGNOSIS — U071 COVID-19: Secondary | ICD-10-CM | POA: Diagnosis not present

## 2021-03-29 ENCOUNTER — Other Ambulatory Visit: Payer: Self-pay

## 2021-03-29 ENCOUNTER — Telehealth: Payer: Self-pay

## 2021-03-29 ENCOUNTER — Encounter (HOSPITAL_COMMUNITY): Payer: Self-pay

## 2021-03-29 ENCOUNTER — Inpatient Hospital Stay (HOSPITAL_COMMUNITY)
Admission: EM | Admit: 2021-03-29 | Discharge: 2021-04-03 | DRG: 641 | Disposition: A | Discharge status: Skilled Nursing Facility | Payer: Medicare Other | Attending: Family Medicine | Admitting: Family Medicine

## 2021-03-29 ENCOUNTER — Emergency Department (HOSPITAL_COMMUNITY): Payer: Medicare Other

## 2021-03-29 DIAGNOSIS — E860 Dehydration: Secondary | ICD-10-CM | POA: Diagnosis not present

## 2021-03-29 DIAGNOSIS — Z87891 Personal history of nicotine dependence: Secondary | ICD-10-CM

## 2021-03-29 DIAGNOSIS — C3490 Malignant neoplasm of unspecified part of unspecified bronchus or lung: Secondary | ICD-10-CM | POA: Diagnosis not present

## 2021-03-29 DIAGNOSIS — Z7984 Long term (current) use of oral hypoglycemic drugs: Secondary | ICD-10-CM

## 2021-03-29 DIAGNOSIS — E785 Hyperlipidemia, unspecified: Secondary | ICD-10-CM | POA: Diagnosis present

## 2021-03-29 DIAGNOSIS — M6259 Muscle wasting and atrophy, not elsewhere classified, multiple sites: Secondary | ICD-10-CM | POA: Diagnosis not present

## 2021-03-29 DIAGNOSIS — N3000 Acute cystitis without hematuria: Secondary | ICD-10-CM

## 2021-03-29 DIAGNOSIS — L89152 Pressure ulcer of sacral region, stage 2: Secondary | ICD-10-CM | POA: Diagnosis present

## 2021-03-29 DIAGNOSIS — R627 Adult failure to thrive: Secondary | ICD-10-CM | POA: Diagnosis present

## 2021-03-29 DIAGNOSIS — Z923 Personal history of irradiation: Secondary | ICD-10-CM | POA: Diagnosis not present

## 2021-03-29 DIAGNOSIS — C3432 Malignant neoplasm of lower lobe, left bronchus or lung: Secondary | ICD-10-CM | POA: Diagnosis present

## 2021-03-29 DIAGNOSIS — R8271 Bacteriuria: Secondary | ICD-10-CM | POA: Diagnosis present

## 2021-03-29 DIAGNOSIS — R1312 Dysphagia, oropharyngeal phase: Secondary | ICD-10-CM | POA: Diagnosis not present

## 2021-03-29 DIAGNOSIS — G20 Parkinson's disease: Secondary | ICD-10-CM | POA: Diagnosis present

## 2021-03-29 DIAGNOSIS — Z8249 Family history of ischemic heart disease and other diseases of the circulatory system: Secondary | ICD-10-CM

## 2021-03-29 DIAGNOSIS — U071 COVID-19: Secondary | ICD-10-CM | POA: Diagnosis not present

## 2021-03-29 DIAGNOSIS — R0902 Hypoxemia: Secondary | ICD-10-CM | POA: Diagnosis not present

## 2021-03-29 DIAGNOSIS — J439 Emphysema, unspecified: Secondary | ICD-10-CM | POA: Diagnosis not present

## 2021-03-29 DIAGNOSIS — R296 Repeated falls: Secondary | ICD-10-CM | POA: Diagnosis present

## 2021-03-29 DIAGNOSIS — D500 Iron deficiency anemia secondary to blood loss (chronic): Secondary | ICD-10-CM | POA: Diagnosis not present

## 2021-03-29 DIAGNOSIS — Z79899 Other long term (current) drug therapy: Secondary | ICD-10-CM

## 2021-03-29 DIAGNOSIS — F32A Depression, unspecified: Secondary | ICD-10-CM | POA: Diagnosis present

## 2021-03-29 DIAGNOSIS — J90 Pleural effusion, not elsewhere classified: Secondary | ICD-10-CM | POA: Diagnosis not present

## 2021-03-29 DIAGNOSIS — R059 Cough, unspecified: Secondary | ICD-10-CM | POA: Diagnosis not present

## 2021-03-29 DIAGNOSIS — Z7401 Bed confinement status: Secondary | ICD-10-CM | POA: Diagnosis not present

## 2021-03-29 DIAGNOSIS — E440 Moderate protein-calorie malnutrition: Secondary | ICD-10-CM | POA: Insufficient documentation

## 2021-03-29 DIAGNOSIS — E876 Hypokalemia: Secondary | ICD-10-CM | POA: Diagnosis present

## 2021-03-29 DIAGNOSIS — E041 Nontoxic single thyroid nodule: Secondary | ICD-10-CM | POA: Diagnosis present

## 2021-03-29 DIAGNOSIS — D509 Iron deficiency anemia, unspecified: Secondary | ICD-10-CM | POA: Diagnosis present

## 2021-03-29 DIAGNOSIS — Z809 Family history of malignant neoplasm, unspecified: Secondary | ICD-10-CM

## 2021-03-29 DIAGNOSIS — I4892 Unspecified atrial flutter: Secondary | ICD-10-CM | POA: Diagnosis not present

## 2021-03-29 DIAGNOSIS — G20A1 Parkinson's disease without dyskinesia, without mention of fluctuations: Secondary | ICD-10-CM | POA: Diagnosis present

## 2021-03-29 DIAGNOSIS — R4182 Altered mental status, unspecified: Secondary | ICD-10-CM | POA: Diagnosis not present

## 2021-03-29 DIAGNOSIS — E119 Type 2 diabetes mellitus without complications: Secondary | ICD-10-CM | POA: Diagnosis not present

## 2021-03-29 DIAGNOSIS — J9611 Chronic respiratory failure with hypoxia: Secondary | ICD-10-CM | POA: Diagnosis present

## 2021-03-29 DIAGNOSIS — Z20822 Contact with and (suspected) exposure to covid-19: Secondary | ICD-10-CM | POA: Diagnosis not present

## 2021-03-29 DIAGNOSIS — R41841 Cognitive communication deficit: Secondary | ICD-10-CM | POA: Diagnosis not present

## 2021-03-29 DIAGNOSIS — E1169 Type 2 diabetes mellitus with other specified complication: Secondary | ICD-10-CM | POA: Diagnosis not present

## 2021-03-29 DIAGNOSIS — Z7901 Long term (current) use of anticoagulants: Secondary | ICD-10-CM

## 2021-03-29 DIAGNOSIS — L8990 Pressure ulcer of unspecified site, unspecified stage: Secondary | ICD-10-CM | POA: Insufficient documentation

## 2021-03-29 DIAGNOSIS — I4891 Unspecified atrial fibrillation: Secondary | ICD-10-CM | POA: Diagnosis present

## 2021-03-29 DIAGNOSIS — Z9221 Personal history of antineoplastic chemotherapy: Secondary | ICD-10-CM | POA: Diagnosis not present

## 2021-03-29 DIAGNOSIS — R278 Other lack of coordination: Secondary | ICD-10-CM | POA: Diagnosis not present

## 2021-03-29 DIAGNOSIS — J9621 Acute and chronic respiratory failure with hypoxia: Secondary | ICD-10-CM | POA: Diagnosis not present

## 2021-03-29 DIAGNOSIS — Z9981 Dependence on supplemental oxygen: Secondary | ICD-10-CM | POA: Diagnosis not present

## 2021-03-29 DIAGNOSIS — I10 Essential (primary) hypertension: Secondary | ICD-10-CM | POA: Diagnosis not present

## 2021-03-29 DIAGNOSIS — J189 Pneumonia, unspecified organism: Secondary | ICD-10-CM | POA: Diagnosis not present

## 2021-03-29 DIAGNOSIS — R531 Weakness: Secondary | ICD-10-CM | POA: Diagnosis not present

## 2021-03-29 DIAGNOSIS — M6281 Muscle weakness (generalized): Secondary | ICD-10-CM | POA: Diagnosis not present

## 2021-03-29 HISTORY — DX: Unspecified atrial flutter: I48.92

## 2021-03-29 HISTORY — DX: Malignant neoplasm of unspecified part of unspecified bronchus or lung: C34.90

## 2021-03-29 LAB — URINALYSIS, ROUTINE W REFLEX MICROSCOPIC
Bilirubin Urine: NEGATIVE
Glucose, UA: NEGATIVE mg/dL
Ketones, ur: NEGATIVE mg/dL
Nitrite: NEGATIVE
RBC / HPF: >50 RBC/hpf — ABNORMAL HIGH (ref 0–5)
Specific Gravity, Urine: >1.03 — ABNORMAL HIGH (ref 1.005–1.030)
pH: 6 (ref 5.0–8.0)

## 2021-03-29 LAB — PROTIME-INR
INR: 1.8 — ABNORMAL HIGH (ref 0.8–1.2)
Prothrombin Time: 21 seconds — ABNORMAL HIGH (ref 11.4–15.2)

## 2021-03-29 LAB — I-STAT CHEM 8, ED
BUN: 13 mg/dL (ref 8–23)
Calcium, Ion: 1.16 mmol/L (ref 1.15–1.40)
Chloride: 103 mmol/L (ref 98–111)
Creatinine, Ser: 0.7 mg/dL (ref 0.61–1.24)
Glucose, Bld: 89 mg/dL (ref 70–99)
HCT: 28 % — ABNORMAL LOW (ref 39.0–52.0)
Hemoglobin: 9.5 g/dL — ABNORMAL LOW (ref 13.0–17.0)
Potassium: 3.5 mmol/L (ref 3.5–5.1)
Sodium: 143 mmol/L (ref 135–145)
TCO2: 28 mmol/L (ref 22–32)

## 2021-03-29 LAB — CBC
HCT: 31.2 % — ABNORMAL LOW (ref 39.0–52.0)
Hemoglobin: 9.4 g/dL — ABNORMAL LOW (ref 13.0–17.0)
MCH: 24.9 pg — ABNORMAL LOW (ref 26.0–34.0)
MCHC: 30.1 g/dL (ref 30.0–36.0)
MCV: 82.8 fL (ref 80.0–100.0)
Platelets: 209 10*3/uL (ref 150–400)
RBC: 3.77 MIL/uL — ABNORMAL LOW (ref 4.22–5.81)
RDW: 30.8 % — ABNORMAL HIGH (ref 11.5–15.5)
WBC: 7.5 10*3/uL (ref 4.0–10.5)
nRBC: 0 % (ref 0.0–0.2)

## 2021-03-29 LAB — DIFFERENTIAL
Abs Immature Granulocytes: 0.05 10*3/uL (ref 0.00–0.07)
Basophils Absolute: 0 10*3/uL (ref 0.0–0.1)
Basophils Relative: 0 %
Eosinophils Absolute: 0 10*3/uL (ref 0.0–0.5)
Eosinophils Relative: 0 %
Immature Granulocytes: 1 %
Lymphocytes Relative: 3 %
Lymphs Abs: 0.3 10*3/uL — ABNORMAL LOW (ref 0.7–4.0)
Monocytes Absolute: 0.9 10*3/uL (ref 0.1–1.0)
Monocytes Relative: 12 %
Neutro Abs: 6.3 10*3/uL (ref 1.7–7.7)
Neutrophils Relative %: 84 %
Smear Review: NORMAL

## 2021-03-29 LAB — COMPREHENSIVE METABOLIC PANEL
ALT: 6 U/L (ref 0–44)
AST: 13 U/L — ABNORMAL LOW (ref 15–41)
Albumin: 2.7 g/dL — ABNORMAL LOW (ref 3.5–5.0)
Alkaline Phosphatase: 71 U/L (ref 38–126)
Anion gap: 7 (ref 5–15)
BUN: 16 mg/dL (ref 8–23)
CO2: 27 mmol/L (ref 22–32)
Calcium: 8.7 mg/dL — ABNORMAL LOW (ref 8.9–10.3)
Chloride: 105 mmol/L (ref 98–111)
Creatinine, Ser: 0.85 mg/dL (ref 0.61–1.24)
GFR, Estimated: >60 mL/min (ref >60)
Glucose, Bld: 97 mg/dL (ref 70–99)
Potassium: 3.2 mmol/L — ABNORMAL LOW (ref 3.5–5.1)
Sodium: 139 mmol/L (ref 135–145)
Total Bilirubin: 0.4 mg/dL (ref 0.3–1.2)
Total Protein: 5.9 g/dL — ABNORMAL LOW (ref 6.5–8.1)

## 2021-03-29 LAB — AMMONIA: Ammonia: <10 umol/L (ref 9–35)

## 2021-03-29 LAB — ETHANOL: Alcohol, Ethyl (B): <10 mg/dL (ref <10)

## 2021-03-29 LAB — APTT: aPTT: 36 seconds (ref 24–36)

## 2021-03-29 LAB — BLOOD GAS, VENOUS
Acid-Base Excess: 2.6 mmol/L — ABNORMAL HIGH (ref 0.0–2.0)
Bicarbonate: 28.5 mmol/L — ABNORMAL HIGH (ref 20.0–28.0)
O2 Saturation: 26.6 %
Patient temperature: 98.6
pCO2, Ven: 54 mmHg (ref 44.0–60.0)
pH, Ven: 7.341 (ref 7.250–7.430)
pO2, Ven: <31 mmHg — CL (ref 32.0–45.0)

## 2021-03-29 LAB — RESP PANEL BY RT-PCR (FLU A&B, COVID) ARPGX2
Influenza A by PCR: NEGATIVE
Influenza B by PCR: NEGATIVE
SARS Coronavirus 2 by RT PCR: NEGATIVE

## 2021-03-29 IMAGING — DX DG CHEST 1V PORT
1 series · 1 of 1 positions shown · non-contrast
Comparison: Chest x-rays dated [DATE] and [DATE]. Chest CT
dated [DATE]. PET-CT dated [DATE].

CLINICAL DATA: Generalized weakness.

EXAM:
PORTABLE CHEST 1 VIEW

[chest ap]
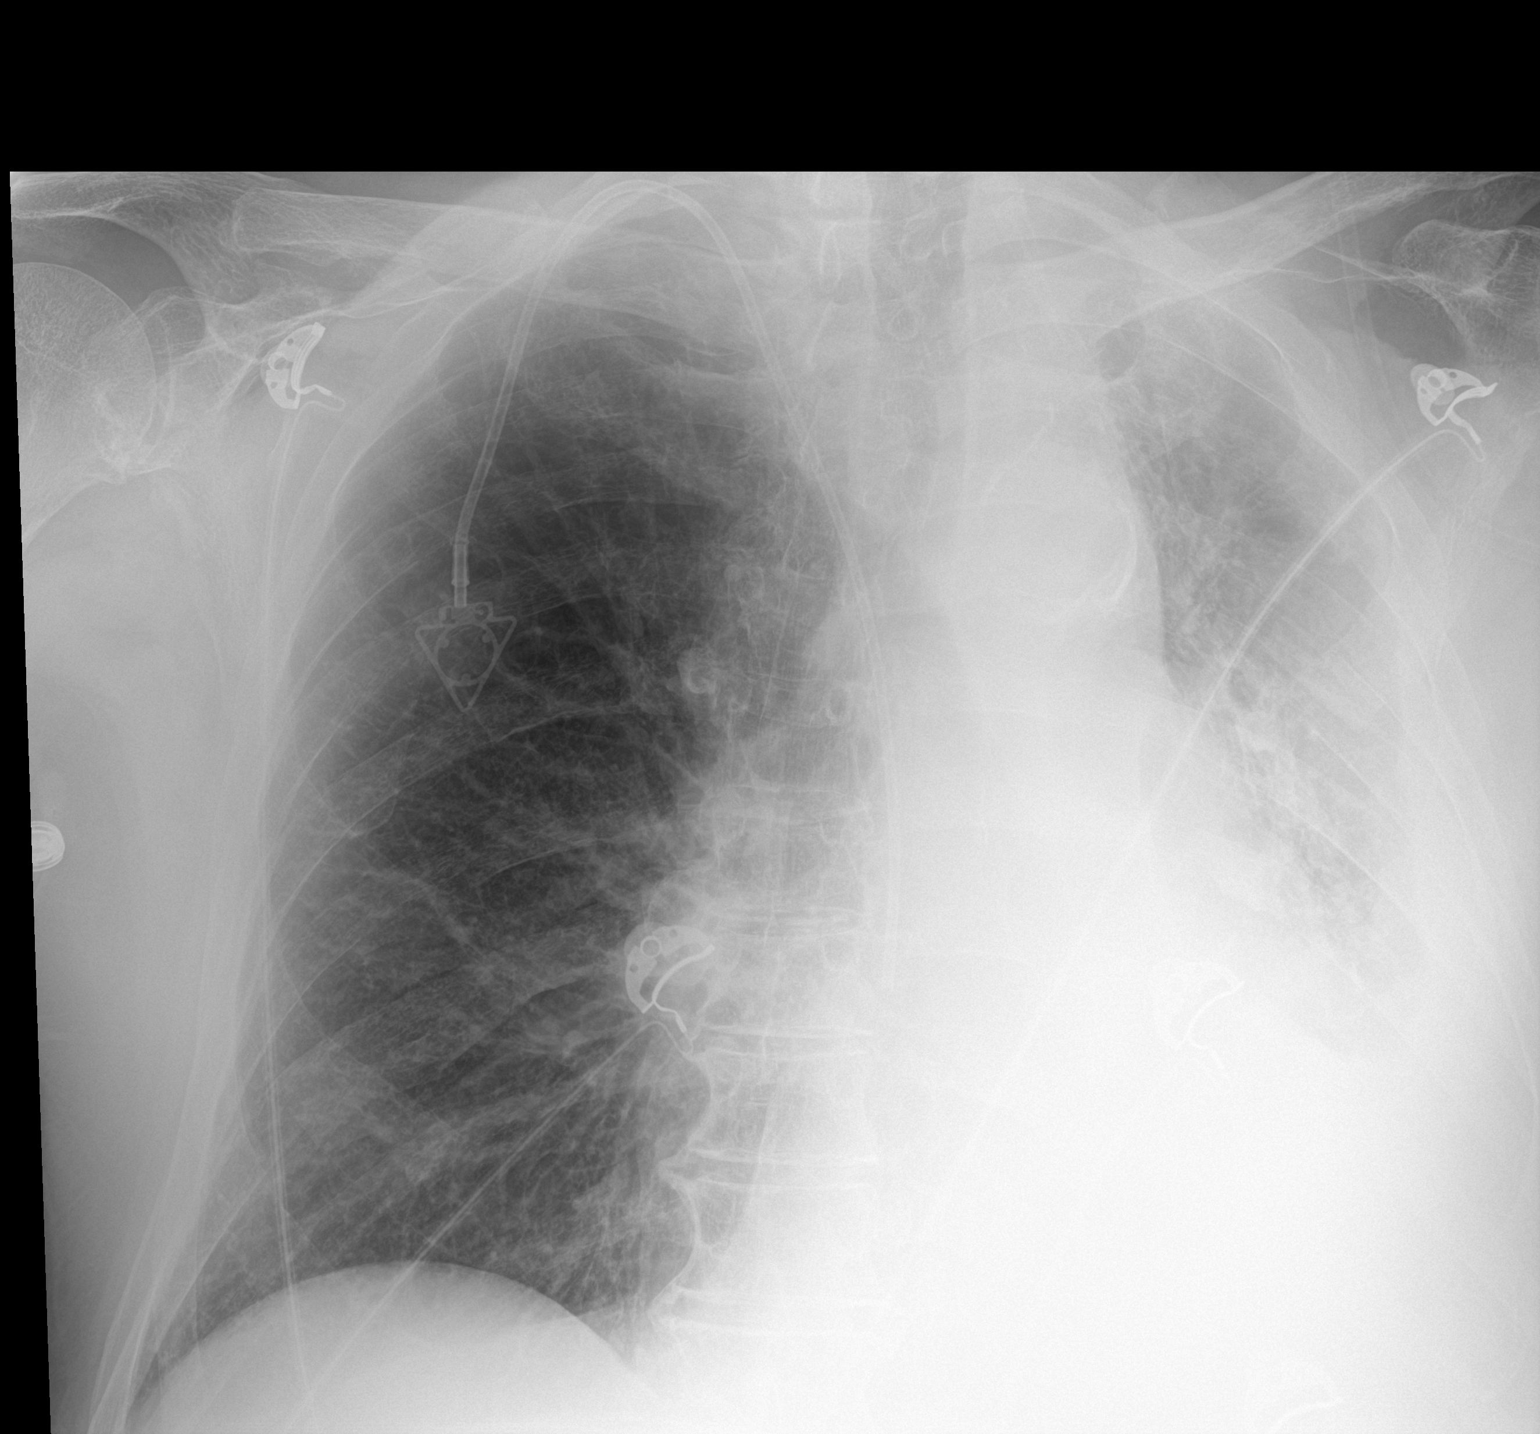

[1 of 1 positions shown; findings below may reference images not displayed]

FINDINGS: Heart size and mediastinal contours are grossly stable. Again noted
is a LEFT pleural effusion and extensive airspace opacities
throughout the LEFT lung with volume loss, compatible with the
postobstructive changes related to the LEFT perihilar mass as
demonstrated on earlier chest CT and PET-CT.

RIGHT lung is clear. RIGHT chest wall Port-A-Cath in place with tip
adequately positioned at the level of the mid/lower SVC. Osseous
structures about the chest are unremarkable.
IMPRESSION: 1. No significant change compared to the chest x-ray of [DATE].
Again noted is a LEFT pleural effusion and airspace opacities
throughout the LEFT lung with volume loss, compatible with the
postobstructive changes related to the LEFT perihilar mass as
demonstrated on earlier chest CT and PET-CT.
2. No new findings seen.  RIGHT lung is clear.

## 2021-03-29 IMAGING — CT CT HEAD W/O CM
3 series · 15 of 47 positions shown, 18 images · non-contrast
Comparison: MRI [DATE], CT [DATE]

CLINICAL DATA: Altered mental status



[Series 2: head wo · axial · 0.47mm/px · z∈[+242,+397]mm · 9 of 37 slices shown, 12 images]
[im 3/37  brain]
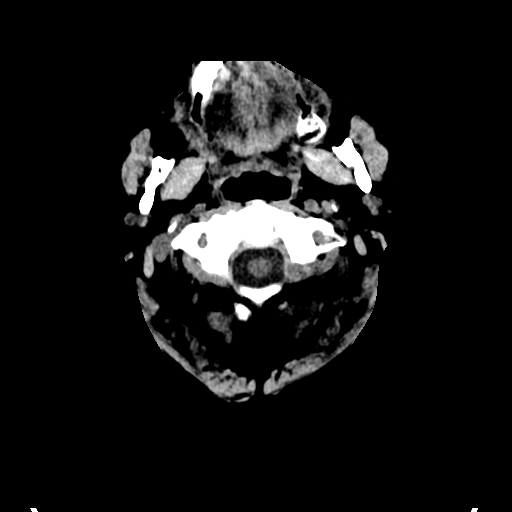
[im 3/37  bone]
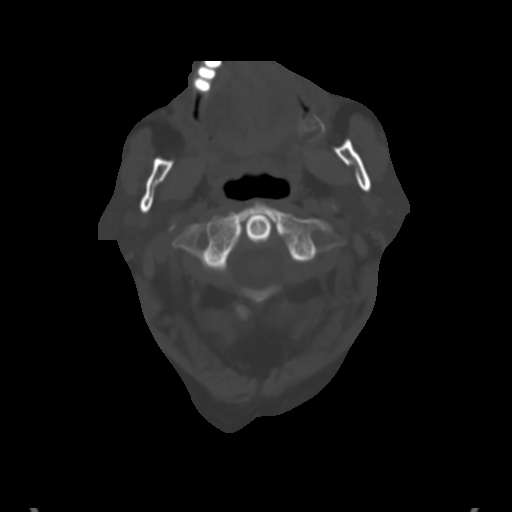
[im 7/37  brain]
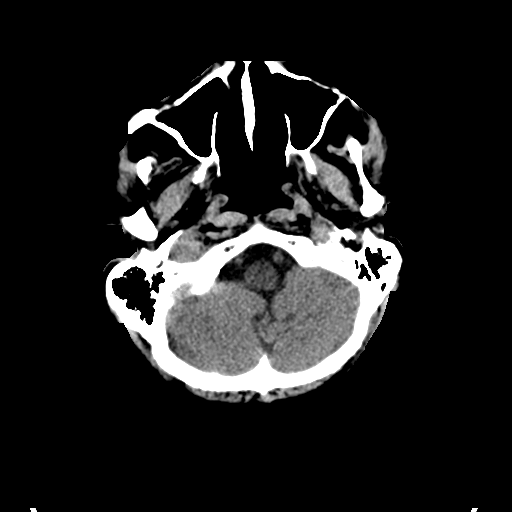
[im 10/37  brain]
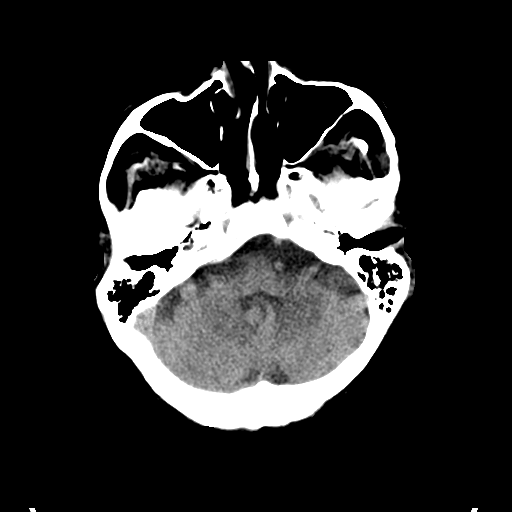
[im 14/37  brain]
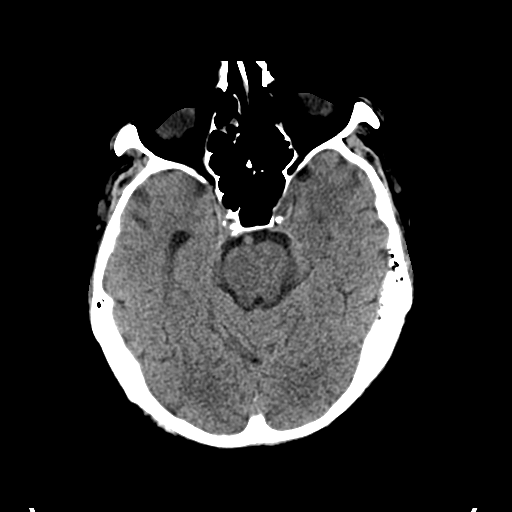
[im 19/37  brain]
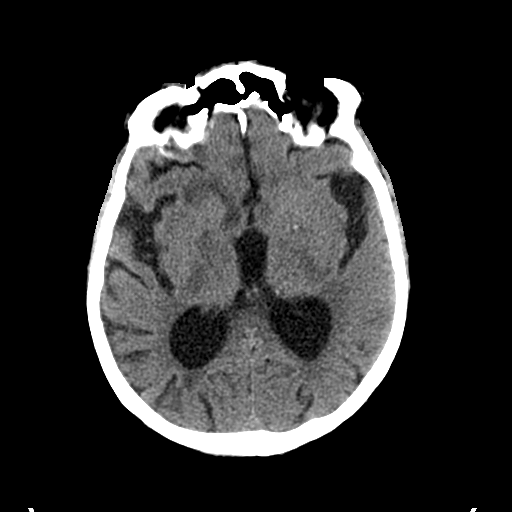
[im 19/37  bone]
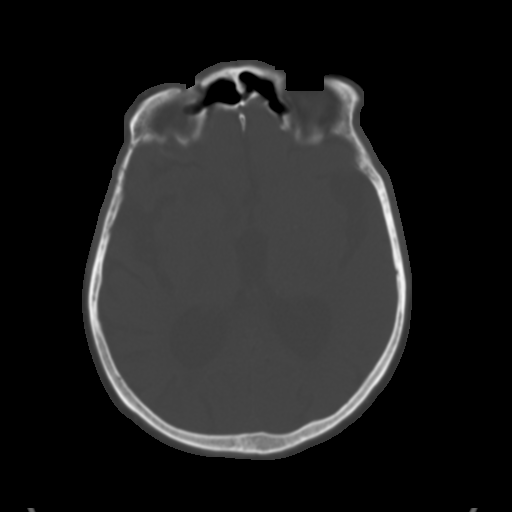
[im 23/37  brain]
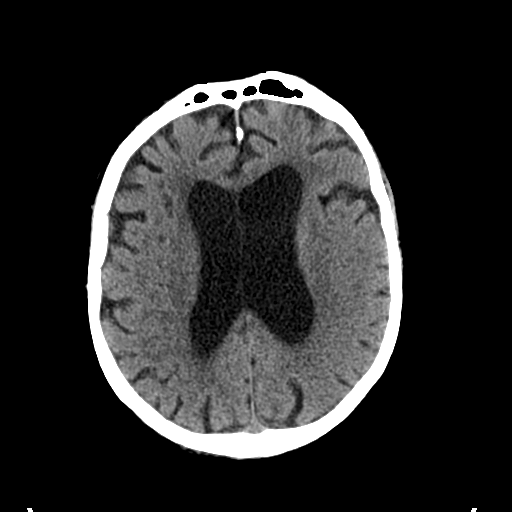
[im 27/37  brain]
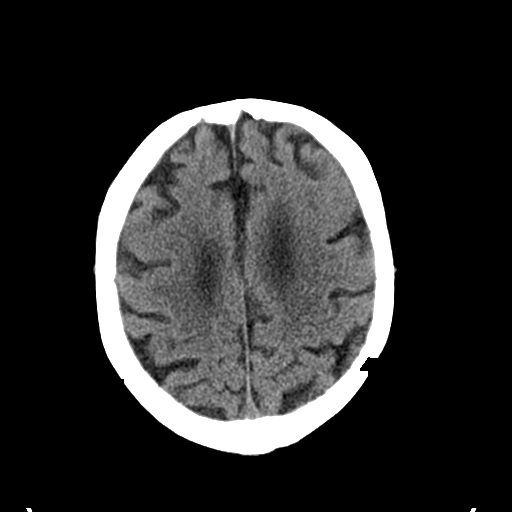
[im 30/37  brain]
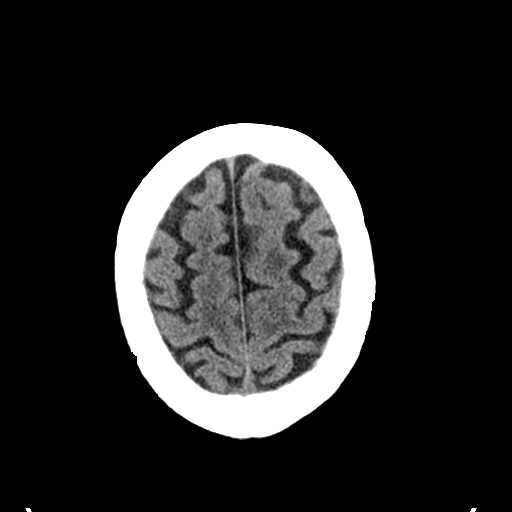
[im 34/37  brain]
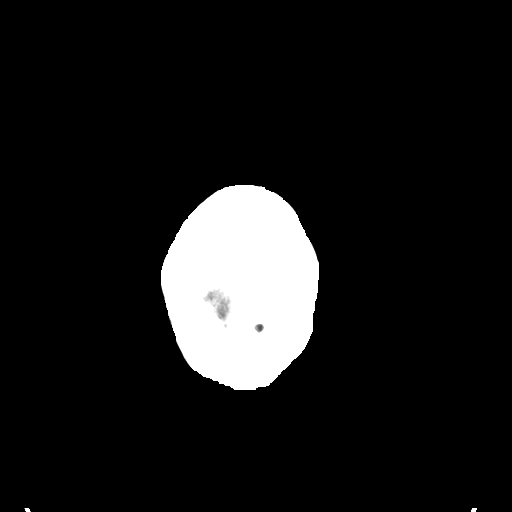
[im 34/37  bone]
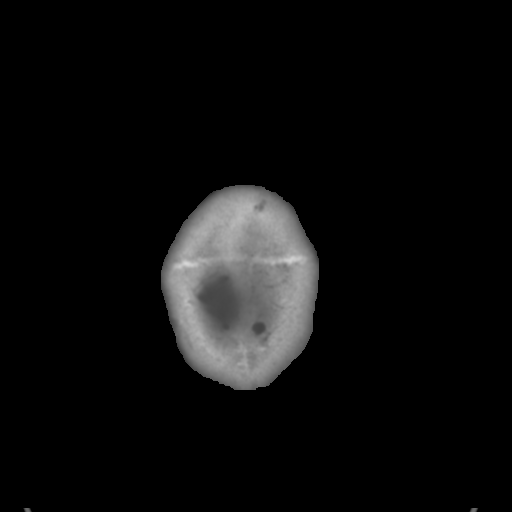

[Series 5: coronal soft tissue · coronal · 0.37mm/px · 3 of 76 slices shown]
[im 26/76  brain]
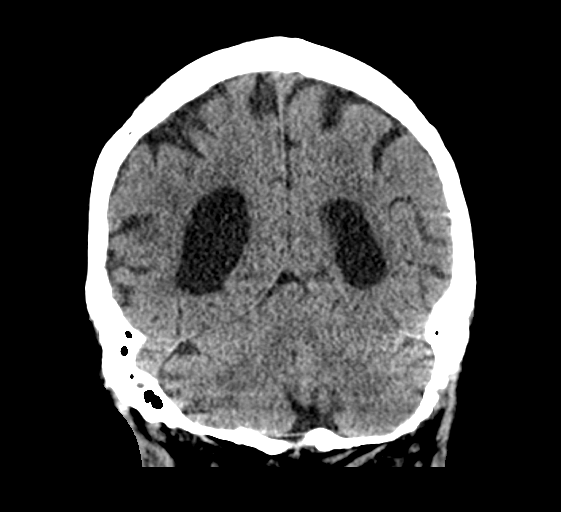
[im 34/76  brain]
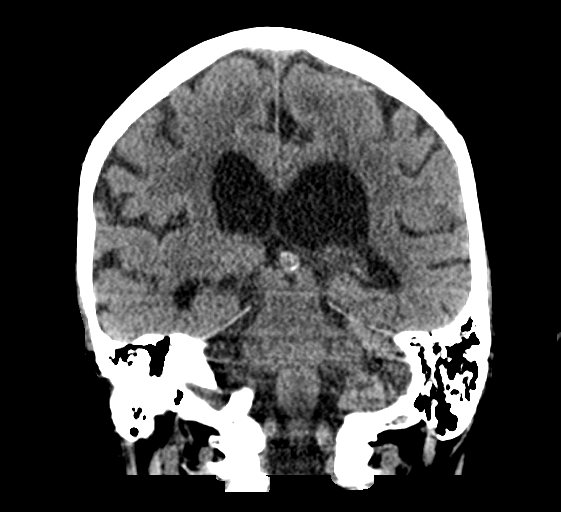
[im 42/76  brain]
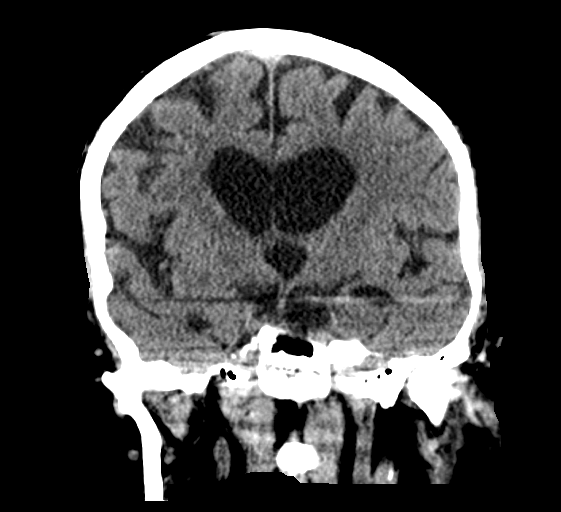

[Series 6: sagittal soft tissue · sagittal · 0.35mm/px · 3 of 56 slices shown]
[im 19/56  brain]
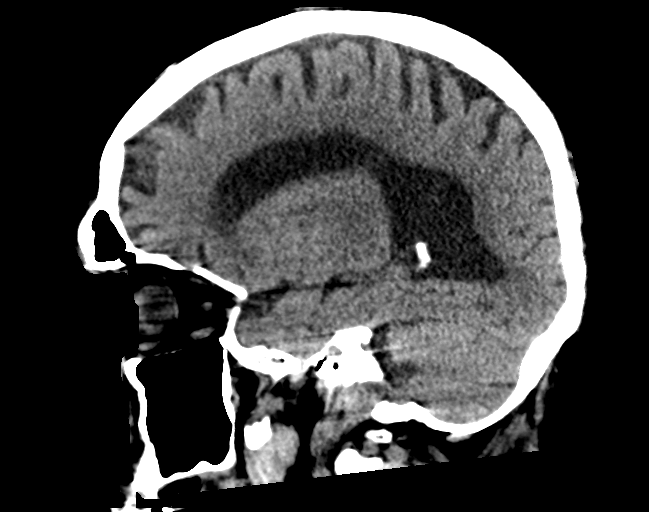
[im 28/56  brain]
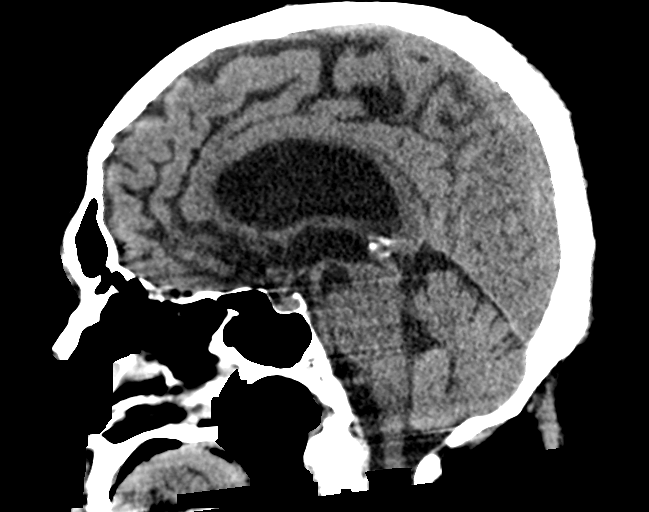
[im 37/56  brain]
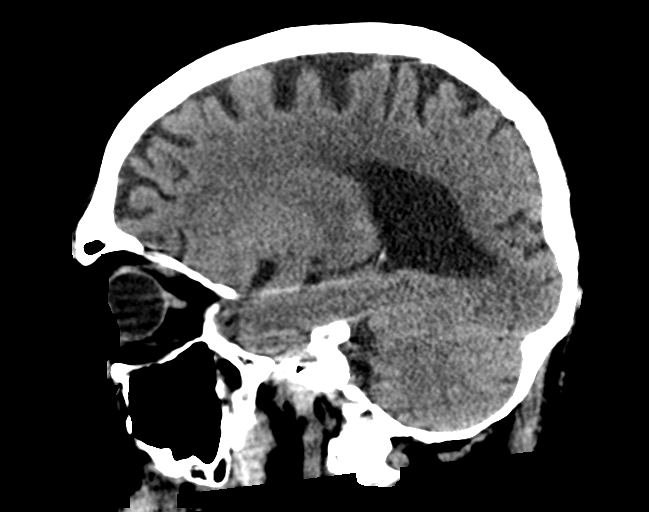

[15 of 47 positions shown; findings below may reference images not displayed]

FINDINGS: Brain: Normal anatomic configuration. Parenchymal volume loss is
commensurate with the patient's age. Mild periventricular white
matter changes are present likely reflecting the sequela of small
vessel ischemia. No abnormal intra or extra-axial mass lesion or
fluid collection. No abnormal mass effect or midline shift. No
evidence of acute intracranial hemorrhage or infarct. Mild
ventriculomegaly is stable and appears commensurate with the degree
of parenchymal volume loss. Cerebellum unremarkable.

Vascular: Prominence of the M1 segment of the left MCA is stable and
unchanged from prior MRI examination. Otherwise, no asymmetric
hyperdense vasculature at the skull base.

Skull: Intact

Sinuses/Orbits: Paranasal sinuses are clear. Ocular lenses have been
removed. Orbits are otherwise unremarkable.

Other: Mastoid air cells and middle ear cavities are clear.
IMPRESSION: No acute intracranial abnormality.

Stable senescent change.  Stable mild ventriculomegaly.

## 2021-03-29 MED ORDER — POTASSIUM CHLORIDE CRYS ER 20 MEQ PO TBCR
20.0000 meq | EXTENDED_RELEASE_TABLET | Freq: Once | ORAL | Status: AC
  Administered 2021-03-29: 20 meq via ORAL
  Filled 2021-03-29: qty 1

## 2021-03-29 MED ORDER — DONEPEZIL HCL 10 MG PO TABS
10.0000 mg | ORAL_TABLET | Freq: Every day | ORAL | Status: DC
  Administered 2021-03-30 – 2021-04-02 (×5): 10 mg via ORAL
  Filled 2021-03-29: qty 1
  Filled 2021-03-29: qty 2
  Filled 2021-03-29 (×3): qty 1

## 2021-03-29 MED ORDER — ONDANSETRON HCL 4 MG/2ML IJ SOLN: 4.0000 mg | Freq: Four times a day (QID) | INTRAMUSCULAR | Status: DC | PRN

## 2021-03-29 MED ORDER — OMEPRAZOLE MAGNESIUM 20 MG PO TBEC: 20.0000 mg | DELAYED_RELEASE_TABLET | Freq: Every evening | ORAL | Status: DC

## 2021-03-29 MED ORDER — DILTIAZEM HCL ER COATED BEADS 180 MG PO CP24
300.0000 mg | ORAL_CAPSULE | Freq: Every day | ORAL | Status: DC
  Administered 2021-03-30 – 2021-04-03 (×5): 300 mg via ORAL
  Filled 2021-03-29 (×5): qty 1

## 2021-03-29 MED ORDER — ACETAMINOPHEN 650 MG RE SUPP: 650.0000 mg | Freq: Four times a day (QID) | RECTAL | Status: DC | PRN

## 2021-03-29 MED ORDER — SENNOSIDES-DOCUSATE SODIUM 8.6-50 MG PO TABS
1.0000 | ORAL_TABLET | Freq: Every evening | ORAL | Status: DC | PRN
  Administered 2021-04-01: 1 via ORAL
  Filled 2021-03-29: qty 1

## 2021-03-29 MED ORDER — ONDANSETRON HCL 4 MG PO TABS: 4.0000 mg | ORAL_TABLET | Freq: Four times a day (QID) | ORAL | Status: DC | PRN

## 2021-03-29 MED ORDER — ACETAMINOPHEN 325 MG PO TABS
650.0000 mg | ORAL_TABLET | Freq: Four times a day (QID) | ORAL | Status: DC | PRN
  Administered 2021-03-31: 650 mg via ORAL
  Filled 2021-03-29: qty 2

## 2021-03-29 MED ORDER — FERROUS GLUCONATE 324 (38 FE) MG PO TABS
324.0000 mg | ORAL_TABLET | Freq: Every day | ORAL | Status: DC
  Administered 2021-03-30 – 2021-04-03 (×5): 324 mg via ORAL
  Filled 2021-03-29 (×5): qty 1

## 2021-03-29 MED ORDER — CEFUROXIME AXETIL 250 MG PO TABS
250.0000 mg | ORAL_TABLET | Freq: Two times a day (BID) | ORAL | 0 refills | Status: AC
Start: 2021-03-29 — End: 2021-04-05

## 2021-03-29 MED ORDER — MIRTAZAPINE 15 MG PO TABS
15.0000 mg | ORAL_TABLET | Freq: Every day | ORAL | Status: DC
  Administered 2021-03-30 – 2021-04-02 (×5): 15 mg via ORAL
  Filled 2021-03-29: qty 2
  Filled 2021-03-29 (×4): qty 1

## 2021-03-29 MED ORDER — SODIUM CHLORIDE 0.9 % IV BOLUS
1000.0000 mL | Freq: Once | INTRAVENOUS | Status: AC
  Administered 2021-03-29: 1000 mL via INTRAVENOUS

## 2021-03-29 MED ORDER — SODIUM CHLORIDE 0.9 % IV SOLN: 1.0000 g | INTRAVENOUS | Status: DC

## 2021-03-29 MED ORDER — BUPROPION HCL ER (XL) 150 MG PO TB24: 150.0000 mg | ORAL_TABLET | Freq: Two times a day (BID) | ORAL | Status: DC

## 2021-03-29 MED ORDER — CARBIDOPA-LEVODOPA ER 25-100 MG PO TBCR
1.0000 | EXTENDED_RELEASE_TABLET | Freq: Two times a day (BID) | ORAL | Status: DC
  Administered 2021-03-30 – 2021-04-03 (×10): 1 via ORAL
  Filled 2021-03-29 (×10): qty 1

## 2021-03-29 MED ORDER — ATORVASTATIN CALCIUM 20 MG PO TABS
20.0000 mg | ORAL_TABLET | Freq: Every day | ORAL | Status: DC
  Administered 2021-03-30 – 2021-04-02 (×5): 20 mg via ORAL
  Filled 2021-03-29 (×2): qty 1
  Filled 2021-03-29: qty 2
  Filled 2021-03-29 (×2): qty 1

## 2021-03-29 MED ORDER — HYDROCODONE-ACETAMINOPHEN 5-325 MG PO TABS
1.0000 | ORAL_TABLET | Freq: Four times a day (QID) | ORAL | Status: DC | PRN
  Administered 2021-03-30 – 2021-04-02 (×2): 1 via ORAL
  Filled 2021-03-29 (×2): qty 1

## 2021-03-29 MED ORDER — APIXABAN 5 MG PO TABS
5.0000 mg | ORAL_TABLET | Freq: Two times a day (BID) | ORAL | Status: DC
  Administered 2021-03-30 – 2021-04-03 (×10): 5 mg via ORAL
  Filled 2021-03-29 (×10): qty 1

## 2021-03-29 MED ORDER — ALBUTEROL SULFATE (2.5 MG/3ML) 0.083% IN NEBU: 2.5000 mg | INHALATION_SOLUTION | RESPIRATORY_TRACT | Status: DC | PRN

## 2021-03-29 MED ORDER — SODIUM CHLORIDE 0.9% FLUSH
3.0000 mL | Freq: Two times a day (BID) | INTRAVENOUS | Status: DC
  Administered 2021-03-30 – 2021-03-31 (×4): 3 mL via INTRAVENOUS

## 2021-03-29 MED ORDER — SODIUM CHLORIDE 0.9 % IV SOLN
100.0000 mL/h | INTRAVENOUS | Status: AC
  Administered 2021-03-29: 100 mL/h via INTRAVENOUS

## 2021-03-29 MED ORDER — POTASSIUM CHLORIDE CRYS ER 20 MEQ PO TBCR: 20.0000 meq | EXTENDED_RELEASE_TABLET | Freq: Every day | ORAL | Status: DC

## 2021-03-29 MED ORDER — SERTRALINE HCL 50 MG PO TABS
150.0000 mg | ORAL_TABLET | Freq: Every day | ORAL | Status: DC
  Administered 2021-03-30 – 2021-04-03 (×5): 150 mg via ORAL
  Filled 2021-03-29 (×3): qty 1
  Filled 2021-03-29: qty 3
  Filled 2021-03-29: qty 1

## 2021-03-29 MED ORDER — ARFORMOTEROL TARTRATE 15 MCG/2ML IN NEBU
15.0000 ug | INHALATION_SOLUTION | Freq: Two times a day (BID) | RESPIRATORY_TRACT | Status: DC
  Administered 2021-03-30 – 2021-04-03 (×9): 15 ug via RESPIRATORY_TRACT
  Filled 2021-03-29 (×11): qty 2

## 2021-03-29 MED ORDER — SODIUM CHLORIDE 0.9 % IV SOLN
1.0000 g | Freq: Once | INTRAVENOUS | Status: AC
Start: 2021-03-29 — End: 2021-03-29
  Administered 2021-03-29: 1 g via INTRAVENOUS
  Filled 2021-03-29: qty 10

## 2021-03-29 MED ORDER — MODAFINIL 200 MG PO TABS
100.0000 mg | ORAL_TABLET | Freq: Every day | ORAL | Status: DC
  Administered 2021-03-30 – 2021-04-03 (×5): 100 mg via ORAL
  Filled 2021-03-29 (×5): qty 1

## 2021-03-29 MED ORDER — METOPROLOL TARTRATE 25 MG PO TABS
25.0000 mg | ORAL_TABLET | Freq: Two times a day (BID) | ORAL | Status: DC
  Administered 2021-03-30 – 2021-04-02 (×9): 25 mg via ORAL
  Filled 2021-03-29 (×10): qty 1

## 2021-03-29 NOTE — ED Notes (Signed)
Blood tubes sent to lab [facility] blue

## 2021-03-29 NOTE — Assessment & Plan Note (Signed)
Continue Wellbutrin, mirtazapine, sertraline.

## 2021-03-29 NOTE — Hospital Course (Signed)
James [name] is a 76 y.o. male with medical history significant for stage IIIa non-small cell lung cancer (recently completed radiation, chemotherapy held due to functional decline), COPD, chronic respiratory failure with hypoxia on 4 L supplemental O2 via [place], paroxysmal atrial flutter on Eliquis, Parkinson's disease, T2DM, HTN, HLD, iron deficiency anemia, depression who is admitted with generalized weakness, dehydration, and frequent falls.

## 2021-03-29 NOTE — Assessment & Plan Note (Signed)
Hemoglobin stable.  Continue iron supplement.

## 2021-03-29 NOTE — Assessment & Plan Note (Addendum)
Generalized weakness/frequent falls/failure to thrive Patient with progressive generalized weakness/deconditioning multifactorial due to cancer, recent chemoradiation therapy, Parkinson's disease, and poor oral intake.  He appears dehydrated on exam. -Continue IV fluid hydration overnight -PT/OT eval -Continue fall precautions

## 2021-03-29 NOTE — Assessment & Plan Note (Signed)
Continue atorvastatin

## 2021-03-29 NOTE — Assessment & Plan Note (Addendum)
Given IV ceftriaxone in the ED.  Denies any urinary symptoms.  Follow urine culture.

## 2021-03-29 NOTE — Assessment & Plan Note (Signed)
Continue Sinemet ?

## 2021-03-29 NOTE — ED Triage Notes (Signed)
Pt BIB EMS from home for generalized weakness. Pt stated he's been "feeling weak for the last three weeks and it's gradually gotten worse". Pt noticed urine being dark for four days and he has pain under his right ribs that's worse when coughing or breathing. He also had radiation on the 1st for left lung cancer and has a hx of Afib  104/67 bp 143 cbg 94% 4L o2 74 hr  20g left hand and receive 568ml of NS.

## 2021-03-29 NOTE — Discharge Instructions (Addendum)
Take the antibiotics as prescribed.  Follow-up with your doctor to be rechecked.  Return as needed for fever or other worsening symptoms  Tips for Managing Taste Changes/Metallic Taste  Taste changes Try increasing the flavor of foods with herbs, spices, seasoning blends, marinades, and sauces Season foods with tart flavors such as citrus fruits, vinegar-based dressings, and pickled foods. You may need to avoid these foods if you are experiencing sore mouth or throat. Add sugar to improve the flavor of salty foods Add salt to decrease the sweetness of sugary foods Use lemon juice on foods to mask metallic taste Use plastic utensils if you experience a metallic taste Practice good oral hygiene Rinse mouth with baking soda and saltwater rinse (1 quart water mixed with  teaspoon of salt and 1 teaspoon of baking soda) before and after meals to cleanse the tastebuds  From the [employer], "Head and Neck Cancer Nutrition Therapy"

## 2021-03-29 NOTE — H&P (Signed)
History and Physical    James [name] NKN:[id number] DOB: April 06, 1945 DOA: 03/29/2021  PCP: [name], MD  Patient coming from: Home  I have personally briefly reviewed patient's old medical records in [place]  Chief Complaint: Generalized weakness  HPI: James [name] is a 76 y.o. male with medical history significant for stage IIIa non-small cell lung cancer (recently completed radiation, chemotherapy held due to functional decline), COPD, chronic respiratory failure with hypoxia on 4 L supplemental O2 via [place], paroxysmal atrial flutter on Eliquis, Parkinson's disease, T2DM, HTN, HLD, iron deficiency anemia, depression who presented to the ED for evaluation of generalized weakness.  Patient reports 1 month of progressive generalized weakness with frequent falls.  He has felt very deconditioned and dehydrated.  He says he falls easily due to his Parkinson's disease.  He has not had any significant injury related to his falls.  He denies any lightheadedness/dizziness, recent chest pain, palpitations, nausea, vomiting.  He reports chronic diarrhea and chronic shortness of breath with cough productive of clear sputum, unchanged from baseline.  He has been having dark brown appearing urine recently.  He reports poor appetite which has limited his ability to maintain adequate oral intake.  Patient states he normally ambulates with use of a cane.  He has a walker but does not use it.  He is currently receiving PT/OT at home.  When he is following he is now feeling too weak to even stand up on his own or with the help of his wife.  ED Course   Labs/Imaging on admission: I have personally reviewed following labs and imaging studies. Initial vitals showed BP 114/68, pulse 70, RR 21, temp 97.7 F, SPO2 100% on 4 L supplemental O2 via [place].  Labs show WBC 7.5, hemoglobin 9.4, platelets 209,000, sodium 139, potassium 3.2, bicarb 27, BUN 16, creatinine 0.85, serum glucose 97, serum ethanol  <10.  Urinalysis shows negative nitrites, trace leukocytes, >50 RBC/hpf, 11-20 WBC/hpf, many bacteria on microscopy.  Urine culture in process.  SARS-CoV-2 and influenza PCR negative.  Portable chest x-ray shows unchanged left pleural effusion and airspace opacities throughout the left lung with volume loss compatible with postobstructive changes related to the left perihilar mass demonstrated on prior chest CT and PET/CT.  No new findings seen.  CT head without contrast negative for acute intracranial abnormality.  Stable senescent changes and stable mild ventriculomegaly noted.  Patient was given 1 L normal saline, oral potassium 20 mEq, IV ceftriaxone.  The hospitalist service was consulted to admit for further evaluation and management.  Review of Systems: All systems reviewed and are negative except as documented in history of present illness above.   Past Medical History:  Diagnosis Date   Allergy    seasonal   Blind left eye    legally   Blood transfusion    2002   COPD (chronic obstructive pulmonary disease) (HCC)    Depression    Diabetes mellitus, type 2 (HCC)    Diverticulosis    Dyspnea    ED (erectile dysfunction)    Glaucoma    Hyperlipidemia    Hypertension    Insomnia    Internal and external bleeding hemorrhoids    Iron deficiency anemia    Lung cancer ([place])    Osteoarthritis    Parkinson's disease ([place]) 2014   Dr. [name] PCP  and Dr. [name] at [facility]   Paroxysmal atrial flutter [facility])    Rosacea    Tremor of both hands  Tubular adenoma of colon 03/28/2011    Past Surgical History:  Procedure Laterality Date   ARTHROSCOPIC REPAIR ACL     BALLOON DILATION  01/28/2021   Procedure: BALLOON DILATION;  Surgeon: [name], DO;  Location: [place];  Service: Pulmonary;;   BRONCHIAL BIOPSY  01/28/2021   Procedure: BRONCHIAL BIOPSIES;  Surgeon: [name], DO;  Location: [place];  Service: Pulmonary;;   CARDIOVERSION N/A 03/04/2021   Procedure:  CARDIOVERSION;  Surgeon: [name], MD;  Location: [place];  Service: Cardiovascular;  Laterality: N/A;   [place]   right   COLONOSCOPY WITH PROPOFOL N/A 07/25/2015   Procedure: COLONOSCOPY WITH PROPOFOL;  Surgeon: [name], MD;  Location: [facility] ENDOSCOPY;  Service: Endoscopy;  Laterality: N/A;   CRYOTHERAPY  01/28/2021   Procedure: CRYOTHERAPY;  Surgeon: [name], DO;  Location: [place] ENDOSCOPY;  Service: Pulmonary;;   ESOPHAGOGASTRODUODENOSCOPY (EGD) WITH PROPOFOL N/A 07/25/2015   Procedure: ESOPHAGOGASTRODUODENOSCOPY (EGD) WITH PROPOFOL;  Surgeon: [name], MD;  Location: [facility] ENDOSCOPY;  Service: Endoscopy;  Laterality: N/A;   HEMOSTASIS CONTROL  01/28/2021   Procedure: HEMOSTASIS CONTROL;  Surgeon: [name], DO;  Location: [place];  Service: Pulmonary;;   INTRAVASCULAR PRESSURE WIRE/FFR STUDY N/A 01/22/2021   Procedure: INTRAVASCULAR PRESSURE WIRE/FFR STUDY;  Surgeon: [name], Peter M, MD;  Location: [place] CV LAB;  Service: Cardiovascular;  Laterality: N/A;   IR IMAGING GUIDED PORT INSERTION  03/18/2021   IR THORACENTESIS ASP PLEURAL SPACE W/IMG GUIDE  09/17/2020   IR THORACENTESIS ASP PLEURAL SPACE W/IMG GUIDE  10/25/2020   KNEE ARTHROSCOPY  1996   LEFT HEART CATH AND CORONARY ANGIOGRAPHY N/A 01/22/2021   Procedure: LEFT HEART CATH AND CORONARY ANGIOGRAPHY;  Surgeon: [name], Peter M, MD;  Location: [place] CV LAB;  Service: Cardiovascular;  Laterality: N/A;   PARTIAL COLECTOMY  2008   TEE WITHOUT CARDIOVERSION N/A 03/04/2021   Procedure: TRANSESOPHAGEAL ECHOCARDIOGRAM (TEE);  Surgeon: [name], MD;  Location: [facility] ENDOSCOPY;  Service: Cardiovascular;  Laterality: N/A;   THYROID CYST EXCISION     TONSILLECTOMY     VIDEO BRONCHOSCOPY Left 01/28/2021   Procedure: VIDEO BRONCHOSCOPY WITHOUT FLUORO;  Surgeon: [name], DO;  Location: [place];  Service: Pulmonary;  Laterality: Left;     Social History:  reports that he quit smoking about 5 years ago. His smoking use included cigarettes. He has a 75.00 pack-year smoking history. He has never used smokeless tobacco. He reports that he does not currently use alcohol. He reports that he does not use drugs.  Allergies  Allergen Reactions   Aspirin Other (See Comments)    GI bleed   Codeine Itching   Morphine Itching    Family History  Problem Relation Age of Onset   CAD Father    Alcohol abuse Father    Heart disease Father    COPD Mother    Asthma Mother    Emphysema Mother    Cancer Maternal Grandmother    Colon cancer Neg Hx      Prior to Admission medications   Medication Sig Start Date End Date Taking? Authorizing Provider  cefUROXime (CEFTIN) 250 MG tablet Take 1 tablet (250 mg total) by mouth 2 (two) times daily with a meal for 7 days. 03/29/21 04/05/21 Yes Dorie Rank, MD  apixaban (ELIQUIS) 5 MG TABS tablet Take 1 tablet (5 mg total) by mouth 2 (two) times daily.  02/16/21   Dwyane Dee, MD  atorvastatin (LIPITOR) 20 MG tablet Take 20 mg by mouth at bedtime.  05/29/12   [provider]  buPROPion (WELLBUTRIN XL) 300 MG 24 hr tablet Take 150 mg by mouth 2 (two) times daily. 05/31/12   [provider]  calcium-vitamin D (OSCAL WITH D) 250-125 MG-UNIT per tablet Take 1 tablet by mouth daily.    [provider]  Carbidopa-Levodopa ER (SINEMET CR) 25-100 MG tablet controlled release Take 1 tablet by mouth 2 (two) times daily. 03/17/21   [provider]  cetirizine (ZYRTEC) 10 MG tablet Take 10 mg by mouth 2 (two) times daily.    [provider]  diltiazem (CARDIZEM CD) 300 MG 24 hr capsule Take 1 capsule (300 mg total) by mouth daily. 02/17/21   Dwyane Dee, MD  donepezil (ARICEPT) 10 MG tablet Take 1 tablet (10 mg total) by mouth at bedtime. 05/08/20   Star Age, MD  ferrous gluconate (FERGON) 324 MG tablet TAKE 1 TABLET BY MOUTH DAILY WITH BREAKFAST 09/26/19    Nicholas Lose, MD  formoterol (PERFOROMIST) 20 MCG/2ML nebulizer solution Take 2 mLs (20 mcg total) by nebulization 2 (two) times daily. 10/01/20   Chesley Mires, MD  Nyoka Cowden Tea 315 MG CAPS Take 315 mg by mouth daily.    [provider]  HYDROcodone-acetaminophen (NORCO) 5-325 MG tablet Take 1 tablet by mouth every 6 (six) hours as needed for moderate pain. 02/26/21   Heilingoetter, Cassandra L, PA-C  ibuprofen (ADVIL) 800 MG tablet Take 1 tablet (800 mg total) by mouth every 8 (eight) hours as needed for moderate pain. 12/31/20   Chesley Mires, MD  lidocaine-prilocaine (EMLA) cream Apply 1 application topically as needed. 02/26/21   Heilingoetter, Cassandra L, PA-C  MEGARED OMEGA-3 KRILL OIL PO Take 1 capsule by mouth daily.    [provider]  metFORMIN (GLUCOPHAGE) 500 MG tablet Take 1 tablet (500 mg total) by mouth 2 (two) times daily with a meal. 01/25/21   [name], Peter M, MD  metoprolol tartrate (LOPRESSOR) 25 MG tablet Take 1 tablet (25 mg total) by mouth 2 (two) times daily. Hold if heart rate less than 60 or blood pressure top number is less than 95 02/27/21   Sande Rives E, PA-C  mirtazapine (REMERON) 15 MG tablet Take 15 mg by mouth at bedtime. 06/04/20   [provider]  modafinil (PROVIGIL) 100 MG tablet Take 1 tablet (100 mg total) by mouth daily. 01/10/21   Chesley Mires, MD  Multiple Vitamins-Minerals (MULTIVITAMIN WITH MINERALS) tablet Take 1 tablet by mouth daily. Centrum    [provider]  nitroGLYCERIN (NITROSTAT) 0.4 MG SL tablet Place 1 tablet (0.4 mg total) under the tongue every 5 (five) minutes as needed for chest pain. 10/24/20 03/14/21  [name], MD  omeprazole (PRILOSEC OTC) 20 MG tablet Take 20 mg by mouth every evening.    [provider]  OXYGEN Inhale 4-6 L into the lungs continuous.    [provider]  potassium chloride SA (KLOR-CON M) 20 MEQ tablet Take 1 tablet (20 mEq total) by mouth daily. 03/19/21    Heilingoetter, Cassandra L, PA-C  prochlorperazine (COMPAZINE) 10 MG tablet Take 1 tablet (10 mg total) by mouth every 6 (six) hours as needed for nausea or vomiting. 02/01/21   Curt Bears, MD  revefenacin [facility]) 175 MCG/3ML nebulizer solution Take 3 mLs (175 mcg total) by nebulization daily. 09/14/20   Chesley Mires, MD  sertraline (ZOLOFT) 100 MG  tablet Take 150 mg by mouth daily. 06/07/12   [provider]  traMADol (ULTRAM) 50 MG tablet Take 1 tablet (50 mg total) by mouth every 6 (six) hours as needed. Patient taking differently: Take 50 mg by mouth every 6 (six) hours as needed for moderate pain. 02/01/21   Curt Bears, MD    Physical Exam: Vitals:   03/29/21 1815 03/29/21 1955 03/29/21 2100 03/29/21 2200  BP:  114/70 121/72 119/61  Pulse: 74 75 75 76  Resp: (!) 22 (!) 24 (!) 26 20  Temp:      TempSrc:      SpO2: 100% 99% 100% 99%  Weight:      Height:       Constitutional: Chronically ill-appearing man resting supine in bed, appears fatigued but in NAD, calm, comfortable Eyes: PERRL, lids and conjunctivae normal ENMT: Mucous membranes are dry. Posterior pharynx clear of any exudate or lesions.Normal dentition.  Neck: normal, supple, no masses. Respiratory: Coarse breath sounds with rhonchi and wheezing throughout. Normal respiratory effort while on 4 L O2 via [place]. No accessory muscle use.  Cardiovascular: In atrial flutter with controlled rate, no murmurs / rubs / gallops. No extremity edema. 2+ pedal pulses. Abdomen: no tenderness, no masses palpated. No hepatosplenomegaly.  Musculoskeletal: Resting tremor left hand, no clubbing / cyanosis. No joint deformity upper and lower extremities. Good ROM, no contractures. Normal muscle tone.  Skin: no rashes, lesions, ulcers. No induration Neurologic: CN 2-12 grossly intact. Sensation intact. Strength 5/5 in all 4 while resting in bed.  Gait not assessed. Psychiatric: Normal judgment and insight. Alert and oriented x 3.  Normal mood.   EKG: Personally reviewed. Atrial flutter, 4:1 block.  Previous EKG showed sinus rhythm.  Assessment/Plan Principal Problem:   Dehydration Active Problems:   COPD with emphysema (HCC)   Chronic respiratory failure with hypoxia (HCC)   Paroxysmal atrial flutter (HCC)   Bacteriuria   Primary cancer of left lower lobe of lung (HCC)   Diabetes mellitus, type 2 (HCC)   Iron deficiency anemia   Parkinson's disease ([place])   Hyperlipidemia associated with type 2 diabetes mellitus (HCC)   Thyroid nodule   Depression   Generalized weakness   James [name] is a 76 y.o. male with medical history significant for stage IIIa non-small cell lung cancer (recently completed radiation, chemotherapy held due to functional decline), COPD, chronic respiratory failure with hypoxia on 4 L supplemental O2 via [place], paroxysmal atrial flutter on Eliquis, Parkinson's disease, T2DM, HTN, HLD, iron deficiency anemia, depression who is admitted with generalized weakness, dehydration, and frequent falls.  Assessment and Plan: * Dehydration- (present on admission) Generalized weakness/frequent falls/failure to thrive Patient with progressive generalized weakness/deconditioning multifactorial due to cancer, recent chemoradiation therapy, Parkinson's disease, and poor oral intake.  He appears dehydrated on exam. -Continue IV fluid hydration overnight -PT/OT eval -Continue fall precautions  COPD with emphysema ([place])- (present on admission) Chronic respiratory failure with hypoxia Stable on home 4 L O2 via .  Continue formoterol nebulizer and albuterol as needed.  Bacteriuria- (present on admission) Given IV ceftriaxone in the ED.  Denies any urinary symptoms.  Follow urine culture.  Paroxysmal atrial flutter ([place])- (present on admission) In atrial flutter on admission with controlled rate.  Continue diltiazem 300 mg daily, Lopressor 25 mg twice daily, and Eliquis 5 mg twice daily.  Diabetes  mellitus, type 2 ([place]) Hold metformin.  Add SSI if needed.  Primary cancer of left lower lobe of lung ([place])- (present  on admission) Follows with oncology, Dr. Curt Bears.  Recently completed partial radiation therapy, last few cycles of chemotherapy canceled due to functional decline.  Iron deficiency anemia- (present on admission) Hemoglobin stable.  Continue iron supplement.  Parkinson's disease ([place])- (present on admission) Continue Sinemet.  Hyperlipidemia associated with type 2 diabetes mellitus ([place])- (present on admission) Continue atorvastatin.  Thyroid nodule- (present on admission) 2.1 cm left thyroid nodule noted on ultrasound 02/14/2021 last admission.  Patient states he did follow with endocrinology at which point repeat thyroid levels were reassuring and further work-up was not recommended.  Depression- (present on admission) Continue Wellbutrin, mirtazapine, sertraline.  DVT prophylaxis:  apixaban (ELIQUIS) tablet 5 mg  Code Status: Full code, confirmed with patient on admission Family Communication: Discussed with patient spouse at bedside Disposition Plan: From home, dispo pending clinical progress Consults called: None Severity of Illness: The appropriate patient status for this patient is OBSERVATION. Observation status is judged to be reasonable and necessary in order to provide the required intensity of service to ensure the patient's safety. The patient's presenting symptoms, physical exam findings, and initial radiographic and laboratory data in the context of their medical condition is felt to place them at decreased risk for further clinical deterioration. Furthermore, it is anticipated that the patient will be medically stable for discharge from the hospital within 2 midnights of admission.   Zada Finders MD [facility]  If 7PM-7AM, please contact night-coverage [email or web address]  03/29/2021, 11:47 PM

## 2021-03-29 NOTE — Assessment & Plan Note (Signed)
2.1 cm left thyroid nodule noted on ultrasound 02/14/2021 last admission.  Patient states he did follow with endocrinology at which point repeat thyroid levels were reassuring and further work-up was not recommended.

## 2021-03-29 NOTE — ED Provider Notes (Addendum)
[facility] DEPT Provider Note   CSN: [id number] Arrival date & time: 03/29/21  1648     History  Chief Complaint  Patient presents with   Weakness    James [name] is a 76 y.o. male.   Weakness Associated symptoms: no fever and no shortness of breath    Patient does have a history of COPD, depression, hyperlipidemia, diverticulosis, hypertension, Parkinson's disease and lung cancer.  Prior records were reviewed and the patient was seen at the cancer center on February 1.  Patient was noted to have complaints of increasing fatigue and weakness associated with weight loss.  Patient did receive an IV infusion for dehydration.  Patient was unable to receive his last chemotherapy treatment and plan is to pause for a few weeks and to reassess.  Patient was brought into the emergency room for generalized weakness.  Patient is a very poor historian and is having difficulty telling me exactly why he is here.  Patient states his wife will be able to provide more information.  According to EMS report patient having increasing weakness and dark urine.  No known fevers.  No vomiting.  Patient states he does have intermittent diarrhea.  Couple times per day. Wife was able to provide additional history and was concerned about his increasing weakness.  He has not had any recent fevers or chills.  No vomiting.  He also has been coughing and she was concerned about him developing pneumonia as he did have that when he was in the hospital recently.  Home Medications Prior to Admission medications   Medication Sig Start Date End Date Taking? Authorizing Provider  cefUROXime (CEFTIN) 250 MG tablet Take 1 tablet (250 mg total) by mouth 2 (two) times daily with a meal for 7 days. 03/29/21 04/05/21 Yes Dorie Rank, MD  apixaban (ELIQUIS) 5 MG TABS tablet Take 1 tablet (5 mg total) by mouth 2 (two) times daily. 02/16/21   Dwyane Dee, MD  atorvastatin (LIPITOR) 20 MG tablet Take  20 mg by mouth at bedtime.  05/29/12   [provider]  buPROPion (WELLBUTRIN XL) 300 MG 24 hr tablet Take 150 mg by mouth 2 (two) times daily. 05/31/12   [provider]  calcium-vitamin D (OSCAL WITH D) 250-125 MG-UNIT per tablet Take 1 tablet by mouth daily.    [provider]  Carbidopa-Levodopa ER (SINEMET CR) 25-100 MG tablet controlled release Take 1 tablet by mouth 2 (two) times daily. 03/17/21   [provider]  cetirizine (ZYRTEC) 10 MG tablet Take 10 mg by mouth 2 (two) times daily.    [provider]  diltiazem (CARDIZEM CD) 300 MG 24 hr capsule Take 1 capsule (300 mg total) by mouth daily. 02/17/21   Dwyane Dee, MD  donepezil (ARICEPT) 10 MG tablet Take 1 tablet (10 mg total) by mouth at bedtime. 05/08/20   Star Age, MD  ferrous gluconate (FERGON) 324 MG tablet TAKE 1 TABLET BY MOUTH DAILY WITH BREAKFAST 09/26/19   Nicholas Lose, MD  formoterol (PERFOROMIST) 20 MCG/2ML nebulizer solution Take 2 mLs (20 mcg total) by nebulization 2 (two) times daily. 10/01/20   Chesley Mires, MD  Nyoka Cowden Tea 315 MG CAPS Take 315 mg by mouth daily.    [provider]  HYDROcodone-acetaminophen (NORCO) 5-325 MG tablet Take 1 tablet by mouth every 6 (six) hours as needed for moderate pain. 02/26/21   Heilingoetter, Cassandra L, PA-C  ibuprofen (ADVIL) 800 MG tablet Take 1 tablet (800 mg total)  by mouth every 8 (eight) hours as needed for moderate pain. 12/31/20   Chesley Mires, MD  lidocaine-prilocaine (EMLA) cream Apply 1 application topically as needed. 02/26/21   Heilingoetter, Cassandra L, PA-C  MEGARED OMEGA-3 KRILL OIL PO Take 1 capsule by mouth daily.    [provider]  metFORMIN (GLUCOPHAGE) 500 MG tablet Take 1 tablet (500 mg total) by mouth 2 (two) times daily with a meal. 01/25/21   [name], Peter M, MD  metoprolol tartrate (LOPRESSOR) 25 MG tablet Take 1 tablet (25 mg total) by mouth 2 (two) times daily. Hold if heart rate less than 60 or blood  pressure top number is less than 95 02/27/21   Sande Rives E, PA-C  mirtazapine (REMERON) 15 MG tablet Take 15 mg by mouth at bedtime. 06/04/20   [provider]  modafinil (PROVIGIL) 100 MG tablet Take 1 tablet (100 mg total) by mouth daily. 01/10/21   Chesley Mires, MD  Multiple Vitamins-Minerals (MULTIVITAMIN WITH MINERALS) tablet Take 1 tablet by mouth daily. Centrum    [provider]  nitroGLYCERIN (NITROSTAT) 0.4 MG SL tablet Place 1 tablet (0.4 mg total) under the tongue every 5 (five) minutes as needed for chest pain. 10/24/20 03/14/21  [name], MD  omeprazole (PRILOSEC OTC) 20 MG tablet Take 20 mg by mouth every evening.    [provider]  OXYGEN Inhale 4-6 L into the lungs continuous.    [provider]  potassium chloride SA (KLOR-CON M) 20 MEQ tablet Take 1 tablet (20 mEq total) by mouth daily. 03/19/21   Heilingoetter, Cassandra L, PA-C  prochlorperazine (COMPAZINE) 10 MG tablet Take 1 tablet (10 mg total) by mouth every 6 (six) hours as needed for nausea or vomiting. 02/01/21   Curt Bears, MD  revefenacin [facility]) 175 MCG/3ML nebulizer solution Take 3 mLs (175 mcg total) by nebulization daily. 09/14/20   Chesley Mires, MD  sertraline (ZOLOFT) 100 MG tablet Take 150 mg by mouth daily. 06/07/12   [provider]  traMADol (ULTRAM) 50 MG tablet Take 1 tablet (50 mg total) by mouth every 6 (six) hours as needed. Patient taking differently: Take 50 mg by mouth every 6 (six) hours as needed for moderate pain. 02/01/21   Curt Bears, MD      Allergies    Aspirin, Codeine, and Morphine    Review of Systems   Review of Systems  Constitutional:  Negative for fever.  Respiratory:  Negative for shortness of breath.   Neurological:  Positive for weakness.   Physical Exam Updated Vital Signs BP 121/72    Pulse 75    Temp 97.7 F (36.5 C) (Oral)    Resp (!) 26    Ht 1.854 m (6\' 1" )    Wt 84.5 kg    SpO2 100%    BMI 24.57 kg/m   Physical Exam Vitals and nursing note reviewed.  Constitutional:      Appearance: He is well-developed. He is ill-appearing.  HENT:     Head: Normocephalic and atraumatic.     Right Ear: External ear normal.     Left Ear: External ear normal.  Eyes:     General: No scleral icterus.       Right eye: No discharge.        Left eye: No discharge.     Conjunctiva/sclera: Conjunctivae normal.  Neck:     Trachea: No tracheal deviation.  Cardiovascular:     Rate and Rhythm: Normal rate and regular rhythm.  Pulmonary:     Effort: Pulmonary effort is normal. No respiratory distress.     Breath sounds: Normal breath sounds. No stridor. No wheezing or rales.  Abdominal:     General: Bowel sounds are normal. There is no distension.     Palpations: Abdomen is soft.     Tenderness: There is no abdominal tenderness. There is no guarding or rebound.  Musculoskeletal:        General: No tenderness or deformity.     Cervical back: Neck supple.  Skin:    General: Skin is warm and dry.     Findings: No rash.  Neurological:     General: No focal deficit present.     Mental Status: He is alert.     Cranial Nerves: No cranial nerve deficit (no facial droop, extraocular movements intact, no slurred speech).     Sensory: No sensory deficit.     Motor: Weakness present. No abnormal muscle tone or seizure activity.     Coordination: Coordination normal.     Comments: Appears confused, unable to tell me why he is here, states he was at an acute care and was sent to the ED, he did not call EMS, able to lift arms and legs but with increased effort  Psychiatric:        Mood and Affect: Mood normal.    ED Results / Procedures / Treatments   Labs (all labs ordered are listed, but only abnormal results are displayed) Labs Reviewed  PROTIME-INR - Abnormal; Notable for the following components:      Result Value   Prothrombin Time 21.0 (*)    INR 1.8 (*)    All other components within normal limits   CBC - Abnormal; Notable for the following components:   RBC 3.77 (*)    Hemoglobin 9.4 (*)    HCT 31.2 (*)    MCH 24.9 (*)    RDW 30.8 (*)    All other components within normal limits  DIFFERENTIAL - Abnormal; Notable for the following components:   Lymphs Abs 0.3 (*)    All other components within normal limits  COMPREHENSIVE METABOLIC PANEL - Abnormal; Notable for the following components:   Potassium 3.2 (*)    Calcium 8.7 (*)    Total Protein 5.9 (*)    Albumin 2.7 (*)    AST 13 (*)    All other components within normal limits  URINALYSIS, ROUTINE W REFLEX MICROSCOPIC - Abnormal; Notable for the following components:   Color, Urine YELLOW (*)    APPearance CLEAR (*)    Specific Gravity, Urine >1.030 (*)    Hgb urine dipstick LARGE (*)    Protein, ur TRACE (*)    Leukocytes,Ua TRACE (*)    RBC / HPF >50 (*)    Bacteria, UA MANY (*)    All other components within normal limits  BLOOD GAS, VENOUS - Abnormal; Notable for the following components:   pO2, Ven <31.0 (*)    Bicarbonate 28.5 (*)    Acid-Base Excess 2.6 (*)    All other components within normal limits  I-STAT CHEM 8, ED - Abnormal; Notable for the following components:   Hemoglobin 9.5 (*)    HCT 28.0 (*)    All other components within normal limits  RESP PANEL BY RT-PCR (FLU A&B, COVID) ARPGX2  URINE CULTURE  ETHANOL  APTT  AMMONIA    EKG EKG Interpretation  Date/Time:  Friday March 29 2021 17:20:27 EST Ventricular Rate:  73 PR Interval:    QRS Duration: 101 QT Interval:  490 QTC Calculation: 540 R Axis:   39 Text Interpretation: Atrial flutter with predominant 4:1 AV block Low voltage, extremity leads atrial flutter is new since last tracing Confirmed by Dorie Rank [phone]) on 03/29/2021 5:36:12 PM  Radiology CT HEAD WO CONTRAST  Result Date: 03/29/2021 CLINICAL DATA:  Altered mental status EXAM: CT HEAD WITHOUT CONTRAST TECHNIQUE: Contiguous axial images were obtained from the base of the skull  through the vertex without intravenous contrast. RADIATION DOSE REDUCTION: This exam was performed according to the departmental dose-optimization program which includes automated exposure control, adjustment of the mA and/or kV according to patient size and/or use of iterative reconstruction technique. COMPARISON:  MRI 02/01/2021, CT 06/17/2019 FINDINGS: Brain: Normal anatomic configuration. Parenchymal volume loss is commensurate with the patient's age. Mild periventricular white matter changes are present likely reflecting the sequela of small vessel ischemia. No abnormal intra or extra-axial mass lesion or fluid collection. No abnormal mass effect or midline shift. No evidence of acute intracranial hemorrhage or infarct. Mild ventriculomegaly is stable and appears commensurate with the degree of parenchymal volume loss. Cerebellum unremarkable. Vascular: Prominence of the M1 segment of the left MCA is stable and unchanged from prior MRI examination. Otherwise, no asymmetric hyperdense vasculature at the skull base. Skull: Intact Sinuses/Orbits: Paranasal sinuses are clear. Ocular lenses have been removed. Orbits are otherwise unremarkable. Other: Mastoid air cells and middle ear cavities are clear. IMPRESSION: No acute intracranial abnormality. Stable senescent change.  Stable mild ventriculomegaly. Electronically Signed   By: Fidela Salisbury M.D.   On: 03/29/2021 19:02   DG Chest Portable 1 View  Result Date: 03/29/2021 CLINICAL DATA:  Generalized weakness. EXAM: PORTABLE CHEST 1 VIEW COMPARISON:  Chest x-rays dated 02/18/2021 and 02/11/2021. Chest CT dated 11/17/2020. PET-CT dated 12/26/2020. FINDINGS: Heart size and mediastinal contours are grossly stable. 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 RIGHT lung is  clear. RIGHT chest wall Port-A-Cath in place with tip adequately positioned at the level of the mid/lower SVC. Osseous structures about the chest are unremarkable. IMPRESSION: 1. No significant change compared to the chest x-ray of 02/18/2021. Again noted is a LEFT pleural effusion and airspace opacities throughout the LEFT lung with volume loss, compatible with the postobstructive changes related to the LEFT perihilar mass as demonstrated on earlier chest CT and PET-CT. 2. No new findings seen.  RIGHT lung is clear. Electronically Signed   By: Franki Cabot M.D.   On: 03/29/2021 18:37    Procedures Procedures    Medications Ordered in ED Medications  sodium chloride 0.9 % bolus 1,000 mL (1,000 mLs Intravenous New Bag/Given 03/29/21 1805)    Followed by  0.9 %  sodium chloride infusion (100 mL/hr Intravenous New Bag/Given 03/29/21 2108)  cefTRIAXone (ROCEPHIN) 1 g in sodium chloride 0.9 % 100 mL IVPB (1 g Intravenous New Bag/Given 03/29/21 2151)  potassium chloride SA (KLOR-CON M) CR tablet 20 mEq (20 mEq Oral Given 03/29/21 1836)    ED Course/ Medical Decision Making/ A&P Clinical Course as of 03/29/21 2156  Fri Mar 29, 2021  1735 Blood gas, venous (at [facility] and [facility], not at [facility])(!!) No signs of acidosis or hypercarbia [JK]  1827 Ethanol Normal [JK]  1827 CBC(!) Anemia stable [JK]  1827 Comprehensive metabolic panelMarland Kitchen)  Mild hypokalemia [JK]  1944 COVID and flu are negative.  Ammonia is normal [JK]  1944 Head CT without acute abnormalities. [JK]  1945 Chest x-ray images and radiology report reviewed.  No acute findings [JK]  2112 Urinalysis does show many bacteria 11-20 whites greater than 50 red blood cells. [JK]  2123  Patient is feeling better after treatment in the ED.  He is eating and drinking without difficulty. [JK]    Clinical Course User Index [JK] Dorie Rank, MD                           Medical Decision Making Amount and/or Complexity of Data Reviewed Labs: ordered.  Decision-making details documented in ED Course. Radiology: ordered.  Risk Prescription drug management.   Patient presents to the ED for evaluation of generalized weakness.  Patient has complex history including known lung cancer.  He has become weaker with his chemotherapy.  ED work-up overall is reassuring.  No signs of electrolyte abnormalities.  No anemia.  No signs of severe dehydration.  Chest x-ray does not show pneumonia.  COVID and flu are negative no signs of metastatic disease on head CT.  Patient was given IV fluids.  He is feeling better now.  He has been able to eat and drink.  Urinalysis does suggest the possibility of urinary tract infection.  Discussed these findings with the patient and his wife.  Patient initially wanted to go home but the wife was concerned.  Patient has not been able to stand and walk without falling.  She is not strong enough to try to help him up.  They had to call EMS to bring him to the hospital today.  Patient's weakness is likely multifactorial.  I will consult with medical service for admission  May end up needing assistance at home ultimately.   Final Clinical Impression(s) / ED Diagnoses Final diagnoses:  Acute cystitis without hematuria  Malignant neoplasm of lung, unspecified laterality, unspecified part of lung ([place])  Weakness     Dorie Rank, MD 03/29/21 2158

## 2021-03-29 NOTE — Assessment & Plan Note (Signed)
Follows with oncology, Dr. Curt Bears.  Recently completed partial radiation therapy, last few cycles of chemotherapy canceled due to functional decline.

## 2021-03-29 NOTE — Assessment & Plan Note (Signed)
In atrial flutter on admission with controlled rate.  Continue diltiazem 300 mg daily, Lopressor 25 mg twice daily, and Eliquis 5 mg twice daily.

## 2021-03-29 NOTE — Assessment & Plan Note (Signed)
Hold metformin.  Add SSI if needed.

## 2021-03-29 NOTE — Assessment & Plan Note (Signed)
Chronic respiratory failure with hypoxia Stable on home 4 L O2 via [place].  Continue formoterol nebulizer and albuterol as needed.

## 2021-03-29 NOTE — Telephone Encounter (Signed)
Pts wife called stating the pt is very weak, has had multiple falls, pulse is fluctuating between 57 and 75. His O2 is 82% with no oxygen and when she placed him on 4L of O2, it increased to 92%. She describes his urine as "chocolate colored" and hears a "strong rattle sound in his chest". Lastly, she states the pts feet are swollen and she is unable to move him or help him to the car to go to the ER because he continuously falls.  Pts wife have been advised to call EMS to have the pt taken to the hospital for evaluation. She agrees with this plan and states she will call them now.

## 2021-03-29 NOTE — ED Notes (Signed)
Pt was placed on a purwick.

## 2021-03-30 DIAGNOSIS — R8271 Bacteriuria: Secondary | ICD-10-CM | POA: Diagnosis not present

## 2021-03-30 DIAGNOSIS — N3000 Acute cystitis without hematuria: Secondary | ICD-10-CM

## 2021-03-30 DIAGNOSIS — J439 Emphysema, unspecified: Secondary | ICD-10-CM | POA: Diagnosis not present

## 2021-03-30 DIAGNOSIS — R531 Weakness: Secondary | ICD-10-CM

## 2021-03-30 DIAGNOSIS — E860 Dehydration: Secondary | ICD-10-CM | POA: Diagnosis not present

## 2021-03-30 LAB — BASIC METABOLIC PANEL
Anion gap: 10 (ref 5–15)
BUN: 11 mg/dL (ref 8–23)
CO2: 25 mmol/L (ref 22–32)
Calcium: 8 mg/dL — ABNORMAL LOW (ref 8.9–10.3)
Chloride: 100 mmol/L (ref 98–111)
Creatinine, Ser: 0.69 mg/dL (ref 0.61–1.24)
GFR, Estimated: >60 mL/min (ref >60)
Glucose, Bld: 91 mg/dL (ref 70–99)
Potassium: 3.2 mmol/L — ABNORMAL LOW (ref 3.5–5.1)
Sodium: 135 mmol/L (ref 135–145)

## 2021-03-30 LAB — CBC
HCT: 30.1 % — ABNORMAL LOW (ref 39.0–52.0)
Hemoglobin: 9.1 g/dL — ABNORMAL LOW (ref 13.0–17.0)
MCH: 25.1 pg — ABNORMAL LOW (ref 26.0–34.0)
MCHC: 30.2 g/dL (ref 30.0–36.0)
MCV: 82.9 fL (ref 80.0–100.0)
Platelets: 172 10*3/uL (ref 150–400)
RBC: 3.63 MIL/uL — ABNORMAL LOW (ref 4.22–5.81)
RDW: 30.7 % — ABNORMAL HIGH (ref 11.5–15.5)
WBC: 5.7 10*3/uL (ref 4.0–10.5)
nRBC: 0 % (ref 0.0–0.2)

## 2021-03-30 LAB — MAGNESIUM: Magnesium: 1.3 mg/dL — ABNORMAL LOW (ref 1.7–2.4)

## 2021-03-30 MED ORDER — POTASSIUM CHLORIDE CRYS ER 20 MEQ PO TBCR
40.0000 meq | EXTENDED_RELEASE_TABLET | ORAL | Status: AC
  Administered 2021-03-30 (×2): 40 meq via ORAL
  Filled 2021-03-30 (×2): qty 2

## 2021-03-30 MED ORDER — SODIUM CHLORIDE 0.9 % IV SOLN: INTRAVENOUS | Status: DC | PRN

## 2021-03-30 MED ORDER — PANTOPRAZOLE SODIUM 40 MG PO TBEC
40.0000 mg | DELAYED_RELEASE_TABLET | Freq: Every day | ORAL | Status: DC
  Administered 2021-03-31 – 2021-04-02 (×3): 40 mg via ORAL
  Filled 2021-03-30 (×4): qty 1

## 2021-03-30 MED ORDER — MAGNESIUM SULFATE 4 GM/100ML IV SOLN
4.0000 g | Freq: Once | INTRAVENOUS | Status: AC
  Administered 2021-03-30: 4 g via INTRAVENOUS
  Filled 2021-03-30: qty 100

## 2021-03-30 NOTE — Evaluation (Signed)
Physical Therapy Evaluation Patient Details Name: James [name] MRN: [id number] DOB: 09-06-1945 Today's Date: 03/30/2021  History of Present Illness  James [name] is a 76 y.o. male with medical history significant for stage IIIa non-small cell lung cancer (recently completed radiation, chemotherapy held due to functional decline), COPD, chronic respiratory failure with hypoxia on 4 L supplemental O2 via , paroxysmal atrial flutter on Eliquis, Parkinson's disease, T2DM, HTN, HLD, iron deficiency anemia, depression who presented to the ED for evaluation of generalized weakness.  Clinical Impression  The patient is resting on stretcher, frequent productive coughing. Patient is very deconditioned. Patient has had multiple falls recently. Wife unable to assist at this time. Per wife,  hopes for rehab to improve srength and safety. Pt admitted with above diagnosis.  Pt currently with functional limitations due to the deficits listed below (see PT Problem List). Pt will benefit from skilled PT to increase their independence and safety with mobility to allow discharge to the venue listed below.   Patient reports feeling dizzy  when standing. : BP 120/76 supine and 110/62 standing. SPO2 on 4 L > 905.         Recommendations for follow up therapy are one component of a multi-disciplinary discharge planning process, led by the attending physician.  Recommendations may be updated based on patient status, additional functional criteria and insurance authorization.  Follow Up Recommendations Skilled nursing-short term rehab (<3 hours/day)    Assistance Recommended at Discharge    Patient can return home with the following  A lot of help with bathing/dressing/bathroom;Two people to help with walking and/or transfers;Assistance with cooking/housework;Assist for transportation;Help with stairs or ramp for entrance    Equipment Recommendations None recommended by PT  Recommendations for Other  Services       Functional Status Assessment Patient has had a recent decline in their functional status and demonstrates the ability to make significant improvements in function in a reasonable and predictable amount of time.     Precautions / Restrictions Precautions Precautions: Fall Precaution Comments: on 4 L at home      Mobility  Bed Mobility   Bed Mobility: Supine to Sit, Sit to Supine, Rolling Rolling: Mod assist   Supine to sit: Mod assist Sit to supine: Max assist   General bed mobility comments: cues to  flex legs to roll.  Mod assist  to sit upright, then assistr with legs and trunk to reurn to side then back.    Transfers Overall transfer level: Needs assistance Equipment used: Rolling walker (2 wheels) Transfers: Sit to/from Stand Sit to Stand: Mod assist           General transfer comment: cues for safety and hand placement.    Ambulation/Gait Ambulation/Gait assistance: Mod assist, +2 physical assistance, +2 safety/equipment   Assistive device: Rolling walker (2 wheels) Gait Pattern/deviations: Step-to pattern       General Gait Details: side steps along the bed x 4.  Stairs            Wheelchair Mobility    Modified Rankin (Stroke Patients Only)       Balance Overall balance assessment: Needs assistance, History of Falls Sitting-balance support: Bilateral upper extremity supported, Feet supported Sitting balance-Leahy Scale: Fair     Standing balance support: Bilateral upper extremity supported, Reliant on assistive device for balance, During functional activity Standing balance-Leahy Scale: Poor  Pertinent Vitals/Pain Pain Assessment Pain Assessment: Faces Pain Score: 5  Faces Pain Scale: Hurts little more Pain Location: right  and middle of chest and Pain Intervention(s): Limited activity within patient's tolerance    Home Living Family/patient expects to be discharged to::  Private residence Living Arrangements: Spouse/significant other Available Help at Discharge: Available 24 hours/day Type of Home: House Home Access: Stairs to enter Entrance Stairs-Rails: None Entrance Stairs-Number of Steps: 3 Alternate Level Stairs-Number of Steps: flight with landing Home Layout: Two level;Bed/bath upstairs Home Equipment: Conservation officer, nature (2 wheels) Additional Comments: wife came in and states  multiple falls , almost daily, and she cannot provide assistance needed.    Prior Function Prior Level of Function : Needs assist  Cognitive Assist : Mobility (cognitive) Mobility (Cognitive): Set up cues   Physical Assist : ADLs (physical);Mobility (physical) Mobility (physical): Bed mobility;Transfers;Gait ADLs (physical): Dressing;IADLs         Hand Dominance   Dominant Hand: Right    Extremity/Trunk Assessment   Upper Extremity Assessment Upper Extremity Assessment: Defer to OT evaluation    Lower Extremity Assessment Lower Extremity Assessment: Generalized weakness    Cervical / Trunk Assessment Cervical / Trunk Assessment: Kyphotic  Communication   Communication: No difficulties  Cognition Arousal/Alertness: Awake/alert Behavior During Therapy: WFL for tasks assessed/performed Overall Cognitive Status: Within Functional Limits for tasks assessed                                          General Comments      Exercises     Assessment/Plan    PT Assessment Patient needs continued PT services  PT Problem List Decreased strength;Decreased balance;Decreased knowledge of precautions;Decreased range of motion;Decreased mobility;Decreased knowledge of use of DME;Decreased activity tolerance;Decreased coordination;Decreased safety awareness       PT Treatment Interventions DME instruction;Therapeutic activities;Gait training;Therapeutic exercise;Patient/family education;Functional mobility training;Balance training    PT Goals  (Current goals can be found in the Care Plan section)  Acute Rehab PT Goals Patient Stated Goal: to go to rehab to get stronger. PT Goal Formulation: With patient/family Time For Goal Achievement: 04/13/21 Potential to Achieve Goals: Fair    Frequency Min 2X/week     Co-evaluation               AM-PAC PT "6 Clicks" Mobility  Outcome Measure Help needed turning from your back to your side while in a flat bed without using bedrails?: A Lot Help needed moving from lying on your back to sitting on the side of a flat bed without using bedrails?: A Lot Help needed moving to and from a bed to a chair (including a wheelchair)?: A Lot Help needed standing up from a chair using your arms (e.g., wheelchair or bedside chair)?: Total Help needed to walk in hospital room?: Total Help needed climbing 3-5 steps with a railing? : Total 6 Click Score: 9    End of Session Equipment Utilized During Treatment: Gait belt Activity Tolerance: Patient limited by fatigue Patient left: in bed;with call bell/phone within reach;with family/visitor present Nurse Communication: Mobility status PT Visit Diagnosis: Muscle weakness (generalized) (M62.81);Difficulty in walking, not elsewhere classified (R26.2);Other symptoms and signs involving the nervous system (R29.898);Dizziness and giddiness (R42)    Time: 5621-3086 PT Time Calculation (min) (ACUTE ONLY): 24 min   Charges:   PT Evaluation $PT Eval Low Complexity: 1 Low  [place] Pager [phone] Office [phone]   Claretha Cooper 03/30/2021, 3:15 PM

## 2021-03-30 NOTE — Plan of Care (Signed)
  Problem: Education: Goal: Knowledge of General Education information will improve Description: Including pain rating scale, medication(s)/side effects and non-pharmacologic comfort measures Outcome: Progressing   Problem: Activity: Goal: Risk for activity intolerance will decrease Outcome: Progressing   Problem: Nutrition: Goal: Adequate nutrition will be maintained Outcome: Progressing   Problem: Coping: Goal: Level of anxiety will decrease Outcome: Progressing   

## 2021-03-30 NOTE — Progress Notes (Signed)
[facility]  PROGRESS NOTE  James [name] ELF:[id number] DOB: 1945/06/11 DOA: 03/29/2021 PCP: [name], MD   Brief HPI:   76 year old male with medical history of stage IIIa non-small cell lung cancer, recently completed radiation, chemotherapy held due to functional decline, COPD, chronic respiratory failure with hypoxia on 4 L supplemental oxygen via nasal cannula at home, paroxysmal atrial flutter on Eliquis, Parkinson's disease, diabetes mellitus type 2, hypertension, hyperlipidemia, iron deficiency anemia, depression who presented to ED with complaints of generalized weakness.  Patient reported 1 month of progressive generalized weakness with frequent falls.  Also reported chronic diarrhea with chronic shortness of breath with cough productive of clear phlegm.  In the ED UA showed more than 50 RBC per high-power field, 11-20 WBC/hpf, SARS-CoV-2 and influenza PCR negative  Portable chest x-ray shows unchanged left pleural effusion and airspace opacities throughout the left lung with volume loss compatible with postobstructive changes related to the left perihilar mass demonstrated on prior chest CT and PET/CT.  No new findings seen.   CT head without contrast negative for acute intracranial abnormality.  Stable senescent changes and stable mild ventriculomegaly noted.    Subjective   Patient seen and examined, denies shortness of breath.  Feels better than yesterday.   Assessment/Plan:    Generalized weakness/failure to thrive -Presented with generalized weakness due to cancer, recent chemoradiation therapy -Also has Parkinson disease causing recurrent falls -Poor p.o. intake -Started on IV fluids -PT/OT consult -Fall precautions  COPD/chronic respiratory failure -Patient on 4 L/min of oxygen via [place] -Continue albuterol nebulizer and as needed albuterol  Bacteriuria -UA shows many bacteria -Urine culture pending -He was given ceftriaxone in the  ED  Hypokalemia/hypomagnesemia -Replace potassium and magnesium -Follow labs in a.m.  Paroxysmal atrial flutter -Continue diltiazem 300 mg p.o. daily, Lopressor 25 mg twice daily -Continue anticoagulation with Eliquis  Diabetes mellitus type 2 -Metformin on hold -Continue sliding scale insulin NovoLog  Primary cancer of left lower lung -Follows oncology Dr. Julien Nordmann -Recently completed partial radiation therapy -Last 2 cycles of chemotherapy canceled due to functional decline  Iron-deficiency anemia -Stable  Parkinson disease -Continue Sinemet  Thyroid nodule -2.1 cm nodule on thyroid shown on ultrasound 12/22 last admission -Patient says that he is followed with endocrinology at that point repeat thyroid levels were reassuring -Further work-up was not recommended  Depression -Continue Wellbutrin, mirtazapine, sertraline        Medications     apixaban  5 mg Oral BID   arformoterol  15 mcg Nebulization Q12H   atorvastatin  20 mg Oral QHS   buPROPion  150 mg Oral BID   Carbidopa-Levodopa ER  1 tablet Oral BID   diltiazem  300 mg Oral Daily   donepezil  10 mg Oral QHS   ferrous gluconate  324 mg Oral Q breakfast   metoprolol tartrate  25 mg Oral BID   mirtazapine  15 mg Oral QHS   modafinil  100 mg Oral Daily   pantoprazole  40 mg Oral QAC supper   sertraline  150 mg Oral Daily   sodium chloride flush  3 mL Intravenous Q12H     Data Reviewed:   CBG:  No results for input(s): GLUCAP in the last 168 hours.  SpO2: 97 %    Vitals:   03/30/21 0950 03/30/21 1200 03/30/21 1400 03/30/21 1500  BP: (!) 130/57 112/71 125/72 103/70  Pulse: (!) 114 77 64 78  Resp: (!) 26 (!) 22 (!) 24 20  Temp:  TempSrc:      SpO2: 98% 99% 92% 97%  Weight:      Height:          Data Reviewed:  Basic Metabolic Panel: Recent Labs  Lab 03/27/21 0915 03/29/21 1718 03/29/21 1805 03/30/21 0530  NA 142 139 143 135  K 3.2* 3.2* 3.5 3.2*  CL 105 105 103 100  CO2  27 27  --  25  GLUCOSE 180* 97 89 91  BUN 17 16 13 11   CREATININE 0.94 0.85 0.70 0.69  CALCIUM 9.4 8.7*  --  8.0*  MG  --   --   --  1.3*    CBC: Recent Labs  Lab 03/27/21 0915 03/29/21 1718 03/29/21 1805 03/30/21 0530  WBC 5.2 7.5  --  5.7  NEUTROABS 4.1 6.3  --   --   HGB 9.8* 9.4* 9.5* 9.1*  HCT 31.2* 31.2* 28.0* 30.1*  MCV 79.0* 82.8  --  82.9  PLT 223 209  --  172       Antibiotics: Anti-infectives (From admission, onward)    Start     Dose/Rate Route Frequency Ordered Stop   03/30/21 2100  cefTRIAXone (ROCEPHIN) 1 g in sodium chloride 0.9 % 100 mL IVPB  Status:  Discontinued        1 g 200 mL/hr over 30 Minutes Intravenous Every 24 hours 03/29/21 2330 03/29/21 2341   03/29/21 2130  cefTRIAXone (ROCEPHIN) 1 g in sodium chloride 0.9 % 100 mL IVPB        1 g 200 mL/hr over 30 Minutes Intravenous  Once 03/29/21 2120 03/29/21 2221   03/29/21 0000  cefUROXime (CEFTIN) 250 MG tablet        250 mg Oral 2 times daily with meals 03/29/21 2126 04/05/21 2359        DVT prophylaxis: Apixaban  Code Status: Full code  Family Communication: No family at bedside      Objective    Physical Examination:   General-appears in no acute distress Heart-S1-S2, regular, no murmur auscultated Lungs-clear to auscultation bilaterally, no wheezing or crackles auscultated Abdomen-soft, nontender, no organomegaly Extremities-no edema in the lower extremities Neuro-alert, oriented x3, no focal deficit noted  Status is: Inpatient failure to thrive        [place]   [facility] If 7PM-7AM, please contact night-coverage at [email or web address], Office  [phone]   03/30/2021, 3:30 PM  LOS: 0 days

## 2021-03-30 NOTE — Evaluation (Signed)
Occupational Therapy Evaluation Patient Details Name: Surafel Hilleary MRN: [id number] DOB: 1945-12-03 Today's Date: 03/30/2021   History of Present Illness Ascher Schroepfer is a 76 y.o. male with medical history significant for stage IIIa non-small cell lung cancer (recently completed radiation, chemotherapy held due to functional decline), COPD, chronic respiratory failure with hypoxia on 4 L supplemental O2 via [place], paroxysmal atrial flutter on Eliquis, Parkinson's disease, T2DM, HTN, HLD, iron deficiency anemia, depression who presented to the ED for evaluation of generalized weakness.   Clinical Impression   Patient is a 75 year old male who was living at home with wife support prior level. Currently, patient is mod A +2 for transfers with RW with dizziness reported with transitions. Patient was noted to have decreased activity tolerance, increased pain, decreased endurance, decreased standing balance, increased dizziness with movement, decreased safety awareness impacting participation in ADLs. Patient would continue to benefit from skilled OT services at this time while admitted and after d/c to address noted deficits in order to improve overall safety and independence in ADLs.     BP supine in bed 98/62mmhg,120/76mmhg sitting, and 110/45mmhg  standing.     Recommendations for follow up therapy are one component of a multi-disciplinary discharge planning process, led by the attending physician.  Recommendations may be updated based on patient status, additional functional criteria and insurance authorization.   Follow Up Recommendations  Skilled nursing-short term rehab (<3 hours/day)    Assistance Recommended at Discharge Frequent or constant Supervision/Assistance  Patient can return home with the following Two people to help with walking and/or transfers;Two people to help with bathing/dressing/bathroom;Direct supervision/assist for medications management;Help with stairs or ramp for  entrance;Assist for transportation;Direct supervision/assist for financial management;Assistance with cooking/housework    Functional Status Assessment  Patient has had a recent decline in their functional status and demonstrates the ability to make significant improvements in function in a reasonable and predictable amount of time.  Equipment Recommendations  None recommended by OT    Recommendations for Other Services       Precautions / Restrictions Precautions Precautions: Fall Precaution Comments: on 4 L at home, L eye blind Restrictions Weight Bearing Restrictions: No      Mobility Bed Mobility   Bed Mobility: Supine to Sit, Sit to Supine, Rolling Rolling: Mod assist   Supine to sit: Mod assist Sit to supine: Max assist   General bed mobility comments: cues to  flex legs to roll.  Mod assist  to sit upright, then assistr with legs and trunk to reurn to side then back.    Transfers                          Balance Overall balance assessment: Needs assistance, History of Falls Sitting-balance support: Bilateral upper extremity supported, Feet supported Sitting balance-Leahy Scale: Fair     Standing balance support: Bilateral upper extremity supported, Reliant on assistive device for balance, During functional activity Standing balance-Leahy Scale: Poor                             ADL either performed or assessed with clinical judgement   ADL Overall ADL's : Needs assistance/impaired Eating/Feeding: Minimal assistance;Sitting   Grooming: Wash/dry face;Wash/dry hands;Sitting;Minimal assistance   Upper Body Bathing: Moderate assistance;Sitting   Lower Body Bathing: Bed level;Maximal assistance   Upper Body Dressing : Sitting;Moderate assistance   Lower Body Dressing: Bed level;Maximal assistance  Toilet Transfer: +2 for safety/equipment;+2 for physical assistance;Moderate assistance Toilet Transfer Details (indicate cue type and  reason): patient reported onset of dizziness with sit to stand off stretcher in ED. patient was able to take small steps to [facility] with increased time with RW. Toileting- Clothing Manipulation and Hygiene: Maximal assistance;Sit to/from stand       Functional mobility during ADLs: +2 for safety/equipment;Moderate assistance       Vision Baseline Vision/History: 1 Wears glasses (L eye blind) Patient Visual Report: No change from baseline       Perception     Praxis      Pertinent Vitals/Pain Pain Assessment Pain Assessment: Faces Faces Pain Scale: Hurts little more Pain Location: right  and middle of chest and Pain Descriptors / Indicators: Discomfort, Grimacing, Guarding Pain Intervention(s): Limited activity within patient's tolerance     Hand Dominance Right   Extremity/Trunk Assessment Upper Extremity Assessment Upper Extremity Assessment: Generalized weakness (patient was able to range both shoulders above 90 degrees MMT not tested as patient reported pain in R side of ribs s/p fall prior to hospital admission)   Lower Extremity Assessment Lower Extremity Assessment: Defer to PT evaluation   Cervical / Trunk Assessment Cervical / Trunk Assessment: Kyphotic   Communication Communication Communication: No difficulties   Cognition Arousal/Alertness: Awake/alert Behavior During Therapy: WFL for tasks assessed/performed Overall Cognitive Status: Within Functional Limits for tasks assessed                                       General Comments  patients wife was tearful during end of session reporting she was unable to pick pt up off floor with frequent falls at home.    Exercises     Shoulder Instructions      Home Living Family/patient expects to be discharged to:: Private residence Living Arrangements: Spouse/significant other Available Help at Discharge: Available 24 hours/day Type of Home: House Home Access: Stairs to enter [employer] of Steps: 3 Entrance Stairs-Rails: None Home Layout: Two level;Bed/bath upstairs Alternate Level Stairs-Number of Steps: flight with landing Alternate Level Stairs-Rails: Right           Home Equipment: Rolling Walker (2 wheels)   Additional Comments: wife came in and states  multiple falls , almost daily, and she cannot provide assistance needed.      Prior Functioning/Environment Prior Level of Function : Needs assist  Cognitive Assist : Mobility (cognitive) Mobility (Cognitive): Set up cues   Physical Assist : ADLs (physical);Mobility (physical) Mobility (physical): Bed mobility;Transfers;Gait ADLs (physical): Dressing;IADLs Mobility Comments: safety, tends to  get up when cued not to ADLs Comments: asssit from wife PRN due to [place]        OT Problem List: Impaired balance (sitting and/or standing);Decreased safety awareness;Impaired UE functional use;Decreased knowledge of precautions;Decreased knowledge of use of DME or AE;Cardiopulmonary status limiting activity;Decreased activity tolerance;Pain      OT Treatment/Interventions: Self-care/ADL training;Therapeutic exercise;Neuromuscular education;Energy conservation;DME and/or AE instruction;Therapeutic activities;Splinting;Balance training;Patient/family education    OT Goals(Current goals can be found in the care plan section) Acute Rehab OT Goals Patient Stated Goal: to go home OT Goal Formulation: With patient/family Time For Goal Achievement: 04/13/21 Potential to Achieve Goals: Good  OT Frequency: Min 2X/week    Co-evaluation              AM-PAC OT "6 Clicks" Daily Activity     Outcome Measure  Help from another person eating meals?: A Little Help from another person taking care of personal grooming?: A Little Help from another person toileting, which includes using toliet, bedpan, or urinal?: A Lot Help from another person bathing (including washing, rinsing, drying)?: A Lot Help from  another person to put on and taking off regular upper body clothing?: A Lot Help from another person to put on and taking off regular lower body clothing?: A Lot 6 Click Score: 14   End of Session Equipment Utilized During Treatment: Gait belt;Rolling walker (2 wheels) Nurse Communication: Mobility status  Activity Tolerance: Patient tolerated treatment well Patient left: in bed;with call bell/phone within reach;with family/visitor present  OT Visit Diagnosis: Unsteadiness on feet (R26.81);Other abnormalities of gait and mobility (R26.89);History of falling (Z91.81);Repeated falls (R29.6)                Time: 1610-9604 OT Time Calculation (min): 22 min Charges:  OT General Charges $OT Visit: 1 Visit OT Evaluation $OT Eval Moderate Complexity: 1 Mod  Jackelyn Poling OTR/L, MS Acute Rehabilitation Department Office# [phone] Pager# [phone]   [name] 03/30/2021, 4:27 PM

## 2021-03-31 DIAGNOSIS — I4892 Unspecified atrial flutter: Secondary | ICD-10-CM | POA: Diagnosis present

## 2021-03-31 DIAGNOSIS — E860 Dehydration: Secondary | ICD-10-CM | POA: Diagnosis present

## 2021-03-31 DIAGNOSIS — L89152 Pressure ulcer of sacral region, stage 2: Secondary | ICD-10-CM | POA: Diagnosis present

## 2021-03-31 DIAGNOSIS — C3432 Malignant neoplasm of lower lobe, left bronchus or lung: Secondary | ICD-10-CM | POA: Diagnosis present

## 2021-03-31 DIAGNOSIS — M6259 Muscle wasting and atrophy, not elsewhere classified, multiple sites: Secondary | ICD-10-CM | POA: Diagnosis not present

## 2021-03-31 DIAGNOSIS — R1312 Dysphagia, oropharyngeal phase: Secondary | ICD-10-CM | POA: Diagnosis not present

## 2021-03-31 DIAGNOSIS — E1169 Type 2 diabetes mellitus with other specified complication: Secondary | ICD-10-CM | POA: Diagnosis present

## 2021-03-31 DIAGNOSIS — R627 Adult failure to thrive: Secondary | ICD-10-CM | POA: Diagnosis present

## 2021-03-31 DIAGNOSIS — L8990 Pressure ulcer of unspecified site, unspecified stage: Secondary | ICD-10-CM | POA: Insufficient documentation

## 2021-03-31 DIAGNOSIS — R278 Other lack of coordination: Secondary | ICD-10-CM | POA: Diagnosis not present

## 2021-03-31 DIAGNOSIS — G20 Parkinson's disease: Secondary | ICD-10-CM

## 2021-03-31 DIAGNOSIS — J9611 Chronic respiratory failure with hypoxia: Secondary | ICD-10-CM | POA: Diagnosis present

## 2021-03-31 DIAGNOSIS — E440 Moderate protein-calorie malnutrition: Secondary | ICD-10-CM | POA: Diagnosis present

## 2021-03-31 DIAGNOSIS — D500 Iron deficiency anemia secondary to blood loss (chronic): Secondary | ICD-10-CM | POA: Diagnosis not present

## 2021-03-31 DIAGNOSIS — Z9981 Dependence on supplemental oxygen: Secondary | ICD-10-CM | POA: Diagnosis not present

## 2021-03-31 DIAGNOSIS — D509 Iron deficiency anemia, unspecified: Secondary | ICD-10-CM | POA: Diagnosis present

## 2021-03-31 DIAGNOSIS — R8271 Bacteriuria: Secondary | ICD-10-CM | POA: Diagnosis not present

## 2021-03-31 DIAGNOSIS — Z9221 Personal history of antineoplastic chemotherapy: Secondary | ICD-10-CM | POA: Diagnosis not present

## 2021-03-31 DIAGNOSIS — R41841 Cognitive communication deficit: Secondary | ICD-10-CM | POA: Diagnosis not present

## 2021-03-31 DIAGNOSIS — Z79899 Other long term (current) drug therapy: Secondary | ICD-10-CM | POA: Diagnosis not present

## 2021-03-31 DIAGNOSIS — Z87891 Personal history of nicotine dependence: Secondary | ICD-10-CM | POA: Diagnosis not present

## 2021-03-31 DIAGNOSIS — F32A Depression, unspecified: Secondary | ICD-10-CM | POA: Diagnosis present

## 2021-03-31 DIAGNOSIS — Z923 Personal history of irradiation: Secondary | ICD-10-CM | POA: Diagnosis not present

## 2021-03-31 DIAGNOSIS — E041 Nontoxic single thyroid nodule: Secondary | ICD-10-CM | POA: Diagnosis present

## 2021-03-31 DIAGNOSIS — R531 Weakness: Secondary | ICD-10-CM | POA: Diagnosis not present

## 2021-03-31 DIAGNOSIS — E876 Hypokalemia: Secondary | ICD-10-CM | POA: Diagnosis present

## 2021-03-31 DIAGNOSIS — Z7401 Bed confinement status: Secondary | ICD-10-CM | POA: Diagnosis not present

## 2021-03-31 DIAGNOSIS — J90 Pleural effusion, not elsewhere classified: Secondary | ICD-10-CM | POA: Diagnosis present

## 2021-03-31 DIAGNOSIS — Z20822 Contact with and (suspected) exposure to covid-19: Secondary | ICD-10-CM | POA: Diagnosis present

## 2021-03-31 DIAGNOSIS — I10 Essential (primary) hypertension: Secondary | ICD-10-CM | POA: Diagnosis present

## 2021-03-31 DIAGNOSIS — Z8249 Family history of ischemic heart disease and other diseases of the circulatory system: Secondary | ICD-10-CM | POA: Diagnosis not present

## 2021-03-31 DIAGNOSIS — M6281 Muscle weakness (generalized): Secondary | ICD-10-CM | POA: Diagnosis not present

## 2021-03-31 DIAGNOSIS — J439 Emphysema, unspecified: Secondary | ICD-10-CM | POA: Diagnosis present

## 2021-03-31 LAB — COMPREHENSIVE METABOLIC PANEL
ALT: <5 U/L (ref 0–44)
AST: 10 U/L — ABNORMAL LOW (ref 15–41)
Albumin: 2.3 g/dL — ABNORMAL LOW (ref 3.5–5.0)
Alkaline Phosphatase: 69 U/L (ref 38–126)
Anion gap: 7 (ref 5–15)
BUN: 8 mg/dL (ref 8–23)
CO2: 29 mmol/L (ref 22–32)
Calcium: 8.3 mg/dL — ABNORMAL LOW (ref 8.9–10.3)
Chloride: 101 mmol/L (ref 98–111)
Creatinine, Ser: 0.63 mg/dL (ref 0.61–1.24)
GFR, Estimated: >60 mL/min (ref >60)
Glucose, Bld: 106 mg/dL — ABNORMAL HIGH (ref 70–99)
Potassium: 4.2 mmol/L (ref 3.5–5.1)
Sodium: 137 mmol/L (ref 135–145)
Total Bilirubin: 0.3 mg/dL (ref 0.3–1.2)
Total Protein: 5.4 g/dL — ABNORMAL LOW (ref 6.5–8.1)

## 2021-03-31 LAB — CBC
HCT: 31.8 % — ABNORMAL LOW (ref 39.0–52.0)
Hemoglobin: 9.5 g/dL — ABNORMAL LOW (ref 13.0–17.0)
MCH: 25.2 pg — ABNORMAL LOW (ref 26.0–34.0)
MCHC: 29.9 g/dL — ABNORMAL LOW (ref 30.0–36.0)
MCV: 84.4 fL (ref 80.0–100.0)
Platelets: 179 10*3/uL (ref 150–400)
RBC: 3.77 MIL/uL — ABNORMAL LOW (ref 4.22–5.81)
RDW: 30.6 % — ABNORMAL HIGH (ref 11.5–15.5)
WBC: 6.1 10*3/uL (ref 4.0–10.5)
nRBC: 0 % (ref 0.0–0.2)

## 2021-03-31 LAB — HEMOGLOBIN A1C
Hgb A1c MFr Bld: 5 % (ref 4.8–5.6)
Mean Plasma Glucose: 96.8 mg/dL

## 2021-03-31 LAB — URINE CULTURE

## 2021-03-31 LAB — MAGNESIUM: Magnesium: 2 mg/dL (ref 1.7–2.4)

## 2021-03-31 LAB — GLUCOSE, CAPILLARY: Glucose-Capillary: 140 mg/dL — ABNORMAL HIGH (ref 70–99)

## 2021-03-31 MED ORDER — INSULIN ASPART 100 UNIT/ML IJ SOLN
0.0000 [IU] | Freq: Three times a day (TID) | INTRAMUSCULAR | Status: DC
  Administered 2021-03-31: 1 [IU] via SUBCUTANEOUS
  Administered 2021-04-01: 5 [IU] via SUBCUTANEOUS
  Administered 2021-04-01 – 2021-04-02 (×3): 1 [IU] via SUBCUTANEOUS
  Administered 2021-04-02: 2 [IU] via SUBCUTANEOUS
  Administered 2021-04-02 – 2021-04-03 (×3): 1 [IU] via SUBCUTANEOUS

## 2021-03-31 NOTE — Progress Notes (Signed)
°   03/31/21 0508  Assess: MEWS Score  Temp 98.4 F (36.9 C)  BP 125/61  Pulse Rate 73  ECG Heart Rate (!) 133  Resp 18  Level of Consciousness Alert  SpO2 96 %  O2 Device Nasal Cannula  O2 Flow Rate (L/min) 4 L/min  Assess: MEWS Score  MEWS Temp 0  MEWS Systolic 0  MEWS Pulse 3  MEWS RR 0  MEWS LOC 0  MEWS Score 3  MEWS Score Color Yellow  Assess: if the MEWS score is Yellow or Red  Were vital signs taken at a resting state? Yes  Focused Assessment No change from prior assessment  Does the patient meet 2 or more of the SIRS criteria? No  MEWS guidelines implemented *See Row Information* Yes  Treat  MEWS Interventions Administered scheduled meds/treatments;Other (Comment) (Notified on call provider of increased HR; scheduled meds given early.)  Assess: SIRS CRITERIA  SIRS Temperature  0  SIRS Pulse 1  SIRS Respirations  0  SIRS WBC 0  SIRS Score Sum  1

## 2021-03-31 NOTE — Progress Notes (Signed)
[facility]  PROGRESS NOTE  James [name] XNA:[id number] DOB: 16-Jun-1945 DOA: 03/29/2021 PCP: [name], MD   Brief HPI:   76 year old male with medical history of stage IIIa non-small cell lung cancer, recently completed radiation, chemotherapy held due to functional decline, COPD, chronic respiratory failure with hypoxia on 4 L supplemental oxygen via nasal cannula at home, paroxysmal atrial flutter on Eliquis, Parkinson's disease, diabetes mellitus type 2, hypertension, hyperlipidemia, iron deficiency anemia, depression who presented to ED with complaints of generalized weakness.  Patient reported 1 month of progressive generalized weakness with frequent falls.  Also reported chronic diarrhea with chronic shortness of breath with cough productive of clear phlegm.  In the ED UA showed more than 50 RBC per high-power field, 11-20 WBC/hpf, SARS-CoV-2 and influenza PCR negative  Portable chest x-ray shows unchanged left pleural effusion and airspace opacities throughout the left lung with volume loss compatible with postobstructive changes related to the left perihilar mass demonstrated on prior chest CT and PET/CT.  No new findings seen.   CT head without contrast negative for acute intracranial abnormality.  Stable senescent changes and stable mild ventriculomegaly noted.    Subjective   Patient seen and examined, denies any complaints.  PT eval obtained, recommend skilled nursing facility for rehab.   Assessment/Plan:    Generalized weakness/failure to thrive -Presented with generalized weakness due to cancer, recent chemoradiation therapy -Also has Parkinson disease causing recurrent falls -P.o. intake is slowly improving -Started on IV fluids; off IV fluids now -PT/OT consult -Fall precautions  COPD/chronic respiratory failure -Patient on 4 L/min of oxygen via [place] -Continue albuterol nebulizer and as needed albuterol  Bacteriuria -UA shows many bacteria -Urine  culture showed multiple growth -He was given ceftriaxone in the ED -Not on antibiotics at this time  Hypokalemia/hypomagnesemia -Replete  Paroxysmal atrial flutter -Continue diltiazem 300 mg p.o. daily, Lopressor 25 mg twice daily -Continue anticoagulation with Eliquis  Diabetes mellitus type 2 -Metformin on hold -Continue sliding scale insulin NovoLog  Primary cancer of left lower lung -Follows oncology Dr. Julien Nordmann -Recently completed partial radiation therapy -Last 2 cycles of chemotherapy canceled due to functional decline  Iron-deficiency anemia -Stable  Parkinson disease -Continue Sinemet  Thyroid nodule -2.1 cm nodule on thyroid shown on ultrasound 12/22 last admission -Patient says that he is followed with endocrinology at that point repeat thyroid levels were reassuring -Further work-up was not recommended  Depression -Continue Wellbutrin, mirtazapine, sertraline        Medications     apixaban  5 mg Oral BID   arformoterol  15 mcg Nebulization Q12H   atorvastatin  20 mg Oral QHS   Carbidopa-Levodopa ER  1 tablet Oral BID   diltiazem  300 mg Oral Daily   donepezil  10 mg Oral QHS   ferrous gluconate  324 mg Oral Q breakfast   metoprolol tartrate  25 mg Oral BID   mirtazapine  15 mg Oral QHS   modafinil  100 mg Oral Daily   pantoprazole  40 mg Oral QAC supper   sertraline  150 mg Oral Daily   sodium chloride flush  3 mL Intravenous Q12H     Data Reviewed:   CBG:  No results for input(s): GLUCAP in the last 168 hours.  SpO2: 93 % O2 Flow Rate (L/min): 4 L/min    Vitals:   03/31/21 0025 03/31/21 0500 03/31/21 0508 03/31/21 0915  BP: 121/70  125/61   Pulse: 79  73   Resp: 18  18   Temp: 99.3 F (37.4 C)  98.4 F (36.9 C)   TempSrc: Oral  Oral   SpO2: 97%  96% 93%  Weight:  87.1 kg    Height:          Data Reviewed:  Basic Metabolic Panel: Recent Labs  Lab 03/27/21 0915 03/29/21 1718 03/29/21 1805 03/30/21 0530  03/31/21 0802  NA 142 139 143 135 137  K 3.2* 3.2* 3.5 3.2* 4.2  CL 105 105 103 100 101  CO2 27 27  --  25 29  GLUCOSE 180* 97 89 91 106*  BUN 17 16 13 11 8   CREATININE 0.94 0.85 0.70 0.69 0.63  CALCIUM 9.4 8.7*  --  8.0* 8.3*  MG  --   --   --  1.3* 2.0    CBC: Recent Labs  Lab 03/27/21 0915 03/29/21 1718 03/29/21 1805 03/30/21 0530 03/31/21 0802  WBC 5.2 7.5  --  5.7 6.1  NEUTROABS 4.1 6.3  --   --   --   HGB 9.8* 9.4* 9.5* 9.1* 9.5*  HCT 31.2* 31.2* 28.0* 30.1* 31.8*  MCV 79.0* 82.8  --  82.9 84.4  PLT 223 209  --  172 179       Antibiotics: Anti-infectives (From admission, onward)    Start     Dose/Rate Route Frequency Ordered Stop   03/30/21 2100  cefTRIAXone (ROCEPHIN) 1 g in sodium chloride 0.9 % 100 mL IVPB  Status:  Discontinued        1 g 200 mL/hr over 30 Minutes Intravenous Every 24 hours 03/29/21 2330 03/29/21 2341   03/29/21 2130  cefTRIAXone (ROCEPHIN) 1 g in sodium chloride 0.9 % 100 mL IVPB        1 g 200 mL/hr over 30 Minutes Intravenous  Once 03/29/21 2120 03/29/21 2221   03/29/21 0000  cefUROXime (CEFTIN) 250 MG tablet        250 mg Oral 2 times daily with meals 03/29/21 2126 04/05/21 2359        DVT prophylaxis: Apixaban  Code Status: Full code  Family Communication: No family at bedside      Objective    Physical Examination:   General-appears in no acute distress Heart-S1-S2, regular, no murmur auscultated Lungs-clear to auscultation bilaterally, no wheezing or crackles auscultated Abdomen-soft, nontender, no organomegaly Extremities-no edema in the lower extremities Neuro-alert, oriented x3, no focal deficit noted  Status is: Inpatient failure to thrive    Pressure Injury 03/30/21 Coccyx Mid Stage 2 -  Partial thickness loss of dermis presenting as a shallow open injury with a red, pink wound bed without slough. (Active)  03/30/21 1703  Location: Coccyx  Location Orientation: Mid  Staging: Stage 2 -  Partial  thickness loss of dermis presenting as a shallow open injury with a red, pink wound bed without slough.  Wound Description (Comments):   Present on Admission: Yes     [place]   [facility] If 7PM-7AM, please contact night-coverage at [email or web address], Office  [phone]   03/31/2021, 1:40 PM  LOS: 0 days

## 2021-03-31 NOTE — TOC Initial Note (Signed)
Transition of Care [facility]) - Initial/Assessment Note    Patient Details  Name: James [name] MRN: [id number] Date of Birth: [birth year]  Transition of Care [facility]) CM/SW Contact:    Ross Ludwig, LCSW Phone Number: 03/31/2021, 6:10 PM  Clinical Narrative:                 Patient is a 76 year old male who is alert and oriented x4.  Patient's wife is the main contact, patient has not been to SNF before for rehab.  CSW explained to patient's wife the process and how insurance pays for stay at [facility].  Per patient's wife, he needs some rehab, before returning back home due to multiple falls recently and overall weakness.  Patient's wife use to be a hospital case manager and is somewhat familiar with process for rehab.  Patient's wife would like patient to stay in [facility], [place] was given permission to begin bed search.  CSW has sent patient's information out to SNFs awaiting bed offers.  Expected Discharge Plan: Skilled Nursing Facility Barriers to Discharge: Continued Medical Work up   Patient Goals and CMS Choice Patient states their goals for this hospitalization and ongoing recovery are:: To go to SNF for rehab, then return back home. CMS Medicare.gov Compare Post Acute Care list provided to:: Patient Represenative (must comment) Choice offered to / list presented to : Spouse  Expected Discharge Plan and Services Expected Discharge Plan: [place]                                              Prior Living Arrangements/Services   Lives with:: Spouse Patient language and need for interpreter reviewed:: Yes Do you feel safe going back to the place where you live?: No   Patient and wife feel he needs short term rehab, before returning back home.  Need for Family Participation in Patient Care: Yes (Comment) Care giver support system in place?: No (comment)   Criminal Activity/Legal Involvement Pertinent to Current Situation/Hospitalization: No - Comment  as needed  Activities of Daily Living Home Assistive Devices/Equipment: Cane (specify quad or straight), Walker (specify type) ADL Screening (condition at time of admission) Patient's cognitive ability adequate to safely complete daily activities?: Yes Is the patient deaf or have difficulty hearing?: No Does the patient have difficulty seeing, even when wearing glasses/contacts?: Yes Does the patient have difficulty concentrating, remembering, or making decisions?: No Patient able to express need for assistance with ADLs?: Yes Does the patient have difficulty dressing or bathing?: No Independently performs ADLs?: Yes (appropriate for developmental age) Does the patient have difficulty walking or climbing stairs?: Yes Weakness of Legs: Both Weakness of Arms/Hands: Both  Permission Sought/Granted Permission sought to share information with : Case Manager, Customer service manager, Family Supports Permission granted to share information with : Yes, Release of Information Signed  Share Information with NAME: Nehal, Shives [phone]  [phone]  Permission granted to share info w AGENCY: SNF admissions        Emotional Assessment Appearance:: Appears stated age   Affect (typically observed): Accepting, Appropriate, Calm Orientation: : Oriented to Self, Oriented to Place, Oriented to  Time, Oriented to Situation Alcohol / Substance Use: Not Applicable Psych Involvement: No (comment)  Admission diagnosis:  Dehydration [E86.0] Weakness [R53.1] Acute cystitis without hematuria [N30.00] Generalized weakness [R53.1] Malignant neoplasm of lung, unspecified laterality,  unspecified part of lung ([place]) [C34.90] Failure to thrive in adult [R62.7] Patient Active Problem List   Diagnosis Date Noted   Pressure injury of skin 03/31/2021   Failure to thrive in adult 03/31/2021   Dehydration 03/29/2021   Chronic respiratory failure with hypoxia ([place]) 03/29/2021   Thyroid nodule  03/29/2021   Bacteriuria 03/29/2021   Generalized weakness 03/29/2021   Secondary hypercoagulable state ([place]) 03/14/2021   Abnormal TSH 02/14/2021   Acute on chronic respiratory failure with hypoxia ([place]) 02/13/2021   Tachycardia, unspecified 02/11/2021   Paroxysmal atrial flutter ([place]) 02/11/2021   COVID-19 virus infection 02/11/2021   Hypertension    Hyperlipidemia associated with type 2 diabetes mellitus ([place])    Diabetes mellitus, type 2 ([place])    Depression    Mild protein malnutrition ([place])    Chest pain 02/06/2021   Encounter for antineoplastic chemotherapy 02/01/2021   Endobronchial cancer, left ([place])    Chest pain, precordial 01/22/2021   AVM (arteriovenous malformation) of small bowel, 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   Iron deficiency anemia    Heme positive stool    History of colonic polyps    Primary cancer of left lower lobe of lung ([place]) 04/14/2013   Upper airway cough syndrome 04/14/2013   Hypoxemia 06/08/2012   Parkinson's disease ([place]) 2014   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

## 2021-03-31 NOTE — Plan of Care (Signed)

## 2021-03-31 NOTE — NC FL2 (Signed)
[place] LEVEL OF CARE SCREENING TOOL     IDENTIFICATION  Patient Name: James [name] Birthdate: Jul 18, 1945 Sex: male Admission Date (Current Location): 03/29/2021  [facility] [place] Number:  Herbalist and Address:  [facility],  [place], [place]      Provider Number: [id number]  Attending Physician Name and Address:  Oswald Hillock, MD  Relative Name and Phone Number:  Wright [phone]  [phone]    Current Level of Care: Hospital Recommended Level of Care: [place] Prior Approval Number:    Date Approved/Denied:   PASRR Number: [id number]  Discharge Plan: SNF    Current Diagnoses: Patient Active Problem List   Diagnosis Date Noted   Pressure injury of skin 03/31/2021   Failure to thrive in adult 03/31/2021   Dehydration 03/29/2021   Chronic respiratory failure with hypoxia ([place]) 03/29/2021   Thyroid nodule 03/29/2021   Bacteriuria 03/29/2021   Generalized weakness 03/29/2021   Secondary hypercoagulable state ([place]) 03/14/2021   Abnormal TSH 02/14/2021   Acute on chronic respiratory failure with hypoxia ([place]) 02/13/2021   Tachycardia, unspecified 02/11/2021   Paroxysmal atrial flutter ([place]) 02/11/2021   COVID-19 virus infection 02/11/2021   Hypertension    Hyperlipidemia associated with type 2 diabetes mellitus ([place])    Diabetes mellitus, type 2 ([place])    Depression    Mild protein malnutrition ([place])    Chest pain 02/06/2021   Encounter for antineoplastic chemotherapy 02/01/2021   Endobronchial cancer, left (HCC)    Chest pain, precordial 01/22/2021   AVM (arteriovenous malformation) of small bowel, 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   Iron deficiency anemia    Heme positive stool    History of colonic polyps    Primary cancer of left lower lobe of lung ([place]) 04/14/2013   Upper airway cough syndrome 04/14/2013   Hypoxemia 06/08/2012   Parkinson's disease ([place]) 2014   COPD with  emphysema ([place]) 06/17/2007    Orientation RESPIRATION BLADDER Height & Weight     Self, Time, Situation, Place  O2 (4L) Continent Weight: 192 lb 0.3 oz (87.1 kg) Height:  6\' 1"  (185.4 cm)  BEHAVIORAL SYMPTOMS/MOOD NEUROLOGICAL BOWEL NUTRITION STATUS      Continent Diet (Heart Healthy)  AMBULATORY STATUS COMMUNICATION OF NEEDS Skin   Limited Assist Verbally PU Stage and Appropriate Care   PU Stage 2 Dressing:  (Every 3 days)                   Personal Care Assistance Level of Assistance  Bathing, Feeding, Dressing Bathing Assistance: Limited assistance Feeding assistance: Independent Dressing Assistance: Limited assistance     Functional Limitations Info  Sight, Hearing, Speech Sight Info: Adequate Hearing Info: Adequate Speech Info: Adequate    SPECIAL CARE FACTORS FREQUENCY  PT (By licensed PT), OT (By licensed OT)     PT Frequency: Minimum 5x a week OT Frequency: Minimum 5x a week            Contractures Contractures Info: Not present    Additional Factors Info  Code Status, Allergies, Insulin Sliding Scale, Psychotropic Code Status Info: Full Code Allergies Info: Aspirin   Codeine   Morphine Psychotropic Info: mirtazapine (REMERON) tablet 15 mg, sertraline (ZOLOFT) tablet 150 mg Insulin Sliding Scale Info: insulin aspart (novoLOG) injection 0-9 Units 3x a day with meals       Current Medications (03/31/2021):  This is the current hospital active medication list Current Facility-Administered Medications  Medication  Dose Route Frequency Provider Last Rate Last Admin   0.9 %  sodium chloride infusion   Intravenous PRN Oswald Hillock, MD 10 mL/hr at 03/30/21 1827 New Bag at 03/30/21 1827   acetaminophen (TYLENOL) tablet 650 mg  650 mg Oral Q6H PRN Lenore Cordia, MD   650 mg at 03/31/21 1544   Or   acetaminophen (TYLENOL) suppository 650 mg  650 mg Rectal Q6H PRN Lenore Cordia, MD       albuterol (PROVENTIL) (2.5 MG/3ML) 0.083% nebulizer solution 2.5 mg   2.5 mg Nebulization Q4H PRN Lenore Cordia, MD       apixaban (ELIQUIS) tablet 5 mg  5 mg Oral BID Zada Finders R, MD   5 mg at 03/31/21 1018   arformoterol (BROVANA) nebulizer solution 15 mcg  15 mcg Nebulization Q12H Lenore Cordia, MD   15 mcg at 03/31/21 0914   atorvastatin (LIPITOR) tablet 20 mg  20 mg Oral QHS Zada Finders R, MD   20 mg at 03/30/21 2100   Carbidopa-Levodopa ER (SINEMET CR) 25-100 MG tablet controlled release 1 tablet  1 tablet Oral BID Lenore Cordia, MD   1 tablet at 03/31/21 1018   diltiazem (CARDIZEM CD) 24 hr capsule 300 mg  300 mg Oral Daily Zada Finders R, MD   300 mg at 03/31/21 0543   donepezil (ARICEPT) tablet 10 mg  10 mg Oral QHS Zada Finders R, MD   10 mg at 03/30/21 2100   ferrous gluconate (FERGON) tablet 324 mg  324 mg Oral Q breakfast Lenore Cordia, MD   324 mg at 03/31/21 0834   HYDROcodone-acetaminophen (NORCO/VICODIN) 5-325 MG per tablet 1 tablet  1 tablet Oral Q6H PRN Lenore Cordia, MD   1 tablet at 03/30/21 1423   insulin aspart (novoLOG) injection 0-9 Units  0-9 Units Subcutaneous TID WC Oswald Hillock, MD   1 Units at 03/31/21 1714   metoprolol tartrate (LOPRESSOR) tablet 25 mg  25 mg Oral BID Lenore Cordia, MD   25 mg at 03/31/21 0542   mirtazapine (REMERON) tablet 15 mg  15 mg Oral QHS Zada Finders R, MD   15 mg at 03/30/21 2100   modafinil (PROVIGIL) tablet 100 mg  100 mg Oral Daily Zada Finders R, MD   100 mg at 03/31/21 1018   ondansetron (ZOFRAN) tablet 4 mg  4 mg Oral Q6H PRN Lenore Cordia, MD       Or   ondansetron (ZOFRAN) injection 4 mg  4 mg Intravenous Q6H PRN Lenore Cordia, MD       pantoprazole (PROTONIX) EC tablet 40 mg  40 mg Oral QAC supper Zada Finders R, MD   40 mg at 03/31/21 1715   senna-docusate (Senokot-S) tablet 1 tablet  1 tablet Oral QHS PRN Lenore Cordia, MD       sertraline (ZOLOFT) tablet 150 mg  150 mg Oral Daily Zada Finders R, MD   150 mg at 03/31/21 1018   sodium chloride flush (NS) 0.9 % injection  3 mL  3 mL Intravenous Q12H Lenore Cordia, MD   3 mL at 03/30/21 2101     Discharge Medications: Please see discharge summary for a list of discharge medications.  Relevant Imaging Results:  Relevant Lab Results:   Additional Information SSN [id number]  Ross Ludwig, LCSW

## 2021-04-01 DIAGNOSIS — D500 Iron deficiency anemia secondary to blood loss (chronic): Secondary | ICD-10-CM

## 2021-04-01 DIAGNOSIS — E440 Moderate protein-calorie malnutrition: Secondary | ICD-10-CM | POA: Insufficient documentation

## 2021-04-01 DIAGNOSIS — J9611 Chronic respiratory failure with hypoxia: Secondary | ICD-10-CM

## 2021-04-01 LAB — BASIC METABOLIC PANEL
Anion gap: 7 (ref 5–15)
BUN: 9 mg/dL (ref 8–23)
CO2: 31 mmol/L (ref 22–32)
Calcium: 8.5 mg/dL — ABNORMAL LOW (ref 8.9–10.3)
Chloride: 101 mmol/L (ref 98–111)
Creatinine, Ser: 0.66 mg/dL (ref 0.61–1.24)
GFR, Estimated: >60 mL/min (ref >60)
Glucose, Bld: 144 mg/dL — ABNORMAL HIGH (ref 70–99)
Potassium: 4.1 mmol/L (ref 3.5–5.1)
Sodium: 139 mmol/L (ref 135–145)

## 2021-04-01 LAB — GLUCOSE, CAPILLARY
Glucose-Capillary: 131 mg/dL — ABNORMAL HIGH (ref 70–99)
Glucose-Capillary: 148 mg/dL — ABNORMAL HIGH (ref 70–99)
Glucose-Capillary: 254 mg/dL — ABNORMAL HIGH (ref 70–99)
Glucose-Capillary: 103 mg/dL — ABNORMAL HIGH (ref 70–99)

## 2021-04-01 MED ORDER — DEXTROSE-NACL 5-0.45 % IV SOLN: INTRAVENOUS | Status: AC

## 2021-04-01 MED ORDER — SODIUM CHLORIDE 0.9 % IV BOLUS
250.0000 mL | Freq: Once | INTRAVENOUS | Status: AC
  Administered 2021-04-01: 250 mL via INTRAVENOUS

## 2021-04-01 MED ORDER — METOPROLOL TARTRATE 5 MG/5ML IV SOLN
5.0000 mg | INTRAVENOUS | Status: AC | PRN
  Administered 2021-04-01 (×2): 5 mg via INTRAVENOUS
  Filled 2021-04-01 (×2): qty 5

## 2021-04-01 MED ORDER — ADULT MULTIVITAMIN W/MINERALS CH
1.0000 | ORAL_TABLET | Freq: Every day | ORAL | Status: DC
  Administered 2021-04-01 – 2021-04-03 (×3): 1 via ORAL
  Filled 2021-04-01 (×3): qty 1

## 2021-04-01 MED ORDER — BOOST / RESOURCE BREEZE PO LIQD CUSTOM
1.0000 | Freq: Three times a day (TID) | ORAL | Status: DC
  Administered 2021-04-01 – 2021-04-03 (×6): 1 via ORAL

## 2021-04-01 NOTE — Progress Notes (Signed)
[facility] Hospitalist  PROGRESS NOTE  James [name] CLE:[id number] DOB: [birth year] DOA: 03/29/2021 PCP: [name], MD   Brief HPI:   76 year old male with medical history of stage IIIa non-small cell lung cancer, recently completed radiation, chemotherapy held due to functional decline, COPD, chronic respiratory failure with hypoxia on 4 L supplemental oxygen via nasal cannula at home, paroxysmal atrial flutter on Eliquis, Parkinson's disease, diabetes mellitus type 2, hypertension, hyperlipidemia, iron deficiency anemia, depression who presented to ED with complaints of generalized weakness.  Patient reported 1 month of progressive generalized weakness with frequent falls.  Also reported chronic diarrhea with chronic shortness of breath with cough productive of clear phlegm.  In the ED UA showed more than 50 RBC per high-power field, 11-20 WBC/hpf, SARS-CoV-2 and influenza PCR negative  Portable chest x-ray shows unchanged left pleural effusion and airspace opacities throughout the left lung with volume loss compatible with postobstructive changes related to the left perihilar mass demonstrated on prior chest CT and PET/CT.  No new findings seen.   CT head without contrast negative for acute intracranial abnormality.  Stable senescent changes and stable mild ventriculomegaly noted.    Subjective   Patient seen and examined, feels weak this morning.   Assessment/Plan:    Generalized weakness/failure to thrive -Presented with generalized weakness due to cancer, recent chemoradiation therapy -Also has Parkinson disease causing recurrent falls -P.o. intake is slowly improving -We will restart on IV fluids D5 half-normal saline at 75 mL/h for 24 hours -PT/OT consulted -Plan to go to skilled nursing facility -Fall precautions  COPD/chronic respiratory failure -Patient on 4 L/min of oxygen via [place] -Continue albuterol nebulizer and as needed albuterol  Bacteriuria -UA shows many  bacteria -Urine culture showed multiple growth -He was given ceftriaxone in the ED -Not on antibiotics at this time  Hypokalemia/hypomagnesemia -Replete  Paroxysmal atrial flutter -Continue diltiazem 300 mg p.o. daily, Lopressor 25 mg twice daily -Continue anticoagulation with Eliquis  Diabetes mellitus type 2 -Metformin on hold -Continue sliding scale insulin NovoLog  Primary cancer of left lower lung -Follows oncology Dr. Julien Nordmann -Recently completed partial radiation therapy -Last 2 cycles of chemotherapy canceled due to functional decline  Iron-deficiency anemia -Stable  Parkinson disease -Continue Sinemet  Thyroid nodule -2.1 cm nodule on thyroid shown on ultrasound 12/22 last admission -Patient says that he is followed with endocrinology at that point repeat thyroid levels were reassuring -Further work-up was not recommended  Depression -Continue Wellbutrin, mirtazapine, sertraline        Medications     apixaban  5 mg Oral BID   arformoterol  15 mcg Nebulization Q12H   atorvastatin  20 mg Oral QHS   Carbidopa-Levodopa ER  1 tablet Oral BID   diltiazem  300 mg Oral Daily   donepezil  10 mg Oral QHS   ferrous gluconate  324 mg Oral Q breakfast   insulin aspart  0-9 Units Subcutaneous TID WC   metoprolol tartrate  25 mg Oral BID   mirtazapine  15 mg Oral QHS   modafinil  100 mg Oral Daily   pantoprazole  40 mg Oral QAC supper   sertraline  150 mg Oral Daily   sodium chloride flush  3 mL Intravenous Q12H     Data Reviewed:   CBG:  Recent Labs  Lab 03/31/21 1650 04/01/21 0732 04/01/21 1206  GLUCAP 140* 131* 148*    SpO2: 97 % O2 Flow Rate (L/min): 4 L/min (4 lpm dep)    Vitals:  04/01/21 0820 04/01/21 0950 04/01/21 1046 04/01/21 1208  BP:  (!) 110/56 109/81 (!) 141/71  Pulse:  (!) 52 81 67  Resp:   (!) 23 20  Temp:   98.2 F (36.8 C) 97.8 F (36.6 C)  TempSrc:   Oral Oral  SpO2: 96%  97% 97%  Weight:      Height:           Data Reviewed:  Basic Metabolic Panel: Recent Labs  Lab 03/27/21 0915 03/29/21 1718 03/29/21 1805 03/30/21 0530 03/31/21 0802 04/01/21 1002  NA 142 139 143 135 137 139  K 3.2* 3.2* 3.5 3.2* 4.2 4.1  CL 105 105 103 100 101 101  CO2 27 27  --  25 29 31   GLUCOSE 180* 97 89 91 106* 144*  BUN 17 16 13 11 8 9   CREATININE 0.94 0.85 0.70 0.69 0.63 0.66  CALCIUM 9.4 8.7*  --  8.0* 8.3* 8.5*  MG  --   --   --  1.3* 2.0  --     CBC: Recent Labs  Lab 03/27/21 0915 03/29/21 1718 03/29/21 1805 03/30/21 0530 03/31/21 0802  WBC 5.2 7.5  --  5.7 6.1  NEUTROABS 4.1 6.3  --   --   --   HGB 9.8* 9.4* 9.5* 9.1* 9.5*  HCT 31.2* 31.2* 28.0* 30.1* 31.8*  MCV 79.0* 82.8  --  82.9 84.4  PLT 223 209  --  172 179       Antibiotics: Anti-infectives (From admission, onward)    Start     Dose/Rate Route Frequency Ordered Stop   03/30/21 2100  cefTRIAXone (ROCEPHIN) 1 g in sodium chloride 0.9 % 100 mL IVPB  Status:  Discontinued        1 g 200 mL/hr over 30 Minutes Intravenous Every 24 hours 03/29/21 2330 03/29/21 2341   03/29/21 2130  cefTRIAXone (ROCEPHIN) 1 g in sodium chloride 0.9 % 100 mL IVPB        1 g 200 mL/hr over 30 Minutes Intravenous  Once 03/29/21 2120 03/29/21 2221   03/29/21 0000  cefUROXime (CEFTIN) 250 MG tablet        250 mg Oral 2 times daily with meals 03/29/21 2126 04/05/21 2359        DVT prophylaxis: Apixaban  Code Status: Full code  Family Communication: No family at bedside      Objective    Physical Examination:  General-appears in no acute distress Heart-S1-S2, regular, no murmur auscultated Lungs-clear to auscultation bilaterally, no wheezing or crackles auscultated Abdomen-soft, nontender, no organomegaly Extremities-no edema in the lower extremities Neuro-alert, oriented x3, no focal deficit noted  Status is: Inpatient failure to thrive    Pressure Injury 03/30/21 Coccyx Mid Stage 2 -  Partial thickness loss of dermis  presenting as a shallow open injury with a red, pink wound bed without slough. (Active)  03/30/21 1703  Location: Coccyx  Location Orientation: Mid  Staging: Stage 2 -  Partial thickness loss of dermis presenting as a shallow open injury with a red, pink wound bed without slough.  Wound Description (Comments):   Present on Admission: Yes     [place]   [facility] If 7PM-7AM, please contact night-coverage at [email or web address], Office  [phone]   04/01/2021, 2:14 PM  LOS: 1 day

## 2021-04-01 NOTE — TOC Progression Note (Addendum)
Transition of Care [facility]) - Progression Note    Patient Details  Name: James [name] MRN: [id number] Date of Birth: [birth year]  [facility] CM/SW Contact  Ranessa Kosta, Juliann Pulse, RN Phone Number: 04/01/2021, 10:50 AM  Clinical Narrative:  Will provide bed offers-await chosen facility.  12/45p-left vm w/spouse Lucy-await call back.  1. 1.3 mi [facility] [place], [place] [address] [phone] Overall rating Average 2. 1.6 mi [place] at [place], [place] [address] [phone] Overall rating Much below average 3. 2.1 mi [place], [place] [address] [phone] Overall rating Much below average 4. 2.5 mi [facility] at [place], [place] [address] [phone] Overall rating Below average 5. 2.8 mi [facility] at the [place], [place] [address] [phone] Overall rating Average 6. 2.8 mi [facility] [address] [place], [place] [address] [phone] Overall rating Much below average 7. [place], [place] [address] [phone] Overall rating Much above average 8. 3.6 [place] [address] [place], [place] [address] [phone] Overall rating Average 9. 3.6 mi [facility] [address] [place], [place] [address] [phone] Overall rating Much below average 10. 3.9 mi [facility] [place], [place] [address] [phone] Overall rating Much below average 11. 4.4 mi [facility] at [place], [place] [address] [phone] Overall rating Much above average 12. 5.5  mi [facility] [address] [place], [place] [address] [phone] Overall rating Above average 13. 8.2 [facility] [place], [place] [address] [phone] Overall rating Much above average 14. 9 mi [facility] [address] [place], [place] [address] [phone] Overall rating Above average 15. 9.1 mi [facility] and [place], [place] [address] [phone] Overall rating Average 16. 9.2 mi [facility] [address] [place], [place] [address] [phone] Overall rating Much above average 17. 10.8 mi [place] at [facility] [address] [place], [place] [address] [phone] Overall rating Much above average 18. 12.6 mi [facility] [address] [place], [place] [address] [phone] Overall rating Much below average 19. 12.8 [facility] [place], [place] [address] [phone] Overall rating Much below average 20. 14.2 mi [facility] [address] [place], [place] [address] [phone] Overall rating Below average 21. 14.4 mi [facility] [place], [place] [address] [phone] Overall rating Above average 22. 14.8 mi [place] and [facility] [place], [place] [address] [phone] Overall rating Much above average 23. 14.9 [place] [address] [place], [place] [address] [phone] Overall rating Much below average 24. 16.5 mi [place] [address] [place], [place] [address] [phone] Overall rating Average 25. 16.7 mi [facility] [place], [place] [address] [phone] Overall rating Much above average 26. 17.9 mi [place],  [address] [phone] Overall rating  Below average 27. 47.0 [facility] [address] [place], [place] [address] [phone] Overall rating Much below average 28. 19.7 mi [place] [address] [place], [place] [address] [phone] Overall rating Much below average 29. 20 mi [address] at the [facility] at [place], [place] [address] [phone] Overall rating Much above average 30. 21.1 mi [facility] and [facility] [place], [place] [address] [phone] Overall rating Much below average 31. 21.6 [address] [place], [place] [address] [phone] Overall rating Below average 32. 21.6 mi [facility] [address] [place], [place] [address] [phone] Overall rating Average 33. 21.8 [facility] [address] [place], [place] [address] [phone] Overall rating Much above average 34. [address] [place], [place] [address] [phone] Overall rating Much above average 35. 22.6 mi [facility] [address] [place], [place] [address] [phone] Overall rating Average 36. 22.7 mi [facility] [place], [place] [address] [phone] Overall rating Much below average 37. 23.3 mi [facility] [address] [place], [place] [address] [phone] Overall rating Above average 38. 23.5 [place], [place] [address] [phone] Overall rating Not available18 39. 24.1 mi [place] [address] [place], [place] [address] [phone] Overall rating Much below average 40. 24.2 mi [place] [address] [place], [place] [address] [phone] Overall rating Below average 41. 24.4 [facility] [address] [place], [place] [address] [phone] Overall rating Much below average 42. 24.5 mi [facility] [place], [place] [address] [phone] Overall rating Above average To explore and download nursing home data,  Expected Discharge Plan: [place] Barriers to Discharge: Continued Medical Work up  Expected Discharge Plan and Services Expected Discharge Plan: [place] Determinants of Health (SDOH) Interventions    Readmission Risk Interventions No flowsheet data found.

## 2021-04-01 NOTE — Progress Notes (Signed)
Initial Nutrition Assessment  DOCUMENTATION CODES:   Non-severe (moderate) malnutrition in context of chronic illness  INTERVENTION:  -Boost Breeze TID, between meals. Each supplement provides 250 kcal and 9 grams of protein  -MVI with minerals  -Provided diet education on taste and smell changes during cancer and cancer treatments   NUTRITION DIAGNOSIS:   Moderate Malnutrition related to chronic illness, cancer and cancer related treatments (stage IIIa non-small cell lung cancer) as evidenced by moderate fat depletion, mild muscle depletion, moderate muscle depletion.   GOAL:   Patient will meet greater than or equal to 90% of their needs   MONITOR:   PO intake, Supplement acceptance, Labs, Weight trends  REASON FOR ASSESSMENT:   Malnutrition Screening Tool    ASSESSMENT:    Pt is a 76 y.o. male with medical history significant for stage IIIa non-small cell lung cancer (recently completed radiation, chemotherapy held due to functional decline), chronic respiratory failure with hypoxia on 4 L O2 via nasal cannula, COPD, Parkinson's disease, T2DM, HTN, HLD, iron deficiency anemia, paroxysmal atrial flutter on Eliquis, depression, legally blind left eye, who presented to the ED with chief complaint of generalized weakness and admitted for dehydration.   Per chart review, pt reports generalized weakness for the past one month with frequent falls (due to Parkinson's disease).   Met with pt at bedside. Pt reports decreased energy levels for the past six weeks and a decreased appetite for the past 3-4 weeks. Per pt, he typically likes to eat chocolate pudding, cheese and crackers, and likes to drink orange juice. Unable to provide 24-hour recall from pt as pt was having a challenging time recalling this information. Per meal documentation, 75% of breakfast consumed on 2/5.  When asked pt about meals consumed yesterday (2/5), pt reports that "yesterday was a blur." Per pt, no chewing or  swallowing difficulties noted. Pt reports that he lives at home with his wife and uses a walker at baseline. Visited with pt again a few hours later and pt's wife was present. Per pt and pt's wife, pt is experiencing metallic taste and this has caused a decrease in PO intake due to all foods tasting like metal.   Reviewed weight encounters. Pt is currently 190.48#. Per weight encounters, pt has experienced a 5% weight loss in the past 1.5 month. Per pt, pt reports that he has lost 50# since his cancer diagnosis. Pt's wife also reports that pt has lost 30# since last July and ~50# in the past one year.   Discussed with pt the importance of good nutrition and adequate PO intake for energy/strength for the body. Discussed with pt ONS and pt reports that he does not like Ensure supplements, but is agreeable to trying Boost Breeze (peach-flavored). Provided handout from the Academy [employer], "Taste and Smell Changes," for pt and pt's wife.   Medications reviewed. Fergon, novolog SSI, protonix, dextrose @ 83ml/hr   Labs reviewed. CBGs: 131-140 x 24 hours, Iron: 16, A1C: 5.0     NUTRITION - FOCUSED PHYSICAL EXAM:  Flowsheet Row Most Recent Value  Orbital Region Moderate depletion  Upper Arm Region Moderate depletion  Thoracic and Lumbar Region No depletion  Buccal Region Moderate depletion  Temple Region Moderate depletion  Clavicle Bone Region Mild depletion  Clavicle and Acromion Bone Region Mild depletion  Scapular Bone Region Mild depletion  Dorsal Hand Mild depletion  Patellar Region Moderate depletion  Anterior Thigh Region Moderate depletion  Posterior Calf Region Moderate depletion  Edema (RD Assessment) None  Hair Reviewed  Eyes Reviewed  Mouth Reviewed  Skin Reviewed  Nails Reviewed       Diet Order:   Diet Order             Diet regular Room service appropriate? Yes; Fluid consistency: Thin  Diet effective now                   EDUCATION NEEDS:    Education needs have been addressed  Skin:  Skin Assessment: Skin Integrity Issues: Skin Integrity Issues:: Stage II Stage II: coccyx Other: abrasion; ecchymosis  Last BM:  PTA  Height:   Ht Readings from Last 1 Encounters:  03/29/21 $RemoveB'6\' 1"'mBHzGfJp$  (1.854 m)    Weight:   Wt Readings from Last 1 Encounters:  04/01/21 86.4 kg    BMI:  Body mass index is 25.13 kg/m.  Estimated Nutritional Needs:   Kcal:  1900 - 2100  Protein:  95 - 110 grams  Fluid:  2.2 L    Maryruth Hancock, Dietetic Intern 04/01/2021 2:57 PM

## 2021-04-01 NOTE — Progress Notes (Signed)
Physical Therapy Treatment Patient Details Name: James [name] MRN: [id number] DOB: 01-17-1946 Today's Date: 04/01/2021   History of Present Illness James [name] is a 76 y.o. male with medical history significant for stage IIIa non-small cell lung cancer (recently completed radiation, chemotherapy held due to functional decline), COPD, chronic respiratory failure with hypoxia on 4 L supplemental O2 via [place], paroxysmal atrial flutter on Eliquis, Parkinson's disease, T2DM, HTN, HLD, iron deficiency anemia, depression who presented to the ED for evaluation of generalized weakness.    PT Comments    Pt attempting to return to bed after ambulating back to bed from bathroom.  Pt with spouse.  Pt tangled in lines (telemetry, O2, IV).  Pt assisted with sit to stand to reposition self and lines and requiring mod assist due to increased fatigue and weakness.  Pt safely assisted back to bed per request.  Continue to recommend SNF upon d/c.   Recommendations for follow up therapy are one component of a multi-disciplinary discharge planning process, led by the attending physician.  Recommendations may be updated based on patient status, additional functional criteria and insurance authorization.  Follow Up Recommendations  Skilled nursing-short term rehab (<3 hours/day)     Assistance Recommended at Discharge    Patient can return home with the following A lot of help with bathing/dressing/bathroom;Two people to help with walking and/or transfers;Assistance with cooking/housework;Assist for transportation;Help with stairs or ramp for entrance   Equipment Recommendations  None recommended by PT    Recommendations for Other Services       Precautions / Restrictions Precautions Precautions: Fall Precaution Comments: on 4 L at home, L eye blind     Mobility  Bed Mobility Overal bed mobility: Needs Assistance         Sit to supine: Max assist   General bed mobility comments: assist for  controlled return to bed and LEs onto bed, pt very fatigued from ambulating to/from bathroom    Transfers Overall transfer level: Needs assistance Equipment used: 2 person hand held assist Transfers: Sit to/from Stand Sit to Stand: Mod assist           General transfer comment: cues for safety and hand placement.  stood at EOB to reposition    Ambulation/Gait                   Stairs             Wheelchair Mobility    Modified Rankin (Stroke Patients Only)       Balance Overall balance assessment: Needs assistance, History of Falls         Standing balance support: Reliant on assistive device for balance, During functional activity, Bilateral upper extremity supported Standing balance-Leahy Scale: Poor                              Cognition Arousal/Alertness: Awake/alert Behavior During Therapy: Flat affect Overall Cognitive Status: Within Functional Limits for tasks assessed                                          Exercises      General Comments        Pertinent Vitals/Pain Pain Assessment Pain Assessment: Faces Faces Pain Scale: Hurts little more Pain Location: right  and middle of chest Pain Descriptors / Indicators: Discomfort,  Grimacing, Guarding Pain Intervention(s): Repositioned, Monitored during session    Home Living                          Prior Function            PT Goals (current goals can now be found in the care plan section) Progress towards PT goals: Progressing toward goals    Frequency    Min 2X/week      PT Plan Current plan remains appropriate    Co-evaluation              AM-PAC PT "6 Clicks" Mobility   Outcome Measure  Help needed turning from your back to your side while in a flat bed without using bedrails?: A Lot Help needed moving from lying on your back to sitting on the side of a flat bed without using bedrails?: A Lot Help needed moving to  and from a bed to a chair (including a wheelchair)?: A Lot Help needed standing up from a chair using your arms (e.g., wheelchair or bedside chair)?: A Lot Help needed to walk in hospital room?: Total Help needed climbing 3-5 steps with a railing? : Total 6 Click Score: 10    End of Session Equipment Utilized During Treatment: Gait belt Activity Tolerance: Patient limited by fatigue Patient left: in bed;with call bell/phone within reach;with bed alarm set;with family/visitor present   PT Visit Diagnosis: Muscle weakness (generalized) (M62.81);Difficulty in walking, not elsewhere classified (R26.2)     Time: 0786-7544 PT Time Calculation (min) (ACUTE ONLY): 12 min  Charges:  $Therapeutic Activity: 8-22 mins                    Jannette Spanner PT, DPT Acute Rehabilitation Services Pager: [phone] Office: [place] 04/01/2021, 4:16 PM

## 2021-04-01 NOTE — Progress Notes (Signed)
Noted heart rate/rhythm sustained at 150's to 160 Aflutter- Pt is asymptomatic. NP made aware.

## 2021-04-02 ENCOUNTER — Inpatient Hospital Stay: Payer: Medicare Other

## 2021-04-02 LAB — GLUCOSE, CAPILLARY
Glucose-Capillary: 142 mg/dL — ABNORMAL HIGH (ref 70–99)
Glucose-Capillary: 168 mg/dL — ABNORMAL HIGH (ref 70–99)
Glucose-Capillary: 124 mg/dL — ABNORMAL HIGH (ref 70–99)
Glucose-Capillary: 157 mg/dL — ABNORMAL HIGH (ref 70–99)

## 2021-04-02 LAB — RESP PANEL BY RT-PCR (FLU A&B, COVID) ARPGX2
Influenza A by PCR: NEGATIVE
Influenza B by PCR: NEGATIVE
SARS Coronavirus 2 by RT PCR: NEGATIVE

## 2021-04-02 MED ORDER — METOPROLOL TARTRATE 5 MG/5ML IV SOLN
5.0000 mg | Freq: Once | INTRAVENOUS | Status: AC | PRN
  Administered 2021-04-03: 5 mg via INTRAVENOUS
  Filled 2021-04-02: qty 5

## 2021-04-02 MED ORDER — SODIUM CHLORIDE 0.9 % IV BOLUS
250.0000 mL | Freq: Once | INTRAVENOUS | Status: AC
  Administered 2021-04-02: 250 mL via INTRAVENOUS

## 2021-04-02 MED ORDER — SODIUM CHLORIDE 0.9 % IV BOLUS
500.0000 mL | Freq: Once | INTRAVENOUS | Status: AC
  Administered 2021-04-02: 500 mL via INTRAVENOUS

## 2021-04-02 NOTE — Progress Notes (Signed)
PT demonstrated hands on understanding of Flutter device- PC at this time (white, clear, thin frothy) at this time.

## 2021-04-02 NOTE — TOC Progression Note (Addendum)
Transition of Care [facility]) - Progression Note    Patient Details  Name: James [name] MRN: [id number] Date of Birth: Feb 15, 1946  [facility]) CM/SW Contact  Doreena Maulden, Juliann Pulse, RN Phone Number: 04/02/2021, 1:30 PM  Clinical Narrative:  Await choice from spouse.  -2:36p-Lucy spouse chose [facility] aware of d/c in am. Covid ordered.    Expected Discharge Plan: Skilled Nursing Facility Barriers to Discharge: Continued Medical Work up  Expected Discharge Plan and Services Expected Discharge Plan: [place]                                               Social Determinants of Health (SDOH) Interventions    Readmission Risk Interventions No flowsheet data found.

## 2021-04-02 NOTE — Progress Notes (Signed)
[facility]  PROGRESS NOTE  James [name] ION:[id number] DOB: 03-Feb-1946 DOA: 03/29/2021 PCP: [name], MD   Brief HPI:   76 year old male with medical history of stage IIIa non-small cell lung cancer, recently completed radiation, chemotherapy held due to functional decline, COPD, chronic respiratory failure with hypoxia on 4 L supplemental oxygen via nasal cannula at home, paroxysmal atrial flutter on Eliquis, Parkinson's disease, diabetes mellitus type 2, hypertension, hyperlipidemia, iron deficiency anemia, depression who presented to ED with complaints of generalized weakness.  Patient reported 1 month of progressive generalized weakness with frequent falls.  Also reported chronic diarrhea with chronic shortness of breath with cough productive of clear phlegm.  In the ED UA showed more than 50 RBC per high-power field, 11-20 WBC/hpf, SARS-CoV-2 and influenza PCR negative  Portable chest x-ray shows unchanged left pleural effusion and airspace opacities throughout the left lung with volume loss compatible with postobstructive changes related to the left perihilar mass demonstrated on prior chest CT and PET/CT.  No new findings seen.   CT head without contrast negative for acute intracranial abnormality.  Stable senescent changes and stable mild ventriculomegaly noted.    Subjective   Patient seen and examined, went into A-fib with RVR yesterday.  Improved after he was given 250 cc normal saline bolus.  Denies chest pain or shortness of breath today.   Assessment/Plan:    Generalized weakness/failure to thrive -Presented with generalized weakness due to cancer, recent chemoradiation therapy -Also has Parkinson disease causing recurrent falls -P.o. intake is slowly improving -He is off IV fluids now -PT/OT consulted -Plan to go to skilled nursing facility -Fall precautions  COPD/chronic respiratory failure -Patient on 4 L/min of oxygen via [place] -Continue albuterol  nebulizer and as needed albuterol  Bacteriuria -UA shows many bacteria -Urine culture showed multiple growth -He was given ceftriaxone in the ED -Not on antibiotics at this time  Hypokalemia/hypomagnesemia -Replete  Paroxysmal atrial flutter -Continue diltiazem 300 mg p.o. daily, Lopressor 25 mg twice daily -Continue anticoagulation with Eliquis  Diabetes mellitus type 2 -Metformin on hold -Continue sliding scale insulin NovoLog -CBG well controlled  Primary cancer of left lower lung -Follows oncology Dr. Julien Nordmann -Recently completed partial radiation therapy -Last 2 cycles of chemotherapy canceled due to functional decline  Iron-deficiency anemia -Stable  Parkinson disease -Continue Sinemet  Thyroid nodule -2.1 cm nodule on thyroid shown on ultrasound 12/22 last admission -Patient says that he is followed with endocrinology at that point repeat thyroid levels were reassuring -Further work-up was not recommended  Depression -Continue Wellbutrin, mirtazapine, sertraline        Medications     apixaban  5 mg Oral BID   arformoterol  15 mcg Nebulization Q12H   atorvastatin  20 mg Oral QHS   Carbidopa-Levodopa ER  1 tablet Oral BID   diltiazem  300 mg Oral Daily   donepezil  10 mg Oral QHS   feeding supplement  1 Container Oral TID BM   ferrous gluconate  324 mg Oral Q breakfast   insulin aspart  0-9 Units Subcutaneous TID WC   metoprolol tartrate  25 mg Oral BID   mirtazapine  15 mg Oral QHS   modafinil  100 mg Oral Daily   multivitamin with minerals  1 tablet Oral Daily   pantoprazole  40 mg Oral QAC supper   sertraline  150 mg Oral Daily   sodium chloride flush  3 mL Intravenous Q12H     Data Reviewed:   CBG:  Recent Labs  Lab 04/01/21 0732 04/01/21 1206 04/01/21 1705 04/01/21 2105 04/02/21 0732  GLUCAP 131* 148* 254* 103* 142*    SpO2: 95 % O2 Flow Rate (L/min): 4 L/min    Vitals:   04/02/21 0732 04/02/21 0826 04/02/21 0930 04/02/21  1030  BP:  117/78  113/66  Pulse:  (!) 159 89 82  Resp:    20  Temp:    98.1 F (36.7 C)  TempSrc:    Oral  SpO2: 95%   95%  Weight:      Height:          Data Reviewed:  Basic Metabolic Panel: Recent Labs  Lab 03/27/21 0915 03/29/21 1718 03/29/21 1805 03/30/21 0530 03/31/21 0802 04/01/21 1002  NA 142 139 143 135 137 139  K 3.2* 3.2* 3.5 3.2* 4.2 4.1  CL 105 105 103 100 101 101  CO2 27 27  --  25 29 31   GLUCOSE 180* 97 89 91 106* 144*  BUN 17 16 13 11 8 9   CREATININE 0.94 0.85 0.70 0.69 0.63 0.66  CALCIUM 9.4 8.7*  --  8.0* 8.3* 8.5*  MG  --   --   --  1.3* 2.0  --     CBC: Recent Labs  Lab 03/27/21 0915 03/29/21 1718 03/29/21 1805 03/30/21 0530 03/31/21 0802  WBC 5.2 7.5  --  5.7 6.1  NEUTROABS 4.1 6.3  --   --   --   HGB 9.8* 9.4* 9.5* 9.1* 9.5*  HCT 31.2* 31.2* 28.0* 30.1* 31.8*  MCV 79.0* 82.8  --  82.9 84.4  PLT 223 209  --  172 179       Antibiotics: Anti-infectives (From admission, onward)    Start     Dose/Rate Route Frequency Ordered Stop   03/30/21 2100  cefTRIAXone (ROCEPHIN) 1 g in sodium chloride 0.9 % 100 mL IVPB  Status:  Discontinued        1 g 200 mL/hr over 30 Minutes Intravenous Every 24 hours 03/29/21 2330 03/29/21 2341   03/29/21 2130  cefTRIAXone (ROCEPHIN) 1 g in sodium chloride 0.9 % 100 mL IVPB        1 g 200 mL/hr over 30 Minutes Intravenous  Once 03/29/21 2120 03/29/21 2221   03/29/21 0000  cefUROXime (CEFTIN) 250 MG tablet        250 mg Oral 2 times daily with meals 03/29/21 2126 04/05/21 2359        DVT prophylaxis: Apixaban  Code Status: Full code  Family Communication: No family at bedside      Objective    Physical Examination:  General-appears in no acute distress Heart-S1-S2, irregular, no murmur auscultated Lungs-clear to auscultation bilaterally, no wheezing or crackles auscultated Abdomen-soft, nontender, no organomegaly Extremities-no edema in the lower extremities Neuro-alert, oriented  x3, no focal deficit noted  Status is: Inpatient failure to thrive    Pressure Injury 03/30/21 Coccyx Mid Stage 2 -  Partial thickness loss of dermis presenting as a shallow open injury with a red, pink wound bed without slough. (Active)  03/30/21 1703  Location: Coccyx  Location Orientation: Mid  Staging: Stage 2 -  Partial thickness loss of dermis presenting as a shallow open injury with a red, pink wound bed without slough.  Wound Description (Comments):   Present on Admission: Yes     [place]   [facility] If 7PM-7AM, please contact night-coverage at [email or web address], Office  [phone]   04/02/2021, 12:17 PM  LOS:  2 days

## 2021-04-02 NOTE — Progress Notes (Signed)
°   04/02/21 2055  Vitals  Temp 98.8 F (37.1 C)  Temp Source Oral  BP 122/77  MAP (mmHg) 91  BP Location Left Arm  BP Method Automatic  Patient Position (if appropriate) Lying  Pulse Rate (!) 170  Pulse Rate Source Monitor  Resp 20  MEWS COLOR  MEWS Score Color Yellow  Oxygen Therapy  SpO2 96 %  O2 Device HFNC  O2 Flow Rate (L/min) 5 L/min  MEWS Score  MEWS Temp 0  MEWS Systolic 0  MEWS Pulse 3  MEWS RR 0  MEWS LOC 0  MEWS Score 3  Provider Notification  Provider Name/Title Gershon Cull, NP  Date Provider Notified 04/02/21  Time Provider Notified 2058  Notification Type Page (Secure chat)  Notification Reason Other (Comment) (HR sustaining to 160-170)  Provider response See new orders  Date of Provider Response 04/02/21  Time of Provider Response 2100     04/02/21 2055  Vitals  Temp 98.8 F (37.1 C)  Temp Source Oral  BP 122/77  MAP (mmHg) 91  BP Location Left Arm  BP Method Automatic  Patient Position (if appropriate) Lying  Pulse Rate (!) 170  Pulse Rate Source Monitor  Resp 20  MEWS COLOR  MEWS Score Color Yellow  Oxygen Therapy  SpO2 96 %  O2 Device HFNC  O2 Flow Rate (L/min) 5 L/min  MEWS Score  MEWS Temp 0  MEWS Systolic 0  MEWS Pulse 3  MEWS RR 0  MEWS LOC 0  MEWS Score 3  Provider Notification  Provider Name/Title Gershon Cull, NP  Date Provider Notified 04/02/21  Time Provider Notified 2058  Notification Type Page (Secure chat)  Notification Reason Other (Comment) (HR sustaining to 160-170)  Provider response See new orders  Date of Provider Response 04/02/21  Time of Provider Response 2100   Patient alert, no s/s of respiratory distress, given PRN pain med and will continue plan of care.

## 2021-04-02 NOTE — Progress Notes (Signed)
°   04/02/21 0826  Assess: MEWS Score  BP 117/78  Pulse Rate (!) 159  Assess: MEWS Score  MEWS Temp 0  MEWS Systolic 0  MEWS Pulse 3  MEWS RR 0  MEWS LOC 0  MEWS Score 3  MEWS Score Color Yellow  Assess: if the MEWS score is Yellow or Red  Were vital signs taken at a resting state? Yes  Focused Assessment No change from prior assessment  Does the patient meet 2 or more of the SIRS criteria? No  MEWS guidelines implemented *See Row Information* Yes  Treat  MEWS Interventions Administered scheduled meds/treatments  Take Vital Signs  Increase Vital Sign Frequency  Yellow: Q 2hr X 2 then Q 4hr X 2, if remains yellow, continue Q 4hrs  Escalate  MEWS: Escalate Yellow: discuss with charge nurse/RN and consider discussing with provider and RRT  Notify: Charge Nurse/RN  Name of Charge Nurse/RN Notified Charge  Date Charge Nurse/RN Notified 04/02/21  Time Charge Nurse/RN Notified 1100  Notify: Provider  Provider Name/Title [place]  Date Provider Notified 04/02/21  Time Provider Notified [phone]  Notification Type Face-to-face  Notification Reason Critical result  Provider response No new orders  Date of Provider Response 04/02/21  Time of Provider Response [phone]  Document  Patient Outcome Stabilized after interventions (HR has come down)  Progress note created (see row info) Yes  Assess: SIRS CRITERIA  SIRS Temperature  0  SIRS Pulse 1  SIRS Respirations  0  SIRS WBC 0  SIRS Score Sum  1

## 2021-04-03 DIAGNOSIS — C3492 Malignant neoplasm of unspecified part of left bronchus or lung: Secondary | ICD-10-CM | POA: Diagnosis not present

## 2021-04-03 DIAGNOSIS — R41841 Cognitive communication deficit: Secondary | ICD-10-CM | POA: Diagnosis not present

## 2021-04-03 DIAGNOSIS — R1312 Dysphagia, oropharyngeal phase: Secondary | ICD-10-CM | POA: Diagnosis not present

## 2021-04-03 DIAGNOSIS — J439 Emphysema, unspecified: Secondary | ICD-10-CM | POA: Diagnosis not present

## 2021-04-03 DIAGNOSIS — R8271 Bacteriuria: Secondary | ICD-10-CM | POA: Diagnosis not present

## 2021-04-03 DIAGNOSIS — Z7189 Other specified counseling: Secondary | ICD-10-CM | POA: Diagnosis not present

## 2021-04-03 DIAGNOSIS — F32A Depression, unspecified: Secondary | ICD-10-CM | POA: Diagnosis not present

## 2021-04-03 DIAGNOSIS — D509 Iron deficiency anemia, unspecified: Secondary | ICD-10-CM | POA: Diagnosis not present

## 2021-04-03 DIAGNOSIS — Z9221 Personal history of antineoplastic chemotherapy: Secondary | ICD-10-CM | POA: Diagnosis not present

## 2021-04-03 DIAGNOSIS — J918 Pleural effusion in other conditions classified elsewhere: Secondary | ICD-10-CM | POA: Diagnosis not present

## 2021-04-03 DIAGNOSIS — Z711 Person with feared health complaint in whom no diagnosis is made: Secondary | ICD-10-CM | POA: Diagnosis not present

## 2021-04-03 DIAGNOSIS — J9611 Chronic respiratory failure with hypoxia: Secondary | ICD-10-CM | POA: Diagnosis not present

## 2021-04-03 DIAGNOSIS — Z789 Other specified health status: Secondary | ICD-10-CM | POA: Diagnosis not present

## 2021-04-03 DIAGNOSIS — E440 Moderate protein-calorie malnutrition: Secondary | ICD-10-CM | POA: Diagnosis not present

## 2021-04-03 DIAGNOSIS — G20 Parkinson's disease: Secondary | ICD-10-CM | POA: Diagnosis not present

## 2021-04-03 DIAGNOSIS — E46 Unspecified protein-calorie malnutrition: Secondary | ICD-10-CM | POA: Diagnosis not present

## 2021-04-03 DIAGNOSIS — Z8249 Family history of ischemic heart disease and other diseases of the circulatory system: Secondary | ICD-10-CM | POA: Diagnosis not present

## 2021-04-03 DIAGNOSIS — F331 Major depressive disorder, recurrent, moderate: Secondary | ICD-10-CM | POA: Diagnosis not present

## 2021-04-03 DIAGNOSIS — E860 Dehydration: Secondary | ICD-10-CM | POA: Diagnosis not present

## 2021-04-03 DIAGNOSIS — J9811 Atelectasis: Secondary | ICD-10-CM | POA: Diagnosis not present

## 2021-04-03 DIAGNOSIS — J449 Chronic obstructive pulmonary disease, unspecified: Secondary | ICD-10-CM | POA: Diagnosis not present

## 2021-04-03 DIAGNOSIS — Z923 Personal history of irradiation: Secondary | ICD-10-CM | POA: Diagnosis not present

## 2021-04-03 DIAGNOSIS — Z6824 Body mass index (BMI) 24.0-24.9, adult: Secondary | ICD-10-CM | POA: Diagnosis not present

## 2021-04-03 DIAGNOSIS — J90 Pleural effusion, not elsewhere classified: Secondary | ICD-10-CM | POA: Diagnosis not present

## 2021-04-03 DIAGNOSIS — Z87891 Personal history of nicotine dependence: Secondary | ICD-10-CM | POA: Diagnosis not present

## 2021-04-03 DIAGNOSIS — Z7984 Long term (current) use of oral hypoglycemic drugs: Secondary | ICD-10-CM | POA: Diagnosis not present

## 2021-04-03 DIAGNOSIS — I4892 Unspecified atrial flutter: Secondary | ICD-10-CM | POA: Diagnosis not present

## 2021-04-03 DIAGNOSIS — Z7951 Long term (current) use of inhaled steroids: Secondary | ICD-10-CM | POA: Diagnosis not present

## 2021-04-03 DIAGNOSIS — M6259 Muscle wasting and atrophy, not elsewhere classified, multiple sites: Secondary | ICD-10-CM | POA: Diagnosis not present

## 2021-04-03 DIAGNOSIS — Z515 Encounter for palliative care: Secondary | ICD-10-CM | POA: Diagnosis not present

## 2021-04-03 DIAGNOSIS — E1169 Type 2 diabetes mellitus with other specified complication: Secondary | ICD-10-CM | POA: Diagnosis not present

## 2021-04-03 DIAGNOSIS — R278 Other lack of coordination: Secondary | ICD-10-CM | POA: Diagnosis not present

## 2021-04-03 DIAGNOSIS — R531 Weakness: Secondary | ICD-10-CM | POA: Diagnosis not present

## 2021-04-03 DIAGNOSIS — E119 Type 2 diabetes mellitus without complications: Secondary | ICD-10-CM | POA: Diagnosis not present

## 2021-04-03 DIAGNOSIS — E43 Unspecified severe protein-calorie malnutrition: Secondary | ICD-10-CM | POA: Diagnosis not present

## 2021-04-03 DIAGNOSIS — Z825 Family history of asthma and other chronic lower respiratory diseases: Secondary | ICD-10-CM | POA: Diagnosis not present

## 2021-04-03 DIAGNOSIS — Z7401 Bed confinement status: Secondary | ICD-10-CM | POA: Diagnosis not present

## 2021-04-03 DIAGNOSIS — J9610 Chronic respiratory failure, unspecified whether with hypoxia or hypercapnia: Secondary | ICD-10-CM | POA: Diagnosis not present

## 2021-04-03 DIAGNOSIS — F4320 Adjustment disorder, unspecified: Secondary | ICD-10-CM | POA: Diagnosis not present

## 2021-04-03 DIAGNOSIS — E118 Type 2 diabetes mellitus with unspecified complications: Secondary | ICD-10-CM | POA: Diagnosis not present

## 2021-04-03 DIAGNOSIS — F418 Other specified anxiety disorders: Secondary | ICD-10-CM | POA: Diagnosis not present

## 2021-04-03 DIAGNOSIS — E876 Hypokalemia: Secondary | ICD-10-CM | POA: Diagnosis not present

## 2021-04-03 DIAGNOSIS — E611 Iron deficiency: Secondary | ICD-10-CM | POA: Diagnosis not present

## 2021-04-03 DIAGNOSIS — R262 Difficulty in walking, not elsewhere classified: Secondary | ICD-10-CM | POA: Diagnosis not present

## 2021-04-03 DIAGNOSIS — R4189 Other symptoms and signs involving cognitive functions and awareness: Secondary | ICD-10-CM | POA: Diagnosis not present

## 2021-04-03 DIAGNOSIS — Z79899 Other long term (current) drug therapy: Secondary | ICD-10-CM | POA: Diagnosis not present

## 2021-04-03 DIAGNOSIS — R627 Adult failure to thrive: Secondary | ICD-10-CM | POA: Diagnosis not present

## 2021-04-03 DIAGNOSIS — D500 Iron deficiency anemia secondary to blood loss (chronic): Secondary | ICD-10-CM | POA: Diagnosis not present

## 2021-04-03 DIAGNOSIS — Z8616 Personal history of COVID-19: Secondary | ICD-10-CM | POA: Diagnosis not present

## 2021-04-03 DIAGNOSIS — R5381 Other malaise: Secondary | ICD-10-CM | POA: Diagnosis not present

## 2021-04-03 DIAGNOSIS — I10 Essential (primary) hypertension: Secondary | ICD-10-CM | POA: Diagnosis not present

## 2021-04-03 DIAGNOSIS — R197 Diarrhea, unspecified: Secondary | ICD-10-CM | POA: Diagnosis not present

## 2021-04-03 DIAGNOSIS — R000 Tachycardia, unspecified: Secondary | ICD-10-CM | POA: Diagnosis not present

## 2021-04-03 DIAGNOSIS — Z7901 Long term (current) use of anticoagulants: Secondary | ICD-10-CM | POA: Diagnosis not present

## 2021-04-03 DIAGNOSIS — I4891 Unspecified atrial fibrillation: Secondary | ICD-10-CM | POA: Diagnosis not present

## 2021-04-03 DIAGNOSIS — M6281 Muscle weakness (generalized): Secondary | ICD-10-CM | POA: Diagnosis not present

## 2021-04-03 DIAGNOSIS — E041 Nontoxic single thyroid nodule: Secondary | ICD-10-CM | POA: Diagnosis not present

## 2021-04-03 DIAGNOSIS — C3432 Malignant neoplasm of lower lobe, left bronchus or lung: Secondary | ICD-10-CM | POA: Diagnosis not present

## 2021-04-03 DIAGNOSIS — E785 Hyperlipidemia, unspecified: Secondary | ICD-10-CM | POA: Diagnosis not present

## 2021-04-03 DIAGNOSIS — Z809 Family history of malignant neoplasm, unspecified: Secondary | ICD-10-CM | POA: Diagnosis not present

## 2021-04-03 LAB — GLUCOSE, CAPILLARY
Glucose-Capillary: 133 mg/dL — ABNORMAL HIGH (ref 70–99)
Glucose-Capillary: 123 mg/dL — ABNORMAL HIGH (ref 70–99)

## 2021-04-03 MED ORDER — ACETAMINOPHEN 325 MG PO TABS: 650.0000 mg | ORAL_TABLET | Freq: Four times a day (QID) | ORAL | Status: AC | PRN

## 2021-04-03 MED ORDER — LEVALBUTEROL HCL 0.63 MG/3ML IN NEBU: 0.6300 mg | INHALATION_SOLUTION | Freq: Four times a day (QID) | RESPIRATORY_TRACT | Status: DC | PRN

## 2021-04-03 MED ORDER — METOPROLOL TARTRATE 25 MG PO TABS
37.5000 mg | ORAL_TABLET | Freq: Two times a day (BID) | ORAL | Status: DC
  Administered 2021-04-03: 37.5 mg via ORAL
  Filled 2021-04-03: qty 2

## 2021-04-03 NOTE — Progress Notes (Signed)
Pt HR sustaining in the 160s at rest. Pt asymptomatic, no reports of pain or SOB. BP stable 149/77. PRN IV metoprolol 5mg  given. HR now sustaining in the 90s A flutter MD made aware. Will continue to monitor.

## 2021-04-03 NOTE — TOC Progression Note (Addendum)
Transition of Care [facility]) - Progression Note    Patient Details  Name: James [name] MRN: [id number] Date of Birth: 1945-04-19  [facility]) CM/SW Contact  Leeroy Cha, RN Phone Number: 04/03/2021, 10:23 AM  Clinical Narrative:    James [name] is discharged to go to [facility].  Start notified at [facility] and room number and report number requested. Per star at [facility] room number is 103P, call report to [phone] Transfer packet completed and p[laced with unit clerk.  Ptar called for transport at 1200 Expected Discharge Plan: [place] Barriers to Discharge: Continued Medical Work up  Expected Discharge Plan and Services Expected Discharge Plan: [place]         Expected Discharge Date: 04/03/21                                     Social Determinants of Health (SDOH) Interventions    Readmission Risk Interventions No flowsheet data found.

## 2021-04-03 NOTE — Discharge Summary (Signed)
Physician Discharge Summary  James James [name] MBW:[id number] DOB: November 27, 1945 DOA: 03/29/2021  PCP: [name], MD  Admit date: 03/29/2021 Discharge date: 04/03/2021  Time spent: 40 minutes   Discharge Diagnoses:  Principal Problem:   Dehydration Active Problems:   COPD with emphysema ([place])   Primary cancer of left lower lobe of lung ([place])   Iron deficiency anemia   Paroxysmal atrial flutter ([place])   Hyperlipidemia associated with type 2 diabetes mellitus ([place])   Diabetes mellitus, type 2 (HCC)   Parkinson's disease ([place])   Depression   Chronic respiratory failure with hypoxia (HCC)   Thyroid nodule   Bacteriuria   Generalized weakness   Pressure injury of skin   Failure to thrive in adult   Malnutrition of moderate degree   Discharge Condition: Stable   Filed Weights   04/01/21 0531 04/02/21 0431 04/03/21 0500  Weight: 86.4 kg 87 kg 85.8 kg    History of present illness:  76 year old James [name] with medical history of stage IIIa non-small cell lung cancer, recently completed radiation, chemotherapy held due to functional decline, COPD, chronic respiratory failure with hypoxia on 4 L supplemental oxygen via nasal cannula at home, paroxysmal atrial flutter on Eliquis, Parkinson's disease, diabetes mellitus type 2, hypertension, hyperlipidemia, iron deficiency anemia, depression who presented to ED with complaints of generalized weakness.   Patient reported 1 month of progressive generalized weakness with frequent falls.  Also reported chronic diarrhea with chronic shortness of breath with cough productive of clear phlegm.   In the ED UA showed more than 50 RBC per high-power field, 11-20 WBC/hpf, SARS-CoV-2 and influenza PCR negative   Portable chest x-ray shows unchanged left pleural effusion and airspace opacities throughout the left lung with volume loss compatible with postobstructive changes related to the left perihilar mass demonstrated on prior chest CT and PET/CT.  No new  findings seen.   CT head without contrast negative for acute intracranial abnormality.  Stable senescent changes and stable mild ventriculomegaly noted.  Patient was provided IV fluids.  Physical therapy recommending short-term rehab.  Appears he is not been tolerating treatment for his cancer.  Consider outpatient palliative care consultation as patient will highly likely continue to decline until goals of care decided.    Hospital Course:  Generalized weakness/failure to thrive -Presented with generalized weakness due to cancer, recent chemoradiation therapy -Also has Parkinson disease causing recurrent falls -P.o. intake is slowly improving   COPD/chronic respiratory failure -Patient on 4 L/min of oxygen via [place] -Change albuterol to Xopenex  Bacteriuria -UA shows many bacteria -Urine culture showed multiple growth -He was given ceftriaxone in the ED -Not on antibiotics at this time   Hypokalemia/hypomagnesemia -Replete as needed   Paroxysmal atrial flutter -Continue diltiazem 300 mg p.o. daily, Lopressor 25 mg twice daily -Continue anticoagulation with Eliquis -Continue to adjust as needed to keep his heart rate less than 90 and above 50   Diabetes mellitus type 2 -Resume home meds  Primary cancer of left lower lung -Follows oncology Dr. Julien Nordmann -Recently completed partial radiation therapy -Last 2 cycles of chemotherapy canceled due to functional decline   Iron-deficiency anemia -Stable   Parkinson disease -Continue Sinemet   Thyroid nodule -2.1 cm nodule on thyroid shown on ultrasound 12/22 last admission -Patient says that he is followed with endocrinology at that point repeat thyroid levels were reassuring -Further work-up was not recommended   Depression -Continue Wellbutrin, mirtazapine, sertraline   Consider outpatient palliative care consult  Discharge Exam: Vitals:  04/03/21 0821 04/03/21 0901  BP:    Pulse:  (!) 160  Resp:    Temp:    SpO2:  93%     General: Alert and oriented no apparent distress Cardiovascular: Irregular rhythm regular rate no murmurs rubs or gallops Respiratory: Clear to auscultation bilaterally no wheezes rhonchi or rales  Discharge Instructions   Discharge Instructions     Diet - low sodium heart healthy   Complete by: As directed    Discharge instructions   Complete by: As directed    Follow-up with primary care physician in 1 to 2 weeks  Follow-up with oncologist as previously scheduled   Increase activity slowly   Complete by: As directed    No wound care   Complete by: As directed       Allergies as of 04/03/2021       Reactions   Aspirin Other (See Comments)   GI bleed   Codeine Itching   Morphine Itching, Nausea Only        Medication List     STOP taking these medications    HYDROcodone-acetaminophen 5-325 MG tablet Commonly known as: Norco   potassium chloride SA 20 MEQ tablet Commonly known as: KLOR-CON M   revefenacin 175 MCG/3ML nebulizer solution Commonly known as: YUPELRI   traMADol 50 MG tablet Commonly known as: ULTRAM       TAKE these medications    acetaminophen 325 MG tablet Commonly known as: TYLENOL Take 2 tablets (650 mg total) by mouth every 6 (six) hours as needed for mild pain (or Fever >/= 101).   albuterol 108 (90 Base) MCG/ACT inhaler Commonly known as: VENTOLIN HFA Inhale 2 puffs into the lungs every 6 (six) hours as needed for wheezing or shortness of breath.   apixaban 5 MG Tabs tablet Commonly known as: ELIQUIS Take 1 tablet (5 mg total) by mouth 2 (two) times daily.   atorvastatin 20 MG tablet Commonly known as: LIPITOR Take 20 mg by mouth at bedtime.   buPROPion 150 MG 24 hr tablet Commonly known as: WELLBUTRIN XL Take 150 mg by mouth in the morning and at bedtime.   calcium-vitamin D 250-125 MG-UNIT tablet Commonly known as: OSCAL WITH D Take 1 tablet by mouth daily.   Carbidopa-Levodopa ER 25-100 MG tablet controlled  release Commonly known as: SINEMET CR Take 1.5 tablets by mouth in the morning, at noon, and at bedtime. Take at 0800, 1400 & 2200   cefUROXime 250 MG tablet Commonly known as: CEFTIN Take 1 tablet (250 mg total) by mouth 2 (two) times daily with a meal for 7 days.   cetirizine 10 MG tablet Commonly known as: ZYRTEC Take 10 mg by mouth 2 (two) times daily.   diltiazem 300 MG 24 hr capsule Commonly known as: CARDIZEM CD Take 1 capsule (300 mg total) by mouth daily.   donepezil 10 MG tablet Commonly known as: ARICEPT Take 1 tablet (10 mg total) by mouth at bedtime.   ferrous gluconate 324 MG tablet Commonly known as: FERGON TAKE 1 TABLET BY MOUTH DAILY WITH BREAKFAST   formoterol 20 MCG/2ML nebulizer solution Commonly known as: PERFOROMIST Take 2 mLs (20 mcg total) by nebulization 2 (two) times daily.   Green Tea 315 MG Caps Take 315 mg by mouth daily.   ibuprofen 800 MG tablet Commonly known as: ADVIL Take 1 tablet (800 mg total) by mouth every 8 (eight) hours as needed for moderate pain.   lidocaine-prilocaine cream Commonly known as: EMLA  Apply 1 application topically as needed. What changed: reasons to take this   MEGARED OMEGA-3 KRILL OIL PO Take 1 capsule by mouth daily.   metFORMIN 500 MG tablet Commonly known as: GLUCOPHAGE Take 1 tablet (500 mg total) by mouth 2 (two) times daily with a meal.   metoprolol tartrate 25 MG tablet Commonly known as: LOPRESSOR Take 1 tablet (25 mg total) by mouth 2 (two) times daily. Hold if heart rate less than 60 or blood pressure top number is less than 95   mirtazapine 15 MG tablet Commonly known as: REMERON Take 15 mg by mouth at bedtime.   modafinil 100 MG tablet Commonly known as: PROVIGIL Take 1 tablet (100 mg total) by mouth daily.   multivitamin with minerals tablet Take 1 tablet by mouth daily. Centrum What changed: Another medication with the same name was removed. Continue taking this medication, and follow  the directions you see here.   nitroGLYCERIN 0.4 MG SL tablet Commonly known as: NITROSTAT Place 0.4 mg under the tongue every 5 (five) minutes as needed for chest pain. What changed: Another medication with the same name was removed. Continue taking this medication, and follow the directions you see here.   omeprazole 20 MG tablet Commonly known as: PRILOSEC OTC Take 20 mg by mouth every evening.   OXYGEN Inhale 4-6 L into the lungs continuous.   prochlorperazine 10 MG tablet Commonly known as: COMPAZINE Take 1 tablet (10 mg total) by mouth every 6 (six) hours as needed for nausea or vomiting.   sertraline 100 MG tablet Commonly known as: ZOLOFT Take 150 mg by mouth daily.   vitamin C 500 MG tablet Commonly known as: ASCORBIC ACID Take 500 mg by mouth daily.       Allergies  Allergen Reactions   Aspirin Other (See Comments)    GI bleed   Codeine Itching   Morphine Itching and Nausea Only    Contact information for after-discharge care     Destination     [facility] .   Service: Skilled Nursing Contact information: [place] [phone]                      The results of significant diagnostics from this hospitalization (including imaging, microbiology, ancillary and laboratory) are listed below for reference.    Significant Diagnostic Studies: CT HEAD WO CONTRAST  Result Date: 03/29/2021 CLINICAL DATA:  Altered mental status EXAM: CT HEAD WITHOUT CONTRAST TECHNIQUE: Contiguous axial images were obtained from the base of the skull through the vertex without intravenous contrast. RADIATION DOSE REDUCTION: This exam was performed according to the departmental dose-optimization program which includes automated exposure control, adjustment of the mA and/or kV according to patient size and/or use of iterative reconstruction technique. COMPARISON:  MRI 02/01/2021, CT 06/17/2019 FINDINGS: Brain:  Normal anatomic configuration. Parenchymal volume loss is commensurate with the patient's age. Mild periventricular white matter changes are present likely reflecting the sequela of small vessel ischemia. No abnormal intra or extra-axial mass lesion or fluid collection. No abnormal mass effect or midline shift. No evidence of acute intracranial hemorrhage or infarct. Mild ventriculomegaly is stable and appears commensurate with the degree of parenchymal volume loss. Cerebellum unremarkable. Vascular: Prominence of the M1 segment of the left MCA is stable and unchanged from prior MRI examination. Otherwise, no asymmetric hyperdense vasculature at the skull base. Skull: Intact Sinuses/Orbits: Paranasal sinuses are clear. Ocular lenses have been removed. Orbits  are otherwise unremarkable. Other: Mastoid air cells and middle ear cavities are clear. IMPRESSION: No acute intracranial abnormality. Stable senescent change.  Stable mild ventriculomegaly. Electronically Signed   By: Fidela Salisbury M.D.   On: 03/29/2021 19:02   DG Chest Portable 1 View  Result Date: 03/29/2021 CLINICAL DATA:  Generalized weakness. EXAM: PORTABLE CHEST 1 VIEW COMPARISON:  Chest x-rays dated 02/18/2021 and 02/11/2021. Chest CT dated 11/17/2020. PET-CT dated 12/26/2020. FINDINGS: Heart size and mediastinal contours are grossly stable. 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 RIGHT lung is clear. RIGHT chest wall Port-A-Cath in place with tip adequately positioned at the level of the mid/lower SVC. Osseous structures about the chest are unremarkable. IMPRESSION: 1. No significant change compared to the chest x-ray of 02/18/2021. Again noted is a LEFT pleural effusion and airspace opacities throughout the LEFT lung with volume loss, compatible with  the postobstructive changes related to the LEFT perihilar mass as demonstrated on earlier chest CT and PET-CT. 2. No new findings seen.  RIGHT lung is clear. Electronically Signed   By: Franki Cabot M.D.   On: 03/29/2021 18:37   IR IMAGING GUIDED PORT INSERTION  Result Date: 03/18/2021 INDICATION: 76 year old James [name] referred for port catheter EXAM: IMAGE GUIDED PORT CATHETER MEDICATIONS: None ANESTHESIA/SEDATION: Moderate (conscious) sedation was employed during this procedure. A total of Versed 1.0 mg and Fentanyl 50 mcg was administered intravenously. Moderate Sedation Time: 20 minutes. The patient's level of consciousness and vital signs were monitored continuously by radiology nursing throughout the procedure under my direct supervision. FLUOROSCOPY TIME:  Fluoroscopy Time: 0 minutes 18 seconds (2 mGy). COMPLICATIONS: None PROCEDURE: The procedure, risks, benefits, and alternatives were explained to the patient. Questions regarding the procedure were encouraged and answered. The patient understands and consents to the procedure. Ultrasound survey was performed with images stored and sent to PACs. Right IJ vein documented to be patent. The right neck and chest was prepped with chlorhexidine, and draped in the usual sterile fashion using maximum barrier technique (cap and mask, sterile gown, sterile gloves, large sterile sheet, hand hygiene and cutaneous antiseptic). Local anesthesia was attained by infiltration with 1% lidocaine without epinephrine. Ultrasound demonstrated patency of the right internal jugular vein, and this was documented with an image. Under real-time ultrasound guidance, this vein was accessed with a 21 gauge micropuncture needle and image documentation was performed. A small dermatotomy was made at the access site with an 11 scalpel. A 0.018" wire was advanced into the SVC and used to estimate the length of the internal catheter. The access needle exchanged for a 65F micropuncture vascular  sheath. The 0.018" wire was then removed and a 0.035" wire advanced into the IVC. An appropriate location for the subcutaneous reservoir was selected below the clavicle and an incision was made through the skin and underlying soft tissues. The subcutaneous tissues were then dissected using a combination of blunt and sharp surgical technique and a pocket was formed. A single lumen power injectable portacatheter was then tunneled through the subcutaneous tissues from the pocket to the dermatotomy and the port reservoir placed within the subcutaneous pocket. The venous access site was then serially dilated and a peel away vascular sheath placed over the wire. The wire was removed and the port catheter advanced into position under fluoroscopic guidance. The catheter tip is positioned  in the cavoatrial junction. This was documented with a spot image. The portacatheter was then tested and found to flush and aspirate well. The port was flushed with saline followed by 100 units/mL heparinized saline. The pocket was then closed in two layers using first subdermal inverted interrupted absorbable sutures followed by a running subcuticular suture. The epidermis was then sealed with Dermabond. The dermatotomy at the venous access site was also seal with Dermabond. Patient tolerated the procedure well and remained hemodynamically stable throughout. No complications encountered and no significant blood loss encountered IMPRESSION: Status post right IJ port catheter Signed, Dulcy Fanny. Dellia Nims, RPVI [facility] Electronically Signed   By: Corrie Mckusick D.O.   On: 03/18/2021 16:18    Microbiology: Recent Results (from the past 240 hour(s))  Resp Panel by RT-PCR (Flu A&B, Covid) Nasopharyngeal Swab     Status: None   Collection Time: 03/29/21  5:39 PM   Specimen: Nasopharyngeal Swab; Nasopharyngeal(NP) swabs in vial transport medium  Result Value Ref Range Status    SARS Coronavirus 2 by RT PCR NEGATIVE NEGATIVE Final    Comment: (NOTE) SARS-CoV-2 target nucleic acids are NOT DETECTED.  The SARS-CoV-2 RNA is generally detectable in upper respiratory specimens during the acute phase of infection. The lowest concentration of SARS-CoV-2 viral copies this assay can detect is 138 copies/mL. A negative result does not preclude SARS-Cov-2 infection and should not be used as the sole basis for treatment or other patient management decisions. A negative result may occur with  improper specimen collection/handling, submission of specimen other than nasopharyngeal swab, presence of viral mutation(s) within the areas targeted by this assay, and inadequate number of viral copies(<138 copies/mL). A negative result must be combined with clinical observations, patient history, and epidemiological information. The expected result is Negative.  Fact Sheet for Patients:  EntrepreneurPulse.com.au  Fact Sheet for Healthcare Providers:  IncredibleEmployment.be  This test is no t yet approved or cleared by the [place] FDA and  has been authorized for detection and/or diagnosis of SARS-CoV-2 by FDA under an Emergency Use Authorization (EUA). This EUA will remain  in effect (meaning this test can be used) for the duration of the COVID-19 declaration under Section 564(b)(1) of the Act, 21 U.S.C.section 360bbb-3(b)(1), unless the authorization is terminated  or revoked sooner.       Influenza A by PCR NEGATIVE NEGATIVE Final   Influenza B by PCR NEGATIVE NEGATIVE Final    Comment: (NOTE) The Xpert Xpress SARS-CoV-2/FLU/RSV plus assay is intended as an aid in the diagnosis of influenza from Nasopharyngeal swab specimens and should not be used as a sole basis for treatment. Nasal washings and aspirates are unacceptable for Xpert Xpress SARS-CoV-2/FLU/RSV testing.  Fact Sheet for  Patients: EntrepreneurPulse.com.au  Fact Sheet for Healthcare Providers: IncredibleEmployment.be  This test is not yet approved or cleared by the [place] FDA and has been authorized for detection and/or diagnosis of SARS-CoV-2 by FDA under an Emergency Use Authorization (EUA). This EUA will remain in effect (meaning this test can be used) for the duration of the COVID-19 declaration under Section 564(b)(1) of the Act, 21 U.S.C. section 360bbb-3(b)(1), unless the authorization is terminated or revoked.  Performed at [facility], [place] [address], [place], [place] [address]   Urine Culture     Status: Abnormal   Collection Time: 03/29/21  7:56 PM   Specimen: Urine, Clean Catch  Result Value Ref Range Status   Specimen Description  Final    URINE, CLEAN CATCH Performed at [facility], [place] [address], [place], [place] [address]    Special Requests   Final    NONE Performed at [facility], [place] [address] [place], [place] [address]    Culture MULTIPLE SPECIES PRESENT, SUGGEST RECOLLECTION (A)  Final   Report Status 03/31/2021 FINAL  Final  Resp Panel by RT-PCR (Flu A&B, Covid) Nasopharyngeal Swab     Status: None   Collection Time: 04/02/21  2:48 PM   Specimen: Nasopharyngeal Swab; Nasopharyngeal(NP) swabs in vial transport medium  Result Value Ref Range Status   SARS Coronavirus 2 by RT PCR NEGATIVE NEGATIVE Final    Comment: (NOTE) SARS-CoV-2 target nucleic acids are NOT DETECTED.  The SARS-CoV-2 RNA is generally detectable in upper respiratory specimens during the acute phase of infection. The lowest concentration of SARS-CoV-2 viral copies this assay can detect is 138 copies/mL. A negative result does not preclude SARS-Cov-2 infection and should not be used as the sole basis for treatment or other patient management decisions. A negative result may occur with  improper  specimen collection/handling, submission of specimen other than nasopharyngeal swab, presence of viral mutation(s) within the areas targeted by this assay, and inadequate number of viral copies(<138 copies/mL). A negative result must be combined with clinical observations, patient history, and epidemiological information. The expected result is Negative.  Fact Sheet for Patients:  EntrepreneurPulse.com.au  Fact Sheet for Healthcare Providers:  IncredibleEmployment.be  This test is no t yet approved or cleared by the [place] FDA and  has been authorized for detection and/or diagnosis of SARS-CoV-2 by FDA under an Emergency Use Authorization (EUA). This EUA will remain  in effect (meaning this test can be used) for the duration of the COVID-19 declaration under Section 564(b)(1) of the Act, 21 U.S.C.section 360bbb-3(b)(1), unless the authorization is terminated  or revoked sooner.       Influenza A by PCR NEGATIVE NEGATIVE Final   Influenza B by PCR NEGATIVE NEGATIVE Final    Comment: (NOTE) The Xpert Xpress SARS-CoV-2/FLU/RSV plus assay is intended as an aid in the diagnosis of influenza from Nasopharyngeal swab specimens and should not be used as a sole basis for treatment. Nasal washings and aspirates are unacceptable for Xpert Xpress SARS-CoV-2/FLU/RSV testing.  Fact Sheet for Patients: EntrepreneurPulse.com.au  Fact Sheet for Healthcare Providers: IncredibleEmployment.be  This test is not yet approved or cleared by the [place] FDA and has been authorized for detection and/or diagnosis of SARS-CoV-2 by FDA under an Emergency Use Authorization (EUA). This EUA will remain in effect (meaning this test can be used) for the duration of the COVID-19 declaration under Section 564(b)(1) of the Act, 21 U.S.C. section 360bbb-3(b)(1), unless the authorization is terminated or revoked.  Performed at  [facility], [place] [address], [place], [place] [address]      Labs: Basic Metabolic Panel: Recent Labs  Lab 03/29/21 1718 03/29/21 1805 03/30/21 0530 03/31/21 0802 04/01/21 1002  NA 139 143 135 137 139  K 3.2* 3.5 3.2* 4.2 4.1  CL 105 103 100 101 101  CO2 27  --  25 29 31   GLUCOSE 97 89 91 106* 144*  BUN 16 13 11 8 9   CREATININE 0.85 0.70 0.69 0.63 0.66  CALCIUM 8.7*  --  8.0* 8.3* 8.5*  MG  --   --  1.3* 2.0  --    Liver Function Tests: Recent Labs  Lab 03/29/21 1718 03/31/21 0802  AST 13* 10*  ALT 6 <5  ALKPHOS 71 69  BILITOT 0.4 0.3  PROT 5.9* 5.4*  ALBUMIN 2.7* 2.3*   No results for input(s): LIPASE, AMYLASE in the last 168 hours. Recent Labs  Lab 03/29/21 1753  AMMONIA <10   CBC: Recent Labs  Lab 03/29/21 1718 03/29/21 1805 03/30/21 0530 03/31/21 0802  WBC 7.5  --  5.7 6.1  NEUTROABS 6.3  --   --   --   HGB 9.4* 9.5* 9.1* 9.5*  HCT 31.2* 28.0* 30.1* 31.8*  MCV 82.8  --  82.9 84.4  PLT 209  --  172 179   Cardiac Enzymes: No results for input(s): CKTOTAL, CKMB, CKMBINDEX, TROPONINI in the last 168 hours. BNP: BNP (last 3 results) Recent Labs    02/11/21 2225 02/18/21 1906  BNP 288.6* 91.6    ProBNP (last 3 results) No results for input(s): PROBNP in the last 8760 hours.  CBG: Recent Labs  Lab 04/02/21 0732 04/02/21 1220 04/02/21 1642 04/02/21 2051 04/03/21 0748  GLUCAP 142* 168* 124* 157* 133*       Signed:  Keaun Schnabel A MD.  [facility] 04/03/2021, 11:12 AM

## 2021-04-03 NOTE — Progress Notes (Signed)
Report called to [facility]. Wife notified via voicemail. IV removed. Pt dressed and awaiting PTAR.

## 2021-04-03 NOTE — Care Management Important Message (Signed)
Important Message  Patient Details IM Letter placed in Patients room Name: James [name] MRN: [id number] Date of Birth: [birth year]   Medicare Important Message Given:  Yes     Kerin Salen 04/03/2021, 11:52 AM

## 2021-04-03 NOTE — Progress Notes (Signed)
Occupational Therapy Treatment Patient Details Name: James [name] MRN: [id number] DOB: 04-May-1945 Today's Date: 04/03/2021   History of present illness James [name] is a 76 y.o. male with medical history significant for stage IIIa non-small cell lung cancer (recently completed radiation, chemotherapy held due to functional decline), COPD, chronic respiratory failure with hypoxia on 4 L supplemental O2 via James [name], paroxysmal atrial flutter on Eliquis, Parkinson's disease, T2DM, HTN, HLD, iron deficiency anemia, depression who presented to the ED for evaluation of generalized weakness.   OT comments  Patient was able to participate in dressing tasks with increased time with less assistance on EOB and with standing on this date. Patient plans to transition to SNF this afternoon. Patient would continue to benefit from skilled OT services at this time while admitted and after d/c to address noted deficits in order to improve overall safety and independence in ADLs.     Recommendations for follow up therapy are one component of a multi-disciplinary discharge planning process, led by the attending physician.  Recommendations may be updated based on patient status, additional functional criteria and insurance authorization.    Follow Up Recommendations  Skilled nursing-short term rehab (<3 hours/day)    Assistance Recommended at Discharge Frequent or constant Supervision/Assistance  Patient can return home with the following  Two people to help with walking and/or transfers;Two people to help with bathing/dressing/bathroom;Direct supervision/assist for medications management;Help with stairs or ramp for entrance;Assist for transportation;Direct supervision/assist for financial management;Assistance with cooking/housework   Equipment Recommendations  None recommended by OT    Recommendations for Other Services      Precautions / Restrictions Precautions Precautions: Fall Precaution Comments:  on 4 L at home, L eye blind Restrictions Weight Bearing Restrictions: No       Mobility Bed Mobility Overal bed mobility: Needs Assistance   Rolling: Min assist   Supine to sit: Mod assist, HOB elevated Sit to supine: Mod assist        Transfers                         Balance                                           ADL either performed or assessed with clinical judgement   ADL Overall ADL's : Needs assistance/impaired         Upper Body Bathing: Minimal assistance;Bed level   Lower Body Bathing: Bed level;Maximal assistance   Upper Body Dressing : Minimal assistance;Sitting Upper Body Dressing Details (indicate cue type and reason): EOB Lower Body Dressing: Sit to/from stand;Sitting/lateral leans;Maximal assistance Lower Body Dressing Details (indicate cue type and reason): with RW with poor safety awareness with posterior leaning   Toilet Transfer Details (indicate cue type and reason): paitent was able to take small steps to [facility]. not able to sit up in chair with PTAR on the way at this time.                Extremity/Trunk Assessment              Vision       Perception     Praxis      Cognition Arousal/Alertness: Awake/alert Behavior During Therapy: Flat affect Overall Cognitive Status: Within Functional Limits for tasks assessed  Exercises      Shoulder Instructions       General Comments      Pertinent Vitals/ Pain       Pain Assessment Pain Assessment: Faces Faces Pain Scale: Hurts a little bit Pain Location: right  and middle of chest Pain Descriptors / Indicators: Discomfort, Grimacing, Guarding Pain Intervention(s): Monitored during session, Repositioned  Home Living                                          Prior Functioning/Environment              Frequency  Min 2X/week        Progress Toward  Goals  OT Goals(current goals can now be found in the care plan section)  Progress towards OT goals: Progressing toward goals     Plan Discharge plan remains appropriate    Co-evaluation                 AM-PAC OT "6 Clicks" Daily Activity     Outcome Measure   Help from another person eating meals?: A Little Help from another person taking care of personal grooming?: A Little Help from another person toileting, which includes using toliet, bedpan, or urinal?: A Lot Help from another person bathing (including washing, rinsing, drying)?: A Lot Help from another person to put on and taking off regular upper body clothing?: A Lot Help from another person to put on and taking off regular lower body clothing?: A Lot 6 Click Score: 14    End of Session Equipment Utilized During Treatment: Gait belt;Rolling walker (2 wheels)  OT Visit Diagnosis: Unsteadiness on feet (R26.81);Other abnormalities of gait and mobility (R26.89);History of falling (Z91.81);Repeated falls (R29.6)   Activity Tolerance Patient tolerated treatment well   Patient Left in bed;with call bell/phone within reach;with family/visitor present   Nurse Communication Mobility status        Time: 1311-1330 OT Time Calculation (min): 19 min  Charges: OT General Charges $OT Visit: 1 Visit OT Treatments $Self Care/Home Management : 8-22 mins  Jackelyn Poling OTR/L, MS Acute Rehabilitation Department Office# [phone] Pager# [phone]   [name] 04/03/2021, 2:47 PM

## 2021-04-04 DIAGNOSIS — I4892 Unspecified atrial flutter: Secondary | ICD-10-CM | POA: Diagnosis not present

## 2021-04-04 DIAGNOSIS — R262 Difficulty in walking, not elsewhere classified: Secondary | ICD-10-CM | POA: Diagnosis not present

## 2021-04-04 DIAGNOSIS — E118 Type 2 diabetes mellitus with unspecified complications: Secondary | ICD-10-CM | POA: Diagnosis not present

## 2021-04-04 DIAGNOSIS — R197 Diarrhea, unspecified: Secondary | ICD-10-CM | POA: Diagnosis not present

## 2021-04-04 DIAGNOSIS — R8271 Bacteriuria: Secondary | ICD-10-CM | POA: Diagnosis not present

## 2021-04-04 DIAGNOSIS — Z6824 Body mass index (BMI) 24.0-24.9, adult: Secondary | ICD-10-CM | POA: Diagnosis not present

## 2021-04-04 DIAGNOSIS — E440 Moderate protein-calorie malnutrition: Secondary | ICD-10-CM | POA: Diagnosis not present

## 2021-04-04 DIAGNOSIS — J9610 Chronic respiratory failure, unspecified whether with hypoxia or hypercapnia: Secondary | ICD-10-CM | POA: Diagnosis not present

## 2021-04-04 DIAGNOSIS — J449 Chronic obstructive pulmonary disease, unspecified: Secondary | ICD-10-CM | POA: Diagnosis not present

## 2021-04-04 DIAGNOSIS — E611 Iron deficiency: Secondary | ICD-10-CM | POA: Diagnosis not present

## 2021-04-04 DIAGNOSIS — C3492 Malignant neoplasm of unspecified part of left bronchus or lung: Secondary | ICD-10-CM | POA: Diagnosis not present

## 2021-04-04 DIAGNOSIS — G20 Parkinson's disease: Secondary | ICD-10-CM | POA: Diagnosis not present

## 2021-04-04 DIAGNOSIS — R5381 Other malaise: Secondary | ICD-10-CM | POA: Diagnosis not present

## 2021-04-04 DIAGNOSIS — E041 Nontoxic single thyroid nodule: Secondary | ICD-10-CM | POA: Diagnosis not present

## 2021-04-08 DIAGNOSIS — M6281 Muscle weakness (generalized): Secondary | ICD-10-CM | POA: Diagnosis not present

## 2021-04-08 DIAGNOSIS — E119 Type 2 diabetes mellitus without complications: Secondary | ICD-10-CM | POA: Diagnosis not present

## 2021-04-08 DIAGNOSIS — R627 Adult failure to thrive: Secondary | ICD-10-CM | POA: Diagnosis not present

## 2021-04-08 DIAGNOSIS — R4189 Other symptoms and signs involving cognitive functions and awareness: Secondary | ICD-10-CM | POA: Diagnosis not present

## 2021-04-08 DIAGNOSIS — E041 Nontoxic single thyroid nodule: Secondary | ICD-10-CM | POA: Diagnosis not present

## 2021-04-08 DIAGNOSIS — F418 Other specified anxiety disorders: Secondary | ICD-10-CM | POA: Diagnosis not present

## 2021-04-08 DIAGNOSIS — F4320 Adjustment disorder, unspecified: Secondary | ICD-10-CM | POA: Diagnosis not present

## 2021-04-08 DIAGNOSIS — G20 Parkinson's disease: Secondary | ICD-10-CM | POA: Diagnosis not present

## 2021-04-08 DIAGNOSIS — C3492 Malignant neoplasm of unspecified part of left bronchus or lung: Secondary | ICD-10-CM | POA: Diagnosis not present

## 2021-04-08 DIAGNOSIS — J9610 Chronic respiratory failure, unspecified whether with hypoxia or hypercapnia: Secondary | ICD-10-CM | POA: Diagnosis not present

## 2021-04-08 DIAGNOSIS — F331 Major depressive disorder, recurrent, moderate: Secondary | ICD-10-CM | POA: Diagnosis not present

## 2021-04-08 DIAGNOSIS — J449 Chronic obstructive pulmonary disease, unspecified: Secondary | ICD-10-CM | POA: Diagnosis not present

## 2021-04-08 DIAGNOSIS — I4892 Unspecified atrial flutter: Secondary | ICD-10-CM | POA: Diagnosis not present

## 2021-04-09 ENCOUNTER — Other Ambulatory Visit: Payer: Self-pay

## 2021-04-09 ENCOUNTER — Emergency Department (HOSPITAL_COMMUNITY): Payer: Medicare Other

## 2021-04-09 ENCOUNTER — Inpatient Hospital Stay (HOSPITAL_COMMUNITY)
Admission: EM | Admit: 2021-04-09 | Discharge: 2021-04-15 | DRG: 308 | Disposition: A | Discharge status: Skilled Nursing Facility | Payer: Medicare Other | Source: Skilled Nursing Facility | Attending: Internal Medicine | Admitting: Internal Medicine

## 2021-04-09 DIAGNOSIS — Z7901 Long term (current) use of anticoagulants: Secondary | ICD-10-CM

## 2021-04-09 DIAGNOSIS — Z825 Family history of asthma and other chronic lower respiratory diseases: Secondary | ICD-10-CM | POA: Diagnosis not present

## 2021-04-09 DIAGNOSIS — G20A1 Parkinson's disease without dyskinesia, without mention of fluctuations: Secondary | ICD-10-CM | POA: Diagnosis present

## 2021-04-09 DIAGNOSIS — R939 Diagnostic imaging inconclusive due to excess body fat of patient: Secondary | ICD-10-CM | POA: Diagnosis not present

## 2021-04-09 DIAGNOSIS — Z87891 Personal history of nicotine dependence: Secondary | ICD-10-CM | POA: Diagnosis not present

## 2021-04-09 DIAGNOSIS — E785 Hyperlipidemia, unspecified: Secondary | ICD-10-CM | POA: Diagnosis not present

## 2021-04-09 DIAGNOSIS — R531 Weakness: Secondary | ICD-10-CM | POA: Diagnosis not present

## 2021-04-09 DIAGNOSIS — Z7401 Bed confinement status: Secondary | ICD-10-CM | POA: Diagnosis not present

## 2021-04-09 DIAGNOSIS — Z7189 Other specified counseling: Secondary | ICD-10-CM

## 2021-04-09 DIAGNOSIS — R41841 Cognitive communication deficit: Secondary | ICD-10-CM | POA: Diagnosis not present

## 2021-04-09 DIAGNOSIS — R54 Age-related physical debility: Secondary | ICD-10-CM | POA: Diagnosis present

## 2021-04-09 DIAGNOSIS — R278 Other lack of coordination: Secondary | ICD-10-CM | POA: Diagnosis not present

## 2021-04-09 DIAGNOSIS — Z789 Other specified health status: Secondary | ICD-10-CM

## 2021-04-09 DIAGNOSIS — Z993 Dependence on wheelchair: Secondary | ICD-10-CM

## 2021-04-09 DIAGNOSIS — Z7951 Long term (current) use of inhaled steroids: Secondary | ICD-10-CM

## 2021-04-09 DIAGNOSIS — D500 Iron deficiency anemia secondary to blood loss (chronic): Secondary | ICD-10-CM

## 2021-04-09 DIAGNOSIS — Z923 Personal history of irradiation: Secondary | ICD-10-CM | POA: Diagnosis not present

## 2021-04-09 DIAGNOSIS — D509 Iron deficiency anemia, unspecified: Secondary | ICD-10-CM | POA: Diagnosis present

## 2021-04-09 DIAGNOSIS — Z7984 Long term (current) use of oral hypoglycemic drugs: Secondary | ICD-10-CM | POA: Diagnosis not present

## 2021-04-09 DIAGNOSIS — J439 Emphysema, unspecified: Secondary | ICD-10-CM | POA: Diagnosis present

## 2021-04-09 DIAGNOSIS — J9611 Chronic respiratory failure with hypoxia: Secondary | ICD-10-CM | POA: Diagnosis not present

## 2021-04-09 DIAGNOSIS — E1169 Type 2 diabetes mellitus with other specified complication: Secondary | ICD-10-CM | POA: Diagnosis not present

## 2021-04-09 DIAGNOSIS — Z8616 Personal history of COVID-19: Secondary | ICD-10-CM | POA: Diagnosis not present

## 2021-04-09 DIAGNOSIS — Z79899 Other long term (current) drug therapy: Secondary | ICD-10-CM

## 2021-04-09 DIAGNOSIS — J90 Pleural effusion, not elsewhere classified: Secondary | ICD-10-CM

## 2021-04-09 DIAGNOSIS — E46 Unspecified protein-calorie malnutrition: Secondary | ICD-10-CM | POA: Diagnosis not present

## 2021-04-09 DIAGNOSIS — G20 Parkinson's disease: Secondary | ICD-10-CM

## 2021-04-09 DIAGNOSIS — Z9981 Dependence on supplemental oxygen: Secondary | ICD-10-CM

## 2021-04-09 DIAGNOSIS — E43 Unspecified severe protein-calorie malnutrition: Secondary | ICD-10-CM | POA: Diagnosis present

## 2021-04-09 DIAGNOSIS — Z515 Encounter for palliative care: Secondary | ICD-10-CM

## 2021-04-09 DIAGNOSIS — Z711 Person with feared health complaint in whom no diagnosis is made: Secondary | ICD-10-CM | POA: Diagnosis not present

## 2021-04-09 DIAGNOSIS — M6259 Muscle wasting and atrophy, not elsewhere classified, multiple sites: Secondary | ICD-10-CM | POA: Diagnosis not present

## 2021-04-09 DIAGNOSIS — Z8249 Family history of ischemic heart disease and other diseases of the circulatory system: Secondary | ICD-10-CM | POA: Diagnosis not present

## 2021-04-09 DIAGNOSIS — I4892 Unspecified atrial flutter: Secondary | ICD-10-CM | POA: Diagnosis not present

## 2021-04-09 DIAGNOSIS — Z9221 Personal history of antineoplastic chemotherapy: Secondary | ICD-10-CM

## 2021-04-09 DIAGNOSIS — R1312 Dysphagia, oropharyngeal phase: Secondary | ICD-10-CM | POA: Diagnosis not present

## 2021-04-09 DIAGNOSIS — E876 Hypokalemia: Secondary | ICD-10-CM | POA: Diagnosis not present

## 2021-04-09 DIAGNOSIS — I2510 Atherosclerotic heart disease of native coronary artery without angina pectoris: Secondary | ICD-10-CM | POA: Diagnosis present

## 2021-04-09 DIAGNOSIS — C3432 Malignant neoplasm of lower lobe, left bronchus or lung: Secondary | ICD-10-CM | POA: Diagnosis not present

## 2021-04-09 DIAGNOSIS — R2689 Other abnormalities of gait and mobility: Secondary | ICD-10-CM | POA: Diagnosis not present

## 2021-04-09 DIAGNOSIS — I484 Atypical atrial flutter: Secondary | ICD-10-CM | POA: Diagnosis present

## 2021-04-09 DIAGNOSIS — R627 Adult failure to thrive: Secondary | ICD-10-CM | POA: Diagnosis present

## 2021-04-09 DIAGNOSIS — I4891 Unspecified atrial fibrillation: Secondary | ICD-10-CM | POA: Diagnosis present

## 2021-04-09 DIAGNOSIS — R5381 Other malaise: Secondary | ICD-10-CM | POA: Diagnosis not present

## 2021-04-09 DIAGNOSIS — R638 Other symptoms and signs concerning food and fluid intake: Secondary | ICD-10-CM

## 2021-04-09 DIAGNOSIS — Z809 Family history of malignant neoplasm, unspecified: Secondary | ICD-10-CM

## 2021-04-09 DIAGNOSIS — J948 Other specified pleural conditions: Secondary | ICD-10-CM | POA: Diagnosis not present

## 2021-04-09 DIAGNOSIS — M6281 Muscle weakness (generalized): Secondary | ICD-10-CM | POA: Diagnosis not present

## 2021-04-09 DIAGNOSIS — Z6824 Body mass index (BMI) 24.0-24.9, adult: Secondary | ICD-10-CM

## 2021-04-09 DIAGNOSIS — F32A Depression, unspecified: Secondary | ICD-10-CM | POA: Diagnosis present

## 2021-04-09 DIAGNOSIS — I10 Essential (primary) hypertension: Secondary | ICD-10-CM | POA: Diagnosis present

## 2021-04-09 DIAGNOSIS — I959 Hypotension, unspecified: Secondary | ICD-10-CM | POA: Diagnosis not present

## 2021-04-09 DIAGNOSIS — I700 Atherosclerosis of aorta: Secondary | ICD-10-CM | POA: Diagnosis not present

## 2021-04-09 DIAGNOSIS — Z9889 Other specified postprocedural states: Secondary | ICD-10-CM

## 2021-04-09 DIAGNOSIS — J918 Pleural effusion in other conditions classified elsewhere: Secondary | ICD-10-CM | POA: Diagnosis present

## 2021-04-09 DIAGNOSIS — R000 Tachycardia, unspecified: Secondary | ICD-10-CM | POA: Diagnosis not present

## 2021-04-09 DIAGNOSIS — Z9049 Acquired absence of other specified parts of digestive tract: Secondary | ICD-10-CM

## 2021-04-09 DIAGNOSIS — R262 Difficulty in walking, not elsewhere classified: Secondary | ICD-10-CM | POA: Diagnosis not present

## 2021-04-09 DIAGNOSIS — R911 Solitary pulmonary nodule: Secondary | ICD-10-CM | POA: Diagnosis not present

## 2021-04-09 DIAGNOSIS — J9811 Atelectasis: Secondary | ICD-10-CM | POA: Diagnosis not present

## 2021-04-09 DIAGNOSIS — K219 Gastro-esophageal reflux disease without esophagitis: Secondary | ICD-10-CM | POA: Diagnosis present

## 2021-04-09 LAB — URINALYSIS, ROUTINE W REFLEX MICROSCOPIC
Bilirubin Urine: NEGATIVE
Glucose, UA: NEGATIVE mg/dL
Hgb urine dipstick: NEGATIVE
Ketones, ur: 5 mg/dL — AB
Nitrite: NEGATIVE
Protein, ur: 30 mg/dL — AB
Specific Gravity, Urine: 1.024 (ref 1.005–1.030)
pH: 5 (ref 5.0–8.0)

## 2021-04-09 LAB — COMPREHENSIVE METABOLIC PANEL
ALT: 9 U/L (ref 0–44)
AST: 14 U/L — ABNORMAL LOW (ref 15–41)
Albumin: 1.8 g/dL — ABNORMAL LOW (ref 3.5–5.0)
Alkaline Phosphatase: 70 U/L (ref 38–126)
Anion gap: 12 (ref 5–15)
BUN: 14 mg/dL (ref 8–23)
CO2: 29 mmol/L (ref 22–32)
Calcium: 8.8 mg/dL — ABNORMAL LOW (ref 8.9–10.3)
Chloride: 96 mmol/L — ABNORMAL LOW (ref 98–111)
Creatinine, Ser: 0.87 mg/dL (ref 0.61–1.24)
GFR, Estimated: >60 mL/min (ref >60)
Glucose, Bld: 94 mg/dL (ref 70–99)
Potassium: 3 mmol/L — ABNORMAL LOW (ref 3.5–5.1)
Sodium: 137 mmol/L (ref 135–145)
Total Bilirubin: 0.2 mg/dL — ABNORMAL LOW (ref 0.3–1.2)
Total Protein: 5.1 g/dL — ABNORMAL LOW (ref 6.5–8.1)

## 2021-04-09 LAB — PREALBUMIN: Prealbumin: 7 mg/dL — ABNORMAL LOW (ref 18–38)

## 2021-04-09 LAB — CBC
HCT: 32.6 % — ABNORMAL LOW (ref 39.0–52.0)
Hemoglobin: 9.7 g/dL — ABNORMAL LOW (ref 13.0–17.0)
MCH: 24.6 pg — ABNORMAL LOW (ref 26.0–34.0)
MCHC: 29.8 g/dL — ABNORMAL LOW (ref 30.0–36.0)
MCV: 82.7 fL (ref 80.0–100.0)
Platelets: 216 10*3/uL (ref 150–400)
RBC: 3.94 MIL/uL — ABNORMAL LOW (ref 4.22–5.81)
RDW: 28.8 % — ABNORMAL HIGH (ref 11.5–15.5)
WBC: 10.3 10*3/uL (ref 4.0–10.5)
nRBC: 0 % (ref 0.0–0.2)

## 2021-04-09 LAB — MAGNESIUM: Magnesium: 1.5 mg/dL — ABNORMAL LOW (ref 1.7–2.4)

## 2021-04-09 LAB — PROCALCITONIN: Procalcitonin: 0.1 ng/mL

## 2021-04-09 LAB — HEPARIN LEVEL (UNFRACTIONATED): Heparin Unfractionated: 0.46 IU/mL (ref 0.30–0.70)

## 2021-04-09 LAB — PROTIME-INR
INR: 2.6 — ABNORMAL HIGH (ref 0.8–1.2)
Prothrombin Time: 27.4 seconds — ABNORMAL HIGH (ref 11.4–15.2)

## 2021-04-09 LAB — BRAIN NATRIURETIC PEPTIDE: B Natriuretic Peptide: 177.3 pg/mL — ABNORMAL HIGH (ref 0.0–100.0)

## 2021-04-09 LAB — APTT: aPTT: 54 seconds — ABNORMAL HIGH (ref 24–36)

## 2021-04-09 IMAGING — DX DG CHEST 1V PORT
1 series · 1 of 1 positions shown · non-contrast
Comparison: [DATE].

CLINICAL DATA: Flutter, weakness, tachycardia.

EXAM:
PORTABLE CHEST 1 VIEW

[chest ap]
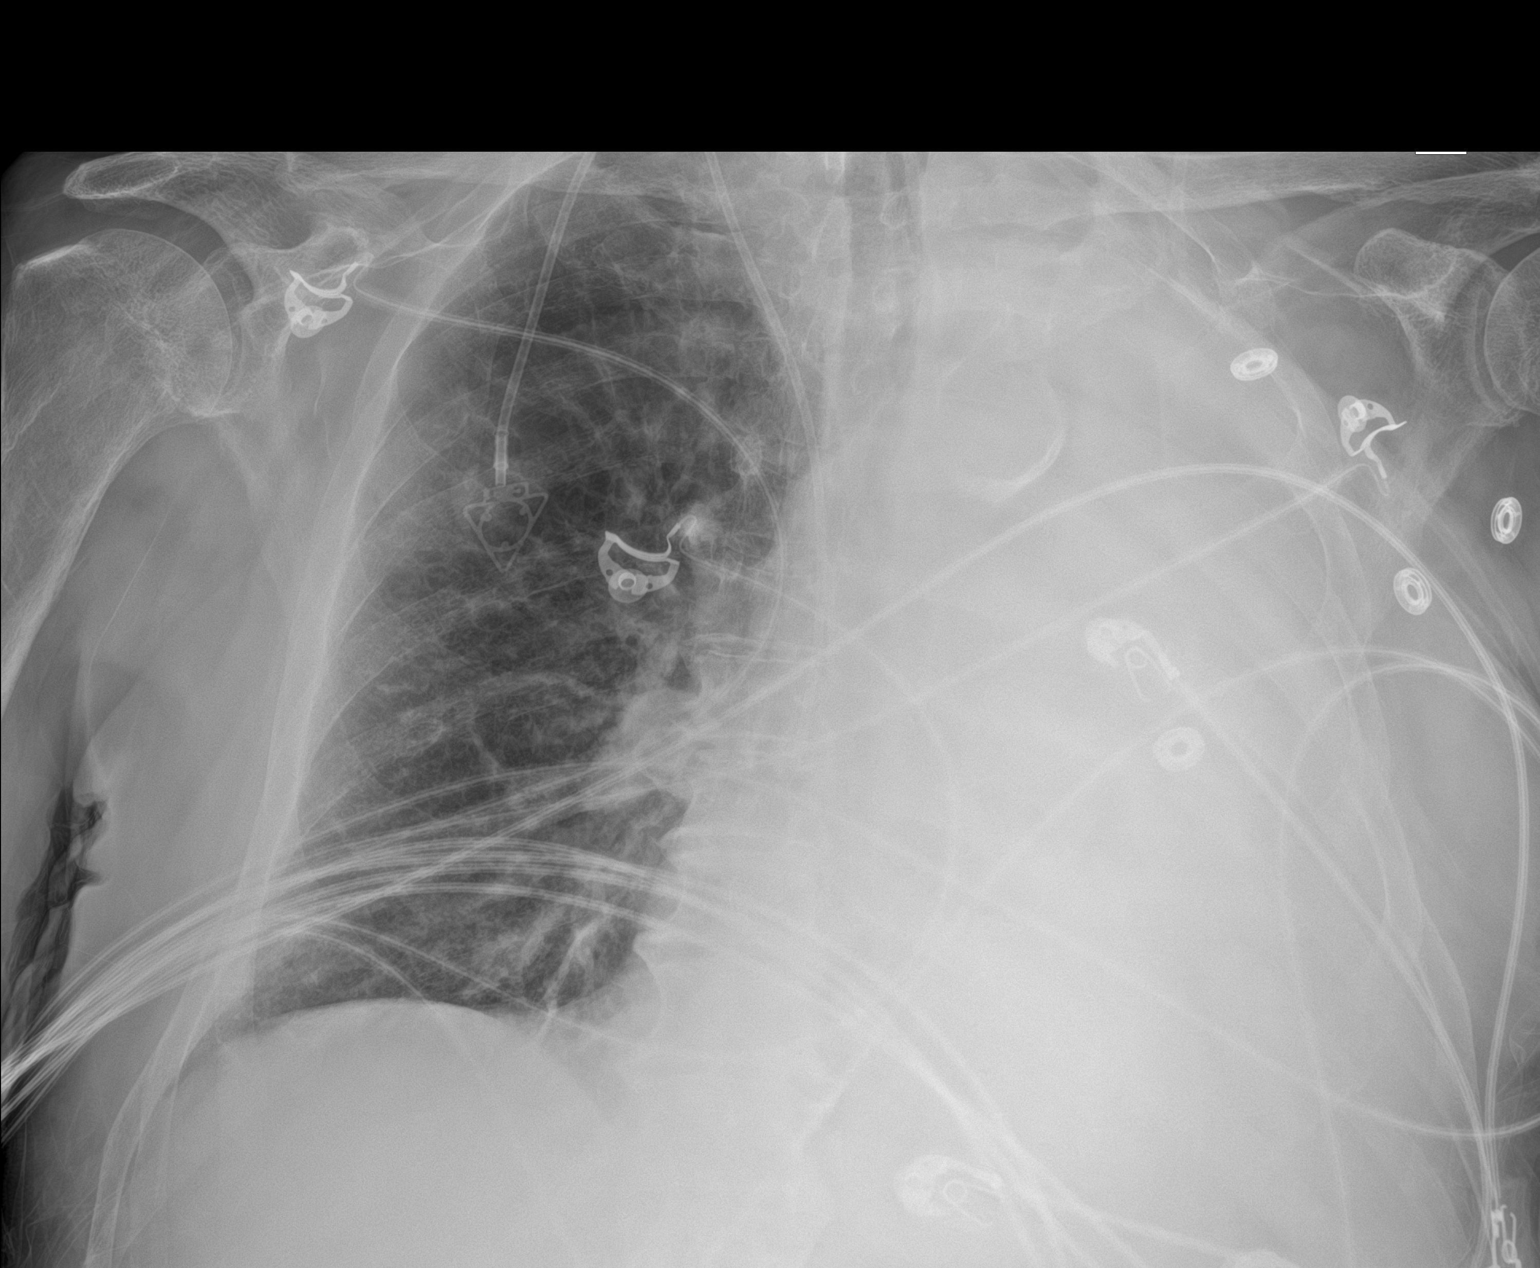

[1 of 1 positions shown; findings below may reference images not displayed]

FINDINGS: The heart size and mediastinal contours are obscured. There is
atherosclerotic calcification of the aorta. There is complete
opacification of the left lung. Mild atelectasis is noted at the
right lung base. No pneumothorax. A stable right chest port is
noted. No acute osseous abnormality.
IMPRESSION: Complete opacification of the left lung, possibly representing a
moderate to large pleural effusion with atelectasis or infiltrate.
Findings may be associated with postobstructive pneumonitis related
to known left perihilar mass.

## 2021-04-09 MED ORDER — SODIUM CHLORIDE 0.9% FLUSH
10.0000 mL | INTRAVENOUS | Status: DC | PRN
  Administered 2021-04-15: 10 mL

## 2021-04-09 MED ORDER — MAGNESIUM SULFATE 2 GM/50ML IV SOLN
2.0000 g | Freq: Once | INTRAVENOUS | Status: AC
  Administered 2021-04-09: 2 g via INTRAVENOUS
  Filled 2021-04-09: qty 50

## 2021-04-09 MED ORDER — ATORVASTATIN CALCIUM 10 MG PO TABS
20.0000 mg | ORAL_TABLET | Freq: Every day | ORAL | Status: DC
  Administered 2021-04-09 – 2021-04-14 (×6): 20 mg via ORAL
  Filled 2021-04-09 (×6): qty 2

## 2021-04-09 MED ORDER — AMIODARONE HCL IN DEXTROSE 360-4.14 MG/200ML-% IV SOLN
30.0000 mg/h | INTRAVENOUS | Status: DC
  Administered 2021-04-10 – 2021-04-13 (×8): 30 mg/h via INTRAVENOUS
  Filled 2021-04-09 (×11): qty 200

## 2021-04-09 MED ORDER — LIDOCAINE 5 % EX PTCH
2.0000 | MEDICATED_PATCH | Freq: Every day | CUTANEOUS | Status: DC
  Administered 2021-04-14 – 2021-04-15 (×2): 2 via TRANSDERMAL
  Filled 2021-04-09 (×4): qty 2

## 2021-04-09 MED ORDER — ACETAMINOPHEN 650 MG RE SUPP: 650.0000 mg | Freq: Four times a day (QID) | RECTAL | Status: DC | PRN

## 2021-04-09 MED ORDER — OMEPRAZOLE MAGNESIUM 20 MG PO TBEC: 20.0000 mg | DELAYED_RELEASE_TABLET | Freq: Every evening | ORAL | Status: DC

## 2021-04-09 MED ORDER — SODIUM CHLORIDE 0.9 % IV SOLN: INTRAVENOUS | Status: DC

## 2021-04-09 MED ORDER — LIDOCAINE-PRILOCAINE 2.5-2.5 % EX CREA: 1.0000 "application " | TOPICAL_CREAM | CUTANEOUS | Status: DC | PRN

## 2021-04-09 MED ORDER — BUPROPION HCL ER (XL) 150 MG PO TB24
150.0000 mg | ORAL_TABLET | Freq: Every day | ORAL | Status: DC
  Administered 2021-04-10 – 2021-04-15 (×6): 150 mg via ORAL
  Filled 2021-04-09 (×7): qty 1

## 2021-04-09 MED ORDER — CHLORHEXIDINE GLUCONATE CLOTH 2 % EX PADS
6.0000 | MEDICATED_PAD | Freq: Every day | CUTANEOUS | Status: DC
  Administered 2021-04-11 – 2021-04-14 (×4): 6 via TOPICAL

## 2021-04-09 MED ORDER — ACETAMINOPHEN 325 MG PO TABS
650.0000 mg | ORAL_TABLET | Freq: Four times a day (QID) | ORAL | Status: DC | PRN
  Administered 2021-04-11: 650 mg via ORAL
  Filled 2021-04-09: qty 2

## 2021-04-09 MED ORDER — DILTIAZEM HCL-DEXTROSE 125-5 MG/125ML-% IV SOLN (PREMIX)
5.0000 mg/h | INTRAVENOUS | Status: DC
  Administered 2021-04-09: 5 mg/h via INTRAVENOUS
  Filled 2021-04-09 (×2): qty 125

## 2021-04-09 MED ORDER — SODIUM CHLORIDE 0.9% FLUSH
3.0000 mL | Freq: Two times a day (BID) | INTRAVENOUS | Status: DC
  Administered 2021-04-09 – 2021-04-14 (×6): 3 mL via INTRAVENOUS

## 2021-04-09 MED ORDER — AMIODARONE HCL IN DEXTROSE 360-4.14 MG/200ML-% IV SOLN
60.0000 mg/h | INTRAVENOUS | Status: AC
  Administered 2021-04-09: 60 mg/h via INTRAVENOUS
  Filled 2021-04-09: qty 200

## 2021-04-09 MED ORDER — SERTRALINE HCL 50 MG PO TABS
50.0000 mg | ORAL_TABLET | Freq: Every day | ORAL | Status: DC
Start: 2021-04-09 — End: 2021-04-09

## 2021-04-09 MED ORDER — AMIODARONE HCL 150 MG/3ML IV SOLN
150.0000 mg | Freq: Once | INTRAVENOUS | Status: DC
Start: 2021-04-09 — End: 2021-04-09

## 2021-04-09 MED ORDER — POTASSIUM CHLORIDE CRYS ER 20 MEQ PO TBCR
60.0000 meq | EXTENDED_RELEASE_TABLET | ORAL | Status: AC
Start: 2021-04-09 — End: 2021-04-09
  Administered 2021-04-09: 60 meq via ORAL
  Filled 2021-04-09: qty 3

## 2021-04-09 MED ORDER — SERTRALINE HCL 50 MG PO TABS
150.0000 mg | ORAL_TABLET | Freq: Every day | ORAL | Status: DC
  Administered 2021-04-10 – 2021-04-15 (×6): 150 mg via ORAL
  Filled 2021-04-09 (×6): qty 1

## 2021-04-09 MED ORDER — HEPARIN (PORCINE) 25000 UT/250ML-% IV SOLN
2200.0000 [IU]/h | INTRAVENOUS | Status: DC
  Administered 2021-04-09: 1200 [IU]/h via INTRAVENOUS
  Administered 2021-04-10 – 2021-04-11 (×2): 1500 [IU]/h via INTRAVENOUS
  Administered 2021-04-11: 2000 [IU]/h via INTRAVENOUS
  Administered 2021-04-12: 2200 [IU]/h via INTRAVENOUS
  Filled 2021-04-09 (×5): qty 250

## 2021-04-09 MED ORDER — ARFORMOTEROL TARTRATE 15 MCG/2ML IN NEBU
15.0000 ug | INHALATION_SOLUTION | Freq: Two times a day (BID) | RESPIRATORY_TRACT | Status: DC
  Administered 2021-04-09 – 2021-04-15 (×11): 15 ug via RESPIRATORY_TRACT
  Filled 2021-04-09 (×12): qty 2

## 2021-04-09 MED ORDER — SODIUM CHLORIDE 0.9% FLUSH
10.0000 mL | Freq: Two times a day (BID) | INTRAVENOUS | Status: DC
  Administered 2021-04-09 – 2021-04-14 (×8): 10 mL

## 2021-04-09 MED ORDER — LEVALBUTEROL HCL 0.63 MG/3ML IN NEBU
0.6300 mg | INHALATION_SOLUTION | Freq: Three times a day (TID) | RESPIRATORY_TRACT | Status: DC
  Administered 2021-04-09 – 2021-04-11 (×6): 0.63 mg via RESPIRATORY_TRACT
  Filled 2021-04-09 (×6): qty 3

## 2021-04-09 MED ORDER — DONEPEZIL HCL 10 MG PO TABS
10.0000 mg | ORAL_TABLET | Freq: Every day | ORAL | Status: DC
  Administered 2021-04-09 – 2021-04-14 (×6): 10 mg via ORAL
  Filled 2021-04-09 (×6): qty 1

## 2021-04-09 MED ORDER — RISAQUAD PO CAPS
1.0000 | ORAL_CAPSULE | ORAL | Status: DC
  Administered 2021-04-11 – 2021-04-15 (×3): 1 via ORAL
  Filled 2021-04-09 (×5): qty 1

## 2021-04-09 MED ORDER — CARBIDOPA-LEVODOPA ER 25-100 MG PO TBCR
1.5000 | EXTENDED_RELEASE_TABLET | Freq: Three times a day (TID) | ORAL | Status: DC
  Administered 2021-04-09 – 2021-04-14 (×17): 1.5 via ORAL
  Filled 2021-04-09 (×22): qty 1.5

## 2021-04-09 MED ORDER — PANTOPRAZOLE SODIUM 40 MG PO TBEC
40.0000 mg | DELAYED_RELEASE_TABLET | Freq: Every evening | ORAL | Status: DC
  Administered 2021-04-09 – 2021-04-14 (×6): 40 mg via ORAL
  Filled 2021-04-09 (×6): qty 1

## 2021-04-09 MED ORDER — SODIUM CHLORIDE 0.9 % IV BOLUS
500.0000 mL | Freq: Once | INTRAVENOUS | Status: AC
  Administered 2021-04-09: 500 mL via INTRAVENOUS

## 2021-04-09 MED ORDER — UMECLIDINIUM BROMIDE 62.5 MCG/ACT IN AEPB
1.0000 | INHALATION_SPRAY | Freq: Every day | RESPIRATORY_TRACT | Status: DC
  Administered 2021-04-11 – 2021-04-15 (×3): 1 via RESPIRATORY_TRACT
  Filled 2021-04-09 (×2): qty 7

## 2021-04-09 MED ORDER — FAMOTIDINE 20 MG PO TABS
20.0000 mg | ORAL_TABLET | Freq: Every day | ORAL | Status: DC
  Administered 2021-04-10 – 2021-04-15 (×6): 20 mg via ORAL
  Filled 2021-04-09 (×6): qty 1

## 2021-04-09 NOTE — Assessment & Plan Note (Addendum)
Repleted during hospitalization.

## 2021-04-09 NOTE — Progress Notes (Signed)
ANTICOAGULATION CONSULT NOTE - Initial Consult  Pharmacy Consult for heparin Indication: atrial fibrillation  Allergies  Allergen Reactions   Aspirin Other (See Comments)    GI bleed   Codeine Itching   Morphine Itching and Nausea Only    Patient Measurements: Height: 6\' 1"  (185.4 cm) Weight: 85 kg (187 lb 6.3 oz) IBW/kg (Calculated) : 79.9 Heparin Dosing Weight: TBW  Vital Signs: Temp: 98.2 F (36.8 C) (02/14 0236) Temp Source: Oral (02/14 0236) BP: 118/62 (02/14 0800) Pulse Rate: 77 (02/14 0800)  Labs: Recent Labs    04/09/21 0245  HGB 9.7*  HCT 32.6*  PLT 216  LABPROT 27.4*  INR 2.6*  CREATININE 0.87    Estimated Creatinine Clearance: 82.9 mL/min (by C-G formula based on SCr of 0.87 mg/dL).   Medical History: Past Medical History:  Diagnosis Date   Allergy    seasonal   Blind left eye    legally   Blood transfusion    2002   COPD (chronic obstructive pulmonary disease) (HCC)    Depression    Diabetes mellitus, type 2 (HCC)    Diverticulosis    Dyspnea    ED (erectile dysfunction)    Glaucoma    Hyperlipidemia    Hypertension    Insomnia    Internal and external bleeding hemorrhoids    Iron deficiency anemia    Lung cancer ([place])    Osteoarthritis    Parkinson's disease ([place]) 2014   Dr. [name] PCP  and Dr. [name] at [facility]   Paroxysmal atrial flutter [facility])    Rosacea    Tremor of both hands    Tubular adenoma of colon 03/28/2011    Assessment: 48 YOM presenting with weakness and tachycardia, hx of afib on Eliquis PTA with last dose 2/13 @2330 , chronic anemia stable, plts 216  Goal of Therapy:  Heparin level 0.3-0.7 units/ml aPTT 66-102 seconds Monitor platelets by anticoagulation protocol: Yes   Plan:  Heparin gtt at 1200 units/hr at 1100, no bolus F/u 8 hour aPTT/HL  Bertis Ruddy, PharmD Clinical Pharmacist ED Pharmacist Phone # [phone] 04/09/2021 8:58 AM

## 2021-04-09 NOTE — Assessment & Plan Note (Addendum)
Patient presented after being found to be in atrial flutter with heart rates into the 150s.  Once in the ED patient was started on a Cardizem drip with some improvement in heart rates.CHA2DS2-VASc score = 5.  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  2022 with LVEF 55-60%, mild concentric LVH, LA/RA moderately dilated, small pericardial effusion without tamponade, mild dilation ascending aorta measuring 40 mm, IVC dilated.  TSH 1.111 on 02/18/2021.  Cardiology was consulted and followed during hospital course.  Patient was initially started on amiodarone drip and now transition to 400mg  PO BID today x 7 days, followed by 200mg  BID x 2 weeks and 200mg  daily.  Also started on metoprolol tartrate 50 mg p.o. twice daily, and digoxin.  Continue anticoagulation with Eliquis 5 mg p.o. twice daily.  Discharging with outpatient palliative care to follow, with anticipation of transitioning to hospice care in the near future.

## 2021-04-09 NOTE — ED Notes (Signed)
Pt is much more alert and oriented at this time than what was reported to me or what was observed over the last couple of hours

## 2021-04-09 NOTE — ED Provider Notes (Signed)
[facility] Emergency Department Provider Note MRN:  [id number]  Arrival date & time: 04/09/21     Chief Complaint   Weakness and Tachycardia   History of Present Illness   James [name] is a 76 y.o. year-old male with a history of COPD, lung cancer presenting to the ED with chief complaint of weakness and tachycardia.  Patient feels generally weak, has felt this way for a long time.  No significant symptom changes this evening.  Sent here by facility for elevated heart rate.  Review of Systems  A thorough review of systems was obtained and all systems are negative except as noted in the HPI and PMH.   Patient's Health History    Past Medical History:  Diagnosis Date   Allergy    seasonal   Blind left eye    legally   Blood transfusion    2002   COPD (chronic obstructive pulmonary disease) (HCC)    Depression    Diabetes mellitus, type 2 (HCC)    Diverticulosis    Dyspnea    ED (erectile dysfunction)    Glaucoma    Hyperlipidemia    Hypertension    Insomnia    Internal and external bleeding hemorrhoids    Iron deficiency anemia    Lung cancer ([place])    Osteoarthritis    Parkinson's disease ([place]) 2014   Dr. [name] PCP  and Dr. [name] at [facility]   Paroxysmal atrial flutter [facility])    Rosacea    Tremor of both hands    Tubular adenoma of colon 03/28/2011    Past Surgical History:  Procedure Laterality Date   ARTHROSCOPIC REPAIR ACL     BALLOON DILATION  01/28/2021   Procedure: BALLOON DILATION;  Surgeon: [name], DO;  Location: [place];  Service: Pulmonary;;   BRONCHIAL BIOPSY  01/28/2021   Procedure: BRONCHIAL BIOPSIES;  Surgeon: [name], DO;  Location: [place];  Service: Pulmonary;;   CARDIOVERSION N/A 03/04/2021   Procedure: CARDIOVERSION;  Surgeon: [name], MD;  Location: [place];  Service: Cardiovascular;  Laterality: N/A;   [place]   right    COLONOSCOPY WITH PROPOFOL N/A 07/25/2015   Procedure: COLONOSCOPY WITH PROPOFOL;  Surgeon: [name], MD;  Location: [facility];  Service: Endoscopy;  Laterality: N/A;   CRYOTHERAPY  01/28/2021   Procedure: CRYOTHERAPY;  Surgeon: [name], DO;  Location: [place] [facility]COPY;  Service: Pulmonary;;   ESOPHAGOGASTRODUODENOSCOPY (EGD) WITH PROPOFOL N/A 07/25/2015   Procedure: ESOPHAGOGASTRODUODENOSCOPY (EGD) WITH PROPOFOL;  Surgeon: [name], MD;  Location: [facility];  Service: Endoscopy;  Laterality: N/A;   HEMOSTASIS CONTROL  01/28/2021   Procedure: HEMOSTASIS CONTROL;  Surgeon: [name], DO;  Location: [place];  Service: Pulmonary;;   INTRAVASCULAR PRESSURE WIRE/FFR STUDY N/A 01/22/2021   Procedure: INTRAVASCULAR PRESSURE WIRE/FFR STUDY;  Surgeon: [name], Peter M, MD;  Location: [place] CV LAB;  Service: Cardiovascular;  Laterality: N/A;   IR IMAGING GUIDED PORT INSERTION  03/18/2021   IR THORACENTESIS ASP PLEURAL SPACE W/IMG GUIDE  09/17/2020   IR THORACENTESIS ASP PLEURAL SPACE W/IMG GUIDE  10/25/2020   KNEE ARTHROSCOPY  1996   LEFT HEART CATH AND CORONARY ANGIOGRAPHY N/A 01/22/2021   Procedure: LEFT HEART CATH AND CORONARY ANGIOGRAPHY;  Surgeon: [name], Peter M, MD;  Location: [place] CV LAB;  Service: Cardiovascular;  Laterality: N/A;   PARTIAL COLECTOMY  2008  TEE WITHOUT CARDIOVERSION N/A 03/04/2021   Procedure: TRANSESOPHAGEAL ECHOCARDIOGRAM (TEE);  Surgeon: [name], MD;  Location: [facility]COPY;  Service: Cardiovascular;  Laterality: N/A;   THYROID CYST EXCISION     TONSILLECTOMY     VIDEO BRONCHOSCOPY Left 01/28/2021   Procedure: VIDEO BRONCHOSCOPY WITHOUT FLUORO;  Surgeon: [name], DO;  Location: [place];  Service: Pulmonary;  Laterality: Left;    Family History  Problem Relation Age of Onset   CAD Father    Alcohol abuse Father    Heart disease Father    COPD Mother    Asthma Mother    Emphysema Mother    Cancer  Maternal Grandmother    Colon cancer Neg Hx     Social History   Socioeconomic History   Marital status: Married    Spouse name: Not on file   Number of children: 1   Years of education: HS   Highest education level: Not on file  Occupational History   Occupation: retired    Fish farm manager: RETIRED  Tobacco Use   Smoking status: Former    Packs/day: 1.50    Years: 50.00    Pack years: 75.00    Types: Cigarettes    Quit date: 04/30/2015    Years since quitting: 5.9   Smokeless tobacco: Never   Tobacco comments:    Former smoker and stop sneaking cigarettes 03/14/21  Vaping Use   Vaping Use: Never used  Substance and Sexual Activity   Alcohol use: Not Currently    Comment: once yearly   Drug use: No   Sexual activity: Not on file  Other Topics Concern   Not on file  Social History Narrative   Drinks 1-2 Pepsi a day    Social Determinants of Health   Financial Resource Strain: Not on file  Food Insecurity: Not on file  Transportation Needs: Not on file  Physical Activity: Not on file  Stress: Not on file  Social Connections: Not on file  Intimate Partner Violence: Not on file     Physical Exam   Vitals:   04/09/21 0415 04/09/21 0500  BP: 110/62 99/61  Pulse: (!) 52 65  Resp: (!) 23 (!) 22  Temp:    SpO2: 100% 99%    CONSTITUTIONAL: Chronically ill-appearing, NAD NEURO/PSYCH:  Alert and oriented x 3, no focal deficits EYES:  eyes equal and reactive ENT/NECK:  no LAD, no JVD CARDIO: Tachycardic rate, well-perfused, normal S1 and S2 PULM:  CTAB no wheezing or rhonchi GI/GU:  non-distended, non-tender MSK/SPINE:  No gross deformities, no edema SKIN:  no rash, atraumatic   *Additional and/or pertinent findings included in MDM below  Diagnostic and Interventional Summary    EKG Interpretation  Date/Time:    Ventricular Rate:    PR Interval:    QRS Duration:   QT Interval:    QTC Calculation:   R Axis:     Text Interpretation:         Labs Reviewed   CBC - Abnormal; Notable for the following components:      Result Value   RBC 3.94 (*)    Hemoglobin 9.7 (*)    HCT 32.6 (*)    MCH 24.6 (*)    MCHC 29.8 (*)    RDW 28.8 (*)    All other components within normal limits  COMPREHENSIVE METABOLIC PANEL - Abnormal; Notable for the following components:   Potassium 3.0 (*)    Chloride 96 (*)    Calcium  8.8 (*)    Total Protein 5.1 (*)    Albumin 1.8 (*)    AST 14 (*)    Total Bilirubin 0.2 (*)    All other components within normal limits  PROTIME-INR - Abnormal; Notable for the following components:   Prothrombin Time 27.4 (*)    INR 2.6 (*)    All other components within normal limits  BRAIN NATRIURETIC PEPTIDE - Abnormal; Notable for the following components:   B Natriuretic Peptide 177.3 (*)    All other components within normal limits  URINALYSIS, ROUTINE W REFLEX MICROSCOPIC    DG Chest Port 1 View  Final Result      Medications  diltiazem (CARDIZEM) 125 mg in dextrose 5% 125 mL (1 mg/mL) infusion (5 mg/hr Intravenous New Bag/Given 04/09/21 0301)  sodium chloride flush (NS) 0.9 % injection 10-40 mL (has no administration in time range)  sodium chloride flush (NS) 0.9 % injection 10-40 mL (has no administration in time range)  Chlorhexidine Gluconate Cloth 2 % PADS 6 each (has no administration in time range)  sodium chloride 0.9 % bolus 500 mL (500 mLs Intravenous New Bag/Given 04/09/21 0346)     Procedures  /  Critical Care .Critical Care Performed by: Maudie Flakes, MD Authorized by: Maudie Flakes, MD   Critical care provider statement:    Critical care time (minutes):  45   Critical care was necessary to treat or prevent imminent or life-threatening deterioration of the following conditions: Atrial flutter with rapid ventricular response.   Critical care was time spent personally by me on the following activities:  Development of treatment plan with patient or surrogate, discussions with consultants, evaluation  of patient's response to treatment, examination of patient, ordering and review of laboratory studies, ordering and review of radiographic studies, ordering and performing treatments and interventions, pulse oximetry, re-evaluation of patient's condition and review of old charts  ED Course and Medical Decision Making  Initial Impression and Ddx Patient presenting with generalized weakness and atrial flutter with heart rate in the 150s.  Vital signs are overall reassuring, mildly tachypneic on exam.  Will start on dill drip to control rates, obtain screening labs and x-ray  Past medical/surgical history that increases complexity of ED encounter: History of COPD, lung cancer  Interpretation of Diagnostics I personally reviewed the EKG and Chest Xray and my interpretation is as follows: 2-1 atrial flutter, chest x-ray showing complete opacification of the left lung      Patient Reassessment and Ultimate Disposition/Management To be admitted to medicine for further management.  On reassessment heart rate improved into the 80s, still in atrial flutter.  Patient management required discussion with the following services or consulting groups:  Hospitalist Service  Complexity of Problems Addressed Acute illness or injury that poses threat of life of bodily function  Additional Data Reviewed and Analyzed Further history obtained from: Prior labs/imaging results and Records from care facility  Additional Factors Impacting ED Encounter Risk Consideration of hospitalization  Barth Kirks. [place] Small, [place] [email or web address]  Final Clinical Impressions(s) / ED Diagnoses     ICD-10-CM   1. Atrial flutter with rapid ventricular response (HCC)  I48.92       ED Discharge Orders     None        Discharge Instructions Discussed with and Provided to Patient:   Discharge Instructions   None      Maudie Flakes, MD 04/09/21 [phone]

## 2021-04-09 NOTE — Progress Notes (Addendum)
Yellow MEWS is driven by  HR. Pt arrived from ED with HR in the 160s Pt is on amiodarone

## 2021-04-09 NOTE — Consult Note (Addendum)
Cardiology Consultation:   Patient ID: [name] MRN: [id number]; DOB: 1945/09/18  Admit date: 04/09/2021 Date of Consult: 04/09/2021  PCP:  [name], MD   [facility] Providers Cardiologist:  [name], MD   {  Patient Profile:   James [name] is a 76 y.o. 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 Tamala Julian.  LHC in 12/2020 showed 50% stenosis of mid to distal left main that was not hemodynamically significant by flow wire assessment and 25% stenosis of proximal to mid RCA.  Medical therapy was recommended. Following this, he had a biopsy of a lung mass and was found to have stage III lung cancer. He has started radiation and chemotherapy.  Patient was admitted 02/11/2021 to 02/16/2021 for new onset atrial flutter in setting of COVID. Echo with LVEF of 55 to 60% with normal wall motion and mild LVH, normal RV, via bilateral atrial enlargement, small moderate pericardial effusion, and mild dilatation of the ascending aorta measuring 40 mm.    The patient underwent successful outpatient cardioversion on 03/04/2021.  Patient was maintaining sinus rhythm on metoprolol titrate and Cardizem when seen in atrial fibrillation clinic March 14, 2021.  Admitted 2/3-2/8 for generalized weakness/failure to thrive due to cancer and recent chemoradiation therapy.  Had multiple fall as well.  History of Present Illness:   Mr. Diltz presented overnight by EMS for palpitations and weakness. Found tachycardiac at 150s. EKG with 2:1 flutter. Started on IV cardizem >> HR now improved but continued to have intermittent  rapid ventricular rate at 150-160s. Currently heart rate in 50s to 60s on IV Cardizem 12.5 mg/h.  Wife is present at bedside who is Therapist, sports.  She reported that patient has intermittent episode of tachycardia.  Patient never felt palpitation however he gets severely fatigue and short of breath with elevated heart rate.  Patient has pulse ox and has noted elevated heart rate in 150s to 160s.  He was also getting additional metoprolol at [facility]  Reports compliance with Eliquis.  Last dose yesterday evening.  He is started on heparin here at 1259.  Supplemented for low potassium and magnesium. BNP 177 Hemoglobin 9.7  Chest X-ray: Complete opacification of the left lung, possibly representing a moderate to large pleural effusion with atelectasis or infiltrate. Findings may be associated with postobstructive pneumonitis related to known left perihilar mass.   Past Medical History:  Diagnosis Date   Allergy    seasonal   Blind left eye    legally   Blood transfusion    2002   COPD (chronic obstructive pulmonary disease) (HCC)    Depression    Diabetes mellitus, type 2 (HCC)    Diverticulosis    Dyspnea    ED (erectile dysfunction)    Glaucoma    Hyperlipidemia    Hypertension    Insomnia    Internal and external bleeding hemorrhoids    Iron deficiency anemia    Lung cancer ([place])    Osteoarthritis    Parkinson's disease ([place]) 2014   Dr. [name] PCP  and Dr. [name] at [facility]   Paroxysmal atrial flutter [facility])    Rosacea    Tremor of both  hands    Tubular adenoma of colon 03/28/2011    Past Surgical History:  Procedure Laterality Date   ARTHROSCOPIC REPAIR ACL     BALLOON DILATION  01/28/2021   Procedure: BALLOON DILATION;  Surgeon: [name], DO;  Location: [place] [facility];  Service: Pulmonary;;   BRONCHIAL BIOPSY  01/28/2021   Procedure: BRONCHIAL BIOPSIES;  Surgeon: [name], DO;  Location: [place];  Service: Pulmonary;;   CARDIOVERSION N/A 03/04/2021    Procedure: CARDIOVERSION;  Surgeon: [name], MD;  Location: [place];  Service: Cardiovascular;  Laterality: N/A;   [place]   right   COLONOSCOPY WITH PROPOFOL N/A 07/25/2015   Procedure: COLONOSCOPY WITH PROPOFOL;  Surgeon: [name], MD;  Location: [facility];  Service: Endoscopy;  Laterality: N/A;   CRYOTHERAPY  01/28/2021   Procedure: CRYOTHERAPY;  Surgeon: [name], DO;  Location: [place] ENDOSCOPY;  Service: Pulmonary;;   ESOPHAGOGASTRODUODENOSCOPY (EGD) WITH PROPOFOL N/A 07/25/2015   Procedure: ESOPHAGOGASTRODUODENOSCOPY (EGD) WITH PROPOFOL;  Surgeon: [name], MD;  Location: [facility];  Service: Endoscopy;  Laterality: N/A;   HEMOSTASIS CONTROL  01/28/2021   Procedure: HEMOSTASIS CONTROL;  Surgeon: [name], DO;  Location: [place];  Service: Pulmonary;;   INTRAVASCULAR PRESSURE WIRE/FFR STUDY N/A 01/22/2021   Procedure: INTRAVASCULAR PRESSURE WIRE/FFR STUDY;  Surgeon: [name], Peter M, MD;  Location: [place] CV LAB;  Service: Cardiovascular;  Laterality: N/A;   IR IMAGING GUIDED PORT INSERTION  03/18/2021   IR THORACENTESIS ASP PLEURAL SPACE W/IMG GUIDE  09/17/2020   IR THORACENTESIS ASP PLEURAL SPACE W/IMG GUIDE  10/25/2020   KNEE ARTHROSCOPY  1996   LEFT HEART CATH AND CORONARY ANGIOGRAPHY N/A 01/22/2021   Procedure: LEFT HEART CATH AND CORONARY ANGIOGRAPHY;  Surgeon: [name], Peter M, MD;  Location: [place] CV LAB;  Service: Cardiovascular;  Laterality: N/A;   PARTIAL COLECTOMY  2008   TEE WITHOUT CARDIOVERSION N/A 03/04/2021   Procedure: TRANSESOPHAGEAL ECHOCARDIOGRAM (TEE);  Surgeon: [name], MD;  Location: [facility] ENDOSCOPY;  Service: Cardiovascular;  Laterality: N/A;   THYROID CYST EXCISION     TONSILLECTOMY     VIDEO BRONCHOSCOPY Left 01/28/2021   Procedure: VIDEO BRONCHOSCOPY WITHOUT FLUORO;  Surgeon: [name], DO;  Location: [place];  Service: Pulmonary;   Laterality: Left;     Inpatient Medications: Scheduled Meds:  acidophilus  1 capsule Oral QODAY   arformoterol  15 mcg Nebulization Q12H   atorvastatin  20 mg Oral QHS   buPROPion  150 mg Oral Daily   Carbidopa-Levodopa ER  1.5 tablet Oral TID   Chlorhexidine Gluconate Cloth  6 each Topical Daily   donepezil  10 mg Oral QHS   famotidine  20 mg Oral Daily   levalbuterol  0.63 mg Nebulization TID   lidocaine  2 patch Transdermal Daily   omeprazole  20 mg Oral QPM   sertraline  150 mg Oral Daily   sodium chloride flush  10-40 mL Intracatheter Q12H   sodium chloride flush  3 mL Intravenous Q12H   umeclidinium bromide  1 puff Inhalation Daily   Continuous Infusions:  diltiazem (CARDIZEM) infusion 15 mg/hr (04/09/21 1129)   heparin 1,200 Units/hr (04/09/21 1259)   PRN Meds: acetaminophen **OR** acetaminophen, lidocaine-prilocaine, sodium chloride flush  Allergies:    Allergies  Allergen Reactions   Aspirin Other (See Comments)    GI bleed   Codeine Itching   Morphine Itching and  Nausea Only    Social History:   Social History   Socioeconomic History   Marital status: Married    Spouse name: Not on file   Number of children: 1   Years of education: HS   Highest education level: Not on file  Occupational History   Occupation: retired    Fish farm manager: RETIRED  Tobacco Use   Smoking status: Former    Packs/day: 1.50    Years: 50.00    Pack years: 75.00    Types: Cigarettes    Quit date: 04/30/2015    Years since quitting: 5.9   Smokeless tobacco: Never   Tobacco comments:    Former smoker and stop sneaking cigarettes 03/14/21  Vaping Use   Vaping Use: Never used  Substance and Sexual Activity   Alcohol use: Not Currently    Comment: once yearly   Drug use: No   Sexual activity: Not on file  Other Topics Concern   Not on file  Social History Narrative   Drinks 1-2 Pepsi a day    Social Determinants of Health   Financial Resource Strain: Not on file  Food  Insecurity: Not on file  Transportation Needs: Not on file  Physical Activity: Not on file  Stress: Not on file  Social Connections: Not on file  Intimate Partner Violence: Not on file    Family History:    Family History  Problem Relation Age of Onset   CAD Father    Alcohol abuse Father    Heart disease Father    COPD Mother    Asthma Mother    Emphysema Mother    Cancer Maternal Grandmother    Colon cancer Neg Hx      ROS:  Please see the history of present illness.  All other ROS reviewed and negative.     Physical Exam/Data:   Vitals:   04/09/21 1000 04/09/21 1030 04/09/21 1104 04/09/21 1200  BP: 113/74 119/79 106/65 105/68  Pulse: (!) 161 (!) 162 (!) 163 (!) 162  Resp: (!) 21 18 (!) 24 (!) 22  Temp:      TempSrc:      SpO2: 92% 94% 94% 97%  Weight:      Height:        Intake/Output Summary (Last 24 hours) at 04/09/2021 1309 Last data filed at 04/09/2021 1248 Gross per 24 hour  Intake 133.61 ml  Output --  Net 133.61 ml   Last 3 Weights 04/09/2021 04/03/2021 04/02/2021  Weight (lbs) 187 lb 6.3 oz 189 lb 2.5 oz 191 lb 12.8 oz  Weight (kg) 85 kg 85.8 kg 87 kg     Body mass index is 24.72 kg/m.  General: Chronically ill-appearing elderly male in no acute distress HEENT: normal Neck: no JVD Vascular: No carotid bruits; Distal pulses 2+ bilaterally Cardiac:  normal S1, S2; Irregular; no murmur  Lungs: Diminished breath sounds on left side with rales Abd: soft, nontender, no hepatomegaly  Ext: no edema Musculoskeletal:  No deformities, BUE and BLE strength normal and equal Skin: warm and dry  Neuro:  CNs 2-12 intact, no focal abnormalities noted Psych:  Normal affect   EKG:  The EKG was personally reviewed and demonstrates: Atrial flutter at 4-1 block Telemetry:  Telemetry was personally reviewed and demonstrates: Atrial flutter with intermittent elevated heart rate  Relevant CV Studies:  Echo 02/12/2021 1. Left ventricular ejection fraction, by  estimation, is 55 to 60%. The  left ventricle has normal function. The left ventricle has  no regional  wall motion abnormalities. There is mild concentric left ventricular  hypertrophy. Left ventricular diastolic  function could not be evaluated.   2. Right ventricular systolic function is normal. The right ventricular  size is normal. There is normal pulmonary artery systolic pressure.   3. Left atrial size was mild to moderately dilated.   4. Right atrial size was mild to moderately dilated.   5. A small pericardial effusion is present. There is no evidence of  cardiac tamponade.   6. The mitral valve is normal in structure. No evidence of mitral valve  regurgitation. No evidence of mitral stenosis.   7. The aortic valve is grossly normal. Aortic valve regurgitation is not  visualized. No aortic stenosis is present.   8. There is mild dilatation of the ascending aorta, measuring 40 mm.   9. The inferior vena cava is dilated in size with >50% respiratory  variability, suggesting right atrial pressure of 8 mmHg.   Comparison(s): Changes from prior study are noted. Now in atrial flutter.   INTRAVASCULAR PRESSURE WIRE/FFR STUDY  01/22/21  LEFT HEART CATH AND CORONARY ANGIOGRAPHY   Conclusion      Mid LM to Dist LM lesion is 50% stenosed.   Prox RCA to Mid RCA lesion is 25% stenosed.   The left ventricular systolic function is normal.   LV end diastolic pressure is normal.   The left ventricular ejection fraction is 55-65% by visual estimate.   Nonobstructive CAD. There is a 50% distal left main stenosis but this is not hemodynamically significant by flow wire assessment. Normal LV function Normal LVEDP   Plan: patient is cleared to proceed with evaluation and treatment of his lung mass. Risk factor modification.     Laboratory Data:  Chemistry Recent Labs  Lab 04/09/21 0245 04/09/21 0850  NA 137  --   K 3.0*  --   CL 96*  --   CO2 29  --   GLUCOSE 94  --   BUN 14  --    CREATININE 0.87  --   CALCIUM 8.8*  --   MG  --  1.5*  GFRNONAA >60  --   ANIONGAP 12  --     Recent Labs  Lab 04/09/21 0245  PROT 5.1*  ALBUMIN 1.8*  AST 14*  ALT 9  ALKPHOS 70  BILITOT 0.2*    Hematology Recent Labs  Lab 04/09/21 0245  WBC 10.3  RBC 3.94*  HGB 9.7*  HCT 32.6*  MCV 82.7  MCH 24.6*  MCHC 29.8*  RDW 28.8*  PLT 216   BNP Recent Labs  Lab 04/09/21 0246  BNP 177.3*    Radiology/Studies:  DG Chest Port 1 View  Result Date: 04/09/2021 CLINICAL DATA:  Flutter, weakness, tachycardia. EXAM: PORTABLE CHEST 1 VIEW COMPARISON:  03/29/2021. FINDINGS: The heart size and mediastinal contours are obscured. There is atherosclerotic calcification of the aorta. There is complete opacification of the left lung. Mild atelectasis is noted at the right lung base. No pneumothorax. A stable right chest port is noted. No acute osseous abnormality. IMPRESSION: Complete opacification of the left lung, possibly representing a moderate to large pleural effusion with atelectasis or infiltrate. Findings may be associated with postobstructive pneumonitis related to known left perihilar mass. Electronically Signed   By: Brett Fairy M.D.   On: 04/09/2021 03:03     Assessment and Plan:   Atrial flutter with rapid ventricular rate -Patient recently underwent successful cardioversion on March 04, 2021.  Most recently admitted earlier in February for dehydration.  Per wife, who is RN, reported intermittent elevation of heart rate on pulse ox.  Patient asymptomatic with tachycardia.  He becomes severe short of breath and fatigue.  He was getting extra metoprolol at [facility]. -Started on IV Cardizem with improved heart rate but still intermittent heart rate in 150-160s>> continue>>Will review antiarrhythmic option with MD -Last dose of Eliquis PM of 2/13, transition to heparin this afternoon at 1259 for possible thoracentesis  2.  Left Pleural effusion -Worsening interval from  prior -Consider thoracentesis with studies  3.  Chronic hypoxic respiratory failure -On oxygen supplement  3.  Lung cancer -S/p chemoradiation.  Has follow-up with oncology later this month  Dr. Percival Spanish to see.  Risk Assessment/Risk Scores:    CHA2DS2-VASc Score = 5   This indicates a 7.2% annual risk of stroke. The patient's score is based upon: CHF History: 0 HTN History: 1 Diabetes History: 1 Stroke History: 0 Vascular Disease History: 1 Age Score: 2 Gender Score: 0     For questions or updates, please contact [facility] Please consult [email or web address] for contact info under    Jarrett Soho, PA  04/09/2021 1:09 PM   History and all data above reviewed.  Patient examined.  I agree with the findings as above.  The patient is very confused and does not know where he is.   I tried to call his wife but did not get an answer.  Currently he denies any chest pain or SOB.   He was in rehab at [facility] but was brought to the ED via EMS when staff found a HR of 150 and he had weakness.  He has had mostly 2:1 flutter in the ED despite a Cardizem drip.     The patient exam reveals COR:   Tachycardic  ,  Lungs: Decreased breath sounds right with absent breath sounds left.    ,  Abd: Positive bowel sounds, no rebound no guarding, Ext   Trace edema  .  All available labs, radiology testing, previous records reviewed. Agree with documented assessment and plan. Atrial flutter with rapid rate:  Not slowing down with IV Dilt.  I will start IV amiodarone.  He has been on Eliquis since discharge.  CXR shows white out of the left lung which I suspect is related to layering effusion that has worsened as well as air space disease and post obstructive changes. Goals of therapy have been addressed.      Jeneen Rinks Tyger Wichman  5:16 PM  04/09/2021

## 2021-04-09 NOTE — Assessment & Plan Note (Addendum)
Follows with medical oncology outpatient, Dr. Julien Nordmann.  Despite thoracentesis, patient continues with persistent left sided opacification concerning for worsening/progressive malignancy.  Chemotherapy has been on hold due to functional decline per oncology notes. Plan outpatient palliative care to follow on discharge

## 2021-04-09 NOTE — ED Triage Notes (Signed)
Pt arrived via [facility] for cc of tachycardia/weakness from [facility]. Pt reporting "no complaints," staff at facility found pt HR to be maintaining in the 150s. Maintenance cardiac medication given, no improvement noted, EMS called. Pt alert and oriented, GCS 15, A&Ox4. PMH of lung cancer. MOST form at bedside. 18g left hand.   HR 150s BP 111/68 RR 16 SPO2 98% 4L  CBG 101

## 2021-04-09 NOTE — H&P (Signed)
History and Physical    Patient: James [name] [id number]:[id number] DOB: 03-13-1945 DOA: 04/09/2021 DOS: the patient was seen and examined on 04/09/2021 PCP: [name], MD  Patient coming from: [facility] via [facility]  Chief Complaint:  Chief Complaint  Patient presents with   Weakness   Tachycardia    HPI: James [name] is a 76 y.o. male with medical history significant of of stage IIIa non-small cell lung cancer, recently completed radiation (follows with Dr. Julien Nordmann, chemotherapy held due to functional decline), COPD, chronic respiratory failure with hypoxia (on 4 L supplemental oxygen via nasal cannula at home), paroxysmal atrial flutter on Eliquis, Parkinson's disease, diabetes mellitus type 2, hypertension, hyperlipidemia, iron deficiency anemia, depression who presented to ED from his facility due to severe tachycardia.  Patient reports that he has been having increased shortness of breath.  He had been at the nursing facility for the last month and notes that is also around the time that he stopped receiving chemotherapy treatments.  Over the last 3 months he reports that he is lost approximately 50 pounds. Patient had been on Ceftin since 2/8.  In route with EMS heart rate was noted to be in the 150s. In the ED patient was noted to be afebrile, heart rates elevated into the 150s in atrial flutter, respirations elevated to 24, blood pressure soft with maps greater than 65, and O2 saturations otherwise maintained on 4 L.  Labs significant for hemoglobin 9.7, potassium 3 albumin 1.8, BNP 177.3.  Chest x-ray noted complete opacification of the left lung possibly representing moderate to large pleural effusion with atelectasis/infiltrate, or possibly postobstructive pneumonitis.  He had initially been given 500 mL of normal saline IV fluid bolus and started on Cardizem drip.    Review of Systems: As mentioned in the history of present illness. All other systems reviewed and are  negative. Past Medical History:  Diagnosis Date   Allergy    seasonal   Blind left eye    legally   Blood transfusion    2002   COPD (chronic obstructive pulmonary disease) (HCC)    Depression    Diabetes mellitus, type 2 (HCC)    Diverticulosis    Dyspnea    ED (erectile dysfunction)    Glaucoma    Hyperlipidemia    Hypertension    Insomnia    Internal and external bleeding hemorrhoids    Iron deficiency anemia    Lung cancer ([place])    Osteoarthritis    Parkinson's disease ([place]) 2014   Dr. [name] PCP  and Dr. [name] at [facility]   Paroxysmal atrial flutter [facility])    Rosacea    Tremor of both hands    Tubular adenoma of colon 03/28/2011   Past Surgical History:  Procedure Laterality Date   ARTHROSCOPIC REPAIR ACL     BALLOON DILATION  01/28/2021   Procedure: BALLOON DILATION;  Surgeon: [name], DO;  Location: [place];  Service: Pulmonary;;   BRONCHIAL BIOPSY  01/28/2021   Procedure: BRONCHIAL BIOPSIES;  Surgeon: [name], DO;  Location: [place];  Service: Pulmonary;;   CARDIOVERSION N/A 03/04/2021   Procedure: CARDIOVERSION;  Surgeon: [name], MD;  Location: [place];  Service: Cardiovascular;  Laterality: N/A;   [place]   right   COLONOSCOPY WITH PROPOFOL N/A 07/25/2015   Procedure: COLONOSCOPY WITH PROPOFOL;  Surgeon: [name], MD;  Location: [facility];  Service: Endoscopy;  Laterality: N/A;   CRYOTHERAPY  01/28/2021   Procedure: CRYOTHERAPY;  Surgeon: [name], DO;  Location: [place] [facility];  Service: Pulmonary;;   ESOPHAGOGASTRODUODENOSCOPY (EGD) WITH PROPOFOL N/A 07/25/2015   Procedure: ESOPHAGOGASTRODUODENOSCOPY (EGD) WITH PROPOFOL;  Surgeon: [name], MD;  Location: [facility];  Service: Endoscopy;  Laterality: N/A;   HEMOSTASIS CONTROL  01/28/2021   Procedure: HEMOSTASIS CONTROL;  Surgeon: [name], DO;  Location: [place];  Service: Pulmonary;;    INTRAVASCULAR PRESSURE WIRE/FFR STUDY N/A 01/22/2021   Procedure: INTRAVASCULAR PRESSURE WIRE/FFR STUDY;  Surgeon: [name], Peter M, MD;  Location: [place] CV [facility];  Service: Cardiovascular;  Laterality: N/A;   IR IMAGING GUIDED PORT INSERTION  03/18/2021   IR THORACENTESIS ASP PLEURAL SPACE W/IMG GUIDE  09/17/2020   IR THORACENTESIS ASP PLEURAL SPACE W/IMG GUIDE  10/25/2020   KNEE ARTHROSCOPY  1996   LEFT HEART CATH AND CORONARY ANGIOGRAPHY N/A 01/22/2021   Procedure: LEFT HEART CATH AND CORONARY ANGIOGRAPHY;  Surgeon: [name], Peter M, MD;  Location: [place] [facility];  Service: Cardiovascular;  Laterality: N/A;   PARTIAL COLECTOMY  2008   TEE WITHOUT CARDIOVERSION N/A 03/04/2021   Procedure: TRANSESOPHAGEAL ECHOCARDIOGRAM (TEE);  Surgeon: [name], MD;  Location: [facility];  Service: Cardiovascular;  Laterality: N/A;   THYROID CYST EXCISION     TONSILLECTOMY     VIDEO BRONCHOSCOPY Left 01/28/2021   Procedure: VIDEO BRONCHOSCOPY WITHOUT FLUORO;  Surgeon: [name], DO;  Location: [place];  Service: Pulmonary;  Laterality: Left;   Social History:  reports that he quit smoking about 5 years ago. His smoking use included cigarettes. He has a 75.00 pack-year smoking history. He has never used smokeless tobacco. He reports that he does not currently use alcohol. He reports that he does not use drugs.  Allergies  Allergen Reactions   Aspirin Other (See Comments)    GI bleed   Codeine Itching   Morphine Itching and Nausea Only    Family History  Problem Relation Age of Onset   CAD Father    Alcohol abuse Father    Heart disease Father    COPD Mother    Asthma Mother    Emphysema Mother    Cancer Maternal Grandmother    Colon cancer Neg Hx     Prior to Admission medications   Medication Sig Start Date End Date Taking? Authorizing Provider  acetaminophen (TYLENOL) 325 MG tablet Take 2 tablets (650 mg total) by mouth every 6 (six) hours as needed for mild pain  (or Fever >/= 101). Patient taking differently: Take 650 mg by mouth every 12 (twelve) hours as needed for mild pain (or Fever >/= 101). 04/03/21  Yes Phillips Grout, MD  albuterol (VENTOLIN HFA) 108 (90 Base) MCG/ACT inhaler Inhale 2 puffs into the lungs every 6 (six) hours as needed for wheezing or shortness of breath.   Yes [provider]  Amino Acids-Protein Hydrolys (PRO-STAT) LIQD Take 30 mLs by mouth 2 (two) times daily.   Yes [provider]  apixaban (ELIQUIS) 5 MG TABS tablet Take 1 tablet (5 mg total) by mouth 2 (two) times daily. 02/16/21  Yes Dwyane Dee, MD  atorvastatin (LIPITOR) 20 MG tablet Take 20 mg by mouth at bedtime.  05/29/12  Yes [provider]  buPROPion (WELLBUTRIN XL) 150 MG 24 hr tablet Take 150 mg by mouth daily.   Yes [provider]  calcium carbonate (TUMS - DOSED IN MG ELEMENTAL CALCIUM) 500  MG chewable tablet Chew 1 tablet by mouth daily.   Yes [provider]  Carbidopa-Levodopa ER (SINEMET CR) 25-100 MG tablet controlled release Take 1.5 tablets by mouth in the morning, at noon, and at bedtime. Take at 0800, 1400 & 2200 03/17/21  Yes [provider]  cefUROXime (CEFTIN) 250 MG tablet Take 250 mg by mouth 2 (two) times daily with a meal.   Yes [provider]  cetirizine (ZYRTEC) 10 MG tablet Take 10 mg by mouth daily.   Yes [provider]  Cholecalciferol (VITAMIN D-3) 25 MCG (1000 UT) CAPS Take 1 capsule by mouth daily.   Yes [provider]  diltiazem (CARDIZEM CD) 300 MG 24 hr capsule Take 1 capsule (300 mg total) by mouth daily. 02/17/21  Yes Dwyane Dee, MD  donepezil (ARICEPT) 10 MG tablet Take 1 tablet (10 mg total) by mouth at bedtime. 05/08/20  Yes Star Age, MD  famotidine (PEPCID) 20 MG tablet Take 20 mg by mouth daily.   Yes [provider]  ferrous gluconate (FERGON) 324 MG tablet TAKE 1 TABLET BY MOUTH DAILY WITH BREAKFAST Patient taking differently: Take  324 mg by mouth daily with breakfast. 09/26/19  Yes Nicholas Lose, MD  formoterol (PERFOROMIST) 20 MCG/2ML nebulizer solution Take 2 mLs (20 mcg total) by nebulization 2 (two) times daily. 10/01/20  Yes Chesley Mires, MD  hydrOXYzine (ATARAX) 10 MG tablet Take 5 mg by mouth 2 (two) times daily.   Yes [provider]  ibuprofen (ADVIL) 800 MG tablet Take 1 tablet (800 mg total) by mouth every 8 (eight) hours as needed for moderate pain. 12/31/20  Yes Chesley Mires, MD  levalbuterol Penne Lash) 0.63 MG/3ML nebulizer solution Take 0.63 mg by nebulization in the morning, at noon, and at bedtime.   Yes [provider]  Lidocaine 4 % PTCH Apply 2 patches topically daily.   Yes [provider]  lidocaine-prilocaine (EMLA) cream Apply 1 application topically as needed. Patient taking differently: Apply 1 application topically as needed (access port). 02/26/21  Yes Heilingoetter, Cassandra L, PA-C  metFORMIN (GLUCOPHAGE) 500 MG tablet Take 1 tablet (500 mg total) by mouth 2 (two) times daily with a meal. 01/25/21  Yes [name], Peter M, MD  metoprolol tartrate (LOPRESSOR) 25 MG tablet Take 1 tablet (25 mg total) by mouth 2 (two) times daily. Hold if heart rate less than 60 or blood pressure top number is less than 95 02/27/21  Yes Goodrich, Callie E, PA-C  mirtazapine (REMERON) 15 MG tablet Take 15 mg by mouth at bedtime. 06/04/20  Yes [provider]  modafinil (PROVIGIL) 100 MG tablet Take 1 tablet (100 mg total) by mouth daily. 01/10/21  Yes Chesley Mires, MD  Multiple Vitamins-Minerals (MULTIVITAMIN WITH MINERALS) tablet Take 1 tablet by mouth daily. Centrum   Yes [provider]  Omega-3 Fatty Acids (FISH OIL) 1000 MG CAPS Take 1 capsule by mouth daily.   Yes [provider]  omeprazole (PRILOSEC OTC) 20 MG tablet Take 20 mg by mouth every evening.   Yes [provider]  Probiotic Product (PROBIOTIC DAILY) CAPS Take 1 capsule by mouth every other day.   Yes  [provider]  prochlorperazine (COMPAZINE) 10 MG tablet Take 1 tablet (10 mg total) by mouth every 6 (six) hours as needed for nausea or vomiting. 02/01/21  Yes Curt Bears, MD  sertraline (ZOLOFT) 100 MG tablet Take 100 mg by mouth daily. 06/07/12  Yes [provider]  sertraline (ZOLOFT) 50 MG  tablet Take 50 mg by mouth daily.   Yes [provider]  Tiotropium Bromide Monohydrate (SPIRIVA RESPIMAT) 2.5 MCG/ACT AERS Inhale 2 puffs into the lungs daily.   Yes [provider]  vitamin C (ASCORBIC ACID) 500 MG tablet Take 500 mg by mouth daily.   Yes [provider]  calcium-vitamin D (OSCAL WITH D) 250-125 MG-UNIT per tablet Take 1 tablet by mouth daily.    [provider]  Nyoka Cowden Tea 315 MG CAPS Take 315 mg by mouth daily.    [provider]  MEGARED OMEGA-3 KRILL OIL PO Take 1 capsule by mouth daily.    [provider]  nitroGLYCERIN (NITROSTAT) 0.4 MG SL tablet Place 0.4 mg under the tongue every 5 (five) minutes as needed for chest pain.    [provider]  OXYGEN Inhale 4-6 L into the lungs continuous.    [provider]    Physical Exam: Vitals:   04/09/21 0600 04/09/21 0615 04/09/21 0630 04/09/21 0700  BP: 113/64 101/60 109/62   Pulse: 74 81 97 (!) 157  Resp: (!) 22 (!) 22 (!) 23 (!) 21  Temp:      TempSrc:      SpO2: 98% 100% 99% 98%  Weight:      Height:       Exam  Constitutional: Chronically ill-appearing elderly male Eyes: PERRL, lids and conjunctivae normal ENMT: Mucous membranes are moist. Posterior pharynx clear of any exudate or lesions.   Neck: normal, supple, no masses, no thyromegaly Respiratory: Tachypneic with decreased aeration noted along the left lung field. Cardiovascular: Tachycardic no significant lower extremity edema appreciated. Abdomen: no tenderness, no masses palpated. Bowel sounds positive.  Musculoskeletal: no clubbing / cyanosis. No joint deformity upper  and lower extremities. Good ROM, no contractures. Normal muscle tone.  Skin: no rashes, lesions, ulcers. No induration Neurologic: CN 2-12 grossly intact. Sensation intact, DTR normal. Strength 5/5 in all 4.  Psychiatric: Normal judgment and insight. Alert and oriented x 3. Normal mood.    Data Reviewed:   Initial EKG noted wide QRS tachycardia at 154 bpm  Assessment and Plan: * Atrial flutter with rapid ventricular response (HCC)- (present on admission) Patient presented after being found to be in atrial flutter with heart rates into the 150s.  Once in the ED patient was started on a Cardizem drip with some improvement in heart rates.CHA2DS2-VASc score = 5. -Admit to a progressive bed -Continue Cardizem drip -Goal to maintain at least potassium  4 and magnesium 2.  Will replace electrolytes to goal -Held Eliquis -Heparin per pharmacy for possible need of procedure  Pleural effusion- (present on admission) Patient presented with opacification of the left lung field.  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 -IR to be consulted for possible thoracentesis with fluid analysis  Chronic respiratory failure with hypoxia ([place])- (present on admission) Patient noted to have O2 saturations maintained on home 4 L of oxygen. -Continuous pulse oximetry with nasal cannula oxygen maintain O2 saturations greater than 90%  Hypokalemia- (present on admission) Acute.  On admission potassium noted to be 3. -Give potassium chloride 60 mEq p.o. -Check magnesium level and replace as needed  Protein calorie malnutrition ([place])- (present on admission) On admission albumin was noted to be low at 1.8.  Parkinson's disease ([place])- (present on admission) - Continue Sinemet  Hyperlipidemia associated with type 2 diabetes mellitus ([place])- (present on admission) Patient has known history of diabetes, but is well  controlled last hemoglobin A1c was 5 on  2/5.  Admission glucose 94.Marland Kitchen  Home medication regimen includes metformin 500 mg twice daily and atorvastatin 20 mg nightly. -Hold metformin -Monitor glucose levels and consider need of starting sliding scale of insulin if glucose trends greater than 180 -Continue atorvastatin  Iron deficiency anemia- (present on admission) Chronic.  Hemoglobin 9.7 which appears near patient's baseline. -Continue to monitor  Primary cancer of left lower lobe of lung ([place])- (present on admission) - Dr. Julien Nordmann added to the treatment team - Palliative care has been formally consulted and after discussions with patient and family he was made a partial code with orders to DO NOT INTUBATE       Advance Care Planning:   Code Status: Partial Code after discussions with palliative care.  Consults: Cardiology, IR  Family Communication: none  Severity of Illness: The appropriate patient status for this patient is INPATIENT. Inpatient status is judged to be reasonable and necessary in order to provide the required intensity of service to ensure the patient's safety. The patient's presenting symptoms, physical exam findings, and initial radiographic and laboratory data in the context of their chronic comorbidities is felt to place them at high risk for further clinical deterioration. Furthermore, it is not anticipated that the patient will be medically stable for discharge from the hospital within 2 midnights of admission.   * I certify that at the point of admission it is my clinical judgment that the patient will require inpatient hospital care spanning beyond 2 midnights from the point of admission due to high intensity of service, high risk for further deterioration and high frequency of surveillance required.*  Author: Norval Morton, MD 04/09/2021 7:50 AM  For on call review [email or web address].

## 2021-04-09 NOTE — Assessment & Plan Note (Addendum)
Chronic.  Hemoglobin stable and at baseline.

## 2021-04-09 NOTE — Assessment & Plan Note (Addendum)
Continues to maintain adequate oxygenation on home 4 L of oxygen.

## 2021-04-09 NOTE — Consult Note (Signed)
Consultation Note Date: 04/09/2021   Patient Name: James [name]  DOB: Oct 24, 1945  MRN: [id number]  Age / Sex: 76 y.o., male  PCP: [name], MD Referring Physician: Norval Morton, MD  Reason for Consultation: Establishing goals of care, "Lung cancer who presents in atrial flutter with pleural effusion"  HPI/Patient Profile: 76 y.o. male  with past medical history of stage IIIa non-small cell lung cancer, recently completed radiation (follows with Dr. Julien Nordmann, chemotherapy held due to functional decline), COPD, chronic respiratory failure with hypoxia (on 4 L supplemental oxygen via nasal cannula at home), paroxysmal atrial flutter on Eliquis, Parkinson's disease, diabetes mellitus type 2, hypertension, hyperlipidemia, iron deficiency anemia, depression presented to the ED on 04/09/21 from [facility] rehab with tachycardia and weakness. Patient states the weakness is not acute. Patient was admitted on 04/09/2021 with atrial flutter with RVR, pleural effusion, hypokalemia, protein calorie malnutrition.   Clinical Assessment and Goals of Care: I have reviewed medical records including EPIC notes, labs, and imaging. Received report from primary RN - acute concern of tachycardia despite being at max on Cardizem drip - attending MD has been notified.    Went to visit patient at bedside - spouse/Lucy was present. [name] is a retired Therapist, sports of 56 years. Patient was lying in bed awake, alert, oriented, and able to participate in conversation; however, he does fall asleep intermittently during my visit. No signs or non-verbal gestures of pain or discomfort noted. No respiratory distress, increased work of breathing, or secretions noted. Patient denies pain or shortness of breath. He endorses generalized discomfort. He is on 4L O2 [place]. Per cardiac monitor at bedside, HR in 150-160s.  Met with patient and [name] to discuss  diagnosis, prognosis, GOC, EOL wishes, disposition, and options.  I introduced Palliative Medicine as specialized medical care for people living with serious illness. It focuses on providing relief from the symptoms and stress of a serious illness. The goal is to improve quality of life for both the patient and the family.  We discussed a brief life review of the patient as well as functional and nutritional status. Patient and [name] have been married 54 years - they have one son together. Prior to hospitalization, patient was at [facility] receiving rehab. Prior to this, he was living in a private residence with [name]; however, she tells me it became hard to care for him due to his increased weakness and recurrent falls. [name] states at rehab patient was mostly bed/wheelchair bound but she did notice improvement with his strength and felt he was making progress. Patient and [name] confirm he has had a poor appetite. Albumen noted to be 1.8 on 04/09/21. Patient had his last radiation treatment on 03/27/21 and chemo was not given that day - they did not understand why - per chart review patient was too weak and frail. Patient and wife confirm goal is to follow up with oncology as previously planned.   We discussed patient's current illness and what it means in the larger context of  patient's on-going co-morbidities. Patient and [name] understand that Parkinson's is a progressive, non-curable disease underlying the patient's current acute medical conditions. Reviewed that acute events such as why he was admitted can cause Parkinson's to progress at a faster rate, which may not result in a return to previous baseline. [name] states the patient has had heart issues since his COVID infection in December 2022. Natural disease trajectory and expectations at EOL were discussed. I attempted to elicit values and goals of care important to the patient. The difference between aggressive medical intervention and comfort care was  considered in light of the patient's goals of care.   Discussed that the goal of rehab is improvement/stabalization of functional status,  which can be a difficult goal to meet for patients with advanced illness and multiple medical conditions. Reviewed what is needed for someone to have a positive rehabilitation experience to include adequate nutritional intake as well as willingness/ability to participate. Reviewed patient currently is not eating enough to sustain him long term. [name] feels he was improving at rehab and also states they met with a dietitian at the facility who was starting him on a diet to supplement his protein, which was supposed to start today but unfortunately he was admitted. Goal is for patient to return to rehab to hopefully continue to regain strength so he can return home with Lucy.  Hospice and Palliative Care services outpatient were explained and offered. They are agreeable to outpatient Palliative Care to follow.  Advance directives, concepts specific to code status, artificial feeding and hydration, and rehospitalization were considered and discussed. Living Will and HCPOA is located in [place]. Introduced, reviewed, and completed MOST form as outlined under recommendation section below.   Encouraged patient/family to consider DNR/DNI status understanding evidenced based poor outcomes in similar hospitalized patient, as the cause of arrest is likely associated with advanced chronic/terminal illness rather than an easily reversible acute cardio-pulmonary event. I explained that DNR/DNI does not change the medical plan and it only comes into effect after a person has arrested (died).  It is a protective measure to keep [place] from harming the patient in their last moments of life. Patient/Family were not agreeable to DNR; however, are clear he would not want intubation. Discussed partial code in context of current situation and medical recommendation for DNR/DNI was given. Patient is  agreeable to partial code with understanding that he would receive CPR, defibrillation, and ACLS medications, but not intubation.   Patient and wife would appreciate chaplain visit - he is of [place] and [name] is of [place].   Discussed with patient/family the importance of continued conversation with each other and the medical providers regarding overall plan of care and treatment options, ensuring decisions are within the context of the patients values and GOCs.    Questions and concerns were addressed. The patient/family was encouraged to call with questions and/or concerns. PMT card was provided.  Spoke with [facility] liaison to request outpatient PC follow  up sooner than later as patient is at high risk for decline after discharge - he is clear he does not want rehospitalization.   Primary Decision Maker: PATIENT     SUMMARY OF RECOMMENDATIONS   Continue full scope treatment Now limited code - no intubation Goal is for patient to improve so he can return to rehab with long term goal to return home. Patient/wife agreeable for outpatient Palliative Care to follow. Patient is clear he does not want to be rehospitalized after  discharge - he would prefer to be kept comfortable with hospice MOST form completed as follows: CPR/Do not intubate, Comfort Care, Determine use or limitations of antibiotics when infection occurs, IVF and feeding tube for defined trial period. Original form left at patient's bedside and copy was made that will be scanned into Vynca/ACP tab Patient would be hospice appropriate if patient/family agreeable Recommend continued education around code status and artifical nutrition/hydration to ensure goal of comfort care at home for EOL can be met [facility] consulted for: Outpatient Palliative Care referral - wife requesting [facility] consulted for: wife's request for emotional support PMT will continue to follow peripherally. If  there are any imminent needs please call the service directly  Code Status/Advance Care Planning: Limited code - no intubation   Palliative Prophylaxis:  Aspiration, Bowel Regimen, Frequent Pain Assessment, Oral Care, and Turn Reposition  Additional Recommendations (Limitations, Scope, Preferences): Full Scope Treatment  Psycho-social/Spiritual:  Desire for further Chaplaincy support:yes Created space and opportunity for patient and family to express thoughts and feelings regarding patient's current medical situation.  Emotional support and therapeutic listening provided.  Prognosis:  < 6 months  Discharge Planning: [place] for rehab with Palliative care service follow-up      Primary Diagnoses: Present on Admission:  Atrial flutter with rapid ventricular response (HCC)  Pleural effusion  Protein calorie malnutrition ([place])  Hyperlipidemia associated with type 2 diabetes mellitus ([place])  Parkinson's disease ([place])  Hypokalemia  Chronic respiratory failure with hypoxia (HCC)  Iron deficiency anemia  COPD with emphysema ([place])  Primary cancer of left lower lobe of lung ([place])   I have reviewed the medical record, interviewed the patient and family, and examined the patient. The following aspects are pertinent.  Past Medical History:  Diagnosis Date   Allergy    seasonal   Blind left eye    legally   Blood transfusion    2002   COPD (chronic obstructive pulmonary disease) (HCC)    Depression    Diabetes mellitus, type 2 (HCC)    Diverticulosis    Dyspnea    ED (erectile dysfunction)    Glaucoma    Hyperlipidemia    Hypertension    Insomnia    Internal and external bleeding hemorrhoids    Iron deficiency anemia    Lung cancer ([place])    Osteoarthritis    Parkinson's disease ([place]) 2014   Dr. [name] PCP  and Dr. [name] at [facility]   Paroxysmal atrial flutter [facility])    Rosacea    Tremor of both hands    Tubular adenoma of colon 03/28/2011   Social  History   Socioeconomic History   Marital status: Married    Spouse name: Not on file   Number of children: 1   Years of education: HS   Highest education level: Not on file  Occupational History   Occupation: retired    Fish farm manager: RETIRED  Tobacco Use   Smoking status: Former    Packs/day: 1.50    Years: 50.00    Pack years: 75.00    Types: Cigarettes    Quit date: 04/30/2015    Years since quitting: 5.9   Smokeless tobacco: Never   Tobacco comments:    Former smoker and stop sneaking cigarettes 03/14/21  Vaping Use   Vaping Use: Never used  Substance and Sexual Activity   Alcohol use: Not Currently    Comment: once yearly   Drug use: No   Sexual activity: Not  on file  Other Topics Concern   Not on file  Social History Narrative   Drinks 1-2 Pepsi a day    Social Determinants of Health   Financial Resource Strain: Not on file  Food Insecurity: Not on file  Transportation Needs: Not on file  Physical Activity: Not on file  Stress: Not on file  Social Connections: Not on file   Family History  Problem Relation Age of Onset   CAD Father    Alcohol abuse Father    Heart disease Father    COPD Mother    Asthma Mother    Emphysema Mother    Cancer Maternal Grandmother    Colon cancer Neg Hx    Scheduled Meds:  acidophilus  1 capsule Oral QODAY   arformoterol  15 mcg Nebulization Q12H   atorvastatin  20 mg Oral QHS   buPROPion  150 mg Oral Daily   Carbidopa-Levodopa ER  1.5 tablet Oral TID   Chlorhexidine Gluconate Cloth  6 each Topical Daily   donepezil  10 mg Oral QHS   famotidine  20 mg Oral Daily   levalbuterol  0.63 mg Nebulization TID   lidocaine  2 patch Transdermal Daily   omeprazole  20 mg Oral QPM   sertraline  150 mg Oral Daily   sodium chloride flush  10-40 mL Intracatheter Q12H   sodium chloride flush  3 mL Intravenous Q12H   umeclidinium bromide  1 puff Inhalation Daily   Continuous Infusions:  diltiazem (CARDIZEM) infusion 15 mg/hr  (04/09/21 1129)   heparin     PRN Meds:.acetaminophen **OR** acetaminophen, lidocaine-prilocaine, sodium chloride flush Medications Prior to Admission:  Prior to Admission medications   Medication Sig Start Date End Date Taking? Authorizing Provider  acetaminophen (TYLENOL) 325 MG tablet Take 2 tablets (650 mg total) by mouth every 6 (six) hours as needed for mild pain (or Fever >/= 101). Patient taking differently: Take 650 mg by mouth every 12 (twelve) hours as needed for mild pain (or Fever >/= 101). 04/03/21  Yes Phillips Grout, MD  albuterol (VENTOLIN HFA) 108 (90 Base) MCG/ACT inhaler Inhale 2 puffs into the lungs every 6 (six) hours as needed for wheezing or shortness of breath.   Yes [provider]  Amino Acids-Protein Hydrolys (PRO-STAT) LIQD Take 30 mLs by mouth 2 (two) times daily.   Yes [provider]  apixaban (ELIQUIS) 5 MG TABS tablet Take 1 tablet (5 mg total) by mouth 2 (two) times daily. 02/16/21  Yes Dwyane Dee, MD  atorvastatin (LIPITOR) 20 MG tablet Take 20 mg by mouth at bedtime.  05/29/12  Yes [provider]  buPROPion (WELLBUTRIN XL) 150 MG 24 hr tablet Take 150 mg by mouth daily.   Yes [provider]  calcium carbonate (TUMS - DOSED IN MG ELEMENTAL CALCIUM) 500 MG chewable tablet Chew 1 tablet by mouth daily.   Yes [provider]  Carbidopa-Levodopa ER (SINEMET CR) 25-100 MG tablet controlled release Take 1.5 tablets by mouth in the morning, at noon, and at bedtime. Take at 0800, 1400 & 2200 03/17/21  Yes [provider]  cefUROXime (CEFTIN) 250 MG tablet Take 250 mg by mouth 2 (two) times daily with a meal.   Yes [provider]  cetirizine (ZYRTEC) 10 MG tablet Take 10 mg by mouth daily.   Yes [provider]  Cholecalciferol (VITAMIN D-3) 25 MCG (1000 UT) CAPS Take 1 capsule by mouth daily.   Yes [provider]  diltiazem (CARDIZEM CD) 300 MG 24 hr capsule Take 1 capsule (300 mg  total) by mouth daily. 02/17/21  Yes Dwyane Dee, MD  donepezil (ARICEPT) 10 MG tablet Take 1 tablet (10 mg total) by mouth at bedtime. 05/08/20  Yes Star Age, MD  famotidine (PEPCID) 20 MG tablet Take 20 mg by mouth daily.   Yes [provider]  ferrous gluconate (FERGON) 324 MG tablet TAKE 1 TABLET BY MOUTH DAILY WITH BREAKFAST Patient taking differently: Take 324 mg by mouth daily with breakfast. 09/26/19  Yes Nicholas Lose, MD  formoterol (PERFOROMIST) 20 MCG/2ML nebulizer solution Take 2 mLs (20 mcg total) by nebulization 2 (two) times daily. 10/01/20  Yes Chesley Mires, MD  hydrOXYzine (ATARAX) 10 MG tablet Take 5 mg by mouth 2 (two) times daily.   Yes [provider]  ibuprofen (ADVIL) 800 MG tablet Take 1 tablet (800 mg total) by mouth every 8 (eight) hours as needed for moderate pain. 12/31/20  Yes Chesley Mires, MD  levalbuterol Penne Lash) 0.63 MG/3ML nebulizer solution Take 0.63 mg by nebulization in the morning, at noon, and at bedtime.   Yes [provider]  Lidocaine 4 % PTCH Apply 2 patches topically daily.   Yes [provider]  lidocaine-prilocaine (EMLA) cream Apply 1 application topically as needed. Patient taking differently: Apply 1 application topically as needed (access port). 02/26/21  Yes Heilingoetter, Cassandra L, PA-C  metFORMIN (GLUCOPHAGE) 500 MG tablet Take 1 tablet (500 mg total) by mouth 2 (two) times daily with a meal. 01/25/21  Yes [name], Peter M, MD  metoprolol tartrate (LOPRESSOR) 25 MG tablet Take 1 tablet (25 mg total) by mouth 2 (two) times daily. Hold if heart rate less than 60 or blood pressure top number is less than 95 02/27/21  Yes Goodrich, Callie E, PA-C  mirtazapine (REMERON) 15 MG tablet Take 15 mg by mouth at bedtime. 06/04/20  Yes [provider]  modafinil (PROVIGIL) 100 MG tablet Take 1 tablet (100 mg total) by mouth daily. 01/10/21  Yes Chesley Mires, MD  Multiple Vitamins-Minerals (MULTIVITAMIN WITH MINERALS)  tablet Take 1 tablet by mouth daily. Centrum   Yes [provider]  Omega-3 Fatty Acids (FISH OIL) 1000 MG CAPS Take 1 capsule by mouth daily.   Yes [provider]  omeprazole (PRILOSEC OTC) 20 MG tablet Take 20 mg by mouth every evening.   Yes [provider]  Probiotic Product (PROBIOTIC DAILY) CAPS Take 1 capsule by mouth every other day.   Yes [provider]  prochlorperazine (COMPAZINE) 10 MG tablet Take 1 tablet (10 mg total) by mouth every 6 (six) hours as needed for nausea or vomiting. 02/01/21  Yes Curt Bears, MD  sertraline (ZOLOFT) 100 MG tablet Take 100 mg by mouth daily. 06/07/12  Yes [provider]  sertraline (ZOLOFT) 50 MG tablet Take 50 mg by mouth daily.   Yes [provider]  Tiotropium Bromide Monohydrate (SPIRIVA RESPIMAT) 2.5 MCG/ACT AERS Inhale 2 puffs into the lungs daily.   Yes [provider]  vitamin C (ASCORBIC ACID) 500 MG tablet Take 500 mg by mouth daily.   Yes [provider]  calcium-vitamin D (OSCAL WITH D) 250-125 MG-UNIT per tablet Take 1 tablet by mouth daily.    [provider]  Nyoka Cowden Tea 315 MG CAPS Take 315 mg by mouth daily.    [provider]  MEGARED OMEGA-3 KRILL OIL PO Take 1 capsule by mouth daily.    [provider]  nitroGLYCERIN (NITROSTAT) 0.4 MG SL tablet Place 0.4 mg under the tongue every 5 (five) minutes as needed for chest pain.    [provider]  OXYGEN Inhale 4-6 L into the lungs continuous.    [provider]   Allergies  Allergen Reactions   Aspirin Other (See Comments)    GI bleed   Codeine Itching   Morphine Itching and Nausea Only   Review of Systems  Constitutional:  Positive for activity change, appetite change and fatigue.  Respiratory:  Negative for shortness of breath.   Gastrointestinal:  Negative for nausea and vomiting.  Neurological:  Positive for weakness.  All other systems reviewed and are  negative.  Physical Exam Vitals and nursing note reviewed.  Constitutional:      General: He is not in acute distress.    Appearance: He is ill-appearing.  Cardiovascular:     Rate and Rhythm: Tachycardia present.  Pulmonary:     Effort: No respiratory distress.  Skin:    General: Skin is warm and dry.  Neurological:     Mental Status: He is oriented to person, place, and time. He is lethargic.     Motor: Weakness present.  Psychiatric:        Attention and Perception: Attention normal.        Behavior: Behavior is cooperative.        Cognition and Memory: Cognition and memory normal.    Vital Signs: BP 105/68    Pulse (!) 162    Temp 98.2 F (36.8 C) (Oral)    Resp (!) 22    Ht $R'6\' 1"'dW$  (1.854 m)    Wt 85 kg    SpO2 97%    BMI 24.72 kg/m          SpO2: SpO2: 97 % O2 Device:SpO2: 97 % O2 Flow Rate: .O2 Flow Rate (L/min): 4 L/min  IO: Intake/output summary:  Intake/Output Summary (Last 24 hours) at 04/09/2021 1258 Last data filed at 04/09/2021 1248 Gross per 24 hour  Intake 133.61 ml  Output --  Net 133.61 ml    LBM:   Baseline Weight: Weight: 85 kg Most recent weight: Weight: 85 kg     Palliative Assessment/Data: PPS 40-50%     Time In: 1145 Time Out: 1300 Time Total: 75 minutes  Greater than 50%  of this time was spent counseling and coordinating care related to the above assessment and plan.  Signed by: Lin Landsman, NP   Please contact Palliative Medicine Team phone at [phone] for questions and concerns.  For individual provider: See Shea Evans

## 2021-04-09 NOTE — Progress Notes (Signed)
°   04/09/21 1524  TOC ED Mini Assessment  TOC Time spent with patient (minutes): 30  PING Used in TOC Assessment Yes  Admission or Readmission Diverted No  What brought you to the Emergency Department?  Weakness  Barriers to Discharge Continued Medical Work up  Patient states their goals for this hospitalization and ongoing recovery are: Plans to discharge with [facility] for outpatient palliative.

## 2021-04-09 NOTE — Progress Notes (Signed)
This chaplain responded to PMT consult for spiritual care and emotional support. The Pt. is asleep for most of the visit and will occasionally interacts with the Pt. Wife-Lucy as she talks to the chaplain.  The chaplain listens reflectively as [name] shares the sequence of events that led up to the Pt. hospitalization and her role of caregiver. [name] describes how her experience as a Therapist, sports often makes it hard to step away and rest herself. [name] shares she remains hopeful the Pt. will be strong enough to take a trip to, one of their favorite places, the [place] thanked the chaplain for listening and extended an invitation for F/U spiritual care. The chaplain shared prayer with [name].  Chaplain Sallyanne Kuster [phone]

## 2021-04-09 NOTE — ED Notes (Signed)
Pt provided with tooth brush and tooth paste as pt wanted to brush teeth.

## 2021-04-09 NOTE — Assessment & Plan Note (Addendum)
Continue Sinemet CR 25-100mg  1.5 tablets TID, Donepezil 10 mg p.o. nightly

## 2021-04-09 NOTE — Assessment & Plan Note (Addendum)
Patient presented with opacification of the left lung field.  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 s/p IR left thoracentesis 2/15 with removal 2 L amber/blood-tinged fluid with repeat chest x-ray thereafter showing continued complete opacification of the left hemithorax.  CT chest with contrast 2/15 with complete opacification left hemithorax with abrupt cut off left mainstem bronchus concerning for mucous plug versus tumor invasion.  Pleural fluid studies consistent with exudative effusion and cytology with no malignant cells, although finding malignant cells and pleural fluid is low yield.  Pulmonology was consulted for consideration of bronchoscopy, given his progressive malignancy, poor health recommended supportive care and comfort measures. Evaluated by palliative care with plan to be followed by palliative care on discharge with eventual transition to hospice

## 2021-04-09 NOTE — Progress Notes (Signed)
ANTICOAGULATION CONSULT NOTE - Initial Consult  Pharmacy Consult for heparin Indication: atrial fibrillation  Allergies  Allergen Reactions   Aspirin Other (See Comments)    GI bleed   Codeine Itching   Morphine Itching and Nausea Only    Patient Measurements: Height: 6\' 1"  (185.4 cm) Weight: 85 kg (187 lb 6.3 oz) IBW/kg (Calculated) : 79.9 Heparin Dosing Weight: 85 kg  Vital Signs: BP: 119/70 (02/14 2100) Pulse Rate: 163 (02/14 2100)  Labs: Recent Labs    04/09/21 0245 04/09/21 2028  HGB 9.7*  --   HCT 32.6*  --   PLT 216  --   APTT  --  54*  LABPROT 27.4*  --   INR 2.6*  --   HEPARINUNFRC  --  0.46  CREATININE 0.87  --      Estimated Creatinine Clearance: 82.9 mL/min (by C-G formula based on SCr of 0.87 mg/dL).   Medical History: Past Medical History:  Diagnosis Date   Allergy    seasonal   Blind left eye    legally   Blood transfusion    2002   COPD (chronic obstructive pulmonary disease) (HCC)    Depression    Diabetes mellitus, type 2 (HCC)    Diverticulosis    Dyspnea    ED (erectile dysfunction)    Glaucoma    Hyperlipidemia    Hypertension    Insomnia    Internal and external bleeding hemorrhoids    Iron deficiency anemia    Lung cancer ([place])    Osteoarthritis    Parkinson's disease ([place]) 2014   Dr. [name] PCP  and Dr. [name] at [facility]   Paroxysmal atrial flutter [facility])    Rosacea    Tremor of both hands    Tubular adenoma of colon 03/28/2011    Assessment: 57 YOM presenting with weakness and tachycardia, hx of afib on Eliquis PTA with last dose 2/13 @2330 , chronic anemia stable, plts 216.  aPTT subtherapeutic at 54, heparin level has not yet correlated with aPTT. Will increase rate. No s/sx of bleeding noted.   Goal of Therapy:  Heparin level 0.3-0.7 units/ml aPTT 66-102 seconds Monitor platelets by anticoagulation protocol: Yes   Plan:  Increase heparin gtt to 1500 units/hr F/up 8 hour aPTT/HL Monitor CBC, aPTT/HL, and s/sx of  bleeding daily  Joseph Art, Pharm.D. PGY-1 Pharmacy Resident [phone] 04/09/2021 9:15 PM

## 2021-04-09 NOTE — Progress Notes (Deleted)
°   04/09/21 1524  TOC ED Mini Assessment  TOC Time spent with patient (minutes): 30  PING Used in TOC Assessment Yes  Admission or Readmission Diverted No  What brought you to the Emergency Department?  Weakness  Barriers to Discharge Continued Medical Work up  Patient states their goals for this hospitalization and ongoing recovery are: Plans to discharge with [facility] for home hospice.

## 2021-04-09 NOTE — Assessment & Plan Note (Addendum)
Patient has known history of diabetes, but is well controlled last hemoglobin A1c was 5 on 2/5.  Admission glucose 94.Marland Kitchen  Home medication regimen includes metformin 500 mg twice daily and atorvastatin 20 mg nightly. Continue atorvastatin 20 mg p.o. daily and metformin 500 mg p.o. twice daily.

## 2021-04-09 NOTE — Assessment & Plan Note (Addendum)
Body mass index is 24.72 kg/m. On admission albumin was noted to be low at 1.8. Nutrition Status: Nutrition Problem: Severe Malnutrition Etiology: chronic illness (stage IIIa non-small cell lung cancer) Signs/Symptoms: severe muscle depletion, energy intake < 75% for > or equal to 1 month, percent weight loss Percent weight loss: 6 % (in one month) Interventions: Boost Breeze, Liberalize Diet, MVI, Prostat Dietitian was consulted and followed during hospital course, continue to encourage increased oral intake, supplementation.

## 2021-04-10 ENCOUNTER — Inpatient Hospital Stay: Payer: Medicare Other | Admitting: Internal Medicine

## 2021-04-10 ENCOUNTER — Inpatient Hospital Stay: Payer: Medicare Other

## 2021-04-10 ENCOUNTER — Inpatient Hospital Stay (HOSPITAL_COMMUNITY): Payer: Medicare Other

## 2021-04-10 DIAGNOSIS — E43 Unspecified severe protein-calorie malnutrition: Secondary | ICD-10-CM | POA: Insufficient documentation

## 2021-04-10 DIAGNOSIS — K219 Gastro-esophageal reflux disease without esophagitis: Secondary | ICD-10-CM | POA: Diagnosis present

## 2021-04-10 DIAGNOSIS — I4892 Unspecified atrial flutter: Secondary | ICD-10-CM | POA: Diagnosis not present

## 2021-04-10 HISTORY — PX: IR THORACENTESIS ASP PLEURAL SPACE W/IMG GUIDE: IMG5380

## 2021-04-10 LAB — BASIC METABOLIC PANEL
Anion gap: 11 (ref 5–15)
BUN: 11 mg/dL (ref 8–23)
CO2: 30 mmol/L (ref 22–32)
Calcium: 8.6 mg/dL — ABNORMAL LOW (ref 8.9–10.3)
Chloride: 97 mmol/L — ABNORMAL LOW (ref 98–111)
Creatinine, Ser: 0.79 mg/dL (ref 0.61–1.24)
GFR, Estimated: >60 mL/min (ref >60)
Glucose, Bld: 100 mg/dL — ABNORMAL HIGH (ref 70–99)
Potassium: 3.4 mmol/L — ABNORMAL LOW (ref 3.5–5.1)
Sodium: 138 mmol/L (ref 135–145)

## 2021-04-10 LAB — CBC
HCT: 31.1 % — ABNORMAL LOW (ref 39.0–52.0)
Hemoglobin: 9.5 g/dL — ABNORMAL LOW (ref 13.0–17.0)
MCH: 25.2 pg — ABNORMAL LOW (ref 26.0–34.0)
MCHC: 30.5 g/dL (ref 30.0–36.0)
MCV: 82.5 fL (ref 80.0–100.0)
Platelets: 172 10*3/uL (ref 150–400)
RBC: 3.77 MIL/uL — ABNORMAL LOW (ref 4.22–5.81)
RDW: 28.7 % — ABNORMAL HIGH (ref 11.5–15.5)
WBC: 7.2 10*3/uL (ref 4.0–10.5)
nRBC: 0 % (ref 0.0–0.2)

## 2021-04-10 LAB — PROTEIN, PLEURAL OR PERITONEAL FLUID: Total protein, fluid: <3 g/dL

## 2021-04-10 LAB — BODY FLUID CELL COUNT WITH DIFFERENTIAL
Eos, Fluid: 0 %
Lymphs, Fluid: 28 %
Monocyte-Macrophage-Serous Fluid: 25 % — ABNORMAL LOW (ref 50–90)
Neutrophil Count, Fluid: 47 % — ABNORMAL HIGH (ref 0–25)
Total Nucleated Cell Count, Fluid: 580 cu mm (ref 0–1000)

## 2021-04-10 LAB — GLUCOSE, PLEURAL OR PERITONEAL FLUID: Glucose, Fluid: 92 mg/dL

## 2021-04-10 LAB — LACTATE DEHYDROGENASE, PLEURAL OR PERITONEAL FLUID: LD, Fluid: 144 U/L — ABNORMAL HIGH (ref 3–23)

## 2021-04-10 LAB — APTT: aPTT: 68 seconds — ABNORMAL HIGH (ref 24–36)

## 2021-04-10 LAB — HEPARIN LEVEL (UNFRACTIONATED): Heparin Unfractionated: 0.36 IU/mL (ref 0.30–0.70)

## 2021-04-10 LAB — MAGNESIUM: Magnesium: 1.7 mg/dL (ref 1.7–2.4)

## 2021-04-10 LAB — ALBUMIN, PLEURAL OR PERITONEAL FLUID: Albumin, Fluid: <1.5 g/dL

## 2021-04-10 IMAGING — US IR THORACENTESIS ASP PLEURAL SPACE W/IMG GUIDE
1 series · 3 of 3 positions shown · non-contrast
Comparison: none

INDICATION: Patient with history of non-small cell lung cancer, COPD,
Parkinson's disease, atrial flutter, left pleural effusion. Request
received for diagnostic and therapeutic left thoracentesis.

[Series 1: ir (person_name)/(person_name) · 3 of 3 slices shown]
[im 1/3]
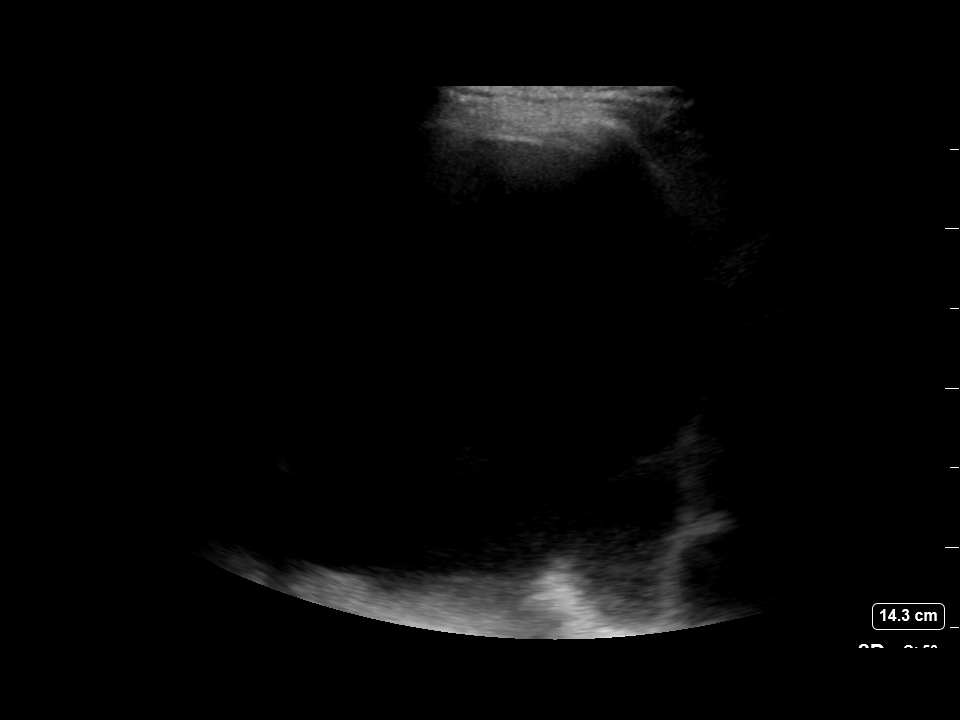
[im 2/3]
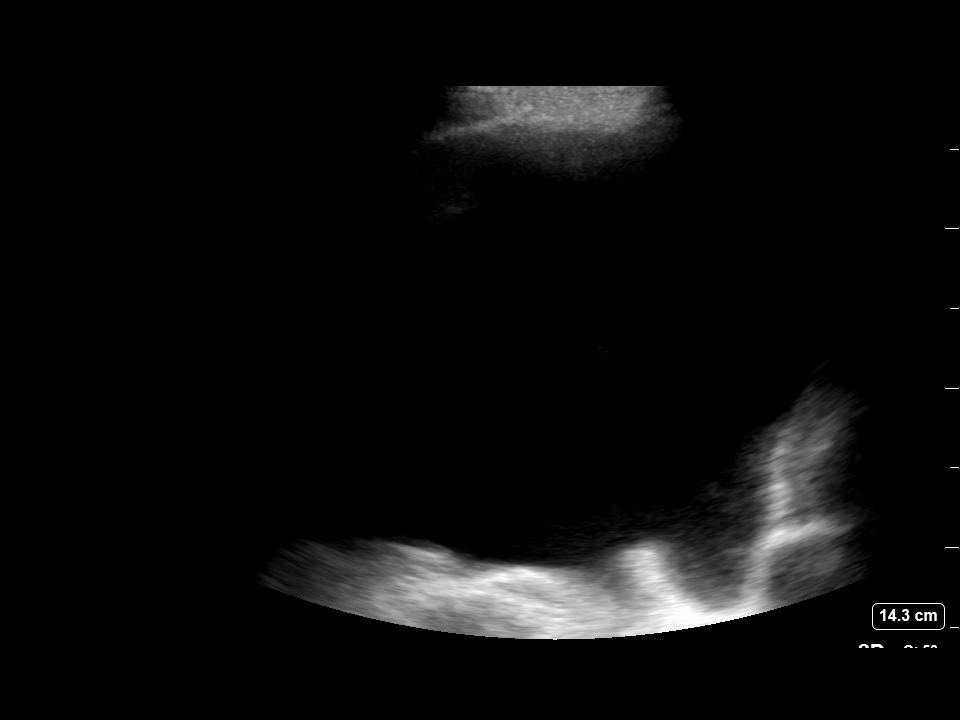
[im 3/3]
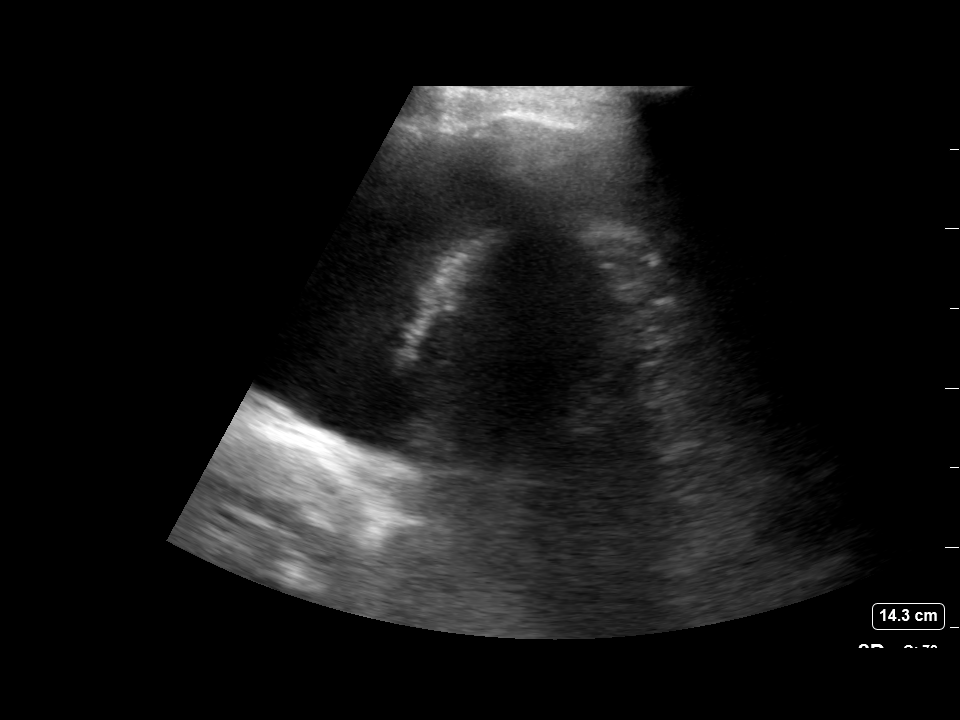

[3 of 3 positions shown; findings below may reference images not displayed]

EXAM:
ULTRASOUND GUIDED DIAGNOSTIC AND THERAPEUTIC LEFT THORACENTESIS

MEDICATIONS:
10 ml 1% lidocaine

COMPLICATIONS:
None immediate.

PROCEDURE:
An ultrasound guided thoracentesis was thoroughly discussed with the
patient and questions answered. The benefits, risks, alternatives
and complications were also discussed. The patient understands and
wishes to proceed with the procedure. Written consent was obtained.

Ultrasound was performed to localize and mark an adequate pocket of
fluid in the left chest. The area was then prepped and draped in the
normal sterile fashion. 1% Lidocaine was used for local anesthesia.
Under ultrasound guidance a 6 Fr Safe-T-Centesis catheter was
introduced. Thoracentesis was performed. The catheter was removed
and a dressing applied.
FINDINGS: A total of approximately 2 liters of amber/blood-tinged fluid was
removed. Samples were sent to the laboratory as requested by the
clinical team.
IMPRESSION: Successful ultrasound guided diagnostic and therapeutic left
thoracentesis yielding 2 liters of pleural fluid.

## 2021-04-10 IMAGING — CT CT CHEST W/ CM
2 of 3 series · 14 of 36 positions shown, 17 images · IV contrast (agent unspecified)
Comparison: [DATE]

CLINICAL DATA: Pleural effusion, suspected malignancy

EXAM:
CT CHEST WITH CONTRAST
TECHNIQUE: Multidetector CT imaging of the chest was performed during
intravenous contrast administration.

[Series 3: thorax 2.0 i31f 2 · axial · 0.74mm/px · z∈[+953,+1265]mm · 11 of 184 slices shown, 14 images]
[im 14/184  mediastinal]
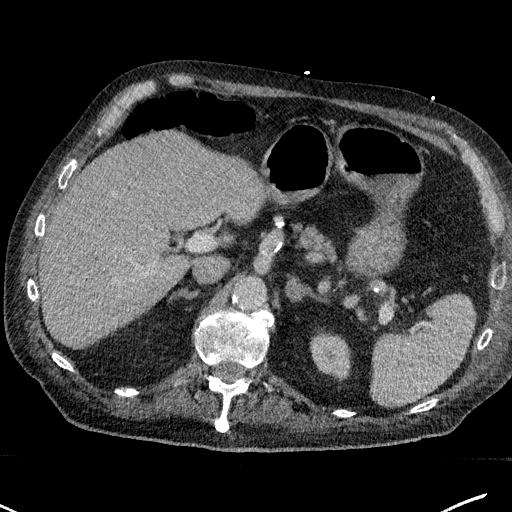
[im 14/184  lung]
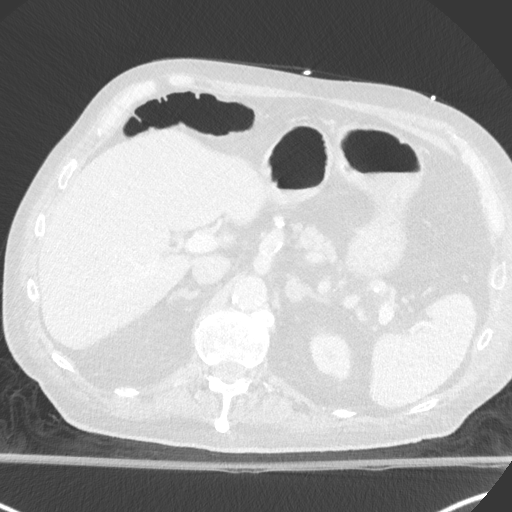
[im 28/184  lung]
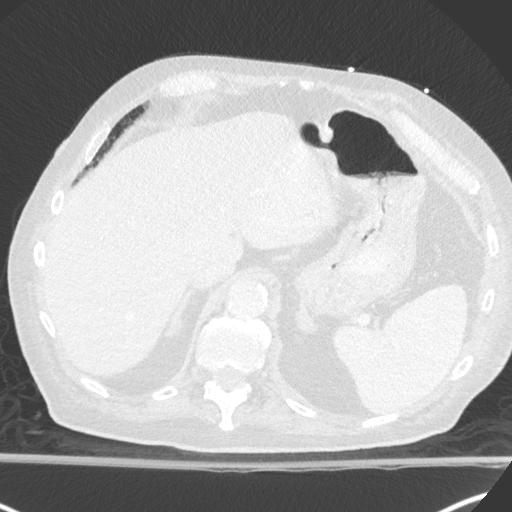
[im 41/184  lung]
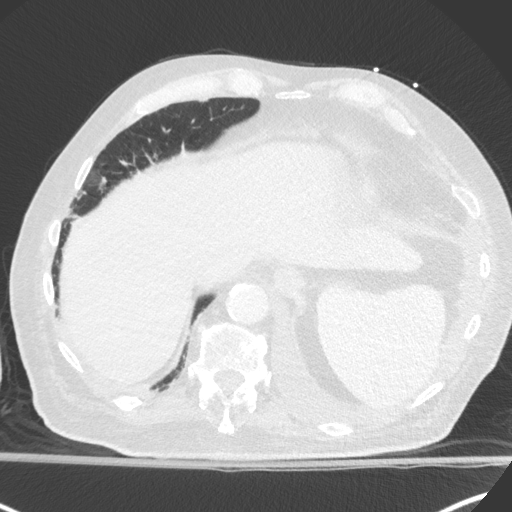
[im 62/184  lung]
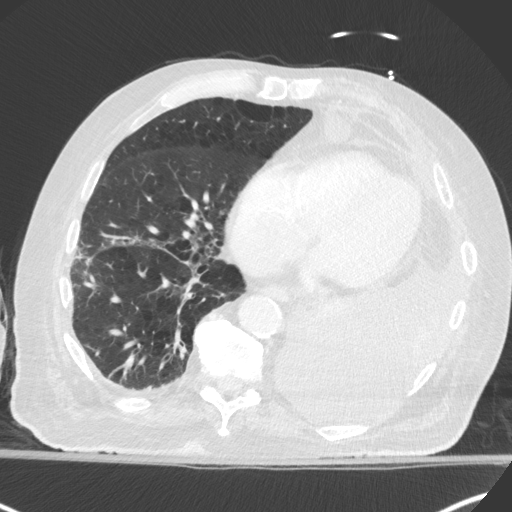
[im 75/184  mediastinal]
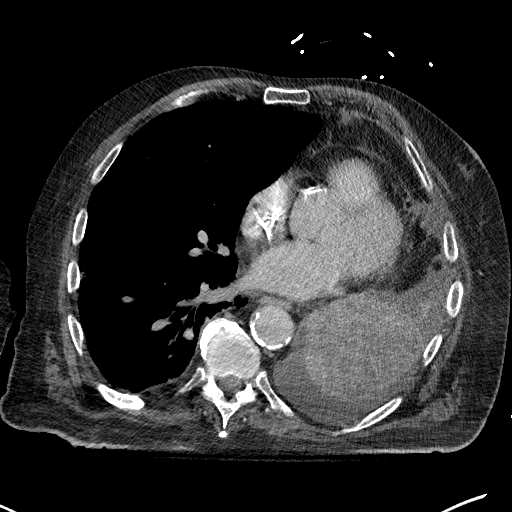
[im 75/184  lung]
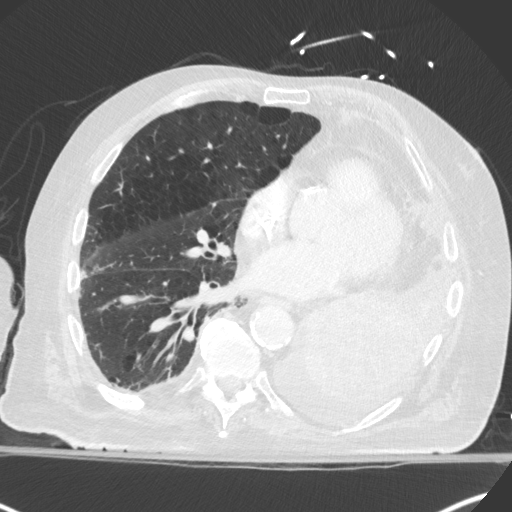
[im 95/184  lung]
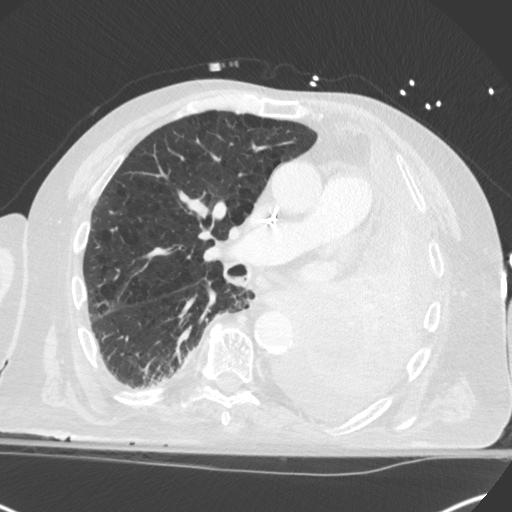
[im 109/184  lung]
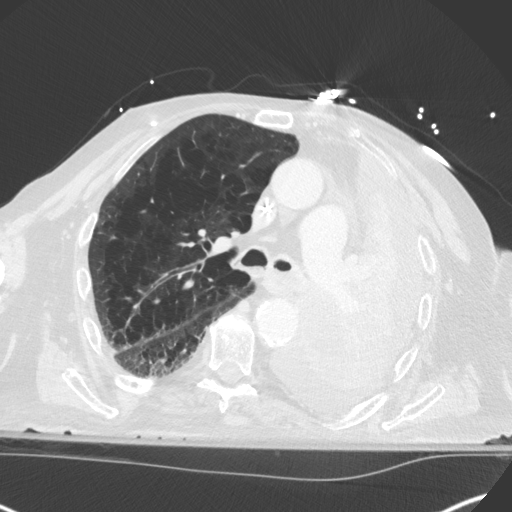
[im 123/184  lung]
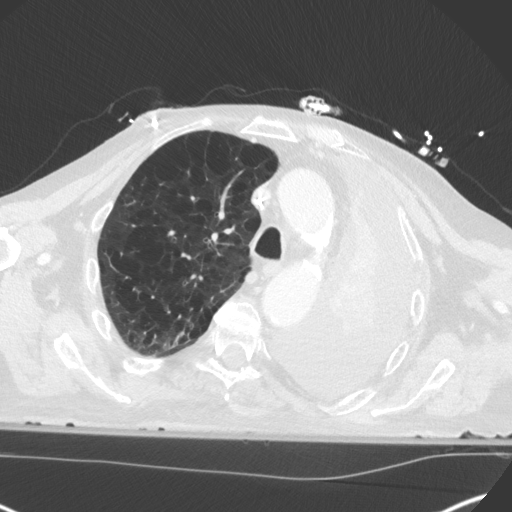
[im 143/184  mediastinal]
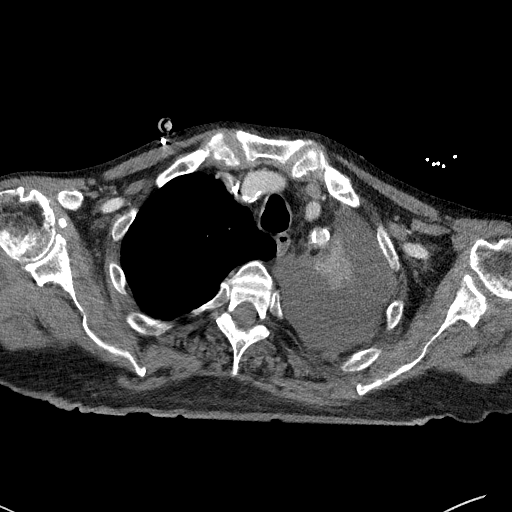
[im 143/184  lung]
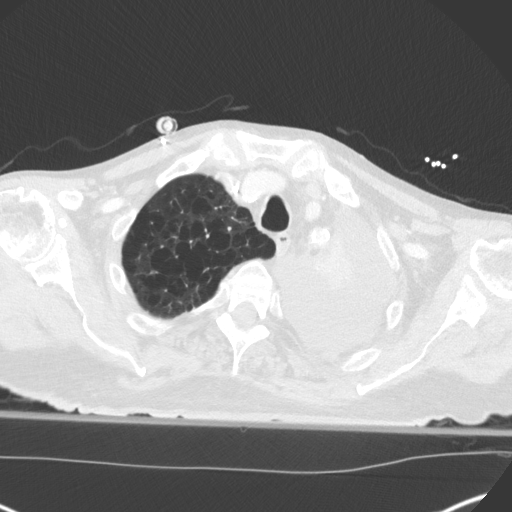
[im 156/184  lung]
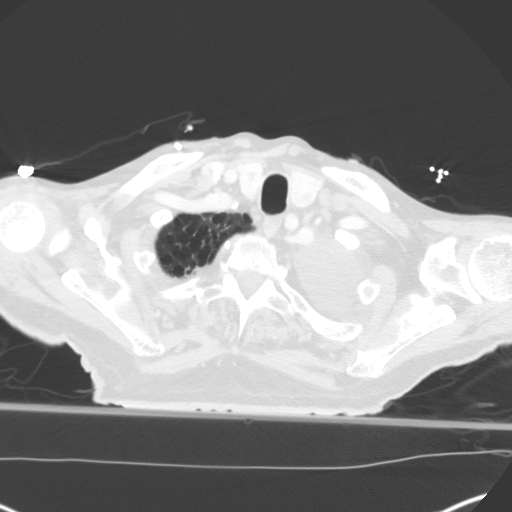
[im 170/184  lung]
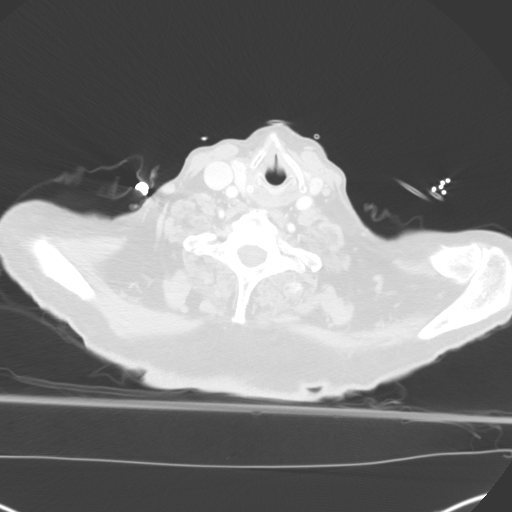

[Series 5: coronal · coronal · 0.72mm/px · 3 of 151 slices shown]
[im 31/151  lung]
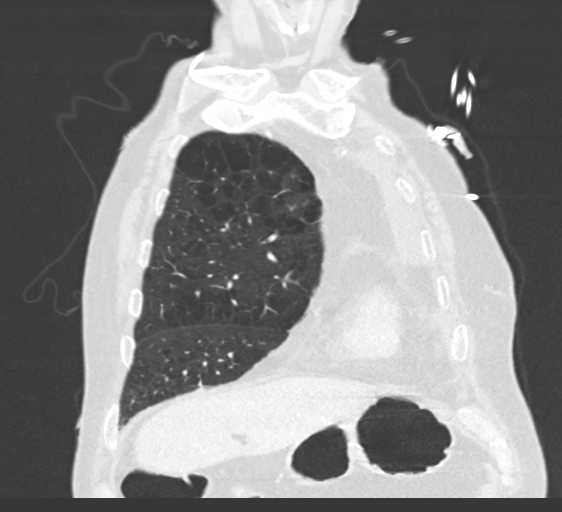
[im 61/151  lung]
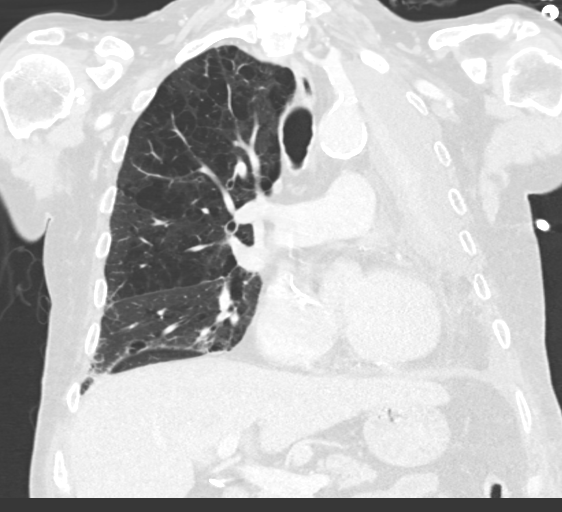
[im 91/151  lung]
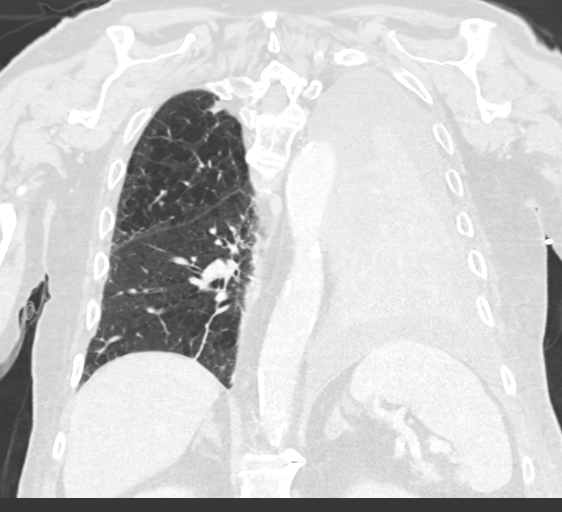

[14 of 36 positions shown; findings below may reference images not displayed]

RADIATION DOSE REDUCTION: This exam was performed according to the
departmental dose-optimization program which includes automated
exposure control, adjustment of the mA and/or kV according to
patient size and/or use of iterative reconstruction technique.

CONTRAST:  75mL OMNIPAQUE IOHEXOL 300 MG/ML  SOLN
FINDINGS: Cardiovascular: Atherosclerotic calcifications aorta and coronary
arteries. Aorta normal caliber. Vascular structures grossly patent.
Heart unremarkable. No pericardial effusion.

Mediastinum/Nodes: Esophagus normal appearance. Diffusely
heterogeneous thyroid lobes with multiple nodules up to 15 mm
diameter; This has been evaluated on previous imaging, thyroid
ultrasound [DATE]. (Ref: [HOSPITAL]. [DATE]):
143-50). Base of cervical region otherwise normal appearance. Few
scattered normal size mediastinal lymph nodes without thoracic
adenopathy.

Lungs/Pleura: Complete opacification of the LEFT hemithorax by large
pleural effusion. Complete atelectasis of LEFT lung. No definite
pleural thickening or mass. No discrete pulmonary mass identified.
Low-attenuation in LEFT lower lobe question drowned lung. Again
identified abrupt cut off of air within LEFT mainstem bronchus, may
be related to mucous plugging though cannot exclude tumor with this
appearance. Mucus within RIGHT mainstem bronchus. Underlying
emphysematous changes in RIGHT lung. Subpleural area of pleural
thickening versus nodularity posterior RIGHT upper lobe 18 x 7 mm
image 46, minimally more prominent than on previous exam.
Peribronchial thickening with subsegmental atelectasis in RIGHT
middle lobe and RIGHT lower lobe. No RIGHT lung infiltrate, pleural
effusion, or pneumothorax.

Upper Abdomen: Diffuse thickening of the adrenal glands. Minimal
diverticulosis at splenic flexure of colon. Remaining visualized
upper abdomen unremarkable.

Musculoskeletal: Diffuse osseous demineralization.
IMPRESSION: Complete opacification of the LEFT hemithorax by large pleural
effusion with complete atelectasis of LEFT lung and question drowned
lung appearance of particularly the LEFT lower lobe.

Again identified abrupt cut off of air within LEFT mainstem
bronchus, may be related to mucous plugging though cannot exclude
tumor with this appearance.

Subpleural area of pleural thickening versus nodularity posterior
RIGHT upper lobe 18 x 7 mm, minimally more prominent than on
previous exam; recommend continued attention on follow-up imaging.

Diffuse thickening of the adrenal glands bilaterally question
adrenal hyperplasia.

Coronary arterial calcifications.

Diffusely heterogeneous thyroid lobes with multiple nodules up to 15
mm diameter; This has been evaluated on previous imaging, thyroid
ultrasound [DATE].

Aortic Atherosclerosis ([PV]-[PV]) and Emphysema ([PV]-[PV]).

## 2021-04-10 IMAGING — DX DG CHEST 1V
1 series · 1 of 1 positions shown · non-contrast
Comparison: [DATE].

CLINICAL DATA: Status post left thoracentesis.

EXAM:
CHEST  1 VIEW

[chest ap]
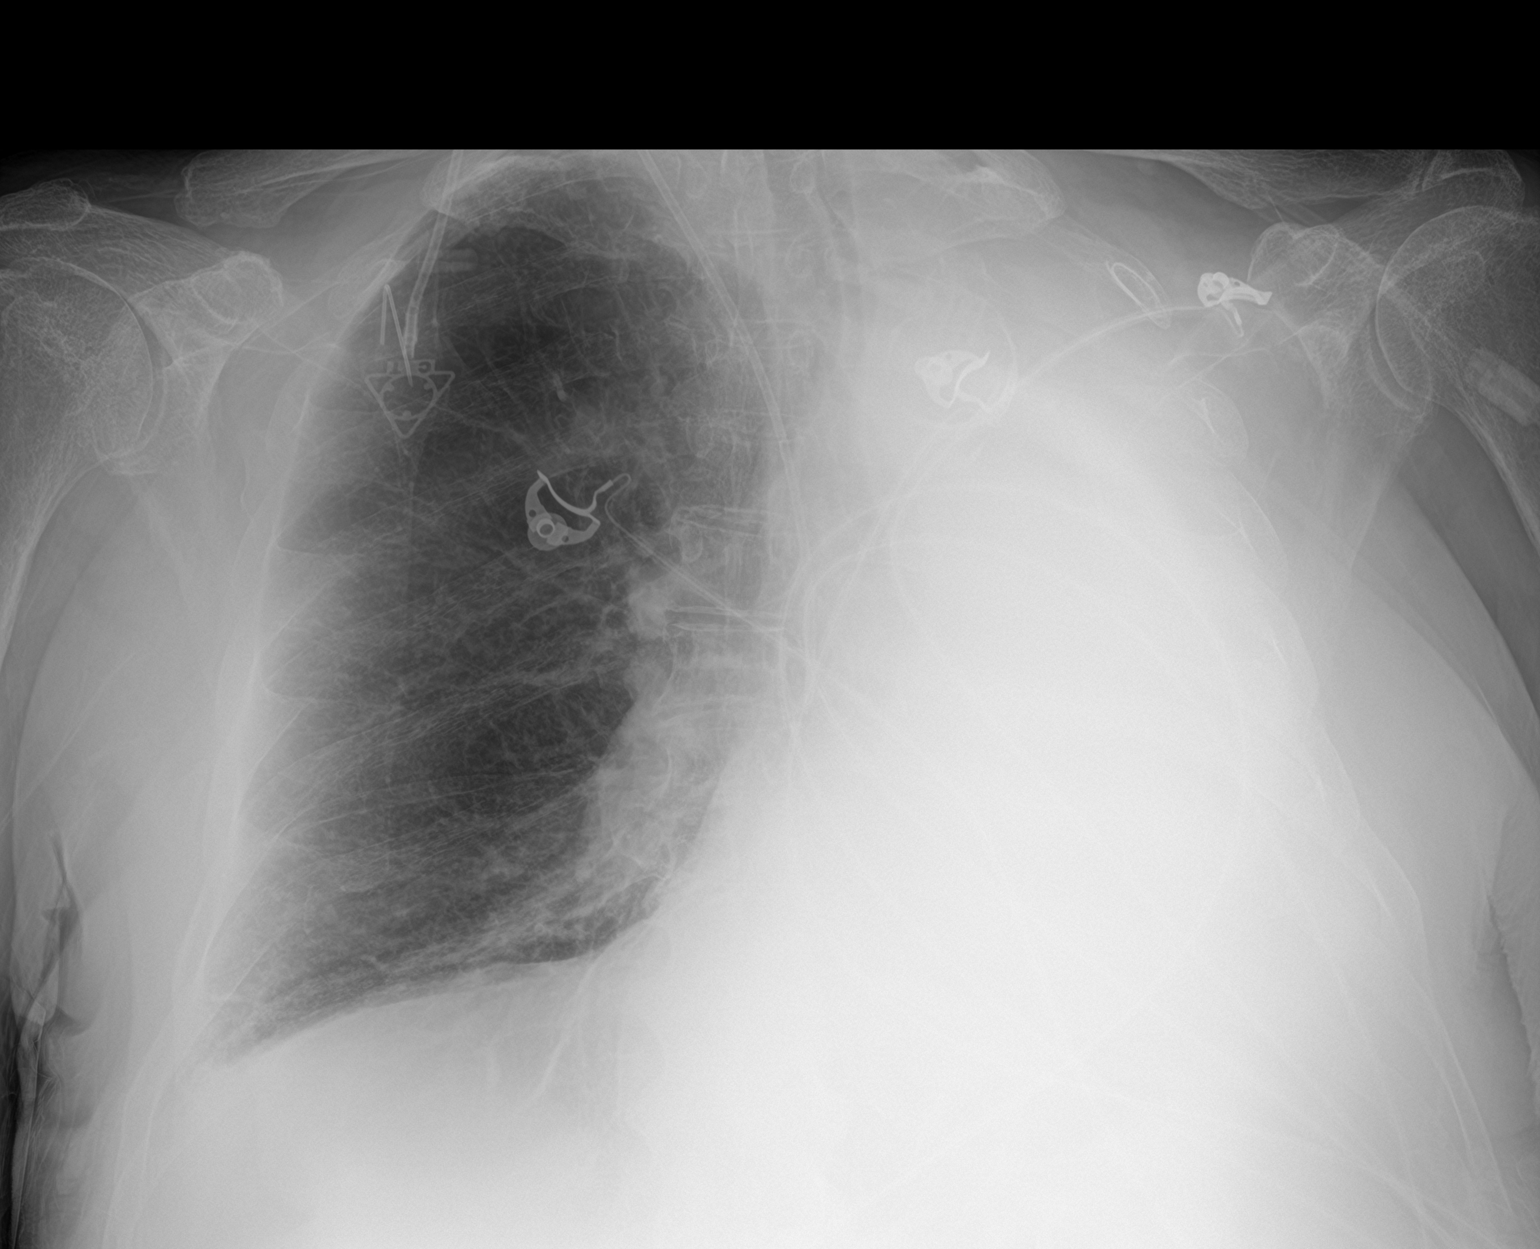

[1 of 1 positions shown; findings below may reference images not displayed]

FINDINGS: Stable complete opacification of left hemithorax most likely due to
combination of atelectasis and effusion. No definite pneumothorax is
noted.
IMPRESSION: Stable complete opacification of left hemithorax most likely due to
combination of atelectasis and effusion. No definite pneumothorax is
noted status post left thoracentesis.

## 2021-04-10 MED ORDER — BOOST / RESOURCE BREEZE PO LIQD CUSTOM
1.0000 | Freq: Three times a day (TID) | ORAL | Status: DC
  Administered 2021-04-10 – 2021-04-14 (×9): 1 via ORAL

## 2021-04-10 MED ORDER — PROSOURCE PLUS PO LIQD
30.0000 mL | Freq: Two times a day (BID) | ORAL | Status: DC
  Administered 2021-04-10 – 2021-04-15 (×8): 30 mL via ORAL
  Filled 2021-04-10 (×8): qty 30

## 2021-04-10 MED ORDER — LIDOCAINE HCL 1 % IJ SOLN
INTRAMUSCULAR | Status: AC
  Administered 2021-04-10: 10 mL
  Filled 2021-04-10: qty 20

## 2021-04-10 MED ORDER — IOHEXOL 300 MG/ML  SOLN
75.0000 mL | Freq: Once | INTRAMUSCULAR | Status: AC | PRN
  Administered 2021-04-10: 75 mL via INTRAVENOUS

## 2021-04-10 MED ORDER — POTASSIUM CHLORIDE 10 MEQ/100ML IV SOLN
10.0000 meq | Freq: Once | INTRAVENOUS | Status: AC
  Administered 2021-04-10: 10 meq via INTRAVENOUS
  Filled 2021-04-10: qty 100

## 2021-04-10 MED ORDER — MAGNESIUM SULFATE 2 GM/50ML IV SOLN
2.0000 g | Freq: Once | INTRAVENOUS | Status: AC
  Administered 2021-04-10: 2 g via INTRAVENOUS
  Filled 2021-04-10: qty 50

## 2021-04-10 MED ORDER — ADULT MULTIVITAMIN W/MINERALS CH
1.0000 | ORAL_TABLET | Freq: Every day | ORAL | Status: DC
  Administered 2021-04-10 – 2021-04-15 (×6): 1 via ORAL
  Filled 2021-04-10 (×6): qty 1

## 2021-04-10 MED ORDER — AMIODARONE LOAD VIA INFUSION
150.0000 mg | Freq: Once | INTRAVENOUS | Status: AC
  Administered 2021-04-10: 150 mg via INTRAVENOUS
  Filled 2021-04-10: qty 83.34

## 2021-04-10 MED ORDER — POTASSIUM CHLORIDE 10 MEQ/100ML IV SOLN
10.0000 meq | INTRAVENOUS | Status: AC
  Administered 2021-04-10 (×3): 10 meq via INTRAVENOUS
  Filled 2021-04-10 (×3): qty 100

## 2021-04-10 NOTE — Progress Notes (Signed)
°   04/09/21 2250  Assess: MEWS Score  Temp 98.1 F (36.7 C)  BP 116/81  Pulse Rate (!) 162  Resp 19  Level of Consciousness Alert  SpO2 98 %  O2 Device Nasal Cannula  O2 Flow Rate (L/min) 4 L/min  Assess: MEWS Score  MEWS Temp 0  MEWS Systolic 0  MEWS Pulse 3  MEWS RR 0  MEWS LOC 0  MEWS Score 3  MEWS Score Color Yellow  Assess: if the MEWS score is Yellow or Red  Were vital signs taken at a resting state? Yes  Focused Assessment No change from prior assessment  Does the patient meet 2 or more of the SIRS criteria? No  Early Detection of Sepsis Score *See Row Information* Low  MEWS guidelines implemented *See Row Information* Yes  Treat  MEWS Interventions Escalated (See documentation below)  Pain Scale 0-10  Pain Score 0  Take Vital Signs  Increase Vital Sign Frequency  Yellow: Q 2hr X 2 then Q 4hr X 2, if remains yellow, continue Q 4hrs  Escalate  MEWS: Escalate Yellow: discuss with charge nurse/RN and consider discussing with provider and RRT  Notify: Charge Nurse/RN  Name of Charge Nurse/RN Notified Christine  Date Charge Nurse/RN Notified 04/09/21  Time Charge Nurse/RN Notified [id number]  Document  Patient Outcome Not stable and remains on department  Progress note created (see row info) Yes

## 2021-04-10 NOTE — Assessment & Plan Note (Addendum)
Home regimen includes albuterol MDI as needed, Spiriva, formoterol neb twice daily, Xopenex neb 3 times daily as needed.  On 4 L nasal cannula at baseline.

## 2021-04-10 NOTE — Assessment & Plan Note (Addendum)
Continue home Pepcid, omeprazole.

## 2021-04-10 NOTE — Progress Notes (Signed)
Initial Nutrition Assessment  DOCUMENTATION CODES:   Severe malnutrition in context of chronic illness  INTERVENTION:  - Boost Breeze po TID, each supplement provides 250 kcal and 9 grams of protein  - Prosource Plus 30 mL po BID, each supplement provides 100 kcal and 15 grams of protein  - MVI daily   - Liberalize diet from heart healthy to regular diet   - Encourage PO and supplement intake    NUTRITION DIAGNOSIS:   Severe Malnutrition related to chronic illness (stage IIIa non-small cell lung cancer) as evidenced by severe muscle depletion, energy intake < 75% for > or equal to 1 month, percent weight loss.   GOAL:   Patient will meet greater than or equal to 90% of their needs   MONITOR:   PO intake, Supplement acceptance, Labs, Weight trends   REASON FOR ASSESSMENT:   Malnutrition Screening Tool    ASSESSMENT:   Pt is a 76 y.o. male with medical history significant for stage IIIa non-small cell lung cancer (recently completed radiation, chemotherapy held due to functional decline), chronic respiratory failure with hypoxia on 4 L O2 via nasal cannula, COPD, Parkinson's disease, T2DM, HTN, HLD, iron deficiency anemia, paroxysmal atrial flutter on Eliquis, depression, who presented to the ED from his facility due to severe tachycardia and admitted for the evaluation of atrial flutter.  02/11/2021 - 02/16/2021: new onset atrial flutter in setting of COVID 03/04/2021: outpatient cardioversion  03/29/2021 - 04/03/2021 - hospitalized at [facility] for generalized weakness/failure to thrive due to cancer and recent chemoradiation therapy, multiple falls due to Parkinson's 04/10/20 - left thoracentesis procedure d/t PE; 2L amber/blood-tinged fluid removed   Met with pt at bedside. Pt resting in bed and in and out of sleep during visit. Pt's wife present during visit. Per pt's wife, pt spent three days at a short-term facility before he was admitted to the hospital. Pt's wife  reports pt's intake was poor while at the facility. Pt's wife reports that pt would only take a couple of bits at each meal. Pt's wife reports pt has had poor appetite for the past month. Per pt, pt continues to endorse metallic taste due to radiation treatment and this has been contributing to poor PO intake. Pt's wife reports that outpatient dietitian saw the pt at the facility he came from and discussed providing the pt with Prosource Plus to aid in caloric and protein intake. Pt denies any pain or nausea. Per pt and pt's wife, no constipation reported. Pt's wife reports that pt's stools have been loose stools, almost diarrhea like. Per pt's wife, pt does well when it comes to drinking oral supplementations, however, does report that pt was refusing the wild berry Boost Breeze when he was admitted at [facility] and refused the supplementation beverage that wife brought in this morning. During visit, pt's wife was looking at the menu and getting ready to order food for the pt.   Discussed tube feeding as a possible nutrition intervention and per pt's wife, stated that pt does not want a tube-feeding.   Discussed the importance of adequate PO intake to maintain energy/strength and for healing. Pt and pt's wife agreeable to nutritional supplements and is agreeable to Boost Breeze (peach-flavored) and Prosource Plus. Pt also agreeable to MVI supplementation. Dietetic intern reached out to MD to liberalize pt's diet and MD agreeable to this plan.   Reviewed weight history. Pt's body weight on 04/09/21 was 85 kg (187.39#). Pt has experienced a 6% weight  loss within the last one month (03/05/21), which is significant for time frame. Per chart notes, pt has lost approximately 50# over the last 3 months. Pt's wife reports that pt was 240# about two years ago and has lost weight since his cancer diagnosis.   Medication reviewed and include: protonix, pepcid, IV heparin 15 ml/hr (1500 units/hr), IV magnesium  sulfate, IV potassium chloride   Labs reviewed and include:  Potassium: 3.4    NUTRITION - FOCUSED PHYSICAL EXAM:  Flowsheet Row Most Recent Value  Orbital Region Moderate depletion  Upper Arm Region Moderate depletion  Thoracic and Lumbar Region Mild depletion  Buccal Region Moderate depletion  Temple Region Moderate depletion  Clavicle Bone Region Mild depletion  Clavicle and Acromion Bone Region Mild depletion  Scapular Bone Region Moderate depletion  Dorsal Hand Mild depletion  Patellar Region Moderate depletion  Anterior Thigh Region Severe depletion  Posterior Calf Region Severe depletion  Edema (RD Assessment) None  Hair Reviewed  Eyes Reviewed  Mouth Reviewed  Skin Reviewed  Nails Reviewed       Diet Order:   Diet Order             Diet Heart Room service appropriate? Yes; Fluid consistency: Thin  Diet effective now                   EDUCATION NEEDS:   Not appropriate for education at this time  Skin:  Skin Assessment: Skin Integrity Issues: Skin Integrity Issues:: Stage II Stage II: coccyx  Last BM:  unknown  Height:   Ht Readings from Last 1 Encounters:  04/09/21 6' 1" (1.854 m)    Weight:   Wt Readings from Last 1 Encounters:  04/09/21 85 kg    Ideal Body Weight:  83.6 kg  BMI:  Body mass index is 24.72 kg/m.  Estimated Nutritional Needs:   Kcal:  2100 - 2300  Protein:  105 - 120 grams  Fluid:  </= 2.1 L    Maryruth Hancock, Dietetic Intern 04/10/2021 4:41 PM

## 2021-04-10 NOTE — Assessment & Plan Note (Addendum)
Continue home Wellbutrin 150 mg p.o. daily, Sertraline 150 mg p.o. daily

## 2021-04-10 NOTE — Progress Notes (Signed)
PROGRESS NOTE    James [name]  QPY:[id number] DOB: Oct 06, 1945 DOA: 04/09/2021 PCP: [name], MD    Brief Narrative:  James [name]  is a 76 y.o. male with past medical history significant for stage IIIa non-small cell lung cancer, recently completed radiation (follows with Dr. Julien Nordmann, chemotherapy held due to functional decline), COPD, chronic respiratory failure with hypoxia (on 4 L supplemental oxygen via nasal cannula at home), paroxysmal atrial flutter on Eliquis, Parkinson's disease, diabetes mellitus type 2, hypertension, hyperlipidemia, iron deficiency anemia, and depression who presented to [facility] ED from [facility] due to severe tachycardia.  Patient reports that he has been having increased shortness of breath.  He had been at the nursing facility for the last month and notes that is also around the time that he stopped receiving chemotherapy treatments.  Over the last 3 months he reports that he is lost approximately 50 pounds. Patient had been on Ceftin since 2/8.   In route with EMS heart rate was noted to be in the 150s.  In the ED patient was noted to be afebrile, heart rates elevated into the 150s in atrial flutter, respirations elevated to 24, blood pressure soft with maps greater than 65, and O2 saturations otherwise maintained on 4 L.  Labs significant for hemoglobin 9.7, potassium 3 albumin 1.8, BNP 177.3.  Chest x-ray noted complete opacification of the left lung possibly representing moderate to large pleural effusion with atelectasis/infiltrate, or possibly postobstructive pneumonitis.  He had initially been given 500 mL of normal saline IV fluid bolus and started on Cardizem drip.  Hospitalist service consulted for further evaluation and management of atrial flutter with RVR.     Assessment & Plan:   Assessment and Plan: * Atrial flutter with rapid ventricular response ([place])- (present on admission) Patient presented after being found to be in atrial flutter  with heart rates into the 150s.  Once in the ED patient was started on a Cardizem drip with some improvement in heart rates.CHA2DS2-VASc score = 5.  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  2022 with LVEF 55-60%, mild concentric LVH, LA/RA moderately dilated, small pericardial effusion without tamponade, mild dilation ascending aorta measuring 40 mm, IVC dilated.  TSH 1.111 on 02/18/2021. --Cardiology following, appreciate assistance --Continue amiodarone drip (on cardizem 300mg  daily outpt) --Heparin drip (Eliquis outpatient) --Continue to monitor on telemetry  Pleural effusion- (present on admission) Patient presented with opacification of the left lung field.  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 --s/p IR left thoracentesis 2/15 with removal 2 L amber/blood-tinged fluid. --Pleural fluid LDH, cell count, protein, albumin, glucose CD19/CD20 flow cytometry, culture: Pending --Pleural fluid cytology: Pending --Repeat CXR with persistent left chest opacification despite 2 L removed, will obtain CT chest for further evaluation with suspicion for worsening malignancy.  Primary cancer of left lower lobe of lung ([place])- (present on admission) Follows with medical oncology outpatient, Dr. Julien Nordmann.  Despite thoracentesis, patient continues with persistent left sided opacification concerning for worsening/progressive malignancy.  Chemotherapy has been on hold due to functional decline per oncology notes. --Partial code with DNI --Palliative care consulted for assistance with goals of care/medical decision making; likely poor prognosis and hospice candidate.  COPD with emphysema ([place])- (present on admission) Home regimen includes albuterol MDI as needed, Spiriva, formoterol neb twice daily, Xopenex neb 3 times daily as needed. --Brovana neb every 12 hours --Incruse Ellipta  1 puff daily --  Continue home oxygen, 4 L per nasal cannula  Chronic respiratory failure with hypoxia ([place])- (present on admission) Patient noted to have O2 saturations maintained on home 4 L of oxygen. --Continue monitor SPO2, maintain SPO2 greater than 90%  Hypokalemia- (present on admission) Potassium 3.4 this morning, will replete. (goal K >/= 4.0 and Mag >/= 2.0) --Repeat electrolytes in a.m.  Hyperlipidemia associated with type 2 diabetes mellitus ([place])- (present on admission) Patient has known history of diabetes, but is well controlled last hemoglobin A1c was 5 on 2/5.  Admission glucose 94.Marland Kitchen  Home medication regimen includes metformin 500 mg twice daily and atorvastatin 20 mg nightly. --Continue atorvastatin 20 mg p.o. daily --Hold metformin while inpatient --Continue monitor glucose levels, if elevated, will start sliding scale insulin  Iron deficiency anemia- (present on admission) Chronic.  Hemoglobin stable and at baseline. --Continue to monitor  Parkinson's disease ([place])- (present on admission) --Continue Sinemet CR 25-100mg  1.5 tablets TID --Donepezil 10 mg p.o. nightly  Protein calorie malnutrition ([place])- (present on admission) Body mass index is 24.72 kg/m. On admission albumin was noted to be low at 1.8. --Dietitian consult  GERD (gastroesophageal reflux disease)- (present on admission) --Pepcid 20 mg p.o. daily --Protonix 40 mg p.o. daily  Depression- (present on admission) --Wellbutrin 150 mg p.o. daily --Sertraline 150 mg p.o. daily       DVT prophylaxis:   Heparin drip   Code Status: Partial Code Family Communication: No family present at bedside this morning; attempted to update patient's spouse, [name] via telephone, went straight to voicemail.  Voicemail message left.  Disposition Plan:  Level of care: Progressive Status is: Inpatient Remains inpatient appropriate because: Pending IR thoracentesis today, remains in a flutter with RVR, on  amiodarone drip.  Awaiting palliative care consultation.  Overall poor prognosis given underlying malignancy and now off chemotherapy given functional decline.    Consultants:  Cardiology Palliative care  Procedures:  IR thoracentesis, left 2/15; 2 L removed  Antimicrobials:  None   Subjective: Patient seen examined at bedside, resting comfortably.  Cardiologist present in the room.  Will receive another IV bolus of amiodarone due to poorly controlled atrial flutter with RVR.  Awaiting IR thoracentesis later today.  Otherwise no other specific complaints or concerns at this time.  No family present at bedside this morning.  Denies headache, no visual changes, no chest pain, no palpitations, no fever/chills/night sweats, no nausea/vomiting/diarrhea, no shortness of breath more than his typical baseline, no abdominal pain, no paresthesias.  No acute events overnight per nursing staff.  Objective: Vitals:   04/10/21 0748 04/10/21 0834 04/10/21 1220 04/10/21 1314  BP: 117/83  127/83   Pulse: (!) 161 (!) 155 (!) 154 (!) 155  Resp: (!) 21 (!) 27 20 (!) 23  Temp: 98 F (36.7 C)     TempSrc: Axillary     SpO2: 98% 94% 96% 96%  Weight:      Height:        Intake/Output Summary (Last 24 hours) at 04/10/2021 1420 Last data filed at 04/10/2021 1400 Gross per 24 hour  Intake 1088.7 ml  Output --  Net 1088.7 ml   Filed Weights   04/09/21 0400  Weight: 85 kg    Examination:  Physical Exam: GEN: NAD, alert and oriented x 3, chronically ill/cachectic in appearance, appears older than stated age HEENT: NCAT, PERRL, EOMI, sclera clear, MMM PULM: Decreased breath sounds from left apex to base, no crackles/wheezing, normal respiratory effort on 4 L nasal cannula which is  his baseline. CV: Irregularly irregular rhythm, tachycardic, w/o M/G/R GI: abd soft, NTND, NABS, no R/G/M MSK: no peripheral edema, muscle strength globally intact 5/5 bilateral upper/lower extremities NEURO: CN II-XII  intact, no focal deficits, sensation to light touch intact PSYCH: normal mood/affect Integumentary: dry/intact, no rashes or wounds    Data Reviewed: I have personally reviewed following labs and imaging studies  CBC: Recent Labs  Lab 04/09/21 0245 04/10/21 0304  WBC 10.3 7.2  HGB 9.7* 9.5*  HCT 32.6* 31.1*  MCV 82.7 82.5  PLT 216 858   Basic Metabolic Panel: Recent Labs  Lab 04/09/21 0245 04/09/21 0850 04/10/21 0304  NA 137  --  138  K 3.0*  --  3.4*  CL 96*  --  97*  CO2 29  --  30  GLUCOSE 94  --  100*  BUN 14  --  11  CREATININE 0.87  --  0.79  CALCIUM 8.8*  --  8.6*  MG  --  1.5* 1.7   GFR: Estimated Creatinine Clearance: 90.2 mL/min (by C-G formula based on SCr of 0.79 mg/dL). Liver Function Tests: Recent Labs  Lab 04/09/21 0245  AST 14*  ALT 9  ALKPHOS 70  BILITOT 0.2*  PROT 5.1*  ALBUMIN 1.8*   No results for input(s): LIPASE, AMYLASE in the last 168 hours. No results for input(s): AMMONIA in the last 168 hours. Coagulation Profile: Recent Labs  Lab 04/09/21 0245  INR 2.6*   Cardiac Enzymes: No results for input(s): CKTOTAL, CKMB, CKMBINDEX, TROPONINI in the last 168 hours. BNP (last 3 results) No results for input(s): PROBNP in the last 8760 hours. HbA1C: No results for input(s): HGBA1C in the last 72 hours. CBG: No results for input(s): GLUCAP in the last 168 hours. Lipid Profile: No results for input(s): CHOL, HDL, LDLCALC, TRIG, CHOLHDL, LDLDIRECT in the last 72 hours. Thyroid Function Tests: No results for input(s): TSH, T4TOTAL, FREET4, T3FREE, THYROIDAB in the last 72 hours. Anemia Panel: No results for input(s): VITAMINB12, FOLATE, FERRITIN, TIBC, IRON, RETICCTPCT in the last 72 hours. Sepsis Labs: Recent Labs  Lab 04/09/21 0850  PROCALCITON 0.10    Recent Results (from the past 240 hour(s))  Resp Panel by RT-PCR (Flu A&B, Covid) Nasopharyngeal Swab     Status: None   Collection Time: 04/02/21  2:48 PM   Specimen:  Nasopharyngeal Swab; Nasopharyngeal(NP) swabs in vial transport medium  Result Value Ref Range Status   SARS Coronavirus 2 by RT PCR NEGATIVE NEGATIVE Final    Comment: (NOTE) SARS-CoV-2 target nucleic acids are NOT DETECTED.  The SARS-CoV-2 RNA is generally detectable in upper respiratory specimens during the acute phase of infection. The lowest concentration of SARS-CoV-2 viral copies this assay can detect is 138 copies/mL. A negative result does not preclude SARS-Cov-2 infection and should not be used as the sole basis for treatment or other patient management decisions. A negative result may occur with  improper specimen collection/handling, submission of specimen other than nasopharyngeal swab, presence of viral mutation(s) within the areas targeted by this assay, and inadequate number of viral copies(<138 copies/mL). A negative result must be combined with clinical observations, patient history, and epidemiological information. The expected result is Negative.  Fact Sheet for Patients:  EntrepreneurPulse.com.au  Fact Sheet for Healthcare Providers:  IncredibleEmployment.be  This test is no t yet approved or cleared by the [place] FDA and  has been authorized for detection and/or diagnosis of SARS-CoV-2 by FDA under an Emergency Use Authorization (EUA). This EUA  will remain  in effect (meaning this test can be used) for the duration of the COVID-19 declaration under Section 564(b)(1) of the Act, 21 U.S.C.section 360bbb-3(b)(1), unless the authorization is terminated  or revoked sooner.       Influenza A by PCR NEGATIVE NEGATIVE Final   Influenza B by PCR NEGATIVE NEGATIVE Final    Comment: (NOTE) The Xpert Xpress SARS-CoV-2/FLU/RSV plus assay is intended as an aid in the diagnosis of influenza from Nasopharyngeal swab specimens and should not be used as a sole basis for treatment. Nasal washings and aspirates are unacceptable for  Xpert Xpress SARS-CoV-2/FLU/RSV testing.  Fact Sheet for Patients: EntrepreneurPulse.com.au  Fact Sheet for Healthcare Providers: IncredibleEmployment.be  This test is not yet approved or cleared by the [place] FDA and has been authorized for detection and/or diagnosis of SARS-CoV-2 by FDA under an Emergency Use Authorization (EUA). This EUA will remain in effect (meaning this test can be used) for the duration of the COVID-19 declaration under Section 564(b)(1) of the Act, 21 U.S.C. section 360bbb-3(b)(1), unless the authorization is terminated or revoked.  Performed at [facility] [address], [place], [place] [address]          Radiology Studies: DG Chest 1 View  Result Date: 04/10/2021 CLINICAL DATA:  Status post left thoracentesis. EXAM: CHEST  1 VIEW COMPARISON:  April 09, 2021. FINDINGS: Stable complete opacification of left hemithorax most likely due to combination of atelectasis and effusion. No definite pneumothorax is noted. IMPRESSION: Stable complete opacification of left hemithorax most likely due to combination of atelectasis and effusion. No definite pneumothorax is noted status post left thoracentesis. Electronically Signed   By: Marijo Conception M.D.   On: 04/10/2021 12:27   DG Chest Port 1 View  Result Date: 04/09/2021 CLINICAL DATA:  Flutter, weakness, tachycardia. EXAM: PORTABLE CHEST 1 VIEW COMPARISON:  03/29/2021. FINDINGS: The heart size and mediastinal contours are obscured. There is atherosclerotic calcification of the aorta. There is complete opacification of the left lung. Mild atelectasis is noted at the right lung base. No pneumothorax. A stable right chest port is noted. No acute osseous abnormality. IMPRESSION: Complete opacification of the left lung, possibly representing a moderate to large pleural effusion with atelectasis or infiltrate. Findings may be associated with  postobstructive pneumonitis related to known left perihilar mass. Electronically Signed   By: Brett Fairy M.D.   On: 04/09/2021 03:03   IR THORACENTESIS ASP PLEURAL SPACE W/IMG GUIDE  Result Date: 04/10/2021 INDICATION: Patient with history of non-small cell lung cancer, COPD, Parkinson's disease, atrial flutter, left pleural effusion. Request received for diagnostic and therapeutic left thoracentesis. EXAM: ULTRASOUND GUIDED DIAGNOSTIC AND THERAPEUTIC LEFT THORACENTESIS MEDICATIONS: 10 ml 1% lidocaine COMPLICATIONS: None immediate. PROCEDURE: An ultrasound guided thoracentesis was thoroughly discussed with the patient and questions answered. The benefits, risks, alternatives and complications were also discussed. The patient understands and wishes to proceed with the procedure. Written consent was obtained. Ultrasound was performed to localize and mark an adequate pocket of fluid in the left chest. The area was then prepped and draped in the normal sterile fashion. 1% Lidocaine was used for local anesthesia. Under ultrasound guidance a 6 Fr Safe-T-Centesis catheter was introduced. Thoracentesis was performed. The catheter was removed and a dressing applied. FINDINGS: A total of approximately 2 liters of amber/blood-tinged fluid was removed. Samples were sent to the laboratory as requested by the clinical team. IMPRESSION: Successful ultrasound guided diagnostic and therapeutic left thoracentesis yielding 2 liters of  pleural fluid. Read by: Rowe Robert, PA-C Electronically Signed   By: Corrie Mckusick D.O.   On: 04/10/2021 13:25        Scheduled Meds:  acidophilus  1 capsule Oral QODAY   arformoterol  15 mcg Nebulization Q12H   atorvastatin  20 mg Oral QHS   buPROPion  150 mg Oral Daily   Carbidopa-Levodopa ER  1.5 tablet Oral TID   Chlorhexidine Gluconate Cloth  6 each Topical Daily   donepezil  10 mg Oral QHS   famotidine  20 mg Oral Daily   levalbuterol  0.63 mg Nebulization TID   lidocaine   2 patch Transdermal Daily   pantoprazole  40 mg Oral QPM   sertraline  150 mg Oral Daily   sodium chloride flush  10-40 mL Intracatheter Q12H   sodium chloride flush  3 mL Intravenous Q12H   umeclidinium bromide  1 puff Inhalation Daily   Continuous Infusions:  sodium chloride 20 mL/hr at 04/10/21 0448   amiodarone 30 mg/hr (04/10/21 0909)   heparin 1,500 Units/hr (04/10/21 0743)   magnesium sulfate bolus IVPB     potassium chloride 10 mEq (04/10/21 1339)     LOS: 1 day    Time spent: 49 minutes spent on chart review, discussion with nursing staff, consultants, updating family and interview/physical exam; more than 50% of that time was spent in counseling and/or coordination of care.    Bich Mchaney J [place] DO [facility] Available via Epic secure chat 7am-7pm After these hours, please refer to coverage provider listed on [email or web address] 04/10/2021, 2:20 PM

## 2021-04-10 NOTE — Procedures (Addendum)
Ultrasound-guided diagnostic and therapeutic left thoracentesis performed yielding 2 liters of amber/blood-tinged fluid. No immediate complications. Follow-up chest x-ray pending. A portion of the fluid was sent to the lab for preordered studies. EBL < 1 cc. Consider f/u CT chest if opacification persists post thora.

## 2021-04-10 NOTE — Assessment & Plan Note (Addendum)
Repleted during hospitalization.

## 2021-04-10 NOTE — Progress Notes (Signed)
ANTICOAGULATION CONSULT NOTE   Pharmacy Consult for heparin Indication: atrial fibrillation  Allergies  Allergen Reactions   Aspirin Other (See Comments)    GI bleed   Codeine Itching   Morphine Itching and Nausea Only    Patient Measurements: Height: 6\' 1"  (185.4 cm) Weight: 85 kg (187 lb 6.3 oz) IBW/kg (Calculated) : 79.9 Heparin Dosing Weight: 85 kg  Vital Signs: Temp: 97.8 F (36.6 C) (02/15 0200) Temp Source: Oral (02/15 0200) BP: 119/81 (02/15 0200) Pulse Rate: 133 (02/15 0200)  Labs: Recent Labs    04/09/21 0245 04/09/21 2028 04/10/21 0304  HGB 9.7*  --  9.5*  HCT 32.6*  --  31.1*  PLT 216  --  172  APTT  --  54* 68*  LABPROT 27.4*  --   --   INR 2.6*  --   --   HEPARINUNFRC  --  0.46 0.36  CREATININE 0.87  --  0.79     Estimated Creatinine Clearance: 90.2 mL/min (by C-G formula based on SCr of 0.79 mg/dL).   Medical History: Past Medical History:  Diagnosis Date   Allergy    seasonal   Blind left eye    legally   Blood transfusion    2002   COPD (chronic obstructive pulmonary disease) (HCC)    Depression    Diabetes mellitus, type 2 (HCC)    Diverticulosis    Dyspnea    ED (erectile dysfunction)    Glaucoma    Hyperlipidemia    Hypertension    Insomnia    Internal and external bleeding hemorrhoids    Iron deficiency anemia    Lung cancer ([place])    Osteoarthritis    Parkinson's disease ([place]) 2014   Dr. [name] PCP  and Dr. [name] at [facility]   Paroxysmal atrial flutter [facility])    Rosacea    Tremor of both hands    Tubular adenoma of colon 03/28/2011    Assessment: 61 YOM presenting with weakness and tachycardia, hx of afib on Eliquis PTA with last dose 2/13 @2330 , chronic anemia stable, plts 216.  aPTT came back therapeutic at 68, heparin level also therapeutic at 0.36, on 1500 units/hr - levels correlating so can monitor heparin levels. Hgb 9.5, plt 172. No s/sx of bleeding or infusion issues.  Goal of Therapy:  Heparin level 0.3-0.7  units/ml aPTT 66-102 seconds Monitor platelets by anticoagulation protocol: Yes   Plan:  Continue heparin gtt at 1500 units/hr Monitor CBC, aPTT/HL, and s/sx of bleeding daily  Antonietta Jewel, PharmD, [place] [facility]  Phone: [phone] 04/10/2021 7:35 AM  Please check AMION for all [place] phone numbers After 10:00 PM, call [place] [phone]

## 2021-04-10 NOTE — Hospital Course (Signed)
James [name]  is a 76 y.o. male with past medical history significant for stage IIIa non-small cell lung cancer, recently completed radiation (follows with Dr. Julien Nordmann, chemotherapy held due to functional decline), COPD, chronic respiratory failure with hypoxia (on 4 L supplemental oxygen via nasal cannula at home), paroxysmal atrial flutter on Eliquis, Parkinson's disease, diabetes mellitus type 2, hypertension, hyperlipidemia, iron deficiency anemia, and depression who presented to [facility] ED from [facility] due to severe tachycardia.  Patient reports that he has been having increased shortness of breath.  He had been at the nursing facility for the last month and notes that is also around the time that he stopped receiving chemotherapy treatments.  Over the last 3 months he reports that he is lost approximately 50 pounds. Patient had been on Ceftin since 2/8.   In route with EMS heart rate was noted to be in the 150s.  In the ED patient was noted to be afebrile, heart rates elevated into the 150s in atrial flutter, respirations elevated to 24, blood pressure soft with maps greater than 65, and O2 saturations otherwise maintained on 4 L.  Labs significant for hemoglobin 9.7, potassium 3 albumin 1.8, BNP 177.3.  Chest x-ray noted complete opacification of the left lung possibly representing moderate to large pleural effusion with atelectasis/infiltrate, or possibly postobstructive pneumonitis.  He had initially been given 500 mL of normal saline IV fluid bolus and started on Cardizem drip.  Hospitalist service consulted for further evaluation and management of atrial flutter with RVR.

## 2021-04-10 NOTE — Progress Notes (Signed)
Progress Note  Patient Name: James [name] Date of Encounter: 04/10/2021  Primary Cardiologist:   [name], MD   Subjective   Confused but awake and answering questions.  He denies pain but reports SOB.   Inpatient Medications    Scheduled Meds:  acidophilus  1 capsule Oral QODAY   arformoterol  15 mcg Nebulization Q12H   atorvastatin  20 mg Oral QHS   buPROPion  150 mg Oral Daily   Carbidopa-Levodopa ER  1.5 tablet Oral TID   Chlorhexidine Gluconate Cloth  6 each Topical Daily   donepezil  10 mg Oral QHS   famotidine  20 mg Oral Daily   levalbuterol  0.63 mg Nebulization TID   lidocaine  2 patch Transdermal Daily   pantoprazole  40 mg Oral QPM   sertraline  150 mg Oral Daily   sodium chloride flush  10-40 mL Intracatheter Q12H   sodium chloride flush  3 mL Intravenous Q12H   umeclidinium bromide  1 puff Inhalation Daily   Continuous Infusions:  sodium chloride 20 mL/hr at 04/10/21 0448   amiodarone 30 mg/hr (04/10/21 0448)   heparin 1,500 Units/hr (04/10/21 0743)   potassium chloride 10 mEq (04/10/21 0747)   PRN Meds: acetaminophen **OR** acetaminophen, lidocaine-prilocaine, sodium chloride flush   Vital Signs    Vitals:   04/10/21 0017 04/10/21 0200 04/10/21 0737 04/10/21 0748  BP: 119/75 119/81  117/83  Pulse: (!) 160 (!) 133 (!) 140 (!) 161  Resp: 19 20 (!) 22 (!) 21  Temp: 99.1 F (37.3 C) 97.8 F (36.6 C)  98 F (36.7 C)  TempSrc: Oral Oral  Axillary  SpO2: 94%  94% 98%  Weight:      Height:        Intake/Output Summary (Last 24 hours) at 04/10/2021 0827 Last data filed at 04/10/2021 0448 Gross per 24 hour  Intake 761.31 ml  Output --  Net 761.31 ml   Filed Weights   04/09/21 0400  Weight: 85 kg    Telemetry    Atrial flutter with variable rate.  - Personally Reviewed  ECG    NA - Personally Reviewed  Physical Exam   GEN: No acute distress.   Neck: No JVD Cardiac: Irregular RR, no, distant heart sounds Respiratory:     Absent breath sounds on the left.  GI: Soft, nontender, non-distended  MS: No edema; No deformity. Neuro:  Nonfocal  Psych:      Oriented to person and situation.    Labs    Chemistry Recent Labs  Lab 04/09/21 0245 04/10/21 0304  NA 137 138  K 3.0* 3.4*  CL 96* 97*  CO2 29 30  GLUCOSE 94 100*  BUN 14 11  CREATININE 0.87 0.79  CALCIUM 8.8* 8.6*  PROT 5.1*  --   ALBUMIN 1.8*  --   AST 14*  --   ALT 9  --   ALKPHOS 70  --   BILITOT 0.2*  --   GFRNONAA >60 >60  ANIONGAP 12 11     Hematology Recent Labs  Lab 04/09/21 0245 04/10/21 0304  WBC 10.3 7.2  RBC 3.94* 3.77*  HGB 9.7* 9.5*  HCT 32.6* 31.1*  MCV 82.7 82.5  MCH 24.6* 25.2*  MCHC 29.8* 30.5  RDW 28.8* 28.7*  PLT 216 172    Cardiac EnzymesNo results for input(s): TROPONINI in the last 168 hours. No results for input(s): TROPIPOC in the last 168 hours.   BNP Recent Labs  Lab 04/09/21 0246  BNP 177.3*     DDimer No results for input(s): DDIMER in the last 168 hours.   Radiology    DG Chest Port 1 View  Result Date: 04/09/2021 CLINICAL DATA:  Flutter, weakness, tachycardia. EXAM: PORTABLE CHEST 1 VIEW COMPARISON:  03/29/2021. FINDINGS: The heart size and mediastinal contours are obscured. There is atherosclerotic calcification of the aorta. There is complete opacification of the left lung. Mild atelectasis is noted at the right lung base. No pneumothorax. A stable right chest port is noted. No acute osseous abnormality. IMPRESSION: Complete opacification of the left lung, possibly representing a moderate to large pleural effusion with atelectasis or infiltrate. Findings may be associated with postobstructive pneumonitis related to known left perihilar mass. Electronically Signed   By: Brett Fairy M.D.   On: 04/09/2021 03:03    Cardiac Studies   ECHO  1. Left ventricular ejection fraction, by estimation, is 55 to 60%. The  left ventricle has normal function. The left ventricle has no regional  wall  motion abnormalities. There is mild concentric left ventricular  hypertrophy. Left ventricular diastolic  function could not be evaluated.   2. Right ventricular systolic function is normal. The right ventricular  size is normal. There is normal pulmonary artery systolic pressure.   3. Left atrial size was mild to moderately dilated.   4. Right atrial size was mild to moderately dilated.   5. A small pericardial effusion is present. There is no evidence of  cardiac tamponade.   6. The mitral valve is normal in structure. No evidence of mitral valve  regurgitation. No evidence of mitral stenosis.   7. The aortic valve is grossly normal. Aortic valve regurgitation is not  visualized. No aortic stenosis is present.   8. There is mild dilatation of the ascending aorta, measuring 40 mm.   9. The inferior vena cava is dilated in size with >50% respiratory  variability, suggesting right atrial pressure of 8 mmHg.    Patient Profile     76 y.o. 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 Tamala Julian.    Assessment & Plan    ATRIAL FLUTTER WITH RAPID RATE:  Rate is intermittently slower.  I will continue IV amiodarone and give another bolus today.  Hopefully he will convert and I might do PO amio as a comfort measure post hospitalization.    LEFT PLEURAL EFFUSION:  He is going to have thoracentesis today.     For questions or updates, please contact [place] Please consult [email or web address] for contact info under Cardiology/STEMI.   Signed, Minus Breeding, MD  04/10/2021, 8:27 AM

## 2021-04-10 NOTE — Progress Notes (Signed)
CSW spoke with Star from [facility]. Patient comes from [facility] short term. CSW requested PT/OT orders from MD.CSW will continue to follow.

## 2021-04-11 ENCOUNTER — Ambulatory Visit: Payer: Medicare Other | Admitting: Neurology

## 2021-04-11 DIAGNOSIS — I4892 Unspecified atrial flutter: Secondary | ICD-10-CM | POA: Diagnosis not present

## 2021-04-11 LAB — HEPARIN LEVEL (UNFRACTIONATED)
Heparin Unfractionated: <0.1 IU/mL — ABNORMAL LOW (ref 0.30–0.70)
Heparin Unfractionated: 0.11 IU/mL — ABNORMAL LOW (ref 0.30–0.70)
Heparin Unfractionated: 0.22 IU/mL — ABNORMAL LOW (ref 0.30–0.70)

## 2021-04-11 LAB — CBC
HCT: 30.8 % — ABNORMAL LOW (ref 39.0–52.0)
Hemoglobin: 9.3 g/dL — ABNORMAL LOW (ref 13.0–17.0)
MCH: 24.9 pg — ABNORMAL LOW (ref 26.0–34.0)
MCHC: 30.2 g/dL (ref 30.0–36.0)
MCV: 82.4 fL (ref 80.0–100.0)
Platelets: 183 10*3/uL (ref 150–400)
RBC: 3.74 MIL/uL — ABNORMAL LOW (ref 4.22–5.81)
RDW: 28.9 % — ABNORMAL HIGH (ref 11.5–15.5)
WBC: 5.2 10*3/uL (ref 4.0–10.5)
nRBC: 0 % (ref 0.0–0.2)

## 2021-04-11 LAB — BASIC METABOLIC PANEL
Anion gap: 9 (ref 5–15)
BUN: 7 mg/dL — ABNORMAL LOW (ref 8–23)
CO2: 32 mmol/L (ref 22–32)
Calcium: 8.1 mg/dL — ABNORMAL LOW (ref 8.9–10.3)
Chloride: 96 mmol/L — ABNORMAL LOW (ref 98–111)
Creatinine, Ser: 0.6 mg/dL — ABNORMAL LOW (ref 0.61–1.24)
GFR, Estimated: >60 mL/min (ref >60)
Glucose, Bld: 130 mg/dL — ABNORMAL HIGH (ref 70–99)
Potassium: 3.2 mmol/L — ABNORMAL LOW (ref 3.5–5.1)
Sodium: 137 mmol/L (ref 135–145)

## 2021-04-11 LAB — LACTATE DEHYDROGENASE: LDH: 111 U/L (ref 98–192)

## 2021-04-11 LAB — CYTOLOGY - NON PAP

## 2021-04-11 LAB — MAGNESIUM: Magnesium: 1.7 mg/dL (ref 1.7–2.4)

## 2021-04-11 LAB — APTT: aPTT: 73 seconds — ABNORMAL HIGH (ref 24–36)

## 2021-04-11 MED ORDER — POTASSIUM CHLORIDE CRYS ER 20 MEQ PO TBCR
40.0000 meq | EXTENDED_RELEASE_TABLET | ORAL | Status: AC
  Administered 2021-04-11 (×2): 40 meq via ORAL
  Filled 2021-04-11 (×2): qty 2

## 2021-04-11 MED ORDER — MAGNESIUM SULFATE 2 GM/50ML IV SOLN
2.0000 g | Freq: Once | INTRAVENOUS | Status: AC
  Administered 2021-04-11: 2 g via INTRAVENOUS
  Filled 2021-04-11: qty 50

## 2021-04-11 MED ORDER — METOPROLOL TARTRATE 25 MG PO TABS
25.0000 mg | ORAL_TABLET | Freq: Two times a day (BID) | ORAL | Status: DC
  Administered 2021-04-11 – 2021-04-12 (×3): 25 mg via ORAL
  Filled 2021-04-11 (×3): qty 1

## 2021-04-11 MED ORDER — LEVALBUTEROL HCL 0.63 MG/3ML IN NEBU
0.6300 mg | INHALATION_SOLUTION | Freq: Two times a day (BID) | RESPIRATORY_TRACT | Status: DC
  Administered 2021-04-11 – 2021-04-15 (×7): 0.63 mg via RESPIRATORY_TRACT
  Filled 2021-04-11 (×8): qty 3

## 2021-04-11 NOTE — Consult Note (Signed)
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  He now has a complication of atrial fib with rapid ventricular response generalized failure to thrive.  Pulmonary critical care is asked to attempt to fiberoptic bronchoscopy as performed on December 2022 by Dr. Valeta Harms to reopen left lower lung segment but he appears too weak to undergo invasive procedure at this time.  He is followed by Dr. Earlie Server of oncology and has completed radiation and chemotherapy.  Palliative care is evaluating him he is a DO NOT INTUBATE and he appears ready for comfort care at this time.  Pertinent  Medical History   Past Medical History:  Diagnosis Date   Allergy    seasonal   Blind left eye    legally   Blood transfusion    2002   COPD (chronic obstructive pulmonary disease) (HCC)    Depression    Diabetes mellitus, type 2 (HCC)    Diverticulosis    Dyspnea    ED (erectile dysfunction)    Glaucoma    Hyperlipidemia    Hypertension    Insomnia    Internal and external bleeding hemorrhoids    Iron deficiency anemia    Lung cancer ([place])    Osteoarthritis    Parkinson's disease ([place]) 2014   Dr. [name] PCP  and Dr. [name] at [facility]   Paroxysmal atrial flutter [facility])    Rosacea    Tremor of both hands    Tubular adenoma of colon 03/28/2011     Significant Hospital Events: Including procedures, antibiotic start and stop dates in addition to other pertinent events   Status post left thoracentesis with 2000 cc fluid obtained per IR  Interim History / Subjective:  Worsening respiratory and cardiac function with atrial  fibrillation and O2 dependent respiratory failure in setting of squamous cell carcinoma of the lung  Objective   Blood pressure 112/84, pulse (!) 142, temperature 98.3 F (36.8 C), temperature source Oral, resp. rate 18, height 6\' 1"  (1.854 m), weight 85 kg, SpO2 99 %.    FiO2 (%):  [40 %] 40 %   Intake/Output Summary (Last 24 hours) at 04/11/2021 1013 Last data filed at 04/11/2021 5284 Gross per 24 hour  Intake 1961.82 ml  Output 700 ml  Net 1261.82 ml   Filed Weights   04/09/21 0400  Weight: 85 kg    Examination: General: Frail elderly male HENT: No JVD or lymphadenopathy  Lungs: Decreased breath sounds throughout left greater than right Cardiovascular: Heart sounds irregular noted to be in atrial fibrillation Abdomen: Appears malnourished positive bowel sounds Extremities: Without edema Neuro: Dull effect but without obvious deficit GU: Deferred  Resolved Hospital Problem list     Assessment & Plan:  Recurrent opacification left lung, diagnosed with squamous cell carcinoma 02/04/2021, status post fiberoptic bronchoscopy December 22 per Dr. Jamey Ripa with opening of left lower lobe obstruction.  Status post thoracentesis with 2000 cc of fluid obtained per interventional radiology.  Pulmonary critical care asked to reevaluate for possible repeat fiberoptic bronchoscopy for opacification of the left lung. Review CT scan Questionable if current health will allow interventional  fiberoptic bronchoscopy Status post chemo and radiation therapy  per oncology with Dr. Earlie Server CODE STATUS is now DO NOT INTUBATE MD to evaluate He appears very tired and is not able to undergo any invasive procedures at this time note palliative care is following.  History of COPD and emphysema Bronchodilators Oxygen as needed currently on 5 L with sats of 95% Brovana and Xopenex  New atrial fibrillation rapid ventricular response Per cardiology  Generalized failure to thrive in the setting of  metastatic lung cancer Now a limited code  Best Practice (right click and "Reselect all SmartList Selections" daily)   Diet/type: Regular consistency (see orders) DVT prophylaxis: systemic heparin GI prophylaxis: PPI Lines: N/A Foley:  N/A Code Status:  limited no intubation Last date of multidisciplinary goals of care discussion [tbd]  Labs   CBC: Recent Labs  Lab 04/09/21 0245 04/10/21 0304 04/11/21 0634  WBC 10.3 7.2 5.2  HGB 9.7* 9.5* 9.3*  HCT 32.6* 31.1* 30.8*  MCV 82.7 82.5 82.4  PLT 216 172 408    Basic Metabolic Panel: Recent Labs  Lab 04/09/21 0245 04/09/21 0850 04/10/21 0304 04/11/21 0426  NA 137  --  138 137  K 3.0*  --  3.4* 3.2*  CL 96*  --  97* 96*  CO2 29  --  30 32  GLUCOSE 94  --  100* 130*  BUN 14  --  11 7*  CREATININE 0.87  --  0.79 0.60*  CALCIUM 8.8*  --  8.6* 8.1*  MG  --  1.5* 1.7 1.7   GFR: Estimated Creatinine Clearance: 90.2 mL/min (A) (by C-G formula based on SCr of 0.6 mg/dL (L)). Recent Labs  Lab 04/09/21 0245 04/09/21 0850 04/10/21 0304 04/11/21 0634  PROCALCITON  --  0.10  --   --   WBC 10.3  --  7.2 5.2    Liver Function Tests: Recent Labs  Lab 04/09/21 0245  AST 14*  ALT 9  ALKPHOS 70  BILITOT 0.2*  PROT 5.1*  ALBUMIN 1.8*   No results for input(s): LIPASE, AMYLASE in the last 168 hours. No results for input(s): AMMONIA in the last 168 hours.  ABG    Component Value Date/Time   PHART 7.403 01/31/2008 1508   PCO2ART 42.4 01/31/2008 1508   PO2ART 70.8 (L) 01/31/2008 1508   HCO3 28.5 (H) 03/29/2021 1753   TCO2 28 03/29/2021 1805   O2SAT 26.6 03/29/2021 1753     Coagulation Profile: Recent Labs  Lab 04/09/21 0245  INR 2.6*    Cardiac Enzymes: No results for input(s): CKTOTAL, CKMB, CKMBINDEX, TROPONINI in the last 168 hours.  HbA1C: Hgb A1c MFr Bld  Date/Time Value Ref Range Status  03/31/2021 08:02 AM 5.0 4.8 - 5.6 % Final    Comment:    (NOTE) Pre diabetes:          5.7%-6.4%  Diabetes:               >6.4%  Glycemic control for   <7.0% adults with diabetes   02/12/2021 04:16 AM 5.8 (H) 4.8 - 5.6 % Final    Comment:    (NOTE) Pre diabetes:          5.7%-6.4%  Diabetes:              >6.4%  Glycemic control for   <7.0% adults with diabetes     CBG: No results for input(s): GLUCAP in the last 168 hours.  Review of Systems:   10 point review of system taken, please see HPI for positives  and negatives. Positive for increased weakness over the last 30 days Noted to be failure to thrive New atrial fibrillation ventricular response  Past Medical History:  He,  has a past medical history of Allergy, Blind left eye, Blood transfusion, COPD (chronic obstructive pulmonary disease) ([place]), Depression, Diabetes mellitus, type 2 ([place]), Diverticulosis, Dyspnea, ED (erectile dysfunction), Glaucoma, Hyperlipidemia, Hypertension, Insomnia, Internal and external bleeding hemorrhoids, Iron deficiency anemia, Lung cancer ([place]), Osteoarthritis, Parkinson's disease ([place]) (2014), Paroxysmal atrial flutter ([place]), Rosacea, Tremor of both hands, and Tubular adenoma of colon (03/28/2011).   Surgical History:   Past Surgical History:  Procedure Laterality Date   ARTHROSCOPIC REPAIR ACL     BALLOON DILATION  01/28/2021   Procedure: BALLOON DILATION;  Surgeon: [name], DO;  Location: [place];  Service: Pulmonary;;   BRONCHIAL BIOPSY  01/28/2021   Procedure: BRONCHIAL BIOPSIES;  Surgeon: [name], DO;  Location: [place];  Service: Pulmonary;;   CARDIOVERSION N/A 03/04/2021   Procedure: CARDIOVERSION;  Surgeon: [name], MD;  Location: [place];  Service: Cardiovascular;  Laterality: N/A;   [place]   right   COLONOSCOPY WITH PROPOFOL N/A 07/25/2015   Procedure: COLONOSCOPY WITH PROPOFOL;  Surgeon: [name], MD;  Location: [facility];  Service: Endoscopy;  Laterality: N/A;   CRYOTHERAPY  01/28/2021    Procedure: CRYOTHERAPY;  Surgeon: [name], DO;  Location: [place] [facility];  Service: Pulmonary;;   ESOPHAGOGASTRODUODENOSCOPY (EGD) WITH PROPOFOL N/A 07/25/2015   Procedure: ESOPHAGOGASTRODUODENOSCOPY (EGD) WITH PROPOFOL;  Surgeon: [name], MD;  Location: [facility];  Service: Endoscopy;  Laterality: N/A;   HEMOSTASIS CONTROL  01/28/2021   Procedure: HEMOSTASIS CONTROL;  Surgeon: [name], DO;  Location: [place];  Service: Pulmonary;;   INTRAVASCULAR PRESSURE WIRE/FFR STUDY N/A 01/22/2021   Procedure: INTRAVASCULAR PRESSURE WIRE/FFR STUDY;  Surgeon: [name], Peter M, MD;  Location: [place] [facility];  Service: Cardiovascular;  Laterality: N/A;   IR IMAGING GUIDED PORT INSERTION  03/18/2021   IR THORACENTESIS ASP PLEURAL SPACE W/IMG GUIDE  09/17/2020   IR THORACENTESIS ASP PLEURAL SPACE W/IMG GUIDE  10/25/2020   IR THORACENTESIS ASP PLEURAL SPACE W/IMG GUIDE  04/10/2021   KNEE ARTHROSCOPY  1996   LEFT HEART CATH AND CORONARY ANGIOGRAPHY N/A 01/22/2021   Procedure: LEFT HEART CATH AND CORONARY ANGIOGRAPHY;  Surgeon: [name], Peter M, MD;  Location: [place] [facility];  Service: Cardiovascular;  Laterality: N/A;   PARTIAL COLECTOMY  2008   TEE WITHOUT CARDIOVERSION N/A 03/04/2021   Procedure: TRANSESOPHAGEAL ECHOCARDIOGRAM (TEE);  Surgeon: [name], MD;  Location[facility] ENDOSCOPY;  Service: Cardiovascular;  Laterality: N/A;   THYROID CYST EXCISION     TONSILLECTOMY     VIDEO BRONCHOSCOPY Left 01/28/2021   Procedure: VIDEO BRONCHOSCOPY WITHOUT FLUORO;  Surgeon: [name], DO;  Location: [place];  Service: Pulmonary;  Laterality: Left;     Social History:   reports that he quit smoking about 5 years ago. His smoking use included cigarettes. He has a 75.00 pack-year smoking history. He has never used smokeless tobacco. He reports that he does not currently use alcohol. He reports that he does not use drugs.   Family History:  His family history includes  Alcohol abuse in his father; Asthma in his mother; CAD in his father; COPD in his mother; Cancer in his maternal grandmother; Emphysema in his mother; Heart disease in his father. There is no history  of Colon cancer.   Allergies Allergies  Allergen Reactions   Aspirin Other (See Comments)    GI bleed   Codeine Itching   Morphine Itching and Nausea Only     Home Medications  Prior to Admission medications   Medication Sig Start Date End Date Taking? Authorizing Provider  acetaminophen (TYLENOL) 325 MG tablet Take 2 tablets (650 mg total) by mouth every 6 (six) hours as needed for mild pain (or Fever >/= 101). Patient taking differently: Take 650 mg by mouth every 12 (twelve) hours as needed for mild pain (or Fever >/= 101). 04/03/21  Yes Phillips Grout, MD  albuterol (VENTOLIN HFA) 108 (90 Base) MCG/ACT inhaler Inhale 2 puffs into the lungs every 6 (six) hours as needed for wheezing or shortness of breath.   Yes [provider]  Amino Acids-Protein Hydrolys (PRO-STAT) LIQD Take 30 mLs by mouth 2 (two) times daily.   Yes [provider]  apixaban (ELIQUIS) 5 MG TABS tablet Take 1 tablet (5 mg total) by mouth 2 (two) times daily. 02/16/21  Yes Dwyane Dee, MD  atorvastatin (LIPITOR) 20 MG tablet Take 20 mg by mouth at bedtime.  05/29/12  Yes [provider]  buPROPion (WELLBUTRIN XL) 150 MG 24 hr tablet Take 150 mg by mouth daily.   Yes [provider]  calcium carbonate (TUMS - DOSED IN MG ELEMENTAL CALCIUM) 500 MG chewable tablet Chew 1 tablet by mouth daily.   Yes [provider]  Carbidopa-Levodopa ER (SINEMET CR) 25-100 MG tablet controlled release Take 1.5 tablets by mouth in the morning, at noon, and at bedtime. Take at 0800, 1400 & 2200 03/17/21  Yes [provider]  cefUROXime (CEFTIN) 250 MG tablet Take 250 mg by mouth 2 (two) times daily with a meal.   Yes [provider]  cetirizine (ZYRTEC) 10 MG tablet Take 10 mg by  mouth daily.   Yes [provider]  Cholecalciferol (VITAMIN D-3) 25 MCG (1000 UT) CAPS Take 1 capsule by mouth daily.   Yes [provider]  diltiazem (CARDIZEM CD) 300 MG 24 hr capsule Take 1 capsule (300 mg total) by mouth daily. 02/17/21  Yes Dwyane Dee, MD  donepezil (ARICEPT) 10 MG tablet Take 1 tablet (10 mg total) by mouth at bedtime. 05/08/20  Yes Star Age, MD  famotidine (PEPCID) 20 MG tablet Take 20 mg by mouth daily.   Yes [provider]  ferrous gluconate (FERGON) 324 MG tablet TAKE 1 TABLET BY MOUTH DAILY WITH BREAKFAST Patient taking differently: Take 324 mg by mouth daily with breakfast. 09/26/19  Yes Nicholas Lose, MD  formoterol (PERFOROMIST) 20 MCG/2ML nebulizer solution Take 2 mLs (20 mcg total) by nebulization 2 (two) times daily. 10/01/20  Yes Chesley Mires, MD  hydrOXYzine (ATARAX) 10 MG tablet Take 5 mg by mouth 2 (two) times daily.   Yes [provider]  ibuprofen (ADVIL) 800 MG tablet Take 1 tablet (800 mg total) by mouth every 8 (eight) hours as needed for moderate pain. 12/31/20  Yes Chesley Mires, MD  levalbuterol Penne Lash) 0.63 MG/3ML nebulizer solution Take 0.63 mg by nebulization in the morning, at noon, and at bedtime.   Yes [provider]  Lidocaine 4 % PTCH Apply 2 patches topically daily.   Yes [provider]  lidocaine-prilocaine (EMLA) cream Apply 1 application topically as needed. Patient taking differently: Apply 1 application topically as needed (access port). 02/26/21  Yes Heilingoetter, Cassandra L, PA-C  metFORMIN (GLUCOPHAGE) 500 MG  tablet Take 1 tablet (500 mg total) by mouth 2 (two) times daily with a meal. 01/25/21  Yes [name], Peter M, MD  metoprolol tartrate (LOPRESSOR) 25 MG tablet Take 1 tablet (25 mg total) by mouth 2 (two) times daily. Hold if heart rate less than 60 or blood pressure top number is less than 95 02/27/21  Yes Goodrich, Callie E, PA-C  mirtazapine (REMERON) 15 MG tablet Take 15 mg  by mouth at bedtime. 06/04/20  Yes [provider]  modafinil (PROVIGIL) 100 MG tablet Take 1 tablet (100 mg total) by mouth daily. 01/10/21  Yes Chesley Mires, MD  Multiple Vitamins-Minerals (MULTIVITAMIN WITH MINERALS) tablet Take 1 tablet by mouth daily. Centrum   Yes [provider]  Omega-3 Fatty Acids (FISH OIL) 1000 MG CAPS Take 1 capsule by mouth daily.   Yes [provider]  omeprazole (PRILOSEC OTC) 20 MG tablet Take 20 mg by mouth every evening.   Yes [provider]  Probiotic Product (PROBIOTIC DAILY) CAPS Take 1 capsule by mouth every other day.   Yes [provider]  prochlorperazine (COMPAZINE) 10 MG tablet Take 1 tablet (10 mg total) by mouth every 6 (six) hours as needed for nausea or vomiting. 02/01/21  Yes Curt Bears, MD  sertraline (ZOLOFT) 100 MG tablet Take 100 mg by mouth daily. 06/07/12  Yes [provider]  sertraline (ZOLOFT) 50 MG tablet Take 50 mg by mouth daily.   Yes [provider]  Tiotropium Bromide Monohydrate (SPIRIVA RESPIMAT) 2.5 MCG/ACT AERS Inhale 2 puffs into the lungs daily.   Yes [provider]  vitamin C (ASCORBIC ACID) 500 MG tablet Take 500 mg by mouth daily.   Yes [provider]  calcium-vitamin D (OSCAL WITH D) 250-125 MG-UNIT per tablet Take 1 tablet by mouth daily.    [provider]  Nyoka Cowden Tea 315 MG CAPS Take 315 mg by mouth daily.    [provider]  MEGARED OMEGA-3 KRILL OIL PO Take 1 capsule by mouth daily.    [provider]  nitroGLYCERIN (NITROSTAT) 0.4 MG SL tablet Place 0.4 mg under the tongue every 5 (five) minutes as needed for chest pain.    [provider]  OXYGEN Inhale 4-6 L into the lungs continuous.    [provider]     Critical care time: Ferol Luz Nishanth Mccaughan ACNP Acute Care Nurse Practitioner [place] Please consult Amion 04/11/2021, 10:14 AM

## 2021-04-11 NOTE — TOC Initial Note (Addendum)
Transition of Care [facility]) - Initial/Assessment Note    Patient Details  Name: James [name] MRN: [id number] Date of Birth: [birth year]  Transition of Care [facility]) CM/SW Contact:    Milas Gain, [place] Phone Number: 04/11/2021, 12:36 PM  Clinical Narrative:                  CSW received consult for possible SNF placement at time of discharge. CSW spoke with patient with patients spouse at bedside regarding PT recommendation of SNF placement at time of discharge. Patient and patients spouse reports patient comes from [facility] short term.Patient expressed understanding of PT recommendation and is agreeable to SNF placement at time of discharge. Patient reports preference for [facility]. CSW discussed insurance authorization process with patient and patients spouse. Patients spouse reports patient has received the COVID vaccines as well as 1 booster. CSW received consult for palliative services to follow patient at SNF. Patient and patients spouse agreeable for CSW to make referral to [facility] for palliative services to follow patient at SNF.CSW spoke with [place] with [facility] and made referral for palliative services to follow patient at snf.CSW spoke with [place] with [facility] place who confirmed patient has SNF bed at [facility] when medically ready. No further questions reported at this time. CSW to continue to follow and assist with discharge planning needs.   Expected Discharge Plan: Skilled Nursing Facility Barriers to Discharge: Continued Medical Work up   Patient Goals and CMS Choice Patient states their goals for this hospitalization and ongoing recovery are:: SNF CMS Medicare.gov Compare Post Acute Care list provided to:: Patient Choice offered to / list presented to : Patient  Expected Discharge Plan and Services Expected Discharge Plan: [facility] In-house Referral: Clinical Social Work     Living arrangements for the past 2 months: [place]                                      Prior Living Arrangements/Services Living arrangements for the past 2 months: [place] Lives with:: Facility Resident (From [facility] short term) Patient language and need for interpreter reviewed:: Yes Do you feel safe going back to the place where you live?: No   SNF  Need for Family Participation in Patient Care: Yes (Comment) Care giver support system in place?: Yes (comment)   Criminal Activity/Legal Involvement Pertinent to Current Situation/Hospitalization: No - Comment as needed  Activities of Daily Living   ADL Screening (condition at time of admission) Patient's cognitive ability adequate to safely complete daily activities?: Yes Is the patient deaf or have difficulty hearing?: No Does the patient have difficulty seeing, even when wearing glasses/contacts?: No Does the patient have difficulty concentrating, remembering, or making decisions?: No Patient able to express need for assistance with ADLs?: Yes Does the patient have difficulty dressing or bathing?: Yes Independently performs ADLs?: Yes (appropriate for developmental age)  Permission Sought/Granted Permission sought to share information with : Case Manager, Family Supports, Chartered certified accountant granted to share information with : Yes, Verbal Permission Granted  Share Information with NAME: [name]  Permission granted to share info w AGENCY: SNF  Permission granted to share info w Relationship: spouse  Permission granted to share info w Contact Information: [name] [phone]  Emotional Assessment Appearance:: Appears stated age Attitude/Demeanor/Rapport: Gracious Affect (typically observed): Calm Orientation: : Oriented to Self, Oriented to Place, Oriented to  Time, Oriented to Situation (WDL) Alcohol / Substance Use: Not Applicable Psych Involvement: No (comment)  Admission diagnosis:  Atrial flutter with rapid  ventricular response (HCC) [I48.92] Patient Active Problem List   Diagnosis Date Noted   GERD (gastroesophageal reflux disease) 04/10/2021   Hypomagnesemia 04/10/2021   Protein-calorie malnutrition, severe 04/10/2021   Atrial flutter with rapid ventricular response ([place]) 04/09/2021   Pleural effusion 04/09/2021   Hypokalemia 04/09/2021   Malnutrition of moderate degree 04/01/2021   Pressure injury of skin 03/31/2021   Failure to thrive in adult 03/31/2021   Dehydration 03/29/2021   Chronic respiratory failure with hypoxia ([place]) 03/29/2021   Thyroid nodule 03/29/2021   Bacteriuria 03/29/2021   Generalized weakness 03/29/2021   Secondary hypercoagulable state ([place]) 03/14/2021   Abnormal TSH 02/14/2021   Acute on chronic respiratory failure with hypoxia ([place]) 02/13/2021   Tachycardia, unspecified 02/11/2021   Paroxysmal atrial flutter ([place]) 02/11/2021   COVID-19 virus infection 02/11/2021   Hypertension    Hyperlipidemia associated with type 2 diabetes mellitus ([place])    Diabetes mellitus, type 2 ([place])    Depression    Protein calorie malnutrition ([place])    Chest pain 02/06/2021   Encounter for antineoplastic chemotherapy 02/01/2021   Endobronchial cancer, left ([place])    Chest pain, precordial 01/22/2021   AVM (arteriovenous malformation) of small bowel, 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   Iron deficiency anemia    Heme positive stool    History of colonic polyps    Primary cancer of left lower lobe of lung ([place]) 04/14/2013   Upper airway cough syndrome 04/14/2013   Hypoxemia 06/08/2012   Parkinson's disease ([place]) 2014   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

## 2021-04-11 NOTE — Progress Notes (Signed)
ANTICOAGULATION CONSULT NOTE   Pharmacy Consult for heparin Indication: atrial fibrillation  Allergies  Allergen Reactions   Aspirin Other (See Comments)    GI bleed   Codeine Itching   Morphine Itching and Nausea Only    Patient Measurements: Height: 6\' 1"  (185.4 cm) Weight: 85 kg (187 lb 6.3 oz) IBW/kg (Calculated) : 79.9 Heparin Dosing Weight: 85 kg  Vital Signs: Temp: 99.4 F (37.4 C) (02/16 1956) Temp Source: Oral (02/16 1956) BP: 103/77 (02/16 2307) Pulse Rate: 155 (02/16 2307)  Labs: Recent Labs    04/09/21 0245 04/09/21 2028 04/09/21 2028 04/10/21 0304 04/11/21 0426 04/11/21 0634 04/11/21 1206 04/11/21 2100  HGB 9.7*  --   --  9.5*  --  9.3*  --   --   HCT 32.6*  --   --  31.1*  --  30.8*  --   --   PLT 216  --   --  172  --  183  --   --   APTT  --  54*  --  68* 73*  --   --   --   LABPROT 27.4*  --   --   --   --   --   --   --   INR 2.6*  --   --   --   --   --   --   --   HEPARINUNFRC  --  0.46   < > 0.36 <0.10*  --  0.11* 0.22*  CREATININE 0.87  --   --  0.79 0.60*  --   --   --    < > = values in this interval not displayed.     Estimated Creatinine Clearance: 90.2 mL/min (A) (by C-G formula based on SCr of 0.6 mg/dL (L)).   Assessment: 25 YOM presenting with weakness and tachycardia, hx of afib on Eliquis PTA with last dose 2/13 @2330 , chronic anemia stable, plts 216.  Heparin level now 0.22  Goal of Therapy:  Heparin level 0.3-0.7 units/ml aPTT 66-102 seconds Monitor platelets by anticoagulation protocol: Yes   Plan:  Increase heparin gtt to 2200 units / hr Order heparin level in 8 hr  Monitor CBC, aPTT/HL, and s/sx of bleeding daily  Thank you Anette Guarneri, PharmD 04/11/2021 11:13 PM  Please check AMION for all [place] phone numbers After 10:00 PM, call [place] [phone]

## 2021-04-11 NOTE — Progress Notes (Signed)
Nutrition Follow-up  DOCUMENTATION CODES:   Severe malnutrition in context of chronic illness  INTERVENTION:  - Continue Boost Breeze po TID, each supplement provides 250 kcal and 9 grams of protein  - Continue ProSource Plus 30 mL BID   - Continue MVI daily   - Provide Magic cup TID with meals, each supplement provides 290 kcal and 9 grams of protein   NUTRITION DIAGNOSIS:   Severe Malnutrition related to chronic illness (stage IIIa non-small cell lung cancer) as evidenced by severe muscle depletion, energy intake < 75% for > or equal to 1 month, percent weight loss.  Ongoing   GOAL:   Patient will meet greater than or equal to 90% of their needs  Progressing; addressing via PO intake and nutritional supplements   MONITOR:   PO intake, Supplement acceptance, Labs, Weight trends  REASON FOR ASSESSMENT:   Malnutrition Screening Tool    ASSESSMENT:   Pt is a 76 y.o. male with medical history significant for stage IIIa non-small cell lung cancer (recently completed radiation, chemotherapy held due to functional decline), chronic respiratory failure with hypoxia on 4 L O2 via nasal cannula, COPD, Parkinson's disease, T2DM, HTN, HLD, iron deficiency anemia, paroxysmal atrial flutter on Eliquis, depression, who presented to the ED from his facility due to severe tachycardia and admitted for the evaluation of atrial flutter.  Per meal documentation, pt consumed 50% of breakfast today.   Met with pt at bedside. Pt resting and eyes closed majority of visit; very little verbal communication by pt. Pt's wife present in room and reports that pt drank most of the Boost Breeze supplement this morning and the entire 30 mL Prosource plus. Pt's meal tray was in room during visit and appeared untouched, except the chicken noodle soup that was 100% eaten this morning. Pt's wife reports that pt loves chicken noodle soup. Pt stated, "order two more chicken noodle soups," for later today. Per  conversation with pt's wife, it appears pt is consuming most of his calories from the nutritional supplements, applesauce and chicken noodle soups occasionally.   Encouraged pt's wife to continue to promote PO intake and providing nutritional supplements to pt to aid in caloric and protein intake. Discussed the importance of adequate PO intake and nutritional supplements to help meet nutritional needs. Dietetic intern will continue to follow-up with pt and pt's wife. Discussed magic cup dessert supplementation with pt and pt's wife and both agreeable to having this provided on pt's meal trays to aid in caloric and protein intake.   Weight history reviewed.   Labs reviewed:  Potassium: 3.2  Calcium: 8.1  Medications reviewed.   Diet Order:   Diet Order             Diet regular Room service appropriate? Yes; Fluid consistency: Thin  Diet effective now                   EDUCATION NEEDS:   Not appropriate for education at this time  Skin:  Skin Assessment: Skin Integrity Issues: Skin Integrity Issues:: Stage II Stage II: coccyx  Last BM:  2/14  Height:   Ht Readings from Last 1 Encounters:  04/09/21 $RemoveB'6\' 1"'QApLgnhs$  (1.854 m)    Weight:   Wt Readings from Last 1 Encounters:  04/09/21 85 kg    Ideal Body Weight:  83.6 kg  BMI:  Body mass index is 24.72 kg/m.  Estimated Nutritional Needs:   Kcal:  2100 - 2300  Protein:  105 -  120 grams  Fluid:  </= 2.1 L    Maryruth Hancock, Dietetic Intern 04/11/2021 11:59 AM

## 2021-04-11 NOTE — Progress Notes (Signed)
[facility]) Hospital Liaison Note  Notified by TOC manager of patient/family request for [facility] palliative services at home after discharge.   [facility] hospital liaison will follow patient for discharge disposition.   Please call with any hospice or outpatient palliative care related questions.   Thank you for the opportunity to participate in this patient's care.   Shanita Wicker, LCSW [facility] Hospital Liaison [phone]  

## 2021-04-11 NOTE — Progress Notes (Signed)
ANTICOAGULATION CONSULT NOTE - Follow Up Consult  Pharmacy Consult for heparin Indication: atrial fibrillation  Labs: Recent Labs    04/09/21 0245 04/09/21 2028 04/10/21 0304 04/11/21 0426  HGB 9.7*  --  9.5*  --   HCT 32.6*  --  31.1*  --   PLT 216  --  172  --   APTT  --  54* 68* 73*  LABPROT 27.4*  --   --   --   INR 2.6*  --   --   --   HEPARINUNFRC  --  0.46 0.36 <0.10*  CREATININE 0.87  --  0.79 0.60*    Assessment: 75yo male subtherapeutic on heparin now that anti-Xa lab effect from PTA Eliquis has dissipated; no infusion issues or signs of bleeding per RN.  Goal of Therapy:  Heparin level 0.3-0.7 units/ml   Plan:  Will increase heparin infusion by 3-4 units/kg/hr to 1800 units/hr and check level in 6 hours.    Wynona Neat, PharmD, BCPS  04/11/2021,5:14 AM

## 2021-04-11 NOTE — NC FL2 (Signed)
[place] LEVEL OF CARE SCREENING TOOL     IDENTIFICATION  Patient Name: James [name] Birthdate: 1945-07-18 Sex: male Admission Date (Current Location): 04/09/2021  [facility] and [place] Number:  Herbalist and Address:  [facility], [place] [address] [place], [place] [address]      Provider Number: [id number]  Attending Physician Name and Address:  [place] Eric J, DO  Relative Name and Phone Number:  [name] [phone]    Current Level of Care: Hospital Recommended Level of Care: [place] Prior Approval Number:    Date Approved/Denied:   PASRR Number: [id number]  Discharge Plan: SNF    Current Diagnoses: Patient Active Problem List   Diagnosis Date Noted   GERD (gastroesophageal reflux disease) 04/10/2021   Hypomagnesemia 04/10/2021   Protein-calorie malnutrition, severe 04/10/2021   Atrial flutter with rapid ventricular response ([place]) 04/09/2021   Pleural effusion 04/09/2021   Hypokalemia 04/09/2021   Malnutrition of moderate degree 04/01/2021   Pressure injury of skin 03/31/2021   Failure to thrive in adult 03/31/2021   Dehydration 03/29/2021   Chronic respiratory failure with hypoxia ([place]) 03/29/2021   Thyroid nodule 03/29/2021   Bacteriuria 03/29/2021   Generalized weakness 03/29/2021   Secondary hypercoagulable state ([place]) 03/14/2021   Abnormal TSH 02/14/2021   Acute on chronic respiratory failure with hypoxia ([place]) 02/13/2021   Tachycardia, unspecified 02/11/2021   Paroxysmal atrial flutter ([place]) 02/11/2021   COVID-19 virus infection 02/11/2021   Hypertension    Hyperlipidemia associated with type 2 diabetes mellitus ([place])    Diabetes mellitus, type 2 ([place])    Depression    Protein calorie malnutrition ([place])    Chest pain 02/06/2021   Encounter for antineoplastic chemotherapy 02/01/2021   Endobronchial cancer, left ([place])    Chest pain, precordial 01/22/2021   AVM (arteriovenous  malformation) of small bowel, 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   Iron deficiency anemia    Heme positive stool    History of colonic polyps    Primary cancer of left lower lobe of lung ([place]) 04/14/2013   Upper airway cough syndrome 04/14/2013   Hypoxemia 06/08/2012   Parkinson's disease ([place]) 2014   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    Orientation RESPIRATION BLADDER Height & Weight     Self, Time, Situation, Place (WDL)  O2 (Nasal Cannula 4 liters) Continent, External catheter (External Urinary Catheter) Weight: 187 lb 6.3 oz (85 kg) Height:  6\' 1"  (185.4 cm)  BEHAVIORAL SYMPTOMS/MOOD NEUROLOGICAL BOWEL NUTRITION STATUS      Continent (WDL) Diet (Please see discharge summary)  AMBULATORY STATUS COMMUNICATION OF NEEDS Skin   Extensive Assist Verbally Other (Comment) (appropriate for ethnicity,clammy,ecchymosis,arm,bilateral,PI coccyx,mid stage 2)                       Personal Care Assistance Level of Assistance  Bathing, Feeding, Dressing Bathing Assistance: Maximum assistance Feeding assistance: Independent (able to feed self) Dressing Assistance: Maximum assistance     Functional Limitations Info  Sight, Hearing, Speech Sight Info: Impaired Hearing Info: Adequate Speech Info: Adequate    SPECIAL CARE FACTORS FREQUENCY  PT (By licensed PT), OT (By licensed OT)     PT Frequency: 5x min weekly OT Frequency: 5x min weekly            Contractures Contractures Info: Not present    Additional Factors Info  Code Status, Allergies, Psychotropic Code Status Info: Partial Allergies Info: Aspirin,Codeine,Morphine Psychotropic Info: buPROPion (WELLBUTRIN XL) 24 hr  tablet 150 mg daily,sertraline (ZOLOFT) tablet 150 mg daily         Current Medications (04/11/2021):  This is the current hospital active medication list Current Facility-Administered Medications  Medication Dose Route Frequency Provider Last Rate Last Admin   (feeding supplement) PROSource Plus liquid 30 mL  30  mL Oral BID BM [place] Eric J, DO   30 mL at 04/11/21 1331   0.9 %  sodium chloride infusion   Intravenous Continuous Boisseau, Hayley, PA 10 mL/hr at 04/11/21 0529 Infusion Verify at 04/11/21 0529   acetaminophen (TYLENOL) tablet 650 mg  650 mg Oral Q6H PRN Fuller Plan A, MD   650 mg at 04/11/21 1660   Or   acetaminophen (TYLENOL) suppository 650 mg  650 mg Rectal Q6H PRN Fuller Plan A, MD       acidophilus (RISAQUAD) capsule 1 capsule  1 capsule Oral QODAY Smith, Rondell A, MD   1 capsule at 04/11/21 0944   amiodarone (NEXTERONE PREMIX) 360-4.14 MG/200ML-% (1.8 mg/mL) IV infusion  30 mg/hr Intravenous Continuous Minus Breeding, MD 16.67 mL/hr at 04/11/21 0831 30 mg/hr at 04/11/21 0831   arformoterol (BROVANA) nebulizer solution 15 mcg  15 mcg Nebulization Q12H Smith, Rondell A, MD   15 mcg at 04/11/21 0848   atorvastatin (LIPITOR) tablet 20 mg  20 mg Oral QHS Smith, Rondell A, MD   20 mg at 04/10/21 2200   buPROPion (WELLBUTRIN XL) 24 hr tablet 150 mg  150 mg Oral Daily Smith, Rondell A, MD   150 mg at 04/11/21 0941   Carbidopa-Levodopa ER (SINEMET CR) 25-100 MG tablet controlled release 1.5 tablet  1.5 tablet Oral TID Fuller Plan A, MD   1.5 tablet at 04/11/21 6301   Chlorhexidine Gluconate Cloth 2 % PADS 6 each  6 each Topical Daily Maudie Flakes, MD   6 each at 04/11/21 1051   donepezil (ARICEPT) tablet 10 mg  10 mg Oral QHS Smith, Rondell A, MD   10 mg at 04/10/21 2200   famotidine (PEPCID) tablet 20 mg  20 mg Oral Daily Tamala Julian, Rondell A, MD   20 mg at 04/11/21 6010   feeding supplement (BOOST / RESOURCE BREEZE) liquid 1 Container  1 Container Oral TID BM [place] Donnamarie Poag, DO   1 Container at 04/11/21 1331   heparin ADULT infusion 100 units/mL (25000 units/279mL)  2,000 Units/hr Intravenous Continuous [place] Eric J, DO 20 mL/hr at 04/11/21 1325 2,000 Units/hr at 04/11/21 1325   levalbuterol (XOPENEX) nebulizer solution 0.63 mg  0.63 mg Nebulization BID [place] Eric J, DO        lidocaine (LIDODERM) 5 % 2 patch  2 patch Transdermal Daily Smith, Rondell A, MD       lidocaine-prilocaine (EMLA) cream 1 application  1 application Topical PRN Fuller Plan A, MD       metoprolol tartrate (LOPRESSOR) tablet 25 mg  25 mg Oral BID Minus Breeding, MD   25 mg at 04/11/21 9323   multivitamin with minerals tablet 1 tablet  1 tablet Oral Daily [place] Eric J, DO   1 tablet at 04/11/21 0941   pantoprazole (PROTONIX) EC tablet 40 mg  40 mg Oral QPM Smith, Rondell A, MD   40 mg at 04/10/21 1729   sertraline (ZOLOFT) tablet 150 mg  150 mg Oral Daily Smith, Rondell A, MD   150 mg at 04/11/21 0942   sodium chloride flush (NS) 0.9 % injection 10-40 mL  10-40 mL Intracatheter Q12H Maudie Flakes,  MD   10 mL at 04/11/21 0046   sodium chloride flush (NS) 0.9 % injection 10-40 mL  10-40 mL Intracatheter PRN Maudie Flakes, MD       sodium chloride flush (NS) 0.9 % injection 3 mL  3 mL Intravenous Q12H Smith, Rondell A, MD   3 mL at 04/11/21 0046   umeclidinium bromide (INCRUSE ELLIPTA) 62.5 MCG/ACT 1 puff  1 puff Inhalation Daily Fuller Plan A, MD   1 puff at 04/11/21 1210     Discharge Medications: Please see discharge summary for a list of discharge medications.  Relevant Imaging Results:  Relevant Lab Results:   Additional Information [phone], Both Covid Vaccines 1 booster  Milas Gain, LCSWA

## 2021-04-11 NOTE — Evaluation (Signed)
Occupational Therapy Evaluation Patient Details Name: James [name] MRN: [id number] DOB: 1945-10-09 Today's Date: 04/11/2021   History of Present Illness 76 y.o. male admitted 2/14 with SOB and Aflutter from [facility]. PMHx: [place] lung CA, COPD on 4L, paroxysmal atrial flutter on Eliquis, Parkinson's disease, T2DM, HTN, HLD, iron deficiency anemia, depression   Clinical Impression   PTA pt at [facility] for rehab. Before his recent hospitalizations, he had recently started using a RW for ambulation and required min A for ADL tasks due to symptoms from his Parkinson's disease. Session limited to bed level today due to sustained HR in the 150s. Recommend pt return to [facility] for continued rehab. Pt/wife's goals is for him to eventually return home after rehab. Acute OT to follow.     Recommendations for follow up therapy are one component of a multi-disciplinary discharge planning process, led by the attending physician.  Recommendations may be updated based on patient status, additional functional criteria and insurance authorization.   Follow Up Recommendations  Skilled nursing-short term rehab (<3 hours/day)    Assistance Recommended at Discharge Frequent or constant Supervision/Assistance  Patient can return home with the following Two people to help with walking and/or transfers;Two people to help with bathing/dressing/bathroom;Assistance with feeding;Assistance with cooking/housework;Direct supervision/assist for medications management;Direct supervision/assist for financial management;Assist for transportation;Help with stairs or ramp for entrance    Functional Status Assessment  Patient has had a recent decline in their functional status and demonstrates the ability to make significant improvements in function in a reasonable and predictable amount of time.  Equipment Recommendations  None recommended by OT    Recommendations for Other Services  (PAlliative Consult) - already  involved     Precautions / Restrictions Precautions Precautions: Fall;Other (comment) Precaution Comments: on 4 L at home, L eye blind, watch HR      Mobility Bed Mobility Overal bed mobility: Needs Assistance Bed Mobility: Rolling Rolling: Min assist              Transfers                   General transfer comment: unable due to HR 155      Balance Overall balance assessment: Needs assistance, History of Falls                                         ADL either performed or assessed with clinical judgement   ADL Overall ADL's : Needs assistance/impaired Eating/Feeding: Minimal assistance Eating/Feeding Details (indicate cue type and reason): increaed spillage due to parkinson's; may benefit form AE Grooming: Wash/dry hands;Wash/dry face;Oral care;Set up   Upper Body Bathing: Moderate assistance   Lower Body Bathing: Maximal assistance;Bed level   Upper Body Dressing : Moderate assistance   Lower Body Dressing: Total assistance;Bed level               Functional mobility during ADLs:  (deferred due to sustained HR in 150s)       Vision Baseline Vision/History: 1 Wears glasses Ability to See in Adequate Light:  (blind L eye)       Perception     Praxis      Pertinent Vitals/Pain Pain Assessment Pain Assessment: Faces Faces Pain Scale: Hurts little more Pain Location: chest Pain Descriptors / Indicators: Aching Pain Intervention(s): Limited activity within patient's tolerance     Hand Dominance Right  Extremity/Trunk Assessment Upper Extremity Assessment Upper Extremity Assessment: Generalized weakness   Lower Extremity Assessment Lower Extremity Assessment: Defer to PT evaluation   Cervical / Trunk Assessment Cervical / Trunk Assessment: Kyphotic   Communication Communication Communication: No difficulties   Cognition Arousal/Alertness: Lethargic Behavior During Therapy: Flat affect Overall Cognitive  Status: Impaired/Different from baseline Area of Impairment: Attention, Memory, Awareness, Safety/judgement, Problem solving                   Current Attention Level: Selective Memory: Decreased short-term memory   Safety/Judgement: Decreased awareness of safety, Decreased awareness of deficits Awareness: Emergent Problem Solving: Slow processing General Comments: most likely affected by lack of sleep as well     General Comments  blancheable red area o sacrum; bed pad urine soaked as male purewick not working;turned onto side with pillow under hip    Exercises Exercises: Other exercises Other Exercises Other Exercises: encouraged general BUE ROM with wife   Shoulder Instructions      Home Living Family/patient expects to be discharged to:: Skilled nursing facility Living Arrangements: Spouse/significant other Available Help at Discharge: Available 24 hours/day Type of Home: House Home Access: Stairs to enter Technical brewer of Steps: 3   Home Layout: Two level;Bed/bath upstairs Alternate Level Stairs-Number of Steps: flight with landing             Home Equipment: Conservation officer, nature (2 wheels)          Prior Functioning/Environment Prior Level of Function : Needs assist         Mobility (physical): Bed mobility;Transfers;Gait ADLs (physical): Dressing;IADLs Mobility Comments: safety, tends to  get up when cued not to ADLs Comments: assist for bathing and dressing at home and since transition to SNF        OT Problem List: Impaired balance (sitting and/or standing);Decreased safety awareness;Impaired UE functional use;Decreased knowledge of precautions;Decreased knowledge of use of DME or AE;Cardiopulmonary status limiting activity;Decreased activity tolerance;Pain;Decreased strength      OT Treatment/Interventions: Self-care/ADL training;Therapeutic exercise;Energy conservation;DME and/or AE instruction;Therapeutic activities;Patient/family  education;Balance training    OT Goals(Current goals can be found in the care plan section) Acute Rehab OT Goals Patient Stated Goal: to return to [facility] for more rehab OT Goal Formulation: With patient/family Time For Goal Achievement: 04/25/21 Potential to Achieve Goals: Good  OT Frequency: Min 2X/week    Co-evaluation PT/OT/SLP Co-Evaluation/Treatment: Yes Reason for Co-Treatment: To address functional/ADL transfers PT goals addressed during session: Mobility/safety with mobility OT goals addressed during session: ADL's and self-care      AM-PAC OT "6 Clicks" Daily Activity     Outcome Measure Help from another person eating meals?: A Little Help from another person taking care of personal grooming?: A Little Help from another person toileting, which includes using toliet, bedpan, or urinal?: Total Help from another person bathing (including washing, rinsing, drying)?: A Lot Help from another person to put on and taking off regular upper body clothing?: A Lot Help from another person to put on and taking off regular lower body clothing?: Total 6 Click Score: 12   End of Session Equipment Utilized During Treatment: Oxygen (4L) Nurse Communication: Mobility status;Other (comment) (HR)  Activity Tolerance: Treatment limited secondary to medical complications (Comment) Patient left: in bed;with call bell/phone within reach;with bed alarm set;with family/visitor present (modified chair position)  OT Visit Diagnosis: Unsteadiness on feet (R26.81);Other abnormalities of gait and mobility (R26.89);History of falling (Z91.81);Repeated falls (R29.6)  Time: 7867-6720 OT Time Calculation (min): 25 min Charges:  OT General Charges $OT Visit: 1 Visit OT Evaluation $OT Eval Moderate Complexity: Waucoma, OT/L   Acute OT Clinical Specialist Acute Rehabilitation Services Pager [phone] Office [phone]   [facility] 04/11/2021, 10:50 AM

## 2021-04-11 NOTE — Progress Notes (Signed)
ANTICOAGULATION CONSULT NOTE   Pharmacy Consult for heparin Indication: atrial fibrillation  Allergies  Allergen Reactions   Aspirin Other (See Comments)    GI bleed   Codeine Itching   Morphine Itching and Nausea Only    Patient Measurements: Height: 6\' 1"  (185.4 cm) Weight: 85 kg (187 lb 6.3 oz) IBW/kg (Calculated) : 79.9 Heparin Dosing Weight: 85 kg  Vital Signs: Temp: 97.8 F (36.6 C) (02/16 1239) Temp Source: Oral (02/16 1239) BP: 112/84 (02/16 1239) Pulse Rate: 78 (02/16 1239)  Labs: Recent Labs    04/09/21 0245 04/09/21 2028 04/09/21 2028 04/10/21 0304 04/11/21 0426 04/11/21 0634 04/11/21 1206  HGB 9.7*  --   --  9.5*  --  9.3*  --   HCT 32.6*  --   --  31.1*  --  30.8*  --   PLT 216  --   --  172  --  183  --   APTT  --  54*  --  68* 73*  --   --   LABPROT 27.4*  --   --   --   --   --   --   INR 2.6*  --   --   --   --   --   --   HEPARINUNFRC  --  0.46   < > 0.36 <0.10*  --  0.11*  CREATININE 0.87  --   --  0.79 0.60*  --   --    < > = values in this interval not displayed.     Estimated Creatinine Clearance: 90.2 mL/min (A) (by C-G formula based on SCr of 0.6 mg/dL (L)).   Medical History: Past Medical History:  Diagnosis Date   Allergy    seasonal   Blind left eye    legally   Blood transfusion    2002   COPD (chronic obstructive pulmonary disease) (HCC)    Depression    Diabetes mellitus, type 2 (HCC)    Diverticulosis    Dyspnea    ED (erectile dysfunction)    Glaucoma    Hyperlipidemia    Hypertension    Insomnia    Internal and external bleeding hemorrhoids    Iron deficiency anemia    Lung cancer ([place])    Osteoarthritis    Parkinson's disease ([place]) 2014   Dr. [name] PCP  and Dr. [name] at [facility]   Paroxysmal atrial flutter [facility])    Rosacea    Tremor of both hands    Tubular adenoma of colon 03/28/2011    Assessment: 53 YOM presenting with weakness and tachycardia, hx of afib on Eliquis PTA with last dose 2/13 @2330 ,  chronic anemia stable, plts 216.  Heparin level is subtherapeutic at 0.11, on 1800 units/hr - levels correlating so can monitor heparin levels. Hgb 9.3, plt 183. No s/sx of bleeding or infusion issues.  Goal of Therapy:  Heparin level 0.3-0.7 units/ml aPTT 66-102 seconds Monitor platelets by anticoagulation protocol: Yes   Plan:  Increase heparin gtt to 2000 units/hr Order heparin level in 8 hr  Monitor CBC, aPTT/HL, and s/sx of bleeding daily  Antonietta Jewel, PharmD, BCCCP Clinical Pharmacist  Phone: [phone] 04/11/2021 1:01 PM  Please check AMION for all [place] phone numbers After 10:00 PM, call [place] [phone]

## 2021-04-11 NOTE — Evaluation (Signed)
Physical Therapy Evaluation Patient Details Name: James [name] MRN: [id number] DOB: 1945-05-30 Today's Date: 04/11/2021  History of Present Illness  76 y.o. male admitted 2/14 with SOB and Aflutter from [facility]. PMHx: [place] lung CA, COPD on 4L, paroxysmal atrial flutter on Eliquis, Parkinson's disease, T2DM, HTN, HLD, iron deficiency anemia, depression  Clinical Impression  Pt reports having not walked since transition to [place] and requiring assist for OOB. Pt with HR 147-155 sustained with bed level mobility and unable to progress to sitting or OOB this session. Pt with decreased strength, balance, function and safety who will benefit from acute therapy to maximize mobility and decrease burden of care.      4L at 100%   Recommendations for follow up therapy are one component of a multi-disciplinary discharge planning process, led by the attending physician.  Recommendations may be updated based on patient status, additional functional criteria and insurance authorization.  Follow Up Recommendations Skilled nursing-short term rehab (<3 hours/day)    Assistance Recommended at Discharge Frequent or constant Supervision/Assistance  Patient can return home with the following  A lot of help with bathing/dressing/bathroom;Two people to help with walking and/or transfers;Assistance with cooking/housework;Assist for transportation;Help with stairs or ramp for entrance    Equipment Recommendations None recommended by PT  Recommendations for Other Services       Functional Status Assessment Patient has had a recent decline in their functional status and/or demonstrates limited ability to make significant improvements in function in a reasonable and predictable amount of time     Precautions / Restrictions Precautions Precautions: Fall;Other (comment) Precaution Comments: on 4 L at home, L eye blind, watch HR      Mobility  Bed Mobility Overal bed mobility: Needs Assistance Bed  Mobility: Rolling Rolling: Min assist         General bed mobility comments: min assist to roll bil with mod +2 to slide toward [facility] for pericare and linen change. Unable to progress to sitting due to HR 147-155 sustained with bed level mobility    Transfers                   General transfer comment: unable due to HR 155    Ambulation/Gait                  Stairs            Wheelchair Mobility    Modified Rankin (Stroke Patients Only)       Balance Overall balance assessment: Needs assistance, History of Falls                                           Pertinent Vitals/Pain Pain Assessment Pain Score: 5  Pain Location: chest Pain Descriptors / Indicators: Aching Pain Intervention(s): Limited activity within patient's tolerance, Monitored during session, Repositioned    Home Living Family/patient expects to be discharged to:: Skilled nursing facility Living Arrangements: Spouse/significant other Available Help at Discharge: Available 24 hours/day Type of Home: House Home Access: Stairs to enter   [employer] of Steps: 3 Alternate Level Stairs-Number of Steps: flight with landing Home Layout: Two level;Bed/bath upstairs Home Equipment: Conservation officer, nature (2 wheels)      Prior Function Prior Level of Function : Needs assist         Mobility (physical): Bed mobility;Transfers;Gait ADLs (physical): Dressing;IADLs Mobility Comments: safety, tends  to  get up when cued not to ADLs Comments: assist for bathing and dressing at home and since transition to SNF     Hand Dominance   Dominant Hand: (P) Right    Extremity/Trunk Assessment   Upper Extremity Assessment Upper Extremity Assessment: Defer to OT evaluation    Lower Extremity Assessment Lower Extremity Assessment: Generalized weakness    Cervical / Trunk Assessment Cervical / Trunk Assessment: Kyphotic  Communication   Communication: No difficulties   Cognition Arousal/Alertness: Awake/alert Behavior During Therapy: Flat affect Overall Cognitive Status: Within Functional Limits for tasks assessed                                          General Comments      Exercises     Assessment/Plan    PT Assessment Patient needs continued PT services  PT Problem List Decreased strength;Decreased balance;Decreased knowledge of precautions;Decreased range of motion;Decreased mobility;Decreased knowledge of use of DME;Decreased activity tolerance;Decreased coordination;Decreased safety awareness       PT Treatment Interventions DME instruction;Therapeutic activities;Gait training;Therapeutic exercise;Patient/family education;Functional mobility training;Balance training    PT Goals (Current goals can be found in the Care Plan section)  Acute Rehab PT Goals Patient Stated Goal: to be able to move PT Goal Formulation: With patient/family Time For Goal Achievement: 04/25/21 Potential to Achieve Goals: Fair    Frequency Min 2X/week     Co-evaluation PT/OT/SLP Co-Evaluation/Treatment: Yes Reason for Co-Treatment: For patient/therapist safety PT goals addressed during session: Mobility/safety with mobility         AM-PAC PT "6 Clicks" Mobility  Outcome Measure Help needed turning from your back to your side while in a flat bed without using bedrails?: A Little Help needed moving from lying on your back to sitting on the side of a flat bed without using bedrails?: A Lot Help needed moving to and from a bed to a chair (including a wheelchair)?: Total Help needed standing up from a chair using your arms (e.g., wheelchair or bedside chair)?: Total Help needed to walk in hospital room?: Total Help needed climbing 3-5 steps with a railing? : Total 6 Click Score: 9    End of Session   Activity Tolerance: Treatment limited secondary to medical complications (Comment) Patient left: in bed;with call bell/phone within  reach;with family/visitor present;with bed alarm set (pt rolled to left with pillow under right hip) Nurse Communication: Mobility status;Need for lift equipment PT Visit Diagnosis: Muscle weakness (generalized) (M62.81);Difficulty in walking, not elsewhere classified (R26.2);Other abnormalities of gait and mobility (R26.89);Unsteadiness on feet (R26.81)    Time: 9767-3419 PT Time Calculation (min) (ACUTE ONLY): 13 min   Charges:   PT Evaluation $PT Eval Moderate Complexity: 1 Mod          Ridgely Anastacio P, PT Acute Rehabilitation Services Pager: [phone] Office: [phone]   Sandy Salaam Badr Piedra 04/11/2021, 10:39 AM

## 2021-04-11 NOTE — Progress Notes (Signed)
PROGRESS NOTE    James [name]  NTI:[id number] DOB: 26-Apr-1945 DOA: 04/09/2021 PCP: [name], MD    Brief Narrative:  James [name]  is a 76 y.o. male with past medical history significant for stage IIIa non-small cell lung cancer, recently completed radiation (follows with Dr. Julien Nordmann, chemotherapy held due to functional decline), COPD, chronic respiratory failure with hypoxia (on 4 L supplemental oxygen via nasal cannula at home), paroxysmal atrial flutter on Eliquis, Parkinson's disease, diabetes mellitus type 2, hypertension, hyperlipidemia, iron deficiency anemia, and depression who presented to [facility] ED from [facility] due to severe tachycardia.  Patient reports that he has been having increased shortness of breath.  He had been at the nursing facility for the last month and notes that is also around the time that he stopped receiving chemotherapy treatments.  Over the last 3 months he reports that he is lost approximately 50 pounds. Patient had been on Ceftin since 2/8.   In route with EMS heart rate was noted to be in the 150s.  In the ED patient was noted to be afebrile, heart rates elevated into the 150s in atrial flutter, respirations elevated to 24, blood pressure soft with maps greater than 65, and O2 saturations otherwise maintained on 4 L.  Labs significant for hemoglobin 9.7, potassium 3 albumin 1.8, BNP 177.3.  Chest x-ray noted complete opacification of the left lung possibly representing moderate to large pleural effusion with atelectasis/infiltrate, or possibly postobstructive pneumonitis.  He had initially been given 500 mL of normal saline IV fluid bolus and started on Cardizem drip.  Hospitalist service consulted for further evaluation and management of atrial flutter with RVR.     Assessment & Plan:   Assessment and Plan: * Atrial flutter with rapid ventricular response ([place])- (present on admission) Patient presented after being found to be in atrial flutter  with heart rates into the 150s.  Once in the ED patient was started on a Cardizem drip with some improvement in heart rates.CHA2DS2-VASc score = 5.  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  2022 with LVEF 55-60%, mild concentric LVH, LA/RA moderately dilated, small pericardial effusion without tamponade, mild dilation ascending aorta measuring 40 mm, IVC dilated.  TSH 1.111 on 02/18/2021. --Cardiology following, appreciate assistance --Continue amiodarone drip (on cardizem 300mg  daily outpt) --started on metoprolol tartrate 25 mg p.o. BID today --Heparin drip (Eliquis outpatient) --Continue to monitor on telemetry  Pleural effusion- (present on admission) Patient presented with opacification of the left lung field.  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 s/p IR left thoracentesis 2/15 with removal 2 L amber/blood-tinged fluid with repeat chest x-ray thereafter showing continued complete opacification of the left hemithorax.  CT chest with contrast 2/15 with complete opacification left hemithorax with abrupt cut off left mainstem bronchus concerning for mucous plug versus tumor invasion. --Pleural fluid culture: Pending --Pleural fluid cytology: Pending -- Pulmonology consulted for consideration of bronchoscopy today  Primary cancer of left lower lobe of lung ([place])- (present on admission) Follows with medical oncology outpatient, Dr. Julien Nordmann.  Despite thoracentesis, patient continues with persistent left sided opacification concerning for worsening/progressive malignancy.  Chemotherapy has been on hold due to functional decline per oncology notes. --Partial code with DNI --Palliative care consulted for assistance with goals of care/medical decision making; likely poor prognosis and hospice candidate.  COPD with emphysema ([place])- (present on admission) Home regimen  includes albuterol MDI as  needed, Spiriva, formoterol neb twice daily, Xopenex neb 3 times daily as needed. --Brovana neb every 12 hours --Incruse Ellipta 1 puff daily --Continue home oxygen, 4 L per nasal cannula  Chronic respiratory failure with hypoxia ([place])- (present on admission) Patient noted to have O2 saturations maintained on home 4 L of oxygen. --Continue monitor SPO2, maintain SPO2 greater than 90%  Hypokalemia- (present on admission) Potassium 3.2 this morning, will replete. (goal K >/= 4.0 and Mag >/= 2.0) --Repeat electrolytes in a.m.  Hyperlipidemia associated with type 2 diabetes mellitus ([place])- (present on admission) Patient has known history of diabetes, but is well controlled last hemoglobin A1c was 5 on 2/5.  Admission glucose 94.Marland Kitchen  Home medication regimen includes metformin 500 mg twice daily and atorvastatin 20 mg nightly. --Continue atorvastatin 20 mg p.o. daily --Hold metformin while inpatient --Continue monitor glucose levels, if elevated, will start sliding scale insulin  Iron deficiency anemia- (present on admission) Chronic.  Hemoglobin stable and at baseline. --Continue to monitor  Parkinson's disease ([place])- (present on admission) --Continue Sinemet CR 25-100mg  1.5 tablets TID --Donepezil 10 mg p.o. nightly  Protein calorie malnutrition ([place])- (present on admission) Body mass index is 24.72 kg/m. On admission albumin was noted to be low at 1.8. Nutrition Status: Nutrition Problem: Severe Malnutrition Etiology: chronic illness (stage IIIa non-small cell lung cancer) Signs/Symptoms: severe muscle depletion, energy intake < 75% for > or equal to 1 month, percent weight loss Percent weight loss: 6 % (in one month) Interventions: Boost Breeze, Liberalize Diet, MVI, Prostat -- Dietitian following, encourage increased oral intake, supplementation  Depression- (present on admission) --Wellbutrin 150 mg p.o. daily --Sertraline 150 mg p.o.  daily  Hypomagnesemia- (present on admission) Magnesium 1.9 this morning -- Repeat magnesium level in a.m.  GERD (gastroesophageal reflux disease)- (present on admission) --Pepcid 20 mg p.o. daily --Protonix 40 mg p.o. daily     DVT prophylaxis:   Heparin drip   Code Status: Partial Code Family Communication: Updated spouse present at bedside this morning  Disposition Plan:  Level of care: Progressive Status is: Inpatient Remains inpatient appropriate because: Remains on amiodarone and heparin drip, pending pulmonology consultation for consideration of bronchoscopy for persistent left opacified hemithorax concerning for mucous plug versus tumor invasion of the left mainstem bronchus.  Also pending palliative care consultation.  Consultants:  Cardiology Palliative care PCCM  Procedures:  IR thoracentesis, left 2/15; 2 L removed  Antimicrobials:  None   Subjective: Patient seen examined at bedside, resting comfortably.  Sleeping but arousable.  Spouse present.  Spouse reports he had a poor night last night with shortness of breath and difficulty sleeping.  Discussed thoracentesis yesterday with persistent left hemithorax opacification and CT findings concerning for mucous plug versus further tumor invasion of the left mainstem bronchus.  Patient and spouse desire pulmonology evaluation for consideration of bronchoscopy.  Discussed with spouse, given his functional decline and now off of chemotherapy that his overall prognosis looks extremely poor; she seems to be fairly understanding.  No other questions or concerns at this time.  Patient denies headache, no fever/chills, no nausea/vomiting/diarrhea, no chest pain, no palpitations, no abdominal pain, no fatigue, no paresthesias.  No acute events overnight per nursing staff  Objective: Vitals:   04/11/21 0750 04/11/21 0848 04/11/21 0849 04/11/21 0943  BP: 116/62   112/84  Pulse: (!) 154   (!) 142  Resp: 18     Temp: 98.3 F  (36.8 C)     TempSrc: Oral     SpO2:  98% 100% 99%   Weight:      Height:        Intake/Output Summary (Last 24 hours) at 04/11/2021 1019 Last data filed at 04/11/2021 0529 Gross per 24 hour  Intake 1961.82 ml  Output 700 ml  Net 1261.82 ml   Filed Weights   04/09/21 0400  Weight: 85 kg    Examination:  Physical Exam: GEN: NAD, alert and oriented x 3, chronically ill/cachectic in appearance, appears older than stated age HEENT: NCAT, PERRL, EOMI, sclera clear, MMM PULM: Decreased breath sounds from left apex to base, no crackles/wheezing, normal respiratory effort on 4 L nasal cannula which is his baseline CV: Tachycardic, irregularly irregular rhythm w/o M/G/R GI: abd soft, NTND, NABS, no R/G/M MSK: no peripheral edema, muscle strength globally intact 5/5 bilateral upper/lower extremities NEURO: CN II-XII intact, no focal deficits, sensation to light touch intact PSYCH: normal mood/affect Integumentary: dry/intact, no rashes or wounds    Data Reviewed: I have personally reviewed following labs and imaging studies  CBC: Recent Labs  Lab 04/09/21 0245 04/10/21 0304 04/11/21 0634  WBC 10.3 7.2 5.2  HGB 9.7* 9.5* 9.3*  HCT 32.6* 31.1* 30.8*  MCV 82.7 82.5 82.4  PLT 216 172 756   Basic Metabolic Panel: Recent Labs  Lab 04/09/21 0245 04/09/21 0850 04/10/21 0304 04/11/21 0426  NA 137  --  138 137  K 3.0*  --  3.4* 3.2*  CL 96*  --  97* 96*  CO2 29  --  30 32  GLUCOSE 94  --  100* 130*  BUN 14  --  11 7*  CREATININE 0.87  --  0.79 0.60*  CALCIUM 8.8*  --  8.6* 8.1*  MG  --  1.5* 1.7 1.7   GFR: Estimated Creatinine Clearance: 90.2 mL/min (A) (by C-G formula based on SCr of 0.6 mg/dL (L)). Liver Function Tests: Recent Labs  Lab 04/09/21 0245  AST 14*  ALT 9  ALKPHOS 70  BILITOT 0.2*  PROT 5.1*  ALBUMIN 1.8*   No results for input(s): LIPASE, AMYLASE in the last 168 hours. No results for input(s): AMMONIA in the last 168 hours. Coagulation  Profile: Recent Labs  Lab 04/09/21 0245  INR 2.6*   Cardiac Enzymes: No results for input(s): CKTOTAL, CKMB, CKMBINDEX, TROPONINI in the last 168 hours. BNP (last 3 results) No results for input(s): PROBNP in the last 8760 hours. HbA1C: No results for input(s): HGBA1C in the last 72 hours. CBG: No results for input(s): GLUCAP in the last 168 hours. Lipid Profile: No results for input(s): CHOL, HDL, LDLCALC, TRIG, CHOLHDL, LDLDIRECT in the last 72 hours. Thyroid Function Tests: No results for input(s): TSH, T4TOTAL, FREET4, T3FREE, THYROIDAB in the last 72 hours. Anemia Panel: No results for input(s): VITAMINB12, FOLATE, FERRITIN, TIBC, IRON, RETICCTPCT in the last 72 hours. Sepsis Labs: Recent Labs  Lab 04/09/21 0850  PROCALCITON 0.10    Recent Results (from the past 240 hour(s))  Resp Panel by RT-PCR (Flu A&B, Covid) Nasopharyngeal Swab     Status: None   Collection Time: 04/02/21  2:48 PM   Specimen: Nasopharyngeal Swab; Nasopharyngeal(NP) swabs in vial transport medium  Result Value Ref Range Status   SARS Coronavirus 2 by RT PCR NEGATIVE NEGATIVE Final    Comment: (NOTE) SARS-CoV-2 target nucleic acids are NOT DETECTED.  The SARS-CoV-2 RNA is generally detectable in upper respiratory specimens during the acute phase of infection. The lowest concentration of SARS-CoV-2 viral copies this assay can detect is  138 copies/mL. A negative result does not preclude SARS-Cov-2 infection and should not be used as the sole basis for treatment or other patient management decisions. A negative result may occur with  improper specimen collection/handling, submission of specimen other than nasopharyngeal swab, presence of viral mutation(s) within the areas targeted by this assay, and inadequate number of viral copies(<138 copies/mL). A negative result must be combined with clinical observations, patient history, and epidemiological information. The expected result is  Negative.  Fact Sheet for Patients:  EntrepreneurPulse.com.au  Fact Sheet for Healthcare Providers:  IncredibleEmployment.be  This test is no t yet approved or cleared by the [place] FDA and  has been authorized for detection and/or diagnosis of SARS-CoV-2 by FDA under an Emergency Use Authorization (EUA). This EUA will remain  in effect (meaning this test can be used) for the duration of the COVID-19 declaration under Section 564(b)(1) of the Act, 21 U.S.C.section 360bbb-3(b)(1), unless the authorization is terminated  or revoked sooner.       Influenza A by PCR NEGATIVE NEGATIVE Final   Influenza B by PCR NEGATIVE NEGATIVE Final    Comment: (NOTE) The Xpert Xpress SARS-CoV-2/FLU/RSV plus assay is intended as an aid in the diagnosis of influenza from Nasopharyngeal swab specimens and should not be used as a sole basis for treatment. Nasal washings and aspirates are unacceptable for Xpert Xpress SARS-CoV-2/FLU/RSV testing.  Fact Sheet for Patients: EntrepreneurPulse.com.au  Fact Sheet for Healthcare Providers: IncredibleEmployment.be  This test is not yet approved or cleared by the [place] FDA and has been authorized for detection and/or diagnosis of SARS-CoV-2 by FDA under an Emergency Use Authorization (EUA). This EUA will remain in effect (meaning this test can be used) for the duration of the COVID-19 declaration under Section 564(b)(1) of the Act, 21 U.S.C. section 360bbb-3(b)(1), unless the authorization is terminated or revoked.  Performed at [facility], [address], [place], [place] [address]   Body fluid culture w Gram Stain     Status: None (Preliminary result)   Collection Time: 04/10/21 12:12 PM   Specimen: Thoracic  Result Value Ref Range Status   Specimen Description THORACIC  Final   Special Requests LUNG LEFT  Final   Gram Stain   Final    FEW  WBC PRESENT,BOTH PMN AND MONONUCLEAR NO ORGANISMS SEEN RESULT CALLED TO, READ BACK BY AND VERIFIED WITH: P.KOSHWAH AT 1510 ON 04/10/2021 BY T.SAAD. Performed at [facility], [place] [address] [place], [place] [address]    Culture PENDING  Incomplete   Report Status PENDING  Incomplete         Radiology Studies: DG Chest 1 View  Result Date: 04/10/2021 CLINICAL DATA:  Status post left thoracentesis. EXAM: CHEST  1 VIEW COMPARISON:  April 09, 2021. FINDINGS: Stable complete opacification of left hemithorax most likely due to combination of atelectasis and effusion. No definite pneumothorax is noted. IMPRESSION: Stable complete opacification of left hemithorax most likely due to combination of atelectasis and effusion. No definite pneumothorax is noted status post left thoracentesis. Electronically Signed   By: Marijo Conception M.D.   On: 04/10/2021 12:27   CT CHEST W CONTRAST  Result Date: 04/10/2021 CLINICAL DATA:  Pleural effusion, suspected malignancy EXAM: CT CHEST WITH CONTRAST TECHNIQUE: Multidetector CT imaging of the chest was performed during intravenous contrast administration. RADIATION DOSE REDUCTION: This exam was performed according to the departmental dose-optimization program which includes automated exposure control, adjustment of the mA and/or kV according to  patient size and/or use of iterative reconstruction technique. CONTRAST:  63mL OMNIPAQUE IOHEXOL 300 MG/ML  SOLN COMPARISON:  11/17/2020 FINDINGS: Cardiovascular: Atherosclerotic calcifications aorta and coronary arteries. Aorta normal caliber. Vascular structures grossly patent. Heart unremarkable. No pericardial effusion. Mediastinum/Nodes: Esophagus normal appearance. Diffusely heterogeneous thyroid lobes with multiple nodules up to 15 mm diameter; This has been evaluated on previous imaging, thyroid ultrasound 02/14/2021. (Ref: J Am Coll Radiol. 2015 Feb;12(2): 143-50). Base of cervical region otherwise normal  appearance. Few scattered normal size mediastinal lymph nodes without thoracic adenopathy. Lungs/Pleura: Complete opacification of the LEFT hemithorax by large pleural effusion. Complete atelectasis of LEFT lung. No definite pleural thickening or mass. No discrete pulmonary mass identified. Low-attenuation in LEFT lower lobe question drowned lung. Again identified abrupt cut off of air within LEFT mainstem bronchus, may be related to mucous plugging though cannot exclude tumor with this appearance. Mucus within RIGHT mainstem bronchus. Underlying emphysematous changes in RIGHT lung. Subpleural area of pleural thickening versus nodularity posterior RIGHT upper lobe 18 x 7 mm image 46, minimally more prominent than on previous exam. Peribronchial thickening with subsegmental atelectasis in RIGHT middle lobe and RIGHT lower lobe. No RIGHT lung infiltrate, pleural effusion, or pneumothorax. Upper Abdomen: Diffuse thickening of the adrenal glands. Minimal diverticulosis at splenic flexure of colon. Remaining visualized upper abdomen unremarkable. Musculoskeletal: Diffuse osseous demineralization. IMPRESSION: Complete opacification of the LEFT hemithorax by large pleural effusion with complete atelectasis of LEFT lung and question drowned lung appearance of particularly the LEFT lower lobe. Again identified abrupt cut off of air within LEFT mainstem bronchus, may be related to mucous plugging though cannot exclude tumor with this appearance. Subpleural area of pleural thickening versus nodularity posterior RIGHT upper lobe 18 x 7 mm, minimally more prominent than on previous exam; recommend continued attention on follow-up imaging. Diffuse thickening of the adrenal glands bilaterally question adrenal hyperplasia. Coronary arterial calcifications. Diffusely heterogeneous thyroid lobes with multiple nodules up to 15 mm diameter; This has been evaluated on previous imaging, thyroid ultrasound 02/14/2021. Aortic  Atherosclerosis (ICD10-I70.0) and Emphysema (ICD10-J43.9). Electronically Signed   By: Lavonia Dana M.D.   On: 04/10/2021 16:19   IR THORACENTESIS ASP PLEURAL SPACE W/IMG GUIDE  Result Date: 04/10/2021 INDICATION: Patient with history of non-small cell lung cancer, COPD, Parkinson's disease, atrial flutter, left pleural effusion. Request received for diagnostic and therapeutic left thoracentesis. EXAM: ULTRASOUND GUIDED DIAGNOSTIC AND THERAPEUTIC LEFT THORACENTESIS MEDICATIONS: 10 ml 1% lidocaine COMPLICATIONS: None immediate. PROCEDURE: An ultrasound guided thoracentesis was thoroughly discussed with the patient and questions answered. The benefits, risks, alternatives and complications were also discussed. The patient understands and wishes to proceed with the procedure. Written consent was obtained. Ultrasound was performed to localize and mark an adequate pocket of fluid in the left chest. The area was then prepped and draped in the normal sterile fashion. 1% Lidocaine was used for local anesthesia. Under ultrasound guidance a 6 Fr Safe-T-Centesis catheter was introduced. Thoracentesis was performed. The catheter was removed and a dressing applied. FINDINGS: A total of approximately 2 liters of amber/blood-tinged fluid was removed. Samples were sent to the laboratory as requested by the clinical team. IMPRESSION: Successful ultrasound guided diagnostic and therapeutic left thoracentesis yielding 2 liters of pleural fluid. Read by: Rowe Robert, PA-C Electronically Signed   By: Corrie Mckusick D.O.   On: 04/10/2021 13:25        Scheduled Meds:  (feeding supplement) PROSource Plus  30 mL Oral BID BM   acidophilus  1 capsule Oral QODAY  arformoterol  15 mcg Nebulization Q12H   atorvastatin  20 mg Oral QHS   buPROPion  150 mg Oral Daily   Carbidopa-Levodopa ER  1.5 tablet Oral TID   Chlorhexidine Gluconate Cloth  6 each Topical Daily   donepezil  10 mg Oral QHS   famotidine  20 mg Oral Daily    feeding supplement  1 Container Oral TID BM   levalbuterol  0.63 mg Nebulization TID   lidocaine  2 patch Transdermal Daily   metoprolol tartrate  25 mg Oral BID   multivitamin with minerals  1 tablet Oral Daily   pantoprazole  40 mg Oral QPM   potassium chloride  40 mEq Oral Q3H   sertraline  150 mg Oral Daily   sodium chloride flush  10-40 mL Intracatheter Q12H   sodium chloride flush  3 mL Intravenous Q12H   umeclidinium bromide  1 puff Inhalation Daily   Continuous Infusions:  sodium chloride 10 mL/hr at 04/11/21 0529   amiodarone 30 mg/hr (04/11/21 0831)   heparin 1,800 Units/hr (04/11/21 0529)     LOS: 2 days    Time spent: 47 minutes spent on chart review, discussion with nursing staff, consultants, updating family and interview/physical exam; more than 50% of that time was spent in counseling and/or coordination of care.    Lulia Schriner J [place] DO [facility] Available via Epic secure chat 7am-7pm After these hours, please refer to coverage provider listed on [email or web address] 04/11/2021, 10:19 AM

## 2021-04-11 NOTE — Progress Notes (Signed)
Progress Note  Patient Name: James [name] Date of Encounter: 04/11/2021  Primary Cardiologist:   [name], MD   Subjective   Chronically ill appearing and HR is still high.  He is very sleepy.    Inpatient Medications    Scheduled Meds:  (feeding supplement) PROSource Plus  30 mL Oral BID BM   acidophilus  1 capsule Oral QODAY   arformoterol  15 mcg Nebulization Q12H   atorvastatin  20 mg Oral QHS   buPROPion  150 mg Oral Daily   Carbidopa-Levodopa ER  1.5 tablet Oral TID   Chlorhexidine Gluconate Cloth  6 each Topical Daily   donepezil  10 mg Oral QHS   famotidine  20 mg Oral Daily   feeding supplement  1 Container Oral TID BM   levalbuterol  0.63 mg Nebulization TID   lidocaine  2 patch Transdermal Daily   multivitamin with minerals  1 tablet Oral Daily   pantoprazole  40 mg Oral QPM   potassium chloride  40 mEq Oral Q3H   sertraline  150 mg Oral Daily   sodium chloride flush  10-40 mL Intracatheter Q12H   sodium chloride flush  3 mL Intravenous Q12H   umeclidinium bromide  1 puff Inhalation Daily   Continuous Infusions:  sodium chloride 10 mL/hr at 04/11/21 0529   amiodarone 30 mg/hr (04/11/21 0529)   heparin 1,800 Units/hr (04/11/21 0529)   magnesium sulfate bolus IVPB 2 g (04/11/21 0747)   PRN Meds: acetaminophen **OR** acetaminophen, lidocaine-prilocaine, sodium chloride flush   Vital Signs    Vitals:   04/10/21 2015 04/11/21 0052 04/11/21 0100 04/11/21 0414  BP:  119/67 118/67 125/79  Pulse:  (!) 151  (!) 154  Resp:  (!) 24 19 20   Temp:  98.7 F (37.1 C) 98.7 F (37.1 C) 98 F (36.7 C)  TempSrc:  Oral Oral Oral  SpO2: 96% 93%    Weight:      Height:        Intake/Output Summary (Last 24 hours) at 04/11/2021 0751 Last data filed at 04/11/2021 0529 Gross per 24 hour  Intake 1961.82 ml  Output 700 ml  Net 1261.82 ml   Filed Weights   04/09/21 0400  Weight: 85 kg    Telemetry    Atrial flutter.  Rate is decreased most of the  last several hours but otherwise maintaining 2:1  - Personally Reviewed  ECG    NA - Personally Reviewed  Physical Exam   GEN: No  acute distress.  However, frail and chronically ill appearing Neck: No  JVD Cardiac: Irregular RR, no murmurs, rubs, or gallops.  Distant heart sounds Respiratory:   Absent BS left lung decreased right base.  GI: Soft, nontender, non-distended, normal bowel sounds  MS:  No edema; No deformity. Neuro:   Nonfocal  Psych: Somnolent    Labs    Chemistry Recent Labs  Lab 04/09/21 0245 04/10/21 0304 04/11/21 0426  NA 137 138 137  K 3.0* 3.4* 3.2*  CL 96* 97* 96*  CO2 29 30 32  GLUCOSE 94 100* 130*  BUN 14 11 7*  CREATININE 0.87 0.79 0.60*  CALCIUM 8.8* 8.6* 8.1*  PROT 5.1*  --   --   ALBUMIN 1.8*  --   --   AST 14*  --   --   ALT 9  --   --   ALKPHOS 70  --   --   BILITOT 0.2*  --   --  GFRNONAA >60 >60 >60  ANIONGAP 12 11 9      Hematology Recent Labs  Lab 04/09/21 0245 04/10/21 0304 04/11/21 0634  WBC 10.3 7.2 5.2  RBC 3.94* 3.77* 3.74*  HGB 9.7* 9.5* 9.3*  HCT 32.6* 31.1* 30.8*  MCV 82.7 82.5 82.4  MCH 24.6* 25.2* 24.9*  MCHC 29.8* 30.5 30.2  RDW 28.8* 28.7* 28.9*  PLT 216 172 183    Cardiac EnzymesNo results for input(s): TROPONINI in the last 168 hours. No results for input(s): TROPIPOC in the last 168 hours.   BNP Recent Labs  Lab 04/09/21 0246  BNP 177.3*     DDimer No results for input(s): DDIMER in the last 168 hours.   Radiology    DG Chest 1 View  Result Date: 04/10/2021 CLINICAL DATA:  Status post left thoracentesis. EXAM: CHEST  1 VIEW COMPARISON:  April 09, 2021. FINDINGS: Stable complete opacification of left hemithorax most likely due to combination of atelectasis and effusion. No definite pneumothorax is noted. IMPRESSION: Stable complete opacification of left hemithorax most likely due to combination of atelectasis and effusion. No definite pneumothorax is noted status post left thoracentesis.  Electronically Signed   By: Marijo Conception M.D.   On: 04/10/2021 12:27   CT CHEST W CONTRAST  Result Date: 04/10/2021 CLINICAL DATA:  Pleural effusion, suspected malignancy EXAM: CT CHEST WITH CONTRAST TECHNIQUE: Multidetector CT imaging of the chest was performed during intravenous contrast administration. RADIATION DOSE REDUCTION: This exam was performed according to the departmental dose-optimization program which includes automated exposure control, adjustment of the mA and/or kV according to patient size and/or use of iterative reconstruction technique. CONTRAST:  38mL OMNIPAQUE IOHEXOL 300 MG/ML  SOLN COMPARISON:  11/17/2020 FINDINGS: Cardiovascular: Atherosclerotic calcifications aorta and coronary arteries. Aorta normal caliber. Vascular structures grossly patent. Heart unremarkable. No pericardial effusion. Mediastinum/Nodes: Esophagus normal appearance. Diffusely heterogeneous thyroid lobes with multiple nodules up to 15 mm diameter; This has been evaluated on previous imaging, thyroid ultrasound 02/14/2021. (Ref: J Am Coll Radiol. 2015 Feb;12(2): 143-50). Base of cervical region otherwise normal appearance. Few scattered normal size mediastinal lymph nodes without thoracic adenopathy. Lungs/Pleura: Complete opacification of the LEFT hemithorax by large pleural effusion. Complete atelectasis of LEFT lung. No definite pleural thickening or mass. No discrete pulmonary mass identified. Low-attenuation in LEFT lower lobe question drowned lung. Again identified abrupt cut off of air within LEFT mainstem bronchus, may be related to mucous plugging though cannot exclude tumor with this appearance. Mucus within RIGHT mainstem bronchus. Underlying emphysematous changes in RIGHT lung. Subpleural area of pleural thickening versus nodularity posterior RIGHT upper lobe 18 x 7 mm image 46, minimally more prominent than on previous exam. Peribronchial thickening with subsegmental atelectasis in RIGHT middle lobe and  RIGHT lower lobe. No RIGHT lung infiltrate, pleural effusion, or pneumothorax. Upper Abdomen: Diffuse thickening of the adrenal glands. Minimal diverticulosis at splenic flexure of colon. Remaining visualized upper abdomen unremarkable. Musculoskeletal: Diffuse osseous demineralization. IMPRESSION: Complete opacification of the LEFT hemithorax by large pleural effusion with complete atelectasis of LEFT lung and question drowned lung appearance of particularly the LEFT lower lobe. Again identified abrupt cut off of air within LEFT mainstem bronchus, may be related to mucous plugging though cannot exclude tumor with this appearance. Subpleural area of pleural thickening versus nodularity posterior RIGHT upper lobe 18 x 7 mm, minimally more prominent than on previous exam; recommend continued attention on follow-up imaging. Diffuse thickening of the adrenal glands bilaterally question adrenal hyperplasia. Coronary arterial calcifications. Diffusely heterogeneous  thyroid lobes with multiple nodules up to 15 mm diameter; This has been evaluated on previous imaging, thyroid ultrasound 02/14/2021. Aortic Atherosclerosis (ICD10-I70.0) and Emphysema (ICD10-J43.9). Electronically Signed   By: Lavonia Dana M.D.   On: 04/10/2021 16:19   IR THORACENTESIS ASP PLEURAL SPACE W/IMG GUIDE  Result Date: 04/10/2021 INDICATION: Patient with history of non-small cell lung cancer, COPD, Parkinson's disease, atrial flutter, left pleural effusion. Request received for diagnostic and therapeutic left thoracentesis. EXAM: ULTRASOUND GUIDED DIAGNOSTIC AND THERAPEUTIC LEFT THORACENTESIS MEDICATIONS: 10 ml 1% lidocaine COMPLICATIONS: None immediate. PROCEDURE: An ultrasound guided thoracentesis was thoroughly discussed with the patient and questions answered. The benefits, risks, alternatives and complications were also discussed. The patient understands and wishes to proceed with the procedure. Written consent was obtained. Ultrasound was  performed to localize and mark an adequate pocket of fluid in the left chest. The area was then prepped and draped in the normal sterile fashion. 1% Lidocaine was used for local anesthesia. Under ultrasound guidance a 6 Fr Safe-T-Centesis catheter was introduced. Thoracentesis was performed. The catheter was removed and a dressing applied. FINDINGS: A total of approximately 2 liters of amber/blood-tinged fluid was removed. Samples were sent to the laboratory as requested by the clinical team. IMPRESSION: Successful ultrasound guided diagnostic and therapeutic left thoracentesis yielding 2 liters of pleural fluid. Read by: Rowe Robert, PA-C Electronically Signed   By: Corrie Mckusick D.O.   On: 04/10/2021 13:25    Cardiac Studies   ECHO  1. Left ventricular ejection fraction, by estimation, is 55 to 60%. The  left ventricle has normal function. The left ventricle has no regional  wall motion abnormalities. There is mild concentric left ventricular  hypertrophy. Left ventricular diastolic  function could not be evaluated.   2. Right ventricular systolic function is normal. The right ventricular  size is normal. There is normal pulmonary artery systolic pressure.   3. Left atrial size was mild to moderately dilated.   4. Right atrial size was mild to moderately dilated.   5. A small pericardial effusion is present. There is no evidence of  cardiac tamponade.   6. The mitral valve is normal in structure. No evidence of mitral valve  regurgitation. No evidence of mitral stenosis.   7. The aortic valve is grossly normal. Aortic valve regurgitation is not  visualized. No aortic stenosis is present.   8. There is mild dilatation of the ascending aorta, measuring 40 mm.   9. The inferior vena cava is dilated in size with >50% respiratory  variability, suggesting right atrial pressure of 8 mmHg.    Patient Profile     76 y.o. 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 Tamala Julian.    Assessment & Plan    ATRIAL FLUTTER WITH RAPID RATE:     On heparin.  I would suggest resume DOAC at discharge as he does not have signs of active bleeding, is taking POs.    Restarted metoprolol 25 mg bid today.  Continue IV amio today and hopefully I can change to PO in the AM.     LEFT PLEURAL EFFUSION:    Removed 2 liters of fluid.    Plan  per primary team.    CT with continued complete white out of the left lung.   HYPOKALEMIA:  Potassium supplementation ordered.      For questions or updates, please contact [facility] Please consult [email or web address] for contact info under Cardiology/STEMI.   Signed, Minus Breeding, MD  04/11/2021, 7:51 AM

## 2021-04-12 DIAGNOSIS — C3432 Malignant neoplasm of lower lobe, left bronchus or lung: Secondary | ICD-10-CM | POA: Diagnosis not present

## 2021-04-12 DIAGNOSIS — I4892 Unspecified atrial flutter: Secondary | ICD-10-CM | POA: Diagnosis not present

## 2021-04-12 DIAGNOSIS — J439 Emphysema, unspecified: Secondary | ICD-10-CM | POA: Diagnosis not present

## 2021-04-12 DIAGNOSIS — J9611 Chronic respiratory failure with hypoxia: Secondary | ICD-10-CM | POA: Diagnosis not present

## 2021-04-12 LAB — BASIC METABOLIC PANEL
Anion gap: 20 — ABNORMAL HIGH (ref 5–15)
Anion gap: 7 (ref 5–15)
BUN: 6 mg/dL — ABNORMAL LOW (ref 8–23)
BUN: 6 mg/dL — ABNORMAL LOW (ref 8–23)
CO2: 24 mmol/L (ref 22–32)
CO2: 32 mmol/L (ref 22–32)
Calcium: 6.4 mg/dL — CL (ref 8.9–10.3)
Calcium: 8.2 mg/dL — ABNORMAL LOW (ref 8.9–10.3)
Chloride: 77 mmol/L — ABNORMAL LOW (ref 98–111)
Chloride: 98 mmol/L (ref 98–111)
Creatinine, Ser: 0.62 mg/dL (ref 0.61–1.24)
Creatinine, Ser: 0.48 mg/dL — ABNORMAL LOW (ref 0.61–1.24)
GFR, Estimated: >60 mL/min (ref >60)
GFR, Estimated: >60 mL/min (ref >60)
Glucose, Bld: 934 mg/dL (ref 70–99)
Glucose, Bld: 131 mg/dL — ABNORMAL HIGH (ref 70–99)
Potassium: 2.8 mmol/L — ABNORMAL LOW (ref 3.5–5.1)
Potassium: 3.5 mmol/L (ref 3.5–5.1)
Sodium: 121 mmol/L — ABNORMAL LOW (ref 135–145)
Sodium: 137 mmol/L (ref 135–145)

## 2021-04-12 LAB — CBC
HCT: 29.6 % — ABNORMAL LOW (ref 39.0–52.0)
Hemoglobin: 8.8 g/dL — ABNORMAL LOW (ref 13.0–17.0)
MCH: 24.9 pg — ABNORMAL LOW (ref 26.0–34.0)
MCHC: 29.7 g/dL — ABNORMAL LOW (ref 30.0–36.0)
MCV: 83.6 fL (ref 80.0–100.0)
Platelets: 198 10*3/uL (ref 150–400)
RBC: 3.54 MIL/uL — ABNORMAL LOW (ref 4.22–5.81)
RDW: 28.7 % — ABNORMAL HIGH (ref 11.5–15.5)
WBC: 4.5 10*3/uL (ref 4.0–10.5)
nRBC: 0 % (ref 0.0–0.2)

## 2021-04-12 LAB — MAGNESIUM
Magnesium: 1.4 mg/dL — ABNORMAL LOW (ref 1.7–2.4)
Magnesium: 1.7 mg/dL (ref 1.7–2.4)

## 2021-04-12 LAB — HEPARIN LEVEL (UNFRACTIONATED): Heparin Unfractionated: 0.33 IU/mL (ref 0.30–0.70)

## 2021-04-12 MED ORDER — LOPERAMIDE HCL 2 MG PO CAPS: 2.0000 mg | ORAL_CAPSULE | ORAL | Status: DC | PRN

## 2021-04-12 MED ORDER — POTASSIUM CHLORIDE CRYS ER 20 MEQ PO TBCR
40.0000 meq | EXTENDED_RELEASE_TABLET | Freq: Once | ORAL | Status: AC
  Administered 2021-04-12: 40 meq via ORAL
  Filled 2021-04-12: qty 2

## 2021-04-12 MED ORDER — APIXABAN 5 MG PO TABS
5.0000 mg | ORAL_TABLET | Freq: Two times a day (BID) | ORAL | Status: DC
  Administered 2021-04-12 – 2021-04-15 (×7): 5 mg via ORAL
  Filled 2021-04-12 (×7): qty 1

## 2021-04-12 MED ORDER — METOPROLOL TARTRATE 25 MG PO TABS
25.0000 mg | ORAL_TABLET | Freq: Four times a day (QID) | ORAL | Status: DC
  Administered 2021-04-12 – 2021-04-14 (×7): 25 mg via ORAL
  Filled 2021-04-12 (×7): qty 1

## 2021-04-12 MED ORDER — MAGNESIUM SULFATE 2 GM/50ML IV SOLN
2.0000 g | Freq: Once | INTRAVENOUS | Status: AC
  Administered 2021-04-12: 2 g via INTRAVENOUS
  Filled 2021-04-12: qty 50

## 2021-04-12 MED ORDER — POTASSIUM CHLORIDE CRYS ER 20 MEQ PO TBCR: 40.0000 meq | EXTENDED_RELEASE_TABLET | ORAL | Status: DC

## 2021-04-12 NOTE — Care Management Important Message (Signed)
Important Message  Patient Details  Name: Audel Coakley MRN: [id number] Date of Birth: [birth year]   Medicare Important Message Given:  Yes     [name] 04/12/2021, 9:44 AM

## 2021-04-12 NOTE — Progress Notes (Signed)
Brief Palliative Medicine Progress Note:  10:30 AM Chart review performed. Noted that [facility] has sent another outpatient Palliative Care referral to [facility]. Discussed with Christena Flake - she will cancel [facility] referral that was previously placed on 04/09/21.  Discussed case with [facility] liaison - requested outpatient Palliative Care provider see patient sooner than later after discharge because of his high risk for decline/possibility they will be ready for hospice in the near future.   5:10 PM Provided updates to Dr. [place] from [place] discussion on 04/09/21.  Thank you for allowing [facility] to assist in the care of this patient.  Farha Dano M. Tamala Julian, FNP-BC Palliative Medicine Team Team Phone: [phone] 15 minutes  Greater than 50%  of this time was spent counseling and coordinating care related to the above assessment and plan.

## 2021-04-12 NOTE — Progress Notes (Signed)
[facility]  Received request for [facility] palliative services at discharge as well as possible interest in hospice services. This liaison reached out to spouse [name] and was unable to get in touch with her. Voicemail was left to return call if interested in knowing more about our hospice services. Otherwise we will follow up at discharge for palliative services at [facility].   Thank you, Clementeen Hoof, BSN, [facility] [phone]

## 2021-04-12 NOTE — Progress Notes (Signed)
Progress Note  Patient Name: James [name] Date of Encounter: 04/12/2021  Primary Cardiologist:   [name], MD   Subjective   He says that he is not breathing better post thoracentesis.  Not describing pain.  Fatigued  Inpatient Medications    Scheduled Meds:  (feeding supplement) PROSource Plus  30 mL Oral BID BM   acidophilus  1 capsule Oral QODAY   arformoterol  15 mcg Nebulization Q12H   atorvastatin  20 mg Oral QHS   buPROPion  150 mg Oral Daily   Carbidopa-Levodopa ER  1.5 tablet Oral TID   Chlorhexidine Gluconate Cloth  6 each Topical Daily   donepezil  10 mg Oral QHS   famotidine  20 mg Oral Daily   feeding supplement  1 Container Oral TID BM   levalbuterol  0.63 mg Nebulization BID   lidocaine  2 patch Transdermal Daily   metoprolol tartrate  25 mg Oral BID   multivitamin with minerals  1 tablet Oral Daily   pantoprazole  40 mg Oral QPM   potassium chloride  40 mEq Oral Once   sertraline  150 mg Oral Daily   sodium chloride flush  10-40 mL Intracatheter Q12H   sodium chloride flush  3 mL Intravenous Q12H   umeclidinium bromide  1 puff Inhalation Daily   Continuous Infusions:  sodium chloride 10 mL/hr at 04/11/21 0529   amiodarone 30 mg/hr (04/11/21 2213)   heparin 2,200 Units/hr (04/12/21 0406)   magnesium sulfate bolus IVPB     PRN Meds: acetaminophen **OR** acetaminophen, lidocaine-prilocaine, sodium chloride flush   Vital Signs    Vitals:   04/11/21 2011 04/11/21 2307 04/12/21 0003 04/12/21 0551  BP:  103/77 (!) 100/57 132/62  Pulse:  (!) 155 (!) 52 (!) 53  Resp:   20 (!) 27  Temp:   98.6 F (37 C) 97.7 F (36.5 C)  TempSrc:   Oral Oral  SpO2: 95%  98% 98%  Weight:      Height:        Intake/Output Summary (Last 24 hours) at 04/12/2021 0757 Last data filed at 04/12/2021 0551 Gross per 24 hour  Intake 1372.61 ml  Output 300 ml  Net 1072.61 ml   Filed Weights   04/09/21 0400  Weight: 85 kg    Telemetry    Atrial  flutter with variable rate and at times 2:1  - Personally Reviewed  ECG    NA - Personally Reviewed  Physical Exam    GEN: No acute distress.  Chronically ill appearing and frail Neck: No  JVD Cardiac: Irregular RR, no murmurs, rubs, or gallops. Distant heart sounds Respiratory:      Absent breath sounds on the left and decreased on the right GI: Soft, nontender, non-distended, normal bowel sounds  MS:  No edema; No deformity. Neuro:   Nonfocal  Psych: Oriented and appropriate    Labs    Chemistry Recent Labs  Lab 04/09/21 0245 04/10/21 0304 04/11/21 0426 04/12/21 0555 04/12/21 0659  NA 137   < > 137 121* 137  K 3.0*   < > 3.2* 2.8* 3.5  CL 96*   < > 96* 77* 98  CO2 29   < > 32 24 32  GLUCOSE 94   < > 130* 934* 131*  BUN 14   < > 7* 6* 6*  CREATININE 0.87   < > 0.60* 0.62 0.48*  CALCIUM 8.8*   < > 8.1* 6.4* 8.2*  PROT  5.1*  --   --   --   --   ALBUMIN 1.8*  --   --   --   --   AST 14*  --   --   --   --   ALT 9  --   --   --   --   ALKPHOS 70  --   --   --   --   BILITOT 0.2*  --   --   --   --   GFRNONAA >60   < > >60 >60 >60  ANIONGAP 12   < > 9 20* 7   < > = values in this interval not displayed.     Hematology Recent Labs  Lab 04/10/21 0304 04/11/21 0634 04/12/21 0555  WBC 7.2 5.2 4.5  RBC 3.77* 3.74* 3.54*  HGB 9.5* 9.3* 8.8*  HCT 31.1* 30.8* 29.6*  MCV 82.5 82.4 83.6  MCH 25.2* 24.9* 24.9*  MCHC 30.5 30.2 29.7*  RDW 28.7* 28.9* 28.7*  PLT 172 183 198    Cardiac EnzymesNo results for input(s): TROPONINI in the last 168 hours. No results for input(s): TROPIPOC in the last 168 hours.   BNP Recent Labs  Lab 04/09/21 0246  BNP 177.3*     DDimer No results for input(s): DDIMER in the last 168 hours.   Radiology    DG Chest 1 View  Result Date: 04/10/2021 CLINICAL DATA:  Status post left thoracentesis. EXAM: CHEST  1 VIEW COMPARISON:  April 09, 2021. FINDINGS: Stable complete opacification of left hemithorax most likely due to  combination of atelectasis and effusion. No definite pneumothorax is noted. IMPRESSION: Stable complete opacification of left hemithorax most likely due to combination of atelectasis and effusion. No definite pneumothorax is noted status post left thoracentesis. Electronically Signed   By: Marijo Conception M.D.   On: 04/10/2021 12:27   CT CHEST W CONTRAST  Result Date: 04/10/2021 CLINICAL DATA:  Pleural effusion, suspected malignancy EXAM: CT CHEST WITH CONTRAST TECHNIQUE: Multidetector CT imaging of the chest was performed during intravenous contrast administration. RADIATION DOSE REDUCTION: This exam was performed according to the departmental dose-optimization program which includes automated exposure control, adjustment of the mA and/or kV according to patient size and/or use of iterative reconstruction technique. CONTRAST:  77mL OMNIPAQUE IOHEXOL 300 MG/ML  SOLN COMPARISON:  11/17/2020 FINDINGS: Cardiovascular: Atherosclerotic calcifications aorta and coronary arteries. Aorta normal caliber. Vascular structures grossly patent. Heart unremarkable. No pericardial effusion. Mediastinum/Nodes: Esophagus normal appearance. Diffusely heterogeneous thyroid lobes with multiple nodules up to 15 mm diameter; This has been evaluated on previous imaging, thyroid ultrasound 02/14/2021. (Ref: J Am Coll Radiol. 2015 Feb;12(2): 143-50). Base of cervical region otherwise normal appearance. Few scattered normal size mediastinal lymph nodes without thoracic adenopathy. Lungs/Pleura: Complete opacification of the LEFT hemithorax by large pleural effusion. Complete atelectasis of LEFT lung. No definite pleural thickening or mass. No discrete pulmonary mass identified. Low-attenuation in LEFT lower lobe question drowned lung. Again identified abrupt cut off of air within LEFT mainstem bronchus, may be related to mucous plugging though cannot exclude tumor with this appearance. Mucus within RIGHT mainstem bronchus. Underlying  emphysematous changes in RIGHT lung. Subpleural area of pleural thickening versus nodularity posterior RIGHT upper lobe 18 x 7 mm image 46, minimally more prominent than on previous exam. Peribronchial thickening with subsegmental atelectasis in RIGHT middle lobe and RIGHT lower lobe. No RIGHT lung infiltrate, pleural effusion, or pneumothorax. Upper Abdomen: Diffuse thickening of the adrenal glands. Minimal  diverticulosis at splenic flexure of colon. Remaining visualized upper abdomen unremarkable. Musculoskeletal: Diffuse osseous demineralization. IMPRESSION: Complete opacification of the LEFT hemithorax by large pleural effusion with complete atelectasis of LEFT lung and question drowned lung appearance of particularly the LEFT lower lobe. Again identified abrupt cut off of air within LEFT mainstem bronchus, may be related to mucous plugging though cannot exclude tumor with this appearance. Subpleural area of pleural thickening versus nodularity posterior RIGHT upper lobe 18 x 7 mm, minimally more prominent than on previous exam; recommend continued attention on follow-up imaging. Diffuse thickening of the adrenal glands bilaterally question adrenal hyperplasia. Coronary arterial calcifications. Diffusely heterogeneous thyroid lobes with multiple nodules up to 15 mm diameter; This has been evaluated on previous imaging, thyroid ultrasound 02/14/2021. Aortic Atherosclerosis (ICD10-I70.0) and Emphysema (ICD10-J43.9). Electronically Signed   By: Lavonia Dana M.D.   On: 04/10/2021 16:19   IR THORACENTESIS ASP PLEURAL SPACE W/IMG GUIDE  Result Date: 04/10/2021 INDICATION: Patient with history of non-small cell lung cancer, COPD, Parkinson's disease, atrial flutter, left pleural effusion. Request received for diagnostic and therapeutic left thoracentesis. EXAM: ULTRASOUND GUIDED DIAGNOSTIC AND THERAPEUTIC LEFT THORACENTESIS MEDICATIONS: 10 ml 1% lidocaine COMPLICATIONS: None immediate. PROCEDURE: An ultrasound  guided thoracentesis was thoroughly discussed with the patient and questions answered. The benefits, risks, alternatives and complications were also discussed. The patient understands and wishes to proceed with the procedure. Written consent was obtained. Ultrasound was performed to localize and mark an adequate pocket of fluid in the left chest. The area was then prepped and draped in the normal sterile fashion. 1% Lidocaine was used for local anesthesia. Under ultrasound guidance a 6 Fr Safe-T-Centesis catheter was introduced. Thoracentesis was performed. The catheter was removed and a dressing applied. FINDINGS: A total of approximately 2 liters of amber/blood-tinged fluid was removed. Samples were sent to the laboratory as requested by the clinical team. IMPRESSION: Successful ultrasound guided diagnostic and therapeutic left thoracentesis yielding 2 liters of pleural fluid. Read by: Rowe Robert, PA-C Electronically Signed   By: Corrie Mckusick D.O.   On: 04/10/2021 13:25    Cardiac Studies   ECHO  1. Left ventricular ejection fraction, by estimation, is 55 to 60%. The  left ventricle has normal function. The left ventricle has no regional  wall motion abnormalities. There is mild concentric left ventricular  hypertrophy. Left ventricular diastolic  function could not be evaluated.   2. Right ventricular systolic function is normal. The right ventricular  size is normal. There is normal pulmonary artery systolic pressure.   3. Left atrial size was mild to moderately dilated.   4. Right atrial size was mild to moderately dilated.   5. A small pericardial effusion is present. There is no evidence of  cardiac tamponade.   6. The mitral valve is normal in structure. No evidence of mitral valve  regurgitation. No evidence of mitral stenosis.   7. The aortic valve is grossly normal. Aortic valve regurgitation is not  visualized. No aortic stenosis is present.   8. There is mild dilatation of the  ascending aorta, measuring 40 mm.   9. The inferior vena cava is dilated in size with >50% respiratory  variability, suggesting right atrial pressure of 8 mmHg.    Patient Profile     76 y.o. 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 Tamala Julian.    Assessment & Plan    ATRIAL FLUTTER WITH RAPID RATE:     On heparin.  I would resume DOAC per the primary team if taking POs unless goals of therapy are full comfort care only.  Started PO metoprolol yesterday.   I am going to change to qid metoprolol with hold orders.  Need to look for a window of opportunity to transition either off of amio or on to PO amion if rate allows.    LEFT PLEURAL EFFUSION:    Removed 2 liters of fluid.    Pulmonary consulted and comfort care is suggested.    For questions or updates, please contact [place] Please consult [email or web address] for contact info under Cardiology/STEMI.   Signed, Minus Breeding, MD  04/12/2021, 7:57 AM

## 2021-04-12 NOTE — Progress Notes (Addendum)
ANTICOAGULATION CONSULT NOTE   Pharmacy Consult for heparin Indication: atrial fibrillation  Allergies  Allergen Reactions   Aspirin Other (See Comments)    GI bleed   Codeine Itching   Morphine Itching and Nausea Only    Patient Measurements: Height: 6\' 1"  (185.4 cm) Weight: 85 kg (187 lb 6.3 oz) IBW/kg (Calculated) : 79.9 Heparin Dosing Weight: 85 kg  Vital Signs: Temp: 97.7 F (36.5 C) (02/17 0551) Temp Source: Oral (02/17 0551) BP: 132/62 (02/17 0551) Pulse Rate: 53 (02/17 0551)  Labs: Recent Labs    04/09/21 2028 04/09/21 2028 04/10/21 0304 04/11/21 0426 04/11/21 1610 04/11/21 1206 04/11/21 2100 04/12/21 0555  HGB  --    < > 9.5*  --  9.3*  --   --  8.8*  HCT  --   --  31.1*  --  30.8*  --   --  29.6*  PLT  --   --  172  --  183  --   --  198  APTT 54*  --  68* 73*  --   --   --   --   HEPARINUNFRC 0.46  --  0.36 <0.10*  --  0.11* 0.22* 0.33  CREATININE  --   --  0.79 0.60*  --   --   --  0.62   < > = values in this interval not displayed.     Estimated Creatinine Clearance: 90.2 mL/min (by C-G formula based on SCr of 0.62 mg/dL).   Assessment: 74 YOM presenting with weakness and tachycardia, hx of afib on Eliquis PTA with last dose 2/13 @2330 , chronic anemia stable, plts 216.  Heparin level is therapeutic at 0.33, on 2200 units/hr. Hgb 8.8, plt 198. No s/sx of bleeding or infusion issues.  Goal of Therapy:  Heparin level 0.3-0.7 units/ml Monitor platelets by anticoagulation protocol: Yes   Plan:  Continue heparin gtt at 2200 units / hr Monitor CBC, aPTT/HL, and s/sx of bleeding daily F/u plan to change back to apixaban before/at discharge   Thank you Antonietta Jewel, PharmD, [place] Pharmacist  Phone: [phone] 04/12/2021 7:21 AM  Please check AMION for all [place] phone numbers After 10:00 PM, call [place] [phone]  ADDENDUM Transition to apixaban from heparin - qualifies for 5 mg BID given age<80, wt>60 kg, and Scr<1.5.  Will discontinue heparin infusion and start apixaban.   Antonietta Jewel, PharmD, [facility] Clinical Pharmacist

## 2021-04-12 NOTE — Progress Notes (Signed)
PROGRESS NOTE    James [name]  EGB:[id number] DOB: 20-May-1945 DOA: 04/09/2021 PCP: [name], MD    Brief Narrative:  James [name]  is a 76 y.o. male with past medical history significant for stage IIIa non-small cell lung cancer, recently completed radiation (follows with Dr. Julien Nordmann, chemotherapy held due to functional decline), COPD, chronic respiratory failure with hypoxia (on 4 L supplemental oxygen via nasal cannula at home), paroxysmal atrial flutter on Eliquis, Parkinson's disease, diabetes mellitus type 2, hypertension, hyperlipidemia, iron deficiency anemia, and depression who presented to [facility] ED from [facility] due to severe tachycardia.  Patient reports that he has been having increased shortness of breath.  He had been at the nursing facility for the last month and notes that is also around the time that he stopped receiving chemotherapy treatments.  Over the last 3 months he reports that he is lost approximately 50 pounds. Patient had been on Ceftin since 2/8.   In route with EMS heart rate was noted to be in the 150s.  In the ED patient was noted to be afebrile, heart rates elevated into the 150s in atrial flutter, respirations elevated to 24, blood pressure soft with maps greater than 65, and O2 saturations otherwise maintained on 4 L.  Labs significant for hemoglobin 9.7, potassium 3 albumin 1.8, BNP 177.3.  Chest x-ray noted complete opacification of the left lung possibly representing moderate to large pleural effusion with atelectasis/infiltrate, or possibly postobstructive pneumonitis.  He had initially been given 500 mL of normal saline IV fluid bolus and started on Cardizem drip.  Hospitalist service consulted for further evaluation and management of atrial flutter with RVR.     Assessment & Plan:   Assessment and Plan: * Atrial flutter with rapid ventricular response ([place])- (present on admission) Patient presented after being found to be in atrial flutter  with heart rates into the 150s.  Once in the ED patient was started on a Cardizem drip with some improvement in heart rates.CHA2DS2-VASc score = 5.  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  2022 with LVEF 55-60%, mild concentric LVH, LA/RA moderately dilated, small pericardial effusion without tamponade, mild dilation ascending aorta measuring 40 mm, IVC dilated.  TSH 1.111 on 02/18/2021. --Cardiology following, appreciate assistance --Continue amiodarone drip (on cardizem 300mg  daily outpt) --metoprolol tartrate increased to 25 mg p.o. q6h today --/On heparin drip back to Eliquis 5 mg PO BID today --Continue to monitor on telemetry; A-fib remains poorly controlled  Pleural effusion- (present on admission) Patient presented with opacification of the left lung field.  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 s/p IR left thoracentesis 2/15 with removal 2 L amber/blood-tinged fluid with repeat chest x-ray thereafter showing continued complete opacification of the left hemithorax.  CT chest with contrast 2/15 with complete opacification left hemithorax with abrupt cut off left mainstem bronchus concerning for mucous plug versus tumor invasion.  Pleural fluid studies consistent with exudative effusion and cytology with no malignant cells, although finding malignant cells and pleural fluid is low yield.  Pulmonology was consulted for consideration of bronchoscopy, given his progressive malignancy, poor health recommended supportive care and comfort measures. --Awaiting palliative care consultation  Primary cancer of left lower lobe of lung ([place])- (present on admission) Follows with medical oncology outpatient, Dr. Julien Nordmann.  Despite thoracentesis, patient continues with persistent left sided opacification concerning for worsening/progressive malignancy.  Chemotherapy has been on  hold due to functional decline per oncology notes. --Partial code with DNI --Palliative care consulted for assistance with goals of care/medical decision making; likely poor prognosis and hospice candidate.  COPD with emphysema ([place])- (present on admission) Home regimen includes albuterol MDI as needed, Spiriva, formoterol neb twice daily, Xopenex neb 3 times daily as needed. --Brovana neb every 12 hours --Incruse Ellipta 1 puff daily --Continue home oxygen, 4 L per nasal cannula  Chronic respiratory failure with hypoxia ([place])- (present on admission) Patient noted to have O2 saturations maintained on home 4 L of oxygen. --Continue monitor SPO2, maintain SPO2 greater than 90%  Hypokalemia- (present on admission) Potassium 3.2 this morning, will replete. (goal K >/= 4.0 and Mag >/= 2.0) --Repeat electrolytes in a.m.  Hyperlipidemia associated with type 2 diabetes mellitus ([place])- (present on admission) Patient has known history of diabetes, but is well controlled last hemoglobin A1c was 5 on 2/5.  Admission glucose 94.Marland Kitchen  Home medication regimen includes metformin 500 mg twice daily and atorvastatin 20 mg nightly. --Continue atorvastatin 20 mg p.o. daily --Hold metformin while inpatient --Continue monitor glucose levels, if elevated, will start sliding scale insulin  Iron deficiency anemia- (present on admission) Chronic.  Hemoglobin stable and at baseline. --Continue to monitor  Parkinson's disease ([place])- (present on admission) --Continue Sinemet CR 25-100mg  1.5 tablets TID --Donepezil 10 mg p.o. nightly  Protein calorie malnutrition ([place])- (present on admission) Body mass index is 24.72 kg/m. On admission albumin was noted to be low at 1.8. Nutrition Status: Nutrition Problem: Severe Malnutrition Etiology: chronic illness (stage IIIa non-small cell lung cancer) Signs/Symptoms: severe muscle depletion, energy intake < 75% for > or equal to 1 month, percent weight loss Percent  weight loss: 6 % (in one month) Interventions: Boost Breeze, Liberalize Diet, MVI, Prostat -- Dietitian following, encourage increased oral intake, supplementation  Depression- (present on admission) --Wellbutrin 150 mg p.o. daily --Sertraline 150 mg p.o. daily  Hypomagnesemia- (present on admission) Magnesium 1.9 this morning -- Repeat magnesium level in a.m.  GERD (gastroesophageal reflux disease)- (present on admission) --Pepcid 20 mg p.o. daily --Protonix 40 mg p.o. daily     DVT prophylaxis:   Heparin dripapixaban (ELIQUIS) tablet 5 mg apixaban (ELIQUIS) tablet 5 mg    Code Status: Partial Code Family Communication: Updated spouse present at bedside this morning  Disposition Plan:  Level of care: Progressive Status is: Inpatient Remains inpatient appropriate because: Remains on amiodarone gtt, cardiology increasing p.o. metoprolol today, pulmonology recommended comfort measures given his progressive decline and overall poor prognosis, pending palliative care consultation.  Anticipate discharge back to [facility] with palliative care to follow versus hospice once cardiology signs off and heart rate better controlled.  consultation.  Consultants:  Cardiology Palliative care PCCM  Procedures:  IR thoracentesis, left 2/15; 2 L removed  Antimicrobials:  None   Subjective: Patient seen examined at bedside, resting comfortably.  Sleeping but arousable.  No family present.  Heart rate remains poorly controlled on telemetry.  Cardiology increasing metoprolol.  Seen by pulmonology yesterday, given his poor overall health, advanced malignancy no indication for bronchoscopy and recommended supportive measures/comfort care at this time.  Patient reports that his breathing is stable but over all feels weak and fatigued.  No other questions or concerns at this time.  Patient denies headache, no fever/chills, no nausea/vomiting/diarrhea, no chest pain, no palpitations, no  abdominal pain, no fatigue, no paresthesias.  No acute events overnight per nursing staff  Objective: Vitals:   04/12/21 0003 04/12/21 9379  04/12/21 0825 04/12/21 1138  BP: (!) 100/57 132/62 108/88 118/76  Pulse: (!) 52 (!) 53 (!) 151 (!) 152  Resp: 20 (!) 27 20 (!) 22  Temp: 98.6 F (37 C) 97.7 F (36.5 C)  98.6 F (37 C)  TempSrc: Oral Oral  Oral  SpO2: 98% 98% 96% 96%  Weight:      Height:        Intake/Output Summary (Last 24 hours) at 04/12/2021 1212 Last data filed at 04/12/2021 0551 Gross per 24 hour  Intake 865.61 ml  Output 300 ml  Net 565.61 ml   Filed Weights   04/09/21 0400  Weight: 85 kg    Examination:  Physical Exam: GEN: NAD, alert and oriented x 3, chronically ill/cachectic in appearance, appears older than stated age HEENT: NCAT, PERRL, EOMI, sclera clear, MMM PULM: Breath sounds absent left hemithorax, breath sounds right base slightly decreased, no crackles/wheezing, normal respiratory effort on 4 L nasal cannula which is his baseline CV: Tachycardic, irregularly irregular rhythm w/o M/G/R GI: abd soft, NTND, NABS, no R/G/M MSK: no peripheral edema, muscle strength globally intact 5/5 bilateral upper/lower extremities NEURO: CN II-XII intact, no focal deficits, sensation to light touch intact PSYCH: normal mood/affect Integumentary: dry/intact, no rashes or wounds    Data Reviewed: I have personally reviewed following labs and imaging studies  CBC: Recent Labs  Lab 04/09/21 0245 04/10/21 0304 04/11/21 0634 04/12/21 0555  WBC 10.3 7.2 5.2 4.5  HGB 9.7* 9.5* 9.3* 8.8*  HCT 32.6* 31.1* 30.8* 29.6*  MCV 82.7 82.5 82.4 83.6  PLT 216 172 183 867   Basic Metabolic Panel: Recent Labs  Lab 04/09/21 0245 04/09/21 0850 04/10/21 0304 04/11/21 0426 04/12/21 0555 04/12/21 0659  NA 137  --  138 137 121* 137  K 3.0*  --  3.4* 3.2* 2.8* 3.5  CL 96*  --  97* 96* 77* 98  CO2 29  --  30 32 24 32  GLUCOSE 94  --  100* 130* 934* 131*  BUN 14  --   11 7* 6* 6*  CREATININE 0.87  --  0.79 0.60* 0.62 0.48*  CALCIUM 8.8*  --  8.6* 8.1* 6.4* 8.2*  MG  --  1.5* 1.7 1.7 1.4* 1.7   GFR: Estimated Creatinine Clearance: 90.2 mL/min (A) (by C-G formula based on SCr of 0.48 mg/dL (L)). Liver Function Tests: Recent Labs  Lab 04/09/21 0245  AST 14*  ALT 9  ALKPHOS 70  BILITOT 0.2*  PROT 5.1*  ALBUMIN 1.8*   No results for input(s): LIPASE, AMYLASE in the last 168 hours. No results for input(s): AMMONIA in the last 168 hours. Coagulation Profile: Recent Labs  Lab 04/09/21 0245  INR 2.6*   Cardiac Enzymes: No results for input(s): CKTOTAL, CKMB, CKMBINDEX, TROPONINI in the last 168 hours. BNP (last 3 results) No results for input(s): PROBNP in the last 8760 hours. HbA1C: No results for input(s): HGBA1C in the last 72 hours. CBG: No results for input(s): GLUCAP in the last 168 hours. Lipid Profile: No results for input(s): CHOL, HDL, LDLCALC, TRIG, CHOLHDL, LDLDIRECT in the last 72 hours. Thyroid Function Tests: No results for input(s): TSH, T4TOTAL, FREET4, T3FREE, THYROIDAB in the last 72 hours. Anemia Panel: No results for input(s): VITAMINB12, FOLATE, FERRITIN, TIBC, IRON, RETICCTPCT in the last 72 hours. Sepsis Labs: Recent Labs  Lab 04/09/21 0850  PROCALCITON 0.10    Recent Results (from the past 240 hour(s))  Resp Panel by RT-PCR (Flu A&B, Covid) Nasopharyngeal  Swab     Status: None   Collection Time: 04/02/21  2:48 PM   Specimen: Nasopharyngeal Swab; Nasopharyngeal(NP) swabs in vial transport medium  Result Value Ref Range Status   SARS Coronavirus 2 by RT PCR NEGATIVE NEGATIVE Final    Comment: (NOTE) SARS-CoV-2 target nucleic acids are NOT DETECTED.  The SARS-CoV-2 RNA is generally detectable in upper respiratory specimens during the acute phase of infection. The lowest concentration of SARS-CoV-2 viral copies this assay can detect is 138 copies/mL. A negative result does not preclude SARS-Cov-2 infection  and should not be used as the sole basis for treatment or other patient management decisions. A negative result may occur with  improper specimen collection/handling, submission of specimen other than nasopharyngeal swab, presence of viral mutation(s) within the areas targeted by this assay, and inadequate number of viral copies(<138 copies/mL). A negative result must be combined with clinical observations, patient history, and epidemiological information. The expected result is Negative.  Fact Sheet for Patients:  EntrepreneurPulse.com.au  Fact Sheet for Healthcare Providers:  IncredibleEmployment.be  This test is no t yet approved or cleared by the [place] FDA and  has been authorized for detection and/or diagnosis of SARS-CoV-2 by FDA under an Emergency Use Authorization (EUA). This EUA will remain  in effect (meaning this test can be used) for the duration of the COVID-19 declaration under Section 564(b)(1) of the Act, 21 U.S.C.section 360bbb-3(b)(1), unless the authorization is terminated  or revoked sooner.       Influenza A by PCR NEGATIVE NEGATIVE Final   Influenza B by PCR NEGATIVE NEGATIVE Final    Comment: (NOTE) The Xpert Xpress SARS-CoV-2/FLU/RSV plus assay is intended as an aid in the diagnosis of influenza from Nasopharyngeal swab specimens and should not be used as a sole basis for treatment. Nasal washings and aspirates are unacceptable for Xpert Xpress SARS-CoV-2/FLU/RSV testing.  Fact Sheet for Patients: EntrepreneurPulse.com.au  Fact Sheet for Healthcare Providers: IncredibleEmployment.be  This test is not yet approved or cleared by the [place] FDA and has been authorized for detection and/or diagnosis of SARS-CoV-2 by FDA under an Emergency Use Authorization (EUA). This EUA will remain in effect (meaning this test can be used) for the duration of the COVID-19 declaration  under Section 564(b)(1) of the Act, 21 U.S.C. section 360bbb-3(b)(1), unless the authorization is terminated or revoked.  Performed at [facility], [place] [address], [place], [place] [address]   Body fluid culture w Gram Stain     Status: None (Preliminary result)   Collection Time: 04/10/21 12:12 PM   Specimen: Thoracic  Result Value Ref Range Status   Specimen Description THORACIC  Final   Special Requests LUNG LEFT  Final   Gram Stain   Final    FEW WBC PRESENT,BOTH PMN AND MONONUCLEAR NO ORGANISMS SEEN RESULT CALLED TO, READ BACK BY AND VERIFIED WITH: P.KOSHWAH AT 1510 ON 04/10/2021 BY T.SAAD.    Culture   Final    NO GROWTH 2 DAYS Performed at [facility], [place] [address], [place], [place] [address]    Report Status PENDING  Incomplete         Radiology Studies: DG Chest 1 View  Result Date: 04/10/2021 CLINICAL DATA:  Status post left thoracentesis. EXAM: CHEST  1 VIEW COMPARISON:  April 09, 2021. FINDINGS: Stable complete opacification of left hemithorax most likely due to combination of atelectasis and effusion. No definite pneumothorax is noted. IMPRESSION: Stable complete opacification of left hemithorax most likely  due to combination of atelectasis and effusion. No definite pneumothorax is noted status post left thoracentesis. Electronically Signed   By: Marijo Conception M.D.   On: 04/10/2021 12:27   CT CHEST W CONTRAST  Result Date: 04/10/2021 CLINICAL DATA:  Pleural effusion, suspected malignancy EXAM: CT CHEST WITH CONTRAST TECHNIQUE: Multidetector CT imaging of the chest was performed during intravenous contrast administration. RADIATION DOSE REDUCTION: This exam was performed according to the departmental dose-optimization program which includes automated exposure control, adjustment of the mA and/or kV according to patient size and/or use of iterative reconstruction technique. CONTRAST:  21mL OMNIPAQUE IOHEXOL 300 MG/ML  SOLN COMPARISON:   11/17/2020 FINDINGS: Cardiovascular: Atherosclerotic calcifications aorta and coronary arteries. Aorta normal caliber. Vascular structures grossly patent. Heart unremarkable. No pericardial effusion. Mediastinum/Nodes: Esophagus normal appearance. Diffusely heterogeneous thyroid lobes with multiple nodules up to 15 mm diameter; This has been evaluated on previous imaging, thyroid ultrasound 02/14/2021. (Ref: J Am Coll Radiol. 2015 Feb;12(2): 143-50). Base of cervical region otherwise normal appearance. Few scattered normal size mediastinal lymph nodes without thoracic adenopathy. Lungs/Pleura: Complete opacification of the LEFT hemithorax by large pleural effusion. Complete atelectasis of LEFT lung. No definite pleural thickening or mass. No discrete pulmonary mass identified. Low-attenuation in LEFT lower lobe question drowned lung. Again identified abrupt cut off of air within LEFT mainstem bronchus, may be related to mucous plugging though cannot exclude tumor with this appearance. Mucus within RIGHT mainstem bronchus. Underlying emphysematous changes in RIGHT lung. Subpleural area of pleural thickening versus nodularity posterior RIGHT upper lobe 18 x 7 mm image 46, minimally more prominent than on previous exam. Peribronchial thickening with subsegmental atelectasis in RIGHT middle lobe and RIGHT lower lobe. No RIGHT lung infiltrate, pleural effusion, or pneumothorax. Upper Abdomen: Diffuse thickening of the adrenal glands. Minimal diverticulosis at splenic flexure of colon. Remaining visualized upper abdomen unremarkable. Musculoskeletal: Diffuse osseous demineralization. IMPRESSION: Complete opacification of the LEFT hemithorax by large pleural effusion with complete atelectasis of LEFT lung and question drowned lung appearance of particularly the LEFT lower lobe. Again identified abrupt cut off of air within LEFT mainstem bronchus, may be related to mucous plugging though cannot exclude tumor with this  appearance. Subpleural area of pleural thickening versus nodularity posterior RIGHT upper lobe 18 x 7 mm, minimally more prominent than on previous exam; recommend continued attention on follow-up imaging. Diffuse thickening of the adrenal glands bilaterally question adrenal hyperplasia. Coronary arterial calcifications. Diffusely heterogeneous thyroid lobes with multiple nodules up to 15 mm diameter; This has been evaluated on previous imaging, thyroid ultrasound 02/14/2021. Aortic Atherosclerosis (ICD10-I70.0) and Emphysema (ICD10-J43.9). Electronically Signed   By: Lavonia Dana M.D.   On: 04/10/2021 16:19        Scheduled Meds:  (feeding supplement) PROSource Plus  30 mL Oral BID BM   acidophilus  1 capsule Oral QODAY   apixaban  5 mg Oral BID   arformoterol  15 mcg Nebulization Q12H   atorvastatin  20 mg Oral QHS   buPROPion  150 mg Oral Daily   Carbidopa-Levodopa ER  1.5 tablet Oral TID   Chlorhexidine Gluconate Cloth  6 each Topical Daily   donepezil  10 mg Oral QHS   famotidine  20 mg Oral Daily   feeding supplement  1 Container Oral TID BM   levalbuterol  0.63 mg Nebulization BID   lidocaine  2 patch Transdermal Daily   metoprolol tartrate  25 mg Oral Q6H   multivitamin with minerals  1 tablet Oral Daily  pantoprazole  40 mg Oral QPM   sertraline  150 mg Oral Daily   sodium chloride flush  10-40 mL Intracatheter Q12H   sodium chloride flush  3 mL Intravenous Q12H   umeclidinium bromide  1 puff Inhalation Daily   Continuous Infusions:  sodium chloride 10 mL/hr at 04/11/21 0529   amiodarone 30 mg/hr (04/12/21 1033)     LOS: 3 days    Time spent: 44 minutes spent on chart review, discussion with nursing staff, consultants, updating family and interview/physical exam; more than 50% of that time was spent in counseling and/or coordination of care.    Taiten Brawn J [place] DO [facility] Available via Epic secure chat 7am-7pm After these hours, please refer to coverage  provider listed on [email or web address] 04/12/2021, 12:12 PM

## 2021-04-12 NOTE — Progress Notes (Signed)
Received critical result Glucose 934 and Calcium 6.4. Primary nurse requested labs to be redrawn.

## 2021-04-12 NOTE — TOC Progression Note (Signed)
Transition of Care [facility]) - Progression Note    Patient Details  Name: James [name] MRN: [id number] Date of Birth: April 11, 1945  Transition of [facility] CM/SW [place], [place] Phone Number: 04/12/2021, 11:16 AM  Clinical Narrative:     Palliative NP requested for CSW to cancel palliative referral to [place]. Patient has palliative services with [facility] that will follow him at SNF. CSW spoke with Cheri with [facility] and cancelled referral for palliative services. Patient has SNF bed at [facility] place when medically ready. [facility] palliatives services will follow him at [facility] place when medically ready. CSW will continue to follow and assist with patients dc planning needs.  Expected Discharge Plan: [place] Barriers to Discharge: Continued Medical Work up  Expected Discharge Plan and Services Expected Discharge Plan: [place] In-house Referral: Clinical Social Work     Living arrangements for the past 2 months: [place]                                       Social Determinants of Health (SDOH) Interventions    Readmission Risk Interventions No flowsheet data found.

## 2021-04-13 DIAGNOSIS — I4892 Unspecified atrial flutter: Secondary | ICD-10-CM | POA: Diagnosis not present

## 2021-04-13 LAB — BASIC METABOLIC PANEL
Anion gap: 8 (ref 5–15)
BUN: 7 mg/dL — ABNORMAL LOW (ref 8–23)
CO2: 30 mmol/L (ref 22–32)
Calcium: 8.4 mg/dL — ABNORMAL LOW (ref 8.9–10.3)
Chloride: 99 mmol/L (ref 98–111)
Creatinine, Ser: 0.53 mg/dL — ABNORMAL LOW (ref 0.61–1.24)
GFR, Estimated: >60 mL/min (ref >60)
Glucose, Bld: 125 mg/dL — ABNORMAL HIGH (ref 70–99)
Potassium: 3.6 mmol/L (ref 3.5–5.1)
Sodium: 137 mmol/L (ref 135–145)

## 2021-04-13 LAB — BODY FLUID CULTURE W GRAM STAIN: Culture: NO GROWTH

## 2021-04-13 LAB — MAGNESIUM: Magnesium: 1.8 mg/dL (ref 1.7–2.4)

## 2021-04-13 MED ORDER — AMIODARONE HCL 200 MG PO TABS
400.0000 mg | ORAL_TABLET | Freq: Two times a day (BID) | ORAL | Status: DC
  Administered 2021-04-13 – 2021-04-15 (×5): 400 mg via ORAL
  Filled 2021-04-13 (×7): qty 2

## 2021-04-13 MED ORDER — POTASSIUM CHLORIDE CRYS ER 20 MEQ PO TBCR
40.0000 meq | EXTENDED_RELEASE_TABLET | Freq: Once | ORAL | Status: AC
  Administered 2021-04-13: 40 meq via ORAL
  Filled 2021-04-13: qty 2

## 2021-04-13 MED ORDER — MAGNESIUM SULFATE 2 GM/50ML IV SOLN
2.0000 g | Freq: Once | INTRAVENOUS | Status: AC
  Administered 2021-04-13: 2 g via INTRAVENOUS
  Filled 2021-04-13: qty 50

## 2021-04-13 MED ORDER — AMIODARONE HCL 200 MG PO TABS: 400.0000 mg | ORAL_TABLET | Freq: Two times a day (BID) | ORAL | Status: DC

## 2021-04-13 NOTE — Progress Notes (Addendum)
Charge RN reported this is the second time the IVT has come to assess his PAC. Upon arrival, site deaccessed. Wife and patient state when he rolled over in bed, the dressing had a "hole" and the power huber needle was hanging out. Cleaned, reaccessed ad dressed per protocol. Pt c/o discomfort with manipulation. No redness, edema, warmth, or drainage from site. RN instructed to consult IVT if needed.

## 2021-04-13 NOTE — Progress Notes (Signed)
Progress Note  Patient Name: James [name] Date of Encounter: 04/13/2021  [facility] Cardiologist: [name], MD   Subjective   No acute issues overnight  Inpatient Medications    Scheduled Meds:  (feeding supplement) PROSource Plus  30 mL Oral BID BM   acidophilus  1 capsule Oral QODAY   apixaban  5 mg Oral BID   arformoterol  15 mcg Nebulization Q12H   atorvastatin  20 mg Oral QHS   buPROPion  150 mg Oral Daily   Carbidopa-Levodopa ER  1.5 tablet Oral TID   Chlorhexidine Gluconate Cloth  6 each Topical Daily   donepezil  10 mg Oral QHS   famotidine  20 mg Oral Daily   feeding supplement  1 Container Oral TID BM   levalbuterol  0.63 mg Nebulization BID   lidocaine  2 patch Transdermal Daily   metoprolol tartrate  25 mg Oral Q6H   multivitamin with minerals  1 tablet Oral Daily   pantoprazole  40 mg Oral QPM   sertraline  150 mg Oral Daily   sodium chloride flush  10-40 mL Intracatheter Q12H   sodium chloride flush  3 mL Intravenous Q12H   umeclidinium bromide  1 puff Inhalation Daily   Continuous Infusions:  sodium chloride 10 mL/hr at 04/11/21 0529   amiodarone 30 mg/hr (04/13/21 1059)   magnesium sulfate bolus IVPB     PRN Meds: acetaminophen **OR** acetaminophen, lidocaine-prilocaine, loperamide, sodium chloride flush   Vital Signs    Vitals:   04/12/21 1138 04/12/21 2033 04/13/21 0435 04/13/21 0826  BP: 118/76 105/67 104/65 110/81  Pulse: (!) 152 76 74 76  Resp: (!) 22 (!) 21 (!) 21 20  Temp: 98.6 F (37 C) 97.8 F (36.6 C) 98.5 F (36.9 C) 98.1 F (36.7 C)  TempSrc: Oral Oral Oral Oral  SpO2: 96% 99% 95% 98%  Weight:      Height:        Intake/Output Summary (Last 24 hours) at 04/13/2021 1101 Last data filed at 04/13/2021 0746 Gross per 24 hour  Intake 802.96 ml  Output 600 ml  Net 202.96 ml   Last 3 Weights 04/09/2021 04/03/2021 04/02/2021  Weight (lbs) 187 lb 6.3 oz 189 lb 2.5 oz 191 lb 12.8 oz  Weight (kg) 85 kg 85.8 kg 87 kg       Telemetry    Aflutter variable rates - Personally Reviewed  ECG    N/a - Personally Reviewed  Physical Exam   GEN: No acute distress.   Neck: No JVD Cardiac: irreg Respiratory: coarse bilaterally GI: Soft, nontender, non-distended  MS: No edema; No deformity. Neuro:  Nonfocal  Psych: Normal affect   Labs    High Sensitivity Troponin:  No results for input(s): TROPONINIHS in the last 720 hours.   Chemistry Recent Labs  Lab 04/09/21 0245 04/09/21 0850 04/12/21 0555 04/12/21 0659 04/13/21 0250  NA 137   < > 121* 137 137  K 3.0*   < > 2.8* 3.5 3.6  CL 96*   < > 77* 98 99  CO2 29   < > 24 32 30  GLUCOSE 94   < > 934* 131* 125*  BUN 14   < > 6* 6* 7*  CREATININE 0.87   < > 0.62 0.48* 0.53*  CALCIUM 8.8*   < > 6.4* 8.2* 8.4*  MG  --    < > 1.4* 1.7 1.8  PROT 5.1*  --   --   --   --  ALBUMIN 1.8*  --   --   --   --   AST 14*  --   --   --   --   ALT 9  --   --   --   --   ALKPHOS 70  --   --   --   --   BILITOT 0.2*  --   --   --   --   GFRNONAA >60   < > >60 >60 >60  ANIONGAP 12   < > 20* 7 8   < > = values in this interval not displayed.    Lipids No results for input(s): CHOL, TRIG, HDL, LABVLDL, LDLCALC, CHOLHDL in the last 168 hours.  Hematology Recent Labs  Lab 04/10/21 0304 04/11/21 0634 04/12/21 0555  WBC 7.2 5.2 4.5  RBC 3.77* 3.74* 3.54*  HGB 9.5* 9.3* 8.8*  HCT 31.1* 30.8* 29.6*  MCV 82.5 82.4 83.6  MCH 25.2* 24.9* 24.9*  MCHC 30.5 30.2 29.7*  RDW 28.7* 28.9* 28.7*  PLT 172 183 198   Thyroid No results for input(s): TSH, FREET4 in the last 168 hours.  BNP Recent Labs  Lab 04/09/21 0246  BNP 177.3*    DDimer No results for input(s): DDIMER in the last 168 hours.   Radiology    No results found.  Cardiac Studies     Patient Profile     76 y.o. 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 Tamala Julian.  Assessment & Plan    1.Atrial flutter - on amio gtt 30mg /hr, oral lopressro 25mg  every 6 hours - eliquis for stroke prevention - bp's look ok  - from afib clinic notes TEE/DCCV jan 2023. - recurrent aflutter this admission - 04/09/21 started on IV amio   - rates ok today, change IV amio to 400mg  bid daily x 7 days then 200mg  bid x 2 weeeks, then 200mg  daily. Typically would consider repeat DCCV after amio load but with ongoing palliative discussions would just treat medically at this time. If amio does not convert over time could d/c and just work to rate control. If bp's an issue could consider digoxin. Lung disease on amio but overall poor prognosis with lung cancer and difficult to control aflutter I think best option.    2. Lung cancer/pleural effusion Followed by onoclogy --Palliative care consulted for assistance with goals of care/medical decision making; likely poor prognosis and hospice candidate.    For questions or updates, please contact [place] Please consult [email or web address] for contact info under        Signed, Carlyle Dolly, MD  04/13/2021, 11:01 AM

## 2021-04-13 NOTE — Progress Notes (Signed)
PROGRESS NOTE    James [name]  KPT:[id number] DOB: 07/07/1945 DOA: 04/09/2021 PCP: [name], MD    Brief Narrative:  James [name]  is a 76 y.o. male with past medical history significant for stage IIIa non-small cell lung cancer, recently completed radiation (follows with Dr. Julien Nordmann, chemotherapy held due to functional decline), COPD, chronic respiratory failure with hypoxia (on 4 L supplemental oxygen via nasal cannula at home), paroxysmal atrial flutter on Eliquis, Parkinson's disease, diabetes mellitus type 2, hypertension, hyperlipidemia, iron deficiency anemia, and depression who presented to [facility] ED from [facility] due to severe tachycardia.  Patient reports that he has been having increased shortness of breath.  He had been at the nursing facility for the last month and notes that is also around the time that he stopped receiving chemotherapy treatments.  Over the last 3 months he reports that he is lost approximately 50 pounds. Patient had been on Ceftin since 2/8.   In route with EMS heart rate was noted to be in the 150s.  In the ED patient was noted to be afebrile, heart rates elevated into the 150s in atrial flutter, respirations elevated to 24, blood pressure soft with maps greater than 65, and O2 saturations otherwise maintained on 4 L.  Labs significant for hemoglobin 9.7, potassium 3 albumin 1.8, BNP 177.3.  Chest x-ray noted complete opacification of the left lung possibly representing moderate to large pleural effusion with atelectasis/infiltrate, or possibly postobstructive pneumonitis.  He had initially been given 500 mL of normal saline IV fluid bolus and started on Cardizem drip.  Hospitalist service consulted for further evaluation and management of atrial flutter with RVR.     Assessment & Plan:   Assessment and Plan: * Atrial flutter with rapid ventricular response ([place])- (present on admission) Patient presented after being found to be in atrial flutter  with heart rates into the 150s.  Once in the ED patient was started on a Cardizem drip with some improvement in heart rates.CHA2DS2-VASc score = 5.  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  2022 with LVEF 55-60%, mild concentric LVH, LA/RA moderately dilated, small pericardial effusion without tamponade, mild dilation ascending aorta measuring 40 mm, IVC dilated.  TSH 1.111 on 02/18/2021. --Cardiology following, appreciate assistance --amiodarone drip  Transitioned to 400mg  PO BID today x 7 days (followed by 200mg  BID x 2 weeks and 200mg  daily) --metoprolol tartrate 25 mg p.o. q6h today --Eliquis 5 mg PO BID --Continue to monitor on telemetry  Pleural effusion- (present on admission) Patient presented with opacification of the left lung field.  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 s/p IR left thoracentesis 2/15 with removal 2 L amber/blood-tinged fluid with repeat chest x-ray thereafter showing continued complete opacification of the left hemithorax.  CT chest with contrast 2/15 with complete opacification left hemithorax with abrupt cut off left mainstem bronchus concerning for mucous plug versus tumor invasion.  Pleural fluid studies consistent with exudative effusion and cytology with no malignant cells, although finding malignant cells and pleural fluid is low yield.  Pulmonology was consulted for consideration of bronchoscopy, given his progressive malignancy, poor health recommended supportive care and comfort measures. -- Evaluated by palliative care with plan to be followed by palliative care on discharge with eventual transition to hospice  Primary cancer of left lower lobe of lung ([place])- (present on admission) Follows with medical oncology outpatient, Dr. Julien Nordmann.  Despite  thoracentesis, patient continues with persistent left sided opacification concerning for  worsening/progressive malignancy.  Chemotherapy has been on hold due to functional decline per oncology notes. --Partial code with DNI --Plan outpatient palliative care to follow on discharge  COPD with emphysema ([place])- (present on admission) Home regimen includes albuterol MDI as needed, Spiriva, formoterol neb twice daily, Xopenex neb 3 times daily as needed. --Brovana neb every 12 hours --Incruse Ellipta 1 puff daily --Continue home oxygen, 4 L per nasal cannula  Chronic respiratory failure with hypoxia ([place])- (present on admission) Continues to maintain adequate oxygenation on home 4 L of oxygen. --Continue monitor SPO2, maintain SPO2 greater than 88%  Hypokalemia- (present on admission) Potassium 3.6 and Magnesium1.8 this morning, will replete. (goal K >/= 4.0 and Mag >/= 2.0) --Repeat electrolytes in a.m.  Hyperlipidemia associated with type 2 diabetes mellitus ([place])- (present on admission) Patient has known history of diabetes, but is well controlled last hemoglobin A1c was 5 on 2/5.  Admission glucose 94.Marland Kitchen  Home medication regimen includes metformin 500 mg twice daily and atorvastatin 20 mg nightly. --Continue atorvastatin 20 mg p.o. daily --Hold metformin while inpatient --Continue monitor glucose levels, if elevated, will start sliding scale insulin  Iron deficiency anemia- (present on admission) Chronic.  Hemoglobin stable and at baseline. --Continue to monitor  Parkinson's disease ([place])- (present on admission) --Continue Sinemet CR 25-100mg  1.5 tablets TID --Donepezil 10 mg p.o. nightly  Protein-calorie malnutrition, severe ([place])- (present on admission) Body mass index is 24.72 kg/m. On admission albumin was noted to be low at 1.8. Nutrition Status: Nutrition Problem: Severe Malnutrition Etiology: chronic illness (stage IIIa non-small cell lung cancer) Signs/Symptoms: severe muscle depletion, energy intake < 75% for > or equal to 1 month, percent weight loss Percent  weight loss: 6 % (in one month) Interventions: Boost Breeze, Liberalize Diet, MVI, Prostat -- Dietitian following, encourage increased oral intake, supplementation  Depression- (present on admission) --Wellbutrin 150 mg p.o. daily --Sertraline 150 mg p.o. daily  Hypomagnesemia- (present on admission) Magnesium 1.8 this morning, will replete -- Repeat magnesium level in a.m.  GERD (gastroesophageal reflux disease)- (present on admission) --Pepcid 20 mg p.o. daily --Protonix 40 mg p.o. daily     DVT prophylaxis:   Heparin dripapixaban (ELIQUIS) tablet 5 mg apixaban (ELIQUIS) tablet 5 mg    Code Status: Partial Code Family Communication: Updated spouse present at bedside this morning  Disposition Plan:  Level of care: Progressive Status is: Inpatient Remains inpatient appropriate because: Remains on amiodarone gtt, cardiology increasing p.o. metoprolol today, pulmonology recommended comfort measures given his progressive decline and overall poor prognosis, pending palliative care consultation.  Anticipate discharge back to [facility] with palliative care to follow versus hospice once cardiology signs off and heart rate better controlled.  consultation.  Consultants:  Cardiology Palliative care PCCM  Procedures:  IR thoracentesis, left 2/15; 2 L removed  Antimicrobials:  None   Subjective: Patient seen examined at bedside, resting comfortably.  Spouse present at bedside.  No specific complaints this morning.  Heart rate much better controlled on telemetry and cardiology converting amiodarone drip to oral amiodarone today.  Patient reports that his appetite is improved, but spouse states otherwise.  No other specific questions or concerns at this time.  Plan likely discharge to [place] with outpatient pelvic care to follow on Monday with eventual transition to hospice thereafter. Patient denies headache, no fever/chills, no nausea/vomiting/diarrhea, no chest pain, no  palpitations, no abdominal pain, no fatigue, no paresthesias.  No acute events  overnight per nursing staff  Objective: Vitals:   04/12/21 1138 04/12/21 2033 04/13/21 0435 04/13/21 0826  BP: 118/76 105/67 104/65 110/81  Pulse: (!) 152 76 74 76  Resp: (!) 22 (!) 21 (!) 21 20  Temp: 98.6 F (37 C) 97.8 F (36.6 C) 98.5 F (36.9 C) 98.1 F (36.7 C)  TempSrc: Oral Oral Oral Oral  SpO2: 96% 99% 95% 98%  Weight:      Height:        Intake/Output Summary (Last 24 hours) at 04/13/2021 1201 Last data filed at 04/13/2021 0746 Gross per 24 hour  Intake 802.96 ml  Output 600 ml  Net 202.96 ml   Filed Weights   04/09/21 0400  Weight: 85 kg    Examination:  Physical Exam: GEN: NAD, alert and oriented x 3, chronically ill/cachectic in appearance, appears older than stated age HEENT: NCAT, PERRL, EOMI, sclera clear, MMM PULM: Breath sounds absent left hemithorax, breath sounds right base slightly decreased, no crackles/wheezing, normal respiratory effort on 4 L nasal cannula which is his baseline CV: Irregularly irregular rhythm, normal rate w/o M/G/R GI: abd soft, NTND, NABS, no R/G/M MSK: no peripheral edema, muscle strength globally intact 5/5 bilateral upper/lower extremities NEURO: CN II-XII intact, no focal deficits, sensation to light touch intact PSYCH: normal mood/affect Integumentary: dry/intact, no rashes or wounds    Data Reviewed: I have personally reviewed following labs and imaging studies  CBC: Recent Labs  Lab 04/09/21 0245 04/10/21 0304 04/11/21 0634 04/12/21 0555  WBC 10.3 7.2 5.2 4.5  HGB 9.7* 9.5* 9.3* 8.8*  HCT 32.6* 31.1* 30.8* 29.6*  MCV 82.7 82.5 82.4 83.6  PLT 216 172 183 099   Basic Metabolic Panel: Recent Labs  Lab 04/10/21 0304 04/11/21 0426 04/12/21 0555 04/12/21 0659 04/13/21 0250  NA 138 137 121* 137 137  K 3.4* 3.2* 2.8* 3.5 3.6  CL 97* 96* 77* 98 99  CO2 30 32 24 32 30  GLUCOSE 100* 130* 934* 131* 125*  BUN 11 7* 6* 6* 7*   CREATININE 0.79 0.60* 0.62 0.48* 0.53*  CALCIUM 8.6* 8.1* 6.4* 8.2* 8.4*  MG 1.7 1.7 1.4* 1.7 1.8   GFR: Estimated Creatinine Clearance: 90.2 mL/min (A) (by C-G formula based on SCr of 0.53 mg/dL (L)). Liver Function Tests: Recent Labs  Lab 04/09/21 0245  AST 14*  ALT 9  ALKPHOS 70  BILITOT 0.2*  PROT 5.1*  ALBUMIN 1.8*   No results for input(s): LIPASE, AMYLASE in the last 168 hours. No results for input(s): AMMONIA in the last 168 hours. Coagulation Profile: Recent Labs  Lab 04/09/21 0245  INR 2.6*   Cardiac Enzymes: No results for input(s): CKTOTAL, CKMB, CKMBINDEX, TROPONINI in the last 168 hours. BNP (last 3 results) No results for input(s): PROBNP in the last 8760 hours. HbA1C: No results for input(s): HGBA1C in the last 72 hours. CBG: No results for input(s): GLUCAP in the last 168 hours. Lipid Profile: No results for input(s): CHOL, HDL, LDLCALC, TRIG, CHOLHDL, LDLDIRECT in the last 72 hours. Thyroid Function Tests: No results for input(s): TSH, T4TOTAL, FREET4, T3FREE, THYROIDAB in the last 72 hours. Anemia Panel: No results for input(s): VITAMINB12, FOLATE, FERRITIN, TIBC, IRON, RETICCTPCT in the last 72 hours. Sepsis Labs: Recent Labs  Lab 04/09/21 0850  PROCALCITON 0.10    Recent Results (from the past 240 hour(s))  Body fluid culture w Gram Stain     Status: None   Collection Time: 04/10/21 12:12 PM   Specimen: Thoracic  Result Value Ref Range Status   Specimen Description THORACIC  Final   Special Requests LUNG LEFT  Final   Gram Stain   Final    FEW WBC PRESENT,BOTH PMN AND MONONUCLEAR NO ORGANISMS SEEN RESULT CALLED TO, READ BACK BY AND VERIFIED WITH: P.KOSHWAH AT 1510 ON 04/10/2021 BY T.SAAD.    Culture   Final    NO GROWTH 3 DAYS Performed at [facility], [place] [address], [place], [place] [address]    Report Status 04/13/2021 FINAL  Final         Radiology Studies: No results found.      Scheduled Meds:  (feeding  supplement) PROSource Plus  30 mL Oral BID BM   acidophilus  1 capsule Oral QODAY   amiodarone  400 mg Oral BID   apixaban  5 mg Oral BID   arformoterol  15 mcg Nebulization Q12H   atorvastatin  20 mg Oral QHS   buPROPion  150 mg Oral Daily   Carbidopa-Levodopa ER  1.5 tablet Oral TID   Chlorhexidine Gluconate Cloth  6 each Topical Daily   donepezil  10 mg Oral QHS   famotidine  20 mg Oral Daily   feeding supplement  1 Container Oral TID BM   levalbuterol  0.63 mg Nebulization BID   lidocaine  2 patch Transdermal Daily   metoprolol tartrate  25 mg Oral Q6H   multivitamin with minerals  1 tablet Oral Daily   pantoprazole  40 mg Oral QPM   sertraline  150 mg Oral Daily   sodium chloride flush  10-40 mL Intracatheter Q12H   sodium chloride flush  3 mL Intravenous Q12H   umeclidinium bromide  1 puff Inhalation Daily   Continuous Infusions:  sodium chloride 10 mL/hr at 04/11/21 0529   magnesium sulfate bolus IVPB 2 g (04/13/21 1103)     LOS: 4 days    Time spent: 40 minutes spent on chart review, discussion with nursing staff, consultants, updating family and interview/physical exam; more than 50% of that time was spent in counseling and/or coordination of care.    Beverly Suriano J [place] DO [facility] Available via Epic secure chat 7am-7pm After these hours, please refer to coverage provider listed on [email or web address] 04/13/2021, 12:01 PM

## 2021-04-14 DIAGNOSIS — I4892 Unspecified atrial flutter: Secondary | ICD-10-CM | POA: Diagnosis not present

## 2021-04-14 LAB — BASIC METABOLIC PANEL
Anion gap: 6 (ref 5–15)
BUN: 8 mg/dL (ref 8–23)
CO2: 33 mmol/L — ABNORMAL HIGH (ref 22–32)
Calcium: 8.2 mg/dL — ABNORMAL LOW (ref 8.9–10.3)
Chloride: 98 mmol/L (ref 98–111)
Creatinine, Ser: 0.48 mg/dL — ABNORMAL LOW (ref 0.61–1.24)
GFR, Estimated: >60 mL/min (ref >60)
Glucose, Bld: 119 mg/dL — ABNORMAL HIGH (ref 70–99)
Potassium: 3.6 mmol/L (ref 3.5–5.1)
Sodium: 137 mmol/L (ref 135–145)

## 2021-04-14 LAB — RESP PANEL BY RT-PCR (FLU A&B, COVID) ARPGX2
Influenza A by PCR: NEGATIVE
Influenza B by PCR: NEGATIVE
SARS Coronavirus 2 by RT PCR: NEGATIVE

## 2021-04-14 LAB — MAGNESIUM: Magnesium: 1.7 mg/dL (ref 1.7–2.4)

## 2021-04-14 LAB — CD19 AND CD20, FLOW CYTOMETRY

## 2021-04-14 MED ORDER — METOPROLOL TARTRATE 25 MG PO TABS
25.0000 mg | ORAL_TABLET | Freq: Once | ORAL | Status: AC
  Administered 2021-04-14: 25 mg via ORAL
  Filled 2021-04-14: qty 1

## 2021-04-14 MED ORDER — MAGNESIUM SULFATE 2 GM/50ML IV SOLN
2.0000 g | Freq: Once | INTRAVENOUS | Status: AC
  Administered 2021-04-14: 2 g via INTRAVENOUS
  Filled 2021-04-14: qty 50

## 2021-04-14 MED ORDER — METOPROLOL TARTRATE 50 MG PO TABS
50.0000 mg | ORAL_TABLET | Freq: Two times a day (BID) | ORAL | Status: DC
Start: 2021-04-14 — End: 2021-04-15
  Administered 2021-04-14 – 2021-04-15 (×2): 50 mg via ORAL
  Filled 2021-04-14 (×2): qty 1

## 2021-04-14 MED ORDER — POTASSIUM CHLORIDE CRYS ER 20 MEQ PO TBCR
40.0000 meq | EXTENDED_RELEASE_TABLET | Freq: Once | ORAL | Status: DC
  Filled 2021-04-14: qty 2

## 2021-04-14 NOTE — Progress Notes (Signed)
PROGRESS NOTE    James [name]  LFY:[id number] DOB: 11/21/1945 DOA: 04/09/2021 PCP: [name], MD    Brief Narrative:  James [name]  is a 76 y.o. male with past medical history significant for stage IIIa non-small cell lung cancer, recently completed radiation (follows with Dr. Julien Nordmann, chemotherapy held due to functional decline), COPD, chronic respiratory failure with hypoxia (on 4 L supplemental oxygen via nasal cannula at home), paroxysmal atrial flutter on Eliquis, Parkinson's disease, diabetes mellitus type 2, hypertension, hyperlipidemia, iron deficiency anemia, and depression who presented to [facility] ED from [facility] due to severe tachycardia.  Patient reports that he has been having increased shortness of breath.  He had been at the nursing facility for the last month and notes that is also around the time that he stopped receiving chemotherapy treatments.  Over the last 3 months he reports that he is lost approximately 50 pounds. Patient had been on Ceftin since 2/8.   In route with EMS heart rate was noted to be in the 150s.  In the ED patient was noted to be afebrile, heart rates elevated into the 150s in atrial flutter, respirations elevated to 24, blood pressure soft with maps greater than 65, and O2 saturations otherwise maintained on 4 L.  Labs significant for hemoglobin 9.7, potassium 3 albumin 1.8, BNP 177.3.  Chest x-ray noted complete opacification of the left lung possibly representing moderate to large pleural effusion with atelectasis/infiltrate, or possibly postobstructive pneumonitis.  He had initially been given 500 mL of normal saline IV fluid bolus and started on Cardizem drip.  Hospitalist service consulted for further evaluation and management of atrial flutter with RVR.     Assessment & Plan:   Assessment and Plan: * Atrial flutter with rapid ventricular response ([place])- (present on admission) Patient presented after being found to be in atrial flutter  with heart rates into the 150s.  Once in the ED patient was started on a Cardizem drip with some improvement in heart rates.CHA2DS2-VASc score = 5.  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  2022 with LVEF 55-60%, mild concentric LVH, LA/RA moderately dilated, small pericardial effusion without tamponade, mild dilation ascending aorta measuring 40 mm, IVC dilated.  TSH 1.111 on 02/18/2021. --Cardiology following, appreciate assistance --amiodarone drip  Transitioned to 400mg  PO BID today x 7 days (followed by 200mg  BID x 2 weeks and 200mg  daily) --metoprolol tartrate 50mg  BID --Eliquis 5 mg PO BID --Continue to monitor on telemetry  Pleural effusion- (present on admission) Patient presented with opacification of the left lung field.  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 s/p IR left thoracentesis 2/15 with removal 2 L amber/blood-tinged fluid with repeat chest x-ray thereafter showing continued complete opacification of the left hemithorax.  CT chest with contrast 2/15 with complete opacification left hemithorax with abrupt cut off left mainstem bronchus concerning for mucous plug versus tumor invasion.  Pleural fluid studies consistent with exudative effusion and cytology with no malignant cells, although finding malignant cells and pleural fluid is low yield.  Pulmonology was consulted for consideration of bronchoscopy, given his progressive malignancy, poor health recommended supportive care and comfort measures. -- Evaluated by palliative care with plan to be followed by palliative care on discharge with eventual transition to hospice  Primary cancer of left lower lobe of lung ([place])- (present on admission) Follows with medical oncology outpatient, Dr. Julien Nordmann.  Despite thoracentesis, patient continues  with persistent left sided opacification concerning for  worsening/progressive malignancy.  Chemotherapy has been on hold due to functional decline per oncology notes. --Partial code with DNI --Plan outpatient palliative care to follow on discharge  COPD with emphysema ([place])- (present on admission) Home regimen includes albuterol MDI as needed, Spiriva, formoterol neb twice daily, Xopenex neb 3 times daily as needed. --Brovana neb every 12 hours --Incruse Ellipta 1 puff daily --Continue home oxygen, 4 L per nasal cannula  Chronic respiratory failure with hypoxia ([place])- (present on admission) Continues to maintain adequate oxygenation on home 4 L of oxygen. --Continue monitor SPO2, maintain SPO2 greater than 88%  Hypokalemia- (present on admission) Potassium 3.6 and Magnesium1.8 this morning, will replete. (goal K >/= 4.0 and Mag >/= 2.0) --Repeat electrolytes in a.m.  Hyperlipidemia associated with type 2 diabetes mellitus ([place])- (present on admission) Patient has known history of diabetes, but is well controlled last hemoglobin A1c was 5 on 2/5.  Admission glucose 94.Marland Kitchen  Home medication regimen includes metformin 500 mg twice daily and atorvastatin 20 mg nightly. --Continue atorvastatin 20 mg p.o. daily --Hold metformin while inpatient --Continue monitor glucose levels, if elevated, will start sliding scale insulin  Iron deficiency anemia- (present on admission) Chronic.  Hemoglobin stable and at baseline. --Continue to monitor  Parkinson's disease ([place])- (present on admission) --Continue Sinemet CR 25-100mg  1.5 tablets TID --Donepezil 10 mg p.o. nightly  Protein-calorie malnutrition, severe ([place])- (present on admission) Body mass index is 24.72 kg/m. On admission albumin was noted to be low at 1.8. Nutrition Status: Nutrition Problem: Severe Malnutrition Etiology: chronic illness (stage IIIa non-small cell lung cancer) Signs/Symptoms: severe muscle depletion, energy intake < 75% for > or equal to 1 month, percent weight loss Percent  weight loss: 6 % (in one month) Interventions: Boost Breeze, Liberalize Diet, MVI, Prostat -- Dietitian following, encourage increased oral intake, supplementation  Depression- (present on admission) --Wellbutrin 150 mg p.o. daily --Sertraline 150 mg p.o. daily  Hypomagnesemia; hypokalemia- (present on admission) Potassium 3.6 and magnesium 1.7 this morning, will replete -- Repeat electrolytes to include magnesium level in a.m.  GERD (gastroesophageal reflux disease)- (present on admission) --Pepcid 20 mg p.o. daily --Protonix 40 mg p.o. daily     DVT prophylaxis:   Heparin dripapixaban (ELIQUIS) tablet 5 mg apixaban (ELIQUIS) tablet 5 mg    Code Status: Partial Code Family Communication: Updated spouse present at bedside this morning  Disposition Plan:  Level of care: Progressive Status is: Inpatient Remains inpatient appropriate because: Amiodarone drip now transition to oral medication, anticipate discharge back to [facility] with palliative care to follow likely tomorrow.  Will obtain repeat COVID-19 for anticipated discharge  Consultants:  Cardiology Palliative care PCCM  Procedures:  IR thoracentesis, left 2/15; 2 L removed  Antimicrobials:  None   Subjective: Patient seen examined at bedside, resting comfortably.  Reports that he is tired and fatigued, gets very poor sleep in the hospital because he is woken several times during the night.  Seen by cardiology and optimize his metoprolol to twice daily, rates seem much better controlled and now on oral amiodarone.  Likely plan to discharge to [place] with outpatient palliative care to follow tomorrow with eventual transition to hospice thereafter.  Patient with no other complaints or concerns at this time.  Denies headache, no fever/chills/night sweats, no nausea/vomiting/diarrhea, no chest pain, no palpitations, no abdominal pain, no paresthesias.  No acute events overnight per nursing  staff.  Objective: Vitals:   04/14/21 0037 04/14/21  0340 04/14/21 0708 04/14/21 0900  BP: 100/60 109/62 111/64   Pulse: 72 65 73   Resp: 17 20 (!) 23   Temp:  98.1 F (36.7 C) 98.2 F (36.8 C)   TempSrc:  Oral Oral   SpO2: 94% 96% 94% 96%  Weight:      Height:        Intake/Output Summary (Last 24 hours) at 04/14/2021 1149 Last data filed at 04/14/2021 0539 Gross per 24 hour  Intake 293.3 ml  Output 500 ml  Net -206.7 ml   Filed Weights   04/09/21 0400  Weight: 85 kg    Examination:  Physical Exam: GEN: NAD, alert and oriented x 3, chronically ill/cachectic in appearance, appears older than stated age HEENT: NCAT, PERRL, EOMI, sclera clear, MMM PULM: Breath sounds absent left hemithorax, breath sounds right base slightly decreased, no crackles/wheezing, normal respiratory effort on 4 L nasal cannula which is his baseline CV: Irregularly irregular rhythm, normal rate w/o M/G/R GI: abd soft, NTND, NABS, no R/G/M MSK: no peripheral edema, muscle strength globally intact 5/5 bilateral upper/lower extremities NEURO: CN II-XII intact, no focal deficits, sensation to light touch intact PSYCH: normal mood/affect Integumentary: dry/intact, no rashes or wounds    Data Reviewed: I have personally reviewed following labs and imaging studies  CBC: Recent Labs  Lab 04/09/21 0245 04/10/21 0304 04/11/21 0634 04/12/21 0555  WBC 10.3 7.2 5.2 4.5  HGB 9.7* 9.5* 9.3* 8.8*  HCT 32.6* 31.1* 30.8* 29.6*  MCV 82.7 82.5 82.4 83.6  PLT 216 172 183 469   Basic Metabolic Panel: Recent Labs  Lab 04/11/21 0426 04/12/21 0555 04/12/21 0659 04/13/21 0250 04/14/21 0305  NA 137 121* 137 137 137  K 3.2* 2.8* 3.5 3.6 3.6  CL 96* 77* 98 99 98  CO2 32 24 32 30 33*  GLUCOSE 130* 934* 131* 125* 119*  BUN 7* 6* 6* 7* 8  CREATININE 0.60* 0.62 0.48* 0.53* 0.48*  CALCIUM 8.1* 6.4* 8.2* 8.4* 8.2*  MG 1.7 1.4* 1.7 1.8 1.7   GFR: Estimated Creatinine Clearance: 90.2 mL/min (A) (by C-G  formula based on SCr of 0.48 mg/dL (L)). Liver Function Tests: Recent Labs  Lab 04/09/21 0245  AST 14*  ALT 9  ALKPHOS 70  BILITOT 0.2*  PROT 5.1*  ALBUMIN 1.8*   No results for input(s): LIPASE, AMYLASE in the last 168 hours. No results for input(s): AMMONIA in the last 168 hours. Coagulation Profile: Recent Labs  Lab 04/09/21 0245  INR 2.6*   Cardiac Enzymes: No results for input(s): CKTOTAL, CKMB, CKMBINDEX, TROPONINI in the last 168 hours. BNP (last 3 results) No results for input(s): PROBNP in the last 8760 hours. HbA1C: No results for input(s): HGBA1C in the last 72 hours. CBG: No results for input(s): GLUCAP in the last 168 hours. Lipid Profile: No results for input(s): CHOL, HDL, LDLCALC, TRIG, CHOLHDL, LDLDIRECT in the last 72 hours. Thyroid Function Tests: No results for input(s): TSH, T4TOTAL, FREET4, T3FREE, THYROIDAB in the last 72 hours. Anemia Panel: No results for input(s): VITAMINB12, FOLATE, FERRITIN, TIBC, IRON, RETICCTPCT in the last 72 hours. Sepsis Labs: Recent Labs  Lab 04/09/21 0850  PROCALCITON 0.10    Recent Results (from the past 240 hour(s))  Body fluid culture w Gram Stain     Status: None   Collection Time: 04/10/21 12:12 PM   Specimen: Thoracic  Result Value Ref Range Status   Specimen Description THORACIC  Final   Special Requests LUNG LEFT  Final  Gram Stain   Final    FEW WBC PRESENT,BOTH PMN AND MONONUCLEAR NO ORGANISMS SEEN RESULT CALLED TO, READ BACK BY AND VERIFIED WITH: P.KOSHWAH AT 1510 ON 04/10/2021 BY T.SAAD.    Culture   Final    NO GROWTH 3 DAYS Performed at [facility], [place] [address] [place], [place] [address]    Report Status 04/13/2021 FINAL  Final         Radiology Studies: No results found.      Scheduled Meds:  (feeding supplement) PROSource Plus  30 mL Oral BID BM   acidophilus  1 capsule Oral QODAY   amiodarone  400 mg Oral BID   apixaban  5 mg Oral BID   arformoterol  15 mcg  Nebulization Q12H   atorvastatin  20 mg Oral QHS   buPROPion  150 mg Oral Daily   Carbidopa-Levodopa ER  1.5 tablet Oral TID   Chlorhexidine Gluconate Cloth  6 each Topical Daily   donepezil  10 mg Oral QHS   famotidine  20 mg Oral Daily   feeding supplement  1 Container Oral TID BM   levalbuterol  0.63 mg Nebulization BID   lidocaine  2 patch Transdermal Daily   metoprolol tartrate  50 mg Oral BID   multivitamin with minerals  1 tablet Oral Daily   pantoprazole  40 mg Oral QPM   potassium chloride  40 mEq Oral Once   sertraline  150 mg Oral Daily   sodium chloride flush  10-40 mL Intracatheter Q12H   sodium chloride flush  3 mL Intravenous Q12H   umeclidinium bromide  1 puff Inhalation Daily   Continuous Infusions:  sodium chloride 10 mL/hr at 04/11/21 0529     LOS: 5 days    Time spent: 40 minutes spent on chart review, discussion with nursing staff, consultants, updating family and interview/physical exam; more than 50% of that time was spent in counseling and/or coordination of care.    James Azeez J [place] DO [facility] Available via Epic secure chat 7am-7pm After these hours, please refer to coverage provider listed on [email or web address] 04/14/2021, 11:49 AM

## 2021-04-14 NOTE — Progress Notes (Signed)
Progress Note  Patient Name: James [name] Date of Encounter: 04/14/2021  [facility] Cardiologist: [name], MD   Subjective   Fatigued this AM  Inpatient Medications    Scheduled Meds:  (feeding supplement) PROSource Plus  30 mL Oral BID BM   acidophilus  1 capsule Oral QODAY   amiodarone  400 mg Oral BID   apixaban  5 mg Oral BID   arformoterol  15 mcg Nebulization Q12H   atorvastatin  20 mg Oral QHS   buPROPion  150 mg Oral Daily   Carbidopa-Levodopa ER  1.5 tablet Oral TID   Chlorhexidine Gluconate Cloth  6 each Topical Daily   donepezil  10 mg Oral QHS   famotidine  20 mg Oral Daily   feeding supplement  1 Container Oral TID BM   levalbuterol  0.63 mg Nebulization BID   lidocaine  2 patch Transdermal Daily   metoprolol tartrate  25 mg Oral Q6H   multivitamin with minerals  1 tablet Oral Daily   pantoprazole  40 mg Oral QPM   potassium chloride  40 mEq Oral Once   sertraline  150 mg Oral Daily   sodium chloride flush  10-40 mL Intracatheter Q12H   sodium chloride flush  3 mL Intravenous Q12H   umeclidinium bromide  1 puff Inhalation Daily   Continuous Infusions:  sodium chloride 10 mL/hr at 04/11/21 0529   magnesium sulfate bolus IVPB     PRN Meds: acetaminophen **OR** acetaminophen, lidocaine-prilocaine, loperamide, sodium chloride flush   Vital Signs    Vitals:   04/13/21 2049 04/14/21 0037 04/14/21 0340 04/14/21 0708  BP:  100/60 109/62 111/64  Pulse:  72 65 73  Resp:  17 20 (!) 23  Temp:   98.1 F (36.7 C) 98.2 F (36.8 C)  TempSrc:   Oral Oral  SpO2: 100% 94% 96% 94%  Weight:      Height:        Intake/Output Summary (Last 24 hours) at 04/14/2021 0839 Last data filed at 04/14/2021 0539 Gross per 24 hour  Intake 293.3 ml  Output 500 ml  Net -206.7 ml   Last 3 Weights 04/09/2021 04/03/2021 04/02/2021  Weight (lbs) 187 lb 6.3 oz 189 lb 2.5 oz 191 lb 12.8 oz  Weight (kg) 85 kg 85.8 kg 87 kg      Telemetry    Aflutter overall  contrlled rates - Personally Reviewed  ECG    N/a - Personally Reviewed  Physical Exam   GEN: No acute distress.   Neck: No JVD Cardiac: irreg Respiratory: Coarse bilatearlly GI: Soft, nontender, non-distended  MS: No edema; No deformity. Neuro:  Nonfocal  Psych: Normal affect   Labs    High Sensitivity Troponin:  No results for input(s): TROPONINIHS in the last 720 hours.   Chemistry Recent Labs  Lab 04/09/21 0245 04/09/21 0850 04/12/21 0659 04/13/21 0250 04/14/21 0305  NA 137   < > 137 137 137  K 3.0*   < > 3.5 3.6 3.6  CL 96*   < > 98 99 98  CO2 29   < > 32 30 33*  GLUCOSE 94   < > 131* 125* 119*  BUN 14   < > 6* 7* 8  CREATININE 0.87   < > 0.48* 0.53* 0.48*  CALCIUM 8.8*   < > 8.2* 8.4* 8.2*  MG  --    < > 1.7 1.8 1.7  PROT 5.1*  --   --   --   --  ALBUMIN 1.8*  --   --   --   --   AST 14*  --   --   --   --   ALT 9  --   --   --   --   ALKPHOS 70  --   --   --   --   BILITOT 0.2*  --   --   --   --   GFRNONAA >60   < > >60 >60 >60  ANIONGAP 12   < > 7 8 6    < > = values in this interval not displayed.    Lipids No results for input(s): CHOL, TRIG, HDL, LABVLDL, LDLCALC, CHOLHDL in the last 168 hours.  Hematology Recent Labs  Lab 04/10/21 0304 04/11/21 0634 04/12/21 0555  WBC 7.2 5.2 4.5  RBC 3.77* 3.74* 3.54*  HGB 9.5* 9.3* 8.8*  HCT 31.1* 30.8* 29.6*  MCV 82.5 82.4 83.6  MCH 25.2* 24.9* 24.9*  MCHC 30.5 30.2 29.7*  RDW 28.7* 28.9* 28.7*  PLT 172 183 198   Thyroid No results for input(s): TSH, FREET4 in the last 168 hours.  BNP Recent Labs  Lab 04/09/21 0246  BNP 177.3*    DDimer No results for input(s): DDIMER in the last 168 hours.   Radiology    No results found.  Cardiac Studies    Patient Profile     76 y.o. 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 Tamala Julian.  Assessment & Plan    1.Atrial flutter - from afib clinic notes TEE/DCCV jan 2023. - recurrent aflutter this admission - 04/09/21 started on IV amio   - transitioned from IV amio to oral yesterday, plan for 400mg  bid x 7 days, then 200mg  bid, then 200mg  daily. On lopressor 25mg  every 6 hours. Remains in aflutter though rates overall controlled, infrequent RVR. BP's look fine. Transition to oral lopressor 50mg  bid for ease of admininistation - eliquis for stroke prevention  Patient may convert on amio alone. Ongoing palliative talks, depending on decisions down the road could consider repeat DCCV if remains in flutter. If decide on hospice then just rate control alone, could possibly stop amio at follow up if not working to convert him. If bp's an issue with rate control could consider digoxin. Lung disease on amio but overall poor prognosis with lung cancer and difficult to control aflutter I remains reasonable option     2. Lung cancer/pleural effusion Followed by onoclogy --Palliative care consulted for assistance with goals of care/medical decision making; likely poor prognosis and hospice candidate.  For questions or updates, please contact [place] Please consult [email or web address] for contact info under        Signed, Carlyle Dolly, MD  04/14/2021, 8:39 AM

## 2021-04-14 NOTE — Plan of Care (Signed)
°  Problem: Activity: Goal: Ability to return to baseline activity level will improve Outcome: Progressing   Problem: Cardiovascular: Goal: Ability to achieve and maintain adequate cardiovascular perfusion will improve Outcome: Progressing

## 2021-04-15 ENCOUNTER — Inpatient Hospital Stay: Payer: Medicare Other

## 2021-04-15 ENCOUNTER — Ambulatory Visit (HOSPITAL_COMMUNITY)
Admission: RE | Admit: 2021-04-15 | Discharge: 2021-04-15 | Disposition: A | Discharge status: Home / Self Care | Payer: Medicare Other | Source: Ambulatory Visit | Attending: Internal Medicine | Admitting: Internal Medicine

## 2021-04-15 ENCOUNTER — Other Ambulatory Visit: Payer: Self-pay | Admitting: Cardiology

## 2021-04-15 DIAGNOSIS — Z8616 Personal history of COVID-19: Secondary | ICD-10-CM | POA: Diagnosis not present

## 2021-04-15 DIAGNOSIS — F331 Major depressive disorder, recurrent, moderate: Secondary | ICD-10-CM | POA: Diagnosis not present

## 2021-04-15 DIAGNOSIS — E46 Unspecified protein-calorie malnutrition: Secondary | ICD-10-CM | POA: Diagnosis not present

## 2021-04-15 DIAGNOSIS — R41841 Cognitive communication deficit: Secondary | ICD-10-CM | POA: Diagnosis not present

## 2021-04-15 DIAGNOSIS — Z515 Encounter for palliative care: Secondary | ICD-10-CM | POA: Diagnosis not present

## 2021-04-15 DIAGNOSIS — Z9981 Dependence on supplemental oxygen: Secondary | ICD-10-CM | POA: Diagnosis not present

## 2021-04-15 DIAGNOSIS — R262 Difficulty in walking, not elsewhere classified: Secondary | ICD-10-CM | POA: Diagnosis not present

## 2021-04-15 DIAGNOSIS — E118 Type 2 diabetes mellitus with unspecified complications: Secondary | ICD-10-CM | POA: Diagnosis not present

## 2021-04-15 DIAGNOSIS — M6281 Muscle weakness (generalized): Secondary | ICD-10-CM | POA: Diagnosis not present

## 2021-04-15 DIAGNOSIS — R627 Adult failure to thrive: Secondary | ICD-10-CM | POA: Diagnosis not present

## 2021-04-15 DIAGNOSIS — R2689 Other abnormalities of gait and mobility: Secondary | ICD-10-CM | POA: Diagnosis not present

## 2021-04-15 DIAGNOSIS — I4819 Other persistent atrial fibrillation: Secondary | ICD-10-CM

## 2021-04-15 DIAGNOSIS — I4892 Unspecified atrial flutter: Secondary | ICD-10-CM | POA: Diagnosis not present

## 2021-04-15 DIAGNOSIS — E785 Hyperlipidemia, unspecified: Secondary | ICD-10-CM | POA: Diagnosis not present

## 2021-04-15 DIAGNOSIS — F418 Other specified anxiety disorders: Secondary | ICD-10-CM | POA: Diagnosis not present

## 2021-04-15 DIAGNOSIS — C3492 Malignant neoplasm of unspecified part of left bronchus or lung: Secondary | ICD-10-CM | POA: Diagnosis not present

## 2021-04-15 DIAGNOSIS — Z7401 Bed confinement status: Secondary | ICD-10-CM | POA: Diagnosis not present

## 2021-04-15 DIAGNOSIS — R531 Weakness: Secondary | ICD-10-CM | POA: Diagnosis not present

## 2021-04-15 DIAGNOSIS — D6869 Other thrombophilia: Secondary | ICD-10-CM | POA: Diagnosis not present

## 2021-04-15 DIAGNOSIS — E876 Hypokalemia: Secondary | ICD-10-CM | POA: Diagnosis not present

## 2021-04-15 DIAGNOSIS — R634 Abnormal weight loss: Secondary | ICD-10-CM | POA: Diagnosis not present

## 2021-04-15 DIAGNOSIS — I10 Essential (primary) hypertension: Secondary | ICD-10-CM | POA: Diagnosis not present

## 2021-04-15 DIAGNOSIS — C3490 Malignant neoplasm of unspecified part of unspecified bronchus or lung: Secondary | ICD-10-CM | POA: Diagnosis not present

## 2021-04-15 DIAGNOSIS — I2510 Atherosclerotic heart disease of native coronary artery without angina pectoris: Secondary | ICD-10-CM | POA: Diagnosis not present

## 2021-04-15 DIAGNOSIS — J90 Pleural effusion, not elsewhere classified: Secondary | ICD-10-CM | POA: Diagnosis not present

## 2021-04-15 DIAGNOSIS — Z09 Encounter for follow-up examination after completed treatment for conditions other than malignant neoplasm: Secondary | ICD-10-CM | POA: Diagnosis not present

## 2021-04-15 DIAGNOSIS — R1312 Dysphagia, oropharyngeal phase: Secondary | ICD-10-CM | POA: Diagnosis not present

## 2021-04-15 DIAGNOSIS — W19XXXA Unspecified fall, initial encounter: Secondary | ICD-10-CM | POA: Diagnosis not present

## 2021-04-15 DIAGNOSIS — G20 Parkinson's disease: Secondary | ICD-10-CM | POA: Diagnosis not present

## 2021-04-15 DIAGNOSIS — Z7901 Long term (current) use of anticoagulants: Secondary | ICD-10-CM | POA: Diagnosis not present

## 2021-04-15 DIAGNOSIS — M6259 Muscle wasting and atrophy, not elsewhere classified, multiple sites: Secondary | ICD-10-CM | POA: Diagnosis not present

## 2021-04-15 DIAGNOSIS — R278 Other lack of coordination: Secondary | ICD-10-CM | POA: Diagnosis not present

## 2021-04-15 DIAGNOSIS — J449 Chronic obstructive pulmonary disease, unspecified: Secondary | ICD-10-CM | POA: Diagnosis not present

## 2021-04-15 DIAGNOSIS — J9610 Chronic respiratory failure, unspecified whether with hypoxia or hypercapnia: Secondary | ICD-10-CM | POA: Diagnosis not present

## 2021-04-15 DIAGNOSIS — Z79899 Other long term (current) drug therapy: Secondary | ICD-10-CM | POA: Diagnosis not present

## 2021-04-15 DIAGNOSIS — R630 Anorexia: Secondary | ICD-10-CM | POA: Diagnosis not present

## 2021-04-15 DIAGNOSIS — E611 Iron deficiency: Secondary | ICD-10-CM | POA: Diagnosis not present

## 2021-04-15 DIAGNOSIS — R5381 Other malaise: Secondary | ICD-10-CM | POA: Diagnosis not present

## 2021-04-15 DIAGNOSIS — I4891 Unspecified atrial fibrillation: Secondary | ICD-10-CM | POA: Diagnosis not present

## 2021-04-15 DIAGNOSIS — E860 Dehydration: Secondary | ICD-10-CM | POA: Diagnosis not present

## 2021-04-15 DIAGNOSIS — R4189 Other symptoms and signs involving cognitive functions and awareness: Secondary | ICD-10-CM | POA: Diagnosis not present

## 2021-04-15 DIAGNOSIS — I484 Atypical atrial flutter: Secondary | ICD-10-CM | POA: Diagnosis not present

## 2021-04-15 DIAGNOSIS — J9611 Chronic respiratory failure with hypoxia: Secondary | ICD-10-CM | POA: Diagnosis not present

## 2021-04-15 DIAGNOSIS — R404 Transient alteration of awareness: Secondary | ICD-10-CM | POA: Diagnosis not present

## 2021-04-15 DIAGNOSIS — C3432 Malignant neoplasm of lower lobe, left bronchus or lung: Secondary | ICD-10-CM | POA: Diagnosis not present

## 2021-04-15 DIAGNOSIS — F4320 Adjustment disorder, unspecified: Secondary | ICD-10-CM | POA: Diagnosis not present

## 2021-04-15 DIAGNOSIS — I959 Hypotension, unspecified: Secondary | ICD-10-CM | POA: Diagnosis not present

## 2021-04-15 LAB — BASIC METABOLIC PANEL
Anion gap: 9 (ref 5–15)
BUN: 7 mg/dL — ABNORMAL LOW (ref 8–23)
CO2: 31 mmol/L (ref 22–32)
Calcium: 8.5 mg/dL — ABNORMAL LOW (ref 8.9–10.3)
Chloride: 96 mmol/L — ABNORMAL LOW (ref 98–111)
Creatinine, Ser: 0.6 mg/dL — ABNORMAL LOW (ref 0.61–1.24)
GFR, Estimated: >60 mL/min (ref >60)
Glucose, Bld: 122 mg/dL — ABNORMAL HIGH (ref 70–99)
Potassium: 3.8 mmol/L (ref 3.5–5.1)
Sodium: 136 mmol/L (ref 135–145)

## 2021-04-15 LAB — MAGNESIUM: Magnesium: 1.9 mg/dL (ref 1.7–2.4)

## 2021-04-15 MED ORDER — DIGOXIN 125 MCG PO TABS
0.1250 mg | ORAL_TABLET | Freq: Every day | ORAL | Status: DC
Start: 2021-04-15 — End: 2021-04-15

## 2021-04-15 MED ORDER — METOPROLOL TARTRATE 50 MG PO TABS
50.0000 mg | ORAL_TABLET | Freq: Two times a day (BID) | ORAL | Status: AC
Start: 2021-04-15 — End: ?

## 2021-04-15 MED ORDER — DIGOXIN 125 MCG PO TABS: 0.1250 mg | ORAL_TABLET | Freq: Every day | ORAL | Status: AC

## 2021-04-15 MED ORDER — AMIODARONE HCL 200 MG PO TABS: ORAL_TABLET | ORAL | 0 refills | Status: AC

## 2021-04-15 MED ORDER — HEPARIN SOD (PORK) LOCK FLUSH 100 UNIT/ML IV SOLN
500.0000 [IU] | INTRAVENOUS | Status: AC | PRN
  Administered 2021-04-15: 500 [IU]
  Filled 2021-04-15: qty 5

## 2021-04-15 NOTE — Progress Notes (Signed)
Progress Note  Patient Name: James [name] Date of Encounter: 04/15/2021  [facility] Cardiologist: [name], MD   Subjective   No chest pain, no SOB.    Inpatient Medications    Scheduled Meds:  (feeding supplement) PROSource Plus  30 mL Oral BID BM   acidophilus  1 capsule Oral QODAY   amiodarone  400 mg Oral BID   apixaban  5 mg Oral BID   arformoterol  15 mcg Nebulization Q12H   atorvastatin  20 mg Oral QHS   buPROPion  150 mg Oral Daily   Carbidopa-Levodopa ER  1.5 tablet Oral TID   Chlorhexidine Gluconate Cloth  6 each Topical Daily   donepezil  10 mg Oral QHS   famotidine  20 mg Oral Daily   feeding supplement  1 Container Oral TID BM   levalbuterol  0.63 mg Nebulization BID   lidocaine  2 patch Transdermal Daily   metoprolol tartrate  50 mg Oral BID   multivitamin with minerals  1 tablet Oral Daily   pantoprazole  40 mg Oral QPM   potassium chloride  40 mEq Oral Once   sertraline  150 mg Oral Daily   sodium chloride flush  10-40 mL Intracatheter Q12H   sodium chloride flush  3 mL Intravenous Q12H   umeclidinium bromide  1 puff Inhalation Daily   Continuous Infusions:  sodium chloride 10 mL/hr at 04/11/21 0529   PRN Meds: acetaminophen **OR** acetaminophen, lidocaine-prilocaine, loperamide, sodium chloride flush   Vital Signs    Vitals:   04/14/21 2042 04/15/21 0037 04/15/21 0419 04/15/21 0718  BP: (!) 110/49 99/60    Pulse: 75 73 (!) 103   Resp: 19 18    Temp: 98.9 F (37.2 C) 97.8 F (36.6 C) 97.8 F (36.6 C)   TempSrc: Axillary Oral Oral   SpO2: 99% 100%  96%  Weight:      Height:        Intake/Output Summary (Last 24 hours) at 04/15/2021 0756 Last data filed at 04/14/2021 1243 Gross per 24 hour  Intake --  Output 300 ml  Net -300 ml   Last 3 Weights 04/09/2021 04/03/2021 04/02/2021  Weight (lbs) 187 lb 6.3 oz 189 lb 2.5 oz 191 lb 12.8 oz  Weight (kg) 85 kg 85.8 kg 87 kg      Telemetry    A flutter, HR up at 0750 to 147  but now in 90s to 110s  - Personally Reviewed  ECG    No recent - Personally Reviewed  Physical Exam   GEN: No acute distress.   Neck: No JVD Cardiac: irreg irreg, no murmurs, rubs, or gallops.  Respiratory: Clear ant. to auscultation bilaterally. GI: Soft, nontender, non-distended  MS: No edema; No deformity. Neuro:  Nonfocal  Psych: Normal affect   Labs    High Sensitivity Troponin:  No results for input(s): TROPONINIHS in the last 720 hours.   Chemistry Recent Labs  Lab 04/09/21 0245 04/09/21 0850 04/13/21 0250 04/14/21 0305 04/15/21 0425  NA 137   < > 137 137 136  K 3.0*   < > 3.6 3.6 3.8  CL 96*   < > 99 98 96*  CO2 29   < > 30 33* 31  GLUCOSE 94   < > 125* 119* 122*  BUN 14   < > 7* 8 7*  CREATININE 0.87   < > 0.53* 0.48* 0.60*  CALCIUM 8.8*   < > 8.4* 8.2* 8.5*  MG  --    < > 1.8 1.7 1.9  PROT 5.1*  --   --   --   --   ALBUMIN 1.8*  --   --   --   --   AST 14*  --   --   --   --   ALT 9  --   --   --   --   ALKPHOS 70  --   --   --   --   BILITOT 0.2*  --   --   --   --   GFRNONAA >60   < > >60 >60 >60  ANIONGAP 12   < > 8 6 9    < > = values in this interval not displayed.    Lipids No results for input(s): CHOL, TRIG, HDL, LABVLDL, LDLCALC, CHOLHDL in the last 168 hours.  Hematology Recent Labs  Lab 04/10/21 0304 04/11/21 0634 04/12/21 0555  WBC 7.2 5.2 4.5  RBC 3.77* 3.74* 3.54*  HGB 9.5* 9.3* 8.8*  HCT 31.1* 30.8* 29.6*  MCV 82.5 82.4 83.6  MCH 25.2* 24.9* 24.9*  MCHC 30.5 30.2 29.7*  RDW 28.7* 28.9* 28.7*  PLT 172 183 198   Thyroid No results for input(s): TSH, FREET4 in the last 168 hours.  BNP Recent Labs  Lab 04/09/21 0246  BNP 177.3*    DDimer No results for input(s): DDIMER in the last 168 hours.   Radiology    No results found.  Cardiac Studies   TEE 03/04/21  IMPRESSIONS     1. Left ventricular ejection fraction, by estimation, is 55 to 60%. The  left ventricle has normal function.   2. Right ventricular systolic  function is normal. The right ventricular  size is mildly-to-moderately enlarged.   3. Left atrial size was moderately dilated. No left atrial/left atrial  appendage thrombus was detected.   4. Right atrial size was severely dilated.   5. The mitral valve is normal in structure. Mild mitral valve  regurgitation.   6. The aortic valve is tricuspid. There is mild calcification of the  aortic valve. There is mild thickening of the aortic valve. Aortic valve  regurgitation is mild. Aortic valve sclerosis/calcification is present,  without any evidence of aortic  stenosis.   7. There is mild (Grade II) plaque.   8. Following TEE, the patient underwent successful DCCV with return to  [facility].   FINDINGS   Left Ventricle: Left ventricular ejection fraction, by estimation, is 55  to 60%. The left ventricle has normal function. The left ventricular  internal cavity size was normal in size.   Right Ventricle: The right ventricular size is mildly-to-moderately  enlarged. No increase in right ventricular wall thickness. Right  ventricular systolic function is normal.   Left Atrium: Left atrial size was moderately dilated. No left atrial/left  atrial appendage thrombus was detected.   Right Atrium: Right atrial size was severely dilated.   Pericardium: There is no evidence of pericardial effusion.   Mitral Valve: The mitral valve is normal in structure. There is mild  thickening of the mitral valve leaflet(s). There is mild calcification of  the mitral valve leaflet(s). Mild mitral valve regurgitation.   Tricuspid Valve: The tricuspid valve is normal in structure. Tricuspid  valve regurgitation is mild.   Aortic Valve: The aortic valve is tricuspid. There is mild calcification  of the aortic valve. There is mild thickening of the aortic valve. Aortic  valve regurgitation is mild. Aortic  valve sclerosis/calcification is  present, without any evidence of  aortic stenosis.   Pulmonic Valve:  The pulmonic valve was normal in structure. Pulmonic valve  regurgitation is trivial.   Aorta: The aortic root is normal in size and structure. There is mild  (Grade II) plaque.   IAS/Shunts: No atrial level shunt detected by color flow Doppler.   Echo 02/12/2021 1. Left ventricular ejection fraction, by estimation, is 55 to 60%. The  left ventricle has normal function. The left ventricle has no regional  wall motion abnormalities. There is mild concentric left ventricular  hypertrophy. Left ventricular diastolic  function could not be evaluated.   2. Right ventricular systolic function is normal. The right ventricular  size is normal. There is normal pulmonary artery systolic pressure.   3. Left atrial size was mild to moderately dilated.   4. Right atrial size was mild to moderately dilated.   5. A small pericardial effusion is present. There is no evidence of  cardiac tamponade.   6. The mitral valve is normal in structure. No evidence of mitral valve  regurgitation. No evidence of mitral stenosis.   7. The aortic valve is grossly normal. Aortic valve regurgitation is not  visualized. No aortic stenosis is present.   8. There is mild dilatation of the ascending aorta, measuring 40 mm.   9. The inferior vena cava is dilated in size with >50% respiratory  variability, suggesting right atrial pressure of 8 mmHg.   Comparison(s): Changes from prior study are noted. Now in atrial flutter.        INTRAVASCULAR PRESSURE WIRE/FFR STUDY  01/22/21  LEFT HEART CATH AND CORONARY ANGIOGRAPHY    Conclusion       Mid LM to Dist LM lesion is 50% stenosed.   Prox RCA to Mid RCA lesion is 25% stenosed.   The left ventricular systolic function is normal.   LV end diastolic pressure is normal.   The left ventricular ejection fraction is 55-65% by visual estimate.   Nonobstructive CAD. There is a 50% distal left main stenosis but this is not hemodynamically significant by flow wire  assessment. Normal LV function Normal LVEDP   Plan: patient is cleared to proceed with evaluation and treatment of his lung mass. Risk factor modification.      Patient Profile     76 y.o. 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 And pl effusion.   Assessment & Plan      1.Atrial flutter - from afib clinic notes TEE/DCCV jan 2023. - recurrent aflutter this admission - 04/09/21 started on IV amio transitioned to po on the 18th.  plan for 400mg  bid x 7 days, then 200mg  bid, then 200mg  daily.  Lopressor changed from every 6 hours to eery 12 hours yesterday 50 BID -eliquis for stroke prevention -Patient may convert on amio alone. Ongoing palliative talks, depending on decisions down the road could consider repeat DCCV if remains in flutter. If decide on hospice then just rate control alone, could possibly stop amio at follow up if not working to convert him. If bp's an issue with rate control could consider digoxin. Lung disease on amio but overall poor prognosis with lung cancer and difficult to control aflutter I  remains reasonable option  (BP 258/52 to 778 systolic)  2.  Lung cancer/pl effusion/COPD/chronic respiratory failure with hypoxia --followed by oncology -palliative care consulted   3.  Iron def anemia-chronic/Parkinson's disease per IM -last Hgb 8.8  4.  Hypokalemia and hypo mag. Improved with replacement       For questions or updates, please contact [place] Please consult [email or web address] for contact info under        Signed, Cecilie Kicks, NP  04/15/2021, 7:56 AM

## 2021-04-15 NOTE — Progress Notes (Signed)
Nutrition Follow-up  DOCUMENTATION CODES:   Severe malnutrition in context of chronic illness  INTERVENTION:  - Continue Boost Breeze po TID, each supplement provides 250 kcal and 9 grams of protein   - Increase ProSource Plus 30 mL TID    - Continue MVI daily    - Continue Magic cup TID with meals, each supplement provides 290 kcal and 9 grams of protein  - Recommend liberalize diet if pt is not discharged to promote PO intake and provide additional protein/calories as current diet order limits these nutrients    NUTRITION DIAGNOSIS:   Severe Malnutrition related to chronic illness (stage IIIa non-small cell lung cancer) as evidenced by severe muscle depletion, energy intake < 75% for > or equal to 1 month, percent weight loss.  Ongoing   GOAL:   Patient will meet greater than or equal to 90% of their needs  Not met; addressing via PO intake and oral nutritional supplements   MONITOR:   PO intake, Supplement acceptance, Labs, Weight trends  REASON FOR ASSESSMENT:   Malnutrition Screening Tool    ASSESSMENT:   Pt is a 76 y.o. male with medical history significant for stage IIIa non-small cell lung cancer (recently completed radiation, chemotherapy held due to functional decline), chronic respiratory failure with hypoxia on 4 L O2 via nasal cannula, COPD, Parkinson's disease, T2DM, HTN, HLD, iron deficiency anemia, paroxysmal atrial flutter on Eliquis, depression, who presented to the ED from his facility due to severe tachycardia and admitted for the evaluation of atrial flutter.  No new meal documentation reported in chart. Dietetic intern spoke with nurse tech in regard to pt PO intake, but could not provide information if pt ate breakfast and the percentage today.   Per chart review, pt's diet switched to low sodium, heart healthy on today. Per chart review, pt is being discharged this afternoon. If pt is not discharged today, recommend liberalizing diet.   Met with  pt at bedside and wife's present during visit. Pt appears tired in bed, eyes opening and closing throughout visit and pt speaking very little to dietetic intern, but able to answer some questions. Per pt's wife, pt has been tolerating the Boost Breeze well and taking the Prosource BID. Pt's wife reports that he has been taking very small bits of food over the past weekend; such as bits of tuna salad and/or chicken salad sandwich. Per pt, pt reports that his appetite continues to come and go. Pt denies any nausea or vomiting. Pt reports that the metallic taste that he was experiencing prior has gotten better. Pt's wife reports that pt will be discharged today to [facility] facility and that they will be able to provide Boost Breeze and Prosource for him while he is there. Per conversation with pt's wife, pt continues to have poor PO intake, but tolerating nutritional supplements ordered well.   Encouraged pt's wife to continue to promote PO intake and providing oral nutritional supplements to help pt meet nutritional needs. Encouraged pt to try consuming small bits of food throughout the day when able to tolerate and eating larger amounts when he has a larger appetite and eating things that he enjoys. Dietetic intern to continue nutritional plan, but provide Prosource TID due to pt tolerating well and taking daily.   Medications reviewed and include: potassium chloride SA  Labs reviewed and include: K: 3.8  Diet Order:   Diet Order             Diet -  low sodium heart healthy           Diet regular Room service appropriate? Yes; Fluid consistency: Thin  Diet effective now                   EDUCATION NEEDS:   Not appropriate for education at this time  Skin:  Skin Assessment: Skin Integrity Issues: Skin Integrity Issues:: Stage II Stage II: coccyx  Last BM:  2/19  Height:   Ht Readings from Last 1 Encounters:  04/09/21 $RemoveB'6\' 1"'[id number]$  (1.854 m)    Weight:   Wt Readings from Last 1  Encounters:  04/09/21 85 kg    Ideal Body Weight:  83.6 kg  BMI:  Body mass index is 24.72 kg/m.  Estimated Nutritional Needs:   Kcal:  2100 - 2300  Protein:  105 - 120 grams  Fluid:  </= 2.1 L    Maryruth Hancock, Dietetic Intern 04/15/2021 1:02 PM

## 2021-04-15 NOTE — Progress Notes (Signed)
[facility] ([facility])  Hospital Liaison: RN note         This patient has been referred to our palliative care services in the community.  [facility] will continue to follow for any discharge planning needs and to coordinate continuation of palliative care in the outpatient setting.    If you have questions or need assistance, please call [phone] or contact the hospital Liaison listed on AMION.      Thank you for this referral.         Mary Anne Robertson, RN, CCM  [facility] Hospital Liaison   [phone] 

## 2021-04-15 NOTE — Plan of Care (Signed)

## 2021-04-15 NOTE — TOC Transition Note (Addendum)
Transition of Care [facility]) - CM/SW Discharge Note   Patient Details  Name: James [name] MRN: [id number] Date of Birth: 1945/11/10  [facility] CM/SW Contact:  Milas Gain, [place] Phone Number: 04/15/2021, 12:18 PM   Clinical Narrative:     Patient will DC to: [place]   Anticipated DC date: 04/15/2021  Family notified: [name]  Transport by: Corey Harold  ?  Per MD patient ready for DC to [facility] with palliative services to follow . RN, patient, patient's family, Latanya Presser with [facility] facility notified of DC. Discharge Summary sent to facility. RN given number for report tele# [phone] RM# [id number]. DC packet on chart. Ambulance transport requested for patient.  CSW signing off.   Final next level of care: Skilled Nursing Facility Barriers to Discharge: No Barriers Identified   Patient Goals and CMS Choice Patient states their goals for this hospitalization and ongoing recovery are:: SNF CMS Medicare.gov Compare Post Acute Care list provided to:: Patient Represenative (must comment) (CSW spoke with patient and patients spouse [name]) Choice offered to / list presented to : Spouse, Patient  Discharge Placement              Patient chooses bed at: [facility] Patient to be transferred to facility by: Z[place] Name of family member notified: Lucy Patient and family notified of of transfer: 04/15/21  Discharge Plan and Services In-house Referral: Clinical Social Work                                   Social Determinants of Health ([place]) Interventions     Readmission Risk Interventions No flowsheet data found.

## 2021-04-15 NOTE — Care Management Important Message (Signed)
Important Message  Patient Details  Name: James [name] MRN: [id number] Date of Birth: Jun 24, 1945   Medicare Important Message Given:  Yes     [name] 04/15/2021, 9:06 AM

## 2021-04-15 NOTE — Discharge Summary (Signed)
Physician Discharge Summary  James [name] SNK:[id number] DOB: 12-01-1945 DOA: 04/09/2021  PCP: [name], MD  Admit date: 04/09/2021 Discharge date: 04/15/2021  Admitted From: [facility] Disposition:  [facility]  Recommendations for Outpatient Follow-up:  Follow up with PCP in 1-2 weeks Follow-up with cardiology in 2 weeks Follow-up with medical oncology, Dr. Earlie Server Home Cardizem discontinued Continue amiodarone loading dose with taper Started on metoprolol tartrate 50 mg p.o. twice daily and digoxin Discharging with outpatient palliative care to follow, anticipate transitioning to hospice shortly thereafter given his poor outlook with advanced malignancy  Discharge Condition: Stable; but overall poor prognosis with advanced malignancy CODE STATUS: Full code Diet recommendation: Heart healthy/consistent carbohydrate diet  History of present illness:  James [name]  is a 76 y.o. male with past medical history significant for stage IIIa non-small cell lung cancer, recently completed radiation (follows with Dr. Julien Nordmann, chemotherapy held due to functional decline), COPD, chronic respiratory failure with hypoxia (on 4 L supplemental oxygen via nasal cannula at home), paroxysmal atrial flutter on Eliquis, Parkinson's disease, diabetes mellitus type 2, hypertension, hyperlipidemia, iron deficiency anemia, and depression who presented to [facility] ED from [facility] due to severe tachycardia.  Patient reports that he has been having increased shortness of breath.  He had been at the nursing facility for the last month and notes that is also around the time that he stopped receiving chemotherapy treatments.  Over the last 3 months he reports that he is lost approximately 50 pounds. Patient had been on Ceftin since 2/8.   In route with EMS heart rate was noted to be in the 150s.  In the ED patient was noted to be afebrile, heart rates elevated into the 150s in atrial flutter,  respirations elevated to 24, blood pressure soft with maps greater than 65, and O2 saturations otherwise maintained on 4 L.  Labs significant for hemoglobin 9.7, potassium 3 albumin 1.8, BNP 177.3.  Chest x-ray noted complete opacification of the left lung possibly representing moderate to large pleural effusion with atelectasis/infiltrate, or possibly postobstructive pneumonitis.  He had initially been given 500 mL of normal saline IV fluid bolus and started on Cardizem drip.  Hospitalist service consulted for further evaluation and management of atrial flutter with RVR.   Hospital course:  Assessment and Plan: * Atrial flutter with rapid ventricular response ([place])- (present on admission) Patient presented after being found to be in atrial flutter with heart rates into the 150s.  Once in the ED patient was started on a Cardizem drip with some improvement in heart rates.CHA2DS2-VASc score = 5.  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  2022 with LVEF 55-60%, mild concentric LVH, LA/RA moderately dilated, small pericardial effusion without tamponade, mild dilation ascending aorta measuring 40 mm, IVC dilated.  TSH 1.111 on 02/18/2021.  Cardiology was consulted and followed during hospital course.  Patient was initially started on amiodarone drip and now transition to 400mg  PO BID today x 7 days, followed by 200mg  BID x 2 weeks and 200mg  daily.  Also started on metoprolol tartrate 50 mg p.o. twice daily, and digoxin.  Continue anticoagulation with Eliquis 5 mg p.o. twice daily.  Discharging with outpatient palliative care to follow, with anticipation of transitioning to hospice care in the near future.  Pleural effusion- (present on admission) Patient presented with opacification of the left lung field.  Thought possibly secondary to malignant pleural effusion versus secondary to patient's rapid  A-fib. s/p IR left thoracentesis 2/15 with  removal 2 L amber/blood-tinged fluid with repeat chest x-ray thereafter showing continued complete opacification of the left hemithorax.  CT chest with contrast 2/15 with complete opacification left hemithorax with abrupt cut off left mainstem bronchus concerning for mucous plug versus tumor invasion.  Pleural fluid studies consistent with exudative effusion and cytology with no malignant cells, although finding malignant cells and pleural fluid is low yield.  Pulmonology was consulted for consideration of bronchoscopy, given his progressive malignancy, poor health recommended supportive care and comfort measures. Evaluated by palliative care with plan to be followed by palliative care on discharge with eventual transition to hospice  Primary cancer of left lower lobe of lung ([place])- (present on admission) Follows with medical oncology outpatient, Dr. Julien Nordmann.  Despite thoracentesis, patient continues with persistent left sided opacification concerning for worsening/progressive malignancy.  Chemotherapy has been on hold due to functional decline per oncology notes. Plan outpatient palliative care to follow on discharge  COPD with emphysema ([place])- (present on admission) Home regimen includes albuterol MDI as needed, Spiriva, formoterol neb twice daily, Xopenex neb 3 times daily as needed.  On 4 L nasal cannula at baseline.   Chronic respiratory failure with hypoxia ([place])- (present on admission) Continues to maintain adequate oxygenation on home 4 L of oxygen.  Hypokalemia- (present on admission) Repleted during hospitalization.  Hyperlipidemia associated with type 2 diabetes mellitus ([place])- (present on admission) Patient has known history of diabetes, but is well controlled last hemoglobin A1c was 5 on 2/5.  Admission glucose 94.Marland Kitchen  Home medication regimen includes metformin 500 mg twice daily and atorvastatin 20 mg nightly. Continue atorvastatin 20 mg p.o. daily and metformin 500 mg p.o. twice  daily.  Iron deficiency anemia- (present on admission) Chronic.  Hemoglobin stable and at baseline.  Parkinson's disease ([place])- (present on admission) Continue Sinemet CR 25-100mg  1.5 tablets TID, Donepezil 10 mg p.o. nightly  Protein-calorie malnutrition, severe ([place])- (present on admission) Body mass index is 24.72 kg/m. On admission albumin was noted to be low at 1.8. Nutrition Status: Nutrition Problem: Severe Malnutrition Etiology: chronic illness (stage IIIa non-small cell lung cancer) Signs/Symptoms: severe muscle depletion, energy intake < 75% for > or equal to 1 month, percent weight loss Percent weight loss: 6 % (in one month) Interventions: Boost Breeze, Liberalize Diet, MVI, Prostat Dietitian was consulted and followed during hospital course, continue to encourage increased oral intake, supplementation.  Depression- (present on admission) Continue home Wellbutrin 150 mg p.o. daily, Sertraline 150 mg p.o. daily  Hypomagnesemia; hypokalemia- (present on admission) Repleted during hospitalization.  GERD (gastroesophageal reflux disease)- (present on admission) Continue home Pepcid, omeprazole.       Discharge Diagnoses:  Principal Problem:   Atrial flutter with rapid ventricular response (HCC) Active Problems:   Pleural effusion   Primary cancer of left lower lobe of lung (HCC)   COPD with emphysema (HCC)   Chronic respiratory failure with hypoxia (HCC)   Hypokalemia   Hyperlipidemia associated with type 2 diabetes mellitus (HCC)   Iron deficiency anemia   Parkinson's disease ([place])   Protein-calorie malnutrition, severe (HCC)   Depression   Hypomagnesemia; hypokalemia   GERD (gastroesophageal reflux disease)    Discharge Instructions  Discharge Instructions     Call MD for:  difficulty breathing, headache or visual disturbances   Complete by: As directed    Call MD for:  extreme fatigue   Complete by: As directed    Call MD for:  persistant  dizziness  or light-headedness   Complete by: As directed    Call MD for:  persistant nausea and vomiting   Complete by: As directed    Call MD for:  severe uncontrolled pain   Complete by: As directed    Call MD for:  temperature >100.4   Complete by: As directed    Diet - low sodium heart healthy   Complete by: As directed    Discharge wound care:   Complete by: As directed    Continue local wound care to stage II coccyx pressure injury with frequent offloading   Increase activity slowly   Complete by: As directed       Allergies as of 04/15/2021       Reactions   Aspirin Other (See Comments)   GI bleed   Codeine Itching   Morphine Itching, Nausea Only        Medication List     STOP taking these medications    cefUROXime 250 MG tablet Commonly known as: CEFTIN   diltiazem 300 MG 24 hr capsule Commonly known as: CARDIZEM CD       TAKE these medications    acetaminophen 325 MG tablet Commonly known as: TYLENOL Take 2 tablets (650 mg total) by mouth every 6 (six) hours as needed for mild pain (or Fever >/= 101). What changed: when to take this   albuterol 108 (90 Base) MCG/ACT inhaler Commonly known as: VENTOLIN HFA Inhale 2 puffs into the lungs every 6 (six) hours as needed for wheezing or shortness of breath.   amiodarone 200 MG tablet Commonly known as: Pacerone Take 2 tablets (400 mg total) by mouth 2 (two) times daily for 5 days, THEN 1 tablet (200 mg total) 2 (two) times daily for 14 days, THEN 1 tablet (200 mg total) daily. Start taking on: April 15, 2021   apixaban 5 MG Tabs tablet Commonly known as: ELIQUIS Take 1 tablet (5 mg total) by mouth 2 (two) times daily.   atorvastatin 20 MG tablet Commonly known as: LIPITOR Take 20 mg by mouth at bedtime.   buPROPion 150 MG 24 hr tablet Commonly known as: WELLBUTRIN XL Take 150 mg by mouth daily.   calcium carbonate 500 MG chewable tablet Commonly known as: TUMS - dosed in mg elemental  calcium Chew 1 tablet by mouth daily.   calcium-vitamin D 250-125 MG-UNIT tablet Commonly known as: OSCAL WITH D Take 1 tablet by mouth daily.   Carbidopa-Levodopa ER 25-100 MG tablet controlled release Commonly known as: SINEMET CR Take 1.5 tablets by mouth in the morning, at noon, and at bedtime. Take at 0800, 1400 & 2200   cetirizine 10 MG tablet Commonly known as: ZYRTEC Take 10 mg by mouth daily.   digoxin 0.125 MG tablet Commonly known as: LANOXIN Take 1 tablet (0.125 mg total) by mouth daily.   donepezil 10 MG tablet Commonly known as: ARICEPT Take 1 tablet (10 mg total) by mouth at bedtime.   famotidine 20 MG tablet Commonly known as: PEPCID Take 20 mg by mouth daily.   ferrous gluconate 324 MG tablet Commonly known as: FERGON TAKE 1 TABLET BY MOUTH DAILY WITH BREAKFAST   Fish Oil 1000 MG Caps Take 1 capsule by mouth daily.   formoterol 20 MCG/2ML nebulizer solution Commonly known as: PERFOROMIST Take 2 mLs (20 mcg total) by nebulization 2 (two) times daily.   Green Tea 315 MG Caps Take 315 mg by mouth daily.   hydrOXYzine 10 MG tablet Commonly known  as: ATARAX Take 5 mg by mouth 2 (two) times daily.   ibuprofen 800 MG tablet Commonly known as: ADVIL Take 1 tablet (800 mg total) by mouth every 8 (eight) hours as needed for moderate pain.   levalbuterol 0.63 MG/3ML nebulizer solution Commonly known as: XOPENEX Take 0.63 mg by nebulization in the morning, at noon, and at bedtime.   Lidocaine 4 % Ptch Apply 2 patches topically daily.   lidocaine-prilocaine cream Commonly known as: EMLA Apply 1 application topically as needed. What changed: reasons to take this   MEGARED OMEGA-3 KRILL OIL PO Take 1 capsule by mouth daily.   metFORMIN 500 MG tablet Commonly known as: GLUCOPHAGE Take 1 tablet (500 mg total) by mouth 2 (two) times daily with a meal.   metoprolol tartrate 50 MG tablet Commonly known as: LOPRESSOR Take 1 tablet (50 mg total) by  mouth 2 (two) times daily. What changed:  medication strength how much to take additional instructions   mirtazapine 15 MG tablet Commonly known as: REMERON Take 15 mg by mouth at bedtime.   modafinil 100 MG tablet Commonly known as: PROVIGIL Take 1 tablet (100 mg total) by mouth daily.   multivitamin with minerals tablet Take 1 tablet by mouth daily. Centrum   nitroGLYCERIN 0.4 MG SL tablet Commonly known as: NITROSTAT Place 0.4 mg under the tongue every 5 (five) minutes as needed for chest pain.   omeprazole 20 MG tablet Commonly known as: PRILOSEC OTC Take 20 mg by mouth every evening.   OXYGEN Inhale 4-6 L into the lungs continuous.   Pro-Stat Liqd Take 30 mLs by mouth 2 (two) times daily.   Probiotic Daily Caps Take 1 capsule by mouth every other day.   prochlorperazine 10 MG tablet Commonly known as: COMPAZINE Take 1 tablet (10 mg total) by mouth every 6 (six) hours as needed for nausea or vomiting.   sertraline 50 MG tablet Commonly known as: ZOLOFT Take 50 mg by mouth daily.   sertraline 100 MG tablet Commonly known as: ZOLOFT Take 100 mg by mouth daily.   Spiriva Respimat 2.5 MCG/ACT Aers Generic drug: Tiotropium Bromide Monohydrate Inhale 2 puffs into the lungs daily.   vitamin C 500 MG tablet Commonly known as: ASCORBIC ACID Take 500 mg by mouth daily.   Vitamin D-3 25 MCG (1000 UT) Caps Take 1 capsule by mouth daily.               Discharge Care Instructions  (From admission, onward)           Start     Ordered   04/15/21 0000  Discharge wound care:       Comments: Continue local wound care to stage II coccyx pressure injury with frequent offloading   04/15/21 1106            Follow-up Information     [name], MD. Schedule an appointment as soon as possible for a visit in 1 week(s).   Specialty: Internal Medicine Contact information: [address] [place] [address] [phone]          [name], MD. Schedule an appointment as soon as possible for a visit in 2 week(s).   Specialties: Cardiology, Radiology Contact information: [address] [place] [address] [phone]                Allergies  Allergen Reactions   Aspirin Other (See Comments)    GI bleed   Codeine  Itching   Morphine Itching and Nausea Only    Consultations: Cardiology Palliative care PCCM   Procedures/Studies: DG Chest 1 View  Result Date: 04/10/2021 CLINICAL DATA:  Status post left thoracentesis. EXAM: CHEST  1 VIEW COMPARISON:  April 09, 2021. FINDINGS: Stable complete opacification of left hemithorax most likely due to combination of atelectasis and effusion. No definite pneumothorax is noted. IMPRESSION: Stable complete opacification of left hemithorax most likely due to combination of atelectasis and effusion. No definite pneumothorax is noted status post left thoracentesis. Electronically Signed   By: Marijo Conception M.D.   On: 04/10/2021 12:27   CT HEAD WO CONTRAST  Result Date: 03/29/2021 CLINICAL DATA:  Altered mental status EXAM: CT HEAD WITHOUT CONTRAST TECHNIQUE: Contiguous axial images were obtained from the base of the skull through the vertex without intravenous contrast. RADIATION DOSE REDUCTION: This exam was performed according to the departmental dose-optimization program which includes automated exposure control, adjustment of the mA and/or kV according to patient size and/or use of iterative reconstruction technique. COMPARISON:  MRI 02/01/2021, CT 06/17/2019 FINDINGS: Brain: Normal anatomic configuration. Parenchymal volume loss is commensurate with the patient's age. Mild periventricular white matter changes are present likely reflecting the sequela of small vessel ischemia. No abnormal intra or extra-axial mass lesion or fluid collection. No abnormal mass effect or midline shift. No evidence of acute intracranial hemorrhage or infarct. Mild  ventriculomegaly is stable and appears commensurate with the degree of parenchymal volume loss. Cerebellum unremarkable. Vascular: Prominence of the M1 segment of the left MCA is stable and unchanged from prior MRI examination. Otherwise, no asymmetric hyperdense vasculature at the skull base. Skull: Intact Sinuses/Orbits: Paranasal sinuses are clear. Ocular lenses have been removed. Orbits are otherwise unremarkable. Other: Mastoid air cells and middle ear cavities are clear. IMPRESSION: No acute intracranial abnormality. Stable senescent change.  Stable mild ventriculomegaly. Electronically Signed   By: Fidela Salisbury M.D.   On: 03/29/2021 19:02   CT CHEST W CONTRAST  Result Date: 04/10/2021 CLINICAL DATA:  Pleural effusion, suspected malignancy EXAM: CT CHEST WITH CONTRAST TECHNIQUE: Multidetector CT imaging of the chest was performed during intravenous contrast administration. RADIATION DOSE REDUCTION: This exam was performed according to the departmental dose-optimization program which includes automated exposure control, adjustment of the mA and/or kV according to patient size and/or use of iterative reconstruction technique. CONTRAST:  31mL OMNIPAQUE IOHEXOL 300 MG/ML  SOLN COMPARISON:  11/17/2020 FINDINGS: Cardiovascular: Atherosclerotic calcifications aorta and coronary arteries. Aorta normal caliber. Vascular structures grossly patent. Heart unremarkable. No pericardial effusion. Mediastinum/Nodes: Esophagus normal appearance. Diffusely heterogeneous thyroid lobes with multiple nodules up to 15 mm diameter; This has been evaluated on previous imaging, thyroid ultrasound 02/14/2021. (Ref: J Am Coll Radiol. 2015 Feb;12(2): 143-50). Base of cervical region otherwise normal appearance. Few scattered normal size mediastinal lymph nodes without thoracic adenopathy. Lungs/Pleura: Complete opacification of the LEFT hemithorax by large pleural effusion. Complete atelectasis of LEFT lung. No definite pleural  thickening or mass. No discrete pulmonary mass identified. Low-attenuation in LEFT lower lobe question drowned lung. Again identified abrupt cut off of air within LEFT mainstem bronchus, may be related to mucous plugging though cannot exclude tumor with this appearance. Mucus within RIGHT mainstem bronchus. Underlying emphysematous changes in RIGHT lung. Subpleural area of pleural thickening versus nodularity posterior RIGHT upper lobe 18 x 7 mm image 46, minimally more prominent than on previous exam. Peribronchial thickening with subsegmental atelectasis in RIGHT middle lobe and RIGHT lower lobe. No RIGHT lung infiltrate, pleural effusion,  or pneumothorax. Upper Abdomen: Diffuse thickening of the adrenal glands. Minimal diverticulosis at splenic flexure of colon. Remaining visualized upper abdomen unremarkable. Musculoskeletal: Diffuse osseous demineralization. IMPRESSION: Complete opacification of the LEFT hemithorax by large pleural effusion with complete atelectasis of LEFT lung and question drowned lung appearance of particularly the LEFT lower lobe. Again identified abrupt cut off of air within LEFT mainstem bronchus, may be related to mucous plugging though cannot exclude tumor with this appearance. Subpleural area of pleural thickening versus nodularity posterior RIGHT upper lobe 18 x 7 mm, minimally more prominent than on previous exam; recommend continued attention on follow-up imaging. Diffuse thickening of the adrenal glands bilaterally question adrenal hyperplasia. Coronary arterial calcifications. Diffusely heterogeneous thyroid lobes with multiple nodules up to 15 mm diameter; This has been evaluated on previous imaging, thyroid ultrasound 02/14/2021. Aortic Atherosclerosis (ICD10-I70.0) and Emphysema (ICD10-J43.9). Electronically Signed   By: Lavonia Dana M.D.   On: 04/10/2021 16:19   DG Chest Port 1 View  Result Date: 04/09/2021 CLINICAL DATA:  Flutter, weakness, tachycardia. EXAM: PORTABLE  CHEST 1 VIEW COMPARISON:  03/29/2021. FINDINGS: The heart size and mediastinal contours are obscured. There is atherosclerotic calcification of the aorta. There is complete opacification of the left lung. Mild atelectasis is noted at the right lung base. No pneumothorax. A stable right chest port is noted. No acute osseous abnormality. IMPRESSION: Complete opacification of the left lung, possibly representing a moderate to large pleural effusion with atelectasis or infiltrate. Findings may be associated with postobstructive pneumonitis related to known left perihilar mass. Electronically Signed   By: Brett Fairy M.D.   On: 04/09/2021 03:03   DG Chest Portable 1 View  Result Date: 03/29/2021 CLINICAL DATA:  Generalized weakness. EXAM: PORTABLE CHEST 1 VIEW COMPARISON:  Chest x-rays dated 02/18/2021 and 02/11/2021. Chest CT dated 11/17/2020. PET-CT dated 12/26/2020. FINDINGS: Heart size and mediastinal contours are grossly stable. 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 RIGHT lung is clear. RIGHT chest wall Port-A-Cath in place with tip adequately positioned at the level of the mid/lower SVC. Osseous structures about the chest are unremarkable. IMPRESSION: 1. No significant change compared to the chest x-ray of 02/18/2021. Again noted is a LEFT pleural effusion and airspace opacities throughout the LEFT lung with volume loss, compatible with the postobstructive changes related to the LEFT perihilar mass as demonstrated on earlier chest CT and PET-CT. 2. No new findings seen.  RIGHT lung is clear. Electronically Signed   By: Franki Cabot M.D.   On: 03/29/2021 18:37   IR IMAGING GUIDED PORT INSERTION  Result Date: 03/18/2021 INDICATION: 76 year old male referred for port catheter EXAM: IMAGE GUIDED PORT CATHETER  MEDICATIONS: None ANESTHESIA/SEDATION: Moderate (conscious) sedation was employed during this procedure. A total of Versed 1.0 mg and Fentanyl 50 mcg was administered intravenously. Moderate Sedation Time: 20 minutes. The patient's level of consciousness and vital signs were monitored continuously by radiology nursing throughout the procedure under my direct supervision. FLUOROSCOPY TIME:  Fluoroscopy Time: 0 minutes 18 seconds (2 mGy). COMPLICATIONS: None PROCEDURE: The procedure, risks, benefits, and alternatives were explained to the patient. Questions regarding the procedure were encouraged and answered. The patient understands and consents to the procedure. Ultrasound survey was performed with images stored and sent to PACs. Right IJ vein documented to be patent. The right neck and chest was  prepped with chlorhexidine, and draped in the usual sterile fashion using maximum barrier technique (cap and mask, sterile gown, sterile gloves, large sterile sheet, hand hygiene and cutaneous antiseptic). Local anesthesia was attained by infiltration with 1% lidocaine without epinephrine. Ultrasound demonstrated patency of the right internal jugular vein, and this was documented with an image. Under real-time ultrasound guidance, this vein was accessed with a 21 gauge micropuncture needle and image documentation was performed. A small dermatotomy was made at the access site with an 11 scalpel. A 0.018" wire was advanced into the SVC and used to estimate the length of the internal catheter. The access needle exchanged for a 60F micropuncture vascular sheath. The 0.018" wire was then removed and a 0.035" wire advanced into the IVC. An appropriate location for the subcutaneous reservoir was selected below the clavicle and an incision was made through the skin and underlying soft tissues. The subcutaneous tissues were then dissected using a combination of blunt and sharp surgical technique and a pocket was formed. A single  lumen power injectable portacatheter was then tunneled through the subcutaneous tissues from the pocket to the dermatotomy and the port reservoir placed within the subcutaneous pocket. The venous access site was then serially dilated and a peel away vascular sheath placed over the wire. The wire was removed and the port catheter advanced into position under fluoroscopic guidance. The catheter tip is positioned in the cavoatrial junction. This was documented with a spot image. The portacatheter was then tested and found to flush and aspirate well. The port was flushed with saline followed by 100 units/mL heparinized saline. The pocket was then closed in two layers using first subdermal inverted interrupted absorbable sutures followed by a running subcuticular suture. The epidermis was then sealed with Dermabond. The dermatotomy at the venous access site was also seal with Dermabond. Patient tolerated the procedure well and remained hemodynamically stable throughout. No complications encountered and no significant blood loss encountered IMPRESSION: Status post right IJ port catheter Signed, Dulcy Fanny. Dellia Nims, RPVI [facility] Radiology Electronically Signed   By: Corrie Mckusick D.O.   On: 03/18/2021 16:18   IR THORACENTESIS ASP PLEURAL SPACE W/IMG GUIDE  Result Date: 04/10/2021 INDICATION: Patient with history of non-small cell lung cancer, COPD, Parkinson's disease, atrial flutter, left pleural effusion. Request received for diagnostic and therapeutic left thoracentesis. EXAM: ULTRASOUND GUIDED DIAGNOSTIC AND THERAPEUTIC LEFT THORACENTESIS MEDICATIONS: 10 ml 1% lidocaine COMPLICATIONS: None immediate. PROCEDURE: An ultrasound guided thoracentesis was thoroughly discussed with the patient and questions answered. The benefits, risks, alternatives and complications were also discussed. The patient understands and wishes to proceed with the procedure. Written consent  was obtained. Ultrasound was performed to localize and mark an adequate pocket of fluid in the left chest. The area was then prepped and draped in the normal sterile fashion. 1% Lidocaine was used for local anesthesia. Under ultrasound guidance a 6 Fr Safe-T-Centesis catheter was introduced. Thoracentesis was performed. The catheter was removed and a dressing applied. FINDINGS: A total of approximately 2 liters of amber/blood-tinged fluid was removed. Samples were sent to the laboratory as requested by the clinical team. IMPRESSION: Successful ultrasound guided diagnostic and therapeutic left thoracentesis yielding 2 liters of pleural fluid. Read by: Rowe Robert, PA-C Electronically Signed   By: Corrie Mckusick D.O.   On: 04/10/2021 13:25     Subjective: Patient seen examined at bedside, resting comfortably.  No family present.  Discharging to SNF today; excited to leave the hospital  and declines any aggressive measures at this time.  No other questions or concerns at this time.  Denies headache, no dizziness, no chest pain, no shortness of breath more than his typical baseline, no fever/chills/night sweats, no nausea/vomiting/diarrhea, no abdominal pain, no paresthesias.  No acute events overnight per nursing staff.  Discharge Exam: Vitals:   04/15/21 0718 04/15/21 0820  BP:  (!) 115/55  Pulse:  (!) 101  Resp:    Temp:    SpO2: 96%    Vitals:   04/15/21 0037 04/15/21 0419 04/15/21 0718 04/15/21 0820  BP: 99/60   (!) 115/55  Pulse: 73 (!) 103  (!) 101  Resp: 18     Temp: 97.8 F (36.6 C) 97.8 F (36.6 C)    TempSrc: Oral Oral    SpO2: 100%  96%   Weight:      Height:        Physical Exam: GEN: NAD, alert and oriented x 3, chronically ill/cachectic in appearance, appears older than stated age HEENT: NCAT, PERRL, EOMI, sclera clear, MMM PULM: Breath sounds absent left hemithorax, breath sounds slightly decreased right base, no crackles/wheezing, normal [place] effort on 4 L nasal  cannula which is his baseline. CV: Irregularly irregular rhythm, w/o M/G/R GI: abd soft, NTND, NABS, no R/G/M MSK: no peripheral edema, muscle strength globally intact 5/5 bilateral upper/lower extremities NEURO: CN II-XII intact, no focal deficits, sensation to light touch intact PSYCH: normal mood/affect Integumentary: Stage II coccyx pressure injury, otherwise no other concerning rashes/lesions/wounds    The results of significant diagnostics from this hospitalization (including imaging, microbiology, ancillary and laboratory) are listed below for reference.     Microbiology: Recent Results (from the past 240 hour(s))  Body fluid culture w Gram Stain     Status: None   Collection Time: 04/10/21 12:12 PM   Specimen: Thoracic  Result Value Ref Range Status   Specimen Description THORACIC  Final   Special Requests LUNG LEFT  Final   Gram Stain   Final    FEW WBC PRESENT,BOTH PMN AND MONONUCLEAR NO ORGANISMS SEEN RESULT CALLED TO, READ BACK BY AND VERIFIED WITH: P.KOSHWAH AT 1510 ON 04/10/2021 BY T.SAAD.    Culture   Final    NO GROWTH 3 DAYS Performed at [facility], [place] [address], [place], [place] [address]    Report Status 04/13/2021 FINAL  Final  Resp Panel by RT-PCR (Flu A&B, Covid) Nasopharyngeal Swab     Status: None   Collection Time: 04/14/21  2:17 PM   Specimen: Nasopharyngeal Swab; Nasopharyngeal(NP) swabs in vial transport medium  Result Value Ref Range Status   SARS Coronavirus 2 by RT PCR NEGATIVE NEGATIVE Final    Comment: (NOTE) SARS-CoV-2 target nucleic acids are NOT DETECTED.  The SARS-CoV-2 RNA is generally detectable in upper respiratory specimens during the acute phase of infection. The lowest concentration of SARS-CoV-2 viral copies this assay can detect is 138 copies/mL. A negative result does not preclude SARS-Cov-2 infection and should not be used as the sole basis for treatment or other patient management decisions. A negative result may  occur with  improper specimen collection/handling, submission of specimen other than nasopharyngeal swab, presence of viral mutation(s) within the areas targeted by this assay, and inadequate number of viral copies(<138 copies/mL). A negative result must be combined with clinical observations, patient history, and epidemiological information. The expected result is Negative.  Fact Sheet for Patients:  EntrepreneurPulse.com.au  Fact Sheet for Healthcare Providers:  IncredibleEmployment.be  This test is no t yet approved or cleared by the [place] and  has been authorized for detection and/or diagnosis of SARS-CoV-2 by FDA under an Emergency Use Authorization (EUA). This EUA will remain  in effect (meaning this test can be used) for the duration of the COVID-19 declaration under Section 564(b)(1) of the Act, 21 U.S.C.section 360bbb-3(b)(1), unless the authorization is terminated  or revoked sooner.       Influenza A by PCR NEGATIVE NEGATIVE Final   Influenza B by PCR NEGATIVE NEGATIVE Final    Comment: (NOTE) The Xpert Xpress SARS-CoV-2/FLU/RSV plus assay is intended as an aid in the diagnosis of influenza from Nasopharyngeal swab specimens and should not be used as a sole basis for treatment. Nasal washings and aspirates are unacceptable for Xpert Xpress SARS-CoV-2/FLU/RSV testing.  Fact Sheet for Patients: EntrepreneurPulse.com.au  Fact Sheet for Healthcare Providers: IncredibleEmployment.be  This test is not yet approved or cleared by the [place] FDA and has been authorized for detection and/or diagnosis of SARS-CoV-2 by FDA under an Emergency Use Authorization (EUA). This EUA will remain in effect (meaning this test can be used) for the duration of the COVID-19 declaration under Section 564(b)(1) of the Act, 21 U.S.C. section 360bbb-3(b)(1), unless the authorization is terminated  or revoked.  Performed at [facility], [place] [address] [place], [place] [address]      Labs: BNP (last 3 results) Recent Labs    02/11/21 2225 02/18/21 1906 04/09/21 0246  BNP 288.6* 91.6 604.5*   Basic Metabolic Panel: Recent Labs  Lab 04/12/21 0555 04/12/21 0659 04/13/21 0250 04/14/21 0305 04/15/21 0425  NA 121* 137 137 137 136  K 2.8* 3.5 3.6 3.6 3.8  CL 77* 98 99 98 96*  CO2 24 32 30 33* 31  GLUCOSE 934* 131* 125* 119* 122*  BUN 6* 6* 7* 8 7*  CREATININE 0.62 0.48* 0.53* 0.48* 0.60*  CALCIUM 6.4* 8.2* 8.4* 8.2* 8.5*  MG 1.4* 1.7 1.8 1.7 1.9   Liver Function Tests: Recent Labs  Lab 04/09/21 0245  AST 14*  ALT 9  ALKPHOS 70  BILITOT 0.2*  PROT 5.1*  ALBUMIN 1.8*   No results for input(s): LIPASE, AMYLASE in the last 168 hours. No results for input(s): AMMONIA in the last 168 hours. CBC: Recent Labs  Lab 04/09/21 0245 04/10/21 0304 04/11/21 0634 04/12/21 0555  WBC 10.3 7.2 5.2 4.5  HGB 9.7* 9.5* 9.3* 8.8*  HCT 32.6* 31.1* 30.8* 29.6*  MCV 82.7 82.5 82.4 83.6  PLT 216 172 183 198   Cardiac Enzymes: No results for input(s): CKTOTAL, CKMB, CKMBINDEX, TROPONINI in the last 168 hours. BNP: Invalid input(s): POCBNP CBG: No results for input(s): GLUCAP in the last 168 hours. D-Dimer No results for input(s): DDIMER in the last 72 hours. Hgb A1c No results for input(s): HGBA1C in the last 72 hours. Lipid Profile No results for input(s): CHOL, HDL, LDLCALC, TRIG, CHOLHDL, LDLDIRECT in the last 72 hours. Thyroid function studies No results for input(s): TSH, T4TOTAL, T3FREE, THYROIDAB in the last 72 hours.  Invalid input(s): FREET3 Anemia work up No results for input(s): VITAMINB12, FOLATE, FERRITIN, TIBC, IRON, RETICCTPCT in the last 72 hours. Urinalysis    Component Value Date/Time   COLORURINE AMBER (A) 04/09/2021 0719   APPEARANCEUR HAZY (A) 04/09/2021 0719   LABSPEC 1.024 04/09/2021 0719   PHURINE 5.0 04/09/2021 0719   GLUCOSEU  NEGATIVE 04/09/2021 0719   HGBUR NEGATIVE 04/09/2021 0719   BILIRUBINUR NEGATIVE 04/09/2021  0719   KETONESUR 5 (A) 04/09/2021 0719   PROTEINUR 30 (A) 04/09/2021 0719   NITRITE NEGATIVE 04/09/2021 0719   LEUKOCYTESUR TRACE (A) 04/09/2021 0719   Sepsis Labs Invalid input(s): PROCALCITONIN,  WBC,  LACTICIDVEN Microbiology Recent Results (from the past 240 hour(s))  Body fluid culture w Gram Stain     Status: None   Collection Time: 04/10/21 12:12 PM   Specimen: Thoracic  Result Value Ref Range Status   Specimen Description THORACIC  Final   Special Requests LUNG LEFT  Final   Gram Stain   Final    FEW WBC PRESENT,BOTH PMN AND MONONUCLEAR NO ORGANISMS SEEN RESULT CALLED TO, READ BACK BY AND VERIFIED WITH: P.KOSHWAH AT 1510 ON 04/10/2021 BY T.SAAD.    Culture   Final    NO GROWTH 3 DAYS Performed at [facility], [place] [address], [place], [place] [address]    Report Status 04/13/2021 FINAL  Final  Resp Panel by RT-PCR (Flu A&B, Covid) Nasopharyngeal Swab     Status: None   Collection Time: 04/14/21  2:17 PM   Specimen: Nasopharyngeal Swab; Nasopharyngeal(NP) swabs in vial transport medium  Result Value Ref Range Status   SARS Coronavirus 2 by RT PCR NEGATIVE NEGATIVE Final    Comment: (NOTE) SARS-CoV-2 target nucleic acids are NOT DETECTED.  The SARS-CoV-2 RNA is generally detectable in upper respiratory specimens during the acute phase of infection. The lowest concentration of SARS-CoV-2 viral copies this assay can detect is 138 copies/mL. A negative result does not preclude SARS-Cov-2 infection and should not be used as the sole basis for treatment or other patient management decisions. A negative result may occur with  improper specimen collection/handling, submission of specimen other than nasopharyngeal swab, presence of viral mutation(s) within the areas targeted by this assay, and inadequate number of viral copies(<138 copies/mL). A negative result must be combined  with clinical observations, patient history, and epidemiological information. The expected result is Negative.  Fact Sheet for Patients:  EntrepreneurPulse.com.au  Fact Sheet for Healthcare Providers:  IncredibleEmployment.be  This test is no t yet approved or cleared by the [place] FDA and  has been authorized for detection and/or diagnosis of SARS-CoV-2 by FDA under an Emergency Use Authorization (EUA). This EUA will remain  in effect (meaning this test can be used) for the duration of the COVID-19 declaration under Section 564(b)(1) of the Act, 21 U.S.C.section 360bbb-3(b)(1), unless the authorization is terminated  or revoked sooner.       Influenza A by PCR NEGATIVE NEGATIVE Final   Influenza B by PCR NEGATIVE NEGATIVE Final    Comment: (NOTE) The Xpert Xpress SARS-CoV-2/FLU/RSV plus assay is intended as an aid in the diagnosis of influenza from Nasopharyngeal swab specimens and should not be used as a sole basis for treatment. Nasal washings and aspirates are unacceptable for Xpert Xpress SARS-CoV-2/FLU/RSV testing.  Fact Sheet for Patients: EntrepreneurPulse.com.au  Fact Sheet for Healthcare Providers: IncredibleEmployment.be  This test is not yet approved or cleared by the [place] FDA and has been authorized for detection and/or diagnosis of SARS-CoV-2 by FDA under an Emergency Use Authorization (EUA). This EUA will remain in effect (meaning this test can be used) for the duration of the COVID-19 declaration under Section 564(b)(1) of the Act, 21 U.S.C. section 360bbb-3(b)(1), unless the authorization is terminated or revoked.  Performed at [facility], [place] [address], [place], [place] [address]      Time coordinating discharge: Over 30 minutes  SIGNED:   Traeton Bordas J [place] DO  [facility] 04/15/2021, 11:10 AM

## 2021-04-17 ENCOUNTER — Inpatient Hospital Stay: Payer: Medicare Other | Admitting: Internal Medicine

## 2021-04-17 DIAGNOSIS — J90 Pleural effusion, not elsewhere classified: Secondary | ICD-10-CM | POA: Diagnosis not present

## 2021-04-17 DIAGNOSIS — W19XXXA Unspecified fall, initial encounter: Secondary | ICD-10-CM | POA: Diagnosis not present

## 2021-04-17 DIAGNOSIS — E118 Type 2 diabetes mellitus with unspecified complications: Secondary | ICD-10-CM | POA: Diagnosis not present

## 2021-04-17 DIAGNOSIS — J449 Chronic obstructive pulmonary disease, unspecified: Secondary | ICD-10-CM | POA: Diagnosis not present

## 2021-04-17 DIAGNOSIS — C3492 Malignant neoplasm of unspecified part of left bronchus or lung: Secondary | ICD-10-CM | POA: Diagnosis not present

## 2021-04-17 DIAGNOSIS — I4892 Unspecified atrial flutter: Secondary | ICD-10-CM | POA: Diagnosis not present

## 2021-04-17 DIAGNOSIS — J9610 Chronic respiratory failure, unspecified whether with hypoxia or hypercapnia: Secondary | ICD-10-CM | POA: Diagnosis not present

## 2021-04-18 ENCOUNTER — Other Ambulatory Visit: Payer: Self-pay | Admitting: *Deleted

## 2021-04-18 ENCOUNTER — Telehealth: Payer: Self-pay | Admitting: *Deleted

## 2021-04-18 DIAGNOSIS — E118 Type 2 diabetes mellitus with unspecified complications: Secondary | ICD-10-CM | POA: Diagnosis not present

## 2021-04-18 DIAGNOSIS — E46 Unspecified protein-calorie malnutrition: Secondary | ICD-10-CM | POA: Diagnosis not present

## 2021-04-18 DIAGNOSIS — R627 Adult failure to thrive: Secondary | ICD-10-CM | POA: Diagnosis not present

## 2021-04-18 DIAGNOSIS — G20 Parkinson's disease: Secondary | ICD-10-CM | POA: Diagnosis not present

## 2021-04-18 DIAGNOSIS — E611 Iron deficiency: Secondary | ICD-10-CM | POA: Diagnosis not present

## 2021-04-18 DIAGNOSIS — J9610 Chronic respiratory failure, unspecified whether with hypoxia or hypercapnia: Secondary | ICD-10-CM | POA: Diagnosis not present

## 2021-04-18 DIAGNOSIS — R5381 Other malaise: Secondary | ICD-10-CM | POA: Diagnosis not present

## 2021-04-18 DIAGNOSIS — I4892 Unspecified atrial flutter: Secondary | ICD-10-CM | POA: Diagnosis not present

## 2021-04-18 DIAGNOSIS — J449 Chronic obstructive pulmonary disease, unspecified: Secondary | ICD-10-CM | POA: Diagnosis not present

## 2021-04-18 DIAGNOSIS — J90 Pleural effusion, not elsewhere classified: Secondary | ICD-10-CM | POA: Diagnosis not present

## 2021-04-18 DIAGNOSIS — C3492 Malignant neoplasm of unspecified part of left bronchus or lung: Secondary | ICD-10-CM | POA: Diagnosis not present

## 2021-04-18 NOTE — Telephone Encounter (Signed)
RETURNED PATIENT'S WIFE'S PHONE [place], SPOKE [place]

## 2021-04-18 NOTE — Patient Outreach (Signed)
Per [place] eligible member resides in [facility].  Screened for potential care coordination services  Member's PCP at [facility] has [facility] care management services available if needed post SNF.  Mr. Vayda admitted to SNF on 04/15/21 after hospitalization.  Facility site visit to [facility] skilled nursing facility. Met with Mort Sawyers, SNF social workers. Anticipated transition plan is for home with hospice services. SNF SW reports [facility] palliative referral was made.  Will continue to follow while Mr. Fier resides in SNF.    Marthenia Rolling, MSN, RN,BSN [place] Acute Care Coordinator [phone] [facility]) [phone]  (Toll free office)

## 2021-04-19 ENCOUNTER — Other Ambulatory Visit: Payer: Self-pay

## 2021-04-19 ENCOUNTER — Ambulatory Visit (HOSPITAL_COMMUNITY)
Admission: RE | Admit: 2021-04-19 | Discharge: 2021-04-19 | Disposition: A | Discharge status: Home / Self Care | Payer: Medicare Other | Source: Ambulatory Visit | Attending: Physician Assistant | Admitting: Physician Assistant

## 2021-04-19 ENCOUNTER — Ambulatory Visit: Payer: Medicare Other | Admitting: Pulmonary Disease

## 2021-04-19 ENCOUNTER — Encounter (HOSPITAL_COMMUNITY): Payer: Self-pay | Admitting: Physician Assistant

## 2021-04-19 VITALS — BP 130/62 | HR 80 | Ht 73.0 in | Wt 172.0 lb

## 2021-04-19 DIAGNOSIS — D6869 Other thrombophilia: Secondary | ICD-10-CM | POA: Diagnosis not present

## 2021-04-19 DIAGNOSIS — Z09 Encounter for follow-up examination after completed treatment for conditions other than malignant neoplasm: Secondary | ICD-10-CM | POA: Diagnosis not present

## 2021-04-19 DIAGNOSIS — Z8616 Personal history of COVID-19: Secondary | ICD-10-CM | POA: Insufficient documentation

## 2021-04-19 DIAGNOSIS — E785 Hyperlipidemia, unspecified: Secondary | ICD-10-CM | POA: Insufficient documentation

## 2021-04-19 DIAGNOSIS — E118 Type 2 diabetes mellitus with unspecified complications: Secondary | ICD-10-CM | POA: Insufficient documentation

## 2021-04-19 DIAGNOSIS — Z79899 Other long term (current) drug therapy: Secondary | ICD-10-CM | POA: Diagnosis not present

## 2021-04-19 DIAGNOSIS — I10 Essential (primary) hypertension: Secondary | ICD-10-CM | POA: Diagnosis not present

## 2021-04-19 DIAGNOSIS — I4892 Unspecified atrial flutter: Secondary | ICD-10-CM | POA: Insufficient documentation

## 2021-04-19 DIAGNOSIS — Z7901 Long term (current) use of anticoagulants: Secondary | ICD-10-CM | POA: Diagnosis not present

## 2021-04-19 DIAGNOSIS — J449 Chronic obstructive pulmonary disease, unspecified: Secondary | ICD-10-CM | POA: Diagnosis not present

## 2021-04-19 DIAGNOSIS — I484 Atypical atrial flutter: Secondary | ICD-10-CM | POA: Diagnosis not present

## 2021-04-19 DIAGNOSIS — I2510 Atherosclerotic heart disease of native coronary artery without angina pectoris: Secondary | ICD-10-CM | POA: Insufficient documentation

## 2021-04-19 DIAGNOSIS — C3490 Malignant neoplasm of unspecified part of unspecified bronchus or lung: Secondary | ICD-10-CM | POA: Insufficient documentation

## 2021-04-19 LAB — CBC
HCT: 37.7 % — ABNORMAL LOW (ref 39.0–52.0)
Hemoglobin: 11.3 g/dL — ABNORMAL LOW (ref 13.0–17.0)
MCH: 25.6 pg — ABNORMAL LOW (ref 26.0–34.0)
MCHC: 30 g/dL (ref 30.0–36.0)
MCV: 85.3 fL (ref 80.0–100.0)
Platelets: 361 10*3/uL (ref 150–400)
RBC: 4.42 MIL/uL (ref 4.22–5.81)
RDW: 27.3 % — ABNORMAL HIGH (ref 11.5–15.5)
WBC: 6.8 10*3/uL (ref 4.0–10.5)
nRBC: 0 % (ref 0.0–0.2)

## 2021-04-19 LAB — DIGOXIN LEVEL: Digoxin Level: 0.2 ng/mL — ABNORMAL LOW (ref 0.8–2.0)

## 2021-04-19 NOTE — Progress Notes (Signed)
Primary Care Physician: [name], MD Primary Cardiologist: Dr [name]  Primary Electrophysiologist: none Referring Physician: Dr Roxy Manns Humphrey James [name] is a 76 y.o. male with a history of stage III/IV lung cancer, Parkinson's, COPD, DM, HTN, HLD, CAD, atrial flutter who presents for follow up in the [facility].  The patient was initially diagnosed with atrial flutter after presenting for his first round of chemotherapy on 02/11/21 and was found to be tachycardic. He was also diagnosed with COVID at that time. Patient is on Eliquis for a CHADS2VASC score of 5. He underwent TEE guided DCCV on 03/04/21.   On follow up today, patient was admitted 03/29/21 with dehydration and failure to thrive. He presented to ED again 6 days after discharge with rapid atrial flutter and cardiology was consulted. He was started on amiodarone and digoxin for rate control. Patient reports that he is reasonably well today. His heart rate is much better controlled in the 80s today. He is tolerating the medication without difficulty.   Today, he denies symptoms of palpitations, chest pain, orthopnea, PND, lower extremity edema, dizziness, presyncope, syncope, snoring, daytime somnolence, bleeding, or neurologic sequela. The patient is tolerating medications without difficulties and is otherwise without complaint today.    Atrial Fibrillation Risk Factors:  he does not have symptoms or diagnosis of sleep apnea. he does not have a history of rheumatic fever.   he has a BMI of Body mass index is 22.69 kg/m.Marland Kitchen Filed Weights   04/19/21 0911  Weight: 78 kg     Family History  Problem Relation Age of Onset   CAD Father    Alcohol abuse Father    Heart disease Father    COPD Mother    Asthma Mother    Emphysema Mother    Cancer Maternal Grandmother    Colon cancer Neg Hx      Atrial Fibrillation Management history:  Previous antiarrhythmic drugs: amiodarone  Previous  cardioversions: 03/04/21 Previous ablations: none CHADS2VASC score: 5 Anticoagulation history: Eliquis   Past Medical History:  Diagnosis Date   Allergy    seasonal   Blind left eye    legally   Blood transfusion    2002   COPD (chronic obstructive pulmonary disease) ([place])    Depression    Diabetes mellitus, type 2 (HCC)    Diverticulosis    Dyspnea    ED (erectile dysfunction)    Glaucoma    Hyperlipidemia    Hypertension    Insomnia    Internal and external bleeding hemorrhoids    Iron deficiency anemia    Lung cancer ([place])    Osteoarthritis    Parkinson's disease ([place]) 2014   Dr. [name] PCP  and Dr. [name] at [facility]   Paroxysmal atrial flutter [facility])    Rosacea    Tremor of both hands    Tubular adenoma of colon 03/28/2011   Past Surgical History:  Procedure Laterality Date   ARTHROSCOPIC REPAIR ACL     BALLOON DILATION  01/28/2021   Procedure: BALLOON DILATION;  Surgeon: [name], DO;  Location: [place] [facility];  Service: Pulmonary;;   BRONCHIAL BIOPSY  01/28/2021   Procedure: BRONCHIAL BIOPSIES;  Surgeon: [name], DO;  Location: [place];  Service: Pulmonary;;   CARDIOVERSION N/A 03/04/2021   Procedure: CARDIOVERSION;  Surgeon: [name], MD;  Location: [place] [facility];  Service: Cardiovascular;  Laterality: N/A;   [place]  1994   right   COLONOSCOPY WITH PROPOFOL N/A 07/25/2015   Procedure: COLONOSCOPY WITH PROPOFOL;  Surgeon: [name], MD;  Location: [facility];  Service: Endoscopy;  Laterality: N/A;   CRYOTHERAPY  01/28/2021   Procedure: CRYOTHERAPY;  Surgeon: [name], DO;  Location: [place] [facility];  Service: Pulmonary;;   ESOPHAGOGASTRODUODENOSCOPY (EGD) WITH PROPOFOL N/A 07/25/2015   Procedure: ESOPHAGOGASTRODUODENOSCOPY (EGD) WITH PROPOFOL;  Surgeon: [name], MD;  Location: [facility];  Service: Endoscopy;  Laterality: N/A;   HEMOSTASIS CONTROL  01/28/2021   Procedure: HEMOSTASIS  CONTROL;  Surgeon: [name], DO;  Location: [place];  Service: Pulmonary;;   INTRAVASCULAR PRESSURE WIRE/FFR STUDY N/A 01/22/2021   Procedure: INTRAVASCULAR PRESSURE WIRE/FFR STUDY;  Surgeon: [name], Peter M, MD;  Location: [place] [facility];  Service: Cardiovascular;  Laterality: N/A;   IR IMAGING GUIDED PORT INSERTION  03/18/2021   IR THORACENTESIS ASP PLEURAL SPACE W/IMG GUIDE  09/17/2020   IR THORACENTESIS ASP PLEURAL SPACE W/IMG GUIDE  10/25/2020   IR THORACENTESIS ASP PLEURAL SPACE W/IMG GUIDE  04/10/2021   KNEE ARTHROSCOPY  1996   LEFT HEART CATH AND CORONARY ANGIOGRAPHY N/A 01/22/2021   Procedure: LEFT HEART CATH AND CORONARY ANGIOGRAPHY;  Surgeon: [name], Peter M, MD;  Location: [place] [facility];  Service: Cardiovascular;  Laterality: N/A;   PARTIAL COLECTOMY  2008   TEE WITHOUT CARDIOVERSION N/A 03/04/2021   Procedure: TRANSESOPHAGEAL ECHOCARDIOGRAM (TEE);  Surgeon: [name], MD;  Location[facility];  Service: Cardiovascular;  Laterality: N/A;   THYROID CYST EXCISION     TONSILLECTOMY     VIDEO BRONCHOSCOPY Left 01/28/2021   Procedure: VIDEO BRONCHOSCOPY WITHOUT FLUORO;  Surgeon: [name], DO;  Location: [place];  Service: Pulmonary;  Laterality: Left;    Current Outpatient Medications  Medication Sig Dispense Refill   acetaminophen (TYLENOL) 325 MG tablet Take 2 tablets (650 mg total) by mouth every 6 (six) hours as needed for mild pain (or Fever >/= 101).     albuterol (VENTOLIN HFA) 108 (90 Base) MCG/ACT inhaler Inhale 2 puffs into the lungs every 6 (six) hours as needed for wheezing or shortness of breath.     Amino Acids-Protein Hydrolys (PRO-STAT) LIQD Take 30 mLs by mouth 2 (two) times daily.     amiodarone (PACERONE) 200 MG tablet Take 2 tablets (400 mg total) by mouth 2 (two) times daily for 5 days, THEN 1 tablet (200 mg total) 2 (two) times daily for 14 days, THEN 1 tablet (200 mg total) daily. 138 tablet 0   apixaban (ELIQUIS) 5 MG  TABS tablet Take 1 tablet (5 mg total) by mouth 2 (two) times daily. 60 tablet 3   atorvastatin (LIPITOR) 20 MG tablet Take 20 mg by mouth at bedtime.      buPROPion (WELLBUTRIN XL) 150 MG 24 hr tablet Take 150 mg by mouth daily.     calcium carbonate (TUMS - DOSED IN MG ELEMENTAL CALCIUM) 500 MG chewable tablet Chew 1 tablet by mouth daily.     calcium-vitamin D (OSCAL WITH D) 250-125 MG-UNIT per tablet Take 1 tablet by mouth daily.     Carbidopa-Levodopa ER (SINEMET CR) 25-100 MG tablet controlled release Take 1.5 tablets by mouth in the morning, at noon, and at bedtime. Take at 0800, 1400 & 2200     cetirizine (ZYRTEC) 10 MG tablet Take 10 mg by mouth daily.     Cholecalciferol (VITAMIN D-3) 25 MCG (1000 UT) CAPS Take 1 capsule  by mouth daily.     digoxin (LANOXIN) 0.125 MG tablet Take 1 tablet (0.125 mg total) by mouth daily.     donepezil (ARICEPT) 10 MG tablet Take 1 tablet (10 mg total) by mouth at bedtime. 90 tablet 3   famotidine (PEPCID) 20 MG tablet Take 20 mg by mouth daily.     ferrous gluconate (FERGON) 324 MG tablet TAKE 1 TABLET BY MOUTH DAILY WITH BREAKFAST 90 tablet 3   formoterol (PERFOROMIST) 20 MCG/2ML nebulizer solution Take 2 mLs (20 mcg total) by nebulization 2 (two) times daily. 120 mL 5   Green Tea 315 MG CAPS Take 315 mg by mouth daily.     hydrOXYzine (ATARAX) 10 MG tablet Take 5 mg by mouth 2 (two) times daily.     levalbuterol (XOPENEX) 0.63 MG/3ML nebulizer solution Take 0.63 mg by nebulization in the morning, at noon, and at bedtime.     Lidocaine 4 % PTCH Apply 2 patches topically daily.     lidocaine-prilocaine (EMLA) cream Apply 1 application topically as needed. 30 g 2   MEGARED OMEGA-3 KRILL OIL PO Take 1 capsule by mouth daily.     metFORMIN (GLUCOPHAGE-XR) 500 MG 24 hr tablet Take 500 mg by mouth 2 (two) times daily.     metoprolol tartrate (LOPRESSOR) 50 MG tablet Take 1 tablet (50 mg total) by mouth 2 (two) times daily.     mirtazapine (REMERON) 15 MG  tablet Take 15 mg by mouth at bedtime.     modafinil (PROVIGIL) 100 MG tablet Take 1 tablet (100 mg total) by mouth daily. 30 tablet 5   Multiple Vitamins-Minerals (MULTIVITAMIN WITH MINERALS) tablet Take 1 tablet by mouth daily. Centrum     nitroGLYCERIN (NITROSTAT) 0.4 MG SL tablet Place 0.4 mg under the tongue every 5 (five) minutes as needed for chest pain.     Omega-3 Fatty Acids (FISH OIL) 1000 MG CAPS Take 1 capsule by mouth daily.     omeprazole (PRILOSEC OTC) 20 MG tablet Take 20 mg by mouth every evening.     OXYGEN Inhale 4-6 L into the lungs continuous.     Probiotic Product (PROBIOTIC DAILY) CAPS Take 1 capsule by mouth every other day.     prochlorperazine (COMPAZINE) 10 MG tablet Take 1 tablet (10 mg total) by mouth every 6 (six) hours as needed for nausea or vomiting. 30 tablet 0   sertraline (ZOLOFT) 100 MG tablet Take 100 mg by mouth daily.     Tiotropium Bromide Monohydrate (SPIRIVA RESPIMAT) 2.5 MCG/ACT AERS Inhale 2 puffs into the lungs daily.     vitamin C (ASCORBIC ACID) 500 MG tablet Take 500 mg by mouth daily.     ibuprofen (ADVIL) 800 MG tablet Take 800 mg by mouth 3 (three) times daily.     No current facility-administered medications for this encounter.    Allergies  Allergen Reactions   Aspirin Other (See Comments)    GI bleed   Codeine Itching   Morphine Itching and Nausea Only    Social History   Socioeconomic History   Marital status: Married    Spouse name: Not on file   Number of children: 1   Years of education: HS   Highest education level: Not on file  Occupational History   Occupation: retired    Fish farm manager: RETIRED  Tobacco Use   Smoking status: Former    Packs/day: 1.50    Years: 50.00    Pack years: 75.00    Types: Cigarettes  Quit date: 04/30/2015    Years since quitting: 5.9   Smokeless tobacco: Never   Tobacco comments:    Former smoker and stop sneaking cigarettes 03/14/21  Vaping Use   Vaping Use: Never used  Substance and  Sexual Activity   Alcohol use: Not Currently    Comment: once yearly   Drug use: No   Sexual activity: Not on file  Other Topics Concern   Not on file  Social History Narrative   Drinks 1-2 Pepsi a day    Social Determinants of Health   Financial Resource Strain: Not on file  Food Insecurity: Not on file  Transportation Needs: Not on file  Physical Activity: Not on file  Stress: Not on file  Social Connections: Not on file  Intimate Partner Violence: Not on file     ROS- All systems are reviewed and negative except as per the HPI above.  Physical Exam: Vitals:   04/19/21 0911  BP: 130/62  Pulse: 80  Weight: 78 kg  Height: 6\' 1"  (1.854 m)    GEN- The patient is a well appearing elderly male, alert and oriented x 3 today.   HEENT-head normocephalic, atraumatic, sclera clear, conjunctiva pink, hearing intact, trachea midline. Lungs- Coarse breath sounds, normal work of breathing Heart- irregular rate and rhythm, no murmurs, rubs or gallops  GI- soft, NT, ND, + BS Extremities- no clubbing, cyanosis, or edema MS- no significant deformity or atrophy Skin- no rash or lesion Psych- euthymic mood, full affect Neuro- strength and sensation are intact   Wt Readings from Last 3 Encounters:  04/19/21 78 kg  04/09/21 85 kg  04/03/21 85.8 kg    EKG today demonstrates  Coarse afib vs atypical atrial flutter with variable block Vent. rate 80 BPM PR interval * ms QRS duration 78 ms QT/QTcB 546/629 ms QT overestimated being in atrial flutter  Echo 02/12/21 demonstrated  1. Left ventricular ejection fraction, by estimation, is 55 to 60%. The  left ventricle has normal function. The left ventricle has no regional  wall motion abnormalities. There is mild concentric left ventricular  hypertrophy. Left ventricular diastolic function could not be evaluated.   2. Right ventricular systolic function is normal. The right ventricular  size is normal. There is normal pulmonary  artery systolic pressure.   3. Left atrial size was mild to moderately dilated.   4. Right atrial size was mild to moderately dilated.   5. A small pericardial effusion is present. There is no evidence of  cardiac tamponade.   6. The mitral valve is normal in structure. No evidence of mitral valve regurgitation. No evidence of mitral stenosis.   7. The aortic valve is grossly normal. Aortic valve regurgitation is not visualized. No aortic stenosis is present.   8. There is mild dilatation of the ascending aorta, measuring 40 mm.   9. The inferior vena cava is dilated in size with >50% respiratory  variability, suggesting right atrial pressure of 8 mmHg.   Comparison(s): Changes from prior study are noted. Now in atrial flutter.   Epic records are reviewed at length today  CHA2DS2-VASc Score = 5  The patient's score is based upon: CHF History: 0 HTN History: 1 Diabetes History: 1 Stroke History: 0 Vascular Disease History: 1 Age Score: 2 Gender Score: 0       ASSESSMENT AND PLAN: 1. Atrial flutter The patient's CHA2DS2-VASc score is 5, indicating a 7.2% annual risk of stroke.   Rate controlled today. Patient does not  want repeat DCCV, will focus on rate control.  Continue amiodarone 200 mg BID for 2 weeks then 200 mg daily Continue Lopressor 50 mg BID Continue digoxin 0.125 mg daily Check digoxin level today. Continue Eliquis 5 mg BID  2. Secondary Hypercoagulable State (ICD10:  D68.69) The patient is at significant risk for stroke/thromboembolism based upon his CHA2DS2-VASc Score of 5.  Continue Apixaban (Eliquis).   3. HTN Stable, no changes today.  4. CAD Nonobstructive on LHC No anginal symptoms. On statin  5. Lung Cancer Now followed by palliative care. Overall poor prognosis.    Follow up with Dr [name] as scheduled.    [facility] [address] [place],  [address] [phone] 04/19/2021 9:25 AM

## 2021-04-21 NOTE — Progress Notes (Signed)
[place] OFFICE PROGRESS NOTE  [name], MD [address] [place] [address]  DIAGNOSIS: Stage IIIa/IV (T3, N1, M0/M1a) non-small cell lung cancer, squamous cell carcinoma presented with central area obstructive left lung mass with probably involvement of left hilar adenopathy and suspicious but not confirmed malignant pleural effusion and right hip lesion.  This was diagnosed in December 2022.   PD-L1: 15%   No actionable mutations by guardant 360.  PRIOR THERAPY: Concurrent chemoradiation with weekly carboplatin for AUC of 2 and paclitaxel 45 Mg/M2 for 6-7 weeks followed by immunotherapy as the patient has no evidence of disease progression. Last dose on 03/19/21.  CURRENT THERAPY: None  INTERVAL HISTORY: James [name] 76 y.o. male returns to the clinic today for a follow-up visit accompanied by his wife.  The last few months have been very challenging for the patient.  In December 2022, even prior to starting any treatment for his malignancy, the patient was found to have new onset atrial flutter with RVR and was admitted to the hospital for this concern.  At baseline, the patient has generalized weakness and chronic respiratory failure for which the patient is on 4 liters of supplemental oxygen.  He eventually was able to start his weekly chemotherapy treatment on 02/26/2021.  The patient completed 4 weeks of treatment but this was discontinued on 03/19/2021 since the patient had completed his radiation treatment.  Since being seen, the patient has been hospitalized on 2 more occasions for a flutter with RVR.  He follows closely with cardiology.  He was admitted from 03/29/2021 to 04/03/2021.  He is currently in a rehab facility.  He then presented to the emergency room again and was admitted from 04/09/2021 to 04/15/2021 for the same concern.  While admitted, he had a large left pleural effusion which was drained.  There was no malignant cells identified.   The patient had a repeat CT scan which showed complete opacification of the left hemithorax by large pleural effusion with complete atelectasis of the left lung and question drowned lung appearance of the left lower lobe.  Again, the scan noted abrupt cut off of air within the left mainstem bronchus which may be related to mucous plugging versus tumor.  Due to the patient's overall poor performance, the patient was discharged with palliative care with the intent of possibly transitioning to hospice.  The patient has an appointment with palliative care on 04/24/2021.  At this time, they are not sure if they are going to transition to hospice at this point.  Since being discharged, the patient is feeling fatigue and continues to have weakness.  He is currently getting physical therapy at rehab although he is not able to take steps on his own. The patient does have chronic anemia which he previously was seen by Dr. Lindi Adie even prior to his cancer diagnosis.  Today, he denies any fever or chills.  He reports night sweats every "once in a while".  He has lost a significant amount of weight over the last several weeks.  He reports chronic shortness of breath for which he is on supplemental oxygen.  He denies any significant cough.  He denies any chest pain or rib pain.  Denies any hemoptysis. Denies any nausea, vomiting, he has baseline diarrhea since having anastomosis of the large intestines, although he has had more diarrhea recently.  The patient states his diarrhea improves with the use of Imodium.  Denies any headache or visual changes.  The patient is here today for evaluation to review his scan and for a more detailed discussion about his current condition and treatment options.      MEDICAL HISTORY: Past Medical History:  Diagnosis Date   Allergy    seasonal   Blind left eye    legally   Blood transfusion    2002   COPD (chronic obstructive pulmonary disease) (HCC)    Depression    Diabetes  mellitus, type 2 (HCC)    Diverticulosis    Dyspnea    ED (erectile dysfunction)    Glaucoma    Hyperlipidemia    Hypertension    Insomnia    Internal and external bleeding hemorrhoids    Iron deficiency anemia    Lung cancer ([place])    Osteoarthritis    Parkinson's disease ([place]) 2014   Dr. [name] PCP  and Dr. [name] at [facility]   Paroxysmal atrial flutter [facility])    Rosacea    Tremor of both hands    Tubular adenoma of colon 03/28/2011    ALLERGIES:  is allergic to aspirin, codeine, and morphine.  MEDICATIONS:  Current Outpatient Medications  Medication Sig Dispense Refill   acetaminophen (TYLENOL) 325 MG tablet Take 2 tablets (650 mg total) by mouth every 6 (six) hours as needed for mild pain (or Fever >/= 101).     albuterol (VENTOLIN HFA) 108 (90 Base) MCG/ACT inhaler Inhale 2 puffs into the lungs every 6 (six) hours as needed for wheezing or shortness of breath.     Amino Acids-Protein Hydrolys (PRO-STAT) LIQD Take 30 mLs by mouth 2 (two) times daily.     amiodarone (PACERONE) 200 MG tablet Take 2 tablets (400 mg total) by mouth 2 (two) times daily for 5 days, THEN 1 tablet (200 mg total) 2 (two) times daily for 14 days, THEN 1 tablet (200 mg total) daily. 138 tablet 0   apixaban (ELIQUIS) 5 MG TABS tablet Take 1 tablet (5 mg total) by mouth 2 (two) times daily. 60 tablet 3   atorvastatin (LIPITOR) 20 MG tablet Take 20 mg by mouth at bedtime.      buPROPion (WELLBUTRIN XL) 150 MG 24 hr tablet Take 150 mg by mouth daily.     calcium carbonate (TUMS - DOSED IN MG ELEMENTAL CALCIUM) 500 MG chewable tablet Chew 1 tablet by mouth daily.     calcium-vitamin D (OSCAL WITH D) 250-125 MG-UNIT per tablet Take 1 tablet by mouth daily.     Carbidopa-Levodopa ER (SINEMET CR) 25-100 MG tablet controlled release Take 1.5 tablets by mouth in the morning, at noon, and at bedtime. Take at 0800, 1400 & 2200     cetirizine (ZYRTEC) 10 MG tablet Take 10 mg by mouth daily.     Cholecalciferol (VITAMIN D-3)  25 MCG (1000 UT) CAPS Take 1 capsule by mouth daily.     digoxin (LANOXIN) 0.125 MG tablet Take 1 tablet (0.125 mg total) by mouth daily.     donepezil (ARICEPT) 10 MG tablet Take 1 tablet (10 mg total) by mouth at bedtime. 90 tablet 3   famotidine (PEPCID) 20 MG tablet Take 20 mg by mouth daily.     ferrous gluconate (FERGON) 324 MG tablet TAKE 1 TABLET BY MOUTH DAILY WITH BREAKFAST 90 tablet 3   formoterol (PERFOROMIST) 20 MCG/2ML nebulizer solution Take 2 mLs (20 mcg total) by nebulization 2 (two) times daily. 120 mL 5   Green Tea 315 MG CAPS Take 315 mg by mouth daily.  hydrOXYzine (ATARAX) 10 MG tablet Take 5 mg by mouth 2 (two) times daily.     ibuprofen (ADVIL) 800 MG tablet Take 800 mg by mouth 3 (three) times daily.     levalbuterol (XOPENEX) 0.63 MG/3ML nebulizer solution Take 0.63 mg by nebulization in the morning, at noon, and at bedtime.     Lidocaine 4 % PTCH Apply 2 patches topically daily.     lidocaine-prilocaine (EMLA) cream Apply 1 application topically as needed. 30 g 2   MEGARED OMEGA-3 KRILL OIL PO Take 1 capsule by mouth daily.     metFORMIN (GLUCOPHAGE-XR) 500 MG 24 hr tablet Take 500 mg by mouth 2 (two) times daily.     metoprolol tartrate (LOPRESSOR) 50 MG tablet Take 1 tablet (50 mg total) by mouth 2 (two) times daily.     mirtazapine (REMERON) 15 MG tablet Take 15 mg by mouth at bedtime.     modafinil (PROVIGIL) 100 MG tablet Take 1 tablet (100 mg total) by mouth daily. 30 tablet 5   Multiple Vitamins-Minerals (MULTIVITAMIN WITH MINERALS) tablet Take 1 tablet by mouth daily. Centrum     Omega-3 Fatty Acids (FISH OIL) 1000 MG CAPS Take 1 capsule by mouth daily.     omeprazole (PRILOSEC OTC) 20 MG tablet Take 20 mg by mouth every evening.     OXYGEN Inhale 4-6 L into the lungs continuous.     Probiotic Product (PROBIOTIC DAILY) CAPS Take 1 capsule by mouth every other day.     prochlorperazine (COMPAZINE) 10 MG tablet Take 1 tablet (10 mg total) by mouth every 6  (six) hours as needed for nausea or vomiting. 30 tablet 0   sertraline (ZOLOFT) 100 MG tablet Take 100 mg by mouth daily.     Tiotropium Bromide Monohydrate (SPIRIVA RESPIMAT) 2.5 MCG/ACT AERS Inhale 2 puffs into the lungs daily.     vitamin C (ASCORBIC ACID) 500 MG tablet Take 500 mg by mouth daily.     nitroGLYCERIN (NITROSTAT) 0.4 MG SL tablet Place 0.4 mg under the tongue every 5 (five) minutes as needed for chest pain. (Patient not taking: Reported on 04/22/2021)     No current facility-administered medications for this visit.    SURGICAL HISTORY:  Past Surgical History:  Procedure Laterality Date   ARTHROSCOPIC REPAIR ACL     BALLOON DILATION  01/28/2021   Procedure: BALLOON DILATION;  Surgeon: [name], DO;  Location: [place];  Service: Pulmonary;;   BRONCHIAL BIOPSY  01/28/2021   Procedure: BRONCHIAL BIOPSIES;  Surgeon: [name], DO;  Location: [place];  Service: Pulmonary;;   CARDIOVERSION N/A 03/04/2021   Procedure: CARDIOVERSION;  Surgeon: [name], MD;  Location: [place];  Service: Cardiovascular;  Laterality: N/A;   [place]   right   COLONOSCOPY WITH PROPOFOL N/A 07/25/2015   Procedure: COLONOSCOPY WITH PROPOFOL;  Surgeon: [name], MD;  Location: [facility];  Service: Endoscopy;  Laterality: N/A;   CRYOTHERAPY  01/28/2021   Procedure: CRYOTHERAPY;  Surgeon: [name], DO;  Location: [facility];  Service: Pulmonary;;   ESOPHAGOGASTRODUODENOSCOPY (EGD) WITH PROPOFOL N/A 07/25/2015   Procedure: ESOPHAGOGASTRODUODENOSCOPY (EGD) WITH PROPOFOL;  Surgeon: [name], MD;  Location: [facility];  Service: Endoscopy;  Laterality: N/A;   HEMOSTASIS CONTROL  01/28/2021   Procedure: HEMOSTASIS CONTROL;  Surgeon: [name], DO;  Location: [facility];  Service: Pulmonary;;   INTRAVASCULAR PRESSURE WIRE/FFR STUDY N/A 01/22/2021  Procedure: INTRAVASCULAR PRESSURE WIRE/FFR STUDY;   Surgeon: [name], Peter M, MD;  Location: [facility] CV [facility];  Service: Cardiovascular;  Laterality: N/A;   IR IMAGING GUIDED PORT INSERTION  03/18/2021   IR THORACENTESIS ASP PLEURAL SPACE W/IMG GUIDE  09/17/2020   IR THORACENTESIS ASP PLEURAL SPACE W/IMG GUIDE  10/25/2020   IR THORACENTESIS ASP PLEURAL SPACE W/IMG GUIDE  04/10/2021   KNEE ARTHROSCOPY  1996   LEFT HEART CATH AND CORONARY ANGIOGRAPHY N/A 01/22/2021   Procedure: LEFT HEART CATH AND CORONARY ANGIOGRAPHY;  Surgeon: [name], Peter M, MD;  Location: [facility];  Service: Cardiovascular;  Laterality: N/A;   PARTIAL COLECTOMY  2008   TEE WITHOUT CARDIOVERSION N/A 03/04/2021   Procedure: TRANSESOPHAGEAL ECHOCARDIOGRAM (TEE);  Surgeon: [name], MD;  Location: [facility] ENDOSCOPY;  Service: Cardiovascular;  Laterality: N/A;   THYROID CYST EXCISION     TONSILLECTOMY     VIDEO BRONCHOSCOPY Left 01/28/2021   Procedure: VIDEO BRONCHOSCOPY WITHOUT FLUORO;  Surgeon: [name], DO;  Location: [place];  Service: Pulmonary;  Laterality: Left;    REVIEW OF SYSTEMS:   Review of Systems  Constitutional: Positive for fatigue, generalized weakness, decreased appetite, weight loss.  Negative for fever and chills.  Change.  HENT:   Negative for mouth sores, nosebleeds, sore throat and trouble swallowing.   Eyes: Negative for eye problems and icterus.  Respiratory: Positive for chronic (stable) shortness of breath.  Negative for cough, hemoptysis, and wheezing.   Cardiovascular: Negative for chest pain and leg swelling.  Gastrointestinal: Positive for chronic intermittent diarrhea.  Negative for abdominal pain, constipation, nausea and vomiting.  Genitourinary: Negative for bladder incontinence, difficulty urinating, dysuria, frequency and hematuria.   Musculoskeletal: Negative for back pain, gait problem, neck pain and neck stiffness.  Skin: Negative for itching and rash.  Neurological: Negative for dizziness, extremity weakness,  gait problem, headaches, light-headedness and seizures.  Hematological: Negative for adenopathy. Does not bruise/bleed easily.  Psychiatric/Behavioral: Negative for confusion, depression and sleep disturbance. The patient is not nervous/anxious.     PHYSICAL EXAMINATION:  Blood pressure 133/70, pulse 72, temperature (!) 96.5 F (35.8 C), temperature source Tympanic, resp. rate 18, SpO2 100 %.  ECOG PERFORMANCE STATUS: 3 - Symptomatic, >50% confined to bed  Physical Exam  Constitutional: Oriented to person, place, and time and thin appearing male and in no distress. Chronically ill appearing.  HENT:  Head: Normocephalic and atraumatic.  Mouth/Throat: Oropharynx is clear and moist. No oropharyngeal exudate.  Eyes: Conjunctivae are normal. Right eye exhibits no discharge. Left eye exhibits no discharge. No scleral icterus.  Neck: Normal range of motion. Neck supple.  Cardiovascular: Normal rate, irregular rhythm, normal heart sounds and intact distal pulses.   Pulmonary/Chest: Effort normal.  Absent breath sounds in the left lung.  No respiratory distress. No wheezes. No rales.  Abdominal: Soft. Bowel sounds are normal. Exhibits no distension and no mass. There is no tenderness.  Musculoskeletal: Normal range of motion. Exhibits no edema.  Lymphadenopathy:    No cervical adenopathy.  Neurological: Alert and oriented to person, place, and time. Exhibits muscle wasting.  Examined in the wheelchair Skin: Skin is warm and dry. No rash noted. Not diaphoretic. No erythema. No pallor.  Psychiatric: Mood, memory and judgment normal.  Vitals reviewed.  LABORATORY DATA: Lab Results  Component Value Date   WBC 6.8 04/19/2021   HGB 11.3 (L) 04/19/2021   HCT 37.7 (L) 04/19/2021   MCV 85.3 04/19/2021   PLT 361 04/19/2021  Chemistry      Component Value Date/Time   NA 136 04/15/2021 0425   NA 149 (H) 01/15/2021 1414   K 3.8 04/15/2021 0425   CL 96 (L) 04/15/2021 0425   CO2 31  04/15/2021 0425   BUN 7 (L) 04/15/2021 0425   BUN 12 01/15/2021 1414   CREATININE 0.60 (L) 04/15/2021 0425   CREATININE 0.94 03/27/2021 0915      Component Value Date/Time   CALCIUM 8.5 (L) 04/15/2021 0425   ALKPHOS 70 04/09/2021 0245   AST 14 (L) 04/09/2021 0245   AST 11 (L) 03/27/2021 0915   ALT 9 04/09/2021 0245   ALT <5 03/27/2021 0915   BILITOT 0.2 (L) 04/09/2021 0245   BILITOT 0.4 03/27/2021 0915       RADIOGRAPHIC STUDIES:  DG Chest 1 View  Result Date: 04/10/2021 CLINICAL DATA:  Status post left thoracentesis. EXAM: CHEST  1 VIEW COMPARISON:  April 09, 2021. FINDINGS: Stable complete opacification of left hemithorax most likely due to combination of atelectasis and effusion. No definite pneumothorax is noted. IMPRESSION: Stable complete opacification of left hemithorax most likely due to combination of atelectasis and effusion. No definite pneumothorax is noted status post left thoracentesis. Electronically Signed   By: Marijo Conception M.D.   On: 04/10/2021 12:27   CT HEAD WO CONTRAST  Result Date: 03/29/2021 CLINICAL DATA:  Altered mental status EXAM: CT HEAD WITHOUT CONTRAST TECHNIQUE: Contiguous axial images were obtained from the base of the skull through the vertex without intravenous contrast. RADIATION DOSE REDUCTION: This exam was performed according to the departmental dose-optimization program which includes automated exposure control, adjustment of the mA and/or kV according to patient size and/or use of iterative reconstruction technique. COMPARISON:  MRI 02/01/2021, CT 06/17/2019 FINDINGS: Brain: Normal anatomic configuration. Parenchymal volume loss is commensurate with the patient's age. Mild periventricular white matter changes are present likely reflecting the sequela of small vessel ischemia. No abnormal intra or extra-axial mass lesion or fluid collection. No abnormal mass effect or midline shift. No evidence of acute intracranial hemorrhage or infarct. Mild  ventriculomegaly is stable and appears commensurate with the degree of parenchymal volume loss. Cerebellum unremarkable. Vascular: Prominence of the M1 segment of the left MCA is stable and unchanged from prior MRI examination. Otherwise, no asymmetric hyperdense vasculature at the skull base. Skull: Intact Sinuses/Orbits: Paranasal sinuses are clear. Ocular lenses have been removed. Orbits are otherwise unremarkable. Other: Mastoid air cells and middle ear cavities are clear. IMPRESSION: No acute intracranial abnormality. Stable senescent change.  Stable mild ventriculomegaly. Electronically Signed   By: Fidela Salisbury M.D.   On: 03/29/2021 19:02   CT CHEST W CONTRAST  Result Date: 04/10/2021 CLINICAL DATA:  Pleural effusion, suspected malignancy EXAM: CT CHEST WITH CONTRAST TECHNIQUE: Multidetector CT imaging of the chest was performed during intravenous contrast administration. RADIATION DOSE REDUCTION: This exam was performed according to the departmental dose-optimization program which includes automated exposure control, adjustment of the mA and/or kV according to patient size and/or use of iterative reconstruction technique. CONTRAST:  59m OMNIPAQUE IOHEXOL 300 MG/ML  SOLN COMPARISON:  11/17/2020 FINDINGS: Cardiovascular: Atherosclerotic calcifications aorta and coronary arteries. Aorta normal caliber. Vascular structures grossly patent. Heart unremarkable. No pericardial effusion. Mediastinum/Nodes: Esophagus normal appearance. Diffusely heterogeneous thyroid lobes with multiple nodules up to 15 mm diameter; This has been evaluated on previous imaging, thyroid ultrasound 02/14/2021. (Ref: J Am Coll Radiol. 2015 Feb;12(2): 143-50). Base of cervical region otherwise normal appearance. Few scattered normal size mediastinal lymph  nodes without thoracic adenopathy. Lungs/Pleura: Complete opacification of the LEFT hemithorax by large pleural effusion. Complete atelectasis of LEFT lung. No definite pleural  thickening or mass. No discrete pulmonary mass identified. Low-attenuation in LEFT lower lobe question drowned lung. Again identified abrupt cut off of air within LEFT mainstem bronchus, may be related to mucous plugging though cannot exclude tumor with this appearance. Mucus within RIGHT mainstem bronchus. Underlying emphysematous changes in RIGHT lung. Subpleural area of pleural thickening versus nodularity posterior RIGHT upper lobe 18 x 7 mm image 46, minimally more prominent than on previous exam. Peribronchial thickening with subsegmental atelectasis in RIGHT middle lobe and RIGHT lower lobe. No RIGHT lung infiltrate, pleural effusion, or pneumothorax. Upper Abdomen: Diffuse thickening of the adrenal glands. Minimal diverticulosis at splenic flexure of colon. Remaining visualized upper abdomen unremarkable. Musculoskeletal: Diffuse osseous demineralization. IMPRESSION: Complete opacification of the LEFT hemithorax by large pleural effusion with complete atelectasis of LEFT lung and question drowned lung appearance of particularly the LEFT lower lobe. Again identified abrupt cut off of air within LEFT mainstem bronchus, may be related to mucous plugging though cannot exclude tumor with this appearance. Subpleural area of pleural thickening versus nodularity posterior RIGHT upper lobe 18 x 7 mm, minimally more prominent than on previous exam; recommend continued attention on follow-up imaging. Diffuse thickening of the adrenal glands bilaterally question adrenal hyperplasia. Coronary arterial calcifications. Diffusely heterogeneous thyroid lobes with multiple nodules up to 15 mm diameter; This has been evaluated on previous imaging, thyroid ultrasound 02/14/2021. Aortic Atherosclerosis (ICD10-I70.0) and Emphysema (ICD10-J43.9). Electronically Signed   By: Lavonia Dana M.D.   On: 04/10/2021 16:19   DG Chest Port 1 View  Result Date: 04/09/2021 CLINICAL DATA:  Flutter, weakness, tachycardia. EXAM: PORTABLE  CHEST 1 VIEW COMPARISON:  03/29/2021. FINDINGS: The heart size and mediastinal contours are obscured. There is atherosclerotic calcification of the aorta. There is complete opacification of the left lung. Mild atelectasis is noted at the right lung base. No pneumothorax. A stable right chest port is noted. No acute osseous abnormality. IMPRESSION: Complete opacification of the left lung, possibly representing a moderate to large pleural effusion with atelectasis or infiltrate. Findings may be associated with postobstructive pneumonitis related to known left perihilar mass. Electronically Signed   By: Brett Fairy M.D.   On: 04/09/2021 03:03   DG Chest Portable 1 View  Result Date: 03/29/2021 CLINICAL DATA:  Generalized weakness. EXAM: PORTABLE CHEST 1 VIEW COMPARISON:  Chest x-rays dated 02/18/2021 and 02/11/2021. Chest CT dated 11/17/2020. PET-CT dated 12/26/2020. FINDINGS: Heart size and mediastinal contours are grossly stable. 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 RIGHT lung is clear. RIGHT chest wall Port-A-Cath in place with tip adequately positioned at the level of the mid/lower SVC. Osseous structures about the chest are unremarkable. IMPRESSION: 1. No significant change compared to the chest x-ray of 02/18/2021. Again noted is a LEFT pleural effusion and airspace opacities throughout the LEFT lung with volume loss, compatible with the postobstructive changes related to the LEFT perihilar mass as demonstrated on earlier chest CT and PET-CT. 2. No new findings seen.  RIGHT lung is clear. Electronically Signed   By: Franki Cabot M.D.   On: 03/29/2021 18:37   IR THORACENTESIS ASP PLEURAL SPACE W/IMG GUIDE  Result Date: 04/10/2021 INDICATION: Patient with history of non-small cell lung  cancer, COPD,  Parkinson's disease, atrial flutter, left pleural effusion. Request received for diagnostic and therapeutic left thoracentesis. EXAM: ULTRASOUND GUIDED DIAGNOSTIC AND THERAPEUTIC LEFT THORACENTESIS MEDICATIONS: 10 ml 1% lidocaine COMPLICATIONS: None immediate. PROCEDURE: An ultrasound guided thoracentesis was thoroughly discussed with the patient and questions answered. The benefits, risks, alternatives and complications were also discussed. The patient understands and wishes to proceed with the procedure. Written consent was obtained. Ultrasound was performed to localize and mark an adequate pocket of fluid in the left chest. The area was then prepped and draped in the normal sterile fashion. 1% Lidocaine was used for local anesthesia. Under ultrasound guidance a 6 Fr Safe-T-Centesis catheter was introduced. Thoracentesis was performed. The catheter was removed and a dressing applied. FINDINGS: A total of approximately 2 liters of amber/blood-tinged fluid was removed. Samples were sent to the laboratory as requested by the clinical team. IMPRESSION: Successful ultrasound guided diagnostic and therapeutic left thoracentesis yielding 2 liters of pleural fluid. Read by: Rowe Robert, PA-C Electronically Signed   By: Corrie Mckusick D.O.   On: 04/10/2021 13:25     ASSESSMENT/PLAN:  This is a very pleasant 76 year old Caucasian male with likely stage IIIa/stage IV (T3, N1, M0/M1a) non-small cell lung cancer, squamous cell carcinoma.  He presented with a central area obstructive left lung mass with probable involvement of the left hilar adenopathy and suspicious but not confirmed malignant pleural effusion.  The patient's PD-L1 expression is 15% by guardant 360.  The patient does not have any actionable mutations.   The patient underwent an MRI of the right hip which was found to show indeterminate small metastatic lesion vs. Focal synovitis. The plan is to continue with treatment as planned with close monitoring of  this area.    The patient underwent 4 weekly cycles of carboplatin for an AUC of 2 and paclitaxel 45 mg/m2 with concurrent radiation.   He has several comorbidities and had been admitted to the hospital on several occassions for atrial flutter with RVR. He follows with cardiology.   The patient recently had a restaging CT scan. Dr. Julien Nordmann personally and independently reviewed the scan and discussed the results with the patient and his wife. The scan showed continued complete opacification of the left hemithorax by a large pleural effusion with complete atelectasis of the left lung.  There was again abrupt cut off of air within the left mainstem bronchus which may be related to mucous plugging although tumor cannot be excluded.  There was subpleural areas of pleural thickening versus nodularity posterior right upper lobe, and close follow-up was recommended.  Dr. Julien Nordmann discussed that the patient is likely not a candidate for consolidation immunotherapy given his current condition and poor performance status.  Given the opacification of the left lung, Dr. Julien Nordmann reports it is unclear if there is tumor involvement.  Dr. Julien Nordmann recommends that the patient continue on observation at this time with a repeat CT scan of the chest in 3 months.  In the meantime, Dr. Julien Nordmann recommends that the patient continue to follow with cardiology and pulmonology regarding his pleural effusion and a flutter.  The patient has absent breath sounds in the left lung today.  The patient has an appointment with Dr. Valeta Harms on 05/02/2021.  I will reach out to Dr. Valeta Harms to see if he can be evaluated sooner for consideration of Pleurx.  Otherwise, we would be happy to arrange for a IR thoracentesis in the interval until he can be seen by pulmonology.  I will  arrange for the patient to have a flush appointments every 6 to 8 weeks.  The patient is scheduled to establish with palliative care later this week.   The patient was  advised to call immediately if he has any concerning symptoms in the interval. The patient voices understanding of current disease status and treatment options and is in agreement with the current care plan. All questions were answered. The patient knows to call the clinic with any problems, questions or concerns. We can certainly see the patient much sooner if necessary   Orders Placed This Encounter  Procedures   CT Chest W Contrast    Standing Status:   Future    Standing Expiration Date:   04/22/2022    Order Specific Question:   If indicated for the ordered procedure, I authorize the administration of contrast media per Radiology protocol    Answer:   Yes    Order Specific Question:   Preferred imaging location?    Answer:   [facility]   CBC with Differential (Cancer Center Only)    Standing Status:   Future    Standing Expiration Date:   04/22/2022   CMP ([facility] only)    Standing Status:   Future    Standing Expiration Date:   04/22/2022   Ferritin    Standing Status:   Future    Standing Expiration Date:   04/22/2022   Iron and Iron Binding Capacity ([facility] only)    Standing Status:   Future    Standing Expiration Date:   04/22/2022   Sample to Blood Bank    Standing Status:   Future    Standing Expiration Date:   04/22/2022     Tobe Sos Jahel Wavra, PA-C 04/22/21  ADDENDUM: Hematology/Oncology Attending: I had a face-to-face encounter with the patient today.  I reviewed his record, lab, scan I recommended his care plan.  This is a very pleasant 76 years old white male with stage IV (T3, N1, M1 a) non-small cell lung cancer, squamous cell carcinoma diagnosed in December 2022 with no actionable mutation and PD-L1 expression of 15%.  The patient underwent a course of concurrent chemoradiation with weekly carboplatin and paclitaxel status post 4 cycles of chemotherapy but he completed a course of radiation.  He has been complaining of increasing fatigue and  weakness as well as shortness of breath.  He also has atrial fibrillation with RVR and was admitted to the hospital recently for management of his condition.  He is followed by cardiology.  The patient has been admitted to the hospital few times recently because of the atrial fibrillation. He had repeat CT scan of the chest performed recently.  I personally and independently reviewed the scan images and discussed the result with the patient and his wife. His scan showed complete opacification of the left side of the thorax secondary to recurrent pleural effusion and lung collapse. I recommended for the patient to see Dr. Valeta Harms soon for consideration of Pleurx catheter placement and drainage of the recurrent left pleural effusion.  I do not see any other evidence for disease progression. Normally we would have to consider The patient for treatment with immunotherapy with Imfinzi as a consolidation after his concurrent chemoradiation but the patient has poor performance status and declining condition and I feel that observation is probably reasonable for him at this point. We will see him back for follow-up visit in around 3 months for evaluation and repeat CT scan of the chest.  He has multiple medical problems and he may be a candidate for palliative care and hospice because of his other medical issues more than the lung cancer. He was advised to call immediately if he has any other concerning issues in the interval. The total time spent in the appointment was 35 minutes. Disclaimer: This note was dictated with voice recognition software. Similar sounding words can inadvertently be transcribed and may be missed upon review. Eilleen Kempf, MD

## 2021-04-22 ENCOUNTER — Encounter: Payer: Self-pay | Admitting: Physician Assistant

## 2021-04-22 ENCOUNTER — Encounter: Payer: Self-pay | Admitting: Urology

## 2021-04-22 ENCOUNTER — Other Ambulatory Visit: Payer: Self-pay

## 2021-04-22 ENCOUNTER — Inpatient Hospital Stay (HOSPITAL_BASED_OUTPATIENT_CLINIC_OR_DEPARTMENT_OTHER): Payer: Medicare Other | Admitting: Physician Assistant

## 2021-04-22 VITALS — BP 133/70 | HR 72 | Temp 96.5°F | Resp 18

## 2021-04-22 DIAGNOSIS — C3492 Malignant neoplasm of unspecified part of left bronchus or lung: Secondary | ICD-10-CM | POA: Diagnosis not present

## 2021-04-22 DIAGNOSIS — R630 Anorexia: Secondary | ICD-10-CM | POA: Diagnosis not present

## 2021-04-22 DIAGNOSIS — Z9981 Dependence on supplemental oxygen: Secondary | ICD-10-CM | POA: Diagnosis not present

## 2021-04-22 DIAGNOSIS — J90 Pleural effusion, not elsewhere classified: Secondary | ICD-10-CM | POA: Insufficient documentation

## 2021-04-22 DIAGNOSIS — I4891 Unspecified atrial fibrillation: Secondary | ICD-10-CM | POA: Diagnosis not present

## 2021-04-22 DIAGNOSIS — F418 Other specified anxiety disorders: Secondary | ICD-10-CM | POA: Diagnosis not present

## 2021-04-22 DIAGNOSIS — C3432 Malignant neoplasm of lower lobe, left bronchus or lung: Secondary | ICD-10-CM

## 2021-04-22 DIAGNOSIS — F331 Major depressive disorder, recurrent, moderate: Secondary | ICD-10-CM | POA: Diagnosis not present

## 2021-04-22 DIAGNOSIS — E860 Dehydration: Secondary | ICD-10-CM | POA: Diagnosis not present

## 2021-04-22 DIAGNOSIS — R4189 Other symptoms and signs involving cognitive functions and awareness: Secondary | ICD-10-CM | POA: Diagnosis not present

## 2021-04-22 DIAGNOSIS — F4320 Adjustment disorder, unspecified: Secondary | ICD-10-CM | POA: Diagnosis not present

## 2021-04-22 NOTE — Progress Notes (Addendum)
Spoke w/ patient's spouse "Mrs. Roena Malady", verified identity, and began nursing interview for patient "Mr. James [name]". Spouse reports, shortness of breath, overall muscle weakness, and fatigue. Patient is still not ambulating, but denies any pain. Mr. James [name] is undergoing physical therapy. ? ?Meaningful use complete. ? ?Reminded Mrs. Maslanka of patient's 11:00am-04/25/21 telephone appointment w/ Ashlyn Bruning PA-C. I left my extension [phone] in case patient needs anything. Mrs. Grayer verbalized understanding of information. ? ?Patient contact for telephone appointment- "Mrs. Dziedzic" at [phone] ? ?

## 2021-04-23 ENCOUNTER — Telehealth: Payer: Self-pay

## 2021-04-23 ENCOUNTER — Telehealth: Payer: Self-pay | Admitting: Pulmonary Disease

## 2021-04-23 ENCOUNTER — Ambulatory Visit: Payer: Medicare Other | Admitting: Neurology

## 2021-04-23 LAB — MISC LABCORP TEST (SEND OUT): Labcorp test code: 9985

## 2021-04-23 NOTE — Telephone Encounter (Signed)
-----   Message from [name], DO sent at 04/23/2021  9:53 AM EST ----- Regarding: FW: Left pleural effusion  Please set up patient for thoracentesis (possible discussion regarding pleurex catheter on site with patient) on Friday March 3rd in the afternoon with Dr. Tamala Julian at [facility].   I spoke with his wife. They are good for Friday. I asked them to hold his eliquis starting tomorrow please call his rehab to confirm.    Please arrange transport from his rehab facility.   Thanks  Brad     ----- Message ----- From: Heilingoetter, Tobe Sos, PA-C Sent: 04/22/2021   9:56 AM EST To: [name], DO Subject: Left pleural effusion                          Good Morning Dr. Valeta Harms,   We just saw this gentleman today. He is scheduled to see you on 05/02/21. His left pleural effusion has re acculumated. I am reaching out to see if you would be able to see him sooner than 3/9 or if you would prefer for [place] to arrange a IR thoracentesis in the interim until he sees you to discuss pleurex? Let me know! I would be happy to arrange thoracentesis.   Cassie

## 2021-04-23 NOTE — Telephone Encounter (Signed)
I called and gave procedure code [id number] for this patient and if we could update in the notes. Thank you.

## 2021-04-23 NOTE — Telephone Encounter (Signed)
Call received from pts rehab facility requesting we refill pts rx of Wellbutrin. I advised her we do not prescribe that medication for this pt.  I have also called pts pharmacy, [facility] and confirmed with them that this medication is prescribed by Dr. Loney Loh, pts PCP.  Lastly, I have called pts wife, [name] and LVM advising of this.

## 2021-04-23 NOTE — Telephone Encounter (Signed)
Called and spoke with patient's wife as well as Janett Billow at [facility] and [place]. Let them know patient's James [name] is scheduled for 04/26/2021 at 2 pm with Dr. Tamala Julian at [facility] was instructed to arrive at hospital at 1:30. Janett Billow was informed that patient needs to hold their blood thinner Eliquis , 2 days prior to procedure. Note from Dr. Valeta Harms to hold Eliquis was faxed over to facility as requested by Jessica at [phone] and [phone].  nothing further needed  Routing to Dr. Valeta Harms and Dr. Tamala Julian as Juluis Rainier

## 2021-04-23 NOTE — Telephone Encounter (Signed)
Me      3:02 PM Note Called and spoke with patient's wife as well as Janett Billow at [facility]. Let them know patient's James [name] is scheduled for 04/26/2021 at 2 pm with Dr. Tamala Julian at [place] was instructed to arrive at hospital at 1:30. Janett Billow was informed that patient needs to hold their blood thinner Eliquis , 2 days prior to procedure. Note from Dr. Valeta Harms to hold Eliquis was faxed over to facility as requested by Jessica at [phone] and [phone].   nothing further needed   Routing to Dr. Valeta Harms and Dr. Tamala Julian as Juluis Rainier

## 2021-04-24 ENCOUNTER — Non-Acute Institutional Stay: Payer: Self-pay | Admitting: Internal Medicine

## 2021-04-24 ENCOUNTER — Other Ambulatory Visit: Payer: Self-pay

## 2021-04-24 DIAGNOSIS — Z515 Encounter for palliative care: Secondary | ICD-10-CM

## 2021-04-24 DIAGNOSIS — C3492 Malignant neoplasm of unspecified part of left bronchus or lung: Secondary | ICD-10-CM

## 2021-04-24 DIAGNOSIS — J90 Pleural effusion, not elsewhere classified: Secondary | ICD-10-CM

## 2021-04-24 DIAGNOSIS — R5381 Other malaise: Secondary | ICD-10-CM

## 2021-04-24 DIAGNOSIS — R634 Abnormal weight loss: Secondary | ICD-10-CM | POA: Diagnosis not present

## 2021-04-24 DIAGNOSIS — I484 Atypical atrial flutter: Secondary | ICD-10-CM

## 2021-04-24 NOTE — Progress Notes (Deleted)
Therapist, nutritional Palliative Care Consult Note Telephone: [phone]  Fax: [phone]   Date of encounter: 04/24/21 5:31 PM PATIENT NAME: James [name] [address] [place] [address]   [phone] (home)  DOB: 1945/12/15 MRN: [id number] PRIMARY CARE PROVIDER:    Merri Brunette, MD,  [address] [place] [address] [place] [address] [phone]  REFERRING PROVIDER:   Merri Brunette, MD [address] [place],  [place] [address] [phone]  RESPONSIBLE PARTY:    Contact Information     Name Relation Home Work Mobile   Goetzke,Lucy Pincus Sanes [phone]  [phone]        I met face to face with patient and family in *** home/facility. Palliative Care was asked to follow this patient by consultation request of  Merri Brunette, MD to address advance care planning and complex medical decision making. This is the initial visit.                                     ASSESSMENT AND PLAN / RECOMMENDATIONS:   Advance Care Planning/Goals of Care: Goals include to maximize quality of life and symptom management. Patient/health care surrogate gave his/her permission to discuss.Our advance care planning conversation included a discussion about:    The value and importance of advance care planning  Experiences with loved ones who have been seriously ill or have died  Exploration of personal, cultural or spiritual beliefs that might influence medical decisions  Exploration of goals of care in the event of a sudden injury or illness  Identification  of a healthcare agent  Review and updating or creation of an  advance directive document . Decision not to resuscitate or to de-escalate disease focused treatments due to poor prognosis. CODE STATUS:  Symptom Management/Plan:    Follow up Palliative Care Visit: Palliative care will continue to follow for complex medical decision making, advance care planning, and clarification of  goals. Return *** weeks or prn.  I spent *** minutes providing this consultation. More than 50% of the time in this consultation was spent in counseling and care coordination.  This visit was coded based on medical decision making (MDM).***  PPS: ***0%  HOSPICE ELIGIBILITY/DIAGNOSIS: TBD  Chief Complaint: ***  HISTORY OF PRESENT ILLNESS:  James [name] is a 76 y.o. year old male  with *** .   History obtained from review of EMR, discussion with primary team, and interview with family, facility staff/caregiver and/or Mr. Klas.  I reviewed available labs, medications, imaging, studies and related documents from the EMR.  Records reviewed and summarized above.   ROS  *** General: NAD EYES: denies vision changes ENMT: denies dysphagia Cardiovascular: denies chest pain, denies DOE Pulmonary: denies cough, denies increased SOB Abdomen: endorses good appetite, denies constipation, endorses continence of bowel GU: denies dysuria, endorses continence of urine MSK:  denies increased weakness,  no falls reported Skin: denies rashes or wounds Neurological: denies pain, denies insomnia Psych: Endorses positive mood Heme/lymph/immuno: denies bruises, abnormal bleeding  Physical Exam: Current and past weights: Constitutional: NAD General: frail appearing, thin/WNWD/obese  EYES: anicteric sclera, lids intact, no discharge  ENMT: intact hearing, oral mucous membranes moist, dentition intact CV: S1S2, RRR, no LE edema Pulmonary: LCTA, no increased work of breathing, no cough, room air Abdomen: intake 100%, normo-active BS + 4 quadrants, soft and non tender, no ascites GU: deferred MSK: no sarcopenia, moves all  extremities, ambulatory Skin: warm and dry, no rashes or wounds on visible skin Neuro:  no generalized weakness,  no cognitive impairment Psych: non-anxious affect, A and O x 3 Hem/lymph/immuno: no widespread bruising CURRENT PROBLEM LIST:  Patient Active Problem List    Diagnosis Date Noted   GERD (gastroesophageal reflux disease) 04/10/2021   Hypomagnesemia; hypokalemia 04/10/2021   Atypical atrial flutter (HCC) 04/09/2021   Pleural effusion 04/09/2021   Hypokalemia 04/09/2021   Malnutrition of moderate degree 04/01/2021   Pressure injury of skin 03/31/2021   Failure to thrive in adult 03/31/2021   Dehydration 03/29/2021   Chronic respiratory failure with hypoxia (HCC) 03/29/2021   Thyroid nodule 03/29/2021   Bacteriuria 03/29/2021   Generalized weakness 03/29/2021   Secondary hypercoagulable state (HCC) 03/14/2021   Abnormal TSH 02/14/2021   Acute on chronic respiratory failure with hypoxia (HCC) 02/13/2021   Tachycardia, unspecified 02/11/2021   Paroxysmal atrial flutter (HCC) 02/11/2021   COVID-19 virus infection 02/11/2021   Hypertension    Hyperlipidemia associated with type 2 diabetes mellitus (HCC)    Diabetes mellitus, type 2 (HCC)    Depression    Protein-calorie malnutrition, severe (HCC)    Chest pain 02/06/2021   Encounter for antineoplastic chemotherapy 02/01/2021   Endobronchial cancer, left (HCC)    Chest pain, precordial 01/22/2021   AVM (arteriovenous malformation) of small bowel, 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   Iron deficiency anemia    Heme positive stool    History of colonic polyps    Primary cancer of left lower lobe of lung (HCC) 04/14/2013   Upper airway cough syndrome 04/14/2013   Hypoxemia 06/08/2012   Parkinson's disease (HCC) 2014   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   PAST MEDICAL HISTORY:  Active Ambulatory Problems    Diagnosis Date Noted   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   Hypoxemia 06/08/2012   Primary cancer of left lower lobe of lung (HCC) 04/14/2013   Upper airway cough syndrome 04/14/2013   Iron deficiency anemia    Heme positive stool    History of colonic polyps    AVM (arteriovenous malformation) of small bowel, 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   Chest pain, precordial 01/22/2021   Endobronchial cancer,  left (HCC)    Encounter for antineoplastic chemotherapy 02/01/2021   Chest pain 02/06/2021   Tachycardia, unspecified 02/11/2021   Paroxysmal atrial flutter (HCC) 02/11/2021   Hypertension    Hyperlipidemia associated with type 2 diabetes mellitus (HCC)    Diabetes mellitus, type 2 (HCC)    Parkinson's disease (HCC) 2014   Depression    Protein-calorie malnutrition, severe (HCC)    COVID-19 virus infection 02/11/2021   Acute on chronic respiratory failure with hypoxia (HCC) 02/13/2021   Abnormal TSH 02/14/2021   Secondary hypercoagulable state (HCC) 03/14/2021   Dehydration 03/29/2021   Chronic respiratory failure with hypoxia (HCC) 03/29/2021   Thyroid nodule 03/29/2021   Bacteriuria 03/29/2021   Generalized weakness 03/29/2021   Pressure injury of skin 03/31/2021   Failure to thrive in adult 03/31/2021   Malnutrition of moderate degree 04/01/2021   Atypical atrial flutter (HCC) 04/09/2021   Pleural effusion 04/09/2021   Hypokalemia 04/09/2021   GERD (gastroesophageal reflux disease) 04/10/2021   Hypomagnesemia; hypokalemia 04/10/2021   Resolved Ambulatory Problems    Diagnosis Date Noted   Overweight(278.02) 06/16/2007   EMPHYSEMA 06/16/2007   Past Medical History:  Diagnosis Date   Allergy    Blind left eye    Blood transfusion    COPD (chronic obstructive pulmonary disease) (HCC)  Diverticulosis    Dyspnea    ED (erectile dysfunction)    Glaucoma    Hyperlipidemia    Insomnia    Internal and external bleeding hemorrhoids    Lung cancer (HCC)    Osteoarthritis    Rosacea    Tremor of both hands    Tubular adenoma of colon 03/28/2011   SOCIAL HX:  Social History   Tobacco Use   Smoking status: Former    Packs/day: 1.50    Years: 50.00    Pack years: 75.00    Types: Cigarettes    Quit date: 04/30/2015    Years since quitting: 5.9   Smokeless tobacco: Never   Tobacco comments:    Former smoker and stop sneaking cigarettes 03/14/21  Substance Use  Topics   Alcohol use: Not Currently    Comment: once yearly   FAMILY HX:  Family History  Problem Relation Age of Onset   CAD Father    Alcohol abuse Father    Heart disease Father    COPD Mother    Asthma Mother    Emphysema Mother    Cancer Maternal Grandmother    Colon cancer Neg Hx       ALLERGIES:  Allergies  Allergen Reactions   Morphine Itching and Nausea Only   Aspirin Other (See Comments)    GI bleed   Codeine Itching     PERTINENT MEDICATIONS:  Outpatient Encounter Medications as of 04/24/2021  Medication Sig   acetaminophen (TYLENOL) 325 MG tablet Take 2 tablets (650 mg total) by mouth every 6 (six) hours as needed for mild pain (or Fever >/= 101).   albuterol (VENTOLIN HFA) 108 (90 Base) MCG/ACT inhaler Inhale 2 puffs into the lungs every 6 (six) hours as needed for wheezing or shortness of breath.   Amino Acids-Protein Hydrolys (PRO-STAT) LIQD Take 30 mLs by mouth 2 (two) times daily.   amiodarone (PACERONE) 200 MG tablet Take 2 tablets (400 mg total) by mouth 2 (two) times daily for 5 days, THEN 1 tablet (200 mg total) 2 (two) times daily for 14 days, THEN 1 tablet (200 mg total) daily.   apixaban (ELIQUIS) 5 MG TABS tablet Take 1 tablet (5 mg total) by mouth 2 (two) times daily.   atorvastatin (LIPITOR) 20 MG tablet Take 20 mg by mouth at bedtime.    buPROPion (WELLBUTRIN XL) 150 MG 24 hr tablet Take 150 mg by mouth daily.   calcium carbonate (TUMS - DOSED IN MG ELEMENTAL CALCIUM) 500 MG chewable tablet Chew 1 tablet by mouth daily.   calcium-vitamin D (OSCAL WITH D) 250-125 MG-UNIT per tablet Take 1 tablet by mouth daily.   Carbidopa-Levodopa ER (SINEMET CR) 25-100 MG tablet controlled release Take 1.5 tablets by mouth in the morning, at noon, and at bedtime. Take at 0800, 1400 & 2200   cetirizine (ZYRTEC) 10 MG tablet Take 10 mg by mouth daily.   Cholecalciferol (VITAMIN D-3) 25 MCG (1000 UT) CAPS Take 1 capsule by mouth daily.   digoxin (LANOXIN) 0.125 MG  tablet Take 1 tablet (0.125 mg total) by mouth daily.   donepezil (ARICEPT) 10 MG tablet Take 1 tablet (10 mg total) by mouth at bedtime.   famotidine (PEPCID) 20 MG tablet Take 20 mg by mouth daily.   ferrous gluconate (FERGON) 324 MG tablet TAKE 1 TABLET BY MOUTH DAILY WITH BREAKFAST   formoterol (PERFOROMIST) 20 MCG/2ML nebulizer solution Take 2 mLs (20 mcg total) by nebulization 2 (two) times daily.  Green Tea 315 MG CAPS Take 315 mg by mouth daily.   hydrOXYzine (ATARAX) 10 MG tablet Take 5 mg by mouth 2 (two) times daily.   ibuprofen (ADVIL) 800 MG tablet Take 800 mg by mouth 3 (three) times daily.   levalbuterol (XOPENEX) 0.63 MG/3ML nebulizer solution Take 0.63 mg by nebulization in the morning, at noon, and at bedtime.   Lidocaine 4 % PTCH Apply 2 patches topically daily.   lidocaine-prilocaine (EMLA) cream Apply 1 application topically as needed.   MEGARED OMEGA-3 KRILL OIL PO Take 1 capsule by mouth daily.   metFORMIN (GLUCOPHAGE-XR) 500 MG 24 hr tablet Take 500 mg by mouth 2 (two) times daily.   metoprolol tartrate (LOPRESSOR) 50 MG tablet Take 1 tablet (50 mg total) by mouth 2 (two) times daily.   mirtazapine (REMERON) 15 MG tablet Take 15 mg by mouth at bedtime.   modafinil (PROVIGIL) 100 MG tablet Take 1 tablet (100 mg total) by mouth daily.   Multiple Vitamins-Minerals (MULTIVITAMIN WITH MINERALS) tablet Take 1 tablet by mouth daily. Centrum   nitroGLYCERIN (NITROSTAT) 0.4 MG SL tablet Place 0.4 mg under the tongue every 5 (five) minutes as needed for chest pain. (Patient not taking: Reported on 04/22/2021)   Omega-3 Fatty Acids (FISH OIL) 1000 MG CAPS Take 1 capsule by mouth daily.   omeprazole (PRILOSEC OTC) 20 MG tablet Take 20 mg by mouth every evening.   OXYGEN Inhale 4-6 L into the lungs continuous.   Probiotic Product (PROBIOTIC DAILY) CAPS Take 1 capsule by mouth every other day.   prochlorperazine (COMPAZINE) 10 MG tablet Take 1 tablet (10 mg total) by mouth every 6  (six) hours as needed for nausea or vomiting.   sertraline (ZOLOFT) 100 MG tablet Take 100 mg by mouth daily.   Tiotropium Bromide Monohydrate (SPIRIVA RESPIMAT) 2.5 MCG/ACT AERS Inhale 2 puffs into the lungs daily.   vitamin C (ASCORBIC ACID) 500 MG tablet Take 500 mg by mouth daily.   No facility-administered encounter medications on file as of 04/24/2021.   Thank you for the opportunity to participate in the care of Mr. Likes.  The palliative care team will continue to follow. Please call our office at [phone] if we can be of additional assistance.   Mayara Paulson, DO ,   COVID-19 PATIENT SCREENING TOOL Asked and negative response unless otherwise noted:  Have you had symptoms of covid, tested positive or been in contact with someone with symptoms/positive test in the past 5-10 days?

## 2021-04-25 ENCOUNTER — Other Ambulatory Visit: Payer: Self-pay | Admitting: *Deleted

## 2021-04-25 ENCOUNTER — Telehealth: Payer: Self-pay | Admitting: Pulmonary Disease

## 2021-04-25 ENCOUNTER — Ambulatory Visit
Admission: RE | Admit: 2021-04-25 | Discharge: 2021-04-25 | Disposition: A | Discharge status: Home / Self Care | Payer: Medicare Other | Source: Ambulatory Visit | Attending: Urology | Admitting: Urology

## 2021-04-25 DIAGNOSIS — C3432 Malignant neoplasm of lower lobe, left bronchus or lung: Secondary | ICD-10-CM

## 2021-04-25 NOTE — Progress Notes (Signed)
Radiation Oncology         ([phone]) [phone] ________________________________  Name: James [name] MRN: [id number]  Date: 04/25/2021  DOB: 1945-05-13  Post Treatment Note  CC: [name], MD  [name], DO  Diagnosis:   76 yo male with  stage IIIa/IV (T3, N1, M0/M1a) squamous cell carcinoma, of the Left Lower Lobe of the lung - with left hilar adenopathy and suspicious but not confirmed malignant right hip lesion.  Interval Since Last Radiation:  4 weeks  02/04/21 - 03/27/21:   The primary tumor and involved mediastinal adenopathy were treated to 66 Gy in 33 fractions of 2 Gy; concurrent with chemotherapy (completed 4 of 6 planned cycles on 03/19/21).   Narrative: I spoke with the patient's wife, [name], to conduct the routine scheduled 1 month follow up visit via telephone to spare the patient unnecessary potential exposure in the healthcare setting during the current COVID-19 pandemic.  The patient was notified in advance and gave permission to proceed with this visit format.  He tolerated radiation treatment relatively well.  He experienced some  skin irritation in the treatment field and esophagitis characterized as mild.  The patient also noted fatigue and a productive cough.  Unfortunately, he had to be hospitalized due to COVID infection December 20 through December 24 but was able to continue his radiation treatments while inpatient.                              The patient is very ill and requested we speak with his wife, [name]. On review of systems, obtained from [facility], the patient has continued to decline significantly and has recently made the decision to transition to comfort care only with Hospice.  Unfortunately, during treatment, he was diagnosed with COVID and hospitalized from 02/12/21 - 02/16/21.  He then went into atrial flutter requiring cardioversion on 03/04/2021.  The rate conversion was not sustained and he was readmitted on 03/29/2021 through 04/03/2021 with significant  weakness and dehydration.  He was discharged to [facility] on 04/03/2021 but had to be readmitted on 04/09/2021 due to severe shortness of breath and tachycardia.  He was stabilized and transferred back to [facility] on 04/15/2021.  While admitted, he had a large left pleural effusion which was drained.  On cytology from thoracentesis, there was no malignant cells identified.  The patient had a repeat CT scan on 04/10/21 which showed complete opacification of the left hemithorax by large pleural effusion with complete atelectasis of the left lung and question drowned lung appearance of the left lower lobe.  Again, the scan noted abrupt cut off of air within the left mainstem bronchus which may be related to mucous plugging versus tumor.  Due to the patient's overall poor performance, the patient was discharged with palliative care with the intent of possibly transitioning to hospice. He was scheduled for thoracentesis with Pleurex catheter placement on 04/26/21 with Dr. Tamala Julian but has decided that he does not want any further aggressive measures and instead wants to come home from rehab with Hospice care. They are planning to cancel the thoracentesis procedure.  ALLERGIES:  is allergic to morphine, aspirin, and codeine.  Meds: Current Outpatient Medications  Medication Sig Dispense Refill   acetaminophen (TYLENOL) 325 MG tablet Take 2 tablets (650 mg total) by mouth every 6 (six) hours as needed for mild pain (or Fever >/= 101).     albuterol (VENTOLIN HFA) 108 (  90 Base) MCG/ACT inhaler Inhale 2 puffs into the lungs every 6 (six) hours as needed for wheezing or shortness of breath.     Amino Acids-Protein Hydrolys (PRO-STAT) LIQD Take 30 mLs by mouth 2 (two) times daily.     amiodarone (PACERONE) 200 MG tablet Take 2 tablets (400 mg total) by mouth 2 (two) times daily for 5 days, THEN 1 tablet (200 mg total) 2 (two) times daily for 14 days, THEN 1 tablet (200 mg total) daily. 138 tablet 0   apixaban  (ELIQUIS) 5 MG TABS tablet Take 1 tablet (5 mg total) by mouth 2 (two) times daily. 60 tablet 3   atorvastatin (LIPITOR) 20 MG tablet Take 20 mg by mouth at bedtime.      buPROPion (WELLBUTRIN XL) 150 MG 24 hr tablet Take 150 mg by mouth daily.     calcium carbonate (TUMS - DOSED IN MG ELEMENTAL CALCIUM) 500 MG chewable tablet Chew 1 tablet by mouth daily.     calcium-vitamin D (OSCAL WITH D) 250-125 MG-UNIT per tablet Take 1 tablet by mouth daily.     Carbidopa-Levodopa ER (SINEMET CR) 25-100 MG tablet controlled release Take 1.5 tablets by mouth in the morning, at noon, and at bedtime. Take at 0800, 1400 & 2200     cetirizine (ZYRTEC) 10 MG tablet Take 10 mg by mouth daily.     Cholecalciferol (VITAMIN D-3) 25 MCG (1000 UT) CAPS Take 1 capsule by mouth daily.     digoxin (LANOXIN) 0.125 MG tablet Take 1 tablet (0.125 mg total) by mouth daily.     donepezil (ARICEPT) 10 MG tablet Take 1 tablet (10 mg total) by mouth at bedtime. 90 tablet 3   famotidine (PEPCID) 20 MG tablet Take 20 mg by mouth daily.     ferrous gluconate (FERGON) 324 MG tablet TAKE 1 TABLET BY MOUTH DAILY WITH BREAKFAST 90 tablet 3   formoterol (PERFOROMIST) 20 MCG/2ML nebulizer solution Take 2 mLs (20 mcg total) by nebulization 2 (two) times daily. 120 mL 5   Green Tea 315 MG CAPS Take 315 mg by mouth daily.     hydrOXYzine (ATARAX) 10 MG tablet Take 5 mg by mouth 2 (two) times daily.     ibuprofen (ADVIL) 800 MG tablet Take 800 mg by mouth 3 (three) times daily.     levalbuterol (XOPENEX) 0.63 MG/3ML nebulizer solution Take 0.63 mg by nebulization in the morning, at noon, and at bedtime.     Lidocaine 4 % PTCH Apply 2 patches topically daily.     lidocaine-prilocaine (EMLA) cream Apply 1 application topically as needed. 30 g 2   MEGARED OMEGA-3 KRILL OIL PO Take 1 capsule by mouth daily.     metFORMIN (GLUCOPHAGE-XR) 500 MG 24 hr tablet Take 500 mg by mouth 2 (two) times daily.     metoprolol tartrate (LOPRESSOR) 50 MG  tablet Take 1 tablet (50 mg total) by mouth 2 (two) times daily.     mirtazapine (REMERON) 15 MG tablet Take 15 mg by mouth at bedtime.     modafinil (PROVIGIL) 100 MG tablet Take 1 tablet (100 mg total) by mouth daily. 30 tablet 5   Multiple Vitamins-Minerals (MULTIVITAMIN WITH MINERALS) tablet Take 1 tablet by mouth daily. Centrum     nitroGLYCERIN (NITROSTAT) 0.4 MG SL tablet Place 0.4 mg under the tongue every 5 (five) minutes as needed for chest pain. (Patient not taking: Reported on 04/22/2021)     Omega-3 Fatty Acids (FISH OIL) 1000 MG CAPS  Take 1 capsule by mouth daily.     omeprazole (PRILOSEC OTC) 20 MG tablet Take 20 mg by mouth every evening.     OXYGEN Inhale 4-6 L into the lungs continuous.     Probiotic Product (PROBIOTIC DAILY) CAPS Take 1 capsule by mouth every other day.     prochlorperazine (COMPAZINE) 10 MG tablet Take 1 tablet (10 mg total) by mouth every 6 (six) hours as needed for nausea or vomiting. 30 tablet 0   sertraline (ZOLOFT) 100 MG tablet Take 100 mg by mouth daily.     Tiotropium Bromide Monohydrate (SPIRIVA RESPIMAT) 2.5 MCG/ACT AERS Inhale 2 puffs into the lungs daily.     vitamin C (ASCORBIC ACID) 500 MG tablet Take 500 mg by mouth daily.     No current facility-administered medications for this encounter.    Physical Findings:  vitals were not taken for this visit.  Pain Assessment Pain Score: 0-No pain/10 Unable to assess due to telephone follow up visit format.  Lab Findings: Lab Results  Component Value Date   WBC 6.8 04/19/2021   HGB 11.3 (L) 04/19/2021   HCT 37.7 (L) 04/19/2021   MCV 85.3 04/19/2021   PLT 361 04/19/2021     Radiographic Findings: DG Chest 1 View  Result Date: 04/10/2021 CLINICAL DATA:  Status post left thoracentesis. EXAM: CHEST  1 VIEW COMPARISON:  April 09, 2021. FINDINGS: Stable complete opacification of left hemithorax most likely due to combination of atelectasis and effusion. No definite pneumothorax is noted.  IMPRESSION: Stable complete opacification of left hemithorax most likely due to combination of atelectasis and effusion. No definite pneumothorax is noted status post left thoracentesis. Electronically Signed   By: Marijo Conception M.D.   On: 04/10/2021 12:27   CT HEAD WO CONTRAST  Result Date: 03/29/2021 CLINICAL DATA:  Altered mental status EXAM: CT HEAD WITHOUT CONTRAST TECHNIQUE: Contiguous axial images were obtained from the base of the skull through the vertex without intravenous contrast. RADIATION DOSE REDUCTION: This exam was performed according to the departmental dose-optimization program which includes automated exposure control, adjustment of the mA and/or kV according to patient size and/or use of iterative reconstruction technique. COMPARISON:  MRI 02/01/2021, CT 06/17/2019 FINDINGS: Brain: Normal anatomic configuration. Parenchymal volume loss is commensurate with the patient's age. Mild periventricular white matter changes are present likely reflecting the sequela of small vessel ischemia. No abnormal intra or extra-axial mass lesion or fluid collection. No abnormal mass effect or midline shift. No evidence of acute intracranial hemorrhage or infarct. Mild ventriculomegaly is stable and appears commensurate with the degree of parenchymal volume loss. Cerebellum unremarkable. Vascular: Prominence of the M1 segment of the left MCA is stable and unchanged from prior MRI examination. Otherwise, no asymmetric hyperdense vasculature at the skull base. Skull: Intact Sinuses/Orbits: Paranasal sinuses are clear. Ocular lenses have been removed. Orbits are otherwise unremarkable. Other: Mastoid air cells and middle ear cavities are clear. IMPRESSION: No acute intracranial abnormality. Stable senescent change.  Stable mild ventriculomegaly. Electronically Signed   By: Fidela Salisbury M.D.   On: 03/29/2021 19:02   CT CHEST W CONTRAST  Result Date: 04/10/2021 CLINICAL DATA:  Pleural effusion, suspected  malignancy EXAM: CT CHEST WITH CONTRAST TECHNIQUE: Multidetector CT imaging of the chest was performed during intravenous contrast administration. RADIATION DOSE REDUCTION: This exam was performed according to the departmental dose-optimization program which includes automated exposure control, adjustment of the mA and/or kV according to patient size and/or use of iterative reconstruction technique. CONTRAST:  69mL OMNIPAQUE IOHEXOL 300 MG/ML  SOLN COMPARISON:  11/17/2020 FINDINGS: Cardiovascular: Atherosclerotic calcifications aorta and coronary arteries. Aorta normal caliber. Vascular structures grossly patent. Heart unremarkable. No pericardial effusion. Mediastinum/Nodes: Esophagus normal appearance. Diffusely heterogeneous thyroid lobes with multiple nodules up to 15 mm diameter; This has been evaluated on previous imaging, thyroid ultrasound 02/14/2021. (Ref: J Am Coll Radiol. 2015 Feb;12(2): 143-50). Base of cervical region otherwise normal appearance. Few scattered normal size mediastinal lymph nodes without thoracic adenopathy. Lungs/Pleura: Complete opacification of the LEFT hemithorax by large pleural effusion. Complete atelectasis of LEFT lung. No definite pleural thickening or mass. No discrete pulmonary mass identified. Low-attenuation in LEFT lower lobe question drowned lung. Again identified abrupt cut off of air within LEFT mainstem bronchus, may be related to mucous plugging though cannot exclude tumor with this appearance. Mucus within RIGHT mainstem bronchus. Underlying emphysematous changes in RIGHT lung. Subpleural area of pleural thickening versus nodularity posterior RIGHT upper lobe 18 x 7 mm image 46, minimally more prominent than on previous exam. Peribronchial thickening with subsegmental atelectasis in RIGHT middle lobe and RIGHT lower lobe. No RIGHT lung infiltrate, pleural effusion, or pneumothorax. Upper Abdomen: Diffuse thickening of the adrenal glands. Minimal diverticulosis at  splenic flexure of colon. Remaining visualized upper abdomen unremarkable. Musculoskeletal: Diffuse osseous demineralization. IMPRESSION: Complete opacification of the LEFT hemithorax by large pleural effusion with complete atelectasis of LEFT lung and question drowned lung appearance of particularly the LEFT lower lobe. Again identified abrupt cut off of air within LEFT mainstem bronchus, may be related to mucous plugging though cannot exclude tumor with this appearance. Subpleural area of pleural thickening versus nodularity posterior RIGHT upper lobe 18 x 7 mm, minimally more prominent than on previous exam; recommend continued attention on follow-up imaging. Diffuse thickening of the adrenal glands bilaterally question adrenal hyperplasia. Coronary arterial calcifications. Diffusely heterogeneous thyroid lobes with multiple nodules up to 15 mm diameter; This has been evaluated on previous imaging, thyroid ultrasound 02/14/2021. Aortic Atherosclerosis (ICD10-I70.0) and Emphysema (ICD10-J43.9). Electronically Signed   By: Lavonia Dana M.D.   On: 04/10/2021 16:19   DG Chest Port 1 View  Result Date: 04/09/2021 CLINICAL DATA:  Flutter, weakness, tachycardia. EXAM: PORTABLE CHEST 1 VIEW COMPARISON:  03/29/2021. FINDINGS: The heart size and mediastinal contours are obscured. There is atherosclerotic calcification of the aorta. There is complete opacification of the left lung. Mild atelectasis is noted at the right lung base. No pneumothorax. A stable right chest port is noted. No acute osseous abnormality. IMPRESSION: Complete opacification of the left lung, possibly representing a moderate to large pleural effusion with atelectasis or infiltrate. Findings may be associated with postobstructive pneumonitis related to known left perihilar mass. Electronically Signed   By: Brett Fairy M.D.   On: 04/09/2021 03:03   DG Chest Portable 1 View  Result Date: 03/29/2021 CLINICAL DATA:  Generalized weakness. EXAM:  PORTABLE CHEST 1 VIEW COMPARISON:  Chest x-rays dated 02/18/2021 and 02/11/2021. Chest CT dated 11/17/2020. PET-CT dated 12/26/2020. FINDINGS: Heart size and mediastinal contours are grossly stable. 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 RIGHT lung is clear. RIGHT chest wall Port-A-Cath in place with tip adequately positioned at the level of the mid/lower SVC. Osseous structures about the chest are unremarkable. IMPRESSION: 1. No significant change compared to the chest x-ray of 02/18/2021. Again noted is a  LEFT pleural effusion and airspace opacities throughout the LEFT lung with volume loss, compatible with the postobstructive changes related to the LEFT perihilar mass as demonstrated on earlier chest CT and PET-CT. 2. No new findings seen.  RIGHT lung is clear. Electronically Signed   By: Franki Cabot M.D.   On: 03/29/2021 18:37   IR THORACENTESIS ASP PLEURAL SPACE W/IMG GUIDE  Result Date: 04/10/2021 INDICATION: Patient with history of non-small cell lung cancer, COPD, Parkinson's disease, atrial flutter, left pleural effusion. Request received for diagnostic and therapeutic left thoracentesis. EXAM: ULTRASOUND GUIDED DIAGNOSTIC AND THERAPEUTIC LEFT THORACENTESIS MEDICATIONS: 10 ml 1% lidocaine COMPLICATIONS: None immediate. PROCEDURE: An ultrasound guided thoracentesis was thoroughly discussed with the patient and questions answered. The benefits, risks, alternatives and complications were also discussed. The patient understands and wishes to proceed with the procedure. Written consent was obtained. Ultrasound was performed to localize and mark an adequate pocket of fluid in the left chest. The area was then prepped and draped in the normal sterile fashion. 1% Lidocaine was used for local anesthesia. Under  ultrasound guidance a 6 Fr Safe-T-Centesis catheter was introduced. Thoracentesis was performed. The catheter was removed and a dressing applied. FINDINGS: A total of approximately 2 liters of amber/blood-tinged fluid was removed. Samples were sent to the laboratory as requested by the clinical team. IMPRESSION: Successful ultrasound guided diagnostic and therapeutic left thoracentesis yielding 2 liters of pleural fluid. Read by: Rowe Robert, PA-C Electronically Signed   By: Corrie Mckusick D.O.   On: 04/10/2021 13:25    Impression/Plan: 3. 76 yo male with  stage IIIa/IV (T3, N1, M0/M1a) squamous cell carcinoma, of the Left Lower Lobe of the lung - with left hilar adenopathy and suspicious but not confirmed malignant right hip lesion. The patient has unfortunately had a persistent decline in his health and functional status and has recently made the decision to transition to comfort care only with hospice in his home.  I offered our heartfelt sympathy and the difficult days that are ahead but they know that they are welcome to call at anytime with any questions or concerns related to the radiotherapy.  We enjoyed taking care of him and look forward to following his progress.    Nicholos Johns, PA-C

## 2021-04-25 NOTE — Patient Outreach (Signed)
[facility] Post- Acute Care Coordinator follow up. Per [facility] eligible member resides in [facility].  Screened for potential post SNF care coordination services. ? ?Member's PCP at [facility] has [facility] care management services available if needed.  ? ?Update received from [place], [place] SW indicating family is deciding whether they are going to take member home with hospice. ? ?Will continue to follow while in SNF. ? ? ? ?Marthenia Rolling, MSN, RN,BSN ?[facility] Coordinator ?[phone] [facility]) ?[phone]  (Toll free office)   ?

## 2021-04-25 NOTE — Progress Notes (Signed)
°  Radiation Oncology         [phone]) [phone] ________________________________  Name: James [name] MRN: [id number]  Date: 03/27/2021  DOB: 04-Feb-1946   End of Treatment Note  Diagnosis:   76 yo male with  stage IIIa/IV (T3, N1, M0/M1a) squamous cell carcinoma, of the Left Lower Lobe of the lung - with left hilar adenopathy and suspicious but not confirmed malignant right hip lesion.  Indication for treatment:  Curative, Chemo-Radiotherapy       Radiation treatment dates:   02/04/21 - 03/27/21  Site/dose:   The primary tumor and involved mediastinal adenopathy were treated to 66 Gy in 33 fractions of 2 Gy; concurrent with chemotherapy.  Beams/energy:   A five field 3D conformal treatment arrangement was used delivering 6 and 10 MV photons.  Daily image-guidance CT was used to align the treatment with the targeted volume  Narrative: The patient tolerated radiation treatment relatively well.  He experienced some  skin irritation in the treatment field and esophagitis characterized as mild.  The patient also noted fatigue and a productive cough.  Unfortunately, he had to be hospitalized due to COVID infection December 20 through December 24 but was able to continue his radiation treatments while inpatient.  Plan: The patient has completed radiation treatment. The patient will return to radiation oncology clinic for routine followup in one month. I advised him to call or return sooner if he has any questions or concerns related to his recovery or treatment.  ________________________________  Sheral Apley. Tammi Klippel, M.D.

## 2021-04-25 NOTE — Telephone Encounter (Signed)
Spoke with Blanch Media who wants to cancel thoracentesis scheduled for tomorrow. Spoke with Dawn in Endo and canceled procedure. Nothing further needed at this time.  ? ?Routing to Dr. Valeta Harms as Juluis Rainier   ?

## 2021-04-26 ENCOUNTER — Encounter (HOSPITAL_COMMUNITY): Admission: RE | Payer: Self-pay | Source: Home / Self Care

## 2021-04-26 ENCOUNTER — Encounter: Payer: Self-pay | Admitting: Internal Medicine

## 2021-04-26 ENCOUNTER — Telehealth: Payer: Self-pay | Admitting: Internal Medicine

## 2021-04-26 ENCOUNTER — Ambulatory Visit (HOSPITAL_COMMUNITY): Admission: RE | Admit: 2021-04-26 | Payer: Medicare Other | Source: Home / Self Care | Admitting: Internal Medicine

## 2021-04-26 DIAGNOSIS — J449 Chronic obstructive pulmonary disease, unspecified: Secondary | ICD-10-CM | POA: Diagnosis not present

## 2021-04-26 DIAGNOSIS — C3492 Malignant neoplasm of unspecified part of left bronchus or lung: Secondary | ICD-10-CM | POA: Diagnosis not present

## 2021-04-26 DIAGNOSIS — I4892 Unspecified atrial flutter: Secondary | ICD-10-CM | POA: Diagnosis not present

## 2021-04-26 DIAGNOSIS — E118 Type 2 diabetes mellitus with unspecified complications: Secondary | ICD-10-CM | POA: Diagnosis not present

## 2021-04-26 DIAGNOSIS — G20 Parkinson's disease: Secondary | ICD-10-CM | POA: Diagnosis not present

## 2021-04-26 DIAGNOSIS — J9610 Chronic respiratory failure, unspecified whether with hypoxia or hypercapnia: Secondary | ICD-10-CM | POA: Diagnosis not present

## 2021-04-26 DIAGNOSIS — E611 Iron deficiency: Secondary | ICD-10-CM | POA: Diagnosis not present

## 2021-04-26 DIAGNOSIS — J90 Pleural effusion, not elsewhere classified: Secondary | ICD-10-CM | POA: Diagnosis not present

## 2021-04-26 SURGERY — THORACENTESIS
Anesthesia: LOCAL

## 2021-04-26 NOTE — Telephone Encounter (Signed)
Irine Seal, Education officer, museum at [employer], called and emailed to let me know that upon further discussion at the facility about Mr. Mcshan care, Mrs. Elie had decided to discontinue the planned "surgery"/thoracentesis and to take her husband home with hospice care on Monday, 04/29/21.  She also sent me a list of DME needs as determined by the team at the facility.  Referral will be entered with anticipated date of admission of Mon, 04/29/21 at pt's home.  DME list will be sent in referral email. ?

## 2021-04-26 NOTE — Progress Notes (Signed)
[place] Initial Consult Note   Telephone: [phone]    Fax: [phone]       Date of encounter: 04/24/21   PATIENT NAME: James [name]   DOB: 12-26-1945   Location of Service:  [facility]   PRIMARY CARE PROVIDER:  Dr. Jules Husbands   REFERRING PROVIDER: Dr. 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   I met face to face with patient and family. Palliative Care was asked to follow this patient by consultation request of Dr. Jules Husbands to address advance care planning, goals of care and symptom management. This is the initial visit.    ASSESSMENT AND PLAN / RECOMMENDATIONS:    Advance Care Planning/Goals of Care: Goals include to maximize quality of life and symptom management. Our advance care planning conversation included a discussion about: ??   The value and importance of advance care planning?   Experiences with loved ones who have been seriously ill or have died?   Exploration of personal, cultural or spiritual beliefs that might influence medical decisions?   Exploration of goals of care in the event of a sudden injury or illness?   Identification and preparation of a healthcare agent?   Review and updating or creation of advance directive documents.   Decision not to resuscitate or to de-escalate disease focused treatments due to poor prognosis.    CODE STATUS: MOST: Desired resuscitation with limited scope interventions, ABX to be determined at time of infection, trial IVF, and no feeding tube. Discussed patient and wife's wish to be comfortable and counseled on limitations and potential harms of CPR in patient's current condition. Patient maintains that he still wants CPR currently but does not want  intubation. Will need ongoing discussion as patient transitions to hospice level of care.      Symptom Management/Plan:   Malignant [place] Lung Neoplasm: diagnosed in December 2022. Chemoradiation 02/26/21-03/19/21, discontinued due to functional decline. CT scan on 04/10/21 showed complete left lung opacity, concerning for mucus plug vs. tumor invasion. Thoracentesis on 2/15 - drained 2L fluid. CXR repeat showed left lung opacity unchanged. Pulmonology consulted but no bronchoscopy recommended due to severe debility. Dr. Tammi Klippel and Dr. Julien Nordmann have indicated that patient is not strong enough for any further treatment at this time. Patient is on continuous 02 @ 4L [place] and uses Spiriva Respimat daily, Xopenex TID, and Albuterol inhaler Q6H PRN for dyspnea management. Facility staff will assist patient to use IS as tolerated and check 02 sats Qshift and PRN. Plan is to have OP thoracentesis scheduled on 04/26/21, then referral for hospice once patient discharges home.    Paroxsymal A-flutter with RVR: diagnosed in December 2022. Started on Diltiazem, Metoprolol, and Eliquis. Underwent TEE with DCCV on 03/04/21. Noted again with 03/29/21 hospitalization for weakness with recurrent falls, continued on medications to maintain HR. Hospitalized 04/09/21 with A-flutter RVR again - started on Amiodarone and Digoxin. HR's have been 52-104 since returning to SNF. Plan is to continue Amiodarone BID, Diltiazem TID, Digoxin daily, Eliquis BID, and Toprol XL daily. There has been some concern regarding orthostatic hypotension. Facility staff and PT/OT to check HR and BP's Qshift, with therapy, and PRN.    Abnormal weight loss: Max weight was 240 lbs one year ago. Current  weight 171.6 (04/19/21) with height 6'1" and BMI 22.6. Poor appetite varies with food choices, likes candy, cookies, and snack foods. CBG's 98-172 in the last month and A1C 5. Plan is to allow patient to make food choices to encourage intake. He is also on Prostat BID.     Debility: Functional decline with multiple complex medical comorbidities. Recurrent falls at home and poor progress with PT/OT in SNF. Requires two assist to transfer from lying to sitting on side of bed. Requires sit-stand lift for transfers OOB. One episode of bowel incontinence today due to difficulty of arranging transfer from bed to bathroom. Requested BSC but even this may be difficult to manage. Continue fall precautions, low bed. Plan is to continue PT/OT while in SNF, then discharge home with hospice. Will need to arrange for DME in home.   Thank you for the opportunity to participate in the care of James [name]. ?The palliative care team will continue to follow for complex medical decision making, advance care planning, and clarification of goals.  Return 1 week or prn. Please call our office at [phone] if we can be of additional assistance.     This visit was coded based on medical decision making (MDM).  44 minutes spent on ACP.  COVID-19 PATIENT SCREENING TOOL   Asked and negative response unless otherwise noted:   Have you had symptoms of covid, tested positive or been in contact with someone with   symptoms/positive test in the past 5-10 days? no      Chief Complaint: initial palliative care consult.   HISTORY OF PRESENT ILLNESS:  This is a 76 y/o male with a past medical history significant for non-small cell lung cancer, diagnosed in December 2022, PD, dementia, COPD, HTN, HLD, CAD, A-flutter with RVR, OA, and left eye blindness. seen for palliative care consult to establish goals of care, discuss advanced care planning, and symptom management at the request of Dr. Jules Husbands. Patient was living at home with his wife and had increasing weakness with substantial weight loss over the last year. Diagnosed in December 2022 with Stage IIIa/IV (T3, N1, M0/M1a)?that presented with central area obstructive left lung mass, probably of left hilar adenopathy and suspicious for  malignant pleural effusion?and right hip lesion. Prior to starting treatment, patient went to ED on 02/11/22 for tachycardia and was found to be COVID positive and diagnosed with A-flutter RVR started on Eliquis. He was started on Diltiazem, Metoprolol, and Eliquis. He underwent TEE with DCCV on 03/04/21. Started chem, radiation on 02/26/21 and treatment was discontinued on 03/19/21 after completion of 33 radiation treatments, due to functional decline. He was having multiple falls at home and was hospitalized from 03/29/21-04/03/21 for dehydration, weakness, UTI and A-flutter with RVR - treated and released to [facility] for rehab. Patient was hospitalized again 04/09/21-04/15/21 with A-flutter RVR and was started on Amiodarone and Digoxin. Chest CT on 04/10/21 with complete opacification left hemithorax with abrupt cut off left mainstem bronchus concerning for mucous plug versus tumor invasion.?IR thoracentesis drew off 2L amber-blood-tinged fluid. Repeat chest x-ray showed continued complete opacification of the left hemithorax. Pulmonology consulted for bronchoscopy; however, this was not recommended due to patient's debility. Patient discharged back to [facility] for PT/OT strengthening with a goal of going back home with palliative services. He has another thoracentesis scheduled on 04/26/21.   Met with patient and his wife today. Patient is sleeping most the time but wakes easily and can converse and make his wishes  know, although he is having fluctuations of lucidity and confusion/disorientation. He is also having some incidents of irritable behavior. Patient denies having any pain currently. He has mild dyspnea on exertion and requires continuous 02 @ 4L [place]. His appetite has varied but poor overall, his max weight was 240lbs about one year ago and is 171.4lbs on 04/19/21. He has a history of diarrhea with anastomosis of large intestine, which worsened with chemoradiation, and had an incontinent bowel episode  this morning. He has made poor progress with PT/OT, and he reports feeling severe weakness and fatigue. He requires a two person assist to transfer from lying to sitting and has not been able to stand without sit-stand lift. Patient verbalized his wish to return home. Wife agrees with this plan and understands that patient is not strong enough to endure any further treatments and that he is hospice eligible. She would still want him to go to have thoracentesis on 04/26/21 and continue PT/OT this week, but then states she will be ready to bring him home with hospice plan of care.    History obtained from review of EMR, discussion with primary team, and interview with family, facility staff/caregiver and/or patient.    I reviewed available labs, medications, imaging, studies and related documents from the EMR.  Records reviewed and summarized above.    ROS   General: NAD   EYES: denies vision changes, legally blind in left eye   ENMT: denies dysphagia   Cardiovascular: denies chest pain, mild DOE with PT/OT   Pulmonary: denies cough, denies increased SOB   Abdomen: poor appetite, intake varies with food choices, prefers sweets and snacks, chronic diarrhea, endorses fecal urgency and one time episode of bowel incontinence this morning.   GU: denies dysuria, endorses continence of urine   MSK:  severe generalized weakness and fatigue, history of recurrent falls reported, none in SNF.   Skin: denies rashes or wounds  Neurological: denies pain, denies insomnia, increased daytime sleeping   Psych: cooperative with occasaional irritable mood   Heme/lymph/immuno: denies bruises, abnormal bleeding      Physical Exam:   Current and past weights: 171.6 (04/19/21), 184.6 (04/04/21) - 240 (12/21)   Constitutional: NAD   General: frail appearing, thin    EYES: anicteric sclera, lids intact, no discharge    ENMT: intact hearing, oral mucous membranes dry, dentition poor with missing teeth.    CV: S1S2, RRR, no LE edema   Pulmonary: LCTA, diminished throughout left side, no increased work of breathing at rest, but noted mild labored breathing with exertion of transition from sitting to lying, occasional dry cough, 02 @ 4L [place] continuous.   Abdomen: intake varies from bites only to 50%, normo-active BS + 4 quadrants, soft and non-tender, no ascites   GU: deferred   MSK:  sarcopenia BUE, moves all extremities, non-ambulatory - requires 2 persona assist to transition from lying to sitting and back.   Skin: warm and dry with multiple seborrheic keratosis on neck and back, no rashes or wounds on visible skin   Neuro: generalized weakness and fatigue, fluctuating cognitive impairment consistent with delirium.   Psych: non-anxious affect, irritable at times, LOC and orientation fluctuates.   Hem/lymph/immuno: no widespread bruising      CURRENT PROBLEM LIST reviewed as in hpi   PAST MEDICAL HISTORY:   Parkinson's disease ([place]) 2014   Tubular adenoma of colon 2013   Seasonal Allergy    Adult FTT   Atrial flutter with RVR  Bacteriuria   Blind left eye    Blood transfusion    Cognitive, communication deficit   COPD (chronic obstructive pulmonary disease) (HCC)   Chronic respiratory failure with hypoxia   Depression   Dehydration   Diabetes mellitus, type 2 (HCC)   Diverticulosis   Dyspnea   ED (erectile dysfunction)   Glaucoma   Hyperlipidemia   Hypertension   Hypokalemia   Hypomagnesemia   Insomnia   Internal and external bleeding hemorrhoids   Iron deficiency anemia   Lung cancer (HCC)   Muscle wasting and atrophy   Muscle weakness   Nontoxic single thyroid nodule   Osteoarthritis   Other thrombophilia   Paroxysmal atrial flutter (HCC)   Pleural effusion   Rosacea   Tremor of both hands   SOCIAL HX: Married x 42 years, lived at home with wife prior to hospitalizations and SNF for rehab    Former Smoker -Quit  04/30/2015   Types Cigarettes quit in 04/30/2015   Amount 1.50 packs/day for 50.00 years; Pack years: 75.00   Smokeless Tobacco Status: Never   Comment: Former smoker and stop sneaking cigarettes 03/14/21      FAMILY HX:   Father (Deceased)   CAD      Alcohol abuse      Heart disease   Mother (Deceased)   COPD      Asthma      Emphysema   Maternal Grandmother   Cancer         ALLERGIES:    Morphine - itching/nausea   ASA - GI upset   Codeine - itching   CURRENT MEDICATIONS:    Acetaminophen 325mg  2 tabs PO Q6H PRN for pain   Albuterol sulfate 90 mcg/actuation HFA aerosol inhaler - 2 puffs inhalation Q6H PRN for wheezing or SOB   Amiodarone 200mg  1 tab PO BID for A-flutter   Atorvastatin 20mg  1 tab PO QHS for HLD   Carbidopa-levadopa ER 25-100mg  1.5 tabs PO TID for PD   Cardizem 30mg  1 tab PO TID PRN for HR > 105   Cetirizine 10mg  1 tab PO daily for allergies   Digoxin 125 mcg 1 tab PO daily for A-flutter   Donepezil 10mg  1 tab PO daily for dementia   Eliquis 5mg  1 tab PO BID for A-flutter   Ferrous sulfate EC 325mg  1 tab PO daily for supplement   Fish Oil (Omega 3-dha-epa-fish oil) 300-1,000mg  1 cap PO daily for supplement   Hydroxyzine 10mg   tab PO BID for allergies   Ibuprofen 800mg  PO Q8H PRN for moderate pain   Lidocaine 4% patch - apply one patch daily to left side under axilla and one patch daily to left abdominal wall - remove QHS - for pain   Mirtazapine 15mg  1 tab PO QHS for depression   Modafinil 100mg  1 tab daily for wakefulness   Multivitamin with min-folic acid 0.4mg   1 tab daily for supplement   Nitrostat 0.4mg  1 tab SL Q5Min PRN x 3 for chest pain - if three doses ineffective call provider   Pepcid 20mg  1 tab daily for GERD   Pro-Stat AWC 17-100 gram-kcal/71ml - 30 ml PO BID for weight loss   Probiotic 10 billion cell capsule 1 cap PO QOD for diarrhea   Prochlorperazine 10mg  1 tab Q6H PRN for  nausea/vomiting   Sertraline 100mg  1 tab PO daily for depression   Sertraline 50 mg 1 tab PO daily (to make 150mg  total) for depression   Spiriva Respimat 2.64mcg  2 inhalations daily for COPD   Toprol XL 50 mg 1 tab PO daily for HR   TUMS $Rem'200mg'auYl$  1 TUM PO daily for supplement   Vitamin C $RemoveB'500mg'rJaQNBoo$  1 PO daily for supplement   Vitamin D3 25 mcg 1 tab PO daily for supplement   Xopenex 0.63/55ml solution 1 inhalation TID PRN for COPD      See Assessment/Plan in the beginning of the note.   Therron Sells L. Vernon Prey, CMD   Palliative Care   Phone:  [phone]   [email or web address]

## 2021-04-29 ENCOUNTER — Telehealth: Payer: Self-pay | Admitting: Pulmonary Disease

## 2021-04-29 NOTE — Telephone Encounter (Signed)
Please advise 

## 2021-04-30 ENCOUNTER — Other Ambulatory Visit: Payer: Self-pay | Admitting: *Deleted

## 2021-04-30 ENCOUNTER — Ambulatory Visit: Payer: Medicare Other | Admitting: Neurology

## 2021-04-30 DIAGNOSIS — J449 Chronic obstructive pulmonary disease, unspecified: Secondary | ICD-10-CM | POA: Diagnosis not present

## 2021-04-30 DIAGNOSIS — K644 Residual hemorrhoidal skin tags: Secondary | ICD-10-CM | POA: Diagnosis not present

## 2021-04-30 DIAGNOSIS — R634 Abnormal weight loss: Secondary | ICD-10-CM | POA: Diagnosis not present

## 2021-04-30 DIAGNOSIS — E119 Type 2 diabetes mellitus without complications: Secondary | ICD-10-CM | POA: Diagnosis not present

## 2021-04-30 DIAGNOSIS — H409 Unspecified glaucoma: Secondary | ICD-10-CM | POA: Diagnosis not present

## 2021-04-30 DIAGNOSIS — K219 Gastro-esophageal reflux disease without esophagitis: Secondary | ICD-10-CM | POA: Diagnosis not present

## 2021-04-30 DIAGNOSIS — Z87891 Personal history of nicotine dependence: Secondary | ICD-10-CM | POA: Diagnosis not present

## 2021-04-30 DIAGNOSIS — J302 Other seasonal allergic rhinitis: Secondary | ICD-10-CM | POA: Diagnosis not present

## 2021-04-30 DIAGNOSIS — I25119 Atherosclerotic heart disease of native coronary artery with unspecified angina pectoris: Secondary | ICD-10-CM | POA: Diagnosis not present

## 2021-04-30 DIAGNOSIS — G20 Parkinson's disease: Secondary | ICD-10-CM | POA: Diagnosis not present

## 2021-04-30 DIAGNOSIS — I4891 Unspecified atrial fibrillation: Secondary | ICD-10-CM | POA: Diagnosis not present

## 2021-04-30 DIAGNOSIS — D630 Anemia in neoplastic disease: Secondary | ICD-10-CM | POA: Diagnosis not present

## 2021-04-30 DIAGNOSIS — Z8616 Personal history of COVID-19: Secondary | ICD-10-CM | POA: Diagnosis not present

## 2021-04-30 DIAGNOSIS — J9610 Chronic respiratory failure, unspecified whether with hypoxia or hypercapnia: Secondary | ICD-10-CM | POA: Diagnosis not present

## 2021-04-30 DIAGNOSIS — I10 Essential (primary) hypertension: Secondary | ICD-10-CM | POA: Diagnosis not present

## 2021-04-30 DIAGNOSIS — I4892 Unspecified atrial flutter: Secondary | ICD-10-CM | POA: Diagnosis not present

## 2021-04-30 DIAGNOSIS — F0283 Dementia in other diseases classified elsewhere, unspecified severity, with mood disturbance: Secondary | ICD-10-CM | POA: Diagnosis not present

## 2021-04-30 DIAGNOSIS — K5790 Diverticulosis of intestine, part unspecified, without perforation or abscess without bleeding: Secondary | ICD-10-CM | POA: Diagnosis not present

## 2021-04-30 DIAGNOSIS — C3432 Malignant neoplasm of lower lobe, left bronchus or lung: Secondary | ICD-10-CM | POA: Diagnosis not present

## 2021-04-30 DIAGNOSIS — E785 Hyperlipidemia, unspecified: Secondary | ICD-10-CM | POA: Diagnosis not present

## 2021-04-30 NOTE — Telephone Encounter (Signed)
Called wife and changed in office visit to a mychart video visit since patient is unable to come in the office ? ?Nothing further  ?

## 2021-04-30 NOTE — Patient Outreach (Signed)
[place] Coordinator follow up. Verified in [facility] James [name] discharged from [facility] on 04/29/21. Plan was to go home with hospice services.  ? ?No identifiable care management needs at this time.  ? ? ?Marthenia Rolling, MSN, RN,BSN ?[place] Coordinator ?[phone] [facility]) ?[phone]  (Toll free office)   ?

## 2021-05-01 DIAGNOSIS — J9610 Chronic respiratory failure, unspecified whether with hypoxia or hypercapnia: Secondary | ICD-10-CM | POA: Diagnosis not present

## 2021-05-01 DIAGNOSIS — R634 Abnormal weight loss: Secondary | ICD-10-CM | POA: Diagnosis not present

## 2021-05-01 DIAGNOSIS — J449 Chronic obstructive pulmonary disease, unspecified: Secondary | ICD-10-CM | POA: Diagnosis not present

## 2021-05-01 DIAGNOSIS — C3432 Malignant neoplasm of lower lobe, left bronchus or lung: Secondary | ICD-10-CM | POA: Diagnosis not present

## 2021-05-01 DIAGNOSIS — I4891 Unspecified atrial fibrillation: Secondary | ICD-10-CM | POA: Diagnosis not present

## 2021-05-01 DIAGNOSIS — I4892 Unspecified atrial flutter: Secondary | ICD-10-CM | POA: Diagnosis not present

## 2021-05-02 ENCOUNTER — Telehealth (INDEPENDENT_AMBULATORY_CARE_PROVIDER_SITE_OTHER): Payer: Medicare Other | Admitting: Pulmonary Disease

## 2021-05-02 ENCOUNTER — Other Ambulatory Visit: Payer: Self-pay

## 2021-05-02 DIAGNOSIS — R918 Other nonspecific abnormal finding of lung field: Secondary | ICD-10-CM

## 2021-05-02 NOTE — Progress Notes (Signed)
Virtual Visit via Telephone Note ? ?I connected with James [name] on 05/02/21 at  2:30 PM EST by telephone and verified that I am speaking with the correct person using two identifiers. ? ?Location: ?Patient: Home  ?Provider: Office ?  ?I discussed the limitations, risks, security and privacy concerns of performing an evaluation and management service by telephone and the availability of in person appointments. I also discussed with the patient that there may be a patient responsible charge related to this service. The patient expressed understanding and agreed to proceed. ? ? ?History of Present Illness: ? ?This is a 76 year old gentleman, past medical history of hypertension hyperlipidemia, COPD, diabetes.Patient was taken on 01/28/2021 for bronchoscopy.  Patient was found to have a large obstructing mass.Large component of left-sided mainstem obstruction from tumor.  Were successful in opening up the left upper lobe.  The left lower lobe was fully obstructed with mass.  Patient has subsequently establish care with Dr. Earlie Server.  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  Small pleural effusion not confirmed malignant. ?  ?From respiratory standpoint he still having some shortness of breath.  Unfortunately he was recently admitted to the hospital with atrial flutter.  He has started his radiation treatments.  Unfortunately has not been able to start his chemotherapy treatments yet.  He has follow-up with medical oncology this week.  He is out of the hospital and his rate is better controlled at this time.  Still has some chest pressure on occasion and shortness of breath.  He was started on anticoagulation with Eliquis.  Has not had any additional hemoptysis. ? ?OV 05/02/2021: Virtual visit today.  Unfortunately patient had ongoing decline and weakness.  Decision was made for enrollment in home hospice.  He is currently in home hospice.  I was able  to communicate and discussed with him and his wife to see how he is doing.  He does appear very comfortable at this time.  We are appreciative of hospice care. ? ?Observations/Objective: ?Appears comfortable ? ?Assessment and Plan: ?Advanced age lung cancer, stage IV disease. ? ?Plan: ?Continue ongoing hospice care per hospice agency. ?We appreciate their support of the family. ? ?Follow Up Instructions: ? ?  ?I discussed the assessment and treatment plan with the patient. The patient was provided an opportunity to ask questions and all were answered. The patient agreed with the plan and demonstrated an understanding of the instructions. ?  ?The patient was advised to call back or seek an in-person evaluation if the symptoms worsen or if the condition fails to improve as anticipated. ? ? ?[name], DO ? ?

## 2021-05-05 DIAGNOSIS — J9610 Chronic respiratory failure, unspecified whether with hypoxia or hypercapnia: Secondary | ICD-10-CM | POA: Diagnosis not present

## 2021-05-05 DIAGNOSIS — I4892 Unspecified atrial flutter: Secondary | ICD-10-CM | POA: Diagnosis not present

## 2021-05-05 DIAGNOSIS — I4891 Unspecified atrial fibrillation: Secondary | ICD-10-CM | POA: Diagnosis not present

## 2021-05-05 DIAGNOSIS — R634 Abnormal weight loss: Secondary | ICD-10-CM | POA: Diagnosis not present

## 2021-05-05 DIAGNOSIS — J449 Chronic obstructive pulmonary disease, unspecified: Secondary | ICD-10-CM | POA: Diagnosis not present

## 2021-05-05 DIAGNOSIS — C3432 Malignant neoplasm of lower lobe, left bronchus or lung: Secondary | ICD-10-CM | POA: Diagnosis not present

## 2021-05-06 DIAGNOSIS — J9610 Chronic respiratory failure, unspecified whether with hypoxia or hypercapnia: Secondary | ICD-10-CM | POA: Diagnosis not present

## 2021-05-06 DIAGNOSIS — C3432 Malignant neoplasm of lower lobe, left bronchus or lung: Secondary | ICD-10-CM | POA: Diagnosis not present

## 2021-05-06 DIAGNOSIS — I4892 Unspecified atrial flutter: Secondary | ICD-10-CM | POA: Diagnosis not present

## 2021-05-06 DIAGNOSIS — I4891 Unspecified atrial fibrillation: Secondary | ICD-10-CM | POA: Diagnosis not present

## 2021-05-06 DIAGNOSIS — R634 Abnormal weight loss: Secondary | ICD-10-CM | POA: Diagnosis not present

## 2021-05-06 DIAGNOSIS — J449 Chronic obstructive pulmonary disease, unspecified: Secondary | ICD-10-CM | POA: Diagnosis not present

## 2021-05-07 DIAGNOSIS — J449 Chronic obstructive pulmonary disease, unspecified: Secondary | ICD-10-CM | POA: Diagnosis not present

## 2021-05-07 DIAGNOSIS — I4892 Unspecified atrial flutter: Secondary | ICD-10-CM | POA: Diagnosis not present

## 2021-05-07 DIAGNOSIS — I4891 Unspecified atrial fibrillation: Secondary | ICD-10-CM | POA: Diagnosis not present

## 2021-05-07 DIAGNOSIS — R634 Abnormal weight loss: Secondary | ICD-10-CM | POA: Diagnosis not present

## 2021-05-07 DIAGNOSIS — C3432 Malignant neoplasm of lower lobe, left bronchus or lung: Secondary | ICD-10-CM | POA: Diagnosis not present

## 2021-05-07 DIAGNOSIS — J9610 Chronic respiratory failure, unspecified whether with hypoxia or hypercapnia: Secondary | ICD-10-CM | POA: Diagnosis not present

## 2021-05-08 DIAGNOSIS — C3432 Malignant neoplasm of lower lobe, left bronchus or lung: Secondary | ICD-10-CM | POA: Diagnosis not present

## 2021-05-08 DIAGNOSIS — R634 Abnormal weight loss: Secondary | ICD-10-CM | POA: Diagnosis not present

## 2021-05-08 DIAGNOSIS — J449 Chronic obstructive pulmonary disease, unspecified: Secondary | ICD-10-CM | POA: Diagnosis not present

## 2021-05-08 DIAGNOSIS — J9610 Chronic respiratory failure, unspecified whether with hypoxia or hypercapnia: Secondary | ICD-10-CM | POA: Diagnosis not present

## 2021-05-08 DIAGNOSIS — I4891 Unspecified atrial fibrillation: Secondary | ICD-10-CM | POA: Diagnosis not present

## 2021-05-08 DIAGNOSIS — I4892 Unspecified atrial flutter: Secondary | ICD-10-CM | POA: Diagnosis not present

## 2021-05-10 DIAGNOSIS — C3432 Malignant neoplasm of lower lobe, left bronchus or lung: Secondary | ICD-10-CM | POA: Diagnosis not present

## 2021-05-10 DIAGNOSIS — I4892 Unspecified atrial flutter: Secondary | ICD-10-CM | POA: Diagnosis not present

## 2021-05-10 DIAGNOSIS — J9610 Chronic respiratory failure, unspecified whether with hypoxia or hypercapnia: Secondary | ICD-10-CM | POA: Diagnosis not present

## 2021-05-10 DIAGNOSIS — I4891 Unspecified atrial fibrillation: Secondary | ICD-10-CM | POA: Diagnosis not present

## 2021-05-10 DIAGNOSIS — J449 Chronic obstructive pulmonary disease, unspecified: Secondary | ICD-10-CM | POA: Diagnosis not present

## 2021-05-10 DIAGNOSIS — R634 Abnormal weight loss: Secondary | ICD-10-CM | POA: Diagnosis not present

## 2021-05-11 DIAGNOSIS — C3432 Malignant neoplasm of lower lobe, left bronchus or lung: Secondary | ICD-10-CM | POA: Diagnosis not present

## 2021-05-11 DIAGNOSIS — R634 Abnormal weight loss: Secondary | ICD-10-CM | POA: Diagnosis not present

## 2021-05-11 DIAGNOSIS — J9610 Chronic respiratory failure, unspecified whether with hypoxia or hypercapnia: Secondary | ICD-10-CM | POA: Diagnosis not present

## 2021-05-11 DIAGNOSIS — J449 Chronic obstructive pulmonary disease, unspecified: Secondary | ICD-10-CM | POA: Diagnosis not present

## 2021-05-11 DIAGNOSIS — I4891 Unspecified atrial fibrillation: Secondary | ICD-10-CM | POA: Diagnosis not present

## 2021-05-11 DIAGNOSIS — I4892 Unspecified atrial flutter: Secondary | ICD-10-CM | POA: Diagnosis not present

## 2021-05-12 DIAGNOSIS — R634 Abnormal weight loss: Secondary | ICD-10-CM | POA: Diagnosis not present

## 2021-05-12 DIAGNOSIS — J449 Chronic obstructive pulmonary disease, unspecified: Secondary | ICD-10-CM | POA: Diagnosis not present

## 2021-05-12 DIAGNOSIS — J9610 Chronic respiratory failure, unspecified whether with hypoxia or hypercapnia: Secondary | ICD-10-CM | POA: Diagnosis not present

## 2021-05-12 DIAGNOSIS — I4891 Unspecified atrial fibrillation: Secondary | ICD-10-CM | POA: Diagnosis not present

## 2021-05-12 DIAGNOSIS — I4892 Unspecified atrial flutter: Secondary | ICD-10-CM | POA: Diagnosis not present

## 2021-05-12 DIAGNOSIS — C3432 Malignant neoplasm of lower lobe, left bronchus or lung: Secondary | ICD-10-CM | POA: Diagnosis not present

## 2021-05-13 DIAGNOSIS — R634 Abnormal weight loss: Secondary | ICD-10-CM | POA: Diagnosis not present

## 2021-05-13 DIAGNOSIS — I4892 Unspecified atrial flutter: Secondary | ICD-10-CM | POA: Diagnosis not present

## 2021-05-13 DIAGNOSIS — C3432 Malignant neoplasm of lower lobe, left bronchus or lung: Secondary | ICD-10-CM | POA: Diagnosis not present

## 2021-05-13 DIAGNOSIS — J449 Chronic obstructive pulmonary disease, unspecified: Secondary | ICD-10-CM | POA: Diagnosis not present

## 2021-05-13 DIAGNOSIS — J9610 Chronic respiratory failure, unspecified whether with hypoxia or hypercapnia: Secondary | ICD-10-CM | POA: Diagnosis not present

## 2021-05-13 DIAGNOSIS — I4891 Unspecified atrial fibrillation: Secondary | ICD-10-CM | POA: Diagnosis not present

## 2021-05-14 DIAGNOSIS — J449 Chronic obstructive pulmonary disease, unspecified: Secondary | ICD-10-CM | POA: Diagnosis not present

## 2021-05-14 DIAGNOSIS — I4891 Unspecified atrial fibrillation: Secondary | ICD-10-CM | POA: Diagnosis not present

## 2021-05-14 DIAGNOSIS — C3432 Malignant neoplasm of lower lobe, left bronchus or lung: Secondary | ICD-10-CM | POA: Diagnosis not present

## 2021-05-14 DIAGNOSIS — R634 Abnormal weight loss: Secondary | ICD-10-CM | POA: Diagnosis not present

## 2021-05-14 DIAGNOSIS — I4892 Unspecified atrial flutter: Secondary | ICD-10-CM | POA: Diagnosis not present

## 2021-05-14 DIAGNOSIS — J9610 Chronic respiratory failure, unspecified whether with hypoxia or hypercapnia: Secondary | ICD-10-CM | POA: Diagnosis not present

## 2021-05-15 DIAGNOSIS — J449 Chronic obstructive pulmonary disease, unspecified: Secondary | ICD-10-CM | POA: Diagnosis not present

## 2021-05-15 DIAGNOSIS — I4891 Unspecified atrial fibrillation: Secondary | ICD-10-CM | POA: Diagnosis not present

## 2021-05-15 DIAGNOSIS — R634 Abnormal weight loss: Secondary | ICD-10-CM | POA: Diagnosis not present

## 2021-05-15 DIAGNOSIS — C3432 Malignant neoplasm of lower lobe, left bronchus or lung: Secondary | ICD-10-CM | POA: Diagnosis not present

## 2021-05-15 DIAGNOSIS — I4892 Unspecified atrial flutter: Secondary | ICD-10-CM | POA: Diagnosis not present

## 2021-05-15 DIAGNOSIS — J9610 Chronic respiratory failure, unspecified whether with hypoxia or hypercapnia: Secondary | ICD-10-CM | POA: Diagnosis not present

## 2021-05-17 DIAGNOSIS — I4891 Unspecified atrial fibrillation: Secondary | ICD-10-CM | POA: Diagnosis not present

## 2021-05-17 DIAGNOSIS — J449 Chronic obstructive pulmonary disease, unspecified: Secondary | ICD-10-CM | POA: Diagnosis not present

## 2021-05-17 DIAGNOSIS — I4892 Unspecified atrial flutter: Secondary | ICD-10-CM | POA: Diagnosis not present

## 2021-05-17 DIAGNOSIS — C3432 Malignant neoplasm of lower lobe, left bronchus or lung: Secondary | ICD-10-CM | POA: Diagnosis not present

## 2021-05-17 DIAGNOSIS — J9610 Chronic respiratory failure, unspecified whether with hypoxia or hypercapnia: Secondary | ICD-10-CM | POA: Diagnosis not present

## 2021-05-17 DIAGNOSIS — R634 Abnormal weight loss: Secondary | ICD-10-CM | POA: Diagnosis not present

## 2021-05-18 DIAGNOSIS — J449 Chronic obstructive pulmonary disease, unspecified: Secondary | ICD-10-CM | POA: Diagnosis not present

## 2021-05-18 DIAGNOSIS — I4892 Unspecified atrial flutter: Secondary | ICD-10-CM | POA: Diagnosis not present

## 2021-05-18 DIAGNOSIS — C3432 Malignant neoplasm of lower lobe, left bronchus or lung: Secondary | ICD-10-CM | POA: Diagnosis not present

## 2021-05-18 DIAGNOSIS — R634 Abnormal weight loss: Secondary | ICD-10-CM | POA: Diagnosis not present

## 2021-05-18 DIAGNOSIS — I4891 Unspecified atrial fibrillation: Secondary | ICD-10-CM | POA: Diagnosis not present

## 2021-05-18 DIAGNOSIS — J9610 Chronic respiratory failure, unspecified whether with hypoxia or hypercapnia: Secondary | ICD-10-CM | POA: Diagnosis not present

## 2021-05-19 DIAGNOSIS — I4892 Unspecified atrial flutter: Secondary | ICD-10-CM | POA: Diagnosis not present

## 2021-05-19 DIAGNOSIS — J9610 Chronic respiratory failure, unspecified whether with hypoxia or hypercapnia: Secondary | ICD-10-CM | POA: Diagnosis not present

## 2021-05-19 DIAGNOSIS — C3432 Malignant neoplasm of lower lobe, left bronchus or lung: Secondary | ICD-10-CM | POA: Diagnosis not present

## 2021-05-19 DIAGNOSIS — J449 Chronic obstructive pulmonary disease, unspecified: Secondary | ICD-10-CM | POA: Diagnosis not present

## 2021-05-19 DIAGNOSIS — I4891 Unspecified atrial fibrillation: Secondary | ICD-10-CM | POA: Diagnosis not present

## 2021-05-19 DIAGNOSIS — R634 Abnormal weight loss: Secondary | ICD-10-CM | POA: Diagnosis not present

## 2021-05-20 DIAGNOSIS — I4891 Unspecified atrial fibrillation: Secondary | ICD-10-CM | POA: Diagnosis not present

## 2021-05-20 DIAGNOSIS — J449 Chronic obstructive pulmonary disease, unspecified: Secondary | ICD-10-CM | POA: Diagnosis not present

## 2021-05-20 DIAGNOSIS — C3432 Malignant neoplasm of lower lobe, left bronchus or lung: Secondary | ICD-10-CM | POA: Diagnosis not present

## 2021-05-20 DIAGNOSIS — I4892 Unspecified atrial flutter: Secondary | ICD-10-CM | POA: Diagnosis not present

## 2021-05-20 DIAGNOSIS — J9610 Chronic respiratory failure, unspecified whether with hypoxia or hypercapnia: Secondary | ICD-10-CM | POA: Diagnosis not present

## 2021-05-20 DIAGNOSIS — R634 Abnormal weight loss: Secondary | ICD-10-CM | POA: Diagnosis not present

## 2021-05-21 DIAGNOSIS — J9610 Chronic respiratory failure, unspecified whether with hypoxia or hypercapnia: Secondary | ICD-10-CM | POA: Diagnosis not present

## 2021-05-21 DIAGNOSIS — R634 Abnormal weight loss: Secondary | ICD-10-CM | POA: Diagnosis not present

## 2021-05-21 DIAGNOSIS — I4892 Unspecified atrial flutter: Secondary | ICD-10-CM | POA: Diagnosis not present

## 2021-05-21 DIAGNOSIS — C3432 Malignant neoplasm of lower lobe, left bronchus or lung: Secondary | ICD-10-CM | POA: Diagnosis not present

## 2021-05-21 DIAGNOSIS — I4891 Unspecified atrial fibrillation: Secondary | ICD-10-CM | POA: Diagnosis not present

## 2021-05-21 DIAGNOSIS — J449 Chronic obstructive pulmonary disease, unspecified: Secondary | ICD-10-CM | POA: Diagnosis not present

## 2021-05-22 DIAGNOSIS — C3432 Malignant neoplasm of lower lobe, left bronchus or lung: Secondary | ICD-10-CM | POA: Diagnosis not present

## 2021-05-22 DIAGNOSIS — I4891 Unspecified atrial fibrillation: Secondary | ICD-10-CM | POA: Diagnosis not present

## 2021-05-22 DIAGNOSIS — R634 Abnormal weight loss: Secondary | ICD-10-CM | POA: Diagnosis not present

## 2021-05-22 DIAGNOSIS — J449 Chronic obstructive pulmonary disease, unspecified: Secondary | ICD-10-CM | POA: Diagnosis not present

## 2021-05-22 DIAGNOSIS — I4892 Unspecified atrial flutter: Secondary | ICD-10-CM | POA: Diagnosis not present

## 2021-05-22 DIAGNOSIS — J9610 Chronic respiratory failure, unspecified whether with hypoxia or hypercapnia: Secondary | ICD-10-CM | POA: Diagnosis not present

## 2021-05-25 DEATH — deceased

## 2021-05-30 ENCOUNTER — Encounter: Payer: Self-pay | Admitting: *Deleted

## 2021-05-30 ENCOUNTER — Telehealth: Payer: Self-pay

## 2021-05-30 NOTE — Progress Notes (Signed)
Oncology Nurse Navigator Documentation ? ? ?  05/30/2021  ?  2:00 PM 03/27/2021  ? 10:00 AM 02/08/2021  ?  9:00 AM 02/01/2021  ?  9:00 AM 01/29/2021  ? 11:00 AM  ?Oncology Nurse Navigator Flowsheets  ?Abnormal Finding Date     10/25/2020  ?Confirmed Diagnosis Date     01/28/2021  ?Diagnosis Status    Confirmed Diagnosis Complete Pathology Pending  ?Planned Course of Treatment  Chemotherapy;Radiation  Chemo/Radiation Concurrent   ?Phase of Treatment  Chemo Radiation Radiation   ?Chemotherapy Actual Start Date:   02/11/2021    ?Chemotherapy Actual End Date:  03/19/2021     ?Radiation Actual Start Date:   02/04/2021    ?Radiation Actual End Date:  03/27/2021     ?Navigator Follow Up Date:  04/17/2021 02/26/2021 02/05/2021 02/01/2021  ?Navigator Follow Up Reason:  Follow-up Appointment Follow-up Appointment Appointment Review New Patient Appointment  ?Navigation Complete Date: 05/30/2021      ?Post Navigation: Continue to Follow Patient? No      ?Reason Not Navigating Patient: Hospice/Death      ?Navigator Location [facility]  ?Referral Date to RadOnc/MedOnc     01/28/2021  ?Navigator Encounter Type Other:/Received a message patient passed.  Closed navigation.  Clinic/MDC Other:;Appt/Treatment Plan Review Initial MedOnc Other:  ?Treatment Initiated Date   02/04/2021    ?Patient Visit Type  MedOnc Other Initial;MedOnc Other  ?Treatment Phase  Final Radiation Tx Pre-Tx/Tx Discussion Pre-Tx/Tx Discussion Pre-Tx/Tx Discussion  ?Barriers/Navigation Needs Coordination of Care Education Coordination of Care Coordination of Care;Education Coordination of Care  ?Education  Other  Newly Diagnosed Cancer Education;Other   ?Interventions Coordination of Care Education;Psycho-Social Support Coordination of Care Coordination of Care;Psycho-Social Support;Education Coordination of Care  ?Acuity Level 1-No Barriers Level 3-Moderate Needs (3-4 Barriers Identified) Level 2-Minimal  Needs (1-2 Barriers Identified) Level 4-High Needs (Greater Than 4 Barriers Identified) Level 3-Moderate Needs (3-4 Barriers Identified)  ?Coordination of Care   Other Appts Other  ?Education Method  Verbal  Verbal;Written   ?Time Spent with Patient 15 30 30  60 45  ?  ?

## 2021-05-30 NOTE — Telephone Encounter (Signed)
Pts wife called and advised scheduling that pt passed away 06-20-21. ?

## 2021-06-03 ENCOUNTER — Inpatient Hospital Stay: Payer: Medicare Other

## 2021-06-14 ENCOUNTER — Ambulatory Visit: Payer: Medicare Other | Admitting: Internal Medicine

## 2021-07-09 ENCOUNTER — Other Ambulatory Visit: Payer: Medicare Other

## 2021-07-11 ENCOUNTER — Ambulatory Visit: Payer: Medicare Other | Admitting: Internal Medicine
